# Patient Record
Sex: Female | Born: 1940 | Race: Black or African American | Hispanic: No | Smoking: Former smoker
Health system: Southern US, Community
[De-identification: ages and names within clinical notes are randomized; demographics above are authoritative.]

## PROBLEM LIST (undated history)

## (undated) DIAGNOSIS — E7800 Pure hypercholesterolemia, unspecified: Secondary | ICD-10-CM

## (undated) DIAGNOSIS — B3781 Candidal esophagitis: Secondary | ICD-10-CM

## (undated) DIAGNOSIS — I219 Acute myocardial infarction, unspecified: Secondary | ICD-10-CM

## (undated) DIAGNOSIS — M179 Osteoarthritis of knee, unspecified: Secondary | ICD-10-CM

## (undated) DIAGNOSIS — E119 Type 2 diabetes mellitus without complications: Secondary | ICD-10-CM

## (undated) DIAGNOSIS — I739 Peripheral vascular disease, unspecified: Secondary | ICD-10-CM

## (undated) DIAGNOSIS — K5790 Diverticulosis of intestine, part unspecified, without perforation or abscess without bleeding: Secondary | ICD-10-CM

## (undated) DIAGNOSIS — F419 Anxiety disorder, unspecified: Secondary | ICD-10-CM

## (undated) DIAGNOSIS — I779 Disorder of arteries and arterioles, unspecified: Secondary | ICD-10-CM

## (undated) DIAGNOSIS — D509 Iron deficiency anemia, unspecified: Secondary | ICD-10-CM

## (undated) DIAGNOSIS — K2270 Barrett's esophagus without dysplasia: Secondary | ICD-10-CM

## (undated) DIAGNOSIS — IMO0001 Reserved for inherently not codable concepts without codable children: Secondary | ICD-10-CM

## (undated) DIAGNOSIS — L409 Psoriasis, unspecified: Secondary | ICD-10-CM

## (undated) DIAGNOSIS — M459 Ankylosing spondylitis of unspecified sites in spine: Secondary | ICD-10-CM

## (undated) DIAGNOSIS — I998 Other disorder of circulatory system: Secondary | ICD-10-CM

## (undated) DIAGNOSIS — M1710 Unilateral primary osteoarthritis, unspecified knee: Secondary | ICD-10-CM

## (undated) DIAGNOSIS — Z5189 Encounter for other specified aftercare: Secondary | ICD-10-CM

## (undated) DIAGNOSIS — K2289 Other specified disease of esophagus: Secondary | ICD-10-CM

## (undated) DIAGNOSIS — Z720 Tobacco use: Secondary | ICD-10-CM

## (undated) DIAGNOSIS — R0600 Dyspnea, unspecified: Secondary | ICD-10-CM

## (undated) DIAGNOSIS — J42 Unspecified chronic bronchitis: Secondary | ICD-10-CM

## (undated) DIAGNOSIS — I2510 Atherosclerotic heart disease of native coronary artery without angina pectoris: Secondary | ICD-10-CM

## (undated) DIAGNOSIS — B351 Tinea unguium: Secondary | ICD-10-CM

## (undated) DIAGNOSIS — G8929 Other chronic pain: Secondary | ICD-10-CM

## (undated) DIAGNOSIS — K644 Residual hemorrhoidal skin tags: Secondary | ICD-10-CM

## (undated) DIAGNOSIS — Z9289 Personal history of other medical treatment: Secondary | ICD-10-CM

## (undated) DIAGNOSIS — K219 Gastro-esophageal reflux disease without esophagitis: Secondary | ICD-10-CM

## (undated) DIAGNOSIS — J449 Chronic obstructive pulmonary disease, unspecified: Secondary | ICD-10-CM

## (undated) DIAGNOSIS — K922 Gastrointestinal hemorrhage, unspecified: Secondary | ICD-10-CM

## (undated) DIAGNOSIS — I499 Cardiac arrhythmia, unspecified: Secondary | ICD-10-CM

## (undated) DIAGNOSIS — K449 Diaphragmatic hernia without obstruction or gangrene: Secondary | ICD-10-CM

## (undated) DIAGNOSIS — M1990 Unspecified osteoarthritis, unspecified site: Secondary | ICD-10-CM

## (undated) DIAGNOSIS — L97509 Non-pressure chronic ulcer of other part of unspecified foot with unspecified severity: Secondary | ICD-10-CM

## (undated) DIAGNOSIS — I313 Pericardial effusion (noninflammatory): Secondary | ICD-10-CM

## (undated) DIAGNOSIS — M549 Dorsalgia, unspecified: Secondary | ICD-10-CM

## (undated) DIAGNOSIS — G5601 Carpal tunnel syndrome, right upper limb: Secondary | ICD-10-CM

## (undated) DIAGNOSIS — R011 Cardiac murmur, unspecified: Secondary | ICD-10-CM

## (undated) DIAGNOSIS — L4050 Arthropathic psoriasis, unspecified: Secondary | ICD-10-CM

## (undated) DIAGNOSIS — M109 Gout, unspecified: Secondary | ICD-10-CM

## (undated) DIAGNOSIS — K228 Other specified diseases of esophagus: Secondary | ICD-10-CM

## (undated) DIAGNOSIS — I3139 Other pericardial effusion (noninflammatory): Secondary | ICD-10-CM

## (undated) DIAGNOSIS — D638 Anemia in other chronic diseases classified elsewhere: Principal | ICD-10-CM

## (undated) DIAGNOSIS — R51 Headache: Secondary | ICD-10-CM

## (undated) DIAGNOSIS — J189 Pneumonia, unspecified organism: Secondary | ICD-10-CM

## (undated) DIAGNOSIS — R0609 Other forms of dyspnea: Secondary | ICD-10-CM

## (undated) DIAGNOSIS — I10 Essential (primary) hypertension: Secondary | ICD-10-CM

## (undated) DIAGNOSIS — R519 Headache, unspecified: Secondary | ICD-10-CM

## (undated) DIAGNOSIS — K7460 Unspecified cirrhosis of liver: Secondary | ICD-10-CM

## (undated) DIAGNOSIS — J329 Chronic sinusitis, unspecified: Secondary | ICD-10-CM

## (undated) HISTORY — DX: Disorder of arteries and arterioles, unspecified: I77.9

## (undated) HISTORY — DX: Diverticulosis of intestine, part unspecified, without perforation or abscess without bleeding: K57.90

## (undated) HISTORY — PX: OOPHORECTOMY: SHX86

## (undated) HISTORY — PX: EYE SURGERY: SHX253

## (undated) HISTORY — DX: Pericardial effusion (noninflammatory): I31.3

## (undated) HISTORY — DX: Gastrointestinal hemorrhage, unspecified: K92.2

## (undated) HISTORY — DX: Unspecified cirrhosis of liver: K74.60

## (undated) HISTORY — PX: APPENDECTOMY: SHX54

## (undated) HISTORY — PX: CORONARY ANGIOPLASTY: SHX604

## (undated) HISTORY — DX: Personal history of other medical treatment: Z92.89

## (undated) HISTORY — DX: Anemia in other chronic diseases classified elsewhere: D63.8

## (undated) HISTORY — DX: Peripheral vascular disease, unspecified: I73.9

## (undated) HISTORY — DX: Candidal esophagitis: B37.81

## (undated) HISTORY — PX: CATARACT EXTRACTION W/ INTRAOCULAR LENS IMPLANT: SHX1309

## (undated) HISTORY — DX: Tinea unguium: B35.1

## (undated) HISTORY — DX: Other pericardial effusion (noninflammatory): I31.39

---

## 1974-11-28 HISTORY — PX: ABDOMINAL HYSTERECTOMY: SHX81

## 1998-07-07 ENCOUNTER — Ambulatory Visit (HOSPITAL_COMMUNITY): Admission: RE | Admit: 1998-07-07 | Discharge: 1998-07-07 | Payer: Self-pay | Admitting: Dermatology

## 1998-10-02 ENCOUNTER — Ambulatory Visit (HOSPITAL_COMMUNITY): Admission: RE | Admit: 1998-10-02 | Discharge: 1998-10-02 | Payer: Self-pay | Admitting: Pulmonary Disease

## 1998-12-10 ENCOUNTER — Inpatient Hospital Stay (HOSPITAL_COMMUNITY): Admission: EM | Admit: 1998-12-10 | Discharge: 1998-12-16 | Payer: Self-pay | Admitting: Emergency Medicine

## 1998-12-10 ENCOUNTER — Encounter: Payer: Self-pay | Admitting: Pulmonary Disease

## 1998-12-12 ENCOUNTER — Encounter: Payer: Self-pay | Admitting: Pulmonary Disease

## 1998-12-16 ENCOUNTER — Encounter: Payer: Self-pay | Admitting: Pulmonary Disease

## 1999-01-06 ENCOUNTER — Ambulatory Visit (HOSPITAL_COMMUNITY): Admission: RE | Admit: 1999-01-06 | Discharge: 1999-01-06 | Payer: Self-pay | Admitting: Gastroenterology

## 1999-01-06 ENCOUNTER — Encounter: Payer: Self-pay | Admitting: Gastroenterology

## 1999-01-26 ENCOUNTER — Ambulatory Visit (HOSPITAL_COMMUNITY): Admission: RE | Admit: 1999-01-26 | Discharge: 1999-01-26 | Payer: Self-pay | Admitting: Pulmonary Disease

## 2000-07-27 ENCOUNTER — Encounter: Payer: Self-pay | Admitting: Pulmonary Disease

## 2000-07-27 ENCOUNTER — Ambulatory Visit (HOSPITAL_COMMUNITY): Admission: RE | Admit: 2000-07-27 | Discharge: 2000-07-27 | Payer: Self-pay | Admitting: Pulmonary Disease

## 2000-09-28 ENCOUNTER — Ambulatory Visit (HOSPITAL_COMMUNITY): Admission: RE | Admit: 2000-09-28 | Discharge: 2000-09-28 | Payer: Self-pay | Admitting: Pulmonary Disease

## 2000-12-19 ENCOUNTER — Ambulatory Visit (HOSPITAL_COMMUNITY): Admission: RE | Admit: 2000-12-19 | Discharge: 2000-12-19 | Payer: Self-pay | Admitting: Pulmonary Disease

## 2000-12-19 ENCOUNTER — Encounter: Payer: Self-pay | Admitting: Pulmonary Disease

## 2001-02-13 ENCOUNTER — Encounter: Payer: Self-pay | Admitting: Pulmonary Disease

## 2001-02-13 ENCOUNTER — Ambulatory Visit (HOSPITAL_COMMUNITY): Admission: RE | Admit: 2001-02-13 | Discharge: 2001-02-13 | Payer: Self-pay | Admitting: Pulmonary Disease

## 2003-10-27 ENCOUNTER — Ambulatory Visit (HOSPITAL_COMMUNITY): Admission: RE | Admit: 2003-10-27 | Discharge: 2003-10-27 | Payer: Self-pay | Admitting: Pulmonary Disease

## 2005-11-28 HISTORY — PX: COLONOSCOPY: SHX174

## 2006-06-23 ENCOUNTER — Ambulatory Visit (HOSPITAL_COMMUNITY): Admission: RE | Admit: 2006-06-23 | Discharge: 2006-06-23 | Payer: Self-pay | Admitting: Pulmonary Disease

## 2006-08-13 ENCOUNTER — Inpatient Hospital Stay (HOSPITAL_COMMUNITY): Admission: EM | Admit: 2006-08-13 | Discharge: 2006-08-17 | Payer: Self-pay | Admitting: *Deleted

## 2006-09-13 ENCOUNTER — Encounter: Admission: RE | Admit: 2006-09-13 | Discharge: 2006-09-13 | Payer: Self-pay | Admitting: Cardiology

## 2006-09-13 ENCOUNTER — Ambulatory Visit (HOSPITAL_COMMUNITY): Admission: RE | Admit: 2006-09-13 | Discharge: 2006-09-13 | Payer: Self-pay | Admitting: Nephrology

## 2006-10-03 ENCOUNTER — Ambulatory Visit: Payer: Self-pay | Admitting: Gastroenterology

## 2006-10-10 ENCOUNTER — Other Ambulatory Visit: Admission: RE | Admit: 2006-10-10 | Discharge: 2006-10-10 | Payer: Self-pay | Admitting: Obstetrics and Gynecology

## 2006-11-01 ENCOUNTER — Ambulatory Visit: Payer: Self-pay | Admitting: Gastroenterology

## 2006-11-01 ENCOUNTER — Encounter (INDEPENDENT_AMBULATORY_CARE_PROVIDER_SITE_OTHER): Payer: Self-pay | Admitting: *Deleted

## 2006-12-05 ENCOUNTER — Ambulatory Visit: Payer: Self-pay | Admitting: Gastroenterology

## 2007-01-31 ENCOUNTER — Ambulatory Visit (HOSPITAL_COMMUNITY): Admission: RE | Admit: 2007-01-31 | Discharge: 2007-01-31 | Payer: Self-pay | Admitting: Pulmonary Disease

## 2007-11-16 ENCOUNTER — Ambulatory Visit (HOSPITAL_COMMUNITY): Admission: RE | Admit: 2007-11-16 | Discharge: 2007-11-16 | Payer: Self-pay | Admitting: Ophthalmology

## 2008-01-25 ENCOUNTER — Encounter (INDEPENDENT_AMBULATORY_CARE_PROVIDER_SITE_OTHER): Payer: Self-pay | Admitting: Surgery

## 2008-01-25 ENCOUNTER — Ambulatory Visit (HOSPITAL_COMMUNITY): Admission: RE | Admit: 2008-01-25 | Discharge: 2008-01-25 | Payer: Self-pay | Admitting: Surgery

## 2008-04-28 ENCOUNTER — Encounter: Payer: Self-pay | Admitting: Ophthalmology

## 2008-05-07 ENCOUNTER — Ambulatory Visit (HOSPITAL_COMMUNITY): Admission: RE | Admit: 2008-05-07 | Discharge: 2008-05-08 | Payer: Self-pay | Admitting: Ophthalmology

## 2008-11-24 ENCOUNTER — Encounter: Payer: Self-pay | Admitting: Gastroenterology

## 2008-11-28 HISTORY — PX: LIPOMA EXCISION: SHX5283

## 2008-12-11 ENCOUNTER — Encounter: Payer: Self-pay | Admitting: Endocrinology

## 2009-03-23 ENCOUNTER — Ambulatory Visit (HOSPITAL_COMMUNITY): Admission: RE | Admit: 2009-03-23 | Discharge: 2009-03-23 | Payer: Self-pay | Admitting: Ophthalmology

## 2009-05-19 ENCOUNTER — Encounter: Payer: Self-pay | Admitting: Endocrinology

## 2009-05-21 ENCOUNTER — Ambulatory Visit (HOSPITAL_COMMUNITY): Admission: RE | Admit: 2009-05-21 | Discharge: 2009-05-21 | Payer: Self-pay | Admitting: Pulmonary Disease

## 2009-06-11 ENCOUNTER — Ambulatory Visit (HOSPITAL_COMMUNITY): Admission: RE | Admit: 2009-06-11 | Discharge: 2009-06-11 | Payer: Self-pay | Admitting: Pulmonary Disease

## 2009-07-24 ENCOUNTER — Encounter: Admission: RE | Admit: 2009-07-24 | Discharge: 2009-07-24 | Payer: Self-pay | Admitting: Otolaryngology

## 2009-08-05 ENCOUNTER — Ambulatory Visit: Payer: Self-pay | Admitting: Endocrinology

## 2009-08-05 DIAGNOSIS — K219 Gastro-esophageal reflux disease without esophagitis: Secondary | ICD-10-CM

## 2009-08-05 DIAGNOSIS — E119 Type 2 diabetes mellitus without complications: Secondary | ICD-10-CM

## 2009-08-05 DIAGNOSIS — E079 Disorder of thyroid, unspecified: Secondary | ICD-10-CM | POA: Insufficient documentation

## 2009-08-05 DIAGNOSIS — R011 Cardiac murmur, unspecified: Secondary | ICD-10-CM

## 2009-08-05 DIAGNOSIS — L408 Other psoriasis: Secondary | ICD-10-CM

## 2009-08-05 DIAGNOSIS — R209 Unspecified disturbances of skin sensation: Secondary | ICD-10-CM | POA: Insufficient documentation

## 2009-08-05 DIAGNOSIS — E279 Disorder of adrenal gland, unspecified: Secondary | ICD-10-CM | POA: Insufficient documentation

## 2009-08-05 LAB — CONVERTED CEMR LAB
Folate: 11.3 ng/mL
Free T4: 0.6 ng/dL (ref 0.6–1.6)
Hgb A1c MFr Bld: 6.7 % — ABNORMAL HIGH (ref 4.6–6.5)
T3, Free: 3.2 pg/mL (ref 2.3–4.2)
TSH: 0.78 microintl units/mL (ref 0.35–5.50)
Vitamin B-12: 911 pg/mL (ref 211–911)

## 2009-09-17 ENCOUNTER — Encounter: Admission: RE | Admit: 2009-09-17 | Discharge: 2009-09-17 | Payer: Self-pay | Admitting: Otolaryngology

## 2009-09-25 ENCOUNTER — Encounter: Admission: RE | Admit: 2009-09-25 | Discharge: 2009-09-25 | Payer: Self-pay | Admitting: Otolaryngology

## 2009-09-29 ENCOUNTER — Encounter (INDEPENDENT_AMBULATORY_CARE_PROVIDER_SITE_OTHER): Payer: Self-pay | Admitting: Otolaryngology

## 2009-09-29 ENCOUNTER — Ambulatory Visit (HOSPITAL_BASED_OUTPATIENT_CLINIC_OR_DEPARTMENT_OTHER): Admission: RE | Admit: 2009-09-29 | Discharge: 2009-09-29 | Payer: Self-pay | Admitting: Otolaryngology

## 2009-09-29 HISTORY — PX: NASAL SINUS SURGERY: SHX719

## 2009-10-01 ENCOUNTER — Telehealth: Payer: Self-pay | Admitting: Gastroenterology

## 2009-10-09 ENCOUNTER — Telehealth (INDEPENDENT_AMBULATORY_CARE_PROVIDER_SITE_OTHER): Payer: Self-pay | Admitting: *Deleted

## 2009-11-19 ENCOUNTER — Ambulatory Visit (HOSPITAL_COMMUNITY): Admission: RE | Admit: 2009-11-19 | Discharge: 2009-11-19 | Payer: Self-pay | Admitting: Pulmonary Disease

## 2009-11-25 ENCOUNTER — Encounter: Payer: Self-pay | Admitting: Internal Medicine

## 2009-11-25 ENCOUNTER — Ambulatory Visit (HOSPITAL_COMMUNITY): Admission: RE | Admit: 2009-11-25 | Discharge: 2009-11-25 | Payer: Self-pay | Admitting: Pulmonary Disease

## 2009-11-28 HISTORY — PX: ESOPHAGOGASTRODUODENOSCOPY: SHX1529

## 2009-11-28 HISTORY — PX: CARPAL TUNNEL RELEASE: SHX101

## 2010-02-08 ENCOUNTER — Encounter: Payer: Self-pay | Admitting: Gastroenterology

## 2010-02-09 ENCOUNTER — Telehealth: Payer: Self-pay | Admitting: Gastroenterology

## 2010-02-17 ENCOUNTER — Ambulatory Visit: Payer: Self-pay | Admitting: Gastroenterology

## 2010-02-17 ENCOUNTER — Encounter (INDEPENDENT_AMBULATORY_CARE_PROVIDER_SITE_OTHER): Payer: Self-pay | Admitting: *Deleted

## 2010-02-17 DIAGNOSIS — R1310 Dysphagia, unspecified: Secondary | ICD-10-CM | POA: Insufficient documentation

## 2010-02-17 DIAGNOSIS — D509 Iron deficiency anemia, unspecified: Secondary | ICD-10-CM | POA: Insufficient documentation

## 2010-02-17 LAB — CONVERTED CEMR LAB
Basophils Absolute: 0 K/uL
Basophils Relative: 0.1 %
Eosinophils Absolute: 0.1 K/uL
Eosinophils Relative: 2.1 %
Ferritin: 13.2 ng/mL
Folate: 6.3 ng/mL
HCT: 34.6 % — ABNORMAL LOW
Hemoglobin: 11.1 g/dL — ABNORMAL LOW
Iron: 49 ug/dL
Lymphocytes Relative: 16 %
Lymphs Abs: 0.9 K/uL
MCHC: 32.1 g/dL
MCV: 87.8 fL
Monocytes Absolute: 0.4 K/uL
Monocytes Relative: 6.1 %
Neutro Abs: 4.4 K/uL
Neutrophils Relative %: 75.7 %
Platelets: 176 K/uL
RBC: 3.94 M/uL
RDW: 20.1 % — ABNORMAL HIGH
Saturation Ratios: 11.7 % — ABNORMAL LOW
Transferrin: 298.8 mg/dL
Vitamin B-12: 437 pg/mL
WBC: 5.8 10*3/microliter

## 2010-02-19 ENCOUNTER — Ambulatory Visit: Payer: Self-pay | Admitting: Gastroenterology

## 2010-02-23 ENCOUNTER — Telehealth: Payer: Self-pay | Admitting: Gastroenterology

## 2010-03-08 ENCOUNTER — Ambulatory Visit: Payer: Self-pay | Admitting: Gastroenterology

## 2010-03-08 LAB — CONVERTED CEMR LAB: Fecal Occult Bld: NEGATIVE

## 2010-03-31 ENCOUNTER — Telehealth: Payer: Self-pay | Admitting: Gastroenterology

## 2010-04-16 ENCOUNTER — Encounter: Admission: RE | Admit: 2010-04-16 | Discharge: 2010-04-16 | Payer: Self-pay | Admitting: Otolaryngology

## 2010-04-27 ENCOUNTER — Ambulatory Visit: Payer: Self-pay | Admitting: Gastroenterology

## 2010-04-27 ENCOUNTER — Encounter (INDEPENDENT_AMBULATORY_CARE_PROVIDER_SITE_OTHER): Payer: Self-pay | Admitting: *Deleted

## 2010-04-27 DIAGNOSIS — J449 Chronic obstructive pulmonary disease, unspecified: Secondary | ICD-10-CM | POA: Insufficient documentation

## 2010-04-27 DIAGNOSIS — J4489 Other specified chronic obstructive pulmonary disease: Secondary | ICD-10-CM | POA: Insufficient documentation

## 2010-04-30 ENCOUNTER — Ambulatory Visit: Payer: Self-pay | Admitting: Internal Medicine

## 2010-04-30 DIAGNOSIS — I10 Essential (primary) hypertension: Secondary | ICD-10-CM

## 2010-04-30 DIAGNOSIS — J45909 Unspecified asthma, uncomplicated: Secondary | ICD-10-CM | POA: Insufficient documentation

## 2010-05-05 ENCOUNTER — Ambulatory Visit: Payer: Self-pay | Admitting: Gastroenterology

## 2010-05-17 ENCOUNTER — Encounter: Payer: Self-pay | Admitting: Gastroenterology

## 2010-05-25 ENCOUNTER — Ambulatory Visit: Payer: Self-pay | Admitting: Gastroenterology

## 2010-05-26 LAB — CONVERTED CEMR LAB
Basophils Relative: 0.4 % (ref 0.0–3.0)
Eosinophils Absolute: 0.3 10*3/uL (ref 0.0–0.7)
Folate: 13.8 ng/mL
Iron: 130 ug/dL (ref 42–145)
Lymphocytes Relative: 27.2 % (ref 12.0–46.0)
MCHC: 33.4 g/dL (ref 30.0–36.0)
MCV: 96.6 fL (ref 78.0–100.0)
Monocytes Absolute: 0.9 10*3/uL (ref 0.1–1.0)
Neutrophils Relative %: 56.5 % (ref 43.0–77.0)
Platelets: 157 10*3/uL (ref 150.0–400.0)
RBC: 4.27 M/uL (ref 3.87–5.11)
Transferrin: 226.4 mg/dL (ref 212.0–360.0)
WBC: 7.9 10*3/uL (ref 4.5–10.5)

## 2010-09-14 ENCOUNTER — Ambulatory Visit (HOSPITAL_COMMUNITY): Admission: RE | Admit: 2010-09-14 | Discharge: 2010-09-14 | Payer: Self-pay | Admitting: Pulmonary Disease

## 2010-12-30 NOTE — Assessment & Plan Note (Signed)
Summary: Pulmonary/ copd eval > try off ace then return for pfts   Visit Type:  Initial Consult Copy to:  Dr. Rolanda Jay Primary Provider/Referring Provider:  Corine Shelter, MD  CC:  Dyspnea.  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  April 30, 2010 cc episodes of throat tightness esp since Mother's day assoc with dysphagia and sore throat started after sinus surgery November 2010 but gradually worsening in terms of severity and frequency so saw Dr Jarold Motto who rec egd and pulmonary eval. She has minimal sputum production and more day than night time events.  some better after uses neb but not consisently. Pt denies any significant   itching, sneezing,  nasal congestion or excess/purulent secretions,  fever, chills, sweats, unintended wt loss, pleuritic or exertional cp, hempoptysis, change in activity tolerance  orthopnea pnd or leg swelling. Pt also denies any obvious fluctuation in symptoms with weather or environmental change or other alleviating or aggravating factors.       Current Medications (verified): 1)  Flonase 50 Mcg/act Susp (Fluticasone Propionate) .... As Needed 2)  Theophylline Cr 200 Mg Xr12h-Tab (Theophylline) .Marland Kitchen.. 1 Tab Two Times A Day 3)  Glucovance 2.5-500 Mg Tabs (Glyburide-Metformin) .Marland Kitchen.. 1 Tab Two Times A Day As Needed On Hold Until Bs Increases 4)  Lotrel 10-40 Mg Caps (Amlodipine Besy-Benazepril Hcl) .Marland Kitchen.. 1 Cap Every Evening 5)  Benicar Hct 40-12.5 Mg Tabs (Olmesartan Medoxomil-Hctz) .Marland Kitchen.. 1 Tab Every Morning 6)  Prednisone 5 Mg Tabs (Prednisone) .Marland Kitchen.. 1 Tab On M,w,fri 7)  Travatan 0.004 % Soln (Travoprost) .... Daily 8)  Premarin 0.3 Mg Tabs (Estrogens Conjugated) .Marland Kitchen.. 1 Tab Daily 9)  Nexium 40 Mg Cpdr (Esomeprazole Magnesium) .Marland Kitchen.. 1 Tab Every Morning  30 Minutes Before Breakfast 10)  Advair Diskus 250-50 Mcg/dose Aepb (Fluticasone-Salmeterol) .Marland Kitchen.. 1 Puff Two Times A Day 11)  Ranitidine Hcl 150 Mg Caps (Ranitidine Hcl) .... Take 1 Capsule By Mouth Once A Day 12)  Aspirin 81 Mg Tbec (Aspirin) .Marland Kitchen.. 1 Tab By Mouth Once Daily 13)  Humira 40 Mg/0.37ml Kit (Adalimumab) .... Take Every 2 Weeks 14)  Icaps  Caps (Multiple Vitamins-Minerals) .Marland Kitchen.. 1 By Mouth Two Times A Day 15)  Acular 0.5 % Soln (Ketorolac Tromethamine) .... Use As Directed 16)  Albuterol Sulfate (2.5 Mg/35ml) 0.083% Nebu (Albuterol Sulfate) .... Use As Needed 17)  Tandem 162-115.2 Mg Caps (Ferrous Fum-Iron Polysacch) .... Take 1 Capsule By Mouth Once A Day  Allergies (verified): No Known Drug Allergies  Past History:  Past Medical History: HEART MURMUR (ICD-785.2) GERD (ICD-530.81) DIABETES-TYPE 2 (ICD-250.00) Hypertension     - Try off ACE April 30, 2010  Asthma/COPD    - PFT's rec April 30, 2010   Past Surgical History: hysterectomy-1976 appendectomy- 1976 Eye surgery  implant lt. eye and rt. eye occipital nodule removal Cyst in upper rt. eye 09/2009  Family History: sister has uncertain type of thyroid dz No FH of Colon Cancer: Sinus Cancer-mother Negative for respiratory diseases or atopy   Social History: widowed 1995 retired Engineer, civil (consulting) Former smoker.  Quit in March 2011.  Smoked for 55 yrs up to 1/2 ppd. Alcohol Use - no Daily Caffeine Use  Review of Systems       The patient complains of shortness of breath with activity, productive  cough, acid heartburn, indigestion, loss of appetite, difficulty swallowing, sore throat, tooth/dental problems, nasal congestion/difficulty breathing through nose, sneezing, itching, ear ache, anxiety, hand/feet swelling, and joint stiffness or pain.  The patient denies shortness of breath at rest, non-productive cough, coughing up blood, chest pain, irregular heartbeats, weight change, abdominal pain, headaches, depression, rash,  change in color of mucus, and fever.    Vital Signs:  Patient profile:   70 year old female Weight:      188 pounds O2 Sat:      96 % on Room air Temp:     98.2 degrees F oral Pulse rate:   77 / minute BP sitting:   130 / 74  (left arm)  Vitals Entered By: Vernie Murders (April 30, 2010 10:02 AM)  O2 Flow:  Room air  Physical Exam  Additional Exam:  wt 187 > 188 April 30, 2010 amb hoarse bf with classic pseudowheeze resolves with purse lip maneuver HEENT mild turbinate edema.  Oropharynx no thrush or excess pnd or cobblestoning.  No JVD or cervical adenopathy. Mild accessory muscle hypertrophy. Trachea midline, nl thryroid. Chest was hyperinflated by percussion with diminished breath sounds and moderate increased exp time without wheeze. Hoover sign positive at mid inspiration. Regular rate and rhythm without murmur gallop or rub or increase P2 or edema.  Abd: no hsm, nl excursion. Ext warm without cyanosis or clubbing.     Impression & Recommendations:  Problem # 1:  COPD (ICD-496) ? severity, pft's needed DDX of  difficult airways managment all start with A and  include Adherence, Ace Inhibitors, Acid Reflux, Active Sinus Disease, Alpha 1 Antitripsin deficiency, Anxiety masquerading as Airways dz,  ABPA,  allergy(esp in young), Aspiration (esp in elderly), Adverse effects of DPI,  Active smokers, plus one B  = Beta blocker use..   Acid reflux and Ace inhibitors in pts like this can be like gasoline and matches.  Rec trial off ace then also consider consolidate rx to spriva and no theoph or advair once returns for baselline pfts'   Each maintenance medication was reviewed in detail including most importantly the difference between maintenance and as needed and under what circumstances the prns are to be used.   Problem # 2:  HYPERTENSION (ICD-401.9)  The following medications were removed from the medication list:    Lotrel 10-40 Mg Caps (Amlodipine besy-benazepril hcl) .Marland Kitchen... 1 cap  every evening Her updated medication list for this problem includes:    Benicar Hct 40-12.5 Mg Tabs (Olmesartan medoxomil-hctz) .Marland Kitchen... 1 tab every morning   ACE inhibitors are problematic in  pts with airway complaints because  even experienced pulmonologists can't always distinguish ace effects from copd/asthma.  By themselves they don't actually cause a problem, much like oxygen can't by itself start a fire, but they certainly serve as a powerful catalyst or enhancer for any "fire"  or inflammatory process in the upper airway, be it caused by an ET  tube or more commonly reflux (especially in the obese or pts with known GERD or who are on biphoshonates).  In the era of ARB near equivalency until we have a better handle on the reversibility of the airway problem, it just makes sense to avoid ace entirely in the short run and then decide later, having established a level of airway control using a reasonable limited regimen, whether to add back ace but even then being very careful to observe the pt for worsening airway control and number of  meds used/ needed to control symptoms.    Orders: New Patient Level V (04540)  Patient Instructions: 1)  Stop Lotrel - if blood pressure too high take the benicar twice daily 2)  Return in 4 weeks for PFT's and cxr

## 2010-12-30 NOTE — Progress Notes (Signed)
Summary: results   Phone Note Call from Patient Call back at Home Phone [phone]   Caller: Patient Call For: Jarold Motto Reason for Call: Talk to Nurse, Lab or Test Results Summary of Call: Patient wants lab results Initial call taken by: Tawni Levy,  Mar 31, 2010 9:03 AM  Follow-up for Phone Call        Pt notified of negative hemacult. Follow-up by: Ashok Cordia RN,  Mar 31, 2010 10:03 AM

## 2010-12-30 NOTE — Procedures (Signed)
Summary: Upper Endoscopy  Patient: [name] Note: All result statuses are Final unless otherwise noted.  Tests: (1) Upper Endoscopy (EGD)   EGD Upper Endoscopy       DONE     [facility]     [address]     [place], [place]  [address]           ENDOSCOPY PROCEDURE REPORT           PATIENT:  [name], [name]  MR#:  [id number]     BIRTHDATE:  [birth year], 69 yrs. old  GENDER:  female           ENDOSCOPIST:  Vania Rea. Jarold Motto, MD, [facility]     Referred by:           PROCEDURE DATE:  05/05/2010     PROCEDURE:  EGD with biopsy     ASA CLASS:  Class III     INDICATIONS:  dysphagia           MEDICATIONS:   Fentanyl 50 mcg IV, Versed 5 mg IV     TOPICAL ANESTHETIC:  Exactacain Spray           DESCRIPTION OF PROCEDURE:   After the risks benefits and     alternatives of the procedure were thoroughly explained, informed     consent was obtained.  [facility] GIF-H180 [id number] endoscope was     introduced through the mouth and advanced to the second portion of     the duodenum, without limitations.  The instrument was slowly     withdrawn as the mucosa was fully examined.     <<PROCEDUREIMAGES>>           Esophagitis was found. DENSE CANDIDA PLAQUES IN SISTAL HALF OF     ESOPHAGUS BIOPSIED.BACKGROUND BARRETT'S MUCOSA AND ALSO NOTED ARE     2 INLET POUCH AREAS IN UPPER 1/3.SEE PICTURES.  A hiatal hernia     was found.  Normal duodenal folds were noted.  The stomach was     entered and closely examined. The antrum, angularis, and lesser     curvature were well visualized, including a retroflexed view of     the cardia and fundus. The stomach wall was normally distensable.     The scope passed easily through the pylorus into the duodenum.     Retroflexed views revealed a hiatal hernia.    The scope was then     withdrawn from the patient and the procedure completed.           COMPLICATIONS:  None           ENDOSCOPIC IMPRESSION:     1) Esophagitis     2) Hiatal hernia  3) Normal duodenal folds     4) Normal stomach     5) A hiatal hernia     SEVERE CANDIDA ESOPHAGITIS     RECOMMENDATIONS:     1) Await biopsy results     DIFLUCAN 100 MG BID FOR 1 DAY,THEN DAILY FOR 2 WEEKS.HOLD     NEXIUM.SEE ME 3 WEEKS.           REPEAT EXAM:  No           ______________________________     Vania Rea. Jarold Motto, MD, [facility]           CC:  Corine Shelter MD           n.     Rosalie DoctorOnalee Hua  Hale Bogus at 05/05/2010 02:33 PM           Genella Rife, [id number]  Note: An exclamation mark (!) indicates a result that was not dispersed into the flowsheet. Document Creation Date: 05/05/2010 2:34 PM _______________________________________________________________________  (1) Order result status: Final Collection or observation date-time: 05/05/2010 14:15 Requested date-time:  Receipt date-time:  Reported date-time:  Referring Physician:   Ordering Physician: Sheryn Bison [phone]) Specimen Source:  Source: Launa Grill Order Number: [id number] Lab site:

## 2010-12-30 NOTE — Letter (Signed)
Summary: Patient [facility] Biopsy Results  [facility]  [address] [place], [place] [address]   Phone: [phone]  Fax: [phone]        May 17, 2010 MRN: [id number]    [facility] [address] [place], [place]  [address]    Dear Ms. Summerall,  I am pleased to inform you that the biopsies taken during your recent endoscopic examination did not show any evidence of cancer upon pathologic examination.  Additional information/recommendations:  __No further action is needed at this time.  Please follow-up with      your primary care physician for your other healthcare needs.  __ Please call [phone] to schedule a return visit to review      your condition.  xx__ Continue with the treatment plan as outlined on the day of your      exam.  __ You should have a repeat endoscopic examination for this problem              in _ months/years.   Please call us if you are having persistent problems or have questions about your condition that have not been fully answered at this time.  Sincerely,  Mardella Layman MD [facility]  This letter has been electronically signed by your physician.  Appended Document: Patient Notice-Endo Biopsy Results letter mailed.

## 2010-12-30 NOTE — Miscellaneous (Signed)
Summary: gi med  Clinical Lists Changes  Medications: Added new medication of DIFLUCAN 100 MG  TABS (FLUCONAZOLE) two times a day for one day, then daily for 3 weeks. - Signed Rx of DIFLUCAN 100 MG  TABS (FLUCONAZOLE) two times a day for one day, then daily for 3 weeks.;  #23 x 0;  Signed;  Entered by: Eual Fines RN;  Authorized by: Mardella Layman MD [facility];  Method used: [facility] [address] [phone]*, [address] [place], [place], [place]  [id number], Ph: [phone] or [phone], Fax: [phone] Observations: Added new observation of NKA: T (05/05/2010 14:44)    Prescriptions: DIFLUCAN 100 MG  TABS (FLUCONAZOLE) two times a day for one day, then daily for 3 weeks.  #23 x 0   Entered by:   Eual Fines RN   Authorized by:   Mardella Layman MD [facility]   Signed by:   Eual Fines RN on 05/05/2010   Method used:   [facility] [phone]* (retail)       [address]       [place], [place]  [id number]       Ph: [phone] or [phone]       Fax: [phone]   RxID:   [id number]

## 2010-12-30 NOTE — Procedures (Signed)
Summary:  EGD and Path   EGD  Procedure date:  11/01/2006  Findings:      Location: [facility]    EGD  Procedure date:  11/01/2006  Findings:      Location: [facility]   Patient Name: [name], Shelby C. MRN:  Procedure Procedures: Panendoscopy (EGD) CPT: 43235.    with esophageal dilation. CPT: G9296129.  Personnel: Endoscopist: Vania Rea. Jarold Motto, MD.  Exam Location: Exam performed in Outpatient Clinic. Outpatient  Patient Consent: Procedure, Alternatives, Risks and Benefits discussed, consent obtained, from patient. Consent was obtained by the RN.  Indications Symptoms: Dysphagia. Reflux symptoms  History  Current Medications: Patient is not currently taking Coumadin.  Medical/Surgical History: Adult Onset Diabetes, Hypertension, Psoriasis, COPD,  Pre-Exam Physical: Performed Nov 01, 2006  Cardio-pulmonary exam, Abdominal exam, Extremity exam, Mental status exam WNL.  Comments: Pt. history reviewed/updated, physical exam performed prior to initiation of sedation?yes Exam Exam Info: Maximum depth of insertion Duodenum, intended Duodenum. Patient position: on left side. Duration of exam: 10 minutes. Vocal cords visualized. Gastric retroflexion performed. Images taken. ASA Classification: III. Tolerance: excellent.  Sedation Meds: Patient assessed and found to be appropriate for moderate (conscious) sedation. Cetacaine Spray 2 sprays given aerosolized. Versed 4 mg. given IV.  Monitoring: BP and pulse monitoring done. Oximetry used. Supplemental O2 given at 2 Liters.  Instrument(s): GIF 160. Serial [id number].   Findings - HIATAL HERNIA: Prolapsing, 4 cms. in length. ICD9: Hernia, Hiatal: 553.3. - ESOPHAGEAL INFLAMMATION: as a result of candida. Length of inflammation: 8 cm. Biopsy/Esoph Inflamtn taken. ICD9: Esophagitis: 530.10.  - Dilation: Distal Esophagus. Maloney dilator used, Diameter: 54 F, No Resistance, No Heme  present on extraction. 1  total dilators used. Patient tolerance excellent. Outcome: successful.  - Normal: Fundus to Duodenal 2nd Portion. Not Seen: Tumor. Ulcer. Mucosal abnormality. AVM's. Foreign body. Polyp. Varices.   Assessment  Diagnoses: 530.10: Esophagitis.  553.3: Hernia, Hiatal.   Events  Unplanned Intervention: No unplanned interventions were required.  Plans Medication(s): Other: Diflucan 100mg . QAM, starting Nov 01, 2006 for 10 days.   Disposition: After procedure patient sent to recovery. After recovery patient sent home.  Scheduling: Await pathology to schedule patient. Follow-up prn.    cc: Corine Shelter, MD    [facility] Surgical Pathology - STATUS: Final             By: Gwenlyn Saran  ,        Perform Date: 5 Dec07 00:01  Ordered By: Juanetta Beets        Ordered Date: 6 Dec07 09:02  Facility: [facility]                               Department: CPATH  Service Report Text  [facility]   [address] [address]   [place], [place] [address]   Telephone [phone] or [phone] Fax [phone]    REPORT OF SURGICAL PATHOLOGY    Case #: [id number]   Patient Name: [name], Shelby C.   Office Chart Number: N/A    MRN: [id number]   Pathologist: Berneta Levins, MD   DOB/Age February 05, 1941 (Age: 70) Gender: F   Date Taken: 11/01/2006   Date Received: 11/01/2006    FINAL DIAGNOSIS    ***MICROSCOPIC EXAMINATION AND DIAGNOSIS***    ESOPHAGUS, BIOPSY: BENIGN SQUAMOUS MUCOSA WITH FOCAL   PARAKERATOSIS. NO INTESTINAL METAPLASIA OR EVIDENCE OF  MALIGNANCY IDENTIFIED.    COMMENT   An Alcian Blue stain is performed to determine the presence of   intestinal metaplasia (goblet cell metaplasia). No intestinal   metaplasia (goblet cell metaplasia) is identified with the Alcian   Blue stain. The control stained appropriately. There are strips   of benign squamous mucosa that are focally associated with   parakeratosis. No significant  inflammation is seen. There is no   atypia or evidence of malignancy.    A PAS stain for fungus is performed and is negative for fungal   organisms. The control stained appropriately. (JAS:caf   11/02/06)    cf   Date Reported: 11/03/2006 Berneta Levins, MD   *** Electronically Signed Out By JAS ***    Clinical information   Dysphagia R/O esophageal candida (jes)    specimen(s) obtained   Esophagus, biopsy    Gross Description   Received in formalin are tan, soft tissue fragments that are   submitted in toto. Number: multiple.   Size: 0.2 to 0.5 cm One block (MA:jes, 11/02/06)    jes/    This report was created from the original endoscopy report, which was reviewed and signed by the above listed endoscopist.

## 2010-12-30 NOTE — Assessment & Plan Note (Signed)
Summary: Dysphagia/dfs    History of Present Illness Visit Type: Follow-up Visit Primary GI MD: Sheryn Bison MD FACP FAGA Primary Provider: Dr Corine Shelter Chief Complaint: dysphagia both liquids and solids, alot of phlegm in throat History of Present Illness:   Pleasant but complicated 70 year old [place] American female who I follow for many years because of chronic acid reflux and recurrent atypical chest pain. 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 She previously been treated successfully for Candida esophagitis. She has been on chronic PPI therapy and denies typical reflux symptoms. 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 She complains of cough, sputum production, and recently had some dysphagia for solids and liquids which has pretty much resolved on oral Mycostatin given to her by her primary care physician. She relates that she has new onset of anemia although I cannot correlate distal reviewing her extensive records. I spent more than 30 minutes today with this patient discussing her multiple medical problems and issues. She denies lower gastrointestinal complaints or hepatobiliary problems, and is up-to-date on her colonoscopy procedures.   GI Review of Systems    Reports dysphagia with liquids and  dysphagia with solids.      Denies abdominal pain, acid reflux, belching, bloating, chest pain, heartburn, loss of appetite, nausea, vomiting, vomiting blood, weight loss, and  weight gain.        Denies anal fissure, black tarry stools, change in bowel habit, constipation, diarrhea, diverticulosis, fecal incontinence, heme positive stool, hemorrhoids, irritable bowel syndrome, jaundice, light color stool, liver problems, rectal bleeding, and  rectal pain. Preventive Screening-Counseling &  Management  Alcohol-Tobacco     Smoking Status: current    Current Medications (verified): 1)  Veramyst 27.5 Mcg/spray Susp (Fluticasone Furoate) .... Prn 2)  Theophylline Cr 200 Mg Xr12h-Tab (Theophylline) .Marland Kitchen.. 1 Tab Bid 3)  Glucovance 2.5-500 Mg Tabs (Glyburide-Metformin) .Marland Kitchen.. 1 Tab Two Times A Day As Needed On Hold Until Bs Increases 4)  Lotrel 10-40 Mg Caps (Amlodipine Besy-Benazepril Hcl) .Marland Kitchen.. 1 Cap Qpm 5)  Benicar Hct 40-12.5 Mg Tabs (Olmesartan Medoxomil-Hctz) .Marland Kitchen.. 1 Tab Qam 6)  Prednisone 5 Mg Tabs (Prednisone) .Marland Kitchen.. 1 Tab On M,w,fri 7)  Travatan 0.004 % Soln (Travoprost) .... Daily 8)  Premarin 0.3 Mg Tabs (Estrogens Conjugated) .Marland Kitchen.. 1 Tab Daily 9)  Nexium 40 Mg Cpdr (Esomeprazole Magnesium) .Marland Kitchen.. 1 Tab Q Am 10)  Advair Diskus 250-50 Mcg/dose Aepb (Fluticasone-Salmeterol) .Marland Kitchen.. 1 Puff Two Times A Day 11)  Ranitidine Hcl 150 Mg Caps (Ranitidine Hcl) .Marland Kitchen.. 1 Cap Qd 12)  Aspirin 81 Mg Tbec (Aspirin) .Marland Kitchen.. 1 Tab Qd 13)  Welchol 625 Mg Tabs (Colesevelam Hcl) .Marland Kitchen.. 1 Tablet By Mouth Once Daily 14)  Humira 40 Mg/0.18ml Kit (Adalimumab) .... Take Q 2 Weeks 15)  Icaps  Caps (Multiple Vitamins-Minerals) .Marland Kitchen.. 1 By Mouth Two Times A Day 16)  Acular 0.5 % Soln (Ketorolac Tromethamine) .... Use As Directed 17)  Albuterol Sulfate (2.5 Mg/65ml) 0.083% Nebu (Albuterol Sulfate) .... Use As Needed  Allergies (verified): No Known Drug Allergies  Past History:  Past medical, surgical, family and social histories (including risk factors) reviewed for relevance to current acute and chronic problems.  Past Medical History: Reviewed history from 08/05/2009 and no changes required. HEART MURMUR (ICD-785.2) GERD (ICD-530.81) DIABETES-TYPE 2 (ICD-250.00)  Past Surgical History: hysterectomy-1976 appendectomy-  1976 Eye surgery  implant lt. eye and rt. eye occipital nodule removal Cyst in upper rt. eye  Family History: Reviewed history from 08/05/2009 and no changes required. sister has uncertain  type of thyroid dz No FH of Colon Cancer: Sinus Cancer-mother  Social History: Reviewed history from 08/05/2009 and no changes required. 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 occasional smoker Alcohol Use - no Daily Caffeine Use Smoking Status:  current  Review of Systems       The patient complains of allergy/sinus, anemia, arthritis/joint pain, back pain, cough, fatigue, headaches-new, night sweats, shortness of breath, skin rash, sleeping problems, and swelling of feet/legs.  The patient denies anxiety-new, blood in urine, breast changes/lumps, change in vision, confusion, coughing up blood, depression-new, fainting, fever, hearing problems, heart murmur, heart rhythm changes, itching, menstrual pain, muscle pains/cramps, nosebleeds, pregnancy symptoms, sore throat, swollen lymph glands, thirst - excessive , urination - excessive , urination changes/pain, urine leakage, vision changes, and voice change.    Vital Signs:  Patient profile:   70 year old female Height:      65 inches Weight:      186.50 pounds BMI:     31.15 Pulse rate:   76 / minute Pulse rhythm:   regular BP sitting:   122 / 66  (left arm)  Vitals Entered By: Milford Cage NCMA (February 17, 2010 11:10 AM)  Physical Exam  General:  Well developed, well nourished, no acute distress.healthy appearing.   Head:  Normocephalic and atraumatic. Eyes:  PERRLA, no icterus. Mouth:  No deformity or lesions, dentition normal. Neck:  Supple; no masses or thyromegaly.I cannot appreciate any masses in her neck or supra-clavicular areas. Chest Wall:  Symmetrical;  no deformities or tenderness. Lungs:  Clear throughout to auscultation.decreased BS on L and decreased BS on R.   Heart:  Regular rate and rhythm; no murmurs, rubs,  or bruits. Abdomen:  Soft, nontender and nondistended. No masses, hepatosplenomegaly or hernias noted. Normal bowel sounds. Rectal:  deferred Msk:  joint tenderness,  decreased ROM, and rhematoid changes.   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 There is no acute edema or phlebitis or swollen hot red joints. 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 Neurologic:  Alert and  oriented x4;  grossly normal neurologically. Skin:  macular rash:.   Cervical Nodes:  No significant cervical adenopathy. Inguinal Nodes:  No significant inguinal adenopathy. Psych:  Alert and cooperative. Normal mood and affect.   Impression & Recommendations:  Problem # 1:  DYSPHAGIA UNSPECIFIED (ICD-787.20) Assessment Improved Probable resolving Candida esophagitis related to antibiotics, PPI, H2 blocker, and Humira drug use. We will see how she does symptomatically on continued Mycostatin oral suspension and she is to call if she does not further improve and we will consider endoscopic exam. Because of her history of new onset anemia I will repeat her CBC and anemia profile. Her acid reflux is managed well with daily Nexium 40 mg. Previous esophageal biopsies did not show evidence of Barrett's mucosa. Orders: TLB-B12, Serum-Total ONLY (16109-U04) TLB-Ferritin (82728-FER) TLB-Folic Acid (Folate) (82746-FOL) TLB-IBC Pnl (Iron/FE;Transferrin) (83550-IBC) TLB-CBC Platelet - w/Differential (85025-CBCD)  Problem # 2:  ANEMIA, IRON DEFICIENCY (ICD-280.9) Assessment: Unchanged Labs pending Orders: TLB-B12, Serum-Total ONLY (54098-J19) TLB-Ferritin (82728-FER) TLB-Folic Acid (Folate) (82746-FOL) TLB-IBC Pnl (Iron/FE;Transferrin) (83550-IBC) TLB-CBC Platelet - w/Differential (85025-CBCD)  Problem # 3:  PSORIASIS (ICD-696.1) Assessment: Improved Continue Humira injections as per dermatology.  Problem # 4:  DIABETES-TYPE 2 (ICD-250.00) Assessment: Improved Continue oral medications and followup with Dr. Petra Kuba as  planned  Patient Instructions: 1)  Please go to the basement for lab work. 2)  Continue the mycostatin liquid. 3)  The medication list was reviewed and reconciled.  All changed / newly prescribed medications were explained.  A complete medication list was provided to the patient / caregiver. 4)  Please continue current medications.  5)  Copy sent to : Dr. Corine Shelter. 6)  Please schedule a follow-up appointment as needed.

## 2010-12-30 NOTE — Letter (Signed)
Summary: EGD Instructions  [facility]  [address] [place], [place] [address]   Phone: [phone]  Fax: [phone]       [name]    1941-11-01    MRN: [id number]       Procedure Day Dorna Bloom: Wednesday, 05/05/10     Arrival Time:  1:00     Procedure Time: 2:00     Location of Procedure:                    _X  _ [facility] (4th Floor)    PREPARATION FOR ENDOSCOPY   On 05/05/10  THE DAY OF THE PROCEDURE:  1.   No solid foods, milk or milk products are allowed after midnight the night before your procedure.  2.   Do not drink anything colored red or purple.  Avoid juices with pulp.  No orange juice.  3.  You may drink clear liquids until 12:00, which is 2 hours before your procedure.                                                                                                CLEAR LIQUIDS INCLUDE: Water Jello Ice Popsicles Tea (sugar ok, no milk/cream) Powdered fruit flavored drinks Coffee (sugar ok, no milk/cream) Gatorade Juice: apple, white grape, white cranberry  Lemonade Clear bullion, consomm, broth Carbonated beverages (any kind) Strained chicken noodle soup Hard Candy   MEDICATION INSTRUCTIONS  Unless otherwise instructed, you should take regular prescription medications with a small sip of water as early as possible the morning of your procedure.                     OTHER INSTRUCTIONS  You will need a responsible adult at least 71 years of age to accompany you and drive you home.   This person must remain in the waiting room during your procedure.  Wear loose fitting clothing that is easily removed.  Leave jewelry and other valuables at home.  However, you may wish to bring a book to read or an iPod/MP3 player to listen to music as you wait for your procedure to start.  Remove all body piercing jewelry and leave at home.  Total time from sign-in until discharge is approximately 2-3 hours.  You  should go home directly after your procedure and rest.  You can resume normal activities the day after your procedure.  The day of your procedure you should not:   Drive   Make legal decisions   Operate machinery   Drink alcohol   Return to work  You will receive specific instructions about eating, activities and medications before you leave.    The above instructions have been reviewed and explained to me by   _______________________    I fully understand and can verbalize these instructions _____________________________ Date _________

## 2010-12-30 NOTE — Procedures (Signed)
Summary: Colon   Colonoscopy  Procedure date:  11/01/2006  Findings:      Location:  [facility].   Patient Name: [name], Shelby C. MRN:  Procedure Procedures: Colonoscopy CPT: [phone].  Personnel: Endoscopist: Vania Rea. Jarold Motto, MD.  Exam Location: Exam performed in Outpatient Clinic. Outpatient  Patient Consent: Procedure, Alternatives, Risks and Benefits discussed, consent obtained, from patient. Consent was obtained by the RN.  Indications Symptoms: Hematochezia.  History  Current Medications: Patient is not currently taking Coumadin.  Medical/ Surgical History: Adult Onset Diabetes, Hypertension, Psoriasis, COPD,  Pre-Exam Physical: Performed Nov 01, 2006. Cardio-pulmonary exam, Rectal exam, Abdominal exam, Extremity exam, Mental status exam WNL.  Comments: Pt. history reviewed/updated, physical exam performed prior to initiation of sedation?yes Exam Exam: Extent of exam reached: Cecum, extent intended: Cecum.  Patient position: on left side. Time to Cecum: 00:04:16. Time for Withdrawl: 00:04:26. Colon retroflexion performed. Images taken. ASA Classification: III. Tolerance: excellent.  Monitoring: Pulse and BP monitoring, Oximetry used. Supplemental O2 given. at 2 Liters.  Colon Prep Used Golytely for colon prep. Prep results: good.  Sedation Meds: Patient assessed and found to be appropriate for moderate (conscious) sedation. Fentanyl 75 mcg. given IV. Versed 5 mg. given IV.  Instrument(s): CF 140L. Serial [id number].  Findings - DIVERTICULOSIS: Descending Colon to Sigmoid Colon. Not bleeding. ICD9: Diverticulosis, Colon: 562.10.  - NORMAL EXAM: Cecum to Rectum. Not Seen: Polyps. AVM's. Colitis. Tumors. Melanosis. Crohn's.  - HEMORRHOIDS: Internal and External. Size: Medium. Not bleeding. Not thrombosed. ICD9: Hemorrhoids, Internal and  External: 455.6.   Assessment  Diagnoses: 562.10: Diverticulosis, Colon.  455.6: Hemorrhoids,  Internal and  External.   Events  Unplanned Interventions: No intervention was required.  Plans Medication Plan: Referring provider to order medications. Fiber supplements: Methylcellulose 1 tsp QAM, starting Nov 01, 2006 for indefinitely.   Patient Education: Patient given standard instructions for: Diverticulosis. Hemorrhoids. High fiber diet.  Disposition: After procedure patient sent to recovery. After recovery patient sent home.  Scheduling/Referral: EGD, to [facility]. Jarold Motto, MD, on Nov 01, 2006.    cc: Corine Shelter, MD   This report was created from the original endoscopy report, which was reviewed and signed by the above listed endoscopist.

## 2010-12-30 NOTE — Assessment & Plan Note (Signed)
Summary: problems swallowing--ch.   History of Present Illness Visit Type: Follow-up Visit Primary GI MD: Sheryn Bison MD FACP FAGA Primary Provider: Corine Shelter, MD Chief Complaint: pt is having solid and liquid dysphagia.  Pt states it is worse than when she was here in March. History of Present Illness:   70 year old African American female with ankylosing spondylitis, psoriasis, chronic sinusitis, COPD, hypertension, and overall set diabetes, chronic iron deficiency, and chronic GERD.  She recently treated for Candida esophagitis without improvement. She continued to have solid and liquid food dysphagia with some discomfort with swallowing in the retropharyngeal area. She also has worsening shortness of breath, postnasal drainage, coughing, and dyspnea with exertion. She currently is on Flonase, he often, Advair inhaler, Humira, and albuterol. She has continued paroxysm of the severe shortness of breath, but denies frequent reflux-aspiration episodes. She is on daily Nexium 40 mg the morning and ranitidine 150 mg at bedtime. Her last endoscopic exam was several years ago. She denies lower gastrointestinal or hepatobiliary problems at this time.  She recently seem to respond the Mycostatin mouthwash, but her dysphagia for solids and liquids has worsened. She apparently has been evaluated by numerous physicians for thyroid dysfunction and for chronic sinusitis. She currently sees Dr. Gregary Signs for her pulmonary problems.   GI Review of Systems    Reports acid reflux, dysphagia with liquids, and  dysphagia with solids.      Denies abdominal pain, belching, bloating, chest pain, heartburn, loss of appetite, nausea, vomiting, vomiting blood, weight loss, and  weight gain.        Denies anal fissure, black tarry stools, change in bowel habit, constipation, diarrhea, diverticulosis, fecal incontinence, heme positive stool, hemorrhoids, irritable bowel syndrome, jaundice, light  color stool, liver problems, rectal bleeding, and  rectal pain.    Current Medications (verified): 1)  Flonase 50 Mcg/act Susp (Fluticasone Propionate) .... As Needed 2)  Theophylline Cr 200 Mg Xr12h-Tab (Theophylline) .Marland Kitchen.. 1 Tab Two Times A Day 3)  Glucovance 2.5-500 Mg Tabs (Glyburide-Metformin) .Marland Kitchen.. 1 Tab Two Times A Day As Needed On Hold Until Bs Increases 4)  Lotrel 10-40 Mg Caps (Amlodipine Besy-Benazepril Hcl) .Marland Kitchen.. 1 Cap Every Evening 5)  Benicar Hct 40-12.5 Mg Tabs (Olmesartan Medoxomil-Hctz) .Marland Kitchen.. 1 Tab Every Morning 6)  Prednisone 5 Mg Tabs (Prednisone) .Marland Kitchen.. 1 Tab On M,w,fri 7)  Travatan 0.004 % Soln (Travoprost) .... Daily 8)  Premarin 0.3 Mg Tabs (Estrogens Conjugated) .Marland Kitchen.. 1 Tab Daily 9)  Nexium 40 Mg Cpdr (Esomeprazole Magnesium) .Marland Kitchen.. 1 Tab Every Morning 30 Minutes Before Breakfast 10)  Advair Diskus 250-50 Mcg/dose Aepb (Fluticasone-Salmeterol) .Marland Kitchen.. 1 Puff Two Times A Day 11)  Ranitidine Hcl 150 Mg Caps (Ranitidine Hcl) .... Take 1 Capsule By Mouth Once A Day 12)  Aspirin 81 Mg Tbec (Aspirin) .Marland Kitchen.. 1 Tab By Mouth Once Daily 13)  Humira 40 Mg/0.50ml Kit (Adalimumab) .... Take Every 2 Weeks 14)  Icaps  Caps (Multiple Vitamins-Minerals) .Marland Kitchen.. 1 By Mouth Two Times A Day 15)  Acular 0.5 % Soln (Ketorolac Tromethamine) .... Use As Directed 16)  Albuterol Sulfate (2.5 Mg/56ml) 0.083% Nebu (Albuterol Sulfate) .... Use As Needed 17)  Tandem 162-115.2 Mg Caps (Ferrous Fum-Iron Polysacch) .... Take 1 Capsule By Mouth Once A Day  Allergies (verified): No Known Drug Allergies  Past History:  Past medical, surgical, family and social histories (including risk factors) reviewed for relevance to current acute and chronic problems.  Past Medical History: Reviewed history from 08/05/2009 and no changes required. HEART  MURMUR (ICD-785.2) GERD (ICD-530.81) DIABETES-TYPE 2 (ICD-250.00)  Past Surgical History: Reviewed history from 02/17/2010 and no changes  required. hysterectomy-1976 appendectomy- 1976 Eye surgery  implant lt. eye and rt. eye occipital nodule removal Cyst in upper rt. eye  Family History: Reviewed history from 02/17/2010 and no changes required. sister has uncertain type of thyroid dz No FH of Colon Cancer: Sinus Cancer-mother  Social History: Reviewed history from 02/17/2010 and no changes required. 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 occasional smoker Alcohol Use - no Daily Caffeine Use  Review of Systems       The patient complains of cough.  The patient denies allergy/sinus, anemia, anxiety-new, arthritis/joint pain, back pain, blood in urine, breast changes/lumps, change in vision, confusion, coughing up blood, depression-new, fainting, fatigue, fever, headaches-new, hearing problems, heart murmur, heart rhythm changes, itching, menstrual pain, muscle pains/cramps, night sweats, nosebleeds, pregnancy symptoms, shortness of breath, skin rash, sleeping problems, sore throat, swelling of feet/legs, swollen lymph glands, thirst - excessive, urination - excessive, urination changes/pain, urine leakage, and voice change.   General:  Complains of malaise; denies fever, chills, sweats, anorexia, fatigue, weakness, weight loss, and sleep disorder. CV:  Complains of dyspnea on exertion; denies chest pains, angina, palpitations, syncope, orthopnea, PND, peripheral edema, and claudication. Resp:  Complains of dyspnea with exercise, cough, and sputum; denies dyspnea at rest, wheezing, coughing up blood, and pleurisy. GI:  Complains of difficulty swallowing and pain on swallowing; denies nausea, indigestion/heartburn, vomiting, vomiting blood, abdominal pain, jaundice, gas/bloating, diarrhea, constipation, change in bowel habits, bloody BM's, black BMs, and fecal incontinence. MS:  Complains of joint swelling, joint stiffness, low back pain, and muscle cramps; denies joint pain / LOM, joint deformity,  muscle weakness, muscle atrophy, leg pain at night, leg pain with exertion, and shoulder pain / LOM hand / wrist pain (CTS). Neuro:  Denies weakness, paralysis, abnormal sensation, seizures, syncope, tremors, vertigo, transient blindness, frequent falls, frequent headaches, difficulty walking, headache, sciatica, radiculopathy other:, restless legs, memory loss, and confusion. Endo:  Denies cold intolerance, heat intolerance, polydipsia, polyphagia, polyuria, unusual weight change, and hirsutism. Heme:  Denies bruising, bleeding, enlarged lymph nodes, and pagophagia. Allergy:  Complains of hay fever and recurrent infections; denies hives, rash, and sneezing.  Vital Signs:  Patient profile:   70 year old female Height:      65 inches Weight:      187 pounds BMI:     31.23 BSA:     1.92 Pulse rate:   76 / minute Pulse rhythm:   regular BP sitting:   10 / 64  (left arm) Cuff size:   regular  Vitals Entered By: Francee Piccolo CMA Duncan Dull) (Apr 27, 2010 11:17 AM)  Physical Exam  General:  Well developed, well nourished, no acute distress.healthy appearing.   Head:  Normocephalic and atraumatic. Eyes:  PERRLA, no icterus. Mouth:  No deformity or lesions, dentition normal. Neck:  Supple; no masses or thyromegaly. Lungs:  Clear throughout to auscultation.rhonchi bilateral.   Heart:  Regular rate and rhythm; no murmurs, rubs,  or bruits. Abdomen:  Soft, nontender and nondistended. No masses, hepatosplenomegaly or hernias noted. Normal bowel sounds. Extremities:  No clubbing, cyanosis, edema or deformities noted. Neurologic:  Alert and  oriented x4;  grossly normal neurologically. Cervical Nodes:  No significant cervical adenopathy. Psych:  Alert and cooperative. Normal mood and affect.   Impression & Recommendations:  Problem # 1:  DYSPHAGIA UNSPECIFIED (ICD-787.20) Assessment Deteriorated Proceed with endoscopic exam and possible dilatation.  We will continue reflex regime and reflux  medications as currently outlined. I can see no evidence of recurrent candida infection on oropharyngeal exam. She certainly is a good candidate for chronic candida infections, previous endoscopic exam documented this, and she may need Diflucan therapy. She is on several immunosuppressant medications.  Problem # 2:  ANEMIA, IRON DEFICIENCY (ICD-280.9) Assessment: Improved Continue daily tandem therapy. Recent Hemoccult cards were guaiac negative.  Problem # 3:  COPD (ICD-496) Assessment: Deteriorated Current symptoms are most consistent with asthmatic bronchitis, perhaps chronic acid reflux disease with secondary aspiration. Pulmonary consultation has been requested and we will repeat her endoscopic exam. She is to continue all of her various inhalers until further review by pulmonary has been completed.  Problem # 4:  PSORIASIS (ICD-696.1) Assessment: Improved On every q. week Humira subcutaneous injections.  Patient Instructions: 1)  Please continue current medications.  2)  Avoid foods high in acid content ( tomatoes, citrus juices, spicy foods) . Avoid eating within 3 to 4 hours of lying down or before exercising. Do not over eat; try smaller more frequent meals. Elevate head of bed four inches when sleeping.  3)  Conscious Sedation brochure given.  4)  Upper Endoscopy with Dilatation brochure given.  5)  Pulmonary consultation requested and the patient may need pulmonary function test and further evaluation for possible pulmonary hypertension.  Appended Document: problems swallowing--ch.    Clinical Lists Changes  Orders: Added new Test order of EGD (EGD) - Signed Added new Referral order of Pulmonary Referral (Pulmonary) - Signed

## 2010-12-30 NOTE — Assessment & Plan Note (Signed)
Summary: Post Proc 3 weeks/dfs    History of Present Illness Visit Type: Follow-up Visit Primary GI MD: Sheryn Bison MD FACP FAGA Primary Provider: Corine Shelter, MD Requesting Provider: na Chief Complaint: F/u from endo. Pt states that she feels much better and denies any GI complaints  History of Present Illness:   She is markedly improved and no longer has dysphagia or painful swallowing. However, her acid reflux and regurgitation have returned off PPI therapy. Endoscopy showed severe candida esophagitis associated with her chronic prednisone use, Humira use for her Psoriatic  arthritis, and steroid inhalers.She also had been on iron now for several months because of iron deficiency, takes ranitidine 150 mg at bedtime, and daily aspirin. She has marked COPD and is followed by Dr. Petra Kuba in pulmonary. She did have a consultation with Dr. Sherene Sires, but lost to cancel this because of a conflict in care with Dr.Kilpatrick.   GI Review of Systems      Denies abdominal pain, acid reflux, belching, bloating, chest pain, dysphagia with liquids, dysphagia with solids, heartburn, loss of appetite, nausea, vomiting, vomiting blood, weight loss, and  weight gain.        Denies anal fissure, black tarry stools, change in bowel habit, constipation, diarrhea, diverticulosis, fecal incontinence, heme positive stool, hemorrhoids, irritable bowel syndrome, jaundice, light color stool, liver problems, rectal bleeding, and  rectal pain.    Current Medications (verified): 1)  Flonase 50 Mcg/act Susp (Fluticasone Propionate) .... As Needed 2)  Theophylline Cr 200 Mg Xr12h-Tab (Theophylline) .Marland Kitchen.. 1 Tab Two Times A Day 3)  Glucovance 2.5-500 Mg Tabs (Glyburide-Metformin) .Marland Kitchen.. 1 Tab Two Times A Day As Needed On Hold Until Bs Increases 4)  Benicar Hct 40-12.5 Mg Tabs (Olmesartan Medoxomil-Hctz) .Marland Kitchen.. 1 Tab Every Morning 5)  Prednisone 5 Mg Tabs (Prednisone) .Marland Kitchen.. 1 Tab On M,w,fri 6)  Travatan 0.004 % Soln  (Travoprost) .... Daily 7)  Premarin 0.3 Mg Tabs (Estrogens Conjugated) .Marland Kitchen.. 1 Tab Daily 8)  Nexium 40 Mg Cpdr (Esomeprazole Magnesium) .Marland Kitchen.. 1 Tab Every Morning 30 Minutes Before Breakfast(On Hold For 3 Weeks) 9)  Advair Diskus 250-50 Mcg/dose Aepb (Fluticasone-Salmeterol) .Marland Kitchen.. 1 Puff Two Times A Day 10)  Ranitidine Hcl 150 Mg Caps (Ranitidine Hcl) .... Take 1 Capsule By Mouth At Bedtime 11)  Aspirin 81 Mg Tbec (Aspirin) .Marland Kitchen.. 1 Tab By Mouth Once Daily 12)  Humira 40 Mg/0.21ml Kit (Adalimumab) .... Take Every 2 Weeks 13)  Icaps  Caps (Multiple Vitamins-Minerals) .Marland Kitchen.. 1 By Mouth Two Times A Day 14)  Acular 0.5 % Soln (Ketorolac Tromethamine) .... Use As Directed 15)  Albuterol Sulfate (2.5 Mg/66ml) 0.083% Nebu (Albuterol Sulfate) .... Use As Needed 16)  Tandem 162-115.2 Mg Caps (Ferrous Fum-Iron Polysacch) .... Take 1 Capsule By Mouth Once A Day 17)  Diflucan 100 Mg  Tabs (Fluconazole) .... Two Times A Day For One Day, Then Daily For 3 Weeks. 18)  Amlodipine Besylate 5 Mg Tabs (Amlodipine Besylate) .... One Tablet By Mouth Once Daily  Allergies (verified): No Known Drug Allergies  Past History:  Past medical, surgical, family and social histories (including risk factors) reviewed for relevance to current acute and chronic problems.  Past Medical History: Reviewed history from 04/30/2010 and no changes required. HEART MURMUR (ICD-785.2) GERD (ICD-530.81) DIABETES-TYPE 2 (ICD-250.00) Hypertension     - Try off ACE April 30, 2010  Asthma/COPD    - PFT's rec April 30, 2010   Past Surgical History: Reviewed history from 04/30/2010 and no changes required.  hysterectomy-1976 appendectomy- 1976 Eye surgery  implant lt. eye and rt. eye occipital nodule removal Cyst in upper rt. eye 09/2009  Family History: Reviewed history from 04/30/2010 and no changes required. sister has uncertain type of thyroid dz No FH of Colon Cancer: Sinus Cancer-mother Negative for respiratory diseases or atopy     Social History: Reviewed history from 04/30/2010 and no changes required. 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  Quit in March 2011.  Smoked for 55 yrs up to 1/2 ppd. Alcohol Use - no Daily Caffeine Use  Review of Systems       The patient complains of arthritis/joint pain.  The patient denies allergy/sinus, anemia, anxiety-new, back pain, blood in urine, breast changes/lumps, change in vision, confusion, cough, coughing up blood, depression-new, fainting, fatigue, fever, headaches-new, hearing problems, heart murmur, heart rhythm changes, itching, menstrual pain, muscle pains/cramps, night sweats, nosebleeds, pregnancy symptoms, shortness of breath, skin rash, sleeping problems, sore throat, swelling of feet/legs, swollen lymph glands, thirst - excessive , urination - excessive , urination changes/pain, urine leakage, vision changes, and voice change.    Vital Signs:  Patient profile:   70 year old female Height:      65 inches Weight:      187 pounds BMI:     31.23 BSA:     1.92 Pulse rate:   76 / minute Pulse rhythm:   regular BP sitting:   138 / 80  (left arm) Cuff size:   regular  Vitals Entered By: Ok Anis CMA (May 25, 2010 9:41 AM)  Physical Exam  General:  Well developed, well nourished, no acute distress.short statured.   Head:  Normocephalic and atraumatic. Eyes:  PERRLA, no icterus.exam deferred to patient's ophthalmologist.   Mouth:  No deformity or lesions, dentition normal.No oral or pharyngeal lesions of candida noted. Neck:  Supple; no masses or thyromegaly. Cervical Nodes:  No significant cervical adenopathy. Psych:  Alert and cooperative. Normal mood and affect.   Impression & Recommendations:  Problem # 1:  GERD (ICD-530.81) Assessment Unchanged Her endoscopy showed Candida esophagitis and she is much improved after several weeks of Diflucan therapy with discontinuation of Nexium. However, her GERD symptoms have returned, and actually her  endoscopy showed a cervical" inlet patch" of gastric mucosa in her upper esophagus. Review of her multiple endoscopy shows severe chronic ulcerative esophagitis. I have restarted her Nexium 40 mg a day, and we'll see her as needed per her history of recurrent candidiasis associate with her multiple immunosuppressants. She has severe degenerative and Psoratic arthritis followed by Dr. August Saucer in orthopedics and also by dermatology who has her on Humira. She is up-to-date on her colonoscopy exams.  Problem # 2:  COPD (ICD-496) Assessment: Unchanged Continued followup with Dr. Petra Kuba as scheduled. I will cancel her appointment with Dr. Sherene Sires at her request.  Problem # 3:  ANEMIA, IRON DEFICIENCY (ICD-280.9) Assessment: Improved Repeat CBC and iron studies. Orders: TLB-B12, Serum-Total ONLY (16109-U04) TLB-Ferritin (82728-FER) TLB-Folic Acid (Folate) (82746-FOL) TLB-IBC Pnl (Iron/FE;Transferrin) (83550-IBC) TLB-CBC Platelet - w/Differential (85025-CBCD)  Problem # 4:  DYSPHAGIA UNSPECIFIED (ICD-787.20) Assessment: Improved  Problem # 5:  PSORIASIS (ICD-696.1) Assessment: Improved  Problem # 6:  DIABETES-TYPE 2 (ICD-250.00) Assessment: Improved  Patient Instructions: 1)  Please continue current medications.  2)  Please go to the basement for lab work. 3)  The medication list was reviewed and reconciled.  All changed / newly prescribed medications were explained.  A complete medication list was provided to the patient / caregiver.  4)  Copy sent to : Dr. Corine Shelter 5)  Please schedule a follow-up appointment as needed.  6)  Avoid foods high in acid content ( tomatoes, citrus juices, spicy foods) . Avoid eating within 3 to 4 hours of lying down or before exercising. Do not over eat; try smaller more frequent meals. Elevate head of bed four inches when sleeping.

## 2010-12-30 NOTE — Progress Notes (Signed)
Summary: next step?   Phone Note Call from Patient Call back at Home Phone [phone]   Caller: Patient Call For: Dr. Jarold Motto Reason for Call: Talk to Nurse Summary of Call: want to know what she needs to do next Initial call taken by: Vallarie Mare,  February 23, 2010 2:32 PM  Follow-up for Phone Call        Pt asking if she needs follow up appt.  She is planning to send in stool sample today.  We will wait and see what stool sample shows and then decide on follow up.  Pt notified. Follow-up by: Ashok Cordia RN,  February 24, 2010 9:04 AM

## 2010-12-30 NOTE — Letter (Signed)
Summary: [facility]: Immunoassay Fecal Occult Blood (iFOB) Order [facility]  [address] [place], [place] [address]   Phone: [phone]  Fax: [phone]      [facility]: Immunoassay Fecal Occult Blood (iFOB) Order Form   February 17, 2010 MRN: [id number]   [name] May 18, 1941   Physicican Name: Jarold Motto  Diagnosis Code:  280.9      Ashok Cordia RN

## 2010-12-30 NOTE — Progress Notes (Signed)
Summary: triage   Phone Note Call from Patient Call back at Home Phone [phone]   Caller: Patient Call For: Dr. Jarold Motto Reason for Call: Talk to Nurse Summary of Call: pt would like to come in sooner than next available for dysphagia and "throat irritation"... pt NP3 Initial call taken by: Vallarie Mare,  February 09, 2010 11:45 AM  Follow-up for Phone Call        Pt states has surgery a few weeks ago and has had problems swallowing since.   Req OV.  Appt sch for 02/17/10 with Dr. Jarold Motto. Follow-up by: Ashok Cordia RN,  February 09, 2010 11:56 AM

## 2011-01-11 ENCOUNTER — Other Ambulatory Visit (HOSPITAL_COMMUNITY): Payer: Self-pay | Admitting: Pulmonary Disease

## 2011-01-11 DIAGNOSIS — J019 Acute sinusitis, unspecified: Secondary | ICD-10-CM

## 2011-01-13 ENCOUNTER — Encounter (HOSPITAL_BASED_OUTPATIENT_CLINIC_OR_DEPARTMENT_OTHER)
Admission: RE | Admit: 2011-01-13 | Discharge: 2011-01-13 | Disposition: A | Discharge status: Home / Self Care | Payer: Medicare Other | Source: Ambulatory Visit | Attending: Orthopedic Surgery | Admitting: Orthopedic Surgery

## 2011-01-13 ENCOUNTER — Ambulatory Visit (HOSPITAL_COMMUNITY)
Admission: RE | Admit: 2011-01-13 | Discharge: 2011-01-13 | Disposition: A | Discharge status: Home / Self Care | Payer: MEDICARE | Source: Ambulatory Visit | Attending: Pulmonary Disease | Admitting: Pulmonary Disease

## 2011-01-13 DIAGNOSIS — J322 Chronic ethmoidal sinusitis: Secondary | ICD-10-CM | POA: Insufficient documentation

## 2011-01-13 DIAGNOSIS — Z01810 Encounter for preprocedural cardiovascular examination: Secondary | ICD-10-CM | POA: Insufficient documentation

## 2011-01-13 DIAGNOSIS — Z01812 Encounter for preprocedural laboratory examination: Secondary | ICD-10-CM | POA: Insufficient documentation

## 2011-01-13 DIAGNOSIS — J019 Acute sinusitis, unspecified: Secondary | ICD-10-CM

## 2011-01-13 DIAGNOSIS — J321 Chronic frontal sinusitis: Secondary | ICD-10-CM | POA: Insufficient documentation

## 2011-01-13 DIAGNOSIS — R51 Headache: Secondary | ICD-10-CM | POA: Insufficient documentation

## 2011-01-13 DIAGNOSIS — J320 Chronic maxillary sinusitis: Secondary | ICD-10-CM | POA: Insufficient documentation

## 2011-01-13 DIAGNOSIS — R42 Dizziness and giddiness: Secondary | ICD-10-CM | POA: Insufficient documentation

## 2011-01-13 LAB — BASIC METABOLIC PANEL
Chloride: 106 mEq/L (ref 96–112)
GFR calc non Af Amer: 58 mL/min — ABNORMAL LOW (ref >60)
Potassium: 4.2 mEq/L (ref 3.5–5.1)
Sodium: 141 mEq/L (ref 135–145)

## 2011-01-18 ENCOUNTER — Ambulatory Visit (HOSPITAL_BASED_OUTPATIENT_CLINIC_OR_DEPARTMENT_OTHER)
Admission: RE | Admit: 2011-01-18 | Discharge: 2011-01-18 | Disposition: A | Discharge status: Home / Self Care | Payer: Medicare Other | Source: Ambulatory Visit | Attending: Orthopedic Surgery | Admitting: Orthopedic Surgery

## 2011-01-18 DIAGNOSIS — J449 Chronic obstructive pulmonary disease, unspecified: Secondary | ICD-10-CM | POA: Insufficient documentation

## 2011-01-18 DIAGNOSIS — G5620 Lesion of ulnar nerve, unspecified upper limb: Secondary | ICD-10-CM | POA: Insufficient documentation

## 2011-01-18 DIAGNOSIS — J4489 Other specified chronic obstructive pulmonary disease: Secondary | ICD-10-CM | POA: Insufficient documentation

## 2011-01-18 DIAGNOSIS — I10 Essential (primary) hypertension: Secondary | ICD-10-CM | POA: Insufficient documentation

## 2011-01-18 DIAGNOSIS — G5600 Carpal tunnel syndrome, unspecified upper limb: Secondary | ICD-10-CM | POA: Insufficient documentation

## 2011-01-18 LAB — POCT HEMOGLOBIN-HEMACUE: Hemoglobin: 12.9 g/dL (ref 12.0–15.0)

## 2011-01-18 LAB — GLUCOSE, CAPILLARY: Glucose-Capillary: 163 mg/dL — ABNORMAL HIGH (ref 70–99)

## 2011-02-14 LAB — GLUCOSE, CAPILLARY
Glucose-Capillary: 120 mg/dL — ABNORMAL HIGH (ref 70–99)
Glucose-Capillary: 138 mg/dL — ABNORMAL HIGH (ref 70–99)

## 2011-03-02 LAB — ANAEROBIC CULTURE

## 2011-03-02 LAB — CULTURE, ROUTINE-SINUS: Culture: NO GROWTH

## 2011-03-02 LAB — GLUCOSE, CAPILLARY: Glucose-Capillary: 122 mg/dL — ABNORMAL HIGH (ref 70–99)

## 2011-03-03 LAB — BASIC METABOLIC PANEL
CO2: 25 mEq/L (ref 19–32)
Chloride: 109 mEq/L (ref 96–112)
GFR calc Af Amer: >60 mL/min (ref >60)
Sodium: 141 mEq/L (ref 135–145)

## 2011-03-31 ENCOUNTER — Other Ambulatory Visit: Payer: Self-pay | Admitting: Radiology

## 2011-04-12 NOTE — Op Note (Signed)
NAMEDEVANEY, SEGERS             ACCOUNT NO.:  [id number]   MEDICAL RECORD NO.:  [id number]           PATIENT TYPE:   LOCATION:                                 FACILITY:   PHYSICIAN:  Salley Scarlet., M.D.DATE OF BIRTH:  16-May-1941   DATE OF PROCEDURE:  04/30/2008  DATE OF DISCHARGE:                               OPERATIVE REPORT   PREOPERATIVE DIAGNOSIS:  Immature cataract, left eye.   POSTOPERATIVE DIAGNOSIS:  Immature cataract, left eye.   OPERATION:  Kelman phacoemulsification cataract, left eye with  intraocular lens implantation.   ANESTHESIA:  General.   JUSTIFICATION FOR PROCEDURE:  This is a 70 year old lady who has  undergone a cataract extraction from the right eye several months ago  with good visual result.  She complains of difficulty seeing to read.  She has a cataract of the left eye with a visual acuity best corrected  to 20/60.  Cataract extraction with intraocular lens implantation was  recommended.  She was admitted at this time for that purpose.  This lady  has severe COPD.  She was previously scheduled for surgery last week.  The proceducre was canceled because of breathing difficulty as the  anesthesiologist did not feel comfortable with the procedure continuing.  Today, she has had 2 breathing treatments.  The anesthesiologist now  feels because she still has a persistent cough it would be better to  proceed under general anesthesia.  Therefore, she is being operated  under general anesthesia.   PROCEDURE:  Under influence of general inhalation anesthesia, the  patient was prepped and draped in the usual manner.  The lid speculum  was inserted under the upper and lower lid of the left eye and a 4-0  silk traction suture was passed through the belly of the superior rectus  muscle for retraction.  A fornix-based conjunctival flap was turned, and  hemostasis was achieved using cautery.  A groove incision made in the  sclera at the limbus.  This  incision was dissected down to clear cornea  using a crescent blade.  A sideport incision was made at the 1:30 o  clock position.  OcuCoat was injected into the eye through the sideport  incision.  The anterior chamber was then entered through the  corneoscleral tunnel incision at 11:30 o'clock position.  An anterior  capsulotomy was performed using a bent 25-gauge needle.  The nucleus was  hydrodissected using Xylocaine.  The KPE handpiece was passed into the  eye and the nucleus was emulsified without difficulty.  The residual  cortical material was aspirated.  The posterior capsule was polished  using olive tip polisher.  The wound was widened slightly to accommodate  a foldable silicone lens.  The lens was seated into the eye behind the  iris without difficulty.  The anterior chamber was reformed, and the  pupil was constricted using Miochol.  The lips of the wound were  hydrated and tested to make sure that there was no leak.  After  ascertaining that there was no leak, the conjunctiva was closed over the  wound using thermal cautery.  1  mL of Celestone and 1 mL of gentamicin  were injected subconjunctivally.  Maxitrol ophthalmic ointment and  Pilocar ointment were applied along with a patch and Fox shield.  The  patient tolerated the procedure well and was returned to the  postanesthesia recovery room in satisfactory condition.  The  anesthesiologist feel that she should  be overnight for overnight observation.  She therefore will be admitted  for observation and discharged tomorrow if condition is satisfactory.   DISCHARGE DIAGNOSIS:  Immature cataract, left eye.      Salley Scarlet., M.D.  Electronically Signed     TB/MEDQ  D:  05/09/2008  T:  05/10/2008  Job:  [id number]

## 2011-04-12 NOTE — Op Note (Signed)
[name], [name]             ACCOUNT NO.:  [id number]   MEDICAL RECORD NO.:  [id number]          PATIENT TYPE:  AMB   LOCATION:  SDS                          FACILITY:  [facility]   PHYSICIAN:  Salley Scarlet., M.D.DATE OF BIRTH:  05-26-1941   DATE OF PROCEDURE:  11/16/2007  DATE OF DISCHARGE:                               OPERATIVE REPORT   PREOPERATIVE DIAGNOSIS:  Immature cataract, right eye.   POSTOP DIAGNOSIS:  Immature cataract, right eye.   OPERATION:  Care one phacoemulsification cataract, right eye   ANESTHESIA:  General.   JUSTIFICATION FOR PROCEDURE:  This is a 70 year old diabetic lady who  has complained, for some time, of blurring of vision with difficulty  seeing to read.  She was evaluated and found to have a visual acuity  best corrected to 20/30 on the right and 20/60 on the left.  There were  bilateral immature cataracts, worse on the left than the right.  Cataract extraction with intraocular lens implantation was recommended.  She is admitted, at this time, for that purpose.   The patient had a history of chronic obstructive pulmonary disease.  In  the anteroom before doing surgery she was coughing a bit and was found  to be awfully congested.  A breathing treatment was given with  albuterol.  The patient felt that she was good enough to undergo the  procedure.   Therefore, the patient was taken to the operating room where she was  prepped and draped in the usual manner.  However, she developed another  coughing spasm.  We explained to her that we either needed to not do the  procedure and bring her back at another time, or do it under general  anesthesia.  She felt that she wanted to go ahead and get it over with,  and requested that we do the procedure under general anesthesia.  Therefore, with the assistance of the anesthesiologist, she was  intubated, and the procedure was continued.   She was then prepped and draped in the usual manner.  The  lid speculum  was inserted under the upper and lower lid of the right eye.  A 4-0 silk  traction suture was passed through the belly of the superior rectus  muscle for traction.  A fornix-based conjunctival flap was turned, and  hemostasis achieved using cautery.  An incision made in the sclera at  the limbus.  This incision was dissected down to clear cornea using a  crescent blade.  A sideport incision was made at the level at the 1 30  o'clock position.  The anterior chamber was then entered at the 11 and 3  o'clock position.  The pupil was found to the relatively miotic;  therefore, a mixture of adrenaline and saline was injected into the eye  so as to get maximal pupillary constriction.  An anterior capsulotomy  was then using a bent 25-gauge needle.  The nucleus was hydrodissected  using Xylocaine.  The KP handpiece was passed into the eye, and the  nucleus was emulsified without difficulty.   The residual cortical fluid was aspirated.  The  posterior capsule was  polished using an olive-tip polisher.  The wound was widened slightly to  accommodate a foldable silicone lens.  The lens was seated into the eye  behind the iris without difficulty.  The anterior chamber was reformed,  and the pupil was constricted using Miochol.  The residual Occucoat was  aspirated from the eye.  The lips of the wound were hydrated and tested  to make sure that there was no leak.   After ascertaining there was no leak, the conjunctiva was closed over  the wound using thermal cautery.  Then 1 mL of Celestone and 0.5 mL of  gentamicin were injected subconjunctivally.  Maxitrol ophthalmic  ointment, and Pilopine ointment were applied along with a patch and Fox  shield.  The patient tolerated the procedure well and was discharged to  the post anesthesia recovery room in satisfactory condition.  She was  instructed to rest today, to take Tylenol q.4 h. as needed for pain, and  to see me in the office,  tomorrow, for further evaluation.   DISCHARGE DIAGNOSIS:  Immature cataract, right eye.      Salley Scarlet., M.D.  Electronically Signed     TB/MEDQ  D:  11/16/2007  T:  11/18/2007  Job:  [id number]

## 2011-04-12 NOTE — Op Note (Signed)
[name], [name]             ACCOUNT NO.:  [id number]   MEDICAL RECORD NO.:  [id number]          PATIENT TYPE:  AMB   LOCATION:  SDS                          FACILITY:  [facility]   PHYSICIAN:  Wilmon Arms. Corliss Skains, M.D. DATE OF BIRTH:  [birth year]   DATE OF PROCEDURE:  01/25/2008  DATE OF DISCHARGE:  01/25/2008                               OPERATIVE REPORT   PREOPERATIVE DIAGNOSIS:  Lipoma left posterior neck at the occiput.   POSTOPERATIVE DIAGNOSIS:  Lipoma left posterior neck at the occiput.   PROCEDURE:  Excision of lipoma, posterior neck at the occiput (4 cm).   SURGEON:  Wilmon Arms. Tsuei, M.D.   ANESTHESIA:  Local MAC.   INDICATIONS:  The patient is a 70 year old female with diabetes as well  as rheumatoid arthritis who presents with a mass growing on the back of  her head at her neck for many years.  She presents for elective  excision.   DESCRIPTION OF PROCEDURE:  The patient was brought to the operating room  and placed in the lateral position on the operating table on her right  side.  We shaved a little bit of hair over the mass.  This area was then  prepped with Betadine and draped in a sterile fashion.  A time-out was  taken to assure the proper patient and proper procedure.  The area was  anesthetized with 0.25% Marcaine with epinephrine.  We made a transverse  incision across the mass.  Unfortunately, the patient was unable to hold  still despite intravenous sedation and she began moving her head.  This  caused a disruption in our sterile field.  We were able to identify the  entire lipoma and remove it.  Hemostasis was obtained with cautery.  The  wound was then thoroughly irrigated with saline.  Staples were used to  close the skin loosely.  A dry dressing was applied.  The patient was  then awakened and brought to the recovery room in stable condition.      Wilmon Arms. Tsuei, M.D.  Electronically Signed     MKT/MEDQ  D:  01/25/2008  T:  01/25/2008  Job:   [id number]

## 2011-04-15 NOTE — Assessment & Plan Note (Signed)
[facility]                         GASTROENTEROLOGY OFFICE NOTE   NAME:BARNESCharrise, [name]                    MRN:          [id number]  DATE:12/05/2006                            DOB:          1941-04-25    [name] no longer has dysphagia after a dilation endoscopy November 01, 2006.  She also had colonoscopy which was unremarkable except for  some diverticulosis and hemorrhoids.   She continues to complain of swelling and pain in her left neck and left  shoulder area.  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   Examination of her neck area today was unremarkable.   I have asked [name] to continue with chronic Nexium therapy for her  recurrent esophageal strictures.  The biopsies of her esophagus on  November 01, 2006 showed focal parakeratosis.  There was no evidence of  Barrett's mucosa.   We will see her on a p.r.n. basis as needed and we will send this note  to Dr. Petra Kuba to see if further evaluation of her neck pain is in  order.     Vania Rea. Jarold Motto, MD, Caleen Essex, FAGA  Electronically Signed    DRP/MedQ  DD: 12/05/2006  DT: 12/05/2006  Job #: [id number]   cc:   Mina Marble, M.D.

## 2011-04-15 NOTE — Discharge Summary (Signed)
[name], [name]             ACCOUNT NO.:  [id number]   MEDICAL RECORD NO.:  [id number]          PATIENT TYPE:  INP   LOCATION:  5124                         FACILITY:  [facility]   PHYSICIAN:  Jarome Matin, M.D.DATE OF BIRTH:  1941/08/05   DATE OF ADMISSION:  08/11/2006  DATE OF DISCHARGE:  08/17/2006                               DISCHARGE SUMMARY   ADMISSION DIAGNOSES:  1. Chronic obstructive pulmonary disease with acute bronchitis overlay      and possible pneumonia.  2. Diabetes, type 2.  3. Hypertension.   BRIEF HISTORY AND PHYSICAL AND HOSPITAL COURSE:  This was at first a 34-  hour admission of this 70 year old African-American female who is a  patient of Dr. Petra Kuba.  She has COPD with acute bronchitis overlay.  She is also a type 2 diabetic.  She also has psoriasis and glaucoma.  She has had persistent shortness of breath and persistent cough 2 days  prior coming to the hospital.   On physical examination, vital signs showed a temperature of 97.8,  pulse of 79, respirations 20, blood pressure 126/69. HEENT: Negative.  Neck veins were flat.  Chest was clear to auscultation and percussion.  Regular sinus rhythm. Abdomen was soft. No mass, no organomegaly.   She had bronchitis and possibly early pneumonia overlying COPD.  The  patient felt a little better the next day.  Chest x-ray looked somewhat  improved, and we started her on some  nebulizer treatments.  Her O2 saturation went on down to 82%. Increased  her nebulizer treatments and albuterol and guanfacine 600 mg q. 6 h.  Urine cultures essentially negative.  Blood gases gradually improved. We  kept her over 24 hours because she continued to have poor blood gases.  Increased O2 to 3 liters so we could get her up to 90% saturation.  We  have to keep O2 saturation above 90% off O2, so we decided we would have  to discharge her on 2 liters, also continue on:  1. Advair inhaled 250/50 one puff twice a day.  2.  Avapro 150 mg.  3. Hydrochlorothiazide 12.5 mg daily.  4. Glucophage 500 mg daily.  5. Glyburide 2.5 mg.  6. Norvasc 5 mg, Lotensin 20 mg a day.  7. We have on Protonix 40 mg daily, Pepcid 20 mg at night.  8. Timolol ophthalmic drops 1 drop each eye at bedtime.  9. Prednisone 10 mg daily  10.NovoLog insulin sliding scale for CBC 1 to 200, 3 units; 201 to      250, 5 units; 251 to 300, 7 units; over 350, 11 units; over 400,      STAT labs and call the doctor.  11.Theophylline 300 mg b.i.d.  12.__________ 2 puffs q. 4-6 h p.r.n. for wheezes.  Guanfacine 600 mg q. 6 hour for cough.  1. Keep on Avelox a bit after discharge 400 mg p.o. daily.   She is to go to the office to see Dr. Petra Kuba about 2 weeks after  discharge.           ______________________________  Jarome Matin, M.D.  CEF/MEDQ  D:  11/15/2006  T:  11/15/2006  Job:  [id number]

## 2011-04-15 NOTE — Assessment & Plan Note (Signed)
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   I have known Shelby [name] for many years, and actually did an endoscopy and  flexible sigmoidoscopy on her back in February of 2000.  At that time she  had a smooth stricture in the distal esophagus, and was dilated to a #18 mm  size with a Savary dilator.  She also had evidence of previous peptic ulcer  disease with scarred pylorus that was dilated with the endoscope.  At that  time she gave a history of acid reflux and asthmatic bronchitis, essentially  hypertension, and previous hysterectomy and bilateral oophorectomy, with  multiple dermatologic problems including psoriasis and leukocytoclastic  vasculitis.  She was treated appropriately with acid suppressors and really  did well, was finally diagnosed as having psoriasis.  Flexible sigmoidoscopy  was performed in April of 2000.  She has never had a full colonoscopy.   The patient apparently has recently been hospitalized on September 14 with  aspiration pneumonia, and was treated by Dr. Petra Kuba.  She has a rather  severe acid reflux and currently is on omeprazole 20 mg a day.  She had had  some intermittent progressive solid food dysphagia.  She also describes  abdominal gas, bloating, and has some hemorrhoids.  She denies true  melena, hematochezia, or any hepatobiliary problems, food intolerances,  anorexia or weight loss.  She has not had recent endoscopic or barium  studies of her bowels.  She did have, in 2000, barium esophagogram that did  show a hiatal hernia and distal esophageal stricture.  I do not  have any  recent laboratory data.   MEDICATIONS:  1. Avalide 150/12.5 mg a day.  2. Theophylline CR 200 mg twice a day.  3. Lotrel 5/20 mg twice a day.  4. Ranitidine 150 mg at bedtime.  5. Omeprazole 20 mg q.a.m.  6. Glucovance 2.5/500 mg twice a day.  7. Enbrel 50 mg two times a week as an injection.  8. Advair inhalers.  9. Albuterol p.r.n.  10.Prednisone 5 mg three times a week.  11.She apparently is on intermittent Premarin therapy.   PAST MEDICAL HISTORY:  The patient also has adult-onset diabetes mellitus,  recurrent kidney stones.  She has had hypertension for 15 years along with  degenerative arthritis and psoriasis.  She has had a previous appendectomy.   FAMILY HISTORY:  Noncontributory.   SOCIAL HISTORY:  The patient is widowed and lives by herself.  She has a  degree in health care, and has previously worked as a Engineer, civil (consulting).  She used to  smoke socially, but quit recently.  She denies a history of alcohol abuse.   REVIEW OF SYSTEMS:  Is pretty much positive for many complaints, including  diffuse arthritis, swelling of her lower extremities, low grade fevers,  night sweats, back pain, pruritus, anxiety, chronic fatigue, shortness of  breath with exertion and myalgias.  She denies any specific cardiopulmonary  complaints at this time.  Review of systems otherwise is noncontributory.   PHYSICAL EXAMINATION:  She is an elderly-appearing black female appearing  slightly older than her stated age.  She weighs 203 pounds.  Her blood pressure is 92/52 and pulse was 80 and  regular.  I could not appreciate stigmata of chronic liver disease.  CHEST:  Clear without wheezes or rhonchi at this time.  I could not  appreciate murmurs, gallops or rubs.  She appeared to be in a regular  rhythm.  ABDOMEN:  She had no hepatosplenomegaly, masses or definite tenderness.  Bowel sounds were normal.  EXTREMITIES:  Were unremarkable.  MENTAL STATUS:  Clear.  RECTAL:  Exam was  deferred.   ASSESSMENT:  1. 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  2. Rule out Barrett's mucosa of her esophagus.  3. Need for screening colonoscopy because of her age, and this has never      been completed.  4. Adult-onset diabetes mellitus without insulin dependency.  5. Hypertensive cardiovascular disease.  6. History of psoriasis with associated arthropathy.  7. History of intermittent bleeding hemorrhoids.  8. History of recent pneumonia, which may have been related to aspiration.      She does also carry a diagnosis of chronic obstructive lung disease.   PLAN:  1. Outpatient endoscopy, possible dilatation.  2. Outpatient colonoscopy exam.  3. Reflux regimen along with daily Nexium and step 3 dysphagia diet until      dilation can be completed.  4. Continue other medications as per Dr. Petra Kuba.  5. Do further laboratory screening, depending on her clinical workup and      results.  I also will      ask Dr. Petra Kuba to send [place] a copy of her recent pertinent lab work      and hospitalization summary.     Shelby Rea. Jarold Motto, MD, Caleen Essex, FAGA  Electronically Signed    DRP/MedQ  DD: 10/03/2006  DT: 10/04/2006  Job #: [id number]   cc:   Shelby [name], M.D.

## 2011-04-15 NOTE — Discharge Summary (Signed)
NAMETABIA, LANDOWSKI             ACCOUNT NO.:  [id number]   MEDICAL RECORD NO.:  [id number]          PATIENT TYPE:  OIB   LOCATION:  5121                         FACILITY:  [facility]   PHYSICIAN:  Nadyne Coombes, M.D.DATE OF BIRTH:  20-Nov-1941   DATE OF ADMISSION:  05/07/2008  DATE OF DISCHARGE:  05/08/2008                               DISCHARGE SUMMARY   This is a 70 year old diabetic lady who has chronic obstructive  pulmonary disease.  She was admitted for a cataract surgery on  outpatient basis.  Because of severe COPD and a persistent cough, the  anesthesiologist also felt that it would be better to do the procedure  under general anesthesia.  The case proceeded under general without  difficulty.  However because of her pulmonary problem, the  anesthesiologist felt that she should be admitted for overnight  observation.  Therefore, she was admitted for overnight observation.  Today, the patient is comfortable.  Her breathing is not labored.  The  eye status is good with a clear cornea. The anterior chamber is deep and  the intraocular lenses are in place.  She is therefore going to be  discharged with instructions to use her drops 4 times a day.  She is to  come back to office for further instructions when she leaves the  hospital.   DISCHARGE DIAGNOSES:  1. Immature cataract, left eye.  2. Diabetes mellitus.  3. Chronic obstructive pulmonary disease.      Salley Scarlet., M.D.  Electronically Signed     ______________________________  Nadyne Coombes, M.D.    TB/MEDQ  D:  05/09/2008  T:  05/10/2008  Job:  [id number]

## 2011-06-17 NOTE — Op Note (Signed)
[name], [name]             ACCOUNT NO.:  [id number]  MEDICAL RECORD NO.:  [id number]           PATIENT TYPE:  LOCATION:                                 FACILITY:  PHYSICIAN:  Cindee Salt, M.D.       DATE OF BIRTH:  03/29/1941  DATE OF PROCEDURE:  01/18/2011 DATE OF DISCHARGE:                              OPERATIVE REPORT   PREOPERATIVE DIAGNOSES: 1. Carpal tunnel syndrome, left hand. 2. Cubital tunnel syndrome, left elbow.  POSTOPERATIVE DIAGNOSES: 1. Carpal tunnel syndrome, left hand. 2. Cubital tunnel syndrome, left elbow.  OPERATION:  Decompression of left median nerve at the wrist and decompression of left ulnar nerve at the elbow.  SURGEON:  Cindee Salt, MD  ASSISTANT:  Betha Loa, MD  ANESTHESIA:  Axillary block.  ANESTHESIOLOGIST:  Quita Skye. Krista Blue, MD  HISTORY:  The patient is a 70 year old female with a history of carpal tunnel syndrome of cubital tunnel, EMG nerve conductions positive which has not responded to conservative treatment.  She has elected to undergo surgical decompression.  Pre, peri, and postoperative course have been discussed along with risks and complications.  She is aware that there is no guarantee with the surgery, possibility of infection, recurrence of injury to arteries, nerves, and tendons, incomplete relief of symptoms, and dystrophy.  In the preoperative area, the patient is seen, the extremity is marked by both the patient and surgeon, and antibiotic is given.  PROCEDURE:  The patient was brought to the operating room where an axillary block was carried out without difficulty.  She was prepped using ChloraPrep, supine position, left arm free.  A 3-minute dry time was allowed.  A time-out was taken confirming the patient and procedure. The limb was exsanguinated with an Esmarch bandage.  The tourniquet placed high on the arm was inflated to 250 mmHg.  A longitudinal incision was made in the palm and carried down through the  subcutaneous tissue.  Bleeders were electrocauterized.  Palmar fascia was split. Superficial palmar arch was identified.  Flexor tendon of the ring little finger was identified to the ulnar side of the median nerve.  The carpal retinaculum was incised with sharp dissection.  A right-angle and Sewall retractors were placed between skin and forearm fascia.  The fascia was released for approximately a centimeter and half proximal to the wrist crease under direct vision.  The canal was explored.  Air compression of the nerve was apparent.  No further lesions were identified.  The wound was irrigated.  The skin was closed with interrupted 5-0 Vicryl Rapide sutures.  A separate incision was then made over the medial aspect of the elbow approximately 2.5 cm in length and carried down through the subcutaneous tissue.  Bleeders were again electrocauterized with bipolar.  The medial epicondyle was identified. Osborne fascia was released to the posterior aspect.  The ulnar nerve was identified.  The fascia was then dissected free from the flexor carpi ulnaris for approximately 6 cm distally.  Two new retractors were placed.  A fasciotomy was then performed of the flexor carpi ulnaris. The flexor carpi ulnaris muscle bellies and 2 heads were  then split longitudinally with a right-angle retractor.  A KMI wrist carpal tunnel skid was then placed between the ulnar nerve and the deep fascia.  This was released with an angled ENT scissors over its entire length.  This allowed complete visualization of the nerve.  No further bands were noted.  The proximal fascia was then dissected free from the posterior fascia of the upper arm.  The two new retractors were placed.  The fascia was then released with protection of the median nerve with the [facility] skid.  The fascia directly over the nerve was then released after placing the [facility] skid between the nerve and fascia pressing the nerve away and using the angled  scissors.  The deep fascia was also released. No further lesions were identified and the nerve was in continuity over its entire aspect.  The area was then irrigated with saline and the subcutaneous tissue was closed with interrupted 4-0 Vicryl after flexing the elbow to be certain that it did not dislocate.  It remained stable behind the medial epicondyle.  The skin was closed with interrupted 4-0 Vicryl Rapide sutures.  Sterile compressive dressing and long-arm splint with the elbow flexed approximately 30 degrees was applied.  On deflation of the tourniquet, all fingers immediately pinked.  She was taken to the recovery room for observation in satisfactory condition. She will be discharged home to return the [facility] [place] in 1 week on Vicodin.          ______________________________ Cindee Salt, M.D.     GK/MEDQ  D:  01/18/2011  T:  01/19/2011  Job:  [id number]  cc:   Gregary Signs A. Everardo All, MD  Electronically Signed by Cindee Salt M.D. on 06/17/2011 08:58:33 AM

## 2011-08-19 LAB — DIFFERENTIAL
Basophils Absolute: 0.1
Basophils Relative: 1
Eosinophils Relative: 3
Monocytes Absolute: 1.1 — ABNORMAL HIGH

## 2011-08-19 LAB — CBC
HCT: 32.3 — ABNORMAL LOW
Hemoglobin: 10 — ABNORMAL LOW
MCHC: 31
MCV: 76.7 — ABNORMAL LOW
RDW: 21 — ABNORMAL HIGH

## 2011-08-19 LAB — BASIC METABOLIC PANEL
CO2: 24
Glucose, Bld: 157 — ABNORMAL HIGH
Potassium: 4.2
Sodium: 137

## 2011-08-25 LAB — BASIC METABOLIC PANEL
BUN: 10
BUN: 12
Calcium: 9.8
Chloride: 104
Creatinine, Ser: 0.71
Creatinine, Ser: 0.74
GFR calc non Af Amer: >60
GFR calc non Af Amer: >60
Potassium: 3.7

## 2011-08-25 LAB — URINALYSIS, ROUTINE W REFLEX MICROSCOPIC
Ketones, ur: NEGATIVE
Nitrite: NEGATIVE
Urobilinogen, UA: 0.2
pH: 5

## 2011-08-25 LAB — CBC
MCV: 89.1
Platelets: 212
Platelets: 254
WBC: 11.9 — ABNORMAL HIGH
WBC: 13.9 — ABNORMAL HIGH

## 2011-09-02 LAB — BASIC METABOLIC PANEL
Chloride: 108
GFR calc Af Amer: >60
GFR calc non Af Amer: >60
Potassium: 4.7
Sodium: 137

## 2011-09-02 LAB — URINALYSIS, ROUTINE W REFLEX MICROSCOPIC
Bilirubin Urine: NEGATIVE
Glucose, UA: NEGATIVE
Ketones, ur: NEGATIVE
Specific Gravity, Urine: 1.02
pH: 5.5

## 2011-09-02 LAB — CBC
HCT: 32.1 — ABNORMAL LOW
MCV: 77.6 — ABNORMAL LOW
RBC: 4.14
WBC: 6.4

## 2011-09-08 ENCOUNTER — Ambulatory Visit (HOSPITAL_COMMUNITY)
Admission: RE | Admit: 2011-09-08 | Discharge: 2011-09-08 | Disposition: A | Discharge status: Home / Self Care | Payer: Medicare Other | Source: Ambulatory Visit | Attending: Pulmonary Disease | Admitting: Pulmonary Disease

## 2011-09-08 ENCOUNTER — Other Ambulatory Visit (HOSPITAL_COMMUNITY): Payer: Self-pay | Admitting: Pulmonary Disease

## 2011-09-08 DIAGNOSIS — M5030 Other cervical disc degeneration, unspecified cervical region: Secondary | ICD-10-CM | POA: Insufficient documentation

## 2011-09-08 DIAGNOSIS — M542 Cervicalgia: Secondary | ICD-10-CM

## 2011-10-06 ENCOUNTER — Inpatient Hospital Stay (HOSPITAL_COMMUNITY)
Admission: EM | Admit: 2011-10-06 | Discharge: 2011-10-08 | DRG: 192 | Disposition: A | Discharge status: Home / Self Care | Payer: Medicare Other | Source: Ambulatory Visit | Attending: Internal Medicine | Admitting: Internal Medicine

## 2011-10-06 ENCOUNTER — Emergency Department (HOSPITAL_COMMUNITY): Payer: Medicare Other

## 2011-10-06 ENCOUNTER — Other Ambulatory Visit: Payer: Self-pay

## 2011-10-06 ENCOUNTER — Encounter: Payer: Self-pay | Admitting: Emergency Medicine

## 2011-10-06 DIAGNOSIS — J329 Chronic sinusitis, unspecified: Secondary | ICD-10-CM | POA: Insufficient documentation

## 2011-10-06 DIAGNOSIS — E119 Type 2 diabetes mellitus without complications: Secondary | ICD-10-CM | POA: Diagnosis present

## 2011-10-06 DIAGNOSIS — K219 Gastro-esophageal reflux disease without esophagitis: Secondary | ICD-10-CM | POA: Diagnosis present

## 2011-10-06 DIAGNOSIS — F17200 Nicotine dependence, unspecified, uncomplicated: Secondary | ICD-10-CM | POA: Diagnosis present

## 2011-10-06 DIAGNOSIS — K208 Other esophagitis without bleeding: Secondary | ICD-10-CM | POA: Diagnosis present

## 2011-10-06 DIAGNOSIS — L4050 Arthropathic psoriasis, unspecified: Secondary | ICD-10-CM | POA: Insufficient documentation

## 2011-10-06 DIAGNOSIS — R0789 Other chest pain: Secondary | ICD-10-CM | POA: Diagnosis present

## 2011-10-06 DIAGNOSIS — IMO0002 Reserved for concepts with insufficient information to code with codable children: Secondary | ICD-10-CM

## 2011-10-06 DIAGNOSIS — K449 Diaphragmatic hernia without obstruction or gangrene: Secondary | ICD-10-CM | POA: Diagnosis present

## 2011-10-06 DIAGNOSIS — L408 Other psoriasis: Secondary | ICD-10-CM | POA: Diagnosis present

## 2011-10-06 DIAGNOSIS — I10 Essential (primary) hypertension: Secondary | ICD-10-CM | POA: Diagnosis present

## 2011-10-06 DIAGNOSIS — J449 Chronic obstructive pulmonary disease, unspecified: Secondary | ICD-10-CM

## 2011-10-06 DIAGNOSIS — D509 Iron deficiency anemia, unspecified: Secondary | ICD-10-CM | POA: Diagnosis present

## 2011-10-06 DIAGNOSIS — J441 Chronic obstructive pulmonary disease with (acute) exacerbation: Principal | ICD-10-CM | POA: Diagnosis present

## 2011-10-06 DIAGNOSIS — L409 Psoriasis, unspecified: Secondary | ICD-10-CM | POA: Insufficient documentation

## 2011-10-06 DIAGNOSIS — R002 Palpitations: Secondary | ICD-10-CM | POA: Diagnosis present

## 2011-10-06 DIAGNOSIS — Z79899 Other long term (current) drug therapy: Secondary | ICD-10-CM

## 2011-10-06 DIAGNOSIS — R1310 Dysphagia, unspecified: Secondary | ICD-10-CM | POA: Diagnosis present

## 2011-10-06 DIAGNOSIS — E039 Hypothyroidism, unspecified: Secondary | ICD-10-CM | POA: Diagnosis present

## 2011-10-06 HISTORY — DX: Pneumonia, unspecified organism: J18.9

## 2011-10-06 HISTORY — DX: Arthropathic psoriasis, unspecified: L40.50

## 2011-10-06 HISTORY — DX: Chronic obstructive pulmonary disease, unspecified: J44.9

## 2011-10-06 HISTORY — DX: Psoriasis, unspecified: L40.9

## 2011-10-06 HISTORY — DX: Ankylosing spondylitis of unspecified sites in spine: M45.9

## 2011-10-06 HISTORY — DX: Chronic sinusitis, unspecified: J32.9

## 2011-10-06 HISTORY — DX: Reserved for inherently not codable concepts without codable children: IMO0001

## 2011-10-06 HISTORY — DX: Other chronic pain: G89.29

## 2011-10-06 HISTORY — DX: Diaphragmatic hernia without obstruction or gangrene: K44.9

## 2011-10-06 HISTORY — DX: Cardiac murmur, unspecified: R01.1

## 2011-10-06 HISTORY — DX: Dorsalgia, unspecified: M54.9

## 2011-10-06 HISTORY — DX: Other specified disease of esophagus: K22.89

## 2011-10-06 HISTORY — DX: Gastro-esophageal reflux disease without esophagitis: K21.9

## 2011-10-06 HISTORY — DX: Other specified diseases of esophagus: K22.8

## 2011-10-06 HISTORY — DX: Essential (primary) hypertension: I10

## 2011-10-06 HISTORY — DX: Cardiac arrhythmia, unspecified: I49.9

## 2011-10-06 HISTORY — DX: Encounter for other specified aftercare: Z51.89

## 2011-10-06 HISTORY — DX: Anxiety disorder, unspecified: F41.9

## 2011-10-06 HISTORY — DX: Unspecified osteoarthritis, unspecified site: M19.90

## 2011-10-06 LAB — URINALYSIS, ROUTINE W REFLEX MICROSCOPIC
Bilirubin Urine: NEGATIVE
Glucose, UA: NEGATIVE mg/dL
Hgb urine dipstick: NEGATIVE
Ketones, ur: NEGATIVE mg/dL
Leukocytes, UA: NEGATIVE
Nitrite: NEGATIVE
Protein, ur: NEGATIVE mg/dL
Specific Gravity, Urine: 1.007 (ref 1.005–1.030)
Urobilinogen, UA: 0.2 mg/dL (ref 0.0–1.0)
pH: 7 (ref 5.0–8.0)

## 2011-10-06 LAB — CBC
HCT: 32.2 % — ABNORMAL LOW (ref 36.0–46.0)
Hemoglobin: 9.8 g/dL — ABNORMAL LOW (ref 12.0–15.0)
MCH: 25.9 pg — ABNORMAL LOW (ref 26.0–34.0)
MCHC: 30.4 g/dL (ref 30.0–36.0)
MCV: 85 fL (ref 78.0–100.0)
Platelets: 255 10*3/uL (ref 150–400)
RBC: 3.79 MIL/uL — ABNORMAL LOW (ref 3.87–5.11)
RDW: 18 % — ABNORMAL HIGH (ref 11.5–15.5)
WBC: 11.1 10*3/uL — ABNORMAL HIGH (ref 4.0–10.5)

## 2011-10-06 LAB — DIFFERENTIAL
Basophils Absolute: 0 10*3/uL (ref 0.0–0.1)
Basophils Relative: 0 % (ref 0–1)
Eosinophils Absolute: 0.5 10*3/uL (ref 0.0–0.7)
Eosinophils Relative: 4 % (ref 0–5)
Lymphocytes Relative: 18 % (ref 12–46)
Lymphs Abs: 2 10*3/uL (ref 0.7–4.0)
Monocytes Absolute: 1 10*3/uL (ref 0.1–1.0)
Monocytes Relative: 9 % (ref 3–12)
Neutro Abs: 7.6 10*3/uL (ref 1.7–7.7)
Neutrophils Relative %: 69 % (ref 43–77)

## 2011-10-06 LAB — BASIC METABOLIC PANEL
BUN: 8 mg/dL (ref 6–23)
CO2: 28 mEq/L (ref 19–32)
Calcium: 10.3 mg/dL (ref 8.4–10.5)
Chloride: 97 mEq/L (ref 96–112)
Creatinine, Ser: 0.88 mg/dL (ref 0.50–1.10)
GFR calc Af Amer: 75 mL/min — ABNORMAL LOW (ref >90)
GFR calc non Af Amer: 65 mL/min — ABNORMAL LOW (ref >90)
Glucose, Bld: 145 mg/dL — ABNORMAL HIGH (ref 70–99)
Potassium: 3.9 mEq/L (ref 3.5–5.1)
Sodium: 135 mEq/L (ref 135–145)

## 2011-10-06 MED ORDER — ALBUTEROL (5 MG/ML) CONTINUOUS INHALATION SOLN
15.0000 mg/h | INHALATION_SOLUTION | Freq: Once | RESPIRATORY_TRACT | Status: AC
  Administered 2011-10-06: 15 mg/h via RESPIRATORY_TRACT

## 2011-10-06 MED ORDER — METHYLPREDNISOLONE SODIUM SUCC 125 MG IJ SOLR
125.0000 mg | Freq: Once | INTRAMUSCULAR | Status: AC
  Administered 2011-10-06: 125 mg via INTRAVENOUS
  Filled 2011-10-06: qty 2

## 2011-10-06 MED ORDER — ALBUTEROL (5 MG/ML) CONTINUOUS INHALATION SOLN
15.0000 mg/h | INHALATION_SOLUTION | Freq: Once | RESPIRATORY_TRACT | Status: DC
  Filled 2011-10-06: qty 20

## 2011-10-06 MED ORDER — IPRATROPIUM BROMIDE 0.02 % IN SOLN
0.5000 mg | Freq: Once | RESPIRATORY_TRACT | Status: AC
  Administered 2011-10-06: 0.5 mg via RESPIRATORY_TRACT
  Filled 2011-10-06: qty 2.5

## 2011-10-06 MED ORDER — ALBUTEROL SULFATE (5 MG/ML) 0.5% IN NEBU
15.0000 mg | INHALATION_SOLUTION | Freq: Once | RESPIRATORY_TRACT | Status: DC
  Administered 2011-10-06: 15 mg via RESPIRATORY_TRACT
  Filled 2011-10-06: qty 3

## 2011-10-06 NOTE — ED Notes (Signed)
Called [phone] to speak with nurse that is receiving this pt.  Report given to

## 2011-10-06 NOTE — ED Notes (Signed)
Diabetic consistent tray ordered for pt.  NAD, no verbal complaints at this time

## 2011-10-06 NOTE — ED Provider Notes (Signed)
History     CSN: [id number] Arrival date & time: 10/06/2011  4:23 PM   First MD Initiated Contact with Patient 10/06/11 1656      Chief Complaint  Patient presents with  . Shortness of Breath    (Consider location/radiation/quality/duration/timing/severity/associated sxs/prior treatment) Patient is a 70 y.o. female presenting with shortness of breath. The history is provided by the patient.  Shortness of Breath  Associated symptoms include shortness of breath. Pertinent negatives include no chest pain.   Patient presents to the emergency department today with shortness of breath that has been getting worse over the last 3 weeks.  Patient states that she has COPD and was seen by her doctor x2 for this shortness of breath.  She states that she was placed on 2 different antibiotics during this time frame.  She states that she was feeling some better and then this week started feeling worse again.  She states that she began using her nebulized treatments and Mucinex.  She states that she has increased mucous production since doing this.  The patient denies any chest pain, nausea, vomiting, diarrhea, weakness, numbness, headache, sore throat, runny nose, or fever.  She says, that she has felt hot, but not taken her temperature. Past Medical History  Diagnosis Date  . COPD (chronic obstructive pulmonary disease)   . Hypertension   . Diabetes mellitus   . Asthma   . Psoriatic arthritis   . Psoriasis   . GERD (gastroesophageal reflux disease)   . Hiatal hernia   . Chronic sinus infection     History reviewed. No pertinent past surgical history.  History reviewed. No pertinent family history.  History  Substance Use Topics  . Smoking status: Current Everyday Smoker  . Smokeless tobacco: Not on file  . Alcohol Use: No    OB History    Grav Para Term Preterm Abortions TAB SAB Ect Mult Living                  Review of Systems  Respiratory: Positive for shortness of breath.     Cardiovascular: Negative for chest pain and palpitations.  Gastrointestinal: Negative for abdominal distention.  Musculoskeletal: Negative for back pain.    Allergies  Lotrel and Raptiva  Home Medications   Current Outpatient Rx  Name Route Sig Dispense Refill  . ALBUTEROL SULFATE HFA 108 (90 BASE) MCG/ACT IN AERS Inhalation Inhale 2 puffs into the lungs every 6 (six) hours as needed. FOR SHORTNESS OF BREATH     . BRINZOLAMIDE 1 % OP SUSP Right Eye Place 1 drop into the right eye 3 (three) times daily.      Marland Kitchen ESOMEPRAZOLE MAGNESIUM 40 MG PO CPDR Oral Take 40 mg by mouth daily.      Marland Kitchen FLUTICASONE PROPIONATE 50 MCG/ACT NA SUSP Nasal Place 2 sprays into the nose daily as needed. FOR ALLERGIES     . FLUTICASONE-SALMETEROL 250-50 MCG/DOSE IN AEPB Inhalation Inhale 1 puff into the lungs every 12 (twelve) hours.      . GLYBURIDE-METFORMIN 2.5-500 MG PO TABS Oral Take 1 tablet by mouth daily with breakfast.      . OLMESARTAN MEDOXOMIL-HCTZ 40-12.5 MG PO TABS Oral Take 1 tablet by mouth daily.      Marland Kitchen PREDNISONE 5 MG PO TABS Oral Take 5 mg by mouth daily. MON WED AND FRI     . RANITIDINE HCL 150 MG PO CAPS Oral Take 150 mg by mouth at bedtime.      Marland Kitchen  THEOPHYLLINE 200 MG PO TB12 Oral Take 200 mg by mouth 2 (two) times daily.      . TRAVOPROST (BAK FREE) 0.004 % OP SOLN  1 drop at bedtime.        BP 155/71  Pulse 101  Temp(Src) 98.9 F (37.2 C) (Oral)  Resp 24  SpO2 96%  Physical Exam  ED Course  Procedures (including critical care time)   Labs Reviewed  CBC  DIFFERENTIAL  BASIC METABOLIC PANEL  URINALYSIS, ROUTINE W REFLEX MICROSCOPIC   No results found.   No diagnosis found.  Patient will receive an hour-long nebulizer treatment, along with steroids. 1710 Results for orders placed during the hospital encounter of 10/06/11  CBC      Component Value Range   WBC 11.1 (*) 4.0 - 10.5 (K/uL)   RBC 3.79 (*) 3.87 - 5.11 (MIL/uL)   Hemoglobin 9.8 (*) 12.0 - 15.0 (g/dL)   HCT  16.1 (*) 09.6 - 46.0 (%)   MCV 85.0  78.0 - 100.0 (fL)   MCH 25.9 (*) 26.0 - 34.0 (pg)   MCHC 30.4  30.0 - 36.0 (g/dL)   RDW 04.5 (*) 40.9 - 15.5 (%)   Platelets 255  150 - 400 (K/uL)  DIFFERENTIAL      Component Value Range   Neutrophils Relative 69  43 - 77 (%)   Neutro Abs 7.6  1.7 - 7.7 (K/uL)   Lymphocytes Relative 18  12 - 46 (%)   Lymphs Abs 2.0  0.7 - 4.0 (K/uL)   Monocytes Relative 9  3 - 12 (%)   Monocytes Absolute 1.0  0.1 - 1.0 (K/uL)   Eosinophils Relative 4  0 - 5 (%)   Eosinophils Absolute 0.5  0.0 - 0.7 (K/uL)   Basophils Relative 0  0 - 1 (%)   Basophils Absolute 0.0  0.0 - 0.1 (K/uL)  BASIC METABOLIC PANEL      Component Value Range   Sodium 135  135 - 145 (mEq/L)   Potassium 3.9  3.5 - 5.1 (mEq/L)   Chloride 97  96 - 112 (mEq/L)   CO2 28  19 - 32 (mEq/L)   Glucose, Bld 145 (*) 70 - 99 (mg/dL)   BUN 8  6 - 23 (mg/dL)   Creatinine, Ser 8.11  0.50 - 1.10 (mg/dL)   Calcium 91.4  8.4 - 10.5 (mg/dL)   GFR calc non Af Amer 65 (*) >90 (mL/min)   GFR calc Af Amer 75 (*) >90 (mL/min)  URINALYSIS, ROUTINE W REFLEX MICROSCOPIC      Component Value Range   Color, Urine YELLOW  YELLOW    Appearance CLEAR  CLEAR    Specific Gravity, Urine 1.007  1.005 - 1.030    pH 7.0  5.0 - 8.0    Glucose, UA NEGATIVE  NEGATIVE (mg/dL)   Hgb urine dipstick NEGATIVE  NEGATIVE    Bilirubin Urine NEGATIVE  NEGATIVE    Ketones, ur NEGATIVE  NEGATIVE (mg/dL)   Protein, ur NEGATIVE  NEGATIVE (mg/dL)   Urobilinogen, UA 0.2  0.0 - 1.0 (mg/dL)   Nitrite NEGATIVE  NEGATIVE    Leukocytes, UA NEGATIVE  NEGATIVE    Dg Cervical Spine Complete  09/08/2011  *RADIOLOGY REPORT*  Clinical Data: 70 year old female with neck pain on the left, jaw pain.  CERVICAL SPINE - COMPLETE 4+ VIEW  Comparison: 05/21/2009 and earlier.  Findings: Stable cervical vertebral height and alignment.  Stable prevertebral soft tissues.  Severe disc space loss C4-C5, C5-C6,  C6- C7.  Lower cervical facet hypertrophy.   Moderate to severe osseous foraminal stenosis on the right at C6-C7. Bilateral posterior element alignment is within normal limits.  Cervicothoracic junction alignment is within normal limits.  Stable AP alignment. Lung apices are clear.  C1-2 alignment and odontoid within normal limits. Increased thoracic kyphosis appears grossly stable.  IMPRESSION: Stable cervical spine.  Advanced multilevel cervical disc and facet degeneration.  Original Report Authenticated By: Harley Hallmark, M.D.   Dg Chest Portable 1 View  10/06/2011  *RADIOLOGY REPORT*  Clinical Data: Shortness of breath, wheezing  PORTABLE CHEST - 1 VIEW  Comparison: 09/14/2010  Findings: Mild cardiomegaly.  Low lung volumes with resultant crowding of bronchovascular structures most marked at the lung bases.  No confluent airspace infiltrate or overt edema.  No effusion.  IMPRESSION:  1.  Mild cardiomegaly  Original Report Authenticated By: Osa Craver, M.D.      MDM  10:10 PM  The patient will be admitted to the hospital by the hospitalist for COPD exacerbation.        [name], [place] 10/06/11 2212

## 2011-10-06 NOTE — ED Notes (Signed)
Hospitalist at [facility] for evaluation of pt for admission

## 2011-10-06 NOTE — ED Notes (Signed)
Pt reports decreased respiratory effort after nebulizer treatment.  NAD, no verbal complaints at this time.

## 2011-10-06 NOTE — ED Provider Notes (Signed)
Patient has a history of COPD and chronic bronchitis. She relates she had a sinus infection and was treated with Cipro and then 15 days of Ceftin which she finished in October and was feeling a little better however about 6 days ago she started feeling fatigued and 3 days ago had fever of 102. She relates she feels like her abdomen is swollen and it makes it hard for her to breathe. She relates she does her nebulizer at home twice a day. Patient is alert and cooperative she is in mild respiratory distress however when I listen to her lungs she has diffuse wheezing and rhonchi in all lung fields. Her abdomen is distended and soft with some mild discomfort in the epigastric area. Her primary care physician is Dr. Petra Kuba  Medical screening examination/treatment/procedure(s) were conducted as a shared visit with non-physician practitioner(s) and myself.  I personally evaluated the patient during the encounter. Devoria Albe, MD, Armando Gang    Ward Givens, MD 10/06/11 [phone]

## 2011-10-06 NOTE — ED Notes (Signed)
Pt c/o SOB x several days; pt sts trouble with reflux that is making breathing worse; pt sts increased gas; pt sts hx of recent URI and fever; pt sts some palpitations today but sts was better after burping; pt tachypnic at present

## 2011-10-06 NOTE — H&P (Signed)
[name] is an 70 y.o. female.   Chief Complaint: Shortness of breath HPI: 70 year old female with H/O COPD and on going tobacco abuse has been having symptoms of upper respiratory tract infections for last three weeks for which she had an antibiotic course thru her PCP. But three days ago she experienced fever and shortness of breath worsened. In the ER patient is found wheezing despite nebulizer treatment. In addition patient has sensation of palliations off and on and chest tightness. Denies any nausea and vomiting or diarrhea.  Past Medical History  Diagnosis Date  . COPD (chronic obstructive pulmonary disease)   . Hypertension   . Diabetes mellitus   . Asthma   . Psoriatic arthritis   . Psoriasis   . GERD (gastroesophageal reflux disease)   . Hiatal hernia   . Chronic sinus infection     Past Surgical History  Procedure Date  . Nasal sinus surgery     Family History  Problem Relation Age of Onset  . Lupus Sister   . Lung cancer Sister    Social History:  reports that she has been smoking.  She does not have any smokeless tobacco history on file. She reports that she does not drink alcohol or use illicit drugs.  Allergies:  Allergies  Allergen Reactions  . Penicillins Cross Reactors Anaphylaxis  . Lotrel Other (See Comments)    Nervous/shakiness  . Raptiva (Efalizumab) Rash    Medications Prior to Admission  Medication Dose Route Frequency Provider Last Rate Last Dose  . albuterol (PROVENTIL,VENTOLIN) solution continuous neb  15 mg/hr Nebulization Once [place], [place]   15 mg/hr at 10/06/11 2104  . ipratropium (ATROVENT) nebulizer solution 0.5 mg  0.5 mg Nebulization Once Carlyle Dolly, PA   0.5 mg at 10/06/11 1730  . methylPREDNISolone sodium succinate (SOLU-MEDROL) 125 MG injection 125 mg  125 mg Intravenous Once [employer], [place]   125 mg at 10/06/11 1730  . DISCONTD: albuterol (PROVENTIL) (5 MG/ML) 0.5% nebulizer solution 15 mg  15  mg Nebulization Once [employer], [place]   15 mg at 10/06/11 2015  . DISCONTD: albuterol (PROVENTIL,VENTOLIN) solution continuous neb  15 mg/hr Nebulization Once Carlyle Dolly, [place]       No current outpatient prescriptions on file as of 10/06/2011.    Results for orders placed during the hospital encounter of 10/06/11 (from the past 48 hour(s))  CBC     Status: Abnormal   Collection Time   10/06/11  5:24 PM      Component Value Range Comment   WBC 11.1 (*) 4.0 - 10.5 (K/uL)    RBC 3.79 (*) 3.87 - 5.11 (MIL/uL)    Hemoglobin 9.8 (*) 12.0 - 15.0 (g/dL)    HCT 16.1 (*) 09.6 - 46.0 (%)    MCV 85.0  78.0 - 100.0 (fL)    MCH 25.9 (*) 26.0 - 34.0 (pg)    MCHC 30.4  30.0 - 36.0 (g/dL)    RDW 04.5 (*) 40.9 - 15.5 (%)    Platelets 255  150 - 400 (K/uL)   DIFFERENTIAL     Status: Normal   Collection Time   10/06/11  5:24 PM      Component Value Range Comment   Neutrophils Relative 69  43 - 77 (%)    Neutro Abs 7.6  1.7 - 7.7 (K/uL)    Lymphocytes Relative 18  12 - 46 (%)    Lymphs Abs 2.0  0.7 - 4.0 (K/uL)    Monocytes Relative 9  3 - 12 (%)    Monocytes Absolute 1.0  0.1 - 1.0 (K/uL)    Eosinophils Relative 4  0 - 5 (%)    Eosinophils Absolute 0.5  0.0 - 0.7 (K/uL)    Basophils Relative 0  0 - 1 (%)    Basophils Absolute 0.0  0.0 - 0.1 (K/uL)   BASIC METABOLIC PANEL     Status: Abnormal   Collection Time   10/06/11  5:24 PM      Component Value Range Comment   Sodium 135  135 - 145 (mEq/L)    Potassium 3.9  3.5 - 5.1 (mEq/L)    Chloride 97  96 - 112 (mEq/L)    CO2 28  19 - 32 (mEq/L)    Glucose, Bld 145 (*) 70 - 99 (mg/dL)    BUN 8  6 - 23 (mg/dL)    Creatinine, Ser 2.95  0.50 - 1.10 (mg/dL)    Calcium 62.1  8.4 - 10.5 (mg/dL)    GFR calc non Af Amer 65 (*) >90 (mL/min)    GFR calc Af Amer 75 (*) >90 (mL/min)   URINALYSIS, ROUTINE W REFLEX MICROSCOPIC     Status: Normal   Collection Time   10/06/11  6:07 PM      Component Value Range Comment   Color, Urine YELLOW   YELLOW     Appearance CLEAR  CLEAR     Specific Gravity, Urine 1.007  1.005 - 1.030     pH 7.0  5.0 - 8.0     Glucose, UA NEGATIVE  NEGATIVE (mg/dL)    Hgb urine dipstick NEGATIVE  NEGATIVE     Bilirubin Urine NEGATIVE  NEGATIVE     Ketones, ur NEGATIVE  NEGATIVE (mg/dL)    Protein, ur NEGATIVE  NEGATIVE (mg/dL)    Urobilinogen, UA 0.2  0.0 - 1.0 (mg/dL)    Nitrite NEGATIVE  NEGATIVE     Leukocytes, UA NEGATIVE  NEGATIVE  MICROSCOPIC NOT DONE ON URINES WITH NEGATIVE PROTEIN, BLOOD, LEUKOCYTES, NITRITE, OR GLUCOSE <1000 mg/dL.   Dg Chest Portable 1 View  10/06/2011  *RADIOLOGY REPORT*  Clinical Data: Shortness of breath, wheezing  PORTABLE CHEST - 1 VIEW  Comparison: 09/14/2010  Findings: Mild cardiomegaly.  Low lung volumes with resultant crowding of bronchovascular structures most marked at the lung bases.  No confluent airspace infiltrate or overt edema.  No effusion.  IMPRESSION:  1.  Mild cardiomegaly  Original Report Authenticated By: Osa Craver, M.D.    Review of Systems  Constitutional: Negative.   HENT: Positive for congestion.   Eyes: Negative.   Respiratory: Positive for cough, sputum production, shortness of breath and wheezing.   Cardiovascular: Positive for palpitations.  Gastrointestinal: Negative.   Genitourinary: Negative.   Musculoskeletal: Negative.   Skin: Negative.   Neurological: Negative.   Endo/Heme/Allergies: Negative.   Psychiatric/Behavioral: Negative.     Blood pressure 152/55, pulse 105, temperature 98.8 F (37.1 C), temperature source Oral, resp. rate 23, SpO2 94.00%. Physical Exam  Constitutional: She is oriented to person, place, and time. She appears well-developed and well-nourished.  HENT:  Head: Normocephalic and atraumatic.  Eyes: Conjunctivae and EOM are normal. Pupils are equal, round, and reactive to light.  Neck: Normal range of motion. Neck supple.  Cardiovascular: Normal rate and regular rhythm.   Respiratory: She has  wheezes.  GI: Soft. Bowel sounds are normal.  Musculoskeletal: Normal range of  motion.  Neurological: She is alert and oriented to person, place, and time. She has normal reflexes.  Skin: Skin is warm. Rash noted.  Psychiatric: Her behavior is normal.     Assessment/Plan 1)COPD exacerbation 2)Palpitations 3)Anemia with h/o iron deficiency anemia 4)Ongoing tobacco abuse 5)Hypertension 6)H/OPsoriasis  Plan:  Admit to telemetry For her COPD exacerbation will place patient on zithromax, nebulizer, and steroids. For palliations will check thyroid function test hold theophylline for now and if possible get a theophylline level. Patient states she has anemia and is well known, will closely follow her CBC. Will also cycle cardiac markers as patient has occasional chest tightness.  Eduard Clos 10/06/2011, 11:37 PM

## 2011-10-06 NOTE — ED Provider Notes (Signed)
See prior note  Medical screening examination/treatment/procedure(s) were conducted as a shared visit with non-physician practitioner(s) and myself.  I personally evaluated the patient during the encounter Devoria Albe, MD, Franz Dell, MD 10/06/11 2214

## 2011-10-06 NOTE — ED Notes (Signed)
Pt resting quietly, NAD, no respiratory distress noted.  No verbal complaints at this time.

## 2011-10-07 ENCOUNTER — Encounter (HOSPITAL_COMMUNITY): Payer: Self-pay | Admitting: General Practice

## 2011-10-07 LAB — T4, FREE: Free T4: 0.86 ng/dL (ref 0.80–1.80)

## 2011-10-07 LAB — GLUCOSE, CAPILLARY
Glucose-Capillary: 245 mg/dL — ABNORMAL HIGH (ref 70–99)
Glucose-Capillary: 170 mg/dL — ABNORMAL HIGH (ref 70–99)

## 2011-10-07 LAB — COMPREHENSIVE METABOLIC PANEL
ALT: 15 U/L (ref 0–35)
AST: 18 U/L (ref 0–37)
Calcium: 10.1 mg/dL (ref 8.4–10.5)
Creatinine, Ser: 0.9 mg/dL (ref 0.50–1.10)
GFR calc Af Amer: 73 mL/min — ABNORMAL LOW (ref >90)
Glucose, Bld: 485 mg/dL — ABNORMAL HIGH (ref 70–99)
Sodium: 132 mEq/L — ABNORMAL LOW (ref 135–145)
Total Protein: 7.7 g/dL (ref 6.0–8.3)

## 2011-10-07 LAB — CBC
MCH: 25.9 pg — ABNORMAL LOW (ref 26.0–34.0)
MCHC: 30.3 g/dL (ref 30.0–36.0)
RDW: 18.5 % — ABNORMAL HIGH (ref 11.5–15.5)

## 2011-10-07 LAB — CARDIAC PANEL(CRET KIN+CKTOT+MB+TROPI)
Relative Index: 3.1 — ABNORMAL HIGH (ref 0.0–2.5)
Relative Index: 2.5 (ref 0.0–2.5)
Total CK: 224 U/L — ABNORMAL HIGH (ref 7–177)

## 2011-10-07 LAB — THEOPHYLLINE LEVEL: Theophylline Lvl: 2.1 ug/mL — ABNORMAL LOW (ref 10.0–20.0)

## 2011-10-07 LAB — T3, FREE: T3, Free: 2 pg/mL — ABNORMAL LOW (ref 2.3–4.2)

## 2011-10-07 LAB — TSH: TSH: 0.261 u[IU]/mL — ABNORMAL LOW (ref 0.350–4.500)

## 2011-10-07 MED ORDER — METHYLPREDNISOLONE SODIUM SUCC 40 MG IJ SOLR
40.0000 mg | Freq: Two times a day (BID) | INTRAMUSCULAR | Status: DC
  Administered 2011-10-07 – 2011-10-08 (×3): 40 mg via INTRAVENOUS
  Filled 2011-10-07 (×5): qty 1

## 2011-10-07 MED ORDER — AZITHROMYCIN 250 MG PO TABS
250.0000 mg | ORAL_TABLET | Freq: Every day | ORAL | Status: DC
  Administered 2011-10-07 – 2011-10-08 (×2): 250 mg via ORAL
  Filled 2011-10-07 (×2): qty 1

## 2011-10-07 MED ORDER — GLYBURIDE 2.5 MG PO TABS
2.5000 mg | ORAL_TABLET | Freq: Every day | ORAL | Status: DC
  Administered 2011-10-08: 2.5 mg via ORAL
  Filled 2011-10-07 (×2): qty 1

## 2011-10-07 MED ORDER — BUDESONIDE 0.5 MG/2ML IN SUSP
0.5000 mg | Freq: Two times a day (BID) | RESPIRATORY_TRACT | Status: DC
  Administered 2011-10-07 – 2011-10-08 (×3): 0.5 mg via RESPIRATORY_TRACT
  Filled 2011-10-07 (×5): qty 2

## 2011-10-07 MED ORDER — SODIUM CHLORIDE 0.9 % IJ SOLN
3.0000 mL | Freq: Two times a day (BID) | INTRAMUSCULAR | Status: DC
  Administered 2011-10-07: 3 mL via INTRAVENOUS

## 2011-10-07 MED ORDER — NYSTATIN 100000 UNIT/ML MT SUSP
5.0000 mL | Freq: Four times a day (QID) | OROMUCOSAL | Status: DC
  Administered 2011-10-07 – 2011-10-08 (×4): 500000 [IU] via ORAL
  Filled 2011-10-07 (×9): qty 5

## 2011-10-07 MED ORDER — BRINZOLAMIDE 1 % OP SUSP
1.0000 [drp] | Freq: Three times a day (TID) | OPHTHALMIC | Status: DC
  Filled 2011-10-07: qty 10

## 2011-10-07 MED ORDER — FLUTICASONE PROPIONATE 50 MCG/ACT NA SUSP
2.0000 | Freq: Every day | NASAL | Status: DC
  Administered 2011-10-08: 2 via NASAL
  Filled 2011-10-07: qty 16

## 2011-10-07 MED ORDER — SODIUM CHLORIDE 0.9 % IJ SOLN: 3.0000 mL | INTRAMUSCULAR | Status: DC | PRN

## 2011-10-07 MED ORDER — PANTOPRAZOLE SODIUM 40 MG PO TBEC
40.0000 mg | DELAYED_RELEASE_TABLET | Freq: Every day | ORAL | Status: DC
  Administered 2011-10-07: 40 mg via ORAL
  Filled 2011-10-07: qty 1

## 2011-10-07 MED ORDER — DORZOLAMIDE HCL 2 % OP SOLN
1.0000 [drp] | Freq: Three times a day (TID) | OPHTHALMIC | Status: DC
  Administered 2011-10-07 – 2011-10-08 (×3): 1 [drp] via OPHTHALMIC
  Filled 2011-10-07: qty 10

## 2011-10-07 MED ORDER — THEOPHYLLINE 200 MG PO TB12
200.0000 mg | ORAL_TABLET | Freq: Two times a day (BID) | ORAL | Status: DC
  Administered 2011-10-07 – 2011-10-08 (×4): 200 mg via ORAL
  Filled 2011-10-07 (×5): qty 1

## 2011-10-07 MED ORDER — ONDANSETRON HCL 4 MG PO TABS: 4.0000 mg | ORAL_TABLET | Freq: Four times a day (QID) | ORAL | Status: DC | PRN

## 2011-10-07 MED ORDER — LEVALBUTEROL HCL 0.63 MG/3ML IN NEBU
0.6300 mg | INHALATION_SOLUTION | Freq: Four times a day (QID) | RESPIRATORY_TRACT | Status: DC | PRN
  Filled 2011-10-07: qty 3

## 2011-10-07 MED ORDER — IPRATROPIUM BROMIDE 0.02 % IN SOLN
0.5000 mg | Freq: Four times a day (QID) | RESPIRATORY_TRACT | Status: DC
  Administered 2011-10-07 – 2011-10-08 (×4): 0.5 mg via RESPIRATORY_TRACT
  Filled 2011-10-07 (×5): qty 2.5

## 2011-10-07 MED ORDER — FAMOTIDINE 20 MG PO TABS
20.0000 mg | ORAL_TABLET | Freq: Every day | ORAL | Status: DC
  Filled 2011-10-07: qty 1

## 2011-10-07 MED ORDER — ACETAMINOPHEN 325 MG PO TABS
650.0000 mg | ORAL_TABLET | Freq: Four times a day (QID) | ORAL | Status: DC | PRN
Start: 2011-10-07 — End: 2011-10-08
  Administered 2011-10-07 – 2011-10-08 (×2): 650 mg via ORAL
  Filled 2011-10-07 (×2): qty 2

## 2011-10-07 MED ORDER — METFORMIN HCL 500 MG PO TABS
500.0000 mg | ORAL_TABLET | Freq: Every day | ORAL | Status: DC
  Administered 2011-10-08: 500 mg via ORAL
  Filled 2011-10-07 (×2): qty 1

## 2011-10-07 MED ORDER — ALUM & MAG HYDROXIDE-SIMETH 400-400-40 MG/5ML PO SUSP
15.0000 mL | Freq: Four times a day (QID) | ORAL | Status: DC | PRN
  Administered 2011-10-08: 15 mL via ORAL
  Filled 2011-10-07 (×2): qty 30

## 2011-10-07 MED ORDER — INSULIN GLARGINE 100 UNIT/ML ~~LOC~~ SOLN
10.0000 [IU] | Freq: Every day | SUBCUTANEOUS | Status: DC
  Administered 2011-10-07: 10 [IU] via SUBCUTANEOUS
  Filled 2011-10-07: qty 3

## 2011-10-07 MED ORDER — LEVALBUTEROL HCL 0.63 MG/3ML IN NEBU
0.6300 mg | INHALATION_SOLUTION | Freq: Four times a day (QID) | RESPIRATORY_TRACT | Status: DC
  Administered 2011-10-07 (×2): 0.63 mg via RESPIRATORY_TRACT
  Filled 2011-10-07 (×5): qty 3

## 2011-10-07 MED ORDER — DEXTROSE 5 % IV SOLN
500.0000 mg | INTRAVENOUS | Status: DC
  Administered 2011-10-07: 500 mg via INTRAVENOUS
  Filled 2011-10-07: qty 500

## 2011-10-07 MED ORDER — HYDROCHLOROTHIAZIDE 12.5 MG PO CAPS
12.5000 mg | ORAL_CAPSULE | Freq: Every day | ORAL | Status: DC
  Administered 2011-10-07 – 2011-10-08 (×2): 12.5 mg via ORAL
  Filled 2011-10-07 (×2): qty 1

## 2011-10-07 MED ORDER — LEVALBUTEROL HCL 0.63 MG/3ML IN NEBU
0.6300 mg | INHALATION_SOLUTION | Freq: Four times a day (QID) | RESPIRATORY_TRACT | Status: DC
  Administered 2011-10-07 – 2011-10-08 (×4): 0.63 mg via RESPIRATORY_TRACT
  Filled 2011-10-07 (×5): qty 3

## 2011-10-07 MED ORDER — GLYBURIDE-METFORMIN 2.5-500 MG PO TABS: 1.0000 | ORAL_TABLET | Freq: Every day | ORAL | Status: DC

## 2011-10-07 MED ORDER — ACETAMINOPHEN 650 MG RE SUPP: 650.0000 mg | Freq: Four times a day (QID) | RECTAL | Status: DC | PRN

## 2011-10-07 MED ORDER — ONDANSETRON HCL 4 MG/2ML IJ SOLN: 4.0000 mg | Freq: Four times a day (QID) | INTRAMUSCULAR | Status: DC | PRN

## 2011-10-07 MED ORDER — OLMESARTAN MEDOXOMIL 40 MG PO TABS
40.0000 mg | ORAL_TABLET | Freq: Every day | ORAL | Status: DC
  Administered 2011-10-07 – 2011-10-08 (×2): 40 mg via ORAL
  Filled 2011-10-07 (×2): qty 1

## 2011-10-07 MED ORDER — TRAVOPROST (BAK FREE) 0.004 % OP SOLN
1.0000 [drp] | Freq: Every day | OPHTHALMIC | Status: DC
  Administered 2011-10-07: 1 [drp] via OPHTHALMIC
  Filled 2011-10-07: qty 2.5

## 2011-10-07 MED ORDER — INSULIN ASPART 100 UNIT/ML ~~LOC~~ SOLN
0.0000 [IU] | Freq: Three times a day (TID) | SUBCUTANEOUS | Status: DC
  Administered 2011-10-07: 15 [IU] via SUBCUTANEOUS
  Administered 2011-10-07: 5 [IU] via SUBCUTANEOUS
  Administered 2011-10-07: 3 [IU] via SUBCUTANEOUS
  Administered 2011-10-08: 2 [IU] via SUBCUTANEOUS
  Filled 2011-10-07: qty 3

## 2011-10-07 MED ORDER — OLMESARTAN MEDOXOMIL-HCTZ 40-12.5 MG PO TABS: 1.0000 | ORAL_TABLET | Freq: Every day | ORAL | Status: DC

## 2011-10-07 MED ORDER — SODIUM CHLORIDE 0.9 % IV SOLN
250.0000 mL | INTRAVENOUS | Status: DC
  Administered 2011-10-07: 250 mL via INTRAVENOUS

## 2011-10-07 MED ORDER — IPRATROPIUM BROMIDE 0.02 % IN SOLN
0.5000 mg | RESPIRATORY_TRACT | Status: DC
  Administered 2011-10-07 (×2): 0.5 mg via RESPIRATORY_TRACT
  Filled 2011-10-07 (×2): qty 2.5

## 2011-10-07 NOTE — Progress Notes (Signed)
Received from lab ckmb 6.9

## 2011-10-07 NOTE — Progress Notes (Signed)
UTILIZATION REVIEW COMPLETE [name] 10/07/2011 [phone] OR [id number]

## 2011-10-07 NOTE — Progress Notes (Signed)
Inpatient Diabetes Program Recommendations  AACE/ADA: New Consensus Statement on Inpatient Glycemic Control (2009)  Target Ranges:  Prepandial:   less than 140 mg/dL      Peak postprandial:   less than 180 mg/dL (1-2 hours)      Critically ill patients:  140 - 180 mg/dL   Reason for Visit: CBGs today 405mg /dl, 161 mg/dl  Inpatient Diabetes Program Recommendations Insulin - Basal: May need Lantus 10 units daily if CBGs continue greater than 180 mg/dl and while on steroids. HgbA1C: Check HgbA1c for home blood glucose control  Note:

## 2011-10-07 NOTE — Progress Notes (Signed)
Patient has a CBG of 405. MD Dhungel paged twice. No phone call returned. On coming nurse made aware and will follow up.

## 2011-10-07 NOTE — Progress Notes (Signed)
Subjective: Patient seen and examined this morning. Informs her shortness of breath to be better. Complains of some throat pain on swallowing.  Objective:  Vital signs in last 24 hours:  Filed Vitals:   10/07/11 0541 10/07/11 0822 10/07/11 1134 10/07/11 1404  BP: 136/62     Pulse: 78  85   Temp: 97.7 F (36.5 C)     TempSrc:      Resp: 20  19   Height:      Weight:      SpO2: 94% 98% 95% 97%    Intake/Output from previous day:   Intake/Output Summary (Last 24 hours) at 10/07/11 1457 Last data filed at 10/07/11 1300  Gross per 24 hour  Intake 960.92 ml  Output   2250 ml  Net -1289.08 ml    Physical Exam:  General: Elderly female sitting up in in no acute distress. HEENT: no pallor, no icterus, moist oral mucosa, no JVD, no lymphadenopathy Heart: Normal  s1 &s2  Regular rate and rhythm, without murmurs, rubs, gallops. Lungs: Scattered wheezes, no crackles Abdomen: Soft, nontender, nondistended, positive bowel sounds. Extremities: No clubbing cyanosis or edema  Neuro: Alert, awake, oriented x3, nonfocal. No tremors   Lab Results:  Basic Metabolic Panel:    Component Value Date/Time   NA 132* 10/07/2011 0540   K 4.3 10/07/2011 0540   CL 93* 10/07/2011 0540   CO2 22 10/07/2011 0540   BUN 12 10/07/2011 0540   CREATININE 0.90 10/07/2011 0540   GLUCOSE 485* 10/07/2011 0540   CALCIUM 10.1 10/07/2011 0540   CBC:    Component Value Date/Time   WBC 9.3 10/07/2011 0540   HGB 9.9* 10/07/2011 0540   HCT 32.7* 10/07/2011 0540   PLT 247 10/07/2011 0540   MCV 85.6 10/07/2011 0540   NEUTROABS 7.6 10/06/2011 1724   LYMPHSABS 2.0 10/06/2011 1724   MONOABS 1.0 10/06/2011 1724   EOSABS 0.5 10/06/2011 1724   BASOSABS 0.0 10/06/2011 1724    No results found for this or any previous visit (from the past 240 hour(s)).  Studies/Results: Dg Chest Portable 1 View  10/06/2011  *RADIOLOGY REPORT*  Clinical Data: Shortness of breath, wheezing  PORTABLE CHEST - 1 VIEW  Comparison: 09/14/2010   Findings: Mild cardiomegaly.  Low lung volumes with resultant crowding of bronchovascular structures most marked at the lung bases.  No confluent airspace infiltrate or overt edema.  No effusion.  IMPRESSION:  1.  Mild cardiomegaly  Original Report Authenticated By: Osa Craver, M.D.    Medications: Scheduled Meds:   . albuterol  15 mg/hr Nebulization Once  . azithromycin  250 mg Oral Daily  . budesonide  0.5 mg Nebulization BID  . dorzolamide  1 drop Right Eye TID  . fluticasone  2 spray Each Nare Daily  . glyBURIDE  2.5 mg Oral Q breakfast  . olmesartan  40 mg Oral Daily   And  . hydrochlorothiazide  12.5 mg Oral Daily  . insulin aspart  0-15 Units Subcutaneous TID WC  . insulin glargine  10 Units Subcutaneous QHS  . ipratropium  0.5 mg Nebulization Once  . ipratropium  0.5 mg Nebulization Q6H  . levalbuterol  0.63 mg Nebulization Q6H  . metFORMIN  500 mg Oral Q breakfast  . methylPREDNISolone (SOLU-MEDROL) injection  125 mg Intravenous Once  . methylPREDNISolone (SOLU-MEDROL) injection  40 mg Intravenous Q12H  . nystatin  5 mL Oral QID  . pantoprazole  40 mg Oral QAC lunch  . theophylline  200 mg Oral BID  . Travoprost (BAK Free)  1 drop Both Eyes QHS  . DISCONTD: albuterol  15 mg Nebulization Once  . DISCONTD: albuterol  15 mg/hr Nebulization Once  . DISCONTD: azithromycin  500 mg Intravenous Q24H  . DISCONTD: brinzolamide  1 drop Right Eye TID  . DISCONTD: famotidine  20 mg Oral Daily  . DISCONTD: glyBURIDE-metformin  1 tablet Oral Q breakfast  . DISCONTD: ipratropium  0.5 mg Nebulization Q4H  . DISCONTD: levalbuterol  0.63 mg Nebulization Q6H  . DISCONTD: olmesartan-hydrochlorothiazide  1 tablet Oral Daily  . DISCONTD: sodium chloride  3 mL Intravenous Q12H   Continuous Infusions:   . sodium chloride Stopped (10/07/11 0300)   PRN Meds:.acetaminophen, alum & mag hydroxide-simeth, levalbuterol, ondansetron (ZOFRAN) IV, DISCONTD: acetaminophen, DISCONTD:  ondansetron, DISCONTD: sodium chloride  Assessment: 70 year old female with a history of COPD not on home 2, diabetes mellitus type 2, iron deficiency anemia, hypertension, psoriasis, chronic dysphagia with GERD presented with worsening shortness of breath with wheezing and the URI symptoms for last 2 weeks with recent outpatient antibiotic course.  Plan #1 acute COPD exacerbation Patient admitted to medical floor and given scheduled albuterol and Atrovent nebulizer Also start her on IV Solu-Medrol The symptoms have been improving Continue albuterol and budesonide  Inhaler Switch to oral prednisone from tomorrow if improved She informs on resuming to smoke since his recently and  has been counseled to stop smoking O2 sats stable on room air Patient on theophylline at home level  therapeutic   #2 diabetes mellitus type 2 Patient on oral hypoglycemics at home. Noted for elevated blood glucose likely due to steroid use Would resume her home medications, sliding scale insulin and will also order 10 units Lantus at bedtime.  #3 GERD Patient complained of some chest discomfort on admission which has resolved and is likely due to her underlying GERD. We'll resume her Nexium and also order  Maalox Patient also complains off pain while swallowing and also gives history of oral candidiasis. I will order nystatin swish and swallow.  #4 palpitations Currently stable Likely secondary to anxiety and COPD exacerbation  patient complained of some chest tightness on admission, her EKG is unremarkable, troponin negative, she did have some elevated CK-MB however it is asymptomatic. TSH mildly low but with normal T4   #5 hypertension Continue Benicar and HCTZ  #5 psoriasis Takes Humira at home  stable  Diet diabetic   Full code   LOS: 1 day   Graycen Sadlon 10/07/2011, 2:57 PM

## 2011-10-08 ENCOUNTER — Other Ambulatory Visit: Payer: Self-pay

## 2011-10-08 LAB — BASIC METABOLIC PANEL
BUN: 19 mg/dL (ref 6–23)
CO2: 28 mEq/L (ref 19–32)
Chloride: 93 mEq/L — ABNORMAL LOW (ref 96–112)
Creatinine, Ser: 0.85 mg/dL (ref 0.50–1.10)
GFR calc Af Amer: 78 mL/min — ABNORMAL LOW (ref >90)
Glucose, Bld: 170 mg/dL — ABNORMAL HIGH (ref 70–99)
Potassium: 4.8 mEq/L (ref 3.5–5.1)

## 2011-10-08 LAB — CARDIAC PANEL(CRET KIN+CKTOT+MB+TROPI)
CK, MB: 16.4 ng/mL (ref 0.3–4.0)
Relative Index: 2.7 — ABNORMAL HIGH (ref 0.0–2.5)
Troponin I: <0.3 ng/mL (ref <0.30)

## 2011-10-08 LAB — GLUCOSE, CAPILLARY

## 2011-10-08 MED ORDER — ALUM & MAG HYDROXIDE-SIMETH 400-400-40 MG/5ML PO SUSP: 15.0000 mL | Freq: Four times a day (QID) | ORAL | Status: AC | PRN

## 2011-10-08 MED ORDER — AZITHROMYCIN 250 MG PO TABS: ORAL_TABLET | ORAL | Status: AC

## 2011-10-08 MED ORDER — NYSTATIN 100000 UNIT/ML MT SUSP: 5.0000 mL | Freq: Four times a day (QID) | OROMUCOSAL | Status: DC

## 2011-10-08 MED ORDER — PREDNISONE 20 MG PO TABS
40.0000 mg | ORAL_TABLET | Freq: Every day | ORAL | Status: DC
  Filled 2011-10-08: qty 2

## 2011-10-08 MED ORDER — PREDNISONE 20 MG PO TABS: 40.0000 mg | ORAL_TABLET | Freq: Every day | ORAL | Status: AC

## 2011-10-08 NOTE — Plan of Care (Signed)
Problem: Consults Goal: Diabetes Guidelines if Diabetic/Glucose > 140 If diabetic or lab glucose is > 140 mg/dl - Initiate Diabetes/Hyperglycemia Guidelines & Document Interventions  Outcome: Progressing AC/HS CBG monitoring

## 2011-10-08 NOTE — Plan of Care (Signed)
If patient  Have chf  Pt. Will need echo. Thanks. Burr Medico RN [phone]

## 2011-10-08 NOTE — Discharge Summary (Signed)
Patient ID: [name] MRN: [id number] DOB/AGE: 1940-12-20 70 y.o.  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 Qty: 360 mL, Refills: 0    azithromycin (ZITHROMAX) 250 MG tablet Take 1 tablet (250 mg total) by mouth daily. MAY ADMINISTER WITHOUT REGARD TO MEALS Qty: 3 each, Refills: 0    nystatin (MYCOSTATIN) 100000 UNIT/ML suspension Take 5 mLs (500,000 Units total) by mouth 4 (four) times daily. Qty: 40 mL, Refills: 0    !! predniSONE (DELTASONE) 20 MG tablet Take 2 tablets (40 mg total) by mouth daily. Qty: 5 tablet, Refills: 0     !! - Potential duplicate medications found. Please discuss with provider.    CONTINUE these medications which have NOT CHANGED   Details  albuterol (PROVENTIL HFA;VENTOLIN HFA) 108 (90 BASE) MCG/ACT inhaler Inhale 2 puffs into the lungs every 6 (six) hours as needed. FOR SHORTNESS OF BREATH     brinzolamide (AZOPT) 1 % ophthalmic suspension Place 1 drop into the right eye 3 (three) times daily.      esomeprazole (NEXIUM) 40 MG capsule Take 40 mg by mouth daily.      fluticasone (FLONASE) 50 MCG/ACT nasal spray Place 2 sprays into the nose daily as needed. FOR ALLERGIES     Fluticasone-Salmeterol (ADVAIR) 250-50 MCG/DOSE AEPB Inhale 1 puff into the lungs  every 12 (twelve) hours.      glyBURIDE-metformin (GLUCOVANCE) 2.5-500 MG per tablet Take 1 tablet by mouth daily with breakfast.      olmesartan-hydrochlorothiazide (BENICAR HCT) 40-12.5 MG per tablet Take 1 tablet by mouth daily.      !! predniSONE (DELTASONE) 5 MG tablet Take 5 mg by mouth daily. MON WED AND FRI     ranitidine (ZANTAC) 150 MG capsule Take 150 mg by mouth at bedtime.      theophylline (THEODUR) 200 MG 12 hr tablet Take 200 mg by mouth 2 (two) times daily.      Travoprost, BAK Free, (TRAVATAMN) 0.004 % SOLN ophthalmic solution 1 drop at bedtime.           Medication started Prednisone 40 mg po daily for 5 days maalox 15 mg q6hr prn Nystatin swish and swallow for total 10 days  Disposition and Follow-up:  Follow up with PCP in 1-2 weeks  Consults:  none  Significant Diagnostic Studies:  Dg Chest Portable 1 View  10/06/2011  *RADIOLOGY REPORT*  Clinical Data: Shortness of breath, wheezing  PORTABLE CHEST - 1 VIEW  Comparison: 09/14/2010  Findings: Mild cardiomegaly.  Low lung volumes with resultant crowding of bronchovascular structures most marked at the lung bases.  No confluent airspace infiltrate or overt edema.  No effusion.  IMPRESSION:  1.  Mild cardiomegaly  Original Report Authenticated By: Osa Craver, M.D.    Brief H and P: For complete details please refer to admission H and P, but in brief 44 female  with hx  COPD, chr dysphasia, DM type 2, HTN, and on going tobacco abuse has been having symptoms of upper respiratory tract infections for last three weeks for which she had an antibiotic course through her PCP. But three days ago she experienced fever and shortness of breath worsened. In the ER patient is found wheezing despite nebulizer treatment. In addition patient has sensation of palpiations off and on and chest tightness.   Physical Exam on Discharge:  Filed Vitals:   10/07/11 2016 10/07/11 2050 10/08/11 0148 10/08/11 0521  BP:  138/68   151/69  Pulse: 79 75 76 60  Temp:  98.2 F (36.8 C)  98.1 F (36.7 C)  TempSrc:      Resp: 19 20 20 18   Height:      Weight:    85.911 kg (189 lb 6.4 oz)  SpO2: 100% 96%  97%     Intake/Output Summary (Last 24 hours) at 10/08/11 1113 Last data filed at 10/08/11 1059  Gross per 24 hour  Intake   1320 ml  Output   3050 ml  Net  -1730 ml   General: Elderly female sitting up in in no acute distress.  HEENT: no pallor, no icterus, moist oral mucosa, no JVD, no lymphadenopathy  Heart: Normal s1 &s2 Regular rate and rhythm, without murmurs, rubs, gallops.  Lungs: few Scattered wheezes, no crackles  Abdomen: Soft, nontender, nondistended, positive bowel sounds.  Extremities: No clubbing cyanosis or edema  Neuro: Alert, awake, oriented x3, nonfocal. No tremors   CBC:    Component Value Date/Time   WBC 9.3 10/07/2011 0540   HGB 9.9* 10/07/2011 0540   HCT 32.7* 10/07/2011 0540   PLT 247 10/07/2011 0540   MCV 85.6 10/07/2011 0540   NEUTROABS 7.6 10/06/2011 1724   LYMPHSABS 2.0 10/06/2011 1724   MONOABS 1.0 10/06/2011 1724   EOSABS 0.5 10/06/2011 1724   BASOSABS 0.0 10/06/2011 1724    Basic Metabolic Panel:    Component Value Date/Time   NA 132* 10/08/2011 0936   K 4.8 10/08/2011 0936   CL 93* 10/08/2011 0936   CO2 28 10/08/2011 0936   BUN 19 10/08/2011 0936   CREATININE 0.85 10/08/2011 0936   GLUCOSE 170* 10/08/2011 0936   CALCIUM 10.6* 10/08/2011 0936   CPK total 609, CK- MB 16.4 , SERIAL TROPONINS X 3 - NEGATIVE  EKG: NSR at 77, no ST T changes  CXR: no acute infiltrate  Hospital Course:   acute COPD exacerbation Patient admitted to medical floor and given scheduled albuterol and Atrovent nebulizer Also start her on IV Solu-Medrol The symptoms have been improving and switched  to oral prednisone from tomorrow if improved She informs on resuming to smoke since his recently and  has been counseled to stop smoking Patient on theophylline at home level with sub  therapeutic   symptoms improved and can be discharged on po prednisone 40 gm daily for 5 days along with her home inhalers. She can resume her home dose of prednisone once course completed. Patient noted for elevated blood glucose while in hospital due to being on high dose steroid and was covered with night time lantus and sliding scale insulin . Her home medications were continued.    GERD with hx of chronic dysphagia secondary to chr ulcerative esophagitis  Patient complained of some chest discomfort on admission which has resolved and is likely due to her underlying GERD. She has chr ulcerative esophagitis and recurrent candida esophagitis which could explain her  chronic dysphagia symptoms. She follows up with Dr Jarold Motto as outpt. - resumed her Nexium and also ordered  Maalox Patient also complains off pain while swallowing and also gives history of oral candidiasis. I have ordered nystatin swish and swallow and symptoms now improving    palpitations with elevated CPK and CK-MB Patient c/o palpitations on admission, TSH noted to be low with normal free T4. She sees Dr [name] , her endocrinologist for ? Thyroid symptoms.  -she denied any chest pain symptoms. She was stable on telemetry. EKG done and was normal. Serial troponins negative, while elevated CPK and CK-MB noted. Given no chest pain symptoms, normal EKG and negative troponins, i would not pursue further w/up for elevated CK-MB with some elevated total cpk alone. i discussed this with cardiology on call over the phone and agreed with the same.   Patient clinically stable and can be discharged home with outpt follow up.          Time spent on Discharge: 45 minutes   Signed: Breven Guidroz 10/08/2011, 11:13 AM

## 2011-10-14 ENCOUNTER — Encounter: Payer: Self-pay | Admitting: *Deleted

## 2011-10-14 DIAGNOSIS — B3781 Candidal esophagitis: Secondary | ICD-10-CM | POA: Insufficient documentation

## 2011-10-17 ENCOUNTER — Ambulatory Visit (INDEPENDENT_AMBULATORY_CARE_PROVIDER_SITE_OTHER): Payer: Medicare Other | Admitting: Gastroenterology

## 2011-10-17 ENCOUNTER — Encounter: Payer: Self-pay | Admitting: Gastroenterology

## 2011-10-17 DIAGNOSIS — R160 Hepatomegaly, not elsewhere classified: Secondary | ICD-10-CM

## 2011-10-17 DIAGNOSIS — R141 Gas pain: Secondary | ICD-10-CM

## 2011-10-17 DIAGNOSIS — E119 Type 2 diabetes mellitus without complications: Secondary | ICD-10-CM

## 2011-10-17 DIAGNOSIS — M459 Ankylosing spondylitis of unspecified sites in spine: Secondary | ICD-10-CM

## 2011-10-17 DIAGNOSIS — R109 Unspecified abdominal pain: Secondary | ICD-10-CM | POA: Insufficient documentation

## 2011-10-17 DIAGNOSIS — D899 Disorder involving the immune mechanism, unspecified: Secondary | ICD-10-CM

## 2011-10-17 MED ORDER — ALIGN PO CAPS: 1.0000 | ORAL_CAPSULE | Freq: Every day | ORAL | Status: AC

## 2011-10-17 NOTE — Progress Notes (Signed)
This is a very pleasant and very intelligent and 70 year old [place] American female with multiple, multiple medical problems. 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 She been on Enbrel  Humira every other week for several years, but has had recurrent Candida infections of her throat and esophagus. She also has severe COPD, and is on prednisone 40 mg every other day. She recently was hospitalized because of shortness of breath, abdominal swelling, and apparently had recurrent Candida esophagitis and was treated with nystatin. Patient also is on chronic Nexium 40 mg a day, oral medications for diabetes, and several inhalers for her severe COPD and chronic bronchitis. Patient also has chronic anemia with normal iron, B12, and folate levels. Her main complaint now is swelling of her abdomen with excessive flatus and also belching and burping despite PPI therapy. I reviewed her records in detail, and she has not had CT scan of her abdomen, and there is no history of hepatitis, pancreatitis, or gallbladder disease. She denies anorexia or weight loss or any specific food intolerances. Patient also denies abuse of alcohol, cigarettes, or NSAIDs. There is a history of intermittent bright red blood per rectum, and she apparently has a history of hemorrhoids with negative colonoscopy several years ago. There is a history of chronic thyroid dysfunction.  Current Medications, Allergies, Past Medical History, Past Surgical History, Family History and Social History were reviewed in [employer] record.  Pertinent Review of Systems Negative... severe shortness of breath with exertion but no dyspnea. Patient denies dysphagia, melena or hematochezia. Her rheumatologist has held her Humira for the last month. She does have hypertension but no history of MIs or CVAs.   Physical Exam: Obese patient in no acute distress. I cannot appreciate stigmata  of chronic liver disease or thyromegaly. There are scattered wheezes in both lung fields but no rhonchi. She appear to be in a regular rhythm without significant murmurs gallops or rubs. Her abdomen is distended and she has an enlarged liver in the right upper quadrant several fingerbreadths below the right costal margin. There is no splenomegaly, or evidence of definite ascites, other abdominal masses or tenderness. Bowel sounds are normal. Blood pressure today is 132/68 and pulse is 76 and regular.    Assessment and Plan: Hepatomegaly probably related to pulmonary hypertension and chronic passive congestion of her liver, rule out cirrhosis and portal hypertension. Review of her liver function tests showed no abnormalities however. I have scheduled her for CT scan of the abdomen and pelvis, and review of her labs shows normal BUN and creatinine. Otherwise we will continue her medications as listed and reviewed, and I have added probiotic therapy to her regime. She seems to be a poor candidate for endoscopy or colonoscopy because of her severe COPD. Apparently off Enbrel he has severe psoriasis and arthropathy. Also, the patient is on frequent doses of prednisone which lowered her immune resistance. Her primary care physician is Dr. Corine Shelter. CT scan hopefully will also determine if she has any ascites or other structural hepatic or pancreatic lesions. She is to see me back in 2-3 weeks for followup. Also we will check repeat TSH, free T3 and total T4 levels. Encounter Diagnoses  Name Primary?  . Hepatomegaly   . Abdominal  pain, other specified site

## 2011-10-17 NOTE — Patient Instructions (Addendum)
You have been given a separate informational sheet regarding your tobacco use, the importance of quitting and local resources to help you quit. We have given you samples of Align. This puts good bacteria back into your intestines. You should take 1 capsule by mouth once daily. If this works well for you, it can be purchased over the counter. You have been scheduled for a CT scan of the abdomen and pelvis at [facility] ([address]---this is in the same building as [facility]).   You are scheduled on 10/18/11 at 9:00. You should arrive 15 minutes prior to your appointment time for registration. Please follow the written instructions below on the day of your exam:  WARNING: IF YOU ARE ALLERGIC TO IODINE/X-RAY DYE, PLEASE NOTIFY RADIOLOGY IMMEDIATELY AT [phone]! YOU WILL BE GIVEN A 13 HOUR PREMEDICATION PREP.  1) Do not eat or drink anything after 5:00 am (4 hours prior to your test) 2) You have been given 2 bottles of oral contrast to drink. The solution may taste better if refrigerated, but do NOT add ice or any other liquid to this solution. Shake well before drinking.    Drink 1 bottle of contrast @ 7:00 am (2 hours prior to your exam)  Drink 1 bottle of contrast @ 8:00 am (1 hour prior to your exam)  You may take any medications as prescribed with a small amount of water except for the following: Metformin, Glucophage, Glucovance, Avandamet, Riomet, Fortamet, Actoplus Met, Janumet, Glumetza or Metaglip. The above medications must be held the day of the exam AND 48 hours after the exam.  The purpose of you drinking the oral contrast is to aid in the visualization of your intestinal tract. The contrast solution may cause some diarrhea. Before your exam is started, you will be given a small amount of fluid to drink. Depending on your individual set of symptoms, you may also receive an intravenous injection of x-ray contrast/dye. Plan on being at [facility] for 30  minutes or long, depending on the type of exam you are having performed.  If you have any questions regarding your exam or if you need to reschedule, you may call the CT department at [phone] between the hours of 8:00 am and 5:00 pm, Monday-Friday.  ________________________________________________________________________ Please follow up with Dr Jarold Motto in 2 weeks. CC: Dr Petra Kuba

## 2011-10-18 ENCOUNTER — Other Ambulatory Visit: Payer: Medicare Other

## 2011-10-25 ENCOUNTER — Telehealth: Payer: Self-pay | Admitting: *Deleted

## 2011-10-25 ENCOUNTER — Ambulatory Visit (INDEPENDENT_AMBULATORY_CARE_PROVIDER_SITE_OTHER)
Admission: RE | Admit: 2011-10-25 | Discharge: 2011-10-25 | Disposition: A | Discharge status: Home / Self Care | Payer: Medicare Other | Source: Ambulatory Visit | Attending: Gastroenterology | Admitting: Gastroenterology

## 2011-10-25 DIAGNOSIS — R160 Hepatomegaly, not elsewhere classified: Secondary | ICD-10-CM

## 2011-10-25 DIAGNOSIS — K7460 Unspecified cirrhosis of liver: Secondary | ICD-10-CM

## 2011-10-25 DIAGNOSIS — R109 Unspecified abdominal pain: Secondary | ICD-10-CM

## 2011-10-25 MED ORDER — IOHEXOL 300 MG/ML  SOLN
100.0000 mL | Freq: Once | INTRAMUSCULAR | Status: AC | PRN
  Administered 2011-10-25: 100 mL via INTRAVENOUS

## 2011-10-25 NOTE — Telephone Encounter (Signed)
Spoke with pt of inform her of Dr Norval Gable findings. She will try to come in tomorrow for her labs. Sent CT scan report to Dr Corine Shelter; mailed pt an appt reminder for 11/01/11 @ 10:30am. Pt stated understanding.

## 2011-10-25 NOTE — Telephone Encounter (Signed)
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 Please check PT, hepatitis B surface antigen, hepatitis C. antibody, anemia profile, ammonia level, alpha-1 antitrypsin level, AMA and ANA. Also copy CT scan report to her primary care physician Dr. Corine Shelter.

## 2011-10-27 ENCOUNTER — Ambulatory Visit: Payer: Medicare Other

## 2011-10-27 DIAGNOSIS — K7460 Unspecified cirrhosis of liver: Secondary | ICD-10-CM

## 2011-10-27 LAB — VITAMIN B12: Vitamin B-12: >1500 pg/mL — ABNORMAL HIGH (ref 211–911)

## 2011-10-27 LAB — PROTIME-INR
INR: 1 ratio (ref 0.8–1.0)
Prothrombin Time: 11 s (ref 10.2–12.4)

## 2011-10-27 LAB — FOLATE: Folate: 14.5 ng/mL (ref >5.9)

## 2011-10-27 LAB — AMMONIA: Ammonia: 31 umol/L (ref 16–53)

## 2011-10-28 LAB — MITOCHONDRIAL ANTIBODIES: Mitochondrial M2 Ab, IgG: 0.08 (ref <0.91)

## 2011-10-28 LAB — HEPATITIS B SURFACE ANTIGEN: Hepatitis B Surface Ag: NEGATIVE

## 2011-10-28 LAB — ANA: Anti Nuclear Antibody(ANA): NEGATIVE

## 2011-10-31 ENCOUNTER — Telehealth: Payer: Self-pay | Admitting: *Deleted

## 2011-10-31 MED ORDER — FERROUS FUM-IRON POLYSACCH 162-115.2 MG PO CAPS: 1.0000 | ORAL_CAPSULE | Freq: Every day | ORAL | Status: DC

## 2011-10-31 NOTE — Telephone Encounter (Signed)
Message copied by Leonette Monarch on Mon Oct 31, 2011  9:08 AM ------      Message from: Jarold Motto, DAVID R      Created: Fri Oct 28, 2011  8:44 AM       She needs CBC and start daily tandem.

## 2011-10-31 NOTE — Telephone Encounter (Signed)
Pt aware and she will get cbc after she see Dr Jarold Motto bc she would like to have some other labs done for her thyroid.

## 2011-11-01 ENCOUNTER — Encounter: Payer: Self-pay | Admitting: Gastroenterology

## 2011-11-01 ENCOUNTER — Ambulatory Visit (INDEPENDENT_AMBULATORY_CARE_PROVIDER_SITE_OTHER): Payer: Medicare Other | Admitting: Gastroenterology

## 2011-11-01 ENCOUNTER — Other Ambulatory Visit (INDEPENDENT_AMBULATORY_CARE_PROVIDER_SITE_OTHER): Payer: Medicare Other

## 2011-11-01 VITALS — BP 126/58 | HR 72 | Ht 64.0 in | Wt 186.0 lb

## 2011-11-01 DIAGNOSIS — D649 Anemia, unspecified: Secondary | ICD-10-CM

## 2011-11-01 DIAGNOSIS — K7460 Unspecified cirrhosis of liver: Secondary | ICD-10-CM

## 2011-11-01 DIAGNOSIS — D3500 Benign neoplasm of unspecified adrenal gland: Secondary | ICD-10-CM

## 2011-11-01 DIAGNOSIS — K761 Chronic passive congestion of liver: Secondary | ICD-10-CM

## 2011-11-01 DIAGNOSIS — E039 Hypothyroidism, unspecified: Secondary | ICD-10-CM

## 2011-11-01 DIAGNOSIS — K219 Gastro-esophageal reflux disease without esophagitis: Secondary | ICD-10-CM

## 2011-11-01 DIAGNOSIS — D62 Acute posthemorrhagic anemia: Secondary | ICD-10-CM | POA: Insufficient documentation

## 2011-11-01 DIAGNOSIS — K589 Irritable bowel syndrome without diarrhea: Secondary | ICD-10-CM

## 2011-11-01 DIAGNOSIS — J449 Chronic obstructive pulmonary disease, unspecified: Secondary | ICD-10-CM

## 2011-11-01 LAB — CBC WITH DIFFERENTIAL/PLATELET
Basophils Relative: 0 % (ref 0.0–3.0)
Eosinophils Relative: 4.3 % (ref 0.0–5.0)
HCT: 30.9 % — ABNORMAL LOW (ref 36.0–46.0)
Hemoglobin: 9.8 g/dL — ABNORMAL LOW (ref 12.0–15.0)
Lymphs Abs: 2.1 10*3/uL (ref 0.7–4.0)
MCV: 83.9 fl (ref 78.0–100.0)
Monocytes Absolute: 0.9 10*3/uL (ref 0.1–1.0)
Monocytes Relative: 10.1 % (ref 3.0–12.0)
Neutro Abs: 5.4 10*3/uL (ref 1.4–7.7)
RBC: 3.68 Mil/uL — ABNORMAL LOW (ref 3.87–5.11)
WBC: 8.8 10*3/uL (ref 4.5–10.5)

## 2011-11-01 NOTE — Progress Notes (Signed)
History of Present Illness: This is a very, very complex 70 year old patient with severe rheumatoid arthritis, severe COPD and chronic pulmonary hypertension, chronic GERD, adult onset diabetes, and continued symptoms of abdominal gas and bloating. Recent labs were unremarkable except for severe iron deficiency. I cannot find the hemoglobin and hematocrit that has been ordered today. CT scan of the abdomen showed a cirrhotic right lobe of the liver without evidence of portal hypertension or ascites. Metabolic causes of liver disease otherwise were negative. There is no family history of liver disease, the patient does not use alcohol or have any history of hepatitis or pancreatitis. She also has no symptoms of hepatic insufficiency, mental status problems, or anasarca. Because of her severe COPD she has limited exercise tolerance and apparently is on home oxygen. Patient also has persistently low T 3 and T4 and TSH level suggesting possible pituitary dysfunction. Recent CT scan of the abdomen did show a right adrenal adenoma. She saw Dr. Everardo All endocrinology in February, and I've suggested followup endocrine evaluation. On PPI therapy the patient denies reflux or dysphagia.   Current Medications, Allergies, Past Medical History, Past Surgical History, Family History and Social History were reviewed in [employer] record.   Assessment and plan: CBC ordered today, and we will continue daily tandem iron replacement. I think she has chronic passive congestion of her liver with cirrhosis of a right hepatic lobe, but normal mentation, ammonia level, PT, alpha-fetoprotein level, and no evidence of portal hypertension or ascites. She is followed by rheumatology and is on immunosuppressant therapy and prednisone for her rheumatoid arthritis. Continue other medications as listed and reviewed in her record. I see no need for further GI evaluation at this time.  Please copy her primary care  physician, referring physician, and pertinent subspecialists.,, Dr.Ellison and DR Bary Leriche rheumatology and Dr. Corine Shelter. Encounter Diagnosis  Name Primary?  Marland Kitchen Anemia Yes

## 2011-11-01 NOTE — Patient Instructions (Signed)
Please go to the basement today for your labs.  CC: Dr Everardo All

## 2012-01-10 ENCOUNTER — Telehealth: Payer: Self-pay | Admitting: Gastroenterology

## 2012-01-10 MED ORDER — ESOMEPRAZOLE MAGNESIUM 40 MG PO CPDR: 40.0000 mg | DELAYED_RELEASE_CAPSULE | Freq: Every day | ORAL | Status: DC

## 2012-01-10 NOTE — Telephone Encounter (Signed)
Pt states that her Humira is managed by her dermatologist at [facility] and she will call them about restarting them. Pt also wanted to have a refill of her Nexium. I have sent all labs and office note to Dr Virgil Benedict. And made an appt for 01/24/2012 with Dr Jarold Motto

## 2012-01-24 ENCOUNTER — Ambulatory Visit: Payer: Medicare Other | Admitting: Gastroenterology

## 2012-02-01 ENCOUNTER — Ambulatory Visit: Payer: Medicare Other | Admitting: Endocrinology

## 2012-02-21 ENCOUNTER — Ambulatory Visit (INDEPENDENT_AMBULATORY_CARE_PROVIDER_SITE_OTHER): Payer: Medicare Other | Admitting: Endocrinology

## 2012-02-21 ENCOUNTER — Encounter: Payer: Self-pay | Admitting: Endocrinology

## 2012-02-21 ENCOUNTER — Other Ambulatory Visit (INDEPENDENT_AMBULATORY_CARE_PROVIDER_SITE_OTHER): Payer: Medicare Other

## 2012-02-21 VITALS — BP 118/62 | HR 92 | Temp 98.4°F | Ht 64.0 in | Wt 179.2 lb

## 2012-02-21 DIAGNOSIS — E079 Disorder of thyroid, unspecified: Secondary | ICD-10-CM

## 2012-02-21 LAB — TSH: TSH: 1.02 u[IU]/mL (ref 0.35–5.50)

## 2012-02-21 NOTE — Patient Instructions (Addendum)
blood tests are being requested for you today.  You will receive a letter with results. If your thyroid is overactive again, let's check a thyroid "scan" (a special, but easy and painless type of thyroid x ray).  you will receive a phone call, about a day and time for an appointmentIt works like this: you go to the x-ray department of the hospital to swallow a pill, which contains a miniscule amount of radiation.  You will not notice any symptoms from this.  You will go back to the x-ray department the next day, to lie down in front of a camera.   If your thyroid is normal, please return here in 3 months.   (see letter)

## 2012-02-21 NOTE — Progress Notes (Signed)
Subjective:    Patient ID: [name], female    DOB: 08/18/1941, 71 y.o.   MRN: [id number]  HPI Pt was last seen here in 2010 for euthyroid hyperthyroxinemia.  She now has few mos of moderate palpitations in the chest, and assoc diaphoresis.   Past Medical History  Diagnosis Date  . COPD (chronic obstructive pulmonary disease)   . Hypertension   . Asthma   . Psoriatic arthritis   . Psoriasis   . GERD (gastroesophageal reflux disease)   . Hiatal hernia   . Chronic sinus infection   . Heart murmur   . Bronchitis   . Pneumonia 10/07/11    "twice; last time was 2006"  . Diabetes mellitus     "pills"  . Anemia   . Blood transfusion ~ 1962    "11 units of blood"  . Headache     "sinus"  . Anxiety 10/07/11    "not tx'd for anxiety"  . Ankylosing spondylitis   . Arthritis     "psorasic"  . OA (osteoarthritis)   . Chronic back pain   . Dysrhythmia     "palpitations"  . Shortness of breath     "w/exertion"  . Esophageal dilatation   . Candida esophagitis     Past Surgical History  Procedure Date  . Nasal sinus surgery 09/29/2009  . Carpal tunnel release 2011    left hand  . Lipoma excision 2010    "left occipital area"  . Cataract extraction w/ intraocular lens implant 2009/2010    right/left  . Eye surgery   . Abdominal hysterectomy 1976  . Oophorectomy     'not sure which one"  . Appendectomy     History   Social History  . Marital Status: Widowed    Spouse Name: N/A    Number of Children: N/A  . Years of Education: N/A   Occupational History  . Not on file.   Social History Main Topics  . Smoking status: Current Some Day Smoker -- 55 years    Types: Cigarettes  . Smokeless tobacco: Never Used   Comment: "smoking 1-3 cig/day".. Counseling sheet given in exam room   . Alcohol Use: No     "quit social drinking years ago"  . Drug Use: No  . Sexually Active: Not on file   Other Topics Concern  . Not on file   Social History Narrative  . No  narrative on file    Current Outpatient Prescriptions on File Prior to Visit  Medication Sig Dispense Refill  . albuterol (PROVENTIL HFA;VENTOLIN HFA) 108 (90 BASE) MCG/ACT inhaler Inhale 2 puffs into the lungs every 6 (six) hours as needed. FOR SHORTNESS OF BREATH       . aspirin 81 MG chewable tablet Chew 81 mg by mouth daily.        . bifidobacterium infantis (ALIGN) capsule Take 1 capsule by mouth daily.  1 capsule  0  . brinzolamide (AZOPT) 1 % ophthalmic suspension Place 1 drop into the right eye 3 (three) times daily.        Marland Kitchen esomeprazole (NEXIUM) 40 MG capsule Take 1 capsule (40 mg total) by mouth daily.  90 capsule  3  . ferrous fumarate-iron polysaccharide complex (TANDEM) 162-115.2 MG CAPS Take 1 capsule by mouth daily with breakfast.  30 capsule  6  . fluticasone (FLONASE) 50 MCG/ACT nasal spray Place 2 sprays into the nose daily as needed. FOR ALLERGIES       .  Fluticasone-Salmeterol (ADVAIR) 250-50 MCG/DOSE AEPB Inhale 1 puff into the lungs every 12 (twelve) hours.        Marland Kitchen glyBURIDE-metformin (GLUCOVANCE) 2.5-500 MG per tablet Take 1 tablet by mouth daily with breakfast.        . olmesartan-hydrochlorothiazide (BENICAR HCT) 40-12.5 MG per tablet Take 1 tablet by mouth daily.        . predniSONE (DELTASONE) 5 MG tablet Take 5 mg by mouth daily. On Monday, Wednesday, Friday       . ranitidine (ZANTAC) 150 MG capsule Take 150 mg by mouth at bedtime.        . Simethicone (MYLANTA GAS PO) Take by mouth as needed.        . theophylline (THEODUR) 200 MG 12 hr tablet Take 200 mg by mouth 2 (two) times daily.        . Travoprost, BAK Free, (TRAVATAMN) 0.004 % SOLN ophthalmic solution 1 drop at bedtime.          Allergies  Allergen Reactions  . Penicillins Cross Reactors Anaphylaxis  . Lotrel Other (See Comments)    Nervous/shakiness  . Raptiva (Efalizumab) Rash    Family History  Problem Relation Age of Onset  . Lupus Sister   . Lung cancer Sister   . Thyroid disease Sister     . Colon cancer Neg Hx     BP 118/62  Pulse 92  Temp(Src) 98.4 F (36.9 C) (Oral)  Ht 5\' 4"  (1.626 m)  Wt 179 lb 3.2 oz (81.285 kg)  BMI 30.76 kg/m2  SpO2 91%  Review of Systems She is uncertain if she has fever    Objective:   Physical Exam VITAL SIGNS:  See vs page GENERAL: no distress NECK: There is no palpable thyroid enlargement.  No thyroid nodule is palpable.  No palpable lymphadenopathy at the anterior neck.   Lab Results  Component Value Date   TSH 1.02 02/21/2012      Assessment & Plan:  H/o euthyroid hyperthyroxinemia, with h/o abnormal tsh, now resolved.

## 2012-02-22 ENCOUNTER — Telehealth: Payer: Self-pay | Admitting: *Deleted

## 2012-02-22 NOTE — Telephone Encounter (Signed)
Called to inform pt of thyroid results. Pt informed (Letter also mailed to pt).

## 2012-03-08 ENCOUNTER — Encounter: Payer: Self-pay | Admitting: *Deleted

## 2012-03-15 ENCOUNTER — Ambulatory Visit (INDEPENDENT_AMBULATORY_CARE_PROVIDER_SITE_OTHER): Payer: Medicare Other | Admitting: Gastroenterology

## 2012-03-15 ENCOUNTER — Encounter: Payer: Self-pay | Admitting: Gastroenterology

## 2012-03-15 ENCOUNTER — Other Ambulatory Visit (INDEPENDENT_AMBULATORY_CARE_PROVIDER_SITE_OTHER): Payer: Medicare Other

## 2012-03-15 DIAGNOSIS — R197 Diarrhea, unspecified: Secondary | ICD-10-CM

## 2012-03-15 DIAGNOSIS — D509 Iron deficiency anemia, unspecified: Secondary | ICD-10-CM

## 2012-03-15 DIAGNOSIS — D539 Nutritional anemia, unspecified: Secondary | ICD-10-CM

## 2012-03-15 DIAGNOSIS — D649 Anemia, unspecified: Secondary | ICD-10-CM

## 2012-03-15 DIAGNOSIS — R109 Unspecified abdominal pain: Secondary | ICD-10-CM

## 2012-03-15 LAB — CBC WITH DIFFERENTIAL/PLATELET
Basophils Absolute: 0.1 10*3/uL (ref 0.0–0.1)
Eosinophils Relative: 3.1 % (ref 0.0–5.0)
Lymphocytes Relative: 26.1 % (ref 12.0–46.0)
Monocytes Relative: 11.4 % (ref 3.0–12.0)
Neutrophils Relative %: 58.7 % (ref 43.0–77.0)
Platelets: 150 10*3/uL (ref 150.0–400.0)
RDW: 17 % — ABNORMAL HIGH (ref 11.5–14.6)
WBC: 7.2 10*3/uL (ref 4.5–10.5)

## 2012-03-15 LAB — IBC PANEL
Iron: 60 ug/dL (ref 42–145)
Transferrin: 232.1 mg/dL (ref 212.0–360.0)

## 2012-03-15 LAB — HEPATIC FUNCTION PANEL
AST: 22 U/L (ref 0–37)
Albumin: 3.7 g/dL (ref 3.5–5.2)
Alkaline Phosphatase: 71 U/L (ref 39–117)
Total Protein: 7.1 g/dL (ref 6.0–8.3)

## 2012-03-15 LAB — LIPASE: Lipase: 31 U/L (ref 11.0–59.0)

## 2012-03-15 LAB — SEDIMENTATION RATE: Sed Rate: 15 mm/hr (ref 0–22)

## 2012-03-15 LAB — BASIC METABOLIC PANEL
BUN: 15 mg/dL (ref 6–23)
Chloride: 101 mEq/L (ref 96–112)
Creatinine, Ser: 0.9 mg/dL (ref 0.4–1.2)
GFR: 75.28 mL/min (ref >60.00)
Glucose, Bld: 153 mg/dL — ABNORMAL HIGH (ref 70–99)
Potassium: 4.3 mEq/L (ref 3.5–5.1)

## 2012-03-15 LAB — AMYLASE: Amylase: 93 U/L (ref 27–131)

## 2012-03-15 NOTE — Patient Instructions (Addendum)
You have been given a separate informational sheet regarding your tobacco use, the importance of quitting and local resources to help you quit. Your abdominal ultrasound is scheduled for 03/19/2012 arrive at [facility] radiology at 8:45am and make sure you have nothing to eat or drink after midnight.  Please go to the basement today for your labs.

## 2012-03-15 NOTE — Progress Notes (Signed)
This is a very complex 71 year old [place] American female with multiple medical problems. Her interview and exam today was very time consuming and exhausting because of the multitude of her medications and compliance. She has chronic psoriasis with associated rheumatic complications including polyarticular arthritis, severe COPD, diabetes, and a chronic abdominal pain syndrome. She in the past and been on Humira for several years with good control of her rheumatic problems, but has recently had recurrent Candida infections with difficult discontinuation of these medications. She is followed closely by rheumatology and is on Deltazone  5 mg 3 times a week. Apparently she takes Aleve on a when necessary basis. She does have chronic acid reflux and is on Nexium 40 mg a day with when necessary Mylanta, and Zantac 150 mg at bedtime. She denies acid reflux symptoms or dysphagia. Her main complaint is mid abdominal pain made worse by bending and lifting without nausea and vomiting, or significant change in her bowel habits, melena or hematochezia. She has had mild hepatomegaly, and recent CT scan has suggested possible mild cirrhosis. However, labs showed no evidence of autoimmune hepatitis, chronic viral hepatitis, or other metabolic causes of chronic liver disease. On reviewing her records I cannot see where she has had previous hepatitis or pancreatitis. The patient does not abuse alcohol or cigarettes. In spite of all these complaints, she has had no significant anorexia or weight loss.  Current Medications, Allergies, Past Medical History, Past Surgical History, Family History and Social History were reviewed in [employer] record.  Allergies  Allergen Reactions  . Penicillins Cross Reactors Anaphylaxis  . Lotrel Other (See Comments)    Nervous/shakiness  . Raptiva (Efalizumab) Rash   Outpatient Prescriptions Prior to Visit  Medication Sig Dispense Refill  . albuterol (PROVENTIL  HFA;VENTOLIN HFA) 108 (90 BASE) MCG/ACT inhaler Inhale 2 puffs into the lungs every 6 (six) hours as needed. FOR SHORTNESS OF BREATH       . aspirin 81 MG chewable tablet Chew 81 mg by mouth daily.        . bifidobacterium infantis (ALIGN) capsule Take 1 capsule by mouth daily.  1 capsule  0  . brinzolamide (AZOPT) 1 % ophthalmic suspension Place 1 drop into the right eye 3 (three) times daily.        Marland Kitchen esomeprazole (NEXIUM) 40 MG capsule Take 1 capsule (40 mg total) by mouth daily.  90 capsule  3  . ferrous fumarate-iron polysaccharide complex (TANDEM) 162-115.2 MG CAPS Take 1 capsule by mouth daily with breakfast.  30 capsule  6  . fluticasone (FLONASE) 50 MCG/ACT nasal spray Place 2 sprays into the nose daily as needed. FOR ALLERGIES       . Fluticasone-Salmeterol (ADVAIR) 250-50 MCG/DOSE AEPB Inhale 1 puff into the lungs every 12 (twelve) hours.        Marland Kitchen olmesartan-hydrochlorothiazide (BENICAR HCT) 40-12.5 MG per tablet Take 1 tablet by mouth daily.        . predniSONE (DELTASONE) 5 MG tablet Take 5 mg by mouth daily. On Monday, Wednesday, Friday       . ranitidine (ZANTAC) 150 MG capsule Take 150 mg by mouth at bedtime.        . Simethicone (MYLANTA GAS PO) Take by mouth as needed.        . theophylline (THEODUR) 200 MG 12 hr tablet Take 200 mg by mouth 2 (two) times daily.        . Travoprost, BAK Free, (TRAVATAMN) 0.004 % SOLN ophthalmic solution  1 drop at bedtime.        Marland Kitchen glyBURIDE-metformin (GLUCOVANCE) 2.5-500 MG per tablet Take 1 tablet by mouth daily with breakfast.         Past Medical History  Diagnosis Date  . COPD (chronic obstructive pulmonary disease)   . Hypertension   . Asthma   . Psoriatic arthritis   . Psoriasis   . GERD (gastroesophageal reflux disease)   . Hiatal hernia   . Chronic sinus infection   . Heart murmur   . Bronchitis   . Pneumonia 10/07/11    "twice; last time was 2006"  . Diabetes mellitus     "pills"  . Anemia   . Blood transfusion ~ 1962    "11  units of blood"  . Headache     "sinus"  . Anxiety 10/07/11    "not tx'd for anxiety"  . Ankylosing spondylitis   . Arthritis     "psorasic"  . OA (osteoarthritis)   . Chronic back pain   . Dysrhythmia     "palpitations"  . Shortness of breath     "w/exertion"  . Esophageal dilatation   . Candida esophagitis   . Cirrhosis of liver without mention of alcohol    Past Surgical History  Procedure Date  . Nasal sinus surgery 09/29/2009  . Carpal tunnel release 2011    left hand  . Lipoma excision 2010    "left occipital area"  . Cataract extraction w/ intraocular lens implant 2009/2010    right/left  . Eye surgery   . Abdominal hysterectomy 1976  . Oophorectomy     'not sure which one"  . Appendectomy    History   Social History  . Marital Status: Widowed    Spouse Name: N/A    Number of Children: 0  . Years of Education: N/A   Occupational History  . Retired    Social History Main Topics  . Smoking status: Current Some Day Smoker -- 55 years    Types: Cigarettes  . Smokeless tobacco: Never Used   Comment: "smoking 1-3 cig/day".. Counseling sheet given in exam room   . Alcohol Use: No     "quit social drinking years ago"  . Drug Use: No  . Sexually Active: None   Other Topics Concern  . None   Social History Narrative  . None   Family History  Problem Relation Age of Onset  . Lupus Sister   . Lung cancer Sister   . Thyroid disease Sister   . Colon cancer Neg Hx         Physical Exam: Healthy-appearing patient in no acute distress. Blood pressure 108/60 and pulse 74 and regular. Weight 192 pounds and BMI of 30.07. I cannot appreciate stigmata of chronic liver disease. Her abdomen is obese without definite organomegaly, masses or tenderness. She does appear to have a diastases ventral hernia exacerbated by standing, but no evidence of significant umbilical herniation or incarceration. Bowel sounds are nonobstructed. Mental status is  normal.    Assessment and Plan: Multiple chronic GI issues of unexplained etiology and a very complex patient on multiple medications. Persistent and do not seem consistent with recurrent Candida esophagitis, examination of her oropharynx is unremarkable. I'll repeat her lab tests including amylase and lipase, stool IFOB for occult blood, stool elastase-1 exam, and check upper abdominal ultrasound exam to exclude cholelithiasis. Last colonoscopy exam was in 2007, and we may need to repeat endoscopy and colonoscopy depending on her workup  and clinical course. Encounter Diagnoses  Name Primary?  . Abdominal pain Yes  . Diarrhea

## 2012-03-19 ENCOUNTER — Ambulatory Visit (HOSPITAL_COMMUNITY)
Admission: RE | Admit: 2012-03-19 | Discharge: 2012-03-19 | Disposition: A | Discharge status: Home / Self Care | Payer: Medicare Other | Source: Ambulatory Visit | Attending: Gastroenterology | Admitting: Gastroenterology

## 2012-03-19 DIAGNOSIS — R197 Diarrhea, unspecified: Secondary | ICD-10-CM | POA: Insufficient documentation

## 2012-03-19 DIAGNOSIS — R109 Unspecified abdominal pain: Secondary | ICD-10-CM | POA: Insufficient documentation

## 2012-03-20 ENCOUNTER — Other Ambulatory Visit: Payer: Medicare Other

## 2012-03-20 DIAGNOSIS — R197 Diarrhea, unspecified: Secondary | ICD-10-CM

## 2012-03-21 LAB — FECAL FAT QUALITATIVE: Free Fatty Acids: NORMAL

## 2012-03-30 ENCOUNTER — Other Ambulatory Visit: Payer: Medicare Other

## 2012-04-17 ENCOUNTER — Telehealth: Payer: Self-pay | Admitting: Gastroenterology

## 2012-04-17 NOTE — Telephone Encounter (Signed)
Needs ov

## 2012-04-17 NOTE — Telephone Encounter (Signed)
Spoke with pt and she is aware. Pt scheduled to see Dr. Jarold Motto 04/19/12@10 :45am. Pt aware of appt date and time.

## 2012-04-17 NOTE — Telephone Encounter (Signed)
Pt called to get her stool test results. Discussed with pt her results. Pt states she is still having pain on her right side under her rib cage. Pt wants to know what can be done about the pain. Please advise.

## 2012-04-19 ENCOUNTER — Encounter: Payer: Self-pay | Admitting: Gastroenterology

## 2012-04-19 ENCOUNTER — Ambulatory Visit (INDEPENDENT_AMBULATORY_CARE_PROVIDER_SITE_OTHER): Payer: Medicare Other | Admitting: Gastroenterology

## 2012-04-19 DIAGNOSIS — E669 Obesity, unspecified: Secondary | ICD-10-CM

## 2012-04-19 DIAGNOSIS — R109 Unspecified abdominal pain: Secondary | ICD-10-CM

## 2012-04-19 DIAGNOSIS — R160 Hepatomegaly, not elsewhere classified: Secondary | ICD-10-CM

## 2012-04-19 DIAGNOSIS — M069 Rheumatoid arthritis, unspecified: Secondary | ICD-10-CM

## 2012-04-19 DIAGNOSIS — E119 Type 2 diabetes mellitus without complications: Secondary | ICD-10-CM

## 2012-04-19 DIAGNOSIS — K219 Gastro-esophageal reflux disease without esophagitis: Secondary | ICD-10-CM

## 2012-04-19 MED ORDER — ESOMEPRAZOLE MAGNESIUM 40 MG PO CPDR: 40.0000 mg | DELAYED_RELEASE_CAPSULE | Freq: Two times a day (BID) | ORAL | Status: DC

## 2012-04-19 NOTE — Progress Notes (Signed)
This is a 71 year old African American female with severe COPD and mild pulmonary hypertension, severe rheumatoid arthritis, and chronic acid reflux, gas, bloating, and persistent right upper quadrant pain. This pain has been almost daily for the last year and has no other real associations except with gas and bloating and reflux symptoms. She takes 40 mg of Nexium a day and continues with regurgitation reflux symptoms but no dysphagia. He has regular bowel movements without melena or hematochezia. She currently is on iron therapy and has had her hemoglobin rise from 9.7 to 13.4. She has had previous endoscopy and colonoscopy within the last 5 years. Recent stool cards for occult blood were negative. CT scan of the abdomen and upper abdominal ultrasound exam of both been unremarkable. She's had no anorexia, weight loss, melena, hematochezia, specific hepatobiliary or systemic complaints except for diffuse arthritis related to her rheumatoid disease.  Current Medications, Allergies, Past Medical History, Past Surgical History, Family History and Social History were reviewed in [employer] record.  Pertinent Review of Systems Negative... her pulmonary status has improved but she continues with dyspnea on exertion and home oxygen dependency. She is followed closely by pulmonary physicians.   Physical Exam: Healthy-appearing patient in no distress. Blood pressure 140/62, pulse 72 and regular, weight 177 pounds, and BMI 30.49. I cannot appreciate stigmata of chronic liver disease. Examination of the abdomen that showed mild hepatomegaly with a firm liver age several centimeters below the right costal margin tender to touch. I cannot appreciate a definite enlargement of the left lobe the liver, splenomegaly, or ventral herniation. There is no evidence of ascites, and bowel sounds are normal. Mental status is normal.    Assessment and Plan: As before, I think this patient has mild  hepatomegaly related to her pulmonary hypertension and chronic passive congestion of her liver. Her acid reflux is probably associated with an element of delayed gastric emptying associated with her diabetes. I have scheduled CCK-HIDA scan to be complete, had a long discussion with the patient about the probable benign nature of her pain, and we'll continue all medications as listed and reviewed, but of increased her Nexium to 40 mg twice a day for 2 weeks. She is a poor candidate for followup colonoscopy or endoscopy. She is otherwise to continue her medications as listed and reviewed. I also gave the patient today copy of her records, labs, and x-ray reports as requested. Encounter Diagnosis  Name Primary?  . Abdominal pain Yes

## 2012-04-19 NOTE — Patient Instructions (Addendum)
Today we have printed your most recent labs, ultrasound, and CT results for your records.  Samples of Nexium given to increase to twice a day.  You have been scheduled for a Hidda Scan on 04/26/2012 at [facility] please arrive at 9:45am for a 10:00am appt, have nothing to eat or drink after midnight.

## 2012-04-26 ENCOUNTER — Encounter (HOSPITAL_COMMUNITY)
Admission: RE | Admit: 2012-04-26 | Discharge: 2012-04-26 | Disposition: A | Discharge status: Home / Self Care | Payer: Medicare Other | Source: Ambulatory Visit | Attending: Gastroenterology | Admitting: Gastroenterology

## 2012-04-26 DIAGNOSIS — R110 Nausea: Secondary | ICD-10-CM | POA: Insufficient documentation

## 2012-04-26 DIAGNOSIS — R1011 Right upper quadrant pain: Secondary | ICD-10-CM | POA: Insufficient documentation

## 2012-04-26 MED ORDER — SINCALIDE 5 MCG IJ SOLR
0.0200 ug/kg | Freq: Once | INTRAMUSCULAR | Status: AC
  Administered 2012-04-26: 5 ug via INTRAVENOUS

## 2012-04-26 MED ORDER — TECHNETIUM TC 99M MEBROFENIN IV KIT
5.0000 | PACK | Freq: Once | INTRAVENOUS | Status: AC | PRN
  Administered 2012-04-26: 5 via INTRAVENOUS

## 2012-04-26 MED ORDER — SINCALIDE 5 MCG IJ SOLR
INTRAMUSCULAR | Status: AC
  Filled 2012-04-26: qty 5

## 2012-05-03 ENCOUNTER — Ambulatory Visit: Payer: Medicare Other | Admitting: Endocrinology

## 2012-05-24 ENCOUNTER — Ambulatory Visit (INDEPENDENT_AMBULATORY_CARE_PROVIDER_SITE_OTHER): Payer: Medicare Other | Admitting: Endocrinology

## 2012-05-24 ENCOUNTER — Encounter: Payer: Self-pay | Admitting: Endocrinology

## 2012-05-24 ENCOUNTER — Other Ambulatory Visit (INDEPENDENT_AMBULATORY_CARE_PROVIDER_SITE_OTHER): Payer: Medicare Other

## 2012-05-24 VITALS — BP 132/62 | HR 92 | Temp 98.7°F | Ht 64.0 in | Wt 179.0 lb

## 2012-05-24 DIAGNOSIS — E079 Disorder of thyroid, unspecified: Secondary | ICD-10-CM

## 2012-05-24 NOTE — Progress Notes (Signed)
Subjective:    Patient ID: [name], female    DOB: [birth year], 71 y.o.   MRN: [id number]  HPI Pt was last seen here in 2010 for euthyroid hyperthyroxinemia. She had an abnormal tsh 6 mos ago, but 2 more recent results have been normal.  pt states she feels well in general, except for heartburn.  Past Medical History  Diagnosis Date  . COPD (chronic obstructive pulmonary disease)   . Hypertension   . Asthma   . Psoriatic arthritis   . Psoriasis   . GERD (gastroesophageal reflux disease)   . Hiatal hernia   . Chronic sinus infection   . Heart murmur   . Bronchitis   . Pneumonia 10/07/11    "twice; last time was 2006"  . Diabetes mellitus     "pills"  . Anemia   . Blood transfusion ~ 1962    "11 units of blood"  . Headache     "sinus"  . Anxiety 10/07/11    "not tx'd for anxiety"  . Ankylosing spondylitis   . Arthritis     "psorasic"  . OA (osteoarthritis)   . Chronic back pain   . Dysrhythmia     "palpitations"  . Shortness of breath     "w/exertion"  . Esophageal dilatation   . Candida esophagitis   . Cirrhosis of liver without mention of alcohol     Past Surgical History  Procedure Date  . Nasal sinus surgery 09/29/2009  . Carpal tunnel release 2011    left hand  . Lipoma excision 2010    "left occipital area"  . Cataract extraction w/ intraocular lens implant 2009/2010    right/left  . Eye surgery   . Abdominal hysterectomy 1976  . Oophorectomy     'not sure which one"  . Appendectomy     History   Social History  . Marital Status: Widowed    Spouse Name: N/A    Number of Children: 0  . Years of Education: N/A   Occupational History  . Retired    Social History Main Topics  . Smoking status: Former Smoker -- 55 years    Types: Cigarettes  . Smokeless tobacco: Never Used   Comment: "smoking 1-3 cig/day".. Counseling sheet given in exam room   . Alcohol Use: No     "quit social drinking years ago"  . Drug Use: No  . Sexually Active:  Not on file   Other Topics Concern  . Not on file   Social History Narrative  . No narrative on file    Current Outpatient Prescriptions on File Prior to Visit  Medication Sig Dispense Refill  . Aclidinium Bromide (TUDORZA PRESSAIR) 400 MCG/ACT AEPB Inhale into the lungs 2 (two) times daily.      Marland Kitchen albuterol (PROVENTIL HFA;VENTOLIN HFA) 108 (90 BASE) MCG/ACT inhaler Inhale 2 puffs into the lungs every 6 (six) hours as needed. FOR SHORTNESS OF BREATH       . ammonium lactate (LAC-HYDRIN) 12 % lotion Apply 1 application topically as needed.      Marland Kitchen aspirin 81 MG chewable tablet Chew 81 mg by mouth daily.        . bifidobacterium infantis (ALIGN) capsule Take 1 capsule by mouth daily.  1 capsule  0  . esomeprazole (NEXIUM) 40 MG capsule Take 1 capsule (40 mg total) by mouth daily.  90 capsule  3  . ferrous fumarate-iron polysaccharide complex (TANDEM) 162-115.2 MG CAPS Take 1 capsule by mouth daily  with breakfast.  30 capsule  6  . fluticasone (FLONASE) 50 MCG/ACT nasal spray Place 2 sprays into the nose daily as needed. FOR ALLERGIES       . Fluticasone-Salmeterol (ADVAIR) 250-50 MCG/DOSE AEPB Inhale 1 puff into the lungs every 12 (twelve) hours.        . Multiple Vitamins-Minerals (EYE VITAMINS) CAPS Take 1 capsule by mouth daily.      Marland Kitchen olmesartan-hydrochlorothiazide (BENICAR HCT) 40-12.5 MG per tablet Take 1 tablet by mouth daily.        . predniSONE (DELTASONE) 5 MG tablet Take 5 mg by mouth daily. On Monday, Wednesday, Friday       . ranitidine (ZANTAC) 150 MG capsule Take 150 mg by mouth at bedtime.        . saxagliptin HCl (ONGLYZA) 5 MG TABS tablet Take 5 mg by mouth daily.      . Simethicone (MYLANTA GAS PO) Take by mouth as needed.        . theophylline (THEODUR) 200 MG 12 hr tablet Take 200 mg by mouth 2 (two) times daily.        . Travoprost, BAK Free, (TRAVATAMN) 0.004 % SOLN ophthalmic solution 1 drop at bedtime.          Allergies  Allergen Reactions  . Penicillins Cross  Reactors Anaphylaxis  . Amlodipine Besy-Benazepril Hcl Other (See Comments)    Nervous/shakiness  . Raptiva (Efalizumab) Rash    Family History  Problem Relation Age of Onset  . Lupus Sister   . Lung cancer Sister   . Thyroid disease Sister   . Colon cancer Neg Hx     BP 132/62  Pulse 92  Temp 98.7 F (37.1 C) (Oral)  Ht 5\' 4"  (1.626 m)  Wt 179 lb (81.194 kg)  BMI 30.73 kg/m2  SpO2 90%    Review of Systems Denies weight change    Objective:   Physical Exam VITAL SIGNS:  See vs page GENERAL: no distress NECK: There is no palpable thyroid enlargement, but the surface is irregular.  no lymphadenopathy at the anterior neck.  Lab Results  Component Value Date   TSH 1.08 05/24/2012      Assessment & Plan:  H/o euthyroid hyperthyroxinemia, now euthyroid

## 2012-05-24 NOTE — Patient Instructions (Addendum)
blood tests are being requested for you today.  You will receive a letter with results. If this blood test in normal, please come back in 8 months

## 2012-05-25 ENCOUNTER — Telehealth: Payer: Self-pay | Admitting: *Deleted

## 2012-05-25 NOTE — Telephone Encounter (Signed)
Called pt to inform of lab results, left message for pt to callback office (letter also mailed to pt). 

## 2012-05-25 NOTE — Telephone Encounter (Signed)
Pt informed

## 2012-09-18 ENCOUNTER — Other Ambulatory Visit (HOSPITAL_COMMUNITY): Payer: Self-pay | Admitting: Pulmonary Disease

## 2012-09-18 DIAGNOSIS — R1310 Dysphagia, unspecified: Secondary | ICD-10-CM

## 2012-09-18 DIAGNOSIS — K2270 Barrett's esophagus without dysplasia: Secondary | ICD-10-CM

## 2012-09-25 ENCOUNTER — Ambulatory Visit (HOSPITAL_COMMUNITY)
Admission: RE | Admit: 2012-09-25 | Discharge: 2012-09-25 | Disposition: A | Discharge status: Home / Self Care | Payer: Medicare Other | Source: Ambulatory Visit | Attending: Pulmonary Disease | Admitting: Pulmonary Disease

## 2012-09-25 ENCOUNTER — Other Ambulatory Visit (HOSPITAL_COMMUNITY): Payer: Self-pay | Admitting: Pulmonary Disease

## 2012-09-25 DIAGNOSIS — R1310 Dysphagia, unspecified: Secondary | ICD-10-CM

## 2012-09-25 DIAGNOSIS — K2270 Barrett's esophagus without dysplasia: Secondary | ICD-10-CM

## 2012-09-26 ENCOUNTER — Other Ambulatory Visit (HOSPITAL_COMMUNITY): Payer: Medicare Other

## 2012-11-16 ENCOUNTER — Other Ambulatory Visit (HOSPITAL_COMMUNITY): Payer: Self-pay | Admitting: Cardiovascular Disease

## 2012-11-16 DIAGNOSIS — I998 Other disorder of circulatory system: Secondary | ICD-10-CM

## 2012-11-16 DIAGNOSIS — R0989 Other specified symptoms and signs involving the circulatory and respiratory systems: Secondary | ICD-10-CM

## 2012-11-19 ENCOUNTER — Ambulatory Visit (HOSPITAL_COMMUNITY)
Admission: RE | Admit: 2012-11-19 | Discharge: 2012-11-19 | Disposition: A | Discharge status: Home / Self Care | Payer: Medicare Other | Source: Ambulatory Visit | Attending: Cardiovascular Disease | Admitting: Cardiovascular Disease

## 2012-11-19 DIAGNOSIS — R0989 Other specified symptoms and signs involving the circulatory and respiratory systems: Secondary | ICD-10-CM

## 2012-11-19 DIAGNOSIS — I998 Other disorder of circulatory system: Secondary | ICD-10-CM | POA: Insufficient documentation

## 2012-11-20 NOTE — Progress Notes (Signed)
Carotid Duplex completed. Mickie Kay

## 2012-11-20 NOTE — Progress Notes (Signed)
BLE Arterial Duplex Doppler completed. [name]

## 2012-11-28 DIAGNOSIS — Z9289 Personal history of other medical treatment: Secondary | ICD-10-CM

## 2012-11-28 HISTORY — DX: Personal history of other medical treatment: Z92.89

## 2012-11-30 ENCOUNTER — Other Ambulatory Visit (HOSPITAL_COMMUNITY): Payer: Self-pay | Admitting: Cardiovascular Disease

## 2012-11-30 DIAGNOSIS — I10 Essential (primary) hypertension: Secondary | ICD-10-CM

## 2012-11-30 DIAGNOSIS — I739 Peripheral vascular disease, unspecified: Secondary | ICD-10-CM

## 2012-11-30 DIAGNOSIS — I998 Other disorder of circulatory system: Secondary | ICD-10-CM

## 2012-12-03 ENCOUNTER — Ambulatory Visit
Admission: RE | Admit: 2012-12-03 | Discharge: 2012-12-03 | Disposition: A | Discharge status: Home / Self Care | Payer: Medicare Other | Source: Ambulatory Visit | Attending: Cardiovascular Disease | Admitting: Cardiovascular Disease

## 2012-12-03 ENCOUNTER — Other Ambulatory Visit: Payer: Self-pay | Admitting: Cardiovascular Disease

## 2012-12-03 DIAGNOSIS — R0602 Shortness of breath: Secondary | ICD-10-CM

## 2012-12-03 DIAGNOSIS — Z01818 Encounter for other preprocedural examination: Secondary | ICD-10-CM

## 2012-12-04 ENCOUNTER — Encounter (HOSPITAL_COMMUNITY): Payer: Self-pay | Admitting: Pharmacy Technician

## 2012-12-05 ENCOUNTER — Ambulatory Visit (HOSPITAL_COMMUNITY)
Admission: RE | Admit: 2012-12-05 | Discharge: 2012-12-05 | Disposition: A | Discharge status: Home / Self Care | Payer: Medicare Other | Source: Ambulatory Visit | Attending: Cardiovascular Disease | Admitting: Cardiovascular Disease

## 2012-12-05 DIAGNOSIS — F17200 Nicotine dependence, unspecified, uncomplicated: Secondary | ICD-10-CM | POA: Insufficient documentation

## 2012-12-05 DIAGNOSIS — I739 Peripheral vascular disease, unspecified: Secondary | ICD-10-CM | POA: Insufficient documentation

## 2012-12-05 DIAGNOSIS — I10 Essential (primary) hypertension: Secondary | ICD-10-CM | POA: Insufficient documentation

## 2012-12-05 DIAGNOSIS — E119 Type 2 diabetes mellitus without complications: Secondary | ICD-10-CM | POA: Insufficient documentation

## 2012-12-05 DIAGNOSIS — R079 Chest pain, unspecified: Secondary | ICD-10-CM | POA: Insufficient documentation

## 2012-12-05 DIAGNOSIS — R0602 Shortness of breath: Secondary | ICD-10-CM | POA: Insufficient documentation

## 2012-12-05 DIAGNOSIS — I998 Other disorder of circulatory system: Secondary | ICD-10-CM

## 2012-12-05 DIAGNOSIS — R42 Dizziness and giddiness: Secondary | ICD-10-CM | POA: Insufficient documentation

## 2012-12-05 MED ORDER — REGADENOSON 0.4 MG/5ML IV SOLN
0.4000 mg | Freq: Once | INTRAVENOUS | Status: AC
  Administered 2012-12-05: 0.4 mg via INTRAVENOUS

## 2012-12-05 MED ORDER — TECHNETIUM TC 99M SESTAMIBI GENERIC - CARDIOLITE
10.1000 | Freq: Once | INTRAVENOUS | Status: AC | PRN
  Administered 2012-12-05: 10.1 via INTRAVENOUS

## 2012-12-05 MED ORDER — TECHNETIUM TC 99M SESTAMIBI GENERIC - CARDIOLITE
30.6000 | Freq: Once | INTRAVENOUS | Status: AC | PRN
  Administered 2012-12-05: 31 via INTRAVENOUS

## 2012-12-05 NOTE — Procedures (Addendum)
[facility] [address] [place] [place] [address] [place] [address] [phone]  Cardiology Nuclear Med Study  [name] is a 72 y.o. female     MRN : [id number]     DOB: December 26, 1940  Procedure Date: 12/05/2012  Nuclear Med Background Indication for Stress Test:  Evaluation for Ischemia History:  no prior cardiac history, PVD Cardiac Risk Factors: Hypertension, NIDDM, Overweight and Smoker  Symptoms:  Chest Pain, Dizziness and SOB   Nuclear Pre-Procedure Caffeine/Decaff Intake:  9:00pm NPO After: 7:00am   IV Site: R Antecubital  IV 0.9% NS with Angio Cath:  22g  Chest Size (in):  n/a IV Started by: Koren Shiver, CNMT  Height: 5\' 4"  (1.626 m)  Cup Size: C  BMI:  Body mass index is 30.04 kg/(m^2). Weight:  175 lb (79.379 kg)   Tech Comments:  n/a    Nuclear Med Study 1 or 2 day study: 1 day  Stress Test Type:  Lexiscan  Order Authorizing Provider:  Nanetta Batty, MD   Resting Radionuclide: Technetium 85m Sestamibi  Resting Radionuclide Dose: 10.1 mCi   Stress Radionuclide:  Technetium 63m Sestamibi  Stress Radionuclide Dose: 30.6 mCi           Stress Protocol Rest HR: 68 Stress HR: 89  Rest BP: 148/81 Stress BP: 155/80  Exercise Time (min): n/a METS: n/a   Predicted Max HR: 148 bpm % Max HR: 60.14 bpm Rate Pressure Product: 69629   Dose of Adenosine (mg):  n/a Dose of Lexiscan: 0.4 mg  Dose of Atropine (mg): n/a Dose of Dobutamine: n/a mcg/kg/min (at max HR)  Stress Test Technologist: Esperanza Sheets, CCT Nuclear Technologist: Gonzella Lex, CNMT   Rest Procedure:  Myocardial perfusion imaging was performed at rest 45 minutes following the intravenous administration of Technetium 84m Sestamibi. Stress Procedure:  The patient received IV Lexiscan 0.4 mg over 15-seconds.  Technetium 59m Sestamibi injected at 30-seconds.  There were no significant changes with Lexiscan.  Quantitative spect images were obtained after a 45  minute delay.  Transient Ischemic Dilatation (Normal <1.22):  1.03 Lung/Heart Ratio (Normal <0.45):  0.24 QGS EDV:  54 ml QGS ESV:  14 ml LV Ejection Fraction: 74%  Signed by .    Rest ECG: NSR - Normal EKG  Stress ECG: No significant change from baseline ECG  QPS Raw Data Images:  Normal; no motion artifact; normal heart/lung ratio. Stress Images:  Normal homogeneous uptake in all areas of the myocardium. Rest Images:  Normal homogeneous uptake in all areas of the myocardium. Subtraction (SDS):  Normal  Impression Exercise Capacity:  Lexiscan with no exercise. BP Response:  Normal blood pressure response. Clinical Symptoms:  No significant symptoms noted. ECG Impression:  No significant ST segment change suggestive of ischemia. Comparison with Prior Nuclear Study: No images to compare  Overall Impression:  Normal stress nuclear study. Low risk stress nuclear study.  LV Wall Motion:  NL LV Function; NL Wall Motion    KELLY,THOMAS A, MD  12/05/2012 1:51 PM

## 2012-12-06 ENCOUNTER — Encounter (HOSPITAL_COMMUNITY): Payer: Self-pay | Admitting: General Practice

## 2012-12-06 ENCOUNTER — Ambulatory Visit (HOSPITAL_COMMUNITY)
Admission: RE | Admit: 2012-12-06 | Discharge: 2012-12-07 | Disposition: A | Discharge status: Home / Self Care | Payer: Medicare Other | Source: Ambulatory Visit | Attending: Cardiovascular Disease | Admitting: Cardiovascular Disease

## 2012-12-06 ENCOUNTER — Encounter (HOSPITAL_COMMUNITY)
Admission: RE | Disposition: A | Discharge status: Home / Self Care | Payer: Self-pay | Source: Ambulatory Visit | Attending: Cardiovascular Disease

## 2012-12-06 DIAGNOSIS — E663 Overweight: Secondary | ICD-10-CM | POA: Insufficient documentation

## 2012-12-06 DIAGNOSIS — E785 Hyperlipidemia, unspecified: Secondary | ICD-10-CM | POA: Insufficient documentation

## 2012-12-06 DIAGNOSIS — E119 Type 2 diabetes mellitus without complications: Secondary | ICD-10-CM | POA: Insufficient documentation

## 2012-12-06 DIAGNOSIS — I998 Other disorder of circulatory system: Secondary | ICD-10-CM | POA: Diagnosis present

## 2012-12-06 DIAGNOSIS — J449 Chronic obstructive pulmonary disease, unspecified: Secondary | ICD-10-CM | POA: Diagnosis present

## 2012-12-06 DIAGNOSIS — Z79899 Other long term (current) drug therapy: Secondary | ICD-10-CM | POA: Insufficient documentation

## 2012-12-06 DIAGNOSIS — K219 Gastro-esophageal reflux disease without esophagitis: Secondary | ICD-10-CM | POA: Insufficient documentation

## 2012-12-06 DIAGNOSIS — F17200 Nicotine dependence, unspecified, uncomplicated: Secondary | ICD-10-CM | POA: Insufficient documentation

## 2012-12-06 DIAGNOSIS — L98499 Non-pressure chronic ulcer of skin of other sites with unspecified severity: Secondary | ICD-10-CM | POA: Insufficient documentation

## 2012-12-06 DIAGNOSIS — J4489 Other specified chronic obstructive pulmonary disease: Secondary | ICD-10-CM | POA: Insufficient documentation

## 2012-12-06 DIAGNOSIS — I708 Atherosclerosis of other arteries: Secondary | ICD-10-CM | POA: Insufficient documentation

## 2012-12-06 DIAGNOSIS — Z720 Tobacco use: Secondary | ICD-10-CM | POA: Diagnosis present

## 2012-12-06 DIAGNOSIS — I10 Essential (primary) hypertension: Secondary | ICD-10-CM | POA: Insufficient documentation

## 2012-12-06 DIAGNOSIS — L97509 Non-pressure chronic ulcer of other part of unspecified foot with unspecified severity: Secondary | ICD-10-CM | POA: Insufficient documentation

## 2012-12-06 DIAGNOSIS — I739 Peripheral vascular disease, unspecified: Secondary | ICD-10-CM | POA: Insufficient documentation

## 2012-12-06 HISTORY — DX: Headache: R51

## 2012-12-06 HISTORY — DX: Other disorder of circulatory system: I99.8

## 2012-12-06 HISTORY — DX: Other forms of dyspnea: R06.09

## 2012-12-06 HISTORY — DX: Osteoarthritis of knee, unspecified: M17.9

## 2012-12-06 HISTORY — DX: Non-pressure chronic ulcer of other part of unspecified foot with unspecified severity: L97.509

## 2012-12-06 HISTORY — DX: Dyspnea, unspecified: R06.00

## 2012-12-06 HISTORY — DX: Carpal tunnel syndrome, right upper limb: G56.01

## 2012-12-06 HISTORY — DX: Iron deficiency anemia, unspecified: D50.9

## 2012-12-06 HISTORY — DX: Barrett's esophagus without dysplasia: K22.70

## 2012-12-06 HISTORY — DX: Unilateral primary osteoarthritis, unspecified knee: M17.10

## 2012-12-06 HISTORY — DX: Residual hemorrhoidal skin tags: K64.4

## 2012-12-06 HISTORY — DX: Headache, unspecified: R51.9

## 2012-12-06 HISTORY — DX: Peripheral vascular disease, unspecified: I73.9

## 2012-12-06 HISTORY — PX: LOWER EXTREMITY ANGIOGRAM: SHX5508

## 2012-12-06 HISTORY — DX: Type 2 diabetes mellitus without complications: E11.9

## 2012-12-06 HISTORY — DX: Tobacco use: Z72.0

## 2012-12-06 HISTORY — DX: Unspecified chronic bronchitis: J42

## 2012-12-06 HISTORY — DX: Pure hypercholesterolemia, unspecified: E78.00

## 2012-12-06 LAB — GLUCOSE, CAPILLARY: Glucose-Capillary: 149 mg/dL — ABNORMAL HIGH (ref 70–99)

## 2012-12-06 SURGERY — ANGIOGRAM, LOWER EXTREMITY
Anesthesia: LOCAL

## 2012-12-06 MED ORDER — SODIUM CHLORIDE 0.9 % IV SOLN: INTRAVENOUS | Status: AC

## 2012-12-06 MED ORDER — SODIUM CHLORIDE 0.9 % IJ SOLN: 3.0000 mL | INTRAMUSCULAR | Status: DC | PRN

## 2012-12-06 MED ORDER — DIAZEPAM 5 MG PO TABS
5.0000 mg | ORAL_TABLET | ORAL | Status: AC
  Administered 2012-12-06: 5 mg via ORAL
  Filled 2012-12-06: qty 1

## 2012-12-06 MED ORDER — PANTOPRAZOLE SODIUM 40 MG PO TBEC
80.0000 mg | DELAYED_RELEASE_TABLET | Freq: Every day | ORAL | Status: DC
  Administered 2012-12-07: 12:00:00 80 mg via ORAL
  Filled 2012-12-06 (×3): qty 1

## 2012-12-06 MED ORDER — FENTANYL CITRATE 0.05 MG/ML IJ SOLN
INTRAMUSCULAR | Status: AC
  Filled 2012-12-06: qty 2

## 2012-12-06 MED ORDER — PREDNISONE 5 MG PO TABS
5.0000 mg | ORAL_TABLET | ORAL | Status: DC
  Administered 2012-12-07: 12:00:00 5 mg via ORAL
  Filled 2012-12-06: qty 1

## 2012-12-06 MED ORDER — ONDANSETRON HCL 4 MG/2ML IJ SOLN: 4.0000 mg | Freq: Four times a day (QID) | INTRAMUSCULAR | Status: DC | PRN

## 2012-12-06 MED ORDER — ACETAMINOPHEN 325 MG PO TABS: 650.0000 mg | ORAL_TABLET | ORAL | Status: DC | PRN

## 2012-12-06 MED ORDER — HYDROCHLOROTHIAZIDE 12.5 MG PO CAPS
12.5000 mg | ORAL_CAPSULE | Freq: Every day | ORAL | Status: DC
  Administered 2012-12-06: 12.5 mg via ORAL
  Filled 2012-12-06 (×2): qty 1

## 2012-12-06 MED ORDER — HYDRALAZINE HCL 20 MG/ML IJ SOLN: 10.0000 mg | INTRAMUSCULAR | Status: DC

## 2012-12-06 MED ORDER — ALBUTEROL SULFATE HFA 108 (90 BASE) MCG/ACT IN AERS
1.0000 | INHALATION_SPRAY | Freq: Four times a day (QID) | RESPIRATORY_TRACT | Status: DC | PRN
  Filled 2012-12-06: qty 6.7

## 2012-12-06 MED ORDER — GLYBURIDE 2.5 MG PO TABS: 2.5000 mg | ORAL_TABLET | Freq: Every day | ORAL | Status: DC

## 2012-12-06 MED ORDER — LIDOCAINE HCL (PF) 1 % IJ SOLN
INTRAMUSCULAR | Status: AC
  Filled 2012-12-06: qty 30

## 2012-12-06 MED ORDER — MOMETASONE FURO-FORMOTEROL FUM 100-5 MCG/ACT IN AERO
2.0000 | INHALATION_SPRAY | Freq: Two times a day (BID) | RESPIRATORY_TRACT | Status: DC
  Administered 2012-12-06 – 2012-12-07 (×2): 2 via RESPIRATORY_TRACT
  Filled 2012-12-06: qty 8.8

## 2012-12-06 MED ORDER — ASPIRIN EC 81 MG PO TBEC: 81.0000 mg | DELAYED_RELEASE_TABLET | Freq: Every day | ORAL | Status: DC

## 2012-12-06 MED ORDER — METFORMIN HCL 500 MG PO TABS: 500.0000 mg | ORAL_TABLET | Freq: Every day | ORAL | Status: DC

## 2012-12-06 MED ORDER — FAMOTIDINE 20 MG PO TABS
20.0000 mg | ORAL_TABLET | Freq: Every day | ORAL | Status: DC
  Administered 2012-12-07: 20 mg via ORAL
  Filled 2012-12-06: qty 1

## 2012-12-06 MED ORDER — HYDROMORPHONE HCL PF 2 MG/ML IJ SOLN
INTRAMUSCULAR | Status: AC
  Filled 2012-12-06: qty 1

## 2012-12-06 MED ORDER — OLMESARTAN MEDOXOMIL-HCTZ 40-12.5 MG PO TABS: 1.0000 | ORAL_TABLET | Freq: Every morning | ORAL | Status: DC

## 2012-12-06 MED ORDER — MORPHINE SULFATE 2 MG/ML IJ SOLN
2.0000 mg | INTRAMUSCULAR | Status: DC | PRN
  Administered 2012-12-07 (×3): 2 mg via INTRAVENOUS
  Filled 2012-12-06 (×3): qty 1

## 2012-12-06 MED ORDER — SODIUM CHLORIDE 0.9 % IV SOLN
INTRAVENOUS | Status: DC
  Administered 2012-12-06: 13:00:00 via INTRAVENOUS

## 2012-12-06 MED ORDER — MIDAZOLAM HCL 2 MG/2ML IJ SOLN
INTRAMUSCULAR | Status: AC
  Filled 2012-12-06: qty 2

## 2012-12-06 MED ORDER — HEPARIN (PORCINE) IN NACL 2-0.9 UNIT/ML-% IJ SOLN
INTRAMUSCULAR | Status: AC
  Filled 2012-12-06: qty 1000

## 2012-12-06 MED ORDER — THEOPHYLLINE ER 200 MG PO TB12
200.0000 mg | ORAL_TABLET | Freq: Two times a day (BID) | ORAL | Status: DC
  Administered 2012-12-06 – 2012-12-07 (×2): 200 mg via ORAL
  Filled 2012-12-06 (×3): qty 1

## 2012-12-06 MED ORDER — OLMESARTAN MEDOXOMIL-HCTZ 20-12.5 MG PO TABS: 1.0000 | ORAL_TABLET | Freq: Every day | ORAL | Status: DC

## 2012-12-06 MED ORDER — FLUTICASONE PROPIONATE 50 MCG/ACT NA SUSP
2.0000 | Freq: Every day | NASAL | Status: DC | PRN
  Filled 2012-12-06: qty 16

## 2012-12-06 MED ORDER — IRBESARTAN 150 MG PO TABS
150.0000 mg | ORAL_TABLET | Freq: Every day | ORAL | Status: DC
  Administered 2012-12-06: 22:00:00 150 mg via ORAL
  Filled 2012-12-06 (×2): qty 1

## 2012-12-06 MED ORDER — GLYBURIDE-METFORMIN 2.5-500 MG PO TABS: 1.0000 | ORAL_TABLET | Freq: Every day | ORAL | Status: DC

## 2012-12-06 MED ORDER — SIMETHICONE 80 MG PO CHEW
80.0000 mg | CHEWABLE_TABLET | Freq: Once | ORAL | Status: AC
  Administered 2012-12-06: 22:00:00 80 mg via ORAL
  Filled 2012-12-06: qty 1

## 2012-12-06 MED ORDER — ASPIRIN EC 325 MG PO TBEC
325.0000 mg | DELAYED_RELEASE_TABLET | Freq: Every day | ORAL | Status: DC
  Administered 2012-12-07: 12:00:00 325 mg via ORAL
  Filled 2012-12-06: qty 1

## 2012-12-06 NOTE — H&P (Signed)
  H & P will be scanned in.  Pt was reexamined and existing H & P reviewed. No changes found.  Runell Gess, MD [facility] 12/06/2012 3:13 PM

## 2012-12-06 NOTE — CV Procedure (Signed)
[name] is a 72 y.o. female    [id number] LOCATION:  FACILITY: [facility]  PHYSICIAN: Nanetta Batty, M.D. 12-25-1940   DATE OF PROCEDURE:  12/06/2012  DATE OF DISCHARGE:  [facility]  [facility]    History obtained from chart review. [name] is a 72 year old mildly overweight widowed African American female no children is retired Engineer, civil (consulting) from [employer]. She lives with her sister. She took was referred to me by Dr. Fanny Dance for peripheral vascular evaluation because of painful left foot with nonhealing ulcers.  Her cardiac risk factor profile is  positive for 25 pack years of tobacco abuse currently smoking one half pack per day. She is A. Treated diabetic, hypertensive and hyperlipidemic intolerant to statin drugs. She does have COPD with dyspnea on exertion with a gets occasional chest heaviness that she had a Myoview in our office that was normal. The  patient does have Barrett's esophagus and GERD. She has complained of left foot pain with left calf claudication the last month and was referred for Dopplers that showed a left ABI of 0.35. She presents now for angiography and potential percutaneous intervention for critical limb ischemia.   PROCEDURE DESCRIPTION:    The patient was brought to the second floor  [facility] lab in the postabsorptive state. She was  premedicated with Valium 5 mg by mouth, IV Versed and fentanyl. Her right and left groinsWere prepped and shaved in usual sterile fashion. Xylocaine 1% was used for local anesthesia. A 5 French sheath was inserted into the right common femoral artery using standard Seldinger technique. 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 Therefore access is obtained in the left common femoral artery using standard Seldinger technique. A 5 French pigtail catheter was used for abdominal  aortography with bifemoral runoff using bolus chase digital subtraction step table technique. Visipaque dye was used for the entirety of the case. Retrograde aortic, left ventricular and pullback pressures were recorded.   HEMODYNAMICS:    AO SYSTOLIC/AO DIASTOLIC: 150/64   ANGIOGRAPHIC RESULTS:   1: Abdominal aortogram-renal artery is widely patent. The infrarenal abdominal aorta was free of significant atherosclerotic changes.  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 There is two-vessel runoff below the knee with an occluded posterior tibial artery just above the ankle that was collateralized by the peroneal.  3: There was a short segment calcified occlusion of the mid left SFA. There was one vessel runoff via the peroneal.  IMPRESSION: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 She'll need to have this fixed percutaneously  by antegrade access of the left SFA and diamondback orbital rotational arthrectomy, PTA and/or stenting. She'll be kept overnight and hydrated, discharged the morning arrangements will be made to complete her procedure early next week. She left the Cath Lab in stable condition.  Runell Gess MD, [facility] 12/06/2012 4:09 PM

## 2012-12-06 NOTE — Progress Notes (Signed)
Utilization Review Completed.   Delio Slates, RN, BSN Nurse Case Manager  [phone]  

## 2012-12-07 ENCOUNTER — Encounter (HOSPITAL_COMMUNITY): Payer: Medicare Other

## 2012-12-07 ENCOUNTER — Encounter (HOSPITAL_COMMUNITY): Payer: Self-pay | Admitting: Cardiology

## 2012-12-07 DIAGNOSIS — I739 Peripheral vascular disease, unspecified: Secondary | ICD-10-CM

## 2012-12-07 DIAGNOSIS — I70229 Atherosclerosis of native arteries of extremities with rest pain, unspecified extremity: Secondary | ICD-10-CM

## 2012-12-07 DIAGNOSIS — I998 Other disorder of circulatory system: Secondary | ICD-10-CM | POA: Diagnosis present

## 2012-12-07 DIAGNOSIS — Z720 Tobacco use: Secondary | ICD-10-CM | POA: Diagnosis present

## 2012-12-07 DIAGNOSIS — L97509 Non-pressure chronic ulcer of other part of unspecified foot with unspecified severity: Secondary | ICD-10-CM

## 2012-12-07 HISTORY — DX: Tobacco use: Z72.0

## 2012-12-07 HISTORY — DX: Non-pressure chronic ulcer of other part of unspecified foot with unspecified severity: L97.509

## 2012-12-07 HISTORY — DX: Peripheral vascular disease, unspecified: I73.9

## 2012-12-07 HISTORY — DX: Atherosclerosis of native arteries of extremities with rest pain, unspecified extremity: I70.229

## 2012-12-07 LAB — BASIC METABOLIC PANEL
BUN: 14 mg/dL (ref 6–23)
CO2: 23 mEq/L (ref 19–32)
Chloride: 100 mEq/L (ref 96–112)
GFR calc non Af Amer: 64 mL/min — ABNORMAL LOW (ref >90)
Glucose, Bld: 209 mg/dL — ABNORMAL HIGH (ref 70–99)
Potassium: 4.6 mEq/L (ref 3.5–5.1)
Sodium: 135 mEq/L (ref 135–145)

## 2012-12-07 LAB — CBC
HCT: 33 % — ABNORMAL LOW (ref 36.0–46.0)
Hemoglobin: 10.1 g/dL — ABNORMAL LOW (ref 12.0–15.0)
MCH: 27 pg (ref 26.0–34.0)
MCHC: 30.6 g/dL (ref 30.0–36.0)
MCV: 88.2 fL (ref 78.0–100.0)
RBC: 3.74 MIL/uL — ABNORMAL LOW (ref 3.87–5.11)

## 2012-12-07 LAB — GLUCOSE, CAPILLARY: Glucose-Capillary: 224 mg/dL — ABNORMAL HIGH (ref 70–99)

## 2012-12-07 MED ORDER — HYDROCODONE-ACETAMINOPHEN 5-325 MG PO TABS: 1.0000 | ORAL_TABLET | ORAL | Status: DC | PRN

## 2012-12-07 MED ORDER — HYDROCODONE-ACETAMINOPHEN 5-325 MG PO TABS
1.0000 | ORAL_TABLET | ORAL | Status: DC | PRN
  Administered 2012-12-07 (×2): 1 via ORAL
  Filled 2012-12-07 (×2): qty 1

## 2012-12-07 MED ORDER — COLLAGENASE 250 UNIT/GM EX OINT
TOPICAL_OINTMENT | Freq: Every day | CUTANEOUS | Status: DC
  Administered 2012-12-07: 12:00:00 via TOPICAL
  Filled 2012-12-07: qty 30

## 2012-12-07 MED ORDER — COLLAGENASE 250 UNIT/GM EX OINT: TOPICAL_OINTMENT | Freq: Every day | CUTANEOUS | Status: DC

## 2012-12-07 NOTE — Discharge Instructions (Signed)
Call [facility] if any bleeding, swelling or drainage at cath site.  May shower, no tub baths for 48 hours for groin sticks.   Dressing changes daily with Santyl as before hospitalization.  DO NOT TAKE metformin glyburide until 12/09/12, it may interact with cath Dye.  We will admit you on 12/09/12 for intervention to the arterial blockage in you Lt. Leg on Sunday.  The hospital will call you when the bed is ready.  Come into the hospital before 5 pm  Heart Healthy and diabetic diet.   No driving for 2 days.  No lifting over 5 pounds for 2 days. Acetaminophen; Hydrocodone tablets or capsules What is this medicine? ACETAMINOPHEN; HYDROCODONE (a set a MEE noe fen; hye droe KOE done) is a pain reliever. It is used to treat mild to moderate pain. This medicine may be used for other purposes; ask your health care provider or pharmacist if you have questions. What should I tell my health care provider before I take this medicine? They need to know if you have any of these conditions: -brain tumor -Crohn's disease, inflammatory bowel disease, or ulcerative colitis -drink more than 3 alcohol-containing drinks per day -drug abuse or addiction -head injury -heart or circulation problems -kidney disease or problems going to the bathroom -liver disease -lung disease, asthma, or breathing problems -an unusual or allergic reaction to acetaminophen, hydrocodone, other opioid analgesics, other medicines, foods, dyes, or preservatives -pregnant or trying to get pregnant -breast-feeding How should I use this medicine? Take this medicine by mouth. Swallow it with a full glass of water. Follow the directions on the prescription label. If the medicine upsets your stomach, take the medicine with food or milk. Do not take more than you are told to take. Talk to your pediatrician regarding the use of this medicine in children. This medicine is not approved for use in  children. Overdosage: If you think you have taken too much of this medicine contact a poison control center or emergency room at once. NOTE: This medicine is only for you. Do not share this medicine with others. What if I miss a dose? If you miss a dose, take it as soon as you can. If it is almost time for your next dose, take only that dose. Do not take double or extra doses. What may interact with this medicine? -alcohol -antihistamines -isoniazid -medicines for depression, anxiety, or psychotic disturbances -medicines for sleep -muscle relaxants -naltrexone -narcotic medicines (opiates) for pain -phenobarbital -ritonavir -tramadol This list may not describe all possible interactions. Give your health care provider a list of all the medicines, herbs, non-prescription drugs, or dietary supplements you use. Also tell them if you smoke, drink alcohol, or use illegal drugs. Some items may interact with your medicine. What should I watch for while using this medicine? Tell your doctor or health care professional if your pain does not go away, if it gets worse, or if you have new or a different type of pain. You may develop tolerance to the medicine. Tolerance means that you will need a higher dose of the medicine for pain relief. Tolerance is normal and is expected if you take the medicine for a long time. Do not suddenly stop taking your medicine because you may develop a severe reaction. Your body becomes used to the medicine. This does NOT mean you are addicted. Addiction is a behavior related to getting and using a drug for a non-medical reason. If you have pain,  you have a medical reason to take pain medicine. Your doctor will tell you how much medicine to take. If your doctor wants you to stop the medicine, the dose will be slowly lowered over time to avoid any side effects. You may get drowsy or dizzy when you first start taking the medicine or change doses. Do not drive, use machinery, or  do anything that may be dangerous until you know how the medicine affects you. Stand or sit up slowly. There are different types of narcotic medicines (opiates) for pain. If you take more than one type at the same time, you may have more side effects. Give your health care provider a list of all medicines you use. Your doctor will tell you how much medicine to take. Do not take more medicine than directed. Call emergency for help if you have problems breathing. The medicine will cause constipation. Try to have a bowel movement at least every 2 to 3 days. If you do not have a bowel movement for 3 days, call your doctor or health care professional. Too much acetaminophen can be very dangerous. Do not take Tylenol (acetaminophen) or medicines that contain acetaminophen with this medicine. Many non-prescription medicines contain acetaminophen. Always read the labels carefully. What side effects may I notice from receiving this medicine? Side effects that you should report to your doctor or health care professional as soon as possible: -allergic reactions like skin rash, itching or hives, swelling of the face, lips, or tongue -breathing problems -confusion -feeling faint or lightheaded, falls -stomach pain -yellowing of the eyes or skin Side effects that usually do not require medical attention (report to your doctor or health care professional if they continue or are bothersome): -nausea, vomiting -stomach upset This list may not describe all possible side effects. Call your doctor for medical advice about side effects. You may report side effects to FDA at 1-800-FDA-1088. Where should I keep my medicine? Keep out of the reach of children. This medicine can be abused. Keep your medicine in a safe place to protect it from theft. Do not share this medicine with anyone. Selling or giving away this medicine is dangerous and against the law. Store at room temperature between 15 and 30 degrees C (59 and 86  degrees F). Protect from light. Keep container tightly closed.  Throw away any unused medicine after the expiration date. Discard unused medicine and used packaging carefully. Pets and children can be harmed if they find used or lost packages. NOTE: This sheet is a summary. It may not cover all possible information. If you have questions about this medicine, talk to your doctor, pharmacist, or health care provider.  2013, Elsevier/Gold Standard. (07/25/2011 1:04:52 PM) Collagenase ointment What is this medicine? COLLAGENASE (kohl LAH jen ace) is an enzyme that breaks down collagen in damaged tissue and helps healthy tissue to grow. It may help wounds heal faster. This medicine may be used for other purposes; ask your health care provider or pharmacist if you have questions. What should I tell my health care provider before I take this medicine? They need to know if you have any of these conditions: -an unusual or allergic reaction to collagenase, other medicines, foods, dyes, or preservatives -pregnant or trying to get pregnant -breast-feeding How should I use this medicine? This medicine is for external use only. Do not take by mouth. Wash the wound as directed by your health care professional. Do not scrub. If you are applying a topical antibiotic to the  wound, apply the antibiotic before this medicine. Do not touch the tip of the ointment tube with any surface, especially your fingers or the wound. Try to only get the ointment on the wound itself. If some gets on normal skin, wipe away with a sterile gauze pad. Do not use your medicine more often than directed. Talk to your pediatrician regarding the use of this medicine in children. Special care may be needed. Overdosage: If you think you have taken too much of this medicine contact a poison control center or emergency room at once. NOTE: This medicine is only for you. Do not share this medicine with others. What if I miss a dose? If you miss  a dose, use it as soon as you can. If it is almost time for your next dose, use only that dose. Do not use double or extra doses. What may interact with this medicine? Do not take this medicine with any of the following medications: -aluminum acetate, Burow's solution -povidone iodine -silver nitrate -silver sulfadiazine This list may not describe all possible interactions. Give your health care provider a list of all the medicines, herbs, non-prescription drugs, or dietary supplements you use. Also tell them if you smoke, drink alcohol, or use illegal drugs. Some items may interact with your medicine. What should I watch for while using this medicine? Visit your doctor or health care professional for regular checks on your progress. If your wound begins to look worse, smell bad, has colored discharge or more discharge, or increases in size, contact your health care professional. You may have an infection or need a change in your treatment. If you develop a fever, chills, low blood pressure, dizziness, rapid heartbeat, or confusion, contact your health care professional immediately. You may have an infection of your blood. What side effects may I notice from receiving this medicine? Side effects that you should report to your doctor or health care professional as soon as possible: -breathing problems -skin rash or hives Side effects that usually do not require medical attention (report to your doctor or health care professional if they continue or are bothersome): -redness at the application site This list may not describe all possible side effects. Call your doctor for medical advice about side effects. You may report side effects to FDA at 1-800-FDA-1088. Where should I keep my medicine? Keep out of the reach of children. Store below 25 degrees C (77 degrees F). Throw away any unused medication after the expiration date. NOTE: This sheet is a summary. It may not cover all possible information.  If you have questions about this medicine, talk to your doctor, pharmacist, or health care provider.  2013, Elsevier/Gold Standard. (02/26/2008 4:39:42 PM)

## 2012-12-07 NOTE — Discharge Summary (Signed)
Physician Discharge Summary  Patient ID: [name] MRN: [id number] DOB/AGE: 1941/08/25 72 y.o.  Admit date: 12/06/2012 Discharge date: 12/07/2012  Discharge Diagnoses:  Principal Problem:  *Critical lower limb ischemia, with ulcer Lt foot Active Problems:  Non-healing ulcer of foot, secondary to diabetes and PVD  DIABETES-TYPE 2  HYPERTENSION  COPD  GERD  PVD (peripheral vascular disease), abnormal ABIs, need PCI to occlusion of mid LSFA to be scheduled  Tobacco abuse   Discharged Condition: stable  PROCEDURES: PV angiogram 12/06/12 by Dr. Crista Curb Course: [name] is a 72 year old mildly overweight widowed African American female, no children, retired Engineer, civil (consulting) from [employer]. She lives with her sister. She was referred to Dr. Allyson Sabal by Dr. Fanny Dance for peripheral vascular evaluation because of painful left foot with nonhealing ulcers.   Her cardiac risk factor profile is positive for 25 pack years of tobacco abuse currently smoking one half pack per day. She is a treated diabetic, hypertensive and hyperlipidemic intolerant to statin drugs. She does have COPD with dyspnea on exertion with  occasional chest heaviness.  She had a Myoview in our office that was normal. The patient does have Barrett's esophagus and GERD. She has complained of left foot pain with left calf claudication for the last month and was referred for Dopplers that showed a left ABI of 0.35. Dr Fanny Dance had done Xrays of the foot and per the pt there was no osteomyelitis.  She has been using Santyl on the foot at home.  She presented 12/06/12 for angiography for critical limb ischemia.     PV procedure revealed:  1: Abdominal aortogram-renal artery is widely patent. The infrarenal abdominal aorta was free of significant atherosclerotic changes.  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 There is two-vessel runoff below  the knee with an occluded posterior tibial artery just above the ankle that was collateralized by the peroneal.  3: There was a short segment calcified occlusion of the mid left SFA. There was one vessel runoff via the peroneal  She'll need to have this fixed percutaneously by antegrade access of the left SFA and diamondback orbital rotational arthrectomy, PTA and/or stenting.    She was kept in the hospital overnight and wishes to go home today.  She is having increased pain and difficulty ambulating.  We have added Vicodin and have asked PT/OT for consult and will assist with those recommendations.    Plan is to discharge home today and bring back Sunday afternoon for labs and IV fluids.  Then she will undergo PCI to the mid Lt. SFA on Monday AM at 0700.  Pt agreeable with plan.     Consults: None  Significant Diagnostic Studies:  BMET    Component Value Date/Time   NA 135 12/07/2012 0610   K 4.6 12/07/2012 0610   CL 100 12/07/2012 0610   CO2 23 12/07/2012 0610   GLUCOSE 209* 12/07/2012 0610   BUN 14 12/07/2012 0610   CREATININE 0.88 12/07/2012 0610   CALCIUM 9.8 12/07/2012 0610   GFRNONAA 64* 12/07/2012 0610   GFRAA 74* 12/07/2012 0610    CBC    Component Value Date/Time   WBC 10.2 12/07/2012 0610   RBC 3.74* 12/07/2012 0610   HGB 10.1* 12/07/2012 0610   HCT 33.0* 12/07/2012 0610   PLT 243 12/07/2012 0610   MCV 88.2 12/07/2012 0610   MCH 27.0 12/07/2012 0610   MCHC  30.6 12/07/2012 0610   RDW 16.2* 12/07/2012 0610   LYMPHSABS 1.9 03/15/2012 1139   MONOABS 0.8 03/15/2012 1139   EOSABS 0.2 03/15/2012 1139   BASOSABS 0.1 03/15/2012 1139       Discharge Exam: Blood pressure 137/51, pulse 78, temperature 99 F (37.2 C), temperature source Oral, resp. rate 21, height 5\' 4"  (1.626 m), weight 78 kg (171 lb 15.3 oz), SpO2 95.00%.  PE at AM of discharge: General: alert and oriented, pleasant affect.  Heart:S1S2 RRR  Lungs:few wheezes and rhonchi  Abd:+ BS soft, non tender  Ext:Lt foot  wrapped  Disposition: 01-Home or Self Care  Discharge Orders    Future Appointments: Provider: Department: Dept Phone: Center:   01/24/2013 10:00 AM Romero Belling, MD [facility] [phone] None       Medication List     As of 12/07/2012 11:17 AM    TAKE these medications         albuterol 108 (90 BASE) MCG/ACT inhaler   Commonly known as: PROVENTIL HFA;VENTOLIN HFA   Inhale 1 puff into the lungs every 6 (six) hours as needed. FOR SHORTNESS OF BREATH      ammonium lactate 12 % lotion   Commonly known as: LAC-HYDRIN   Apply 1 application topically daily as needed. For skin condition.      aspirin EC 81 MG tablet   Take 81 mg by mouth daily.      Clobetasol Propionate 0.05 % external spray   Commonly known as: TEMOVATE   Apply 1 spray topically 2 (two) times daily as needed. For psoriasis flare-ups      collagenase ointment   Commonly known as: SANTYL   Apply topically daily. Apply to affected area Lt. Foot daily      esomeprazole 40 MG capsule   Commonly known as: NEXIUM   Take 40 mg by mouth daily before breakfast.      Eye Vitamins Caps   Take 1 capsule by mouth daily.      fluticasone 50 MCG/ACT nasal spray   Commonly known as: FLONASE   Place 2 sprays into the nose daily as needed. For allergies      Fluticasone-Salmeterol 250-50 MCG/DOSE Aepb   Commonly known as: ADVAIR   Inhale 1 puff into the lungs every 12 (twelve) hours.      glyBURIDE-metformin 2.5-500 MG per tablet   Commonly known as: GLUCOVANCE   Take 1 tablet by mouth daily with breakfast.      HYDROcodone-acetaminophen 5-325 MG per tablet   Commonly known as: NORCO/VICODIN   Take 1 tablet by mouth every 4 (four) hours as needed for pain.      MYLANTA GAS PO   Take 1 tablet by mouth daily as needed. For gas      olmesartan-hydrochlorothiazide 40-12.5 MG per tablet   Commonly known as: BENICAR HCT   Take 1 tablet by mouth every morning.      BENICAR HCT 20-12.5 MG per  tablet   Generic drug: olmesartan-hydrochlorothiazide   Take 1 tablet by mouth at bedtime.      predniSONE 5 MG tablet   Commonly known as: DELTASONE   Take 5 mg by mouth daily. On Monday, Wednesday, Friday      ranitidine 150 MG capsule   Commonly known as: ZANTAC   Take 150 mg by mouth at bedtime.      theophylline 200 MG 12 hr tablet   Commonly known as: THEODUR   Take 200 mg  by mouth 2 (two) times daily.      Travoprost (BAK Free) 0.004 % Soln ophthalmic solution   Commonly known as: TRAVATAN   Place 1 drop into both eyes at bedtime.           Follow-up Information    Follow up with Runell Gess, MD. (we will arrange outpatient appts. after the intervention on Monday)    Contact information:   [address] [place] [address] [phone]        Discharge Instructions:  Call [facility] if any bleeding, swelling or drainage at cath site.  May shower, no tub baths for 48 hours for groin sticks.   Dressing changes daily with Santyl as before hospitalization.  DO NOT TAKE metformin glyburide until 12/09/12, it may interact with cath Dye.  We will admit you on 12/09/12 for intervention to the arterial blockage in you Lt. Leg on Sunday.  The hospital will call you when the bed is ready.  Come into the hospital before 5 pm  Heart Healthy and diabetic diet.   No driving for 2 days.  No lifting over 5 pounds for 2 days. SignedLeone Brand 12/07/2012, 11:17 AM

## 2012-12-07 NOTE — Evaluation (Signed)
Physical Therapy Evaluation Patient Details Name: [name] MRN: [id number] DOB: [birth year] Today's Date: 12/07/2012 Time: 5366-4403 PT Time Calculation (min): 30 min  PT Assessment / Plan / Recommendation Clinical Impression  Patient is a 72 y.o. female admitted with critical lower limb ischemia with ulcer left foot who is safe with use of rolling walker for discharge home.  Plans readmission Sunday evening for prep for revascularization procedure.  Would recommend new PT eval after procedure to see if follow up PT needed at that time.      PT Assessment  Patent does not need any further PT services    Follow Up Recommendations  No PT follow up                Equipment Recommendations  Rolling walker with 5" wheels               Precautions / Restrictions Precautions Precautions: Fall   Pertinent Vitals/Pain 7/10 left foot with trying to stand      Mobility  Bed Mobility Details for Bed Mobility Assistance: NT, patient sitting at edge of bed dressing for pending discharge Transfers Transfers: Sit to Stand;Stand to Sit Sit to Stand: 6: Modified independent (Device/Increase time);With upper extremity assist Stand to Sit: 6: Modified independent (Device/Increase time);With upper extremity assist Details for Transfer Assistance: initially with difficulty due to foot pain, but able to get up unaided after couple of attemtps Ambulation/Gait Ambulation/Gait Assistance: 5: Supervision Ambulation Distance (Feet): 90 Feet Assistive device: Rolling walker Ambulation/Gait Assistance Details: antalgic on the left, but tolerated more weight with increased ambulation, cues initially for walker safety and educated/simulated one step for home entry. Gait Pattern: Antalgic;Decreased stance time - left;Decreased step length - right                PT Goals Acute Rehab PT Goals PT Goal Formulation: With patient  Visit Information  Last PT Received On: 12/07/12      Subjective Data  Subjective: I am getting out of here just for a minute Patient Stated Goal: To go home, then come back to get procedure to help with pain in foot   Prior Functioning  Home Living Lives With: Alone Available Help at Discharge: Family;Available PRN/intermittently Type of Home: House Home Access: Stairs to enter [employer] of Steps: 1 Entrance Stairs-Rails: None Home Layout: One level Firefighter: Standard Home Adaptive Equipment: Environmental consultant - four wheeled;Straight cane Prior Function Level of Independence: Independent with assistive device(s) Able to Take Stairs?: Yes Driving: Yes Vocation: Retired Musician: No difficulties    Cognition  Overall Cognitive Status: Appears within functional limits for tasks assessed/performed Arousal/Alertness: Awake/alert Orientation Level: Appears intact for tasks assessed Behavior During Session: [facility] for tasks performed    Extremity/Trunk Assessment Right Lower Extremity Assessment RLE ROM/Strength/Tone: [facility] for tasks assessed Left Lower Extremity Assessment LLE ROM/Strength/Tone: Deficits;Due to pain LLE ROM/Strength/Tone Deficits: moves ankle, but painful and strength not tested due to pain   Balance    End of Session PT - End of Session Equipment Utilized During Treatment: Gait belt Activity Tolerance: Patient tolerated treatment well Patient left: in bed;with call bell/phone within reach  GP Functional Assessment Tool Used: Clinical Judgement Functional Limitation: Mobility: Walking and moving around Mobility: Walking and Moving Around Current Status ([id number]): At least 20 percent but less than 40 percent impaired, limited or restricted Mobility: Walking and Moving Around Goal Status [phone]): At least 20 percent but less than 40 percent impaired, limited or restricted Mobility: Walking  and Moving Around Discharge Status [id number]): At least 20 percent but less than 40 percent impaired,  limited or restricted   [facility] 12/07/2012, 1:47 PM  [place],  [phone] 12/07/2012

## 2012-12-07 NOTE — Consult Note (Signed)
WOC consult Note Reason for Consult: eval ulcerations of the left LE.  Pt with PAD, with plans for intervention first of next week. Has several areas of ulceration on the left foot. Her great toe is discolored, cool and erythematous.  Wound type: arterial ulcerations x5, one may have component of pressure Pressure Ulcer POA: Yes Measurement: Left lateral heel: 0.5cm x 1.0cm x0cm Left 2nd toe and left 5 th toe: 0.5cm 0.5cm x 0cm Left lateral 5th met. Head: 1.5cm x 2.0cm x 0.2cm  Left medial 1st met. Head: 1.5cm x 2.0cm x 0.2cm  Wound bed: Left lateral heel: eschar 100% Left 2nd toe and left 5 th toe: dark tissue Left lateral 5th met. Head: 100% yellow slough Left medial 1st met. Head: 95% yellow slough Drainage (amount, consistency, odor)  Minimal from the left lateral and left medial wounds Periwound: intact however erythema at the left lateral and medial wounds, fluctuance in the heel  Dressing procedure/placement/frequency: will paint heel with betadine to keep stable at this point since it is not open.  Santyl for the left lateral and medial wounds to begin the debridement process since stenting planned soon.  No need for dressing for the toe tips at this time.  Discussed POC with bedside nursing they will apply Santyl once it arrives to the room. Pt reports she has used this medication in the past and would be comfortable continuing this at home should she be discharged.  Re consult if needed, will not follow at this time. Thanks  Marquette Blodgett [employer], CWOCN [phone])

## 2012-12-07 NOTE — Progress Notes (Signed)
Subjective: Complains of lt foot pain, has been taking advil at home.  Now pain increased.  Though improved with her foot hanging off bed.  Objective: Vital signs in last 24 hours: Temp:  [98.1 F (36.7 C)-99.2 F (37.3 C)] 99 F (37.2 C) (01/10 0759) Pulse Rate:  [77-107] 78  (01/10 0759) Resp:  [15-24] 21  (01/10 0759) BP: (110-155)/(51-66) 137/51 mmHg (01/10 0759) SpO2:  [94 %-99 %] 95 % (01/10 0759) Weight:  [78 kg (171 lb 15.3 oz)-83.008 kg (183 lb)] 78 kg (171 lb 15.3 oz) (01/10 0000) Weight change:  Last BM Date: 12/06/12 Intake/Output from previous day: +633 01/09 0701 - 01/10 0700 In: 633.8 [P.O.:240; I.V.:393.8] Out: -  Intake/Output this shift:    PE: General: alert and oriented, pleasant affect.  Heart:S1S2 RRR Lungs:few wheezes and rhonchi Abd:+ BS soft, non tender Ext:Lt foot wrapped.      Lab Results:  [facility] 12/07/12 0610  WBC 10.2  HGB 10.1*  HCT 33.0*  PLT 243   BMET  Basename 12/07/12 0610  NA 135  K 4.6  CL 100  CO2 23  GLUCOSE 209*  BUN 14  CREATININE 0.88  CALCIUM 9.8    Lab Results  Component Value Date   HGBA1C 6.7* 08/05/2009     Lab Results  Component Value Date   TSH 1.08 05/24/2012      Studies/Results:  PV angiogram  12/06/12 IMPRESSION:Ms. Kasler has critical limb ischemia with nonhealing painful ulcer left foot, short segment calcified occlusion of her mid left SFA with one vessel runoff via the peroneal. She'll need to have this fixed percutaneously by antegrade access of the left SFA and diamondback orbital rotational arthrectomy, PTA and/or stenting.   Medications: I have reviewed the patient's current medications.    Marland Kitchen aspirin EC  325 mg Oral Daily  . famotidine  20 mg Oral Daily  . glyBURIDE  2.5 mg Oral Q breakfast   And  . metFORMIN  500 mg Oral Q breakfast  . hydrALAZINE  10 mg Intravenous UD  . irbesartan  150 mg Oral QHS   And  . hydrochlorothiazide  12.5 mg Oral QHS  . mometasone-formoterol  2 puff  Inhalation BID  . pantoprazole  80 mg Oral Q1200  . predniSONE  5 mg Oral Q M,W,F  . theophylline  200 mg Oral BID   Assessment/Plan: Principal Problem:  *Critical lower limb ischemia, with ulcer Lt foot Active Problems:  Non-healing ulcer of foot, secondary to diabetes and PVD  DIABETES-TYPE 2  HYPERTENSION  COPD  GERD  PVD (peripheral vascular disease), abnormal ABIs, need PCI to occlusion of mid LSFA to be scheduled  Tobacco abuse  PLAN: Have asked wound care, PT/OT to see the pt.  Added pain medication.  She is tostay in hosp for pain control and is for PCI at 0700 AM Monday.   Continue wound care with [facility], Podiatrist center has done xrays of foot, no osteomylitis.   She will return to them for further management once her PCI is complete. Discussed stopping tobacco. She will try e cigarettes.     LOS: 1 day   INGOLD,LAURA R 12/07/2012, 8:48 AM   Continues to have resting limb pain, but insists on going home. Preadmit for iv hydration on Sunday night for lower extremity PTA on Monday. Thurmon Fair, MD, [facility] [phone] office [phone] pager 12/07/2012 .

## 2012-12-07 NOTE — Progress Notes (Signed)
Inpatient Diabetes Program Recommendations  AACE/ADA: New Consensus Statement on Inpatient Glycemic Control (2013)  Target Ranges:  Prepandial:   less than 140 mg/dL      Peak postprandial:   less than 180 mg/dL (1-2 hours)      Critically ill patients:  140 - 180 mg/dL   Inpatient Diabetes Program Recommendations Correction (SSI): Start Novolog moderate scale TID Thank you  Piedad Climes BSN, RN,CDE Inpatient Diabetes Coordinator [phone] (team pager)

## 2012-12-07 NOTE — Care Management Note (Signed)
   CARE MANAGEMENT NOTE 12/07/2012  Patient:  ETHERINE, MACKOWIAK   Account Number:  [id number]  Date Initiated:  12/07/2012  Documentation initiated by:  Mckaylee Dimalanta  Subjective/Objective Assessment:   Notified by PT that pt will need rolling walker for home use.     Action/Plan:   Walker ordered as per pt she currently tries to use an oversized 4 wheel walker borrowed from someone. Order sent to [facility] for delivery to room as pt will d/c today. No HHPT needs at this time.   Anticipated DC Date:  12/07/2012   Anticipated DC Plan:  HOME/SELF CARE         Choice offered to / List presented to:     DME arranged  Levan Hurst      DME agency  [facility]        Status of service:  Completed, signed off Medicare Important Message given?   (If response is "NO", the following Medicare IM given date fields will be blank) Date Medicare IM given:   Date Additional Medicare IM given:    Discharge Disposition:  HOME/SELF CARE  Per UR Regulation:    If discussed at Long Length of Stay Meetings, dates discussed:    Comments:

## 2012-12-09 ENCOUNTER — Encounter (HOSPITAL_COMMUNITY): Payer: Self-pay | Admitting: Cardiology

## 2012-12-09 ENCOUNTER — Observation Stay (HOSPITAL_COMMUNITY)
Admission: AD | Admit: 2012-12-09 | Discharge: 2012-12-12 | DRG: 253 | Disposition: A | Discharge status: Home Health | Payer: Medicare Other | Source: Ambulatory Visit | Attending: Cardiovascular Disease | Admitting: Cardiovascular Disease

## 2012-12-09 DIAGNOSIS — K2270 Barrett's esophagus without dysplasia: Secondary | ICD-10-CM | POA: Insufficient documentation

## 2012-12-09 DIAGNOSIS — J4489 Other specified chronic obstructive pulmonary disease: Secondary | ICD-10-CM | POA: Insufficient documentation

## 2012-12-09 DIAGNOSIS — E669 Obesity, unspecified: Secondary | ICD-10-CM | POA: Diagnosis present

## 2012-12-09 DIAGNOSIS — J449 Chronic obstructive pulmonary disease, unspecified: Secondary | ICD-10-CM | POA: Insufficient documentation

## 2012-12-09 DIAGNOSIS — D899 Disorder involving the immune mechanism, unspecified: Secondary | ICD-10-CM | POA: Insufficient documentation

## 2012-12-09 DIAGNOSIS — Z79899 Other long term (current) drug therapy: Secondary | ICD-10-CM | POA: Insufficient documentation

## 2012-12-09 DIAGNOSIS — I10 Essential (primary) hypertension: Secondary | ICD-10-CM | POA: Insufficient documentation

## 2012-12-09 DIAGNOSIS — Z720 Tobacco use: Secondary | ICD-10-CM | POA: Diagnosis present

## 2012-12-09 DIAGNOSIS — IMO0002 Reserved for concepts with insufficient information to code with codable children: Secondary | ICD-10-CM | POA: Insufficient documentation

## 2012-12-09 DIAGNOSIS — IMO0001 Reserved for inherently not codable concepts without codable children: Secondary | ICD-10-CM

## 2012-12-09 DIAGNOSIS — E785 Hyperlipidemia, unspecified: Secondary | ICD-10-CM | POA: Insufficient documentation

## 2012-12-09 DIAGNOSIS — L97509 Non-pressure chronic ulcer of other part of unspecified foot with unspecified severity: Secondary | ICD-10-CM | POA: Insufficient documentation

## 2012-12-09 DIAGNOSIS — L409 Psoriasis, unspecified: Secondary | ICD-10-CM | POA: Diagnosis present

## 2012-12-09 DIAGNOSIS — D62 Acute posthemorrhagic anemia: Secondary | ICD-10-CM | POA: Diagnosis present

## 2012-12-09 DIAGNOSIS — E663 Overweight: Secondary | ICD-10-CM | POA: Insufficient documentation

## 2012-12-09 DIAGNOSIS — I998 Other disorder of circulatory system: Secondary | ICD-10-CM | POA: Diagnosis present

## 2012-12-09 DIAGNOSIS — M459 Ankylosing spondylitis of unspecified sites in spine: Secondary | ICD-10-CM | POA: Insufficient documentation

## 2012-12-09 DIAGNOSIS — L98499 Non-pressure chronic ulcer of skin of other sites with unspecified severity: Principal | ICD-10-CM | POA: Insufficient documentation

## 2012-12-09 DIAGNOSIS — Y838 Other surgical procedures as the cause of abnormal reaction of the patient, or of later complication, without mention of misadventure at the time of the procedure: Secondary | ICD-10-CM | POA: Insufficient documentation

## 2012-12-09 DIAGNOSIS — E119 Type 2 diabetes mellitus without complications: Secondary | ICD-10-CM | POA: Insufficient documentation

## 2012-12-09 DIAGNOSIS — I739 Peripheral vascular disease, unspecified: Principal | ICD-10-CM | POA: Insufficient documentation

## 2012-12-09 DIAGNOSIS — H409 Unspecified glaucoma: Secondary | ICD-10-CM | POA: Insufficient documentation

## 2012-12-09 LAB — GLUCOSE, CAPILLARY: Glucose-Capillary: 232 mg/dL — ABNORMAL HIGH (ref 70–99)

## 2012-12-09 MED ORDER — MOMETASONE FURO-FORMOTEROL FUM 100-5 MCG/ACT IN AERO
2.0000 | INHALATION_SPRAY | Freq: Two times a day (BID) | RESPIRATORY_TRACT | Status: DC
  Administered 2012-12-09 – 2012-12-12 (×5): 2 via RESPIRATORY_TRACT
  Filled 2012-12-09 (×2): qty 8.8

## 2012-12-09 MED ORDER — SODIUM CHLORIDE 0.9 % IJ SOLN: 3.0000 mL | Freq: Two times a day (BID) | INTRAMUSCULAR | Status: DC

## 2012-12-09 MED ORDER — THEOPHYLLINE ER 200 MG PO TB12
200.0000 mg | ORAL_TABLET | Freq: Two times a day (BID) | ORAL | Status: DC
  Administered 2012-12-09 – 2012-12-12 (×5): 200 mg via ORAL
  Filled 2012-12-09 (×8): qty 1

## 2012-12-09 MED ORDER — INSULIN ASPART 100 UNIT/ML ~~LOC~~ SOLN
0.0000 [IU] | Freq: Every day | SUBCUTANEOUS | Status: DC
  Administered 2012-12-09 – 2012-12-11 (×2): 2 [IU] via SUBCUTANEOUS

## 2012-12-09 MED ORDER — COLLAGENASE 250 UNIT/GM EX OINT
TOPICAL_OINTMENT | Freq: Every day | CUTANEOUS | Status: DC
  Filled 2012-12-09 (×2): qty 30

## 2012-12-09 MED ORDER — INSULIN ASPART 100 UNIT/ML ~~LOC~~ SOLN
0.0000 [IU] | Freq: Three times a day (TID) | SUBCUTANEOUS | Status: DC
  Administered 2012-12-10: 15:00:00 3 [IU] via SUBCUTANEOUS
  Administered 2012-12-10 – 2012-12-11 (×2): 8 [IU] via SUBCUTANEOUS
  Administered 2012-12-11 – 2012-12-12 (×3): 5 [IU] via SUBCUTANEOUS

## 2012-12-09 MED ORDER — ZOLPIDEM TARTRATE 5 MG PO TABS: 5.0000 mg | ORAL_TABLET | Freq: Every evening | ORAL | Status: DC | PRN

## 2012-12-09 MED ORDER — HYDROCODONE-ACETAMINOPHEN 5-325 MG PO TABS
1.0000 | ORAL_TABLET | ORAL | Status: DC | PRN
  Administered 2012-12-09 – 2012-12-12 (×12): 1 via ORAL
  Filled 2012-12-09 (×12): qty 1

## 2012-12-09 MED ORDER — ALBUTEROL SULFATE HFA 108 (90 BASE) MCG/ACT IN AERS
1.0000 | INHALATION_SPRAY | Freq: Four times a day (QID) | RESPIRATORY_TRACT | Status: DC | PRN
  Filled 2012-12-09: qty 6.7

## 2012-12-09 MED ORDER — HYDROCHLOROTHIAZIDE 12.5 MG PO CAPS
12.5000 mg | ORAL_CAPSULE | Freq: Two times a day (BID) | ORAL | Status: DC
  Administered 2012-12-09 – 2012-12-12 (×6): 12.5 mg via ORAL
  Filled 2012-12-09 (×8): qty 1

## 2012-12-09 MED ORDER — AMMONIUM LACTATE 5 % EX LOTN
1.0000 "application " | TOPICAL_LOTION | Freq: Every day | CUTANEOUS | Status: DC | PRN
  Filled 2012-12-09: qty 222

## 2012-12-09 MED ORDER — ONDANSETRON HCL 4 MG/2ML IJ SOLN: 4.0000 mg | Freq: Four times a day (QID) | INTRAMUSCULAR | Status: DC | PRN

## 2012-12-09 MED ORDER — OLMESARTAN MEDOXOMIL-HCTZ 40-12.5 MG PO TABS: 1.0000 | ORAL_TABLET | Freq: Every morning | ORAL | Status: DC

## 2012-12-09 MED ORDER — ASPIRIN EC 81 MG PO TBEC
81.0000 mg | DELAYED_RELEASE_TABLET | Freq: Every day | ORAL | Status: DC
  Filled 2012-12-09: qty 1

## 2012-12-09 MED ORDER — CLOBETASOL PROPIONATE 0.05 % EX LIQD: 1.0000 | Freq: Two times a day (BID) | CUTANEOUS | Status: DC | PRN

## 2012-12-09 MED ORDER — SODIUM CHLORIDE 0.9 % IJ SOLN: 3.0000 mL | INTRAMUSCULAR | Status: DC | PRN

## 2012-12-09 MED ORDER — ACETAMINOPHEN 325 MG PO TABS: 650.0000 mg | ORAL_TABLET | ORAL | Status: DC | PRN

## 2012-12-09 MED ORDER — TRAVOPROST (BAK FREE) 0.004 % OP SOLN
1.0000 [drp] | Freq: Every day | OPHTHALMIC | Status: DC
  Administered 2012-12-09 – 2012-12-11 (×3): 1 [drp] via OPHTHALMIC
  Filled 2012-12-09 (×2): qty 2.5

## 2012-12-09 MED ORDER — PANTOPRAZOLE SODIUM 40 MG PO TBEC
40.0000 mg | DELAYED_RELEASE_TABLET | Freq: Every day | ORAL | Status: DC
  Administered 2012-12-11 – 2012-12-12 (×2): 40 mg via ORAL
  Filled 2012-12-09 (×3): qty 1

## 2012-12-09 MED ORDER — OCUVITE-LUTEIN PO CAPS
1.0000 | ORAL_CAPSULE | Freq: Every day | ORAL | Status: DC
  Administered 2012-12-09 – 2012-12-12 (×3): 1 via ORAL
  Filled 2012-12-09 (×5): qty 1

## 2012-12-09 MED ORDER — AMMONIUM LACTATE 12 % EX LOTN
1.0000 "application " | TOPICAL_LOTION | Freq: Every day | CUTANEOUS | Status: DC | PRN
  Filled 2012-12-09: qty 400

## 2012-12-09 MED ORDER — SODIUM CHLORIDE 0.9 % IV SOLN: 250.0000 mL | INTRAVENOUS | Status: DC | PRN

## 2012-12-09 MED ORDER — SODIUM CHLORIDE 0.9 % IV SOLN
1.0000 mL/kg/h | INTRAVENOUS | Status: DC
  Administered 2012-12-09: 1 mL/kg/h via INTRAVENOUS

## 2012-12-09 MED ORDER — SIMETHICONE 80 MG PO CHEW
80.0000 mg | CHEWABLE_TABLET | Freq: Four times a day (QID) | ORAL | Status: DC | PRN
  Filled 2012-12-09: qty 1

## 2012-12-09 MED ORDER — DIAZEPAM 5 MG PO TABS
5.0000 mg | ORAL_TABLET | ORAL | Status: AC
  Administered 2012-12-10: 5 mg via ORAL
  Filled 2012-12-09: qty 1

## 2012-12-09 MED ORDER — FLUTICASONE PROPIONATE 50 MCG/ACT NA SUSP
2.0000 | Freq: Every day | NASAL | Status: DC | PRN
  Filled 2012-12-09: qty 16

## 2012-12-09 MED ORDER — AMMONIUM LACTATE 5 % EX LOTN
1.0000 "application " | TOPICAL_LOTION | Freq: Two times a day (BID) | CUTANEOUS | Status: DC
  Filled 2012-12-09 (×7): qty 1

## 2012-12-09 MED ORDER — PREDNISONE 5 MG PO TABS
5.0000 mg | ORAL_TABLET | Freq: Every day | ORAL | Status: DC
  Administered 2012-12-10 – 2012-12-12 (×3): 5 mg via ORAL
  Filled 2012-12-09 (×4): qty 1

## 2012-12-09 MED ORDER — OLMESARTAN MEDOXOMIL 20 MG PO TABS
20.0000 mg | ORAL_TABLET | Freq: Every day | ORAL | Status: DC
  Administered 2012-12-09 – 2012-12-11 (×3): 20 mg via ORAL
  Filled 2012-12-09 (×5): qty 1

## 2012-12-09 MED ORDER — OLMESARTAN MEDOXOMIL-HCTZ 20-12.5 MG PO TABS: 1.0000 | ORAL_TABLET | Freq: Every day | ORAL | Status: DC

## 2012-12-09 MED ORDER — OLMESARTAN MEDOXOMIL 40 MG PO TABS
40.0000 mg | ORAL_TABLET | Freq: Every day | ORAL | Status: DC
  Administered 2012-12-11 – 2012-12-12 (×2): 40 mg via ORAL
  Filled 2012-12-09 (×4): qty 1

## 2012-12-09 MED ORDER — EYE VITAMINS PO CAPS
1.0000 | ORAL_CAPSULE | Freq: Every day | ORAL | Status: DC
Start: 2012-12-09 — End: 2012-12-09

## 2012-12-09 MED ORDER — ALPRAZOLAM 0.25 MG PO TABS: 0.2500 mg | ORAL_TABLET | Freq: Three times a day (TID) | ORAL | Status: DC | PRN

## 2012-12-09 NOTE — Progress Notes (Signed)
Received from home. Assessed per flowsheet. Denies chest pain or shortness of breath. C/o chronic left leg PVD pain; will medicate. IVs inserted per order. Consents obtain. NPO status explained after midnight; verbalized understanding. VSS. Call bell and family near. Mamie Levers

## 2012-12-10 ENCOUNTER — Observation Stay (HOSPITAL_COMMUNITY): Admit: 2012-12-10 | Payer: Self-pay | Admitting: Cardiovascular Disease

## 2012-12-10 ENCOUNTER — Encounter (HOSPITAL_COMMUNITY)
Admission: AD | Disposition: A | Discharge status: Home Health | Payer: Self-pay | Source: Ambulatory Visit | Attending: Cardiovascular Disease

## 2012-12-10 HISTORY — PX: ATHERECTOMY: SHX5502

## 2012-12-10 HISTORY — PX: ANGIOPLASTY: SHX39

## 2012-12-10 LAB — GLUCOSE, CAPILLARY
Glucose-Capillary: 187 mg/dL — ABNORMAL HIGH (ref 70–99)
Glucose-Capillary: 191 mg/dL — ABNORMAL HIGH (ref 70–99)
Glucose-Capillary: 262 mg/dL — ABNORMAL HIGH (ref 70–99)
Glucose-Capillary: 184 mg/dL — ABNORMAL HIGH (ref 70–99)

## 2012-12-10 LAB — CBC
HCT: 29.5 % — ABNORMAL LOW (ref 36.0–46.0)
Hemoglobin: 9.4 g/dL — ABNORMAL LOW (ref 12.0–15.0)
MCH: 27.6 pg (ref 26.0–34.0)
MCHC: 31.9 g/dL (ref 30.0–36.0)
MCV: 86.8 fL (ref 78.0–100.0)
Platelets: 238 10*3/uL (ref 150–400)
RBC: 3.4 MIL/uL — ABNORMAL LOW (ref 3.87–5.11)
RDW: 16.3 % — ABNORMAL HIGH (ref 11.5–15.5)
WBC: 9.6 10*3/uL (ref 4.0–10.5)

## 2012-12-10 LAB — PROTIME-INR
INR: 1.19 (ref 0.00–1.49)
Prothrombin Time: 14.9 seconds (ref 11.6–15.2)

## 2012-12-10 LAB — POCT ACTIVATED CLOTTING TIME
Activated Clotting Time: 214 seconds
Activated Clotting Time: 165 seconds

## 2012-12-10 LAB — BASIC METABOLIC PANEL
BUN: 19 mg/dL (ref 6–23)
CO2: 21 mEq/L (ref 19–32)
Calcium: 9.5 mg/dL (ref 8.4–10.5)
Chloride: 98 mEq/L (ref 96–112)
Creatinine, Ser: 0.97 mg/dL (ref 0.50–1.10)
GFR calc Af Amer: 66 mL/min — ABNORMAL LOW (ref >90)
GFR calc non Af Amer: 57 mL/min — ABNORMAL LOW (ref >90)
Glucose, Bld: 221 mg/dL — ABNORMAL HIGH (ref 70–99)
Potassium: 4.6 mEq/L (ref 3.5–5.1)
Sodium: 131 mEq/L — ABNORMAL LOW (ref 135–145)

## 2012-12-10 SURGERY — ATHERECTOMY
Anesthesia: LOCAL

## 2012-12-10 MED ORDER — SODIUM CHLORIDE 0.9 % IV SOLN
INTRAVENOUS | Status: AC
  Administered 2012-12-10: 12:00:00 via INTRAVENOUS

## 2012-12-10 MED ORDER — LIDOCAINE HCL (PF) 1 % IJ SOLN
INTRAMUSCULAR | Status: AC
  Filled 2012-12-10: qty 30

## 2012-12-10 MED ORDER — VERAPAMIL HCL 2.5 MG/ML IV SOLN
INTRAVENOUS | Status: AC
  Filled 2012-12-10: qty 2

## 2012-12-10 MED ORDER — CLOPIDOGREL BISULFATE 300 MG PO TABS
ORAL_TABLET | ORAL | Status: AC
  Filled 2012-12-10: qty 1

## 2012-12-10 MED ORDER — NITROGLYCERIN IN D5W 200-5 MCG/ML-% IV SOLN
INTRAVENOUS | Status: AC
  Filled 2012-12-10: qty 250

## 2012-12-10 MED ORDER — MIDAZOLAM HCL 2 MG/2ML IJ SOLN
INTRAMUSCULAR | Status: AC
  Filled 2012-12-10: qty 2

## 2012-12-10 MED ORDER — ACETAMINOPHEN 325 MG PO TABS: 650.0000 mg | ORAL_TABLET | ORAL | Status: DC | PRN

## 2012-12-10 MED ORDER — HEPARIN (PORCINE) IN NACL 2-0.9 UNIT/ML-% IJ SOLN
INTRAMUSCULAR | Status: AC
  Filled 2012-12-10: qty 1000

## 2012-12-10 MED ORDER — CLOPIDOGREL BISULFATE 300 MG PO TABS
300.0000 mg | ORAL_TABLET | Freq: Once | ORAL | Status: DC
  Filled 2012-12-10: qty 1

## 2012-12-10 MED ORDER — FENTANYL CITRATE 0.05 MG/ML IJ SOLN
INTRAMUSCULAR | Status: AC
  Filled 2012-12-10: qty 2

## 2012-12-10 MED ORDER — ONDANSETRON HCL 4 MG/2ML IJ SOLN: 4.0000 mg | Freq: Four times a day (QID) | INTRAMUSCULAR | Status: DC | PRN

## 2012-12-10 MED ORDER — ASPIRIN EC 325 MG PO TBEC
325.0000 mg | DELAYED_RELEASE_TABLET | Freq: Every day | ORAL | Status: DC
  Administered 2012-12-10: 17:00:00 325 mg via ORAL
  Filled 2012-12-10 (×3): qty 1

## 2012-12-10 MED ORDER — MORPHINE SULFATE 2 MG/ML IJ SOLN
INTRAMUSCULAR | Status: AC
  Filled 2012-12-10: qty 1

## 2012-12-10 MED ORDER — HEPARIN SODIUM (PORCINE) 1000 UNIT/ML IJ SOLN
INTRAMUSCULAR | Status: AC
  Filled 2012-12-10: qty 1

## 2012-12-10 MED ORDER — MORPHINE SULFATE 2 MG/ML IJ SOLN
2.0000 mg | INTRAMUSCULAR | Status: DC | PRN
  Administered 2012-12-10 – 2012-12-11 (×3): 2 mg via INTRAVENOUS
  Filled 2012-12-10 (×3): qty 1

## 2012-12-10 NOTE — CV Procedure (Signed)
[name] is a 72 y.o. female    [id number] LOCATION:  FACILITY: [facility]  PHYSICIAN: Nanetta Batty, M.D. 10-Aug-1941   DATE OF PROCEDURE:  12/10/2012  DATE OF DISCHARGE:  [facility]  PV Intervention    History obtained from chart review.Ms. Brede is a 72 year old mildly overweight widowed African American female no children is retired Engineer, civil (consulting) from [employer]. She lives with her sister. She took was referred to me by Dr. Fanny Dance for peripheral vascular evaluation because of painful left foot with nonhealing ulcers.  Her cardiac risk factor profile is positive for 25 pack years of tobacco abuse currently smoking one half pack per day. She is A. Treated diabetic, hypertensive and hyperlipidemic intolerant to statin drugs. She does have COPD with dyspnea on exertion with a gets occasional chest heaviness that she had a Myoview in our office that was normal. The patient does have Barrett's esophagus and GERD. She has complained of left foot pain with left calf claudication the last month and was referred for Dopplers that showed a left ABI of 0.35. She presents now for angiography and potential percutaneous intervention for critical limb ischemia. She was discharged home and returned last night for hydration. She presents now for percutaneous acquisition of short segment calcified CTL of her left SFA in the setting of critical limb ischemia for diamondback orbital rotational atherectomy, PTA and stenting.    PROCEDURE DESCRIPTION:    The patient was brought to the second floor  [facility] lab in the postabsorptive state. She was  premedicated with Valium 5 mg by mouth, IV Versed and fentanyl. Her left groinwas prepped and shaved in usual sterile fashion. Xylocaine 1% was used for local anesthesia. A 6 French sheath was inserted into the left SFA artery using standard Seldinger technique an antegrade access.. The patient received  7000 units  of  heparin  intravenously.  Total contrast administered during the case was 150 cc. The ending ACT was 225.   Using an 035 short angled that across endhole catheter along with a 35 Glidewire the mid left SFA was crossed. Following this the Glidewire was removed an 8014 guidewire was exchanged and placed in the above the knee popliteal artery. A large NAV 6 filter was then deployed over the fiber wire for distal protection. Following this or blepharectomy was performed with a  1.5 mm bur 120,000 rpm's with a total of 6 runs. Generous amount of intra-articular fluid was administered. Angiography revealed a markedly improved result. Dilatation was then performed with a 6 x 100 mm millimeter balloon followed by a 6 x 2 cm noncompliant balloon. Following this a 5x 100 IDEV self-expanding stent was then deployed under fluoroscopic control and postdilated with a 6 x 100 balloon resulting in reduction of a total occlusion to 0% residual excellent flow. Angiography below the knee revealed a widely patent peroneal with a hot highly diseased but patent anterior tibial.    HEMODYNAMICS:    AO SYSTOLIC/AO DIASTOLIC: 124/62    IMPRESSION: Successful diamondback orbital rotational atherectomy, PTA and stenting using an IDEV Expanding Stent in the Setting of a Left SFA Calcified CTL Critical Limb Ischemia. The Sheath Will Be Removed Once It 170 pressure will be held on the groin to achieve hemostasis. Patient be hydrated overnight, and treated with aspirin Plavix (300 mg of Plavix in the cath lab). The patient patient left the lab in stable condition.  Runell Gess MD, [facility] 12/10/2012 9:56 AM

## 2012-12-10 NOTE — H&P (Signed)
  H & P will be scanned in.  Pt was reexamined and existing H & P reviewed. No changes found.  Runell Gess, MD [facility] 12/10/2012 7:36 AM

## 2012-12-11 ENCOUNTER — Other Ambulatory Visit (HOSPITAL_COMMUNITY): Payer: Self-pay | Admitting: Cardiology

## 2012-12-11 DIAGNOSIS — H409 Unspecified glaucoma: Secondary | ICD-10-CM | POA: Diagnosis present

## 2012-12-11 DIAGNOSIS — K2270 Barrett's esophagus without dysplasia: Secondary | ICD-10-CM | POA: Diagnosis present

## 2012-12-11 DIAGNOSIS — E669 Obesity, unspecified: Secondary | ICD-10-CM | POA: Diagnosis present

## 2012-12-11 DIAGNOSIS — I739 Peripheral vascular disease, unspecified: Secondary | ICD-10-CM

## 2012-12-11 DIAGNOSIS — IMO0001 Reserved for inherently not codable concepts without codable children: Secondary | ICD-10-CM

## 2012-12-11 LAB — BASIC METABOLIC PANEL
Calcium: 9.1 mg/dL (ref 8.4–10.5)
Chloride: 100 mEq/L (ref 96–112)
Creatinine, Ser: 0.86 mg/dL (ref 0.50–1.10)
GFR calc Af Amer: 76 mL/min — ABNORMAL LOW (ref >90)
GFR calc non Af Amer: 66 mL/min — ABNORMAL LOW (ref >90)

## 2012-12-11 LAB — HEMOGLOBIN AND HEMATOCRIT, BLOOD
HCT: 25.8 % — ABNORMAL LOW (ref 36.0–46.0)
Hemoglobin: 8.2 g/dL — ABNORMAL LOW (ref 12.0–15.0)

## 2012-12-11 LAB — CBC
HCT: 24.3 % — ABNORMAL LOW (ref 36.0–46.0)
MCHC: 31.7 g/dL (ref 30.0–36.0)
Platelets: 203 10*3/uL (ref 150–400)
RDW: 16.4 % — ABNORMAL HIGH (ref 11.5–15.5)
WBC: 9.9 10*3/uL (ref 4.0–10.5)

## 2012-12-11 LAB — GLUCOSE, CAPILLARY
Glucose-Capillary: 191 mg/dL — ABNORMAL HIGH (ref 70–99)
Glucose-Capillary: 226 mg/dL — ABNORMAL HIGH (ref 70–99)
Glucose-Capillary: 286 mg/dL — ABNORMAL HIGH (ref 70–99)
Glucose-Capillary: 256 mg/dL — ABNORMAL HIGH (ref 70–99)

## 2012-12-11 MED ORDER — GLYBURIDE-METFORMIN 2.5-500 MG PO TABS: 1.0000 | ORAL_TABLET | Freq: Every day | ORAL | Status: DC

## 2012-12-11 MED ORDER — ACETAMINOPHEN 325 MG PO TABS: 650.0000 mg | ORAL_TABLET | ORAL | Status: DC | PRN

## 2012-12-11 MED ORDER — ASPIRIN EC 81 MG PO TBEC
81.0000 mg | DELAYED_RELEASE_TABLET | Freq: Every day | ORAL | Status: DC
  Administered 2012-12-12: 81 mg via ORAL
  Filled 2012-12-11: qty 1

## 2012-12-11 MED ORDER — CLOPIDOGREL BISULFATE 75 MG PO TABS
75.0000 mg | ORAL_TABLET | Freq: Every day | ORAL | Status: DC
  Administered 2012-12-11 – 2012-12-12 (×2): 75 mg via ORAL
  Filled 2012-12-11 (×2): qty 1

## 2012-12-11 MED ORDER — CLOPIDOGREL BISULFATE 300 MG PO TABS: 300.0000 mg | ORAL_TABLET | Freq: Once | ORAL | Status: DC

## 2012-12-11 NOTE — Consult Note (Signed)
WOC consult Note Reason for Consult: pt to be reevaluated for wound care now post vascular intervention. She is complaining of more pain today and more swelling. The foot does not appear much more swollen than at the time of my last exam. Of note is that fact that the great toe does appear a bit darker and the dark area of the 2nd toe does appear darker. The lateral heel is unchanged, however she does have a bit more erythema  surrounding this area and a little fluctuance of the periwound.The lateral and medial open wounds are very, very dry and it sounds like they have not had dressing changes or application for several days which would explain the dryness of the wound beds.   Wound type:Arterial ulcerations x 5 of the left foot Wound bed:see above  Drainage (amount, consistency, odor) none Periwound:see above Dressing procedure/placement/frequency: will implement medihoney for antimicrobial and to add moisture to facilitate debridement of the two open wounds.  Betadine for the toe tips and lateral heel to keep stable if possible. Off load the heel as much as possible.  WOC will follow along with you for wound management as needed. Dimetri Armitage [place] RN,CWOCN [phone]

## 2012-12-11 NOTE — Progress Notes (Signed)
Repeat H/H 8.2. Will hold off on transfusion and re check in am.  Corine Shelter PA-C 12/11/2012 1:12 PM

## 2012-12-11 NOTE — Progress Notes (Signed)
INITIAL NUTRITION ASSESSMENT  DOCUMENTATION CODES Per approved criteria  -Obesity Unspecified   INTERVENTION: Continue current diet Continue to attempt reinforcement of diet education  NUTRITION DIAGNOSIS: Altered nutrition-related lab value related to diabetes as evidenced by elevated cbg's.   Goal: Blood glucose < 180  Monitor:  labs, PO intake  Reason for Assessment: Malnutrition Screening Tool  72 y.o. female  Admitting Dx: Critical lower limb ischemia  ASSESSMENT: Pt reports she has lost a little weight over the last 1 1/2 years due to healthy diet changes for her diabetes. She reports that she does not take her diabetes medication as ordered because she does not want to have a low at night. Reinforced importance of taking her medications.  Pt declines DM diet education at this time.  Pt is s/p diamondback orbital rotational atherectomy for non-healing ulcers. Pt with boot on her foot and is followed by WOC.    Height: Ht Readings from Last 1 Encounters:  12/09/12 5\' 4"  (1.626 m)    Weight: Wt Readings from Last 1 Encounters:  12/11/12 177 lb 14.6 oz (80.7 kg)    Ideal Body Weight: 54.5 kg  % Ideal Body Weight: 148%  Wt Readings from Last 10 Encounters:  12/11/12 177 lb 14.6 oz (80.7 kg)  12/11/12 177 lb 14.6 oz (80.7 kg)  12/07/12 171 lb 15.3 oz (78 kg)  12/07/12 171 lb 15.3 oz (78 kg)  12/05/12 175 lb (79.379 kg)  05/24/12 179 lb (81.194 kg)  04/19/12 177 lb 9.6 oz (80.559 kg)  03/15/12 180 lb 3.2 oz (81.738 kg)  02/21/12 179 lb 3.2 oz (81.285 kg)  11/01/11 186 lb (84.369 kg)    Usual Body Weight: -  % Usual Body Weight: -  BMI:  Body mass index is 30.54 kg/(m^2).  Estimated Nutritional Needs: Kcal: 1600-1800 Protein: 80-90 grams Fluid: > 1.6 L/day  Skin: non-healing ulcers  Diet Order: Carb Control Meal Completion: 100%  EDUCATION NEEDS: -Education needs addressed   Intake/Output Summary (Last 24 hours) at 12/11/12 1634 Last data  filed at 12/11/12 0544  Gross per 24 hour  Intake      0 ml  Output   1500 ml  Net  -1500 ml    Last BM: PTA   Labs:   Lab 12/11/12 0445 12/10/12 0615 12/07/12 0610  NA 134* 131* 135  K 4.9 4.6 4.6  CL 100 98 100  CO2 24 21 23   BUN 18 19 14   CREATININE 0.86 0.97 0.88  CALCIUM 9.1 9.5 9.8  MG -- -- --  PHOS -- -- --  GLUCOSE 237* 221* 209*    CBG (last 3)   Basename 12/11/12 1206 12/11/12 0800 12/10/12 2135  GLUCAP 226* 191* 184*   Scheduled Meds:   . aspirin EC  81 mg Oral Daily  . clopidogrel  300 mg Oral Once  . clopidogrel  75 mg Oral Q breakfast  . collagenase   Topical Daily  . hydrochlorothiazide  12.5 mg Oral BID  . insulin aspart  0-15 Units Subcutaneous TID WC  . insulin aspart  0-5 Units Subcutaneous QHS  . mometasone-formoterol  2 puff Inhalation BID  . multivitamin-lutein  1 capsule Oral Daily  . olmesartan  20 mg Oral QHS  . olmesartan  40 mg Oral Daily  . pantoprazole  40 mg Oral Daily  . predniSONE  5 mg Oral Daily  . theophylline  200 mg Oral BID  . Travoprost (BAK Free)  1 drop Both Eyes QHS  Continuous Infusions:   Past Medical History  Diagnosis Date  . COPD (chronic obstructive pulmonary disease)   . Hypertension   . Asthma   . Psoriatic arthritis   . Psoriasis   . GERD (gastroesophageal reflux disease)   . Hiatal hernia   . Chronic sinus infection   . Heart murmur   . Blood transfusion ~ 1962    "11 units of blood"  . Ankylosing spondylitis   . Chronic back pain   . Dysrhythmia     "palpitations"  . Esophageal dilatation   . Candida esophagitis   . Cirrhosis of liver without mention of alcohol   . Hypercholesteremia   . Peripheral vascular disease   . Exertional dyspnea   . Pneumonia 10/07/11; 2006; 1980's  . Chronic bronchitis     "since childhood" (12/06/2012)  . Type II diabetes mellitus   . Iron deficiency anemia   . External bleeding hemorrhoids   . Sinus headache   . Arthritis     "psorasic"  . OA  (osteoarthritis)   . DJD (degenerative joint disease) of knee     "both" (12/06/2012)  . Carpal tunnel syndrome on right   . Anxiety   . Barrett esophagus   . Non-healing ulcer of foot, secondary to diabetes and PVD 12/07/2012  . PVD (peripheral vascular disease) 12/07/2012  . Tobacco abuse 12/07/2012  . Critical lower limb ischemia, with ulcer Lt foot 12/07/2012    Past Surgical History  Procedure Date  . Nasal sinus surgery 09/29/2009  . Carpal tunnel release 2011    left hand  . Lipoma excision 2010    "left occipital area"  . Cataract extraction w/ intraocular lens implant 2009/2010    right/left  . Eye surgery   . Oophorectomy     'not sure which one"  . Appendectomy   . Angioplasty 12/06/2012    "LLE"  . Abdominal hysterectomy 1976  . Breast biopsy 1960's    "think he did both" (12/06/2012)    Kendell Bane RD, LDN, CNSC [phone] Pager [phone] After Hours Pager

## 2012-12-11 NOTE — Progress Notes (Signed)
Utilization Review Completed Shiori Adcox, RN, BSN 

## 2012-12-11 NOTE — Progress Notes (Signed)
Inpatient Diabetes Program Recommendations  AACE/ADA: New Consensus Statement on Inpatient Glycemic Control (2013)  Target Ranges:  Prepandial:   less than 140 mg/dL      Peak postprandial:   less than 180 mg/dL (1-2 hours)      Critically ill patients:  140 - 180 mg/dL   Reason for Visit: Results for [name], [name] (MRN [id number]) as of 12/11/2012 13:53  Ref. Range 12/10/2012 17:47 12/10/2012 21:35 12/11/2012 08:00 12/11/2012 12:06  Glucose-Capillary Latest Range: 70-99 mg/dL 409 (H) 811 (H) 914 (H) 226 (H)   Please consider adding Lantus 15 units daily.  Note CBG's greater than 180 mg/dL.  Also please order A1C to determine pre-hospitalization glycemic control.

## 2012-12-11 NOTE — Progress Notes (Signed)
Subjective:  C/O pain, swelling Lt foot.  Objective:  Vital Signs in the last 24 hours: Temp:  [98.3 F (36.8 C)-99.3 F (37.4 C)] 98.3 F (36.8 C) (01/14 0755) Pulse Rate:  [75-84] 82  (01/14 0755) Resp:  [13-25] 19  (01/14 0755) BP: (116-158)/(56-68) 158/60 mmHg (01/14 0755) SpO2:  [92 %-96 %] 94 % (01/14 0755) Weight:  [80.7 kg (177 lb 14.6 oz)] 80.7 kg (177 lb 14.6 oz) (01/14 0001)  Intake/Output from previous day:  Intake/Output Summary (Last 24 hours) at 12/11/12 1146 Last data filed at 12/11/12 0544  Gross per 24 hour  Intake      0 ml  Output   1500 ml  Net  -1500 ml    Physical Exam: General appearance: alert, cooperative, no distress and moderately obese Lungs: decreased breath sounds, no wheezing Heart: regular rate and rhythm Echymosis without hematoma both groins. Ulcer lt foot with black Lt great to and Lt heel ulcer. Skin: Lower extremity psoriasis   Rate: 82  Rhythm: normal sinus rhythm  Lab Results:  Basename 12/11/12 0445 12/10/12 0615  WBC 9.9 9.6  HGB 7.7* 9.4*  PLT 203 238    Basename 12/11/12 0445 12/10/12 0615  NA 134* 131*  K 4.9 4.6  CL 100 98  CO2 24 21  GLUCOSE 237* 221*  BUN 18 19  CREATININE 0.86 0.97   No results found for this basename: TROPONINI:2,CK,MB:2 in the last 72 hours Hepatic Function Panel No results found for this basename: PROT,ALBUMIN,AST,ALT,ALKPHOS,BILITOT,BILIDIR,IBILI in the last 72 hours No results found for this basename: CHOL in the last 72 hours  Basename 12/10/12 0615  INR 1.19    Imaging: Imaging results have been reviewed  Cardiac Studies:  Assessment/Plan:   Principal Problem:  *Critical lower limb ischemia, with ulcer Lt foot  Active Problems:  PVD, Lt SFA stenting 12/10/12  DIABETES-TYPE 2  COPD  HYPERTENSION  AS (ankylosing spondylitis)  Immune system disorder  Low risk Myoview with NL LVF as an OP    Plan- 72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 Work up and subsequent PVA on 12/06/12 revealed critical Lt leg ischemia. She was discharged and re admitted 12/09/12 for diamondback atherectomy which was done 12/10/12. Today she complains of pain and swelling in her Lt foot. Her HGb is down to 7.7, it was 10.1 on Jan 10th. Will re check H/H but she may need transfusion. [facility] consult ordered.  Corine Shelter PA-C 12/11/2012, 11:46 AM  I have seen and evaluated the patient this morning PM with Corine Shelter, PA. I agree with his findings, examination as well as impression recommendations.  S/p complex LLE PVA - stent via antegrade LSFA puncutre -- notable ecchymoses, but no hematoma.  + drop in H&H, but stable.   No obvious bleed & hemodynamically stable.  Will recheck Hgb & consider transfusion.  She showed me 2 ulcerated lesions on the top of her foot at the base of the great toe & the 5th toe -- dressing applied by [facility].   NO Fever or elevated WBC, will need to consider whether we start empiric Abx vs simply provide wound care & improved in-loine flow.  Will transfer to tele & monitor o/n.  D/w Dr. Allyson Sabal re Abx.  Groin is stable, monitor H/H in AM  - 1 unit drop expected.  HTN - continue home meds.  Ensure Clopidogrel dose ordered.  Marykay Lex, M.D., M.S. [facility] [address] [place], [place]  [address]  [phone] Pager # [phone] 12/11/2012 2:46 PM

## 2012-12-11 NOTE — Evaluation (Signed)
Physical Therapy Evaluation Patient Details Name: [name] MRN: [id number] DOB: 1941/04/22 Today's Date: 12/11/2012 Time: 4098-1191 PT Time Calculation (min): 25 min  PT Assessment / Plan / Recommendation Clinical Impression  Patient is a 72 y.o. s/p revascularization procedure on left LE due to critical llimb ischemia.  She presents with continued issues with mobility due to pain following surgery.  Will benefit from skilled PT in the acute setting and needs HHPT at discharge with sister and brother intermittent assist.    PT Assessment  Patient needs continued PT services    Follow Up Recommendations  Home health PT                Equipment Recommendations  None recommended by PT         Frequency Min 5X/week    Precautions / Restrictions Precautions Precautions: Fall Required Braces or Orthoses: Other Brace/Splint Other Brace/Splint: None ordered, but patient had a post-op shoe we used and seemed improved.   Pertinent Vitals/Pain 8/10 left foot      Mobility  Bed Mobility Bed Mobility: Sitting - Scoot to Edge of Bed;Supine to Sit Supine to Sit: 5: Supervision Sitting - Scoot to Edge of Bed: 5: Supervision Transfers Transfers: Sit to Stand;Stand to Sit Sit to Stand: 4: Min guard;From bed;With upper extremity assist;From toilet Stand to Sit: 4: Min guard;To bed;With upper extremity assist;To toilet Details for Transfer Assistance: supervision off toilet using grabbar and walker, needed min assist to safely get onto toliet due to not close enoughj Ambulation/Gait Ambulation/Gait Assistance: 4: Min assist Ambulation Distance (Feet): 10 Feet (x 2) Assistive device: Rolling walker Ambulation/Gait Assistance Details: severely antalgic on left; cues for limiting weight bearing on left Gait Pattern: Step-to pattern;Antalgic;Decreased step length - right;Decreased stance time - left              PT Diagnosis: Difficulty walking;Acute pain  PT Problem List:  Pain;Decreased mobility;Decreased balance;Decreased activity tolerance PT Treatment Interventions: DME instruction;Gait training;Stair training;Functional mobility training;Patient/family education;Therapeutic activities;Therapeutic exercise;Balance training   PT Goals Acute Rehab PT Goals PT Goal Formulation: With patient Time For Goal Achievement: 12/14/12 Potential to Achieve Goals: Good Pt will go Sit to Stand: with modified independence PT Goal: Sit to Stand - Progress: Goal set today Pt will go Stand to Sit: with modified independence PT Goal: Stand to Sit - Progress: Goal set today Pt will Ambulate: 51 - 150 feet;with rolling walker;with modified independence PT Goal: Ambulate - Progress: Goal set today Pt will Go Up / Down Stairs: 1-2 stairs;with supervision;with rolling walker (1 step) PT Goal: Up/Down Stairs - Progress: Goal set today  Visit Information  Last PT Received On: 12/11/12 Assistance Needed: +1    Subjective Data  Subjective: I just need a little more support on this foot to help me walk on it better. Patient Stated Goal: To go home tomorrow   Prior Functioning  Home Living Lives With: Family Available Help at Discharge: Available PRN/intermittently (sister in and out) Type of Home: House Home Access: Stairs to enter [employer] of Steps: 1 Entrance Stairs-Rails: None Home Layout: One level Firefighter: Standard Home Adaptive Equipment: Environmental consultant - four wheeled;Walker - rolling;Straight cane Prior Function Level of Independence: Independent with assistive device(s) Communication Communication: No difficulties    Cognition  Overall Cognitive Status: Appears within functional limits for tasks assessed/performed Arousal/Alertness: Awake/alert Orientation Level: Appears intact for tasks assessed Behavior During Session: [facility] for tasks performed    Extremity/Trunk Assessment Right Lower Extremity Assessment RLE  ROM/Strength/Tone: [facility] for  tasks assessed Left Lower Extremity Assessment LLE ROM/Strength/Tone: Deficits;Due to pain LLE ROM/Strength/Tone Deficits: limited tolerance to weight bearing   Balance    End of Session PT - End of Session Equipment Utilized During Treatment: Gait belt Activity Tolerance: Patient limited by pain Patient left: in bed;with call bell/phone within reach Nurse Communication: Mobility status  GP     [facility] 12/11/2012, 4:53 PM  Sheran Lawless, PT [phone] 12/11/2012

## 2012-12-12 LAB — CBC
HCT: 25.5 % — ABNORMAL LOW (ref 36.0–46.0)
Hemoglobin: 8 g/dL — ABNORMAL LOW (ref 12.0–15.0)
MCH: 27.3 pg (ref 26.0–34.0)
MCHC: 31.4 g/dL (ref 30.0–36.0)
MCV: 87 fL (ref 78.0–100.0)
Platelets: 238 10*3/uL (ref 150–400)
RBC: 2.93 MIL/uL — ABNORMAL LOW (ref 3.87–5.11)
RDW: 16.4 % — ABNORMAL HIGH (ref 11.5–15.5)
WBC: 10.9 10*3/uL — ABNORMAL HIGH (ref 4.0–10.5)

## 2012-12-12 LAB — BASIC METABOLIC PANEL
BUN: 18 mg/dL (ref 6–23)
CO2: 24 mEq/L (ref 19–32)
Calcium: 9.9 mg/dL (ref 8.4–10.5)
Chloride: 98 mEq/L (ref 96–112)
Creatinine, Ser: 0.98 mg/dL (ref 0.50–1.10)
GFR calc Af Amer: 65 mL/min — ABNORMAL LOW (ref >90)
GFR calc non Af Amer: 56 mL/min — ABNORMAL LOW (ref >90)
Glucose, Bld: 208 mg/dL — ABNORMAL HIGH (ref 70–99)
Potassium: 4.4 mEq/L (ref 3.5–5.1)
Sodium: 132 mEq/L — ABNORMAL LOW (ref 135–145)

## 2012-12-12 LAB — GLUCOSE, CAPILLARY
Glucose-Capillary: 204 mg/dL — ABNORMAL HIGH (ref 70–99)
Glucose-Capillary: 236 mg/dL — ABNORMAL HIGH (ref 70–99)

## 2012-12-12 MED ORDER — ACETAMINOPHEN 325 MG PO TABS: 650.0000 mg | ORAL_TABLET | ORAL | Status: DC | PRN

## 2012-12-12 MED ORDER — CLOPIDOGREL BISULFATE 75 MG PO TABS: 75.0000 mg | ORAL_TABLET | Freq: Every day | ORAL | Status: DC

## 2012-12-12 NOTE — Care Management Note (Signed)
    Page 1 of 1   12/12/2012     3:14:13 PM   CARE MANAGEMENT NOTE 12/12/2012  Patient:  [name], [name]   Account Number:  [id number]  Date Initiated:  12/12/2012  Documentation initiated by:  Kymoni Lesperance  Subjective/Objective Assessment:   PT S/P LT FOOT ARTHRECTOMY AND STENTING OF SFA.  PTA, PT INDEPENDENT, LIVES AT HOME ALONE.     Action/Plan:   PT NEEDS HH FOLLOW UP FOR WOUND CARE AND P.T. AT DC. MET WITH PT TO DISCUSS.  REFERRAL TO [facility], PER PT CHOICE.  START OF CARE 24-48H POST DC DATE.  PT HAS NO DME NEEDS.   Anticipated DC Date:  12/12/2012   Anticipated DC Plan:  HOME W HOME HEALTH SERVICES      DC Planning Services  CM consult      [facility] Choice  HOME HEALTH   Choice offered to / List presented to:  C-1 Patient        HH arranged  [name]  HH-2 PT      [facility] agency  [facility]   Status of service:  Completed, signed off Medicare Important Message given?   (If response is "NO", the following Medicare IM given date fields will be blank) Date Medicare IM given:   Date Additional Medicare IM given:    Discharge Disposition:  HOME W HOME HEALTH SERVICES  Per UR Regulation:  Reviewed for med. necessity/level of care/duration of stay  If discussed at Long Length of Stay Meetings, dates discussed:    Comments:

## 2012-12-12 NOTE — Progress Notes (Signed)
[facility]  Patient Status: New  [facility] is providing the following services: RN and PT  If patient discharges after hours, please call [phone].   [name] 12/12/2012, 12:06 PM

## 2012-12-12 NOTE — Progress Notes (Signed)
Inpatient Diabetes Program Recommendations  AACE/ADA: New Consensus Statement on Inpatient Glycemic Control (2013)  Target Ranges:  Prepandial:   less than 140 mg/dL      Peak postprandial:   less than 180 mg/dL (1-2 hours)      Critically ill patients:  140 - 180 mg/dL  Results for AITANNA, HAUBNER (MRN [id number]) as of 12/12/2012 13:15  Ref. Range 12/11/2012 12:06 12/11/2012 17:40 12/11/2012 20:58 12/12/2012 05:56 12/12/2012 11:24  Glucose-Capillary Latest Range: 70-99 mg/dL 213 (H) 086 (H) 578 (H) 204 (H) 236 (H)    Inpatient Diabetes Program Recommendations Insulin - Basal: start Lantus 10 units  Correction (SSI): . HgbA1C: order to assess prehospital glucose control Goal 140-180 Thank you  Piedad Climes BSN, RN,CDE Inpatient Diabetes Coordinator [phone] (team pager)

## 2012-12-12 NOTE — Discharge Summary (Signed)
Physician Discharge Summary  Patient ID: [name] MRN: [id number] DOB/AGE: [birth year] 72 y.o.  Admit date: 12/09/2012 Discharge date: 12/12/2012  Admission Diagnoses: Critical lower limb ischemia, with left food ulcer/ planned diamondback orbital rotational atherectomy, PTA and stenting of the left SFA  Discharge Diagnoses:  Principal Problem:  *Critical lower limb ischemia, with ulcer Lt foot- S/P atherectomy, PTA/stenting 12/10/12 Active Problems:  DIABETES-TYPE 2  HYPERTENSION  COPD  AS (ankylosing spondylitis)  Immune system disorder  PVD, Lt SFA stenting 12/10/12  Low risk Myoview with NL LVF as an OP  Barrett esophagus  Glaucoma  Obesity   Discharged Condition: stable  Hospital Course: The patient is a 72 y.o. AAF, with PVD who presented to [facility] with critical lower limb ischemia and a painful left food ulcer. She was  admitted on 12/09/12 to undergo diamondback orbital rotational atherectomy, PTA and stenting of the left SFA.  Her history is also significant for DM, HTN, HLD and 25 pack years of tobacco abuse. Prior to this admission, she underwent a PV angiogram on 12/06/12. The procedure was performed by Dr. Allyson Sabal. She was found to have a short segment calcified occlusion of the mid left SFA. There was one vessel runoff via the peroneal. The intervention was also done by Dr. Allyson Sabal. She underwent a successful diamondback orbital rotational atherectomy, PTA and stenting using an IDEV Expanding Stent. She left the cath lab in stable condition and was started on dual antiplatelet therapy with ASA and Plavix. On POD #1, she had a drop in her H/H. Her H/H was 7.7/24.3, but she was hemodynamically stable. Her labs were repeated several hours later and her H/H had improved to 8.2/25.8, negating the need for transfusion. On POD # 2, her H/H was stable, and her pain had also improved. WOC was  consulted and provided wound care throughout admission. The patient was last seen and examined by  Dr. Rennis Golden, who felt she was stable for discharge home. The patient was provided instructions by WOC on proper wound care management. The patient is also followed by a podiatrist and will return to them for further management. Follow-up at [facility] has been arranged and follow-up arterial dopplers have been ordered.     Consults: WOC  Significant Diagnostic Studies:  Angiography of LLE 12/06/12  HEMODYNAMICS:  AO SYSTOLIC/AO DIASTOLIC: 150/64  ANGIOGRAPHIC RESULTS:  1: Abdominal aortogram-renal artery is widely patent. The infrarenal abdominal aorta was free of significant atherosclerotic changes.  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 There is two-vessel runoff below the knee with an occluded posterior tibial artery just above the ankle that was collateralized by the peroneal.  3: There was a short segment calcified occlusion of the mid left SFA. There was one vessel runoff via the peroneal.  IMPRESSION:[name] has critical limb ischemia with nonhealing painful ulcer left foot, short segment calcified occlusion of her mid left SFA with one vessel runoff via the peroneal. She'll need to have this fixed percutaneously by antegrade access of the left SFA and diamondback orbital rotational arthrectomy, PTA and/or stenting. She'll be kept overnight and hydrated, discharged the morning arrangements will be made to complete her procedure early next week. She left the Cath Lab in stable condition.  Runell Gess MD, [facility]  12/06/2012  4:09 PM  Treatments: See Hospital Course  Discharge Exam: Blood pressure 115/75, pulse 111, temperature 98 F (36.7 C), temperature source Oral, resp. rate  19, height 5\' 4"  (1.626 m), weight 177 lb 14.6 oz (80.7 kg), SpO2 94.00%.   Disposition: 01-Home or Self Care      Discharge Orders    Future Appointments: Provider: Department: Dept Phone: Center:   01/24/2013  10:00 AM Romero Belling, MD [facility] PRIMARY CARE ENDOCRINOLOGY [phone] None     Future Orders Please Complete By Expires   Diet - low sodium heart healthy      Increase activity slowly      Discharge instructions      Comments:   If you drive, no driving for 3 days and no lifting more than 1/2 gallon of milk for 3 days.   Call MD for:  redness, tenderness, or signs of infection (pain, swelling, redness, odor or green/yellow discharge around incision site)          Medication List     As of 12/12/2012  1:17 PM    TAKE these medications         acetaminophen 325 MG tablet   Commonly known as: TYLENOL   Take 2 tablets (650 mg total) by mouth every 4 (four) hours as needed.      albuterol 108 (90 BASE) MCG/ACT inhaler   Commonly known as: PROVENTIL HFA;VENTOLIN HFA   Inhale 1 puff into the lungs every 6 (six) hours as needed. FOR SHORTNESS OF BREATH      ammonium lactate 12 % lotion   Commonly known as: LAC-HYDRIN   Apply 1 application topically daily as needed. For skin condition.      aspirin EC 81 MG tablet   Take 81 mg by mouth daily.      Clobetasol Propionate 0.05 % external spray   Commonly known as: TEMOVATE   Apply 1 spray topically 2 (two) times daily as needed. For psoriasis flare-ups      clopidogrel 75 MG tablet   Commonly known as: PLAVIX   Take 1 tablet (75 mg total) by mouth daily with breakfast.      collagenase ointment   Commonly known as: SANTYL   Apply topically daily. Apply to affected area Lt. Foot daily      esomeprazole 40 MG capsule   Commonly known as: NEXIUM   Take 40 mg by mouth daily before breakfast.      Eye Vitamins Caps   Take 1 capsule by mouth daily.      fluticasone 50 MCG/ACT nasal spray   Commonly known as: FLONASE   Place 2 sprays into the nose daily as needed. For allergies      Fluticasone-Salmeterol 250-50 MCG/DOSE Aepb   Commonly known as: ADVAIR   Inhale 1 puff into the lungs every 12 (twelve) hours.       glyBURIDE-metformin 2.5-500 MG per tablet   Commonly known as: GLUCOVANCE   Take 1 tablet by mouth daily with breakfast.   Start taking on: 12/13/2012      HYDROcodone-acetaminophen 5-325 MG per tablet   Commonly known as: NORCO/VICODIN   Take 1 tablet by mouth every 4 (four) hours as needed for pain.      MYLANTA GAS PO   Take 1 tablet by mouth daily as needed. For gas      olmesartan-hydrochlorothiazide 40-12.5 MG per tablet   Commonly known as: BENICAR HCT   Take 1 tablet by mouth every morning.      BENICAR HCT 20-12.5 MG per tablet   Generic drug: olmesartan-hydrochlorothiazide   Take 1 tablet by mouth at bedtime.  predniSONE 5 MG tablet   Commonly known as: DELTASONE   Take 5 mg by mouth daily. On Monday, Wednesday, Friday      ranitidine 150 MG capsule   Commonly known as: ZANTAC   Take 150 mg by mouth at bedtime.      theophylline 200 MG 12 hr tablet   Commonly known as: THEODUR   Take 200 mg by mouth 2 (two) times daily.      Travoprost (BAK Free) 0.004 % Soln ophthalmic solution   Commonly known as: TRAVATAN   Place 1 drop into both eyes at bedtime.        Follow-up Information    Follow up with Abelino Derrick, PA. (office will call)    Contact information:   [address] [place] [address] [phone]          Signed: Robbie Lis 12/12/2012, 1:17 PM

## 2012-12-12 NOTE — Progress Notes (Signed)
Hafsa Lohn Ingold,PT Acute Rehabilitation [phone] (pager)  

## 2012-12-12 NOTE — Progress Notes (Signed)
Pt. Seen and examined. Agree with the NP/PA-C note as written.  Small right groin hematoma, no active pain, no bruits. H/H has been stable for 24 hours. She still has toe pain, but it is improved. Concerned about losing her toe. I feel she is ok for discharge home today. Please provide adequate vicodin for pain control. Follow-up with Dr. Allyson Sabal.  Chrystie Nose, MD, [facility] Attending Cardiologist [facility]

## 2012-12-12 NOTE — Progress Notes (Signed)
Physical Therapy Treatment Patient Details Name: [name] MRN: [id number] DOB: Apr 20, 1941 Today's Date: 12/12/2012 Time: 4098-1191 PT Time Calculation (min): 27 min  PT Assessment / Plan / Recommendation Comments on Treatment Session  Pt s/p revascularization procedure on LLE secondary to critical limb ischemia.  Pt doing well, ambulating safely but remains with balance and activity tolerance limitations.  WIll continue to benifit from PT services in acute setting to maximize independence and mobility.      Follow Up Recommendations  Home health PT           Equipment Recommendations  None recommended by PT       Frequency Min 3X/week   Plan Discharge plan remains appropriate;Frequency needs to be updated    Precautions / Restrictions Precautions Precautions: Fall Required Braces or Orthoses: Other Brace/Splint Other Brace/Splint: wearing post-op shoe on LLE Restrictions Weight Bearing Restrictions: No Other Position/Activity Restrictions: limited due to pain   Pertinent Vitals/Pain HR 84 at rest, 111 with activity O2 sats on RA 94% throughout Some pain: 5/10 at rest, 6/10 with activity    Mobility  Bed Mobility Bed Mobility: Sitting - Scoot to Edge of Bed Sitting - Scoot to Edge of Bed: 6: Modified independent (Device/Increase time) Details for Bed Mobility Assistance: pt met sitting EOB after breakfast, no difficulties, able to manipulate UE  without lob Transfers Transfers: Sit to Stand;Stand to Sit Sit to Stand: 5: Supervision;With upper extremity assist;From bed;From chair/3-in-1 Stand to Sit: 5: Supervision;With upper extremity assist;To chair/3-in-1 Details for Transfer Assistance: vc for hand placement, supervision due to pt feeling unsure about standing from low seat (chair), however no need for assist and no c/o dizziness, no lob Ambulation/Gait Ambulation/Gait Assistance: 4: Min guard Ambulation Distance (Feet): 40 Feet Assistive device: Rolling  walker Ambulation/Gait Assistance Details: vc for RW placement and to slow down for safety.  Pt self-limited by pain, places some weight through LLE, improves over time, rates change in pain from 5/10 to 6/10 with activity. Navigates through room safely Gait Pattern: Step-to pattern;Decreased stride length;Decreased weight shift to left;Decreased stance time - left Gait velocity: decr Stairs: No Wheelchair Mobility Wheelchair Mobility: No    Exercises General Exercises - Lower Extremity Ankle Circles/Pumps: AROM;Both;10 reps;Seated Short Arc Quad: AROM;Both;10 reps;Seated Hip ABduction/ADduction: AROM;Both;10 reps;Seated     PT Goals Acute Rehab PT Goals PT Goal: Sit to Stand - Progress: Progressing toward goal PT Goal: Stand to Sit - Progress: Progressing toward goal PT Goal: Ambulate - Progress: Progressing toward goal PT Goal: Up/Down Stairs - Progress: Progressing toward goal  Visit Information  Last PT Received On: 12/12/12 Assistance Needed: +1    Subjective Data  Subjective: I'm moving around pretty good Patient Stated Goal: To go home   Cognition  Overall Cognitive Status: Appears within functional limits for tasks assessed/performed Arousal/Alertness: Awake/alert Orientation Level: Oriented X4 / Intact Behavior During Session: [facility] for tasks performed    Balance  Balance Balance Assessed: Yes Static Sitting Balance Static Sitting - Balance Support: No upper extremity supported;Feet supported Static Sitting - Level of Assistance: 7: Independent Static Standing Balance Static Standing - Balance Support: Bilateral upper extremity supported;During functional activity Static Standing - Level of Assistance: 5: Stand by assistance Static Standing - Comment/# of Minutes: standing 1-30min, pt with mild imbalance due to limiting WB through LLE, no lob  End of Session PT - End of Session Activity Tolerance: Patient limited by pain;Patient limited by fatigue Patient left:  in chair;with call bell/phone within reach Nurse  Communication: Mobility status       Sharion Balloon 12/12/2012, 9:03 AM Sharion Balloon, SPT Acute Rehab Services [phone]

## 2012-12-12 NOTE — Progress Notes (Signed)
Subjective:  Doing better. Reports decreased pain in LLE.  Objective:  Vital Signs in the last 24 hours: Temp:  [97.5 F (36.4 C)-98.5 F (36.9 C)] 98 F (36.7 C) (01/15 0447) Pulse Rate:  [85-110] 85  (01/15 0447) Resp:  [18-22] 19  (01/15 0447) BP: (115-150)/(58-75) 115/75 mmHg (01/15 0447) SpO2:  [93 %-100 %] 99 % (01/15 0813)  Intake/Output from previous day: 01/14 0701 - 01/15 0700 In: 0  Out: 500 [Urine:500] Intake/Output from this shift:    Physical Exam: General appearance: alert, cooperative and no distress Lungs: clear to auscultation bilaterally Heart: regular rate and rhythm Extremities: + hematoma at left groin. Dressing over left foot. Neurologic: Grossly normal  Lab Results:  Basename 12/12/12 0605 12/11/12 1235 12/11/12 0445  WBC 10.9* -- 9.9  HGB 8.0* 8.2* --  PLT 238 -- 203    Basename 12/12/12 0605 12/11/12 0445  NA 132* 134*  K 4.4 4.9  CL 98 100  CO2 24 24  GLUCOSE 208* 237*  BUN 18 18  CREATININE 0.98 0.86    Cardiac Studies:  Assessment/Plan:  Patient Active Hospital Problem List: Critical lower limb ischemia, with ulcer Lt foot (12/07/2012) DIABETES-TYPE 2 (08/05/2009) HYPERTENSION (04/30/2010) COPD (04/27/2010) AS (ankylosing spondylitis) (10/17/2011) Immune system disorder (10/17/2011) PVD, Lt SFA stenting 12/10/12 (12/11/2012) Low risk Myoview with NL LVF as an OP (12/11/2012) Barrett esophagus (12/11/2012) Glaucoma (12/11/2012) Obesity (12/11/2012)  Plan: H/H stable. Improvement in pain in RLE. Maintainin NSR on telemetry. BP is stable. WOC managing LE wounds. Plan for d/c today.    LOS: 3 days    SIMMONS, BRITTAINY 12/12/2012, 8:56 AM

## 2012-12-27 ENCOUNTER — Ambulatory Visit (HOSPITAL_COMMUNITY)
Admission: RE | Admit: 2012-12-27 | Discharge: 2012-12-27 | Disposition: A | Discharge status: Home / Self Care | Payer: Medicare Other | Source: Ambulatory Visit | Attending: Cardiology | Admitting: Cardiology

## 2012-12-27 DIAGNOSIS — I739 Peripheral vascular disease, unspecified: Secondary | ICD-10-CM | POA: Insufficient documentation

## 2012-12-27 NOTE — Progress Notes (Signed)
Left lower extremity arterial duplex completed. [name]

## 2013-01-01 ENCOUNTER — Emergency Department (HOSPITAL_COMMUNITY): Payer: Medicare Other

## 2013-01-01 ENCOUNTER — Inpatient Hospital Stay (HOSPITAL_COMMUNITY)
Admission: EM | Admit: 2013-01-01 | Discharge: 2013-01-11 | DRG: 581 | Disposition: A | Discharge status: Skilled Nursing Facility | Payer: Medicare Other | Attending: Internal Medicine | Admitting: Internal Medicine

## 2013-01-01 ENCOUNTER — Encounter (HOSPITAL_COMMUNITY): Payer: Self-pay | Admitting: *Deleted

## 2013-01-01 DIAGNOSIS — R5381 Other malaise: Secondary | ICD-10-CM

## 2013-01-01 DIAGNOSIS — I70229 Atherosclerosis of native arteries of extremities with rest pain, unspecified extremity: Secondary | ICD-10-CM

## 2013-01-01 DIAGNOSIS — R627 Adult failure to thrive: Secondary | ICD-10-CM | POA: Diagnosis present

## 2013-01-01 DIAGNOSIS — R5383 Other fatigue: Secondary | ICD-10-CM

## 2013-01-01 DIAGNOSIS — R531 Weakness: Secondary | ICD-10-CM

## 2013-01-01 DIAGNOSIS — L409 Psoriasis, unspecified: Secondary | ICD-10-CM

## 2013-01-01 DIAGNOSIS — I739 Peripheral vascular disease, unspecified: Secondary | ICD-10-CM

## 2013-01-01 DIAGNOSIS — D899 Disorder involving the immune mechanism, unspecified: Secondary | ICD-10-CM

## 2013-01-01 DIAGNOSIS — K219 Gastro-esophageal reflux disease without esophagitis: Secondary | ICD-10-CM

## 2013-01-01 DIAGNOSIS — R011 Cardiac murmur, unspecified: Secondary | ICD-10-CM

## 2013-01-01 DIAGNOSIS — D72829 Elevated white blood cell count, unspecified: Secondary | ICD-10-CM

## 2013-01-01 DIAGNOSIS — Z79899 Other long term (current) drug therapy: Secondary | ICD-10-CM

## 2013-01-01 DIAGNOSIS — R160 Hepatomegaly, not elsewhere classified: Secondary | ICD-10-CM

## 2013-01-01 DIAGNOSIS — IMO0001 Reserved for inherently not codable concepts without codable children: Secondary | ICD-10-CM

## 2013-01-01 DIAGNOSIS — K449 Diaphragmatic hernia without obstruction or gangrene: Secondary | ICD-10-CM

## 2013-01-01 DIAGNOSIS — R6883 Chills (without fever): Secondary | ICD-10-CM

## 2013-01-01 DIAGNOSIS — I998 Other disorder of circulatory system: Secondary | ICD-10-CM

## 2013-01-01 DIAGNOSIS — K761 Chronic passive congestion of liver: Secondary | ICD-10-CM

## 2013-01-01 DIAGNOSIS — K589 Irritable bowel syndrome without diarrhea: Secondary | ICD-10-CM

## 2013-01-01 DIAGNOSIS — M79609 Pain in unspecified limb: Secondary | ICD-10-CM

## 2013-01-01 DIAGNOSIS — Z7982 Long term (current) use of aspirin: Secondary | ICD-10-CM

## 2013-01-01 DIAGNOSIS — J4489 Other specified chronic obstructive pulmonary disease: Secondary | ICD-10-CM

## 2013-01-01 DIAGNOSIS — F411 Generalized anxiety disorder: Secondary | ICD-10-CM | POA: Diagnosis present

## 2013-01-01 DIAGNOSIS — R1310 Dysphagia, unspecified: Secondary | ICD-10-CM

## 2013-01-01 DIAGNOSIS — J449 Chronic obstructive pulmonary disease, unspecified: Secondary | ICD-10-CM

## 2013-01-01 DIAGNOSIS — L0390 Cellulitis, unspecified: Secondary | ICD-10-CM

## 2013-01-01 DIAGNOSIS — J45909 Unspecified asthma, uncomplicated: Secondary | ICD-10-CM

## 2013-01-01 DIAGNOSIS — I10 Essential (primary) hypertension: Secondary | ICD-10-CM

## 2013-01-01 DIAGNOSIS — M459 Ankylosing spondylitis of unspecified sites in spine: Secondary | ICD-10-CM

## 2013-01-01 DIAGNOSIS — E119 Type 2 diabetes mellitus without complications: Secondary | ICD-10-CM

## 2013-01-01 DIAGNOSIS — E669 Obesity, unspecified: Secondary | ICD-10-CM

## 2013-01-01 DIAGNOSIS — D509 Iron deficiency anemia, unspecified: Secondary | ICD-10-CM

## 2013-01-01 DIAGNOSIS — D649 Anemia, unspecified: Secondary | ICD-10-CM | POA: Diagnosis present

## 2013-01-01 DIAGNOSIS — L02419 Cutaneous abscess of limb, unspecified: Principal | ICD-10-CM | POA: Diagnosis present

## 2013-01-01 DIAGNOSIS — J329 Chronic sinusitis, unspecified: Secondary | ICD-10-CM | POA: Diagnosis present

## 2013-01-01 DIAGNOSIS — F17200 Nicotine dependence, unspecified, uncomplicated: Secondary | ICD-10-CM | POA: Diagnosis present

## 2013-01-01 DIAGNOSIS — H409 Unspecified glaucoma: Secondary | ICD-10-CM

## 2013-01-01 DIAGNOSIS — L4050 Arthropathic psoriasis, unspecified: Secondary | ICD-10-CM

## 2013-01-01 DIAGNOSIS — E079 Disorder of thyroid, unspecified: Secondary | ICD-10-CM

## 2013-01-01 DIAGNOSIS — K2270 Barrett's esophagus without dysplasia: Secondary | ICD-10-CM

## 2013-01-01 DIAGNOSIS — L408 Other psoriasis: Secondary | ICD-10-CM

## 2013-01-01 DIAGNOSIS — D3500 Benign neoplasm of unspecified adrenal gland: Secondary | ICD-10-CM

## 2013-01-01 DIAGNOSIS — E039 Hypothyroidism, unspecified: Secondary | ICD-10-CM

## 2013-01-01 DIAGNOSIS — L97529 Non-pressure chronic ulcer of other part of left foot with unspecified severity: Secondary | ICD-10-CM

## 2013-01-01 DIAGNOSIS — R21 Rash and other nonspecific skin eruption: Secondary | ICD-10-CM

## 2013-01-01 DIAGNOSIS — M109 Gout, unspecified: Secondary | ICD-10-CM

## 2013-01-01 HISTORY — DX: Gout, unspecified: M10.9

## 2013-01-01 LAB — COMPREHENSIVE METABOLIC PANEL
ALT: 35 U/L (ref 0–35)
Albumin: 2.6 g/dL — ABNORMAL LOW (ref 3.5–5.2)
Alkaline Phosphatase: 112 U/L (ref 39–117)
GFR calc Af Amer: >90 mL/min (ref >90)
Glucose, Bld: 212 mg/dL — ABNORMAL HIGH (ref 70–99)
Potassium: 4.1 mEq/L (ref 3.5–5.1)
Sodium: 135 mEq/L (ref 135–145)
Total Protein: 6.7 g/dL (ref 6.0–8.3)

## 2013-01-01 LAB — CBC WITH DIFFERENTIAL/PLATELET
Eosinophils Absolute: 0.2 10*3/uL (ref 0.0–0.7)
Lymphs Abs: 1.2 10*3/uL (ref 0.7–4.0)
MCH: 26 pg (ref 26.0–34.0)
Neutrophils Relative %: 79 % — ABNORMAL HIGH (ref 43–77)
Platelets: 314 10*3/uL (ref 150–400)
RBC: 2.92 MIL/uL — ABNORMAL LOW (ref 3.87–5.11)
WBC: 13.1 10*3/uL — ABNORMAL HIGH (ref 4.0–10.5)

## 2013-01-01 LAB — CBC
HCT: 26.2 % — ABNORMAL LOW (ref 36.0–46.0)
MCHC: 30.5 g/dL (ref 30.0–36.0)
MCV: 87 fL (ref 78.0–100.0)
Platelets: 323 10*3/uL (ref 150–400)
RDW: 19.4 % — ABNORMAL HIGH (ref 11.5–15.5)
WBC: 12.4 10*3/uL — ABNORMAL HIGH (ref 4.0–10.5)

## 2013-01-01 LAB — CREATININE, SERUM: GFR calc Af Amer: 72 mL/min — ABNORMAL LOW (ref >90)

## 2013-01-01 LAB — RETICULOCYTES
RBC.: 2.8 MIL/uL — ABNORMAL LOW (ref 3.87–5.11)
Retic Count, Absolute: 182 10*3/uL (ref 19.0–186.0)

## 2013-01-01 LAB — GLUCOSE, CAPILLARY: Glucose-Capillary: 202 mg/dL — ABNORMAL HIGH (ref 70–99)

## 2013-01-01 LAB — TROPONIN I: Troponin I: <0.3 ng/mL (ref <0.30)

## 2013-01-01 MED ORDER — HYDROMORPHONE HCL PF 1 MG/ML IJ SOLN
0.5000 mg | INTRAMUSCULAR | Status: AC | PRN
  Administered 2013-01-01: 0.5 mg via INTRAVENOUS
  Filled 2013-01-01: qty 1

## 2013-01-01 MED ORDER — CLOPIDOGREL BISULFATE 75 MG PO TABS
75.0000 mg | ORAL_TABLET | Freq: Every day | ORAL | Status: DC
  Administered 2013-01-02 – 2013-01-04 (×3): 75 mg via ORAL
  Filled 2013-01-01 (×5): qty 1

## 2013-01-01 MED ORDER — DOCUSATE SODIUM 100 MG PO CAPS
100.0000 mg | ORAL_CAPSULE | Freq: Two times a day (BID) | ORAL | Status: DC
  Administered 2013-01-01 – 2013-01-11 (×19): 100 mg via ORAL
  Filled 2013-01-01 (×21): qty 1

## 2013-01-01 MED ORDER — HYDROMORPHONE HCL PF 1 MG/ML IJ SOLN
1.0000 mg | Freq: Once | INTRAMUSCULAR | Status: AC
  Administered 2013-01-01: 1 mg via INTRAVENOUS
  Filled 2013-01-01: qty 1

## 2013-01-01 MED ORDER — DIPHENHYDRAMINE HCL 25 MG PO CAPS
25.0000 mg | ORAL_CAPSULE | Freq: Four times a day (QID) | ORAL | Status: DC | PRN
  Administered 2013-01-01 – 2013-01-04 (×8): 25 mg via ORAL
  Filled 2013-01-01 (×8): qty 1

## 2013-01-01 MED ORDER — INSULIN GLARGINE 100 UNIT/ML ~~LOC~~ SOLN
10.0000 [IU] | Freq: Every day | SUBCUTANEOUS | Status: DC
  Administered 2013-01-01: 10 [IU] via SUBCUTANEOUS

## 2013-01-01 MED ORDER — INSULIN ASPART 100 UNIT/ML ~~LOC~~ SOLN
0.0000 [IU] | Freq: Three times a day (TID) | SUBCUTANEOUS | Status: DC
  Administered 2013-01-02 (×2): 5 [IU] via SUBCUTANEOUS

## 2013-01-01 MED ORDER — FLUTICASONE PROPIONATE 50 MCG/ACT NA SUSP
2.0000 | Freq: Every day | NASAL | Status: DC | PRN
  Filled 2013-01-01: qty 16

## 2013-01-01 MED ORDER — PANTOPRAZOLE SODIUM 40 MG PO TBEC
80.0000 mg | DELAYED_RELEASE_TABLET | Freq: Every day | ORAL | Status: DC
  Administered 2013-01-02 – 2013-01-04 (×3): 80 mg via ORAL
  Filled 2013-01-01 (×2): qty 1
  Filled 2013-01-01: qty 2

## 2013-01-01 MED ORDER — THEOPHYLLINE ER 200 MG PO TB12
200.0000 mg | ORAL_TABLET | Freq: Two times a day (BID) | ORAL | Status: DC
  Administered 2013-01-01 – 2013-01-11 (×19): 200 mg via ORAL
  Filled 2013-01-01 (×21): qty 1

## 2013-01-01 MED ORDER — SODIUM CHLORIDE 0.9 % IV SOLN: 250.0000 mL | INTRAVENOUS | Status: DC | PRN

## 2013-01-01 MED ORDER — SODIUM CHLORIDE 0.9 % IJ SOLN
3.0000 mL | Freq: Two times a day (BID) | INTRAMUSCULAR | Status: DC
  Administered 2013-01-02 – 2013-01-11 (×14): 3 mL via INTRAVENOUS

## 2013-01-01 MED ORDER — FAMOTIDINE 20 MG PO TABS
20.0000 mg | ORAL_TABLET | Freq: Every day | ORAL | Status: DC
  Administered 2013-01-01 – 2013-01-02 (×2): 20 mg via ORAL
  Filled 2013-01-01 (×3): qty 1

## 2013-01-01 MED ORDER — IRBESARTAN 300 MG PO TABS
300.0000 mg | ORAL_TABLET | Freq: Every day | ORAL | Status: DC
  Administered 2013-01-02 – 2013-01-04 (×3): 300 mg via ORAL
  Filled 2013-01-01 (×4): qty 1

## 2013-01-01 MED ORDER — ACETAMINOPHEN 325 MG PO TABS
650.0000 mg | ORAL_TABLET | ORAL | Status: DC | PRN
  Administered 2013-01-05 – 2013-01-07 (×2): 650 mg via ORAL
  Filled 2013-01-01 (×2): qty 2

## 2013-01-01 MED ORDER — OLMESARTAN MEDOXOMIL-HCTZ 40-12.5 MG PO TABS: 1.0000 | ORAL_TABLET | Freq: Every morning | ORAL | Status: DC

## 2013-01-01 MED ORDER — INSULIN ASPART 100 UNIT/ML ~~LOC~~ SOLN
4.0000 [IU] | Freq: Once | SUBCUTANEOUS | Status: AC
  Administered 2013-01-01: 4 [IU] via SUBCUTANEOUS

## 2013-01-01 MED ORDER — ENOXAPARIN SODIUM 30 MG/0.3ML ~~LOC~~ SOLN: 30.0000 mg | SUBCUTANEOUS | Status: DC

## 2013-01-01 MED ORDER — MORPHINE SULFATE 2 MG/ML IJ SOLN
1.0000 mg | INTRAMUSCULAR | Status: DC | PRN
  Administered 2013-01-02 – 2013-01-06 (×5): 1 mg via INTRAVENOUS
  Filled 2013-01-01 (×5): qty 1

## 2013-01-01 MED ORDER — HYDROCHLOROTHIAZIDE 12.5 MG PO CAPS
12.5000 mg | ORAL_CAPSULE | Freq: Every day | ORAL | Status: DC
  Administered 2013-01-02 – 2013-01-04 (×3): 12.5 mg via ORAL
  Filled 2013-01-01 (×4): qty 1

## 2013-01-01 MED ORDER — ENOXAPARIN SODIUM 40 MG/0.4ML ~~LOC~~ SOLN
40.0000 mg | SUBCUTANEOUS | Status: DC
  Administered 2013-01-01 – 2013-01-06 (×6): 40 mg via SUBCUTANEOUS
  Filled 2013-01-01 (×7): qty 0.4

## 2013-01-01 MED ORDER — HYDROCODONE-ACETAMINOPHEN 5-325 MG PO TABS
1.0000 | ORAL_TABLET | ORAL | Status: DC | PRN
  Administered 2013-01-01 – 2013-01-09 (×28): 1 via ORAL
  Filled 2013-01-01 (×28): qty 1

## 2013-01-01 MED ORDER — ALBUTEROL SULFATE (5 MG/ML) 0.5% IN NEBU: 2.5000 mg | INHALATION_SOLUTION | RESPIRATORY_TRACT | Status: DC | PRN

## 2013-01-01 MED ORDER — MOMETASONE FURO-FORMOTEROL FUM 100-5 MCG/ACT IN AERO
2.0000 | INHALATION_SPRAY | Freq: Two times a day (BID) | RESPIRATORY_TRACT | Status: DC
  Administered 2013-01-02 – 2013-01-11 (×19): 2 via RESPIRATORY_TRACT
  Filled 2013-01-01: qty 8.8

## 2013-01-01 MED ORDER — ALBUTEROL SULFATE HFA 108 (90 BASE) MCG/ACT IN AERS
1.0000 | INHALATION_SPRAY | Freq: Four times a day (QID) | RESPIRATORY_TRACT | Status: DC | PRN
Start: 2013-01-01 — End: 2013-01-11
  Filled 2013-01-01: qty 6.7

## 2013-01-01 MED ORDER — PREDNISONE 20 MG PO TABS
40.0000 mg | ORAL_TABLET | Freq: Every day | ORAL | Status: DC
  Administered 2013-01-01 – 2013-01-02 (×2): 40 mg via ORAL
  Filled 2013-01-01 (×3): qty 2

## 2013-01-01 MED ORDER — VANCOMYCIN HCL IN DEXTROSE 1-5 GM/200ML-% IV SOLN
1000.0000 mg | Freq: Two times a day (BID) | INTRAVENOUS | Status: DC
  Administered 2013-01-01 – 2013-01-02 (×2): 1000 mg via INTRAVENOUS
  Filled 2013-01-01 (×3): qty 200

## 2013-01-01 MED ORDER — SODIUM CHLORIDE 0.9 % IV BOLUS (SEPSIS)
1000.0000 mL | Freq: Once | INTRAVENOUS | Status: AC
  Administered 2013-01-01: 1000 mL via INTRAVENOUS

## 2013-01-01 MED ORDER — SODIUM CHLORIDE 0.9 % IJ SOLN
3.0000 mL | INTRAMUSCULAR | Status: DC | PRN
  Administered 2013-01-04: 3 mL via INTRAVENOUS

## 2013-01-01 MED ORDER — ASPIRIN EC 81 MG PO TBEC
81.0000 mg | DELAYED_RELEASE_TABLET | Freq: Every day | ORAL | Status: DC
  Administered 2013-01-01 – 2013-01-11 (×10): 81 mg via ORAL
  Filled 2013-01-01 (×11): qty 1

## 2013-01-01 MED ORDER — WHITE PETROLATUM GEL
Status: AC
  Filled 2013-01-01: qty 5

## 2013-01-01 MED ORDER — TRAVOPROST (BAK FREE) 0.004 % OP SOLN
1.0000 [drp] | Freq: Every day | OPHTHALMIC | Status: DC
  Administered 2013-01-01 – 2013-01-10 (×10): 1 [drp] via OPHTHALMIC
  Filled 2013-01-01 (×2): qty 2.5

## 2013-01-01 MED ORDER — CLINDAMYCIN PHOSPHATE 900 MG/50ML IV SOLN
900.0000 mg | Freq: Once | INTRAVENOUS | Status: AC
  Administered 2013-01-01: 900 mg via INTRAVENOUS
  Filled 2013-01-01: qty 50

## 2013-01-01 NOTE — H&P (Signed)
[facility] History and Physical  TAMEKA HOILAND ZOX:[id number] DOB: [birth year] DOA: 01/01/2013  Referring physician: Dr Jeraldine Loots. PCP: [name], MD  Specialists: Dr Allyson Sabal.  Chief Complaint: left foot pain, weakness, chills.   HPI: [name] is a 72 y.o. female with PMH significant for Diabetes, psoriasis, PVD sp stent to left SFA December 09 2012 who presents complaining of generalized weakness. She also relates chills, worsening left foot pain since 2 to 3 days prior to admission. She also has had a rash legs, arms and chest since 2 to 3 weeks prior to admission.   Review of Systems: The patient denies anorexia, weight loss,, vision loss, decreased hearing, hoarseness, chest pain, syncope, dyspnea on exertion, peripheral edema, balance deficits, hemoptysis, abdominal pain, melena, hematochezia, severe indigestion/heartburn, hematuria, incontinence, genital sores, muscle weakness, suspicious skin lesions, transient blindness, difficulty walking, depression, unusual weight change, abnormal bleeding,   Past Medical History  Diagnosis Date  . COPD (chronic obstructive pulmonary disease)   . Hypertension   . Asthma   . Psoriatic arthritis   . Psoriasis   . GERD (gastroesophageal reflux disease)   . Hiatal hernia   . Chronic sinus infection   . Heart murmur   . Blood transfusion ~ 1962    "11 units of blood"  . Ankylosing spondylitis   . Chronic back pain   . Dysrhythmia     "palpitations"  . Esophageal dilatation   . Candida esophagitis   . Cirrhosis of liver without mention of alcohol   . Hypercholesteremia   . Peripheral vascular disease   . Exertional dyspnea   . Pneumonia 10/07/11; 2006; 1980's  . Chronic bronchitis     "since childhood" (12/06/2012)  . Type II diabetes mellitus   . Iron deficiency anemia   . External bleeding hemorrhoids   . Sinus headache   . Arthritis     "psorasic"  . OA (osteoarthritis)   . DJD (degenerative joint  disease) of knee     "both" (12/06/2012)  . Carpal tunnel syndrome on right   . Anxiety   . Barrett esophagus   . Non-healing ulcer of foot, secondary to diabetes and PVD 12/07/2012  . PVD (peripheral vascular disease) 12/07/2012  . Tobacco abuse 12/07/2012  . Critical lower limb ischemia, with ulcer Lt foot 12/07/2012   Past Surgical History  Procedure Date  . Nasal sinus surgery 09/29/2009  . Carpal tunnel release 2011    left hand  . Lipoma excision 2010    "left occipital area"  . Cataract extraction w/ intraocular lens implant 2009/2010    right/left  . Eye surgery   . Oophorectomy     'not sure which one"  . Appendectomy   . Angioplasty 12/06/2012    "LLE"  . Abdominal hysterectomy 1976  . Breast biopsy 1960's    "think he did both" (12/06/2012)   Social History:  reports that she has been smoking Cigarettes.  She has a 40 pack-year smoking history. She has never used smokeless tobacco. She reports that she does not drink alcohol or use illicit drugs.   Allergies  Allergen Reactions  . Amlodipine Besy-Benazepril Hcl Other (See Comments)    Nervous/shakiness  . Statins Other (See Comments)    "can't take any of them; cramp me up" (12/06/2012)  . Raptiva (Efalizumab) Rash    Family History  Problem Relation Age of Onset  . Lupus Sister   . Lung cancer Sister   . Thyroid disease Sister   .  Colon cancer Neg Hx     Prior to Admission medications   Medication Sig Start Date End Date Taking? Authorizing Provider  acetaminophen (TYLENOL) 325 MG tablet Take 2 tablets (650 mg total) by mouth every 4 (four) hours as needed. 12/12/12  Yes Wilburt Finlay, PA  albuterol (PROVENTIL HFA;VENTOLIN HFA) 108 (90 BASE) MCG/ACT inhaler Inhale 1 puff into the lungs every 6 (six) hours as needed. FOR SHORTNESS OF BREATH   Yes Historical Provider, MD  ammonium lactate (LAC-HYDRIN) 12 % lotion Apply 1 application topically daily as needed. For skin condition.   Yes Historical Provider, MD  aspirin  EC 81 MG tablet Take 81 mg by mouth daily.   Yes Historical Provider, MD  Clobetasol Propionate (TEMOVATE) 0.05 % external spray Apply 1 spray topically 2 (two) times daily as needed. For psoriasis flare-ups   Yes Historical Provider, MD  clopidogrel (PLAVIX) 75 MG tablet Take 1 tablet (75 mg total) by mouth daily with breakfast. 12/12/12  Yes Wilburt Finlay, PA  collagenase (SANTYL) ointment Apply topically daily. Apply to affected area Lt. Foot daily 12/07/12  Yes Nada Boozer, NP  esomeprazole (NEXIUM) 40 MG capsule Take 40 mg by mouth daily before breakfast.   Yes Historical Provider, MD  fluticasone (FLONASE) 50 MCG/ACT nasal spray Place 2 sprays into the nose daily as needed. For allergies   Yes Historical Provider, MD  Fluticasone-Salmeterol (ADVAIR) 250-50 MCG/DOSE AEPB Inhale 1 puff into the lungs every 12 (twelve) hours.     Yes Historical Provider, MD  glyBURIDE-metformin (GLUCOVANCE) 2.5-500 MG per tablet Take 1 tablet by mouth daily with breakfast. 12/13/12  Yes Abelino Derrick, PA  HYDROcodone-acetaminophen (NORCO/VICODIN) 5-325 MG per tablet Take 1 tablet by mouth every 4 (four) hours as needed for pain. 12/07/12  Yes Nada Boozer, NP  Multiple Vitamins-Minerals (EYE VITAMINS) CAPS Take 1 capsule by mouth daily.   Yes Historical Provider, MD  olmesartan-hydrochlorothiazide (BENICAR HCT) 40-12.5 MG per tablet Take 1 tablet by mouth every morning.    Yes Historical Provider, MD  predniSONE (DELTASONE) 5 MG tablet Take 5 mg by mouth daily. On Monday, Wednesday, Friday    Yes Historical Provider, MD  ranitidine (ZANTAC) 150 MG capsule Take 150 mg by mouth at bedtime.     Yes Historical Provider, MD  Simethicone (MYLANTA GAS PO) Take 1 tablet by mouth daily as needed. For gas   Yes Historical Provider, MD  theophylline (THEODUR) 200 MG 12 hr tablet Take 200 mg by mouth 2 (two) times daily.     Yes Historical Provider, MD  Travoprost, BAK Free, (TRAVATAMN) 0.004 % SOLN ophthalmic solution Place 1 drop  into both eyes at bedtime.    Yes Historical Provider, MD  olmesartan-hydrochlorothiazide (BENICAR HCT) 20-12.5 MG per tablet Take 1 tablet by mouth at bedtime.    Historical Provider, MD   Physical Exam: Filed Vitals:   01/01/13 1138 01/01/13 1313  BP: 120/49 130/61  Pulse: 92 91  Temp: 99.5 F (37.5 C)   TempSrc: Oral   Resp:  20  SpO2: 100% 98%   General Appearance:    Alert, cooperative, no distress, appears stated age  Head:    Normocephalic, without obvious abnormality, atraumatic  Eyes:    PERRL, conjunctiva/corneas clear, EOM's intact,     Ears:    Normal TM's and external ear canals, both ears  Nose:   Nares normal, septum midline, mucosa normal, no drainage    or sinus tenderness  Throat:   Lips, mucosa, and  tongue normal; teeth and gums normal  Neck:   Supple, symmetrical, trachea midline, no adenopathy;       thyroid:  No enlargement/tenderness/nodules; no carotid   bruit or JVD  Back:     Symmetric, no curvature, ROM normal, no CVA tenderness  Lungs:     Clear to auscultation bilaterally, respirations unlabored  Chest wall:    No tenderness or deformity  Heart:    Regular rate and rhythm, S1 and S2 normal, no murmur, rub   or gallop  Abdomen:     Soft, non-tender, bowel sounds active all four quadrants,    no masses, no organomegaly        Extremities:   Bilateral lower extremities with redness, swelling, left foot with 1 and 2 toe black coloration.   Pulses:   2+ and symmetric all extremities  Skin:   Erythematous rash, with plaques, chest, arms, legs.   Lymph nodes:   Cervical, supraclavicular, and axillary nodes normal  Neurologic:   CNII-XII intact. Normal strength, sensation and reflexes      throughout      Labs on Admission:  Basic Metabolic Panel:  Lab 01/01/13 1610  NA 135  K 4.1  CL 101  CO2 22  GLUCOSE 212*  BUN 15  CREATININE 0.78  CALCIUM 9.3  MG --  PHOS --   Liver Function Tests:  Lab 01/01/13 1229  AST 25  ALT 35  ALKPHOS 112   BILITOT 0.4  PROT 6.7  ALBUMIN 2.6*   No results found for this basename: LIPASE:5,AMYLASE:5 in the last 168 hours No results found for this basename: AMMONIA:5 in the last 168 hours CBC:  Lab 01/01/13 1229  WBC 13.1*  NEUTROABS 10.4*  HGB 7.6*  HCT 25.5*  MCV 87.3  PLT 314   Cardiac Enzymes: No results found for this basename: CKTOTAL:5,CKMB:5,CKMBINDEX:5,TROPONINI:5 in the last 168 hours  BNP (last 3 results) No results found for this basename: PROBNP:3 in the last 8760 hours CBG: No results found for this basename: GLUCAP:5 in the last 168 hours  Radiological Exams on Admission: Dg Chest 2 View  01/01/2013  *RADIOLOGY REPORT*  Clinical Data: COPD  CHEST - 2 VIEW  Comparison: Chest radiograph 12/03/2012  Findings: Normal mediastinum and heart silhouette.  Costophrenic angles are clear.  No effusion, infiltrate, or pneumothorax. Degenerative changes at the right shoulder are stable. Degenerative osteophytosis of the thoracic spine.  IMPRESSION: No acute cardiopulmonary process.   Original Report Authenticated By: Genevive Bi, M.D.    Dg Foot Complete Left  01/01/2013  *RADIOLOGY REPORT*  Clinical Data: Pressure ulcers left foot  LEFT FOOT - COMPLETE 3+ VIEW  Comparison: None.  Findings: There is  skin irregularity adjacent to the first metatarsal.  There is no subcutaneous gas.  There is no evidence of cortical destruction to suggest active osteomyelitis.  Mild osteopenia.  IMPRESSION: 1.  No radiographic evidence of osteomyelitis. 2.  Soft tissue deformity  adjacent to  the first metatarsal.   Original Report Authenticated By: Genevive Bi, M.D.     EKG: Independently reviewed. Ordered.  Assessment/Plan Active Problems:  DIABETES-TYPE 2  HYPERTENSION  COPD  Hypothyroidism (acquired)  Critical lower limb ischemia, with ulcer Lt foot- S/P atherectomy, PTA/stenting 12/10/12  PVD, Lt SFA stenting 12/10/12  Leukocytosis  1-Left lower extremity Cellulitis, ischemia: CV,  Dr Allyson Sabal team to evaluate patient. Patient relates black coloration of lrft foot, 2 digits chronic. I will start Vancomycin per pharmacy.  2-Leukocytosis, Mild fever; Suspect secondary  to cellulitis. Will order UA. Blood culture. IV antibiotics.  3-Rash// History of psoriasis: Has had rash for weeks. She relates itching.  She will needs to follow up with her dermatologist. I will increase prednisone to 40 mg daily. Home dose 5 mg Three times per week.  4-Weakness: Probably failure to thrive, acute illness. PT evaluation.  5-Diabetes; Start Lantus,. SSI. Hold metformin while inpatient.  6-Anemia: Hb at 7.6. Will order anemia panel. Will order guaiac stool. Anemia might explain weakness.    Code Status:  Family Communication: Care discussed with patient.  Disposition Plan: Expect 4 to 5 days.   Time spent: 50 minutes.   Marlan Steward [facility] Pager [phone]  If 7PM-7AM, please contact night-coverage [email or web address] Password [facility] 01/01/2013, 4:24 PM

## 2013-01-01 NOTE — ED Provider Notes (Addendum)
History     CSN: [id number]  Arrival date & time 01/01/13  1115   First MD Initiated Contact with Patient 01/01/13 1117      Chief Complaint  Patient presents with  . Fatigue     HPI  The patient presents with multiple complaints.  She complains primarily of increasing fatigue, with unclear onset, and no clear alleviating or exacerbating factors.  She also complains of ongoing distal lower extremity pain, more on the left than the right.  Notably, the patient had reperfusion with stenting of her left lower extremity within the past month. She notes that since that time she has had increasing pain, but also increasing sensation in the distal left lower extremity. She denies current fever, chills, vomiting, diarrhea, chest pain, dyspnea.   Past Medical History  Diagnosis Date  . COPD (chronic obstructive pulmonary disease)   . Hypertension   . Asthma   . Psoriatic arthritis   . Psoriasis   . GERD (gastroesophageal reflux disease)   . Hiatal hernia   . Chronic sinus infection   . Heart murmur   . Blood transfusion ~ 1962    "11 units of blood"  . Ankylosing spondylitis   . Chronic back pain   . Dysrhythmia     "palpitations"  . Esophageal dilatation   . Candida esophagitis   . Cirrhosis of liver without mention of alcohol   . Hypercholesteremia   . Peripheral vascular disease   . Exertional dyspnea   . Pneumonia 10/07/11; 2006; 1980's  . Chronic bronchitis     "since childhood" (12/06/2012)  . Type II diabetes mellitus   . Iron deficiency anemia   . External bleeding hemorrhoids   . Sinus headache   . Arthritis     "psorasic"  . OA (osteoarthritis)   . DJD (degenerative joint disease) of knee     "both" (12/06/2012)  . Carpal tunnel syndrome on right   . Anxiety   . Barrett esophagus   . Non-healing ulcer of foot, secondary to diabetes and PVD 12/07/2012  . PVD (peripheral vascular disease) 12/07/2012  . Tobacco abuse 12/07/2012  . Critical lower limb ischemia, with  ulcer Lt foot 12/07/2012    Past Surgical History  Procedure Date  . Nasal sinus surgery 09/29/2009  . Carpal tunnel release 2011    left hand  . Lipoma excision 2010    "left occipital area"  . Cataract extraction w/ intraocular lens implant 2009/2010    right/left  . Eye surgery   . Oophorectomy     'not sure which one"  . Appendectomy   . Angioplasty 12/06/2012    "LLE"  . Abdominal hysterectomy 1976  . Breast biopsy 1960's    "think he did both" (12/06/2012)    Family History  Problem Relation Age of Onset  . Lupus Sister   . Lung cancer Sister   . Thyroid disease Sister   . Colon cancer Neg Hx     History  Substance Use Topics  . Smoking status: Current Some Day Smoker -- 1.0 packs/day for 40 years    Types: Cigarettes  . Smokeless tobacco: Never Used     Comment: 12/06/2012 "trying e-cigarettes now"  . Alcohol Use: No     Comment: "quit social drinking years ago"    OB History    Grav Para Term Preterm Abortions TAB SAB Ect Mult Living  Review of Systems  Constitutional:       Per HPI, otherwise negative  HENT:       Per HPI, otherwise negative  Eyes: Negative.   Respiratory:       Per HPI, otherwise negative  Cardiovascular:       Per HPI, otherwise negative  Gastrointestinal: Negative for vomiting.  Genitourinary: Negative.   Musculoskeletal:       Per HPI, otherwise negative  Skin: Negative.        Patient with multiple psoriatic outbreak lesions, she denies changes within the past 2-3 days, states that these are new within the past couple weeks  Neurological: Negative for syncope.    Allergies  Amlodipine besy-benazepril hcl; Statins; and Raptiva  Home Medications   Current Outpatient Rx  Name  Route  Sig  Dispense  Refill  . ACETAMINOPHEN 325 MG PO TABS   Oral   Take 2 tablets (650 mg total) by mouth every 4 (four) hours as needed.         . ALBUTEROL SULFATE HFA 108 (90 BASE) MCG/ACT IN AERS   Inhalation   Inhale 1  puff into the lungs every 6 (six) hours as needed. FOR SHORTNESS OF BREATH         . AMMONIUM LACTATE 12 % EX LOTN   Topical   Apply 1 application topically daily as needed. For skin condition.         . ASPIRIN EC 81 MG PO TBEC   Oral   Take 81 mg by mouth daily.         Marland Kitchen CLOBETASOL PROPIONATE 0.05 % EX LIQD   Topical   Apply 1 spray topically 2 (two) times daily as needed. For psoriasis flare-ups         . CLOPIDOGREL BISULFATE 75 MG PO TABS   Oral   Take 1 tablet (75 mg total) by mouth daily with breakfast.   30 tablet   11   . COLLAGENASE 250 UNIT/GM EX OINT   Topical   Apply topically daily. Apply to affected area Lt. Foot daily   15 g      . ESOMEPRAZOLE MAGNESIUM 40 MG PO CPDR   Oral   Take 40 mg by mouth daily before breakfast.         . FLUTICASONE PROPIONATE 50 MCG/ACT NA SUSP   Nasal   Place 2 sprays into the nose daily as needed. For allergies         . FLUTICASONE-SALMETEROL 250-50 MCG/DOSE IN AEPB   Inhalation   Inhale 1 puff into the lungs every 12 (twelve) hours.           . GLYBURIDE-METFORMIN 2.5-500 MG PO TABS   Oral   Take 1 tablet by mouth daily with breakfast.           Resume 12/13/12   . HYDROCODONE-ACETAMINOPHEN 5-325 MG PO TABS   Oral   Take 1 tablet by mouth every 4 (four) hours as needed for pain.   30 tablet   0   . EYE VITAMINS PO CAPS   Oral   Take 1 capsule by mouth daily.         Marland Kitchen OLMESARTAN MEDOXOMIL-HCTZ 40-12.5 MG PO TABS   Oral   Take 1 tablet by mouth every morning.          Marland Kitchen PREDNISONE 5 MG PO TABS   Oral   Take 5 mg by mouth daily. On Monday, Wednesday, Friday          .  RANITIDINE HCL 150 MG PO CAPS   Oral   Take 150 mg by mouth at bedtime.           Marland Kitchen MYLANTA GAS PO   Oral   Take 1 tablet by mouth daily as needed. For gas         . THEOPHYLLINE 200 MG PO TB12   Oral   Take 200 mg by mouth 2 (two) times daily.           . TRAVOPROST (BAK FREE) 0.004 % OP SOLN   Both Eyes    Place 1 drop into both eyes at bedtime.          Marland Kitchen OLMESARTAN MEDOXOMIL-HCTZ 20-12.5 MG PO TABS   Oral   Take 1 tablet by mouth at bedtime.           BP 120/49  Pulse 92  Temp 99.5 F (37.5 C) (Oral)  SpO2 100%  Physical Exam  Nursing note and vitals reviewed. Constitutional: Vital signs are normal. She appears ill.  HENT:  Head: Normocephalic and atraumatic.  Cardiovascular: Normal rate.   Murmur heard. Pulmonary/Chest: Effort normal. No respiratory distress.  Abdominal: Soft. She exhibits no distension.  Musculoskeletal:       Feet:       About both distal lower extremities, there is mild edema, but color, Refill are good.  There is an appreciable pulses in the left lower extremity distally. The patient can flex and extend both feet, with all the toes    ED Course  Procedures (including critical care time)   Labs Reviewed  CBC WITH DIFFERENTIAL  COMPREHENSIVE METABOLIC PANEL   No results found.   No diagnosis found.  3:29 PM Patient states her pain is improved.   I discussed the patient's case with both the hospitalist service and the patient's CV surgical team.  MDM  This elderly female with recent left revascularization of her leg and now presents with increasing pain, erythema about the distal left lower.  On exam she is afebrile, but with recent revascularization, erythema, there is  suspicion for possible infection.  The pain and erythema may be secondary to reperfusion, though the procedure was 3 weeks ago.  She was admitted for further evaluation and management.        Gerhard Munch, MD 01/01/13 1531  Gerhard Munch, MD 01/01/13 1535

## 2013-01-01 NOTE — Progress Notes (Signed)
[facility]  The patient is a 72 year old African American female with a history of critical limb ischemia in the left leg/foot, peripheral artery disease, diabetes mellitus, hypertension, hyperlipidemia, and 25-pack-years' tobacco abuse. She was admitted on December 09, 2012, to [facility], and underwent [facility] orbital rotational atherectomy, PTA, and stenting of the left SFA. This was completed by Dr. Allyson Sabal. She was found to have a short-segment calcified occlusion of the mid left SFA. There was 1-vessel runoff via the peroneal. During hospitalization, she had a drop in hemoglobin to 7.7, but then it had improved to 8.2 without transfusion and remained stable. She had wound care consults due to multiple foot ulcers, left lateral dorsal aspect of her foot near the base of her fifth digit and on her left heel. Patient does see a podiatrist. She has been intolerant to statin drugs in the past. She also has Barrett's esophagus and gastroesophageal reflux disease and COPD as well as plaque psoriasis, and has been on Enbrel and Humira in the past. Currently, the patient is complaining of being weak and tired with no energy, and also with some chills. She states that the lower extremity edema in her left leg has been improved considerably. She also complains of being dizzy, short of breath, but denies nausea, vomiting, fevers, or chest pain. She does also maintain some left lower extremity pain, which has improved some. She has checked her blood pressure when at home. It has been 110/50.   Recent LEA dopplers, completed on 1/30: right 0.59 left 0.75.  She was also given seven days of PO Keflex  12/21/12 for left LE cellulitis which apparently helped short term.    Subjective: She complains of increased pain in the lower extremities along with swelling, erythema, chills, and rash on her chest and back.  She denies fever, nausea, vomiting, SOB, ABD pain,  dysuria, hematuria, hematochezia, melena.   Objective: Vital signs in last 24 hours: Temp:  [99.5 F (37.5 C)] 99.5 F (37.5 C) (02/04 1138) Pulse Rate:  [91-92] 91  (02/04 1313) Resp:  [20] 20  (02/04 1313) BP: (120-130)/(49-61) 130/61 mmHg (02/04 1313) SpO2:  [98 %-100 %] 98 % (02/04 1313)    Intake/Output from previous day:   Intake/Output this shift:    Medications No current facility-administered medications for this encounter.   Current Outpatient Prescriptions  Medication Sig Dispense Refill  . acetaminophen (TYLENOL) 325 MG tablet Take 2 tablets (650 mg total) by mouth every 4 (four) hours as needed.      Marland Kitchen albuterol (PROVENTIL HFA;VENTOLIN HFA) 108 (90 BASE) MCG/ACT inhaler Inhale 1 puff into the lungs every 6 (six) hours as needed. FOR SHORTNESS OF BREATH      . ammonium lactate (LAC-HYDRIN) 12 % lotion Apply 1 application topically daily as needed. For skin condition.      Marland Kitchen aspirin EC 81 MG tablet Take 81 mg by mouth daily.      . Clobetasol Propionate (TEMOVATE) 0.05 % external spray Apply 1 spray topically 2 (two) times daily as needed. For psoriasis flare-ups      . clopidogrel (PLAVIX) 75 MG tablet Take 1 tablet (75 mg total) by mouth daily with breakfast.  30 tablet  11  . collagenase (SANTYL) ointment Apply topically daily. Apply to affected area Lt. Foot daily  15 g    . esomeprazole (NEXIUM) 40 MG capsule Take 40 mg by mouth daily before breakfast.      . fluticasone (FLONASE) 50  MCG/ACT nasal spray Place 2 sprays into the nose daily as needed. For allergies      . Fluticasone-Salmeterol (ADVAIR) 250-50 MCG/DOSE AEPB Inhale 1 puff into the lungs every 12 (twelve) hours.        Marland Kitchen glyBURIDE-metformin (GLUCOVANCE) 2.5-500 MG per tablet Take 1 tablet by mouth daily with breakfast.      . HYDROcodone-acetaminophen (NORCO/VICODIN) 5-325 MG per tablet Take 1 tablet by mouth every 4 (four) hours as needed for pain.  30 tablet  0  . Multiple Vitamins-Minerals (EYE  VITAMINS) CAPS Take 1 capsule by mouth daily.      Marland Kitchen olmesartan-hydrochlorothiazide (BENICAR HCT) 40-12.5 MG per tablet Take 1 tablet by mouth every morning.       . predniSONE (DELTASONE) 5 MG tablet Take 5 mg by mouth daily. On Monday, Wednesday, Friday       . ranitidine (ZANTAC) 150 MG capsule Take 150 mg by mouth at bedtime.        . Simethicone (MYLANTA GAS PO) Take 1 tablet by mouth daily as needed. For gas      . theophylline (THEODUR) 200 MG 12 hr tablet Take 200 mg by mouth 2 (two) times daily.        . Travoprost, BAK Free, (TRAVATAMN) 0.004 % SOLN ophthalmic solution Place 1 drop into both eyes at bedtime.       Marland Kitchen olmesartan-hydrochlorothiazide (BENICAR HCT) 20-12.5 MG per tablet Take 1 tablet by mouth at bedtime.        PE: General appearance: alert, cooperative and looks uncomfortable Lungs: right sided wheeze.  No rales or rhonchi Heart: regular rate and rhythm, S1, S2 normal, no murmur, click, rub or gallop Abdomen: +BS, nontender. Extremities: 1+ LEE, legs are warm, with diffuse erythema.  Necrotic appearing 1st and 2nd digits on the left foot. Pulses: 1+ right DP. Skin: Diffuse, dry, flaky, erythematous rash on her back and upper chest area Neurologic: Alert and oriented.  Lab Results:   Basename 01/01/13 1229  WBC 13.1*  HGB 7.6*  HCT 25.5*  PLT 314   BMET  Basename 01/01/13 1229  NA 135  K 4.1  CL 101  CO2 22  GLUCOSE 212*  BUN 15  CREATININE 0.78  CALCIUM 9.3   CHEST - 2 VIEW  Comparison: Chest radiograph 12/03/2012  Findings: Normal mediastinum and heart silhouette. Costophrenic  angles are clear. No effusion, infiltrate, or pneumothorax.  Degenerative changes at the right shoulder are stable. Degenerative  osteophytosis of the thoracic spine.  IMPRESSION:  No acute cardiopulmonary process.  LEFT FOOT - COMPLETE 3+ VIEW  Comparison: None.  Findings: There is skin irregularity adjacent to the first  metatarsal. There is no subcutaneous gas.  There is no evidence of  cortical destruction to suggest active osteomyelitis. Mild  osteopenia.  IMPRESSION:  1. No radiographic evidence of osteomyelitis.  2. Soft tissue deformity adjacent to the first metatarsal.  Assessment/Plan  Active Problems:  DIABETES-TYPE 2  HYPERTENSION  COPD  Hypothyroidism (acquired)  Critical lower limb ischemia, with ulcer Lt foot- S/P atherectomy, PTA/stenting 12/10/12  PVD, Lt SFA stenting 12/10/12  Leukocytosis  Plan:  The wounds on her foot actually look better then when I saw her in the office on 12/21/12.  They are dry and appear to be healing.  Symptoms appear infectious with chills and increased WBCs.     LOS: 0 days    HAGER, BRYAN 01/01/2013 4:29 PM   Agree with note written by Jones Skene PAC  Pt  has a good left popliteal pulse. Wounds appear slightly improved and arterial dopplers in our office better as well. Agree with blood cultures, empiric broad spectrum antibiotics. She has a new rash as well that looks like a drug rash. The left first and second toes appear gangrenous but dry and will probably auto amputate.   Runell Gess 01/01/2013 5:51 PM

## 2013-01-01 NOTE — Progress Notes (Signed)
ANTIBIOTIC CONSULT NOTE - INITIAL  Pharmacy Consult for Vancomycin Indication: LLE cellulitis  Allergies  Allergen Reactions  . Amlodipine Besy-Benazepril Hcl Other (See Comments)    Nervous/shakiness  . Statins Other (See Comments)    "can't take any of them; cramp me up" (12/06/2012)  . Raptiva (Efalizumab) Rash    Patient Measurements: Height: 5\' 4"  (162.6 cm) Weight: 177 lb 14.6 oz (80.7 kg) IBW/kg (Calculated) : 54.7   Vital Signs: Temp: 98.5 F (36.9 C) (02/04 1834) Temp src: Oral (02/04 1834) BP: 154/64 mmHg (02/04 1834) Pulse Rate: 90  (02/04 1834) Intake/Output from previous day:   Intake/Output from this shift:    Labs:  Basename 01/01/13 1229  WBC 13.1*  HGB 7.6*  PLT 314  LABCREA --  CREATININE 0.78   Estimated Creatinine Clearance: 65.3 ml/min (by C-G formula based on Cr of 0.78). No results found for this basename: VANCOTROUGH:2,VANCOPEAK:2,VANCORANDOM:2,GENTTROUGH:2,GENTPEAK:2,GENTRANDOM:2,TOBRATROUGH:2,TOBRAPEAK:2,TOBRARND:2,AMIKACINPEAK:2,AMIKACINTROU:2,AMIKACIN:2, in the last 72 hours   Microbiology: No results found for this or any previous visit (from the past 720 hour(s)).  Medical History: Past Medical History  Diagnosis Date  . COPD (chronic obstructive pulmonary disease)   . Hypertension   . Asthma   . Psoriatic arthritis   . Psoriasis   . GERD (gastroesophageal reflux disease)   . Hiatal hernia   . Chronic sinus infection   . Heart murmur   . Blood transfusion ~ 1962    "11 units of blood"  . Ankylosing spondylitis   . Chronic back pain   . Dysrhythmia     "palpitations"  . Esophageal dilatation   . Candida esophagitis   . Cirrhosis of liver without mention of alcohol   . Hypercholesteremia   . Peripheral vascular disease   . Exertional dyspnea   . Pneumonia 10/07/11; 2006; 1980's  . Chronic bronchitis     "since childhood" (12/06/2012)  . Type II diabetes mellitus   . Iron deficiency anemia   . External bleeding  hemorrhoids   . Sinus headache   . Arthritis     "psorasic"  . OA (osteoarthritis)   . DJD (degenerative joint disease) of knee     "both" (12/06/2012)  . Carpal tunnel syndrome on right   . Anxiety   . Barrett esophagus   . Non-healing ulcer of foot, secondary to diabetes and PVD 12/07/2012  . PVD (peripheral vascular disease) 12/07/2012  . Tobacco abuse 12/07/2012  . Critical lower limb ischemia, with ulcer Lt foot 12/07/2012    Medications:  Prescriptions prior to admission  Medication Sig Dispense Refill  . acetaminophen (TYLENOL) 325 MG tablet Take 2 tablets (650 mg total) by mouth every 4 (four) hours as needed.      Marland Kitchen albuterol (PROVENTIL HFA;VENTOLIN HFA) 108 (90 BASE) MCG/ACT inhaler Inhale 1 puff into the lungs every 6 (six) hours as needed. FOR SHORTNESS OF BREATH      . ammonium lactate (LAC-HYDRIN) 12 % lotion Apply 1 application topically daily as needed. For skin condition.      Marland Kitchen aspirin EC 81 MG tablet Take 81 mg by mouth daily.      . Clobetasol Propionate (TEMOVATE) 0.05 % external spray Apply 1 spray topically 2 (two) times daily as needed. For psoriasis flare-ups      . clopidogrel (PLAVIX) 75 MG tablet Take 1 tablet (75 mg total) by mouth daily with breakfast.  30 tablet  11  . collagenase (SANTYL) ointment Apply topically daily. Apply to affected area Lt. Foot daily  15 g    .  esomeprazole (NEXIUM) 40 MG capsule Take 40 mg by mouth daily before breakfast.      . fluticasone (FLONASE) 50 MCG/ACT nasal spray Place 2 sprays into the nose daily as needed. For allergies      . Fluticasone-Salmeterol (ADVAIR) 250-50 MCG/DOSE AEPB Inhale 1 puff into the lungs every 12 (twelve) hours.        Marland Kitchen glyBURIDE-metformin (GLUCOVANCE) 2.5-500 MG per tablet Take 1 tablet by mouth daily with breakfast.      . HYDROcodone-acetaminophen (NORCO/VICODIN) 5-325 MG per tablet Take 1 tablet by mouth every 4 (four) hours as needed for pain.  30 tablet  0  . Multiple Vitamins-Minerals (EYE  VITAMINS) CAPS Take 1 capsule by mouth daily.      Marland Kitchen olmesartan-hydrochlorothiazide (BENICAR HCT) 40-12.5 MG per tablet Take 1 tablet by mouth every morning.       . predniSONE (DELTASONE) 5 MG tablet Take 5 mg by mouth daily. On Monday, Wednesday, Friday       . ranitidine (ZANTAC) 150 MG capsule Take 150 mg by mouth at bedtime.        . Simethicone (MYLANTA GAS PO) Take 1 tablet by mouth daily as needed. For gas      . theophylline (THEODUR) 200 MG 12 hr tablet Take 200 mg by mouth 2 (two) times daily.        . Travoprost, BAK Free, (TRAVATAMN) 0.004 % SOLN ophthalmic solution Place 1 drop into both eyes at bedtime.       Marland Kitchen olmesartan-hydrochlorothiazide (BENICAR HCT) 20-12.5 MG per tablet Take 1 tablet by mouth at bedtime.       Assessment: 72 y/o female who presented to the ED with left foot pain, weakness and chills. She has PVD and is s/p left SFA stenting in January 2014. Pharmacy consulted to begin vancomycin for LLE cellulitis. Renal function is normal, WBC are elevated, and blood cultures are pending.  Goal of Therapy:  Vancomycin trough level 10-15 mcg/ml  Plan:  -Vancomycin 1000 mg IV q12h -Monitor renal function and clinical course  [facility], [address]D., BCPS Clinical Pharmacist Pager: [phone] 01/01/2013 7:06 PM

## 2013-01-01 NOTE — ED Notes (Signed)
Lactic acid results shown to Dr. Lockwood 

## 2013-01-01 NOTE — ED Notes (Signed)
Per EMS- pt has been having fatigue and rash since discharge for hospital on the 15th of January. Pt also reports increased swelling in her legs. And increased pain in left foot where she has pressure ulcers.

## 2013-01-02 ENCOUNTER — Encounter (HOSPITAL_COMMUNITY): Payer: Self-pay | Admitting: Physician Assistant

## 2013-01-02 DIAGNOSIS — D509 Iron deficiency anemia, unspecified: Secondary | ICD-10-CM

## 2013-01-02 DIAGNOSIS — L0291 Cutaneous abscess, unspecified: Secondary | ICD-10-CM

## 2013-01-02 DIAGNOSIS — R21 Rash and other nonspecific skin eruption: Secondary | ICD-10-CM | POA: Diagnosis present

## 2013-01-02 DIAGNOSIS — L409 Psoriasis, unspecified: Secondary | ICD-10-CM | POA: Insufficient documentation

## 2013-01-02 DIAGNOSIS — M109 Gout, unspecified: Secondary | ICD-10-CM | POA: Diagnosis present

## 2013-01-02 DIAGNOSIS — L0390 Cellulitis, unspecified: Secondary | ICD-10-CM

## 2013-01-02 LAB — URINALYSIS, ROUTINE W REFLEX MICROSCOPIC
Hgb urine dipstick: NEGATIVE
Specific Gravity, Urine: 1.018 (ref 1.005–1.030)
Urobilinogen, UA: 1 mg/dL (ref 0.0–1.0)
pH: 5.5 (ref 5.0–8.0)

## 2013-01-02 LAB — FOLATE: Folate: 15.5 ng/mL

## 2013-01-02 LAB — GLUCOSE, CAPILLARY
Glucose-Capillary: 281 mg/dL — ABNORMAL HIGH (ref 70–99)
Glucose-Capillary: 246 mg/dL — ABNORMAL HIGH (ref 70–99)

## 2013-01-02 LAB — TROPONIN I: Troponin I: <0.3 ng/mL (ref <0.30)

## 2013-01-02 LAB — FERRITIN: Ferritin: 32 ng/mL (ref 10–291)

## 2013-01-02 LAB — IRON AND TIBC: UIBC: 268 ug/dL (ref 125–400)

## 2013-01-02 MED ORDER — INSULIN GLARGINE 100 UNIT/ML ~~LOC~~ SOLN
25.0000 [IU] | Freq: Every day | SUBCUTANEOUS | Status: DC
  Administered 2013-01-02: 25 [IU] via SUBCUTANEOUS

## 2013-01-02 MED ORDER — LIDOCAINE HCL 1 % IJ SOLN: 10.0000 mL | INTRAMUSCULAR | Status: DC

## 2013-01-02 MED ORDER — LORAZEPAM 0.5 MG PO TABS
0.5000 mg | ORAL_TABLET | Freq: Two times a day (BID) | ORAL | Status: DC | PRN
  Administered 2013-01-02 – 2013-01-09 (×8): 0.5 mg via ORAL
  Filled 2013-01-02 (×8): qty 1

## 2013-01-02 MED ORDER — FLUOCINONIDE-E 0.05 % EX CREA
TOPICAL_CREAM | Freq: Two times a day (BID) | CUTANEOUS | Status: DC
  Administered 2013-01-02 – 2013-01-04 (×5): via TOPICAL
  Filled 2013-01-02: qty 15

## 2013-01-02 MED ORDER — INSULIN ASPART 100 UNIT/ML ~~LOC~~ SOLN
0.0000 [IU] | Freq: Three times a day (TID) | SUBCUTANEOUS | Status: DC
  Administered 2013-01-03: 5 [IU] via SUBCUTANEOUS

## 2013-01-02 MED ORDER — INSULIN GLARGINE 100 UNIT/ML ~~LOC~~ SOLN: 15.0000 [IU] | Freq: Every day | SUBCUTANEOUS | Status: DC

## 2013-01-02 MED ORDER — METHYLPREDNISOLONE ACETATE 80 MG/ML IJ SUSP
80.0000 mg | INTRAMUSCULAR | Status: AC
  Administered 2013-01-02: 80 mg via INTRAMUSCULAR
  Filled 2013-01-02: qty 2

## 2013-01-02 MED ORDER — SODIUM CHLORIDE 0.9 % IV SOLN
25.0000 mg | Freq: Once | INTRAVENOUS | Status: AC
  Administered 2013-01-02: 25 mg via INTRAVENOUS
  Filled 2013-01-02: qty 0.5

## 2013-01-02 MED ORDER — VANCOMYCIN HCL 10 G IV SOLR
1500.0000 mg | INTRAVENOUS | Status: DC
  Administered 2013-01-02 – 2013-01-04 (×3): 1500 mg via INTRAVENOUS
  Filled 2013-01-02 (×4): qty 1500

## 2013-01-02 MED ORDER — FERROUS SULFATE 325 (65 FE) MG PO TABS
325.0000 mg | ORAL_TABLET | Freq: Two times a day (BID) | ORAL | Status: DC
  Administered 2013-01-03 – 2013-01-05 (×5): 325 mg via ORAL
  Filled 2013-01-02 (×7): qty 1

## 2013-01-02 MED ORDER — IRON DEXTRAN 50 MG/ML IJ SOLN
500.0000 mg | Freq: Once | INTRAMUSCULAR | Status: AC
  Administered 2013-01-02: 500 mg via INTRAVENOUS
  Filled 2013-01-02: qty 10

## 2013-01-02 MED ORDER — LIDOCAINE HCL (PF) 1 % IJ SOLN
10.0000 mL | Freq: Once | INTRAMUSCULAR | Status: AC
  Administered 2013-01-02: 10 mL via INTRADERMAL
  Filled 2013-01-02: qty 10

## 2013-01-02 NOTE — Progress Notes (Signed)
Inpatient Diabetes Program Recommendations  AACE/ADA: New Consensus Statement on Inpatient Glycemic Control (2013)  Target Ranges:  Prepandial:   less than 140 mg/dL      Peak postprandial:   less than 180 mg/dL (1-2 hours)      Critically ill patients:  140 - 180 mg/dL   Results for [name], [name] (MRN [id number]) as of 01/02/2013 12:32  Ref. Range 01/01/2013 17:17 01/01/2013 21:41 01/02/2013 07:45 01/02/2013 12:08  Glucose-Capillary Latest Range: 70-99 mg/dL 409 (H) 811 (H) 914 (H) 281 (H)    Inpatient Diabetes Program Recommendations Insulin - Basal: Please consider increasing Lantus to 15 units QHS. Correction (SSI): Please consider increasing Novolog correction to moderate scale and adding HS coverage. A1C:  Please consider ordering an A1C to determine glycemic control over the past 2-3 months.  Note: Blood glucose has ranged from 202-320 mg/dl over the past 21 hours.  Currently patient is ordered to receive Lantus 10 units QHS and Novolog sensitive correction AC for inpatient glycemic control.  Please consider increasing Lantus to 15 units QHS, increasing Novolog correction to moderate scale, and ordering an A1C.  The last A1C in the chart was 6.7% on 08/05/2009.  Will continue to follow.  Thanks, Orlando Penner, RN, BSN, CCRN Diabetes Coordinator Inpatient Diabetes Program [phone]

## 2013-01-02 NOTE — Progress Notes (Signed)
UR chart review completed.  

## 2013-01-02 NOTE — Consult Note (Addendum)
WOC consult Note Reason for Consult: Consult requested for left foot wounds.  Pt followed by [facility] service and has severe insufficiency to BLE according to progress notes. Topical care will be minimally effective for healing to affected areas.  Left foot with dry eschar to toes. No odor or drainage.   Wound type: Left outer foot with full thickness wound, 2X1.5X.1cm, inner foot near great toe 5X3X.1cm.  Both sites 100% dry yellow slough. Dressing procedure/placement/frequency:Vaseline gauze to prevent dressings for adhering and protect from further injury.  Pt needs to continue to be followed by [facility], as she is high risk further necrosis.   Pt c/o pain to right heel.  Small partial thickness crack noted .1X.1X.1cm over dry peeling skin site. Bilat heels tender and fluctuant.  No open wounds noted. Protected both heels with foam dressings.  Float to reduce pressure. Will not plan to follow further unless re-consulted.  [address], RN, MSN, [employer]  [phone]

## 2013-01-02 NOTE — Progress Notes (Addendum)
-   Agree with plan.  - Spoke with ortho they will defer necrotic foot to cardiovascular. - Ortho consulted for aspiration of knee possible gout vs infectious etiology. - Rash  2/2 to psoriasis on steroids. - replete iron IV.

## 2013-01-02 NOTE — Consult Note (Signed)
Reason for Consult:Left knee pain/swelling Referring Physician: Robb Matar  HPI: [name] is an 72 y.o. female with a complicated medical history who recently underwent a revascularization on the LLE in January readmitted with worsening left foot pain and general malaise. She has had multiple arthralgias and myalgias and we were consulted for possible "tap" of her left knee due to several days of left knee pain and swelling. She has not had a septic picture and has an extensive orthopaedic history of osteoarthritis, ankylosing spondylitis and has been followed by Dr. Dorene Grebe for OA of her L knee. She has received previous corticosteroid injections and several series of viscosupplementation to this left knee  Which typically gives her symptommatic relief. She has had signicant swelling and pain to this left knee over the past 48-72 hrs but reports improvement in swelling as of today.  Past Medical History  Diagnosis Date  . COPD (chronic obstructive pulmonary disease)   . Hypertension   . Asthma   . Psoriatic arthritis   . Psoriasis   . GERD (gastroesophageal reflux disease)   . Hiatal hernia   . Chronic sinus infection   . Heart murmur   . Blood transfusion ~ 1962    "11 units of blood"  . Ankylosing spondylitis   . Chronic back pain   . Dysrhythmia     "palpitations"  . Esophageal dilatation   . Candida esophagitis   . Cirrhosis of liver without mention of alcohol   . Hypercholesteremia   . Peripheral vascular disease   . Exertional dyspnea   . Pneumonia 10/07/11; 2006; 1980's  . Chronic bronchitis     "since childhood" (12/06/2012)  . Type II diabetes mellitus   . Iron deficiency anemia   . External bleeding hemorrhoids   . Sinus headache   . Arthritis     "psorasic"  . OA (osteoarthritis)   . DJD (degenerative joint disease) of knee     "both" (12/06/2012)  . Carpal tunnel syndrome on right   . Anxiety   . Barrett esophagus   . Non-healing ulcer of foot, secondary to  diabetes and PVD 12/07/2012  . PVD (peripheral vascular disease) 12/07/2012  . Tobacco abuse 12/07/2012  . Critical lower limb ischemia, with ulcer Lt foot 12/07/2012  . Gout   . Psoriasis (a type of skin inflammation)   . COPD (chronic obstructive pulmonary disease)     Past Surgical History  Procedure Date  . Nasal sinus surgery 09/29/2009  . Carpal tunnel release 2011    left hand  . Lipoma excision 2010    "left occipital area"  . Cataract extraction w/ intraocular lens implant 2009/2010    right/left  . Eye surgery   . Oophorectomy     'not sure which one"  . Appendectomy   . Angioplasty 12/06/2012    "LLE"  . Abdominal hysterectomy 1976  . Breast biopsy 1960's    "think he did both" (12/06/2012)    Family History  Problem Relation Age of Onset  . Lupus Sister   . Lung cancer Sister   . Thyroid disease Sister   . Colon cancer Neg Hx     Social History:  reports that she has been smoking Cigarettes.  She has a 40 pack-year smoking history. She has never used smokeless tobacco. She reports that she does not drink alcohol or use illicit drugs.  Allergies:  Allergies  Allergen Reactions  . Amlodipine Besy-Benazepril Hcl Other (See Comments)    Nervous/shakiness  .  Statins Other (See Comments)    "can't take any of them; cramp me up" (12/06/2012)  . Raptiva (Efalizumab) Rash    Medications: I have reviewed the patient's current medications.  Results for orders placed during the hospital encounter of 01/01/13 (from the past 48 hour(s))  CBC WITH DIFFERENTIAL     Status: Abnormal   Collection Time   01/01/13 12:29 PM      Component Value Range Comment   WBC 13.1 (*) 4.0 - 10.5 K/uL    RBC 2.92 (*) 3.87 - 5.11 MIL/uL    Hemoglobin 7.6 (*) 12.0 - 15.0 g/dL    HCT 98.1 (*) 19.1 - 46.0 %    MCV 87.3  78.0 - 100.0 fL    MCH 26.0  26.0 - 34.0 pg    MCHC 29.8 (*) 30.0 - 36.0 g/dL    RDW 47.8 (*) 29.5 - 15.5 %    Platelets 314  150 - 400 K/uL    Neutrophils Relative 79 (*)  43 - 77 %    Neutro Abs 10.4 (*) 1.7 - 7.7 K/uL    Lymphocytes Relative 9 (*) 12 - 46 %    Lymphs Abs 1.2  0.7 - 4.0 K/uL    Monocytes Relative 9  3 - 12 %    Monocytes Absolute 1.2 (*) 0.1 - 1.0 K/uL    Eosinophils Relative 2  0 - 5 %    Eosinophils Absolute 0.2  0.0 - 0.7 K/uL    Basophils Relative 1  0 - 1 %    Basophils Absolute 0.1  0.0 - 0.1 K/uL   COMPREHENSIVE METABOLIC PANEL     Status: Abnormal   Collection Time   01/01/13 12:29 PM      Component Value Range Comment   Sodium 135  135 - 145 mEq/L    Potassium 4.1  3.5 - 5.1 mEq/L    Chloride 101  96 - 112 mEq/L    CO2 22  19 - 32 mEq/L    Glucose, Bld 212 (*) 70 - 99 mg/dL    BUN 15  6 - 23 mg/dL    Creatinine, Ser 6.21  0.50 - 1.10 mg/dL    Calcium 9.3  8.4 - 30.8 mg/dL    Total Protein 6.7  6.0 - 8.3 g/dL    Albumin 2.6 (*) 3.5 - 5.2 g/dL    AST 25  0 - 37 U/L    ALT 35  0 - 35 U/L    Alkaline Phosphatase 112  39 - 117 U/L    Total Bilirubin 0.4  0.3 - 1.2 mg/dL    GFR calc non Af Amer 82 (*) >90 mL/min    GFR calc Af Amer >90  >90 mL/min   CG4 I-STAT (LACTIC ACID)     Status: Normal   Collection Time   01/01/13 12:53 PM      Component Value Range Comment   Lactic Acid, Venous 1.59  0.5 - 2.2 mmol/L   GLUCOSE, CAPILLARY     Status: Abnormal   Collection Time   01/01/13  5:17 PM      Component Value Range Comment   Glucose-Capillary 202 (*) 70 - 99 mg/dL   VITAMIN M57     Status: Abnormal   Collection Time   01/01/13  5:41 PM      Component Value Range Comment   Vitamin B-12 1334 (*) 211 - 911 pg/mL   FOLATE     Status: Normal  Collection Time   01/01/13  5:41 PM      Component Value Range Comment   Folate 15.5     IRON AND TIBC     Status: Abnormal   Collection Time   01/01/13  5:41 PM      Component Value Range Comment   Iron 17 (*) 42 - 135 ug/dL    TIBC 914  782 - 956 ug/dL    Saturation Ratios 6 (*) 20 - 55 %    UIBC 268  125 - 400 ug/dL   FERRITIN     Status: Normal   Collection Time   01/01/13  5:41 PM       Component Value Range Comment   Ferritin 32  10 - 291 ng/mL   RETICULOCYTES     Status: Abnormal   Collection Time   01/01/13  5:41 PM      Component Value Range Comment   Retic Ct Pct 6.5 (*) 0.4 - 3.1 %    RBC. 2.80 (*) 3.87 - 5.11 MIL/uL    Retic Count, Manual 182.0  19.0 - 186.0 K/uL   TROPONIN I     Status: Normal   Collection Time   01/01/13  5:43 PM      Component Value Range Comment   Troponin I <0.30  <0.30 ng/mL   CULTURE, BLOOD (ROUTINE X 2)     Status: Normal (Preliminary result)   Collection Time   01/01/13  5:45 PM      Component Value Range Comment   Specimen Description BLOOD ARM LEFT      Special Requests BOTTLES DRAWN AEROBIC AND ANAEROBIC 10CC      Culture  Setup Time 01/01/2013 23:47      Culture        Value:        BLOOD CULTURE RECEIVED NO GROWTH TO DATE CULTURE WILL BE HELD FOR 5 DAYS BEFORE ISSUING A FINAL NEGATIVE REPORT   Report Status PENDING     CULTURE, BLOOD (ROUTINE X 2)     Status: Normal (Preliminary result)   Collection Time   01/01/13  6:00 PM      Component Value Range Comment   Specimen Description BLOOD HAND LEFT      Special Requests BOTTLES DRAWN AEROBIC ONLY 10CC      Culture  Setup Time 01/01/2013 23:47      Culture        Value:        BLOOD CULTURE RECEIVED NO GROWTH TO DATE CULTURE WILL BE HELD FOR 5 DAYS BEFORE ISSUING A FINAL NEGATIVE REPORT   Report Status PENDING     CBC     Status: Abnormal   Collection Time   01/01/13  9:13 PM      Component Value Range Comment   WBC 12.4 (*) 4.0 - 10.5 K/uL    RBC 3.01 (*) 3.87 - 5.11 MIL/uL    Hemoglobin 8.0 (*) 12.0 - 15.0 g/dL    HCT 21.3 (*) 08.6 - 46.0 %    MCV 87.0  78.0 - 100.0 fL    MCH 26.6  26.0 - 34.0 pg    MCHC 30.5  30.0 - 36.0 g/dL    RDW 57.8 (*) 46.9 - 15.5 %    Platelets 323  150 - 400 K/uL   CREATININE, SERUM     Status: Abnormal   Collection Time   01/01/13  9:13 PM      Component Value Range Comment  Creatinine, Ser 0.90  0.50 - 1.10 mg/dL    GFR calc non Af Amer  62 (*) >90 mL/min    GFR calc Af Amer 72 (*) >90 mL/min   GLUCOSE, CAPILLARY     Status: Abnormal   Collection Time   01/01/13  9:41 PM      Component Value Range Comment   Glucose-Capillary 320 (*) 70 - 99 mg/dL   TROPONIN I     Status: Normal   Collection Time   01/01/13 11:26 PM      Component Value Range Comment   Troponin I <0.30  <0.30 ng/mL   URINALYSIS, ROUTINE W REFLEX MICROSCOPIC     Status: Abnormal   Collection Time   01/02/13 12:59 AM      Component Value Range Comment   Color, Urine YELLOW  YELLOW    APPearance CLOUDY (*) CLEAR    Specific Gravity, Urine 1.018  1.005 - 1.030    pH 5.5  5.0 - 8.0    Glucose, UA 250 (*) NEGATIVE mg/dL    Hgb urine dipstick NEGATIVE  NEGATIVE    Bilirubin Urine NEGATIVE  NEGATIVE    Ketones, ur NEGATIVE  NEGATIVE mg/dL    Protein, ur NEGATIVE  NEGATIVE mg/dL    Urobilinogen, UA 1.0  0.0 - 1.0 mg/dL    Nitrite NEGATIVE  NEGATIVE    Leukocytes, UA NEGATIVE  NEGATIVE MICROSCOPIC NOT DONE ON URINES WITH NEGATIVE PROTEIN, BLOOD, LEUKOCYTES, NITRITE, OR GLUCOSE <1000 mg/dL.  TROPONIN I     Status: Normal   Collection Time   01/02/13  6:22 AM      Component Value Range Comment   Troponin I <0.30  <0.30 ng/mL   URIC ACID     Status: Abnormal   Collection Time   01/02/13  6:22 AM      Component Value Range Comment   Uric Acid, Serum 8.4 (*) 2.4 - 7.0 mg/dL   GLUCOSE, CAPILLARY     Status: Abnormal   Collection Time   01/02/13  7:45 AM      Component Value Range Comment   Glucose-Capillary 252 (*) 70 - 99 mg/dL    Comment 1 Documented in Chart      Comment 2 Notify RN     GLUCOSE, CAPILLARY     Status: Abnormal   Collection Time   01/02/13 12:08 PM      Component Value Range Comment   Glucose-Capillary 281 (*) 70 - 99 mg/dL    Comment 1 Documented in Chart      Comment 2 Notify RN       Dg Chest 2 View  01/01/2013  *RADIOLOGY REPORT*  Clinical Data: COPD  CHEST - 2 VIEW  Comparison: Chest radiograph 12/03/2012  Findings: Normal mediastinum  and heart silhouette.  Costophrenic angles are clear.  No effusion, infiltrate, or pneumothorax. Degenerative changes at the right shoulder are stable. Degenerative osteophytosis of the thoracic spine.  IMPRESSION: No acute cardiopulmonary process.   Original Report Authenticated By: Genevive Bi, M.D.    Dg Foot Complete Left  01/01/2013  *RADIOLOGY REPORT*  Clinical Data: Pressure ulcers left foot  LEFT FOOT - COMPLETE 3+ VIEW  Comparison: None.  Findings: There is  skin irregularity adjacent to the first metatarsal.  There is no subcutaneous gas.  There is no evidence of cortical destruction to suggest active osteomyelitis.  Mild osteopenia.  IMPRESSION: 1.  No radiographic evidence of osteomyelitis. 2.  Soft tissue deformity  adjacent to  the first metatarsal.   Original Report Authenticated By: Genevive Bi, M.D.     ROS: per history  Physical Exam:  Her left knee is examined which shows a painful arc of motion. She is stable on clinical exam and has no obvious effusion or erythema to suggest a septic joint. She is diffusely tender to palpation along the medial and lateral joint line. She also has chonic skin changes c/w chronic vascular disease. Vitals Temp:  [97.8 F (36.6 C)-98.5 F (36.9 C)] 97.8 F (36.6 C) (02/05 1440) Pulse Rate:  [76-91] 76  (02/05 1440) Resp:  [18-22] 20  (02/05 1440) BP: (128-154)/(64-95) 148/72 mmHg (02/05 1440) SpO2:  [93 %-100 %] 98 % (02/05 1440) Weight:  [80.7 kg (177 lb 14.6 oz)-80.74 kg (178 lb)] 80.74 kg (178 lb) (02/05 0513) Body mass index is 30.55 kg/(m^2).  Assessment/Plan: Impression: Exacerbation of underlying OA Treatment: In the absence of an effusion it is unlikely a tap would yield anything nor change her treatment options given her previous history and her current clinical exam. However she would likely benefit from a steroid injection to help with her pain control and mobility. She wishes to proceed  Utilizing a sterile prep a  combination of 8cc 1%plain xylocaine along with 80mg  of depomedrol are instilled into the left knee and she tolerated this well.  Recommend Ice and prn analgesics Follow up with her regular orthopaedist Dr. Dorene Grebe for ongoing care as an outpatient  [facility] for Dr. Francena Hanly 01/02/2013, 4:38 PM

## 2013-01-02 NOTE — Progress Notes (Signed)
[facility]  Patient Status: Active (receiving services up to time of hospitalization)  [facility] is providing the following services: RN and PT  If patient discharges after hours, please call [phone].   [name] 01/02/2013, 12:47 PM

## 2013-01-02 NOTE — Progress Notes (Signed)
ANTIBIOTIC CONSULT NOTE - FOLLOW UP  Pharmacy Consult for Vancomycin Indication: LLE cellulitis/gangrene of L toes  Allergies  Allergen Reactions  . Amlodipine Besy-Benazepril Hcl Other (See Comments)    Nervous/shakiness  . Statins Other (See Comments)    "can't take any of them; cramp me up" (12/06/2012)  . Raptiva (Efalizumab) Rash    Patient Measurements: Height: 5\' 4"  (162.6 cm) Weight: 178 lb (80.74 kg) IBW/kg (Calculated) : 54.7   Vital Signs: Temp: 98.5 F (36.9 C) (02/05 0513) Temp src: Oral (02/05 0513) BP: 129/72 mmHg (02/05 0513) Pulse Rate: 76  (02/05 0513) Intake/Output from previous day: 02/04 0701 - 02/05 0700 In: -  Out: 250 [Urine:250] Intake/Output from this shift:    Labs:  [facility] 01/01/13 2113 01/01/13 1229  WBC 12.4* 13.1*  HGB 8.0* 7.6*  PLT 323 314  LABCREA -- --  CREATININE 0.90 0.78   Estimated Creatinine Clearance: 58.1 ml/min (by C-G formula based on Cr of 0.9). No results found for this basename: VANCOTROUGH:2,VANCOPEAK:2,VANCORANDOM:2,GENTTROUGH:2,GENTPEAK:2,GENTRANDOM:2,TOBRATROUGH:2,TOBRAPEAK:2,TOBRARND:2,AMIKACINPEAK:2,AMIKACINTROU:2,AMIKACIN:2, in the last 72 hours   Microbiology: Recent Results (from the past 720 hour(s))  CULTURE, BLOOD (ROUTINE X 2)     Status: Normal (Preliminary result)   Collection Time   01/01/13  5:45 PM      Component Value Range Status Comment   Specimen Description BLOOD ARM LEFT   Final    Special Requests BOTTLES DRAWN AEROBIC AND ANAEROBIC 10CC   Final    Culture  Setup Time 01/01/2013 23:47   Final    Culture     Final    Value:        BLOOD CULTURE RECEIVED NO GROWTH TO DATE CULTURE WILL BE HELD FOR 5 DAYS BEFORE ISSUING A FINAL NEGATIVE REPORT   Report Status PENDING   Incomplete   CULTURE, BLOOD (ROUTINE X 2)     Status: Normal (Preliminary result)   Collection Time   01/01/13  6:00 PM      Component Value Range Status Comment   Specimen Description BLOOD HAND LEFT   Final    Special Requests  BOTTLES DRAWN AEROBIC ONLY 10CC   Final    Culture  Setup Time 01/01/2013 23:47   Final    Culture     Final    Value:        BLOOD CULTURE RECEIVED NO GROWTH TO DATE CULTURE WILL BE HELD FOR 5 DAYS BEFORE ISSUING A FINAL NEGATIVE REPORT   Report Status PENDING   Incomplete     Anti-infectives     Start     Dose/Rate Route Frequency Ordered Stop   01/01/13 2000   vancomycin (VANCOCIN) IVPB 1000 mg/200 mL premix        1,000 mg 200 mL/hr over 60 Minutes Intravenous Every 12 hours 01/01/13 1910     01/01/13 1500   clindamycin (CLEOCIN) IVPB 900 mg        900 mg 100 mL/hr over 30 Minutes Intravenous  Once 01/01/13 1446 01/01/13 1547          Assessment: 72 y.o. F with hx PVD (recent atherectomy and stenting of the mid-left SFA earlier this month), and LLE foot ulcers started on Vancomycin on 01/01/13 for empiric cellulitis coverage. Noted to have some gangrene toes. X-rays show no osteo. Will adjust dose slightly today to achieve target range of 10-15 mcg/ml. SCr 0.9, CrCl~55-60 ml/min.   Goal of Therapy:  Vancomycin trough level 10-15 mcg/ml  Plan:  1. Adjust Vancomycin to 1500 mg IV every  24 hours 2. Will continue to follow renal function, culture results, LOT, and antibiotic de-escalation plans   Georgina Pillion, PharmD, BCPS Clinical Pharmacist Pager: [phone] 01/02/2013 10:17 AM

## 2013-01-02 NOTE — Progress Notes (Signed)
[facility] PROGRESS NOTE  [name] JXB:[id number] DOB: December 07, 1940 DOA: 01/01/2013 PCP: [name], MD  Assessment/Plan:  Left leg cellulitis Leg swollen with chronic wounds. On Vanc / Clindamycin Blood Cultures pending Low grade fever, WBC 12.4  Necrosis of the 1st and 2nd digit and LLE with non-healing wounds Significant pain in LLE Recent SFA stent 12/10/12.   On Plavix and 81 mg ASA Requested Orthopedic consultation.  Possible gout in left knee No previous diagnosis of gout. Elevated uric acid level Swollen exquisitely tender knee Started on Prednisone 40 mg at admission Will request Orthopedic consult to examine / obtain fluid sample / and offer recs as they deem appropriate.  Skin Rash in the setting of psoriasis Possibly psoriasis flare vs drug reaction. Significant rash found on her back, legs, arms, abdomen, chest and parts of her face. Will use prednisone and Lidex emollient cream Sees Dr. [name] at [facility]  Anemia Baseline difficult to discern ? 9.0 On plavix and 81 mg asa Low iron, low iron saturation On Iron supplementation (with stool softeners) Will monitor in house.  Transfuse if she drops to 7.0 or if we believe she is symptomatic from the anemia.  COPD stable Wheeze bilaterally on exam (she reports this is chronic) On home medication regimen (albuterol inh, fluticasone, Advair, theophylline)  DM 2 Will change regimen to lantus 15 unit, SS-Moderate and meal coverage Check Hgb A1c Normally on glyburide at home.  HTN Avapro HCTZ Moderate control.  Possibly elevated due to anxiety and pain.  Hx of Barretts Esophagus On pepcid  Anxiety Low dose ativan PRN  Code Status: Full Family Communication: Disposition Plan: to be determined (home vs SNF) when appropriate.   Consultants:  Cardiology [facility], Dr. Allyson Sabal  Orthopedic Surgery, Dr. Rennis Chris  HPI/Subjective: Multiple complaints: Left foot pain,  left knee pain and swelling with swelling extending up into her thigh; pervasive rash with tenderness and puritis;  Chills; tremor; anxiety.  Objective: Filed Vitals:   01/01/13 1955 01/02/13 0513 01/02/13 0951 01/02/13 1210  BP: 128/78 129/72  152/71  Pulse: 91 76  87  Temp: 98.2 F (36.8 C) 98.5 F (36.9 C)    TempSrc: Oral Oral    Resp: 18 22    Height:      Weight:  80.74 kg (178 lb)    SpO2: 100% 96% 97% 98%    Intake/Output Summary (Last 24 hours) at 01/02/13 1303 Last data filed at 01/02/13 0102  Gross per 24 hour  Intake      0 ml  Output    250 ml  Net   -250 ml   Filed Weights   01/01/13 1834 01/02/13 0513  Weight: 80.7 kg (177 lb 14.6 oz) 80.74 kg (178 lb)    Exam:   General:  WD AA female, sitting in chair, with generalized tremor.  Cardiovascular: rrr, no m/r/g  Respiratory: cta, no w/c/r  Abdomen: soft, nt, nd, +BS  Skin:  Pervasive body rash red macules, confluent over her lower back, also sporadically located on her face, arms and abdomen.  Legs are covered with scale,  Skin shedding on bed sheet and floor  Extremitites:  Extensive scaling of the skin.  Left knee edematous and extremely tender to touch.  Left foot wrapped in gauze, first two digits are blackened.  Patient will not let me lift her leg to examine it (it is too painful).   Data Reviewed: Basic Metabolic Panel:  Lab 01/01/13 1308 01/01/13 1229  NA --  135  K -- 4.1  CL -- 101  CO2 -- 22  GLUCOSE -- 212*  BUN -- 15  CREATININE 0.90 0.78  CALCIUM -- 9.3  MG -- --  PHOS -- --   Liver Function Tests:  Lab 01/01/13 1229  AST 25  ALT 35  ALKPHOS 112  BILITOT 0.4  PROT 6.7  ALBUMIN 2.6*   CBC:  Lab 01/01/13 2113 01/01/13 1229  WBC 12.4* 13.1*  NEUTROABS -- 10.4*  HGB 8.0* 7.6*  HCT 26.2* 25.5*  MCV 87.0 87.3  PLT 323 314   Cardiac Enzymes:  Lab 01/02/13 0622 01/01/13 2326 01/01/13 1743  CKTOTAL -- -- --  CKMB -- -- --  CKMBINDEX -- -- --  TROPONINI <0.30 <0.30  <0.30   CBG:  Lab 01/02/13 1208 01/02/13 0745 01/01/13 2141 01/01/13 1717  GLUCAP 281* 252* 320* 202*    Recent Results (from the past 240 hour(s))  CULTURE, BLOOD (ROUTINE X 2)     Status: Normal (Preliminary result)   Collection Time   01/01/13  5:45 PM      Component Value Range Status Comment   Specimen Description BLOOD ARM LEFT   Final    Special Requests BOTTLES DRAWN AEROBIC AND ANAEROBIC 10CC   Final    Culture  Setup Time 01/01/2013 23:47   Final    Culture     Final    Value:        BLOOD CULTURE RECEIVED NO GROWTH TO DATE CULTURE WILL BE HELD FOR 5 DAYS BEFORE ISSUING A FINAL NEGATIVE REPORT   Report Status PENDING   Incomplete   CULTURE, BLOOD (ROUTINE X 2)     Status: Normal (Preliminary result)   Collection Time   01/01/13  6:00 PM      Component Value Range Status Comment   Specimen Description BLOOD HAND LEFT   Final    Special Requests BOTTLES DRAWN AEROBIC ONLY 10CC   Final    Culture  Setup Time 01/01/2013 23:47   Final    Culture     Final    Value:        BLOOD CULTURE RECEIVED NO GROWTH TO DATE CULTURE WILL BE HELD FOR 5 DAYS BEFORE ISSUING A FINAL NEGATIVE REPORT   Report Status PENDING   Incomplete      Studies: Dg Chest 2 View  01/01/2013  *RADIOLOGY REPORT*  Clinical Data: COPD  CHEST - 2 VIEW  Comparison: Chest radiograph 12/03/2012  Findings: Normal mediastinum and heart silhouette.  Costophrenic angles are clear.  No effusion, infiltrate, or pneumothorax. Degenerative changes at the right shoulder are stable. Degenerative osteophytosis of the thoracic spine.  IMPRESSION: No acute cardiopulmonary process.   Original Report Authenticated By: Genevive Bi, M.D.    Dg Foot Complete Left  01/01/2013  *RADIOLOGY REPORT*  Clinical Data: Pressure ulcers left foot  LEFT FOOT - COMPLETE 3+ VIEW  Comparison: None.  Findings: There is  skin irregularity adjacent to the first metatarsal.  There is no subcutaneous gas.  There is no evidence of cortical destruction to  suggest active osteomyelitis.  Mild osteopenia.  IMPRESSION: 1.  No radiographic evidence of osteomyelitis. 2.  Soft tissue deformity  adjacent to  the first metatarsal.   Original Report Authenticated By: Genevive Bi, M.D.     Scheduled Meds:   . aspirin EC  81 mg Oral Daily  . clopidogrel  75 mg Oral Q breakfast  . docusate sodium  100 mg Oral BID  .  enoxaparin (LOVENOX) injection  40 mg Subcutaneous Q24H  . famotidine  20 mg Oral QHS  . ferrous sulfate  325 mg Oral BID AC  . fluocinonide-emollient   Topical BID  . irbesartan  300 mg Oral Daily   And  . hydrochlorothiazide  12.5 mg Oral Daily  . insulin aspart  0-9 Units Subcutaneous TID WC  . insulin glargine  10 Units Subcutaneous QHS  . iron dextran (INFED/DEXFERRUM) infusion  500 mg Intravenous Once  . mometasone-formoterol  2 puff Inhalation BID  . pantoprazole  80 mg Oral Q1200  . predniSONE  40 mg Oral Q2000  . sodium chloride  3 mL Intravenous Q12H  . theophylline  200 mg Oral BID  . Travoprost (BAK Free)  1 drop Both Eyes QHS  . vancomycin  1,500 mg Intravenous Q24H   Continuous Infusions:   Active Problems:  DIABETES-TYPE 2  HYPERTENSION  COPD  Hypothyroidism (acquired)  Critical lower limb ischemia, with ulcer Lt foot- S/P atherectomy, PTA/stenting 12/10/12  PVD, Lt SFA stenting 12/10/12  Leukocytosis  Weakness  Skin rash  Gout attack    Time spent: 60 min   Conley Canal [facility] Pager [phone]. If 8PM-8AM, contact night-coverage at [email or web address], password [facility] 01/02/2013, 1:03 PM  LOS: 1 day

## 2013-01-02 NOTE — Progress Notes (Signed)
Brief Nutrition Note:   RD pulled to chart for Malnutrition Screening Tool score of 2, pt indicated unintentional weight loss and poor po intake PTA.  Wt Readings from Last 5 Encounters:  01/02/13 178 lb (80.74 kg)  12/11/12 177 lb 14.6 oz (80.7 kg)  12/11/12 177 lb 14.6 oz (80.7 kg)  12/07/12 171 lb 15.3 oz (78 kg)  12/07/12 171 lb 15.3 oz (78 kg)   Weight is trending up from last admission.  Body mass index is 30.55 kg/(m^2). Obesity class 1.  Diet: Heart healthy.   Per notes, pt with wounds to foot, now appear to be in the healing process.   No nutrition interventions warranted at this time. Please consult as needed.   Clarene Duke RD, LDN Pager [phone] After Hours pager [phone]

## 2013-01-03 DIAGNOSIS — D72829 Elevated white blood cell count, unspecified: Secondary | ICD-10-CM

## 2013-01-03 DIAGNOSIS — L4050 Arthropathic psoriasis, unspecified: Secondary | ICD-10-CM

## 2013-01-03 LAB — CBC
Platelets: 309 10*3/uL (ref 150–400)
RDW: 19.3 % — ABNORMAL HIGH (ref 11.5–15.5)
WBC: 15.9 10*3/uL — ABNORMAL HIGH (ref 4.0–10.5)

## 2013-01-03 LAB — HEMOGLOBIN A1C
Hgb A1c MFr Bld: 7.2 % — ABNORMAL HIGH (ref <5.7)
Mean Plasma Glucose: 160 mg/dL — ABNORMAL HIGH (ref <117)

## 2013-01-03 LAB — GLUCOSE, CAPILLARY: Glucose-Capillary: 182 mg/dL — ABNORMAL HIGH (ref 70–99)

## 2013-01-03 MED ORDER — INSULIN GLARGINE 100 UNIT/ML ~~LOC~~ SOLN
40.0000 [IU] | Freq: Every day | SUBCUTANEOUS | Status: DC
  Administered 2013-01-03 – 2013-01-10 (×7): 40 [IU] via SUBCUTANEOUS

## 2013-01-03 MED ORDER — INSULIN ASPART 100 UNIT/ML ~~LOC~~ SOLN
0.0000 [IU] | Freq: Three times a day (TID) | SUBCUTANEOUS | Status: DC
  Administered 2013-01-03: 7 [IU] via SUBCUTANEOUS
  Administered 2013-01-03: 4 [IU] via SUBCUTANEOUS
  Administered 2013-01-04: 15 [IU] via SUBCUTANEOUS
  Administered 2013-01-04: 7 [IU] via SUBCUTANEOUS
  Administered 2013-01-05: 4 [IU] via SUBCUTANEOUS
  Administered 2013-01-05 – 2013-01-06 (×2): 7 [IU] via SUBCUTANEOUS
  Administered 2013-01-07: 4 [IU] via SUBCUTANEOUS
  Administered 2013-01-07: 11 [IU] via SUBCUTANEOUS
  Administered 2013-01-08: 3 [IU] via SUBCUTANEOUS
  Administered 2013-01-09 – 2013-01-10 (×3): 4 [IU] via SUBCUTANEOUS
  Administered 2013-01-10: 7 [IU] via SUBCUTANEOUS
  Administered 2013-01-10: 3 [IU] via SUBCUTANEOUS

## 2013-01-03 MED ORDER — INSULIN ASPART 100 UNIT/ML ~~LOC~~ SOLN
4.0000 [IU] | Freq: Three times a day (TID) | SUBCUTANEOUS | Status: DC
  Administered 2013-01-03 – 2013-01-11 (×15): 4 [IU] via SUBCUTANEOUS

## 2013-01-03 MED ORDER — PREDNISONE 10 MG PO TABS
30.0000 mg | ORAL_TABLET | Freq: Every day | ORAL | Status: AC
  Administered 2013-01-04 – 2013-01-05 (×2): 30 mg via ORAL
  Filled 2013-01-03 (×2): qty 1

## 2013-01-03 MED ORDER — INSULIN ASPART 100 UNIT/ML ~~LOC~~ SOLN
0.0000 [IU] | Freq: Every day | SUBCUTANEOUS | Status: DC
  Administered 2013-01-06: 2 [IU] via SUBCUTANEOUS

## 2013-01-03 NOTE — Care Management Note (Signed)
    Page 1 of 1   01/14/2013     9:52:00 AM   CARE MANAGEMENT NOTE 01/14/2013  Patient:  [name], [name]   Account Number:  [id number]  Date Initiated:  01/03/2013  Documentation initiated by:  Letha Cape  Subjective/Objective Assessment:   dx copd  admit- lives with family     Action/Plan:   Anticipated DC Date:  01/09/2013   Anticipated DC Plan:  SKILLED NURSING FACILITY  In-house referral  Clinical Social Worker      DC Planning Services  CM consult      Choice offered to / List presented to:             Status of service:  Completed, signed off Medicare Important Message given?   (If response is "NO", the following Medicare IM given date fields will be blank) Date Medicare IM given:   Date Additional Medicare IM given:    Discharge Disposition:  SKILLED NURSING FACILITY  Per UR Regulation:  Reviewed for med. necessity/level of care/duration of stay  If discussed at Long Length of Stay Meetings, dates discussed:   01/08/2013    Comments:  01/14/13 9:49 Letha Cape RN, BSN [phone] patient dc to [facility], CSW following.  01/09/12 17:47 Letha Cape RN, BSN [phone] patient had transmetatarsal surgery today, and will need pt eval, most likely plan is snf, CSW following.  01/03/13 17:10 Letha Cape RN, BSN [phone] patient lives with family.  NCM will cont to follow for dc needs.

## 2013-01-03 NOTE — Progress Notes (Signed)
[facility] PROGRESS NOTE  [name] ZOX:[id number] DOB: 1941/11/26 DOA: 01/01/2013 PCP: [name], MD  Assessment/Plan:  Left leg cellulitis: - Leg swollen with chronic wounds. - On Vanc / Clindamycin 2.4.2014 - Blood Cultures pending - afebrile WBC increasing.  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 Leukocytosis 2/2 to steroids. - Recent SFA stent 12/10/12.   - On Plavix and 81 mg ASA, appreciate cardiology assistance.  - ? How to proceed, any further procedure needed ?  Swollen left knee - No previous diagnosis of gout. received previous corticosteroid injections and several series of viscosupplementation to this left knee Which typically gives her symptommatic relief, orthopedic was consult and - In the absence of an effusion it is unlikely a tap would yield anything nor change her treatment options given her previous history and her current clinical exam. However she would likely benefit from a steroid injection  - Elevated uric acid level - Started on Prednisone 40 mg at admission.  Skin Rash in the setting of psoriasis - Possibly psoriasis flare vs drug reaction. - Significant rash found on her back, legs, arms, abdomen, chest and parts of her face, improving - Sees Dr. [name] at [facility]  Anemia - Hbg droping slowly. Type and screen. - On plavix and 81 mg asa - On Iron supplementation (with stool softeners) - Will monitor in house.  Transfuse if she drops to 7.0 or if we believe she is symptomatic from the anemia.  COPD - stable - On home medication regimen (albuterol inh, fluticasone, Advair, theophylline)  DM 2 - Will change regimen to lantus 40 unit, SS-Moderate and meal coverage - Normally on glyburide at home.  HTN - Avapro - HCTZ - Moderate control.  Possibly elevated due to anxiety and pain.  Hx of Barretts Esophagus - PPI.  Anxiety Low dose  ativan PRN  Code Status: Full Family Communication: Disposition Plan: to be determined (home vs SNF) when appropriate.   Consultants:  Cardiology [facility], Dr. Allyson Sabal  Orthopedic Surgery, Dr. Rennis Chris  HPI/Subjective: Pain resolved. With narcotics and steroids in knee.  Objective: Filed Vitals:   01/02/13 2014 01/02/13 2208 01/03/13 0436 01/03/13 0738  BP:  165/72 171/71   Pulse:  101 85   Temp:  99.6 F (37.6 C) 98.9 F (37.2 C)   TempSrc:  Oral Oral   Resp:  18 20   Height:      Weight:   81.3 kg (179 lb 3.7 oz)   SpO2: 98% 96% 93% 97%    Intake/Output Summary (Last 24 hours) at 01/03/13 1050 Last data filed at 01/03/13 0600  Gross per 24 hour  Intake    880 ml  Output    750 ml  Net    130 ml   Filed Weights   01/01/13 1834 01/02/13 0513 01/03/13 0436  Weight: 80.7 kg (177 lb 14.6 oz) 80.74 kg (178 lb) 81.3 kg (179 lb 3.7 oz)    Exam:   General:  WD AA female, sitting in chair, with generalized tremor.  Cardiovascular: rrr, no m/r/g  Respiratory: cta, no w/c/r  Abdomen: soft, nt, nd, +BS  Skin:  Pervasive body rash red macules, confluent over her lower back, also sporadically located on her face, arms and abdomen.  Legs are covered with scale,  Skin shedding on bed sheet and floor  Extremitites:  Extensive scaling of the skin.  Left knee edematous and extremely  tender to touch.  Left foot wrapped in gauze, first two digits are blackened.  Patient will not let me lift her leg to examine it (it is too painful).   Data Reviewed: Basic Metabolic Panel:  Lab 01/01/13 7253 01/01/13 1229  NA -- 135  K -- 4.1  CL -- 101  CO2 -- 22  GLUCOSE -- 212*  BUN -- 15  CREATININE 0.90 0.78  CALCIUM -- 9.3  MG -- --  PHOS -- --   Liver Function Tests:  Lab 01/01/13 1229  AST 25  ALT 35  ALKPHOS 112  BILITOT 0.4  PROT 6.7  ALBUMIN 2.6*   CBC:  Lab 01/03/13 0610 01/01/13 2113 01/01/13 1229  WBC 15.9* 12.4* 13.1*  NEUTROABS -- -- 10.4*  HGB 7.7* 8.0*  7.6*  HCT 24.7* 26.2* 25.5*  MCV 84.6 87.0 87.3  PLT 309 323 314   Cardiac Enzymes:  Lab 01/02/13 0622 01/01/13 2326 01/01/13 1743  CKTOTAL -- -- --  CKMB -- -- --  CKMBINDEX -- -- --  TROPONINI <0.30 <0.30 <0.30   CBG:  Lab 01/03/13 0800 01/02/13 2251 01/02/13 1208 01/02/13 0745 01/01/13 2141  GLUCAP 226* 246* 281* 252* 320*    Recent Results (from the past 240 hour(s))  CULTURE, BLOOD (ROUTINE X 2)     Status: Normal (Preliminary result)   Collection Time   01/01/13  5:45 PM      Component Value Range Status Comment   Specimen Description BLOOD ARM LEFT   Final    Special Requests BOTTLES DRAWN AEROBIC AND ANAEROBIC 10CC   Final    Culture  Setup Time 01/01/2013 23:47   Final    Culture     Final    Value:        BLOOD CULTURE RECEIVED NO GROWTH TO DATE CULTURE WILL BE HELD FOR 5 DAYS BEFORE ISSUING A FINAL NEGATIVE REPORT   Report Status PENDING   Incomplete   CULTURE, BLOOD (ROUTINE X 2)     Status: Normal (Preliminary result)   Collection Time   01/01/13  6:00 PM      Component Value Range Status Comment   Specimen Description BLOOD HAND LEFT   Final    Special Requests BOTTLES DRAWN AEROBIC ONLY 10CC   Final    Culture  Setup Time 01/01/2013 23:47   Final    Culture     Final    Value:        BLOOD CULTURE RECEIVED NO GROWTH TO DATE CULTURE WILL BE HELD FOR 5 DAYS BEFORE ISSUING A FINAL NEGATIVE REPORT   Report Status PENDING   Incomplete      Studies: Dg Chest 2 View  01/01/2013  *RADIOLOGY REPORT*  Clinical Data: COPD  CHEST - 2 VIEW  Comparison: Chest radiograph 12/03/2012  Findings: Normal mediastinum and heart silhouette.  Costophrenic angles are clear.  No effusion, infiltrate, or pneumothorax. Degenerative changes at the right shoulder are stable. Degenerative osteophytosis of the thoracic spine.  IMPRESSION: No acute cardiopulmonary process.   Original Report Authenticated By: Genevive Bi, M.D.    Dg Foot Complete Left  01/01/2013  *RADIOLOGY REPORT*   Clinical Data: Pressure ulcers left foot  LEFT FOOT - COMPLETE 3+ VIEW  Comparison: None.  Findings: There is  skin irregularity adjacent to the first metatarsal.  There is no subcutaneous gas.  There is no evidence of cortical destruction to suggest active osteomyelitis.  Mild osteopenia.  IMPRESSION: 1.  No radiographic evidence of osteomyelitis.  2.  Soft tissue deformity  adjacent to  the first metatarsal.   Original Report Authenticated By: Genevive Bi, M.D.     Scheduled Meds:    . aspirin EC  81 mg Oral Daily  . clopidogrel  75 mg Oral Q breakfast  . docusate sodium  100 mg Oral BID  . enoxaparin (LOVENOX) injection  40 mg Subcutaneous Q24H  . ferrous sulfate  325 mg Oral BID AC  . fluocinonide-emollient   Topical BID  . irbesartan  300 mg Oral Daily   And  . hydrochlorothiazide  12.5 mg Oral Daily  . insulin aspart  0-15 Units Subcutaneous TID WC  . insulin glargine  25 Units Subcutaneous QHS  . mometasone-formoterol  2 puff Inhalation BID  . pantoprazole  80 mg Oral Q1200  . predniSONE  40 mg Oral Q2000  . sodium chloride  3 mL Intravenous Q12H  . theophylline  200 mg Oral BID  . Travoprost (BAK Free)  1 drop Both Eyes QHS  . vancomycin  1,500 mg Intravenous Q24H   Continuous Infusions:   Active Problems:  DIABETES-TYPE 2  HYPERTENSION  COPD  Hypothyroidism (acquired)  Critical lower limb ischemia, with ulcer Lt foot- S/P atherectomy, PTA/stenting 12/10/12  PVD, Lt SFA stenting 12/10/12  Leukocytosis  Weakness  Skin rash  Gout attack    Time spent: 60 min   [name], PA-C [facility] Pager [phone]. If 8PM-8AM, contact night-coverage at [email or web address], password [facility] 01/03/2013, 10:50 AM  LOS: 2 days

## 2013-01-04 DIAGNOSIS — I70269 Atherosclerosis of native arteries of extremities with gangrene, unspecified extremity: Secondary | ICD-10-CM

## 2013-01-04 LAB — GLUCOSE, CAPILLARY
Glucose-Capillary: 111 mg/dL — ABNORMAL HIGH (ref 70–99)
Glucose-Capillary: 74 mg/dL (ref 70–99)
Glucose-Capillary: 233 mg/dL — ABNORMAL HIGH (ref 70–99)
Glucose-Capillary: 328 mg/dL — ABNORMAL HIGH (ref 70–99)

## 2013-01-04 MED ORDER — HYDROXYZINE HCL 50 MG PO TABS
50.0000 mg | ORAL_TABLET | Freq: Three times a day (TID) | ORAL | Status: DC | PRN
  Administered 2013-01-04: 50 mg via ORAL
  Filled 2013-01-04 (×2): qty 1

## 2013-01-04 NOTE — Progress Notes (Signed)
[facility] PROGRESS NOTE  [name] FAO:[id number] DOB: Oct 23, 1941 DOA: 01/01/2013 PCP: [name], MD  Assessment/Plan:  Left leg cellulitis: - Leg swollen with chronic wounds. - On Vanc  - Blood Cultures pending (ngtd) - afebrile WBC increasing due to prednisone.  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 Leukocytosis 2/2 to steroids. - Recent SFA stent 12/10/12.   - On Plavix and 81 mg ASA, appreciate cardiology assistance.  - ? How to proceed, any further procedure needed ?  Swollen left knee - No previous diagnosis of gout. received previous corticosteroid injections and several series of viscosupplementation to this left knee Which typically gives her symptommatic relief, orthopedic was consult and - In the absence of an effusion it is unlikely a tap would yield anything nor change her treatment options given her previous history and her current clinical exam. However she would likely benefit from a steroid injection  - Elevated uric acid level - Started on Prednisone 40 mg at admission. Now on prednisone taper.  Skin Rash in the setting of psoriasis - Possibly psoriasis flare vs drug reaction. - Significant rash found on her back, legs, arms, abdomen, chest and parts of her face, improving - treated supportively with topical Lidex and atarax. - Sees Dr. [name] at [facility]  Anemia - Hbg droping slowly. Type and screen. - On plavix and 81 mg asa - On Iron supplementation (with stool softeners) - Will monitor in house.  Transfuse if she drops to 7.0 or if we believe she is symptomatic from the anemia.  COPD - stable - On home medication regimen (albuterol inh, fluticasone, Advair, theophylline)  DM 2 - Will change regimen to lantus 40 unit, SS-Moderate and meal coverage - Normally on glyburide at home.  HTN - Avapro - HCTZ - Moderate control.  Possibly elevated  due to anxiety and pain.  Hx of Barretts Esophagus - PPI.  Anxiety - Low dose ativan PRN  Code Status: Full Family Communication: Disposition Plan: to be determined (home vs SNF) when appropriate.   Consultants:  Cardiology [facility], Dr. Allyson Sabal  Orthopedic Surgery, Dr. Rennis Chris  Vascular Surgery, Dr. Edilia Bo  HPI/Subjective: Patient feeling better.  Left knee much improved.  Still with significant pain in her left foot.  Objective: Filed Vitals:   01/03/13 1946 01/03/13 2217 01/04/13 0545 01/04/13 0805  BP:  140/63 140/55   Pulse:  86 81   Temp:  98.1 F (36.7 C) 98.8 F (37.1 C)   TempSrc:  Oral Oral   Resp:  20 19   Height:      Weight:   75.615 kg (166 lb 11.2 oz)   SpO2: 97% 95% 97% 95%    Intake/Output Summary (Last 24 hours) at 01/04/13 1454 Last data filed at 01/04/13 1006  Gross per 24 hour  Intake   1210 ml  Output   1600 ml  Net   -390 ml   Filed Weights   01/02/13 0513 01/03/13 0436 01/04/13 0545  Weight: 80.74 kg (178 lb) 81.3 kg (179 lb 3.7 oz) 75.615 kg (166 lb 11.2 oz)    Exam:   General:  WD AA female, sitting in chair, tremor decreased compared to Wed. She appears calm  Cardiovascular: S1, S2, no m/r/g  Respiratory: No accessory muscle use, no wheeze  Abdomen: soft, nt, nd, +BS, no masses  Skin:  Pervasive body rash red macules, confluent over her lower back,  also sporadically located on her face, arms and abdomen.  Legs are covered with scale,  Skin shedding on bed sheet and floor  Extremitites:  Extensive scaling of the skin.  Left knee edematous improved.  Not nearly as tender. Foot wrapped in gauze, first two digits are blackened, but not cold.   Data Reviewed: Basic Metabolic Panel:  Lab 01/01/13 1610 01/01/13 1229  NA -- 135  K -- 4.1  CL -- 101  CO2 -- 22  GLUCOSE -- 212*  BUN -- 15  CREATININE 0.90 0.78  CALCIUM -- 9.3  MG -- --  PHOS -- --   Liver Function Tests:  Lab 01/01/13 1229  AST 25  ALT 35  ALKPHOS 112   BILITOT 0.4  PROT 6.7  ALBUMIN 2.6*   CBC:  Lab 01/03/13 0610 01/01/13 2113 01/01/13 1229  WBC 15.9* 12.4* 13.1*  NEUTROABS -- -- 10.4*  HGB 7.7* 8.0* 7.6*  HCT 24.7* 26.2* 25.5*  MCV 84.6 87.0 87.3  PLT 309 323 314   Cardiac Enzymes:  Lab 01/02/13 0622 01/01/13 2326 01/01/13 1743  CKTOTAL -- -- --  CKMB -- -- --  CKMBINDEX -- -- --  TROPONINI <0.30 <0.30 <0.30   CBG:  Lab 01/04/13 1159 01/04/13 0801 01/03/13 2205 01/03/13 1645 01/03/13 1123  GLUCAP 233* 74 111* 182* 205*    Recent Results (from the past 240 hour(s))  CULTURE, BLOOD (ROUTINE X 2)     Status: Normal (Preliminary result)   Collection Time   01/01/13  5:45 PM      Component Value Range Status Comment   Specimen Description BLOOD ARM LEFT   Final    Special Requests BOTTLES DRAWN AEROBIC AND ANAEROBIC 10CC   Final    Culture  Setup Time 01/01/2013 23:47   Final    Culture     Final    Value:        BLOOD CULTURE RECEIVED NO GROWTH TO DATE CULTURE WILL BE HELD FOR 5 DAYS BEFORE ISSUING A FINAL NEGATIVE REPORT   Report Status PENDING   Incomplete   CULTURE, BLOOD (ROUTINE X 2)     Status: Normal (Preliminary result)   Collection Time   01/01/13  6:00 PM      Component Value Range Status Comment   Specimen Description BLOOD HAND LEFT   Final    Special Requests BOTTLES DRAWN AEROBIC ONLY 10CC   Final    Culture  Setup Time 01/01/2013 23:47   Final    Culture     Final    Value:        BLOOD CULTURE RECEIVED NO GROWTH TO DATE CULTURE WILL BE HELD FOR 5 DAYS BEFORE ISSUING A FINAL NEGATIVE REPORT   Report Status PENDING   Incomplete      Studies: No results found.  Scheduled Meds:    . aspirin EC  81 mg Oral Daily  . clopidogrel  75 mg Oral Q breakfast  . docusate sodium  100 mg Oral BID  . enoxaparin (LOVENOX) injection  40 mg Subcutaneous Q24H  . ferrous sulfate  325 mg Oral BID AC  . fluocinonide-emollient   Topical BID  . irbesartan  300 mg Oral Daily   And  . hydrochlorothiazide  12.5 mg  Oral Daily  . insulin aspart  0-20 Units Subcutaneous TID WC  . insulin aspart  0-5 Units Subcutaneous QHS  . insulin aspart  4 Units Subcutaneous TID WC  . insulin glargine  40 Units Subcutaneous  QHS  . mometasone-formoterol  2 puff Inhalation BID  . pantoprazole  80 mg Oral Q1200  . predniSONE  30 mg Oral Q breakfast  . sodium chloride  3 mL Intravenous Q12H  . theophylline  200 mg Oral BID  . Travoprost (BAK Free)  1 drop Both Eyes QHS  . vancomycin  1,500 mg Intravenous Q24H   Continuous Infusions:   Active Problems:  DIABETES-TYPE 2  HYPERTENSION  COPD  Hypothyroidism (acquired)  Critical lower limb ischemia, with ulcer Lt foot- S/P atherectomy, PTA/stenting 12/10/12  PVD, Lt SFA stenting 12/10/12  Leukocytosis  Weakness  Skin rash  Gout attack    Time spent: 30 min   Conley Canal [facility] Pager [phone]. If 8PM-8AM, contact night-coverage at [email or web address], password [facility] 01/04/2013, 2:54 PM  LOS: 3 days

## 2013-01-04 NOTE — Consult Note (Signed)
[facility] [place] CONSULT NOTE 01/04/2013 DOB: [id number] MRN : [id number]  BJ:YNWG first and second digit gangren Referring Physician:Marianne L York, PA   History of Present Illness: [name] is a 72 y.o. female with PMH significant for Diabetes, psoriasis, PVD sp stent to left SFA December 09 2012 by Dr. Allyson Sabal who presents complaining of generalized weakness. She also relates chills, worsening left foot pain since 2 to 3 days prior to admission. She also has had a rash legs, arms and chest since 2 to 3 weeks prior to admission.   We are consulted for gangrene of the digits on the left foot.     Past Medical History  Diagnosis Date  . COPD (chronic obstructive pulmonary disease)   . Hypertension   . Asthma   . Psoriatic arthritis   . Psoriasis   . GERD (gastroesophageal reflux disease)   . Hiatal hernia   . Chronic sinus infection   . Heart murmur   . Blood transfusion ~ 1962    "11 units of blood"  . Ankylosing spondylitis   . Chronic back pain   . Dysrhythmia     "palpitations"  . Esophageal dilatation   . Candida esophagitis   . Cirrhosis of liver without mention of alcohol   . Hypercholesteremia   . Peripheral vascular disease   . Exertional dyspnea   . Pneumonia 10/07/11; 2006; 1980's  . Chronic bronchitis     "since childhood" (12/06/2012)  . Type II diabetes mellitus   . Iron deficiency anemia   . External bleeding hemorrhoids   . Sinus headache   . Arthritis     "psorasic"  . OA (osteoarthritis)   . DJD (degenerative joint disease) of knee     "both" (12/06/2012)  . Carpal tunnel syndrome on right   . Anxiety   . Barrett esophagus   . Non-healing ulcer of foot, secondary to diabetes and PVD 12/07/2012  . PVD (peripheral vascular disease) 12/07/2012  . Tobacco abuse 12/07/2012  . Critical lower limb ischemia, with ulcer Lt foot 12/07/2012  . Gout   . Psoriasis (a type of skin inflammation)   . COPD (chronic obstructive pulmonary  disease)     Past Surgical History  Procedure Date  . Nasal sinus surgery 09/29/2009  . Carpal tunnel release 2011    left hand  . Lipoma excision 2010    "left occipital area"  . Cataract extraction w/ intraocular lens implant 2009/2010    right/left  . Eye surgery   . Oophorectomy     'not sure which one"  . Appendectomy   . Angioplasty 12/06/2012    "LLE"  . Abdominal hysterectomy 1976  . Breast biopsy 1960's    "think he did both" (12/06/2012)     ROS: [x]  Positive  [ ]  Denies    General: [ ]  Weight loss, [ ]  Fever, [ ]  chills Neurologic: [ ]  Dizziness, [ ]  Blackouts, [ ]  Seizure [ ]  Stroke, [ ]  "Mini stroke", [ ]  Slurred speech, [ ]  Temporary blindness; [ ]  weakness in arms or legs, [ ]  Hoarseness Cardiac: [ ]  Chest pain/pressure, [ ]  Shortness of breath at rest [ ]  Shortness of breath with exertion, [ ]  Atrial fibrillation or irregular heartbeat Vascular: [ ]  Pain in legs with walking, [ ]  Pain in legs at rest, [ ]  Pain in legs at night,  [ ]  Non-healing ulcer, [ ]  Blood clot in vein/DVT,   Pulmonary: [ ]  Home  oxygen, [ ]  Productive cough, [ ]  Coughing up blood, [ ]  Asthma,  [ ]  Wheezing Musculoskeletal:  [x ] Arthritis, [ ]  Low back pain, [ ]  Joint pain Hematologic: [ ]  Easy Bruising, [ ]  Anemia; [ ]  Hepatitis Gastrointestinal: [ ]  Blood in stool, [ ]  Gastroesophageal Reflux/heartburn, [ ]  Trouble swallowing Urinary: [ ]  chronic Kidney disease, [ ]  on HD - [ ]  MWF or [ ]  TTHS, [ ]  Burning with urination, [ ]  Difficulty urinating Skin: [ ]  Rashes, [x ] Wounds Psychological: [ ]  Anxiety, [ ]  Depression  Social History History  Substance Use Topics  . Smoking status: Current Some Day Smoker -- 1.0 packs/day for 40 years    Types: Cigarettes  . Smokeless tobacco: Never Used     Comment: 12/06/2012 "trying e-cigarettes now"  . Alcohol Use: No     Comment: "quit social drinking years ago"    Family History Family History  Problem Relation Age of Onset  . Lupus Sister    . Lung cancer Sister   . Thyroid disease Sister   . Colon cancer Neg Hx     Allergies  Allergen Reactions  . Amlodipine Besy-Benazepril Hcl Other (See Comments)    Nervous/shakiness  . Statins Other (See Comments)    "can't take any of them; cramp me up" (12/06/2012)  . Raptiva (Efalizumab) Rash    Current Facility-Administered Medications  Medication Dose Route Frequency Provider Last Rate Last Dose  . 0.9 %  sodium chloride infusion  250 mL Intravenous PRN Belkys A Regalado, MD      . acetaminophen (TYLENOL) tablet 650 mg  650 mg Oral Q4H PRN Belkys A Regalado, MD      . albuterol (PROVENTIL HFA;VENTOLIN HFA) 108 (90 BASE) MCG/ACT inhaler 1 puff  1 puff Inhalation Q6H PRN Belkys A Regalado, MD      . albuterol (PROVENTIL) (5 MG/ML) 0.5% nebulizer solution 2.5 mg  2.5 mg Nebulization Q2H PRN Belkys A Regalado, MD      . aspirin EC tablet 81 mg  81 mg Oral Daily Belkys A Regalado, MD   81 mg at 01/04/13 0957  . clopidogrel (PLAVIX) tablet 75 mg  75 mg Oral Q breakfast Belkys A Regalado, MD   75 mg at 01/04/13 0842  . docusate sodium (COLACE) capsule 100 mg  100 mg Oral BID Belkys A Regalado, MD   100 mg at 01/04/13 0957  . enoxaparin (LOVENOX) injection 40 mg  40 mg Subcutaneous Q24H Lauren Bajbus, PHARMD   40 mg at 01/03/13 2034  . ferrous sulfate tablet 325 mg  325 mg Oral BID AC Marinda Elk, MD   325 mg at 01/04/13 1610  . fluocinonide-emollient (LIDEX-E) 0.05 % cream   Topical BID Stephani Police, PA      . fluticasone (FLONASE) 50 MCG/ACT nasal spray 2 spray  2 spray Each Nare Daily PRN Belkys A Regalado, MD      . irbesartan (AVAPRO) tablet 300 mg  300 mg Oral Daily Estela Isaiah Blakes, MD   300 mg at 01/04/13 0957   And  . hydrochlorothiazide (MICROZIDE) capsule 12.5 mg  12.5 mg Oral Daily Henderson Cloud, MD   12.5 mg at 01/04/13 0957  . HYDROcodone-acetaminophen (NORCO/VICODIN) 5-325 MG per tablet 1 tablet  1 tablet Oral Q4H PRN Belkys A Regalado, MD   1  tablet at 01/04/13 1224  . hydrOXYzine (ATARAX/VISTARIL) tablet 50 mg  50 mg Oral TID PRN Marinda Elk,  MD      . insulin aspart (novoLOG) injection 0-20 Units  0-20 Units Subcutaneous TID WC Marinda Elk, MD   7 Units at 01/04/13 1301  . insulin aspart (novoLOG) injection 0-5 Units  0-5 Units Subcutaneous QHS Marinda Elk, MD      . insulin aspart (novoLOG) injection 4 Units  4 Units Subcutaneous TID WC Marinda Elk, MD   4 Units at 01/04/13 1302  . insulin glargine (LANTUS) injection 40 Units  40 Units Subcutaneous QHS Marinda Elk, MD   40 Units at 01/03/13 2210  . LORazepam (ATIVAN) tablet 0.5 mg  0.5 mg Oral BID PRN Stephani Police, PA   0.5 mg at 01/03/13 1649  . mometasone-formoterol (DULERA) 100-5 MCG/ACT inhaler 2 puff  2 puff Inhalation BID Alba Cory, MD   2 puff at 01/04/13 0804  . morphine 2 MG/ML injection 1 mg  1 mg Intravenous Q4H PRN Belkys A Regalado, MD   1 mg at 01/02/13 0841  . pantoprazole (PROTONIX) EC tablet 80 mg  80 mg Oral Q1200 Belkys A Regalado, MD   80 mg at 01/04/13 1224  . predniSONE (DELTASONE) tablet 30 mg  30 mg Oral Q breakfast Marinda Elk, MD   30 mg at 01/04/13 0819  . sodium chloride 0.9 % injection 3 mL  3 mL Intravenous Q12H Belkys A Regalado, MD   3 mL at 01/03/13 2210  . sodium chloride 0.9 % injection 3 mL  3 mL Intravenous PRN Belkys A Regalado, MD      . theophylline (THEODUR) 12 hr tablet 200 mg  200 mg Oral BID Belkys A Regalado, MD   200 mg at 01/04/13 0957  . Travoprost (BAK Free) (TRAVATAN) 0.004 % ophthalmic solution SOLN 1 drop  1 drop Both Eyes QHS Belkys A Regalado, MD   1 drop at 01/03/13 2213  . vancomycin (VANCOCIN) 1,500 mg in sodium chloride 0.9 % 500 mL IVPB  1,500 mg Intravenous Q24H Ann Held, PHARMD   1,500 mg at 01/03/13 2035     Imaging: No results found.  Significant Diagnostic Studies: CBC Lab Results  Component Value Date   WBC 15.9* 01/03/2013   HGB 7.7* 01/03/2013    HCT 24.7* 01/03/2013   MCV 84.6 01/03/2013   PLT 309 01/03/2013    BMET    Component Value Date/Time   NA 135 01/01/2013 1229   K 4.1 01/01/2013 1229   CL 101 01/01/2013 1229   CO2 22 01/01/2013 1229   GLUCOSE 212* 01/01/2013 1229   BUN 15 01/01/2013 1229   CREATININE 0.90 01/01/2013 2113   CALCIUM 9.3 01/01/2013 1229   GFRNONAA 62* 01/01/2013 2113   GFRAA 72* 01/01/2013 2113    COAG Lab Results  Component Value Date   INR 1.19 12/10/2012   INR 1.0 10/27/2011   No results found for this basename: PTT     Physical Examination BP Readings from Last 3 Encounters:  01/04/13 140/55  12/12/12 115/75  12/12/12 115/75   Temp Readings from Last 3 Encounters:  01/04/13 98.8 F (37.1 C) Oral  12/12/12 98 F (36.7 C) Oral  12/12/12 98 F (36.7 C) Oral   SpO2 Readings from Last 3 Encounters:  01/04/13 95%  12/12/12 94%  12/12/12 94%   Pulse Readings from Last 3 Encounters:  01/04/13 81  12/12/12 111  12/12/12 111    General:  WDWN in NAD Gait: Normal HENT: WNL Eyes: Pupils equal Pulmonary: normal non-labored  breathing Cardiac: RRRAbdomen: soft, NT, no masses Skin: warm to touch bilateral LE.  DP doppler bilateral.  First and second left digit gangrene.  Left heel ulcer yellow eschar.  Right foot has dusky discoloration of digits without open wounds. Vascular Exam/Pulses:Doppler monophasic DP bilateral.   Extremities with ischemic changes and  Gangrene left first and second digits , no cellulitis; left heel open wounds;  Musculoskeletal: no muscle wasting or atrophy  Neurologic: A&O X 3; Appropriate Affect ;  SENSATION: decreased from the knees down bilateral LE MOTOR FUNCTION: Pt has fair  and equal strength in all extremities - 4/5 Speech is fluent/normal  Non-Invasive Vascular Imaging: Recent LEA dopplers, completed on 1/30: right 0.59 left 0.75.   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 I will discuss a care plan with Dr.  Edilia Bo.   Harding, Shelby MAUREEN PA-C  Agree with above. This patient has extensive gangrene involving the great toe and second toe of the left foot. In addition there is a full thickness wound over the fifth metatarsal which extends down to the tendon. There is also a small wound on the lateral aspect of the left heel but no significant drainage currently. Of note, x-ray of the left foot did not show any evidence of osteomyelitis of the calcaneus. This patient has undergone endovascular revascularization by Dr. Allyson Sabal with an excellent result. She had an occlusion of the superficial femoral artery which was recanalized. Patient had a patent peroneal artery below that and a diseased anterior tibial artery. On my exam she has brisk Doppler flow in the posterior tibial artery on the left and peroneal artery. There is a monophasic dorsalis pedis signal. Given the extent of the wounds on the left foot with dry gangrene of the first and second toes and in full thickness wound down to the tendon over the fifth metatarsal I think that she would require a transmetatarsal amputation. I've explained that certainly despite successful revascularization by Dr. Allyson Sabal there is some chance of this not healing and that she could ultimately require a more proximal amputation. However I think this does have a reasonable chance of healing and I could potentially schedule this Tuesday of next week.  Cari Caraway Beeper [phone] 01/04/2013

## 2013-01-04 NOTE — Progress Notes (Signed)
Patient ID: [name], female   DOB: [birth year], 72 y.o.   MRN: [id number] Courtesy Visit:  Patient seen and is still being evaluated for definitive treatment  About poor circulation and cellulitis of her foot and leg.  Will check on her periodically concerning her progress.  Philis Kendall, MD

## 2013-01-04 NOTE — Consult Note (Signed)
Wound care follow-up: Wound consult requested this AM.  Consult performed 2/5.  Please refer to previous progress notes for assessment and plan of care. Will not plan to follow further unless re-consulted.  [address], RN, MSN, [employer]  [phone]

## 2013-01-05 DIAGNOSIS — E119 Type 2 diabetes mellitus without complications: Secondary | ICD-10-CM

## 2013-01-05 DIAGNOSIS — L408 Other psoriasis: Secondary | ICD-10-CM

## 2013-01-05 DIAGNOSIS — R21 Rash and other nonspecific skin eruption: Secondary | ICD-10-CM

## 2013-01-05 LAB — GLUCOSE, CAPILLARY
Glucose-Capillary: 74 mg/dL (ref 70–99)
Glucose-Capillary: 198 mg/dL — ABNORMAL HIGH (ref 70–99)
Glucose-Capillary: 140 mg/dL — ABNORMAL HIGH (ref 70–99)

## 2013-01-05 LAB — CBC
MCH: 25.9 pg — ABNORMAL LOW (ref 26.0–34.0)
MCHC: 29.9 g/dL — ABNORMAL LOW (ref 30.0–36.0)
Platelets: 311 10*3/uL (ref 150–400)

## 2013-01-05 MED ORDER — PANTOPRAZOLE SODIUM 20 MG PO TBEC
20.0000 mg | DELAYED_RELEASE_TABLET | Freq: Every day | ORAL | Status: DC
  Administered 2013-01-05 – 2013-01-11 (×6): 20 mg via ORAL
  Filled 2013-01-05 (×7): qty 1

## 2013-01-05 MED ORDER — HYDROXYZINE HCL 50 MG PO TABS
50.0000 mg | ORAL_TABLET | Freq: Three times a day (TID) | ORAL | Status: DC
  Administered 2013-01-05 – 2013-01-10 (×14): 50 mg via ORAL
  Filled 2013-01-05 (×18): qty 1

## 2013-01-05 MED ORDER — DIPHENHYDRAMINE HCL 25 MG PO CAPS
25.0000 mg | ORAL_CAPSULE | Freq: Four times a day (QID) | ORAL | Status: DC | PRN
  Administered 2013-01-06 – 2013-01-08 (×2): 25 mg via ORAL
  Filled 2013-01-05 (×2): qty 1

## 2013-01-05 NOTE — Progress Notes (Signed)
-   I have review the date and agree with above. - awaiting vascular recommendations.

## 2013-01-05 NOTE — Progress Notes (Addendum)
[facility] PROGRESS NOTE  [name] ZOX:[id number] DOB: Oct 06, 1941 DOA: 01/01/2013 PCP: [name], MD  Assessment/Plan:  Left leg cellulitis/Necrosis of the 1st and 2nd digit and LLE with non-healing wounds: - Leg swollen with chronic wounds. - Recent SFA stent 12/10/12.   - consulted vascular related require a transmetatarsal amputation, Given the extent of the wounds on the left foot,  first and second toes and in full thickness wound down to the tendon over the fifth metatarsal. Surgery on 2.11.2014. - Blood Cultures negative till date.. - On Plavix and 81 mg ASA, appreciate cardiology assistance.  - d/ vancomycin as blood cultures continue to negative, has remained afebrile. - patient refuses to take her plavix as she this is causing this rash. - d/c avapro.  Swollen left knee - improved swelling with steroids injection. - Elevated uric acid level - Now on prednisone taper.  Skin Rash in the setting of psoriasis - ? Drug reaction vs psoriasis, d/c vancomycin BC negative no fevers, d/c avapro and HCTZ. Patient refuses to take plavix as she think is causing her rash. - Significant rash found on her back, legs, arms, abdomen, chest and parts of her face, improving - Sees [facility] [name] at [facility]  Anemia - Hbg droping slowly. Type and screen. - On plavix and 81 mg asa - On Iron supplementation (with stool softeners) - Will monitor in house.  Transfuse if she drops to 7.0 or if we believe she is symptomatic from the anemia.  COPD - stable - On home medication regimen (albuterol inh, fluticasone, Advair, theophylline)  DM 2 - Will change regimen to lantus 40 unit, SS-Moderate and meal coverage - Normally on glyburide at home.  HTN - d/c  Avapro - HCTZ - Moderate control.  Possibly elevated due to anxiety and pain.  Hx of Barretts Esophagus - PPI.  Anxiety - Low dose ativan PRN  Code Status: Full Family  Communication: Disposition Plan: to be determined (home vs SNF) when appropriate.   Consultants:  Cardiology [facility], Dr. Allyson Sabal  Orthopedic Surgery, Dr. Rennis Chris  Vascular Surgery, Dr. Edilia Bo  HPI/Subjective: - Patient feeling better.   - itching a lot, she does not want to take her Plavix.  Objective: Filed Vitals:   01/04/13 1909 01/04/13 2123 01/05/13 0600 01/05/13 0800  BP:  171/69 142/66   Pulse:  91 81   Temp:  98.9 F (37.2 C) 98.6 F (37 C)   TempSrc:  Oral Oral   Resp:  20 18   Height:      Weight:      SpO2: 98% 100% 96% 99%    Intake/Output Summary (Last 24 hours) at 01/05/13 1025 Last data filed at 01/04/13 1300  Gross per 24 hour  Intake    240 ml  Output      0 ml  Net    240 ml   Filed Weights   01/02/13 0513 01/03/13 0436 01/04/13 0545  Weight: 80.74 kg (178 lb) 81.3 kg (179 lb 3.7 oz) 75.615 kg (166 lb 11.2 oz)    Exam:   General:  WD AA female, sitting in chair, tremor decreased compared to Wed. She appears calm  Cardiovascular: S1, S2, no m/r/g  Respiratory: No accessory muscle use, no wheeze  Abdomen: soft, nt, nd, +BS, no masses  Skin:  Pervasive body rash red macules, confluent over her lower back, also sporadically located on her face, arms and abdomen.  Legs are covered with scale,  Skin shedding  on bed sheet and floor  Extremitites:  Extensive scaling of the skin.  Left knee edematous improved.  Not nearly as tender. Foot wrapped in gauze, first two digits are blackened, but not cold.   Data Reviewed: Basic Metabolic Panel:  Recent Labs Lab 01/01/13 1229 01/01/13 2113  NA 135  --   K 4.1  --   CL 101  --   CO2 22  --   GLUCOSE 212*  --   BUN 15  --   CREATININE 0.78 0.90  CALCIUM 9.3  --    Liver Function Tests:  Recent Labs Lab 01/01/13 1229  AST 25  ALT 35  ALKPHOS 112  BILITOT 0.4  PROT 6.7  ALBUMIN 2.6*   CBC:  Recent Labs Lab 01/01/13 1229 01/01/13 2113 01/03/13 0610 01/05/13 0620  WBC 13.1* 12.4*  15.9* 11.5*  NEUTROABS 10.4*  --   --   --   HGB 7.6* 8.0* 7.7* 7.8*  HCT 25.5* 26.2* 24.7* 26.1*  MCV 87.3 87.0 84.6 86.7  PLT 314 323 309 311   Cardiac Enzymes:  Recent Labs Lab 01/01/13 1743 01/01/13 2326 01/02/13 0622  TROPONINI <0.30 <0.30 <0.30   CBG:  Recent Labs Lab 01/04/13 0801 01/04/13 1159 01/04/13 1725 01/04/13 2119 01/05/13 0747  GLUCAP 74 233* 328* 161* 74    Recent Results (from the past 240 hour(s))  CULTURE, BLOOD (ROUTINE X 2)     Status: None   Collection Time    01/01/13  5:45 PM      Result Value Range Status   Specimen Description BLOOD ARM LEFT   Final   Special Requests BOTTLES DRAWN AEROBIC AND ANAEROBIC 10CC   Final   Culture  Setup Time 01/01/2013 23:47   Final   Culture     Final   Value:        BLOOD CULTURE RECEIVED NO GROWTH TO DATE CULTURE WILL BE HELD FOR 5 DAYS BEFORE ISSUING A FINAL NEGATIVE REPORT   Report Status PENDING   Incomplete  CULTURE, BLOOD (ROUTINE X 2)     Status: None   Collection Time    01/01/13  6:00 PM      Result Value Range Status   Specimen Description BLOOD HAND LEFT   Final   Special Requests BOTTLES DRAWN AEROBIC ONLY 10CC   Final   Culture  Setup Time 01/01/2013 23:47   Final   Culture     Final   Value:        BLOOD CULTURE RECEIVED NO GROWTH TO DATE CULTURE WILL BE HELD FOR 5 DAYS BEFORE ISSUING A FINAL NEGATIVE REPORT   Report Status PENDING   Incomplete     Studies: No results found.  Scheduled Meds: . aspirin EC  81 mg Oral Daily  . clopidogrel  75 mg Oral Q breakfast  . docusate sodium  100 mg Oral BID  . enoxaparin (LOVENOX) injection  40 mg Subcutaneous Q24H  . ferrous sulfate  325 mg Oral BID AC  . fluocinonide-emollient   Topical BID  . irbesartan  300 mg Oral Daily   And  . hydrochlorothiazide  12.5 mg Oral Daily  . hydrOXYzine  50 mg Oral TID  . insulin aspart  0-20 Units Subcutaneous TID WC  . insulin aspart  0-5 Units Subcutaneous QHS  . insulin aspart  4 Units Subcutaneous TID  WC  . insulin glargine  40 Units Subcutaneous QHS  . mometasone-formoterol  2 puff Inhalation BID  . pantoprazole  80 mg Oral Q1200  . sodium chloride  3 mL Intravenous Q12H  . theophylline  200 mg Oral BID  . Travoprost (BAK Free)  1 drop Both Eyes QHS  . vancomycin  1,500 mg Intravenous Q24H   Continuous Infusions:   Active Problems:   DIABETES-TYPE 2   HYPERTENSION   COPD   Hypothyroidism (acquired)   Critical lower limb ischemia, with ulcer Lt foot- S/P atherectomy, PTA/stenting 12/10/12   PVD, Lt SFA stenting 12/10/12   Leukocytosis   Weakness   Skin rash   Gout attack    Time spent: 30 min   [name], PA-C [facility] Pager [phone]. If 8PM-8AM, contact night-coverage at [email or web address], password [facility] 01/05/2013, 10:25 AM  LOS: 4 days

## 2013-01-05 NOTE — Progress Notes (Signed)
VASCULAR PROGRESS NOTE  SUBJECTIVE: moderate discomfort left foot.  PHYSICAL EXAM: Filed Vitals:   01/04/13 2123 01/05/13 0600 01/05/13 0800 01/05/13 1044  BP: 171/69 142/66  132/75  Pulse: 91 81  85  Temp: 98.9 F (37.2 C) 98.6 F (37 C)  100.2 F (37.9 C)  TempSrc: Oral Oral  Oral  Resp: 20 18    Height:      Weight:      SpO2: 100% 96% 99% 92%   No change in dry gangrene of left first second toes with a full thickness wound over the fifth metatarsal.  LABS: Lab Results  Component Value Date   WBC 11.5* 01/05/2013   HGB 7.8* 01/05/2013   HCT 26.1* 01/05/2013   MCV 86.7 01/05/2013   PLT 311 01/05/2013   Lab Results  Component Value Date   CREATININE 0.90 01/01/2013   Lab Results  Component Value Date   INR 1.19 12/10/2012   CBG (last 3)   Recent Labs  01/04/13 1725 01/04/13 2119 01/05/13 0747  GLUCAP 328* 161* 74    Active Problems:   DIABETES-TYPE 2   HYPERTENSION   COPD   Hypothyroidism (acquired)   Critical lower limb ischemia, with ulcer Lt foot- S/P atherectomy, PTA/stenting 12/10/12   PVD, Lt SFA stenting 12/10/12   Leukocytosis   Weakness   Skin rash   Gout attack   ASSESSMENT AND PLAN: 1. I will schedule her for left transmetatarsal amputation on Tuesday area I've explained that there is a 15% chance of nonhealing in which case she would require a more proximal amputation (BKA).  Cari Caraway Beeper: [phone] 01/05/2013

## 2013-01-06 DIAGNOSIS — L97529 Non-pressure chronic ulcer of other part of left foot with unspecified severity: Secondary | ICD-10-CM | POA: Diagnosis present

## 2013-01-06 LAB — GLUCOSE, CAPILLARY
Glucose-Capillary: 79 mg/dL (ref 70–99)
Glucose-Capillary: 172 mg/dL — ABNORMAL HIGH (ref 70–99)
Glucose-Capillary: 213 mg/dL — ABNORMAL HIGH (ref 70–99)
Glucose-Capillary: 215 mg/dL — ABNORMAL HIGH (ref 70–99)

## 2013-01-06 MED ORDER — MORPHINE SULFATE 2 MG/ML IJ SOLN
2.0000 mg | INTRAMUSCULAR | Status: DC | PRN
  Administered 2013-01-06 – 2013-01-09 (×9): 2 mg via INTRAVENOUS
  Filled 2013-01-06 (×9): qty 1

## 2013-01-06 MED ORDER — PREDNISONE 20 MG PO TABS
30.0000 mg | ORAL_TABLET | Freq: Every day | ORAL | Status: DC
  Administered 2013-01-06: 30 mg via ORAL
  Filled 2013-01-06 (×3): qty 1

## 2013-01-06 NOTE — Progress Notes (Signed)
Orthopedic Tech Progress Note Patient Details:  [name] 10-Nov-1941 [id number]  Ortho Devices Type of Ortho Device: Wrist splint Ortho Device/Splint Location: RIGHT WIST SPLINT Ortho Device/Splint Interventions: Application   Cammer, Mickie Bail 01/06/2013, 3:45 PM

## 2013-01-06 NOTE — Progress Notes (Signed)
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 - Recent SFA stent 12/10/12.   - consulted vascular related require a transmetatarsal amputation on 2.11.2014. - Blood Cultures negative till date.. - d/c vancomycin as blood cultures continue to negative, has remained afebrile. - patient refuses to take her plavix as she this is causing this rash.  Swollen painfull left knee - resolved - Now on prednisone taper.  Skin Rash in the setting of psoriasis - ? Drug reaction vs psoriasis, d/c vancomycin BC negative no fevers, d/c avapro and HCTZ. Patient refuses to take plavix as she think is causing her rash.  Anemia - Hbg droping slowly. Type and screen. - 81 mg asa - On Iron supplementation (with stool softeners) - Will monitor in house.  Transfuse if she drops to 7.0 or if we believe she is symptomatic from the anemia.  COPD - stable - On home medication regimen (albuterol inh, fluticasone, Advair, theophylline)  DM 2 - Will change regimen to lantus 40 unit, SS-Moderate and meal coverage - Normally on glyburide at home.  HTN - d/c  Avapro - HCTZ - Moderate control.  Possibly elevated due to anxiety and pain.  Hx of Barretts Esophagus - PPI.  Anxiety - Low dose ativan PRN  Code Status: Full Family Communication: Disposition Plan: to be determined (home vs SNF) when appropriate.   Consultants:  Cardiology [facility], Dr. Allyson Sabal  Orthopedic Surgery, Dr. Rennis Chris  Vascular Surgery, Dr. Edilia Bo  HPI/Subjective: - Patient feeling better.   -  she does not want to take her Plavix. - wrist pain Objective: Filed Vitals:   01/05/13 2116 01/05/13 2131 01/06/13 0514 01/06/13 0805  BP: 136/64  159/71   Pulse: 73  81   Temp: 99.1 F (37.3 C)  98.9 F (37.2 C)    TempSrc: Oral  Oral   Resp: 16  16   Height:      Weight:   77.3 kg (170 lb 6.7 oz)   SpO2: 100% 99% 94% 96%    Intake/Output Summary (Last 24 hours) at 01/06/13 1029 Last data filed at 01/06/13 0900  Gross per 24 hour  Intake    743 ml  Output   1700 ml  Net   -957 ml   Filed Weights   01/03/13 0436 01/04/13 0545 01/06/13 0514  Weight: 81.3 kg (179 lb 3.7 oz) 75.615 kg (166 lb 11.2 oz) 77.3 kg (170 lb 6.7 oz)    Exam:   General:  WD AA female, sitting in chair, tremor decreased compared to Wed. She appears calm  Cardiovascular: S1, S2, no m/r/g  Respiratory: No accessory muscle use, no wheeze  Abdomen: soft, nt, nd, +BS, no masses  Skin:  Pervasive body rash red macules, confluent over her lower back, also sporadically located on her face, arms and abdomen.  Legs are covered with scale,  Skin shedding on bed sheet and floor  Extremitites:  Extensive scaling of the skin.  Left knee edematous improved.  Not nearly as tender. Foot wrapped in gauze, first two digits are blackened, but not cold.   Data Reviewed: Basic Metabolic Panel:  Recent Labs Lab 01/01/13 1229 01/01/13 2113  NA 135  --   K 4.1  --   CL 101  --   CO2 22  --   GLUCOSE  212*  --   BUN 15  --   CREATININE 0.78 0.90  CALCIUM 9.3  --    Liver Function Tests:  Recent Labs Lab 01/01/13 1229  AST 25  ALT 35  ALKPHOS 112  BILITOT 0.4  PROT 6.7  ALBUMIN 2.6*   CBC:  Recent Labs Lab 01/01/13 1229 01/01/13 2113 01/03/13 0610 01/05/13 0620  WBC 13.1* 12.4* 15.9* 11.5*  NEUTROABS 10.4*  --   --   --   HGB 7.6* 8.0* 7.7* 7.8*  HCT 25.5* 26.2* 24.7* 26.1*  MCV 87.3 87.0 84.6 86.7  PLT 314 323 309 311   Cardiac Enzymes:  Recent Labs Lab 01/01/13 1743 01/01/13 2326 01/02/13 0622  TROPONINI <0.30 <0.30 <0.30   CBG:  Recent Labs Lab 01/05/13 0747 01/05/13 1211 01/05/13 1738 01/05/13 2122 01/06/13 0723  GLUCAP 74 198* 241* 140* 79    Recent Results (from the past 240  hour(s))  CULTURE, BLOOD (ROUTINE X 2)     Status: None   Collection Time    01/01/13  5:45 PM      Result Value Range Status   Specimen Description BLOOD ARM LEFT   Final   Special Requests BOTTLES DRAWN AEROBIC AND ANAEROBIC 10CC   Final   Culture  Setup Time 01/01/2013 23:47   Final   Culture     Final   Value:        BLOOD CULTURE RECEIVED NO GROWTH TO DATE CULTURE WILL BE HELD FOR 5 DAYS BEFORE ISSUING A FINAL NEGATIVE REPORT   Report Status PENDING   Incomplete  CULTURE, BLOOD (ROUTINE X 2)     Status: None   Collection Time    01/01/13  6:00 PM      Result Value Range Status   Specimen Description BLOOD HAND LEFT   Final   Special Requests BOTTLES DRAWN AEROBIC ONLY 10CC   Final   Culture  Setup Time 01/01/2013 23:47   Final   Culture     Final   Value:        BLOOD CULTURE RECEIVED NO GROWTH TO DATE CULTURE WILL BE HELD FOR 5 DAYS BEFORE ISSUING A FINAL NEGATIVE REPORT   Report Status PENDING   Incomplete     Studies: No results found.  Scheduled Meds: . aspirin EC  81 mg Oral Daily  . docusate sodium  100 mg Oral BID  . enoxaparin (LOVENOX) injection  40 mg Subcutaneous Q24H  . hydrOXYzine  50 mg Oral TID  . insulin aspart  0-20 Units Subcutaneous TID WC  . insulin aspart  0-5 Units Subcutaneous QHS  . insulin aspart  4 Units Subcutaneous TID WC  . insulin glargine  40 Units Subcutaneous QHS  . mometasone-formoterol  2 puff Inhalation BID  . pantoprazole  20 mg Oral Q1200  . sodium chloride  3 mL Intravenous Q12H  . theophylline  200 mg Oral BID  . Travoprost (BAK Free)  1 drop Both Eyes QHS   Continuous Infusions:   Active Problems:   DIABETES-TYPE 2   HYPERTENSION   COPD   Hypothyroidism (acquired)   Critical lower limb ischemia, with ulcer Lt foot- S/P atherectomy, PTA/stenting 12/10/12   PVD, Lt SFA stenting 12/10/12   Leukocytosis   Weakness   Skin rash   Gout attack    Time spent: 30 min   [name], PA-C [facility] Pager  [phone]. If 8PM-8AM, contact night-coverage at [email or web address], password [facility] 01/06/2013, 10:29 AM  LOS: 5  days

## 2013-01-06 NOTE — Progress Notes (Signed)
VASCULAR PROGRESS NOTE  SUBJECTIVE: moderate discomfort left foot  PHYSICAL EXAM: Filed Vitals:   01/05/13 1537 01/05/13 2116 01/05/13 2131 01/06/13 0514  BP: 141/60 136/64  159/71  Pulse: 80 73  81  Temp: 99.1 F (37.3 C) 99.1 F (37.3 C)  98.9 F (37.2 C)  TempSrc: Oral Oral  Oral  Resp: 18 16  16   Height:      Weight:    170 lb 6.7 oz (77.3 kg)  SpO2: 98% 100% 99% 94%   No change in exam. 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 The small wound on the heel is dry. Palpable left popliteal pulse.  LABS: Lab Results  Component Value Date   WBC 11.5* 01/05/2013   HGB 7.8* 01/05/2013   HCT 26.1* 01/05/2013   MCV 86.7 01/05/2013   PLT 311 01/05/2013   CBG (last 3)   Recent Labs  01/05/13 1211 01/05/13 1738 01/05/13 2122  GLUCAP 198* 241* 140*    Active Problems:   DIABETES-TYPE 2   HYPERTENSION   COPD   Hypothyroidism (acquired)   Critical lower limb ischemia, with ulcer Lt foot- S/P atherectomy, PTA/stenting 12/10/12   PVD, Lt SFA stenting 12/10/12   Leukocytosis   Weakness   Skin rash   Gout attack   ASSESSMENT AND PLAN: 1. I can proceed with left transmetatarsal amputation on Tuesday. I have explained that there is a 15% chance of this not healing in which case she would require a below the knee amputation.  Cari Caraway Beeper: [phone] 01/06/2013

## 2013-01-07 ENCOUNTER — Encounter (HOSPITAL_COMMUNITY): Payer: Medicare Other

## 2013-01-07 LAB — GLUCOSE, CAPILLARY: Glucose-Capillary: 100 mg/dL — ABNORMAL HIGH (ref 70–99)

## 2013-01-07 LAB — CBC
Hemoglobin: 8 g/dL — ABNORMAL LOW (ref 12.0–15.0)
MCH: 26.5 pg (ref 26.0–34.0)
Platelets: 270 10*3/uL (ref 150–400)
RBC: 3.02 MIL/uL — ABNORMAL LOW (ref 3.87–5.11)
WBC: 12.1 10*3/uL — ABNORMAL HIGH (ref 4.0–10.5)

## 2013-01-07 LAB — SURGICAL PCR SCREEN: Staphylococcus aureus: NEGATIVE

## 2013-01-07 LAB — CULTURE, BLOOD (ROUTINE X 2)
Culture: NO GROWTH
Culture: NO GROWTH

## 2013-01-07 MED ORDER — ENOXAPARIN SODIUM 40 MG/0.4ML ~~LOC~~ SOLN
40.0000 mg | SUBCUTANEOUS | Status: DC
  Administered 2013-01-09 – 2013-01-10 (×2): 40 mg via SUBCUTANEOUS
  Filled 2013-01-07 (×3): qty 0.4

## 2013-01-07 MED ORDER — DEXTROSE 5 % IV SOLN
1.5000 g | INTRAVENOUS | Status: AC
  Administered 2013-01-08: 1.5 g via INTRAVENOUS
  Filled 2013-01-07 (×2): qty 1.5

## 2013-01-07 MED ORDER — PREDNISONE 20 MG PO TABS
20.0000 mg | ORAL_TABLET | Freq: Every day | ORAL | Status: DC
  Administered 2013-01-07: 20 mg via ORAL
  Filled 2013-01-07 (×3): qty 1

## 2013-01-07 MED ORDER — ALUM & MAG HYDROXIDE-SIMETH 200-200-20 MG/5ML PO SUSP
15.0000 mL | Freq: Four times a day (QID) | ORAL | Status: DC | PRN
  Administered 2013-01-07 – 2013-01-10 (×2): 15 mL via ORAL
  Filled 2013-01-07 (×2): qty 30

## 2013-01-07 NOTE — Progress Notes (Signed)
dulera given 

## 2013-01-07 NOTE — Progress Notes (Signed)
Getting to start blood transfusion and pt. Temp. Elevated.  VSS - Blood pressure 130/52, pulse 84, temperature 99.3 F (37.4 C), temperature source Oral, resp. rate 18, height 5\' 4"  (1.626 m), weight 77.1 kg (169 lb 15.6 oz), SpO2 98.00%., R/A.  Spoke to [place], [place] and in formed of temp of 99.3, stated to give tylenol before transfusion.  Orders carried out and will continue to monitor.  Forbes Cellar, RN

## 2013-01-07 NOTE — Progress Notes (Signed)
VASCULAR PROGRESS NOTE  SUBJECTIVE: No complaints  PHYSICAL EXAM: Filed Vitals:   01/06/13 1400 01/06/13 2130 01/07/13 0443 01/07/13 0949  BP: 144/66 164/69 133/74   Pulse: 87 78 77   Temp: 99.7 F (37.6 C) 98.1 F (36.7 C) 98.8 F (37.1 C)   TempSrc: Oral Oral Oral   Resp: 18 17 17   Height:      Weight:   169 lb 15.6 oz (77.1 kg)   SpO2: 97% 98% 95% 98%   No change in exam. 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 and full thickness wound over fifth metatarsal  LABS: Lab Results  Component Value Date   WBC 12.1* 01/07/2013   HGB 8.0* 01/07/2013   HCT 26.6* 01/07/2013   MCV 88.1 01/07/2013   PLT 270 01/07/2013   Lab Results  Component Value Date   CREATININE 0.90 01/01/2013   Lab Results  Component Value Date   INR 1.19 12/10/2012   CBG (last 3)   Recent Labs  01/06/13 1708 01/06/13 2127 01/07/13 0745  GLUCAP 213* 215* 112*    Principal Problem:   Foot ulcer, left Active Problems:   DIABETES-TYPE 2   HYPERTENSION   COPD   Hypothyroidism (acquired)   Critical lower limb ischemia, with ulcer Lt foot- S/P atherectomy, PTA/stenting 12/10/12   PVD, Lt SFA stenting 12/10/12   Leukocytosis   Weakness   Skin rash   Gout attack   ASSESSMENT AND PLAN: 1.She is agreeable to proceed with left transmetatarsal amputation tomorrow. I have explained that given her tibial artery occlusive disease, there is a 15% chance of nonhealing in which case she would require a below the knee amputation.  Chris Dickson Beeper: [phone] 01/07/2013    

## 2013-01-07 NOTE — Progress Notes (Signed)
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 - Recent SFA stent 12/10/12.   - consulted vascular, planned transmetatarsal amputation on 2.11.2014.  Appreciate Vascular Consultation - Blood Cultures negative  (Final) - d/c vancomycin as blood cultures are negative, and Ms Mcswain has remained afebrile. - patient refuses to take her plavix as she this is causing this rash. She is on an 81 mg asa.  Swollen painfull left knee - resolved - Now on prednisone taper.  Skin Rash in the setting of psoriasis - appears stable.  No significant changes.  Refer to her dermatologist at [facility] on D/C - ? Drug reaction vs psoriasis, d/c vancomycin BC negative no fevers, d/c avapro and HCTZ.  - Patient refuses to take plavix as she think is causing her rash.  Anemia - Hbg droping slowly. Type and screen. Transfuse one unit PRBCs 01/07/13. - 81 mg asa - On Iron supplementation (with stool softeners) - Will monitor.  COPD - stable - On home medication regimen (albuterol inh, fluticasone, Advair, theophylline)  DM 2 - moderate control. Hgb A1C 7.2 - Will change regimen to lantus 40 unit, SS-Moderate and meal coverage - Normally on only glyburide at home.  Need to reconsider her home DM regimen.  HTN - d/c  Avapro - HCTZ - Moderate control.  Possibly elevated due to anxiety and pain.  Hx of Barretts Esophagus - PPI.  Anxiety - Low dose ativan PRN  Code Status: Full Family Communication: Disposition Plan: to be determined (home vs SNF) when appropriate.   Consultants:  Cardiology [facility], Dr. Allyson Sabal  Orthopedic Surgery, Dr. Rennis Chris  Vascular Surgery, Dr. Edilia Bo  HPI/Subjective: Complaining of right arm pain starting at her shoulder and extending to her fingers on  her right hand.  She reports she had similar symptoms on the left upper ext. Which were remedied with carpel tunnel release.  Objective: Filed Vitals:   01/06/13 1400 01/06/13 2130 01/07/13 0443 01/07/13 0949  BP: 144/66 164/69 133/74   Pulse: 87 78 77   Temp: 99.7 F (37.6 C) 98.1 F (36.7 C) 98.8 F (37.1 C)   TempSrc: Oral Oral Oral   Resp: 18 17 17    Height:      Weight:   77.1 kg (169 lb 15.6 oz)   SpO2: 97% 98% 95% 98%    Intake/Output Summary (Last 24 hours) at 01/07/13 1032 Last data filed at 01/07/13 0200  Gross per 24 hour  Intake    460 ml  Output    950 ml  Net   -490 ml   Filed Weights   01/04/13 0545 01/06/13 0514 01/07/13 0443  Weight: 75.615 kg (166 lb 11.2 oz) 77.3 kg (170 lb 6.7 oz) 77.1 kg (169 lb 15.6 oz)    Exam:   General:  WD AA female, sitting up in bed, She appears very stable.  Cardiovascular: S1, S2, no m/r/g  Respiratory: No accessory muscle use, no wheeze  Abdomen: soft, nt, nd, +BS, no masses  Skin:  Pervasive body rash faintly red macules, confluent over her lower back, also sporadically located on her face, arms and abdomen.  Legs are covered with scale,  Skin shedding on bed sheet and floor  Extremitites:  Extensive scaling of the skin.  Left knee edema improved.  Not nearly as tender. Foot wrapped  in gauze, first two digits are blackened, but not cold.   Data Reviewed: Basic Metabolic Panel:  Recent Labs Lab 01/01/13 1229 01/01/13 2113  NA 135  --   K 4.1  --   CL 101  --   CO2 22  --   GLUCOSE 212*  --   BUN 15  --   CREATININE 0.78 0.90  CALCIUM 9.3  --    Liver Function Tests:  Recent Labs Lab 01/01/13 1229  AST 25  ALT 35  ALKPHOS 112  BILITOT 0.4  PROT 6.7  ALBUMIN 2.6*   CBC:  Recent Labs Lab 01/01/13 1229 01/01/13 2113 01/03/13 0610 01/05/13 0620 01/07/13 0605  WBC 13.1* 12.4* 15.9* 11.5* 12.1*  NEUTROABS 10.4*  --   --   --   --   HGB 7.6* 8.0* 7.7* 7.8* 8.0*  HCT 25.5* 26.2* 24.7* 26.1*  26.6*  MCV 87.3 87.0 84.6 86.7 88.1  PLT 314 323 309 311 270   Cardiac Enzymes:  Recent Labs Lab 01/01/13 1743 01/01/13 2326 01/02/13 0622  TROPONINI <0.30 <0.30 <0.30   CBG:  Recent Labs Lab 01/06/13 0723 01/06/13 1122 01/06/13 1708 01/06/13 2127 01/07/13 0745  GLUCAP 79 172* 213* 215* 112*    Recent Results (from the past 240 hour(s))  CULTURE, BLOOD (ROUTINE X 2)     Status: None   Collection Time    01/01/13  5:45 PM      Result Value Range Status   Specimen Description BLOOD ARM LEFT   Final   Special Requests BOTTLES DRAWN AEROBIC AND ANAEROBIC 10CC   Final   Culture  Setup Time 01/01/2013 23:47   Final   Culture NO GROWTH 5 DAYS   Final   Report Status 01/07/2013 FINAL   Final  CULTURE, BLOOD (ROUTINE X 2)     Status: None   Collection Time    01/01/13  6:00 PM      Result Value Range Status   Specimen Description BLOOD HAND LEFT   Final   Special Requests BOTTLES DRAWN AEROBIC ONLY 10CC   Final   Culture  Setup Time 01/01/2013 23:47   Final   Culture NO GROWTH 5 DAYS   Final   Report Status 01/07/2013 FINAL   Final     Studies: No results found.  Scheduled Meds: . aspirin EC  81 mg Oral Daily  . docusate sodium  100 mg Oral BID  . enoxaparin (LOVENOX) injection  40 mg Subcutaneous Q24H  . hydrOXYzine  50 mg Oral TID  . insulin aspart  0-20 Units Subcutaneous TID WC  . insulin aspart  0-5 Units Subcutaneous QHS  . insulin aspart  4 Units Subcutaneous TID WC  . insulin glargine  40 Units Subcutaneous QHS  . mometasone-formoterol  2 puff Inhalation BID  . pantoprazole  20 mg Oral Q1200  . predniSONE  20 mg Oral Q breakfast  . sodium chloride  3 mL Intravenous Q12H  . theophylline  200 mg Oral BID  . Travoprost (BAK Free)  1 drop Both Eyes QHS   Continuous Infusions:   Principal Problem:   Foot ulcer, left Active Problems:   DIABETES-TYPE 2   HYPERTENSION   COPD   Hypothyroidism (acquired)   Critical lower limb ischemia, with ulcer Lt  foot- S/P atherectomy, PTA/stenting 12/10/12   PVD, Lt SFA stenting 12/10/12   Leukocytosis   Weakness   Skin rash   Gout attack    Time spent: 30 min  Conley Canal [facility] Pager [phone]. If 8PM-8AM, contact night-coverage at [email or web address], password [facility] 01/07/2013, 10:32 AM  LOS: 6 days

## 2013-01-08 ENCOUNTER — Encounter (HOSPITAL_COMMUNITY): Payer: Self-pay | Admitting: Anesthesiology

## 2013-01-08 ENCOUNTER — Encounter (HOSPITAL_COMMUNITY)
Admission: EM | Disposition: A | Discharge status: Skilled Nursing Facility | Payer: Self-pay | Source: Home / Self Care | Attending: Internal Medicine

## 2013-01-08 ENCOUNTER — Inpatient Hospital Stay (HOSPITAL_COMMUNITY): Payer: Medicare Other | Admitting: Anesthesiology

## 2013-01-08 DIAGNOSIS — I999 Unspecified disorder of circulatory system: Secondary | ICD-10-CM

## 2013-01-08 DIAGNOSIS — L97509 Non-pressure chronic ulcer of other part of unspecified foot with unspecified severity: Secondary | ICD-10-CM

## 2013-01-08 HISTORY — PX: TRANSMETATARSAL AMPUTATION: SHX6197

## 2013-01-08 LAB — TYPE AND SCREEN
Antibody Screen: NEGATIVE
Unit division: 0

## 2013-01-08 LAB — CBC
HCT: 29 % — ABNORMAL LOW (ref 36.0–46.0)
Hemoglobin: 9 g/dL — ABNORMAL LOW (ref 12.0–15.0)
MCV: 87.1 fL (ref 78.0–100.0)
WBC: 11.1 10*3/uL — ABNORMAL HIGH (ref 4.0–10.5)

## 2013-01-08 LAB — GLUCOSE, CAPILLARY
Glucose-Capillary: 120 mg/dL — ABNORMAL HIGH (ref 70–99)
Glucose-Capillary: 140 mg/dL — ABNORMAL HIGH (ref 70–99)
Glucose-Capillary: 106 mg/dL — ABNORMAL HIGH (ref 70–99)

## 2013-01-08 LAB — BASIC METABOLIC PANEL
BUN: 17 mg/dL (ref 6–23)
CO2: 27 mEq/L (ref 19–32)
Chloride: 103 mEq/L (ref 96–112)
Glucose, Bld: 107 mg/dL — ABNORMAL HIGH (ref 70–99)
Potassium: 4 mEq/L (ref 3.5–5.1)

## 2013-01-08 SURGERY — AMPUTATION, FOOT, TRANSMETATARSAL
Anesthesia: General | Site: Foot | Laterality: Left | Wound class: Dirty or Infected

## 2013-01-08 MED ORDER — ARTIFICIAL TEARS OP OINT: TOPICAL_OINTMENT | OPHTHALMIC | Status: DC | PRN

## 2013-01-08 MED ORDER — 0.9 % SODIUM CHLORIDE (POUR BTL) OPTIME
TOPICAL | Status: DC | PRN
  Administered 2013-01-08: 1000 mL

## 2013-01-08 MED ORDER — LACTATED RINGERS IV SOLN: INTRAVENOUS | Status: DC

## 2013-01-08 MED ORDER — ONDANSETRON HCL 4 MG/2ML IJ SOLN
INTRAMUSCULAR | Status: DC | PRN
  Administered 2013-01-08: 4 mg via INTRAVENOUS

## 2013-01-08 MED ORDER — ARTIFICIAL TEARS OP OINT
TOPICAL_OINTMENT | OPHTHALMIC | Status: DC | PRN
  Administered 2013-01-08: 1 via OPHTHALMIC

## 2013-01-08 MED ORDER — PROMETHAZINE HCL 25 MG/ML IJ SOLN: 6.2500 mg | INTRAMUSCULAR | Status: DC | PRN

## 2013-01-08 MED ORDER — OXYCODONE HCL 5 MG/5ML PO SOLN: 5.0000 mg | Freq: Once | ORAL | Status: DC | PRN

## 2013-01-08 MED ORDER — LACTATED RINGERS IV SOLN
INTRAVENOUS | Status: DC | PRN
  Administered 2013-01-08 (×2): via INTRAVENOUS

## 2013-01-08 MED ORDER — BACITRACIN ZINC 500 UNIT/GM EX OINT
TOPICAL_OINTMENT | CUTANEOUS | Status: AC
  Filled 2013-01-08: qty 15

## 2013-01-08 MED ORDER — HYDROMORPHONE HCL PF 1 MG/ML IJ SOLN
INTRAMUSCULAR | Status: AC
  Filled 2013-01-08: qty 1

## 2013-01-08 MED ORDER — HYDROMORPHONE HCL PF 1 MG/ML IJ SOLN
0.2500 mg | INTRAMUSCULAR | Status: DC | PRN
  Administered 2013-01-08 (×4): 0.5 mg via INTRAVENOUS

## 2013-01-08 MED ORDER — PROPOFOL 10 MG/ML IV BOLUS
INTRAVENOUS | Status: DC | PRN
  Administered 2013-01-08: 120 mg via INTRAVENOUS
  Administered 2013-01-08: 40 mg via INTRAVENOUS

## 2013-01-08 MED ORDER — BACITRACIN ZINC 500 UNIT/GM EX OINT
TOPICAL_OINTMENT | CUTANEOUS | Status: DC | PRN
  Administered 2013-01-08: 1 via TOPICAL

## 2013-01-08 MED ORDER — PREDNISONE 10 MG PO TABS
10.0000 mg | ORAL_TABLET | Freq: Every day | ORAL | Status: DC
  Administered 2013-01-09 – 2013-01-10 (×2): 10 mg via ORAL
  Filled 2013-01-08 (×4): qty 1

## 2013-01-08 MED ORDER — FENTANYL CITRATE 0.05 MG/ML IJ SOLN
INTRAMUSCULAR | Status: DC | PRN
  Administered 2013-01-08 (×5): 50 ug via INTRAVENOUS

## 2013-01-08 MED ORDER — EPHEDRINE SULFATE 50 MG/ML IJ SOLN
INTRAMUSCULAR | Status: DC | PRN
  Administered 2013-01-08: 10 mg via INTRAVENOUS

## 2013-01-08 MED ORDER — PREDNISONE 10 MG PO TABS: 10.0000 mg | ORAL_TABLET | Freq: Every day | ORAL | Status: DC

## 2013-01-08 MED ORDER — OXYCODONE HCL 5 MG PO TABS: 5.0000 mg | ORAL_TABLET | Freq: Once | ORAL | Status: DC | PRN

## 2013-01-08 SURGICAL SUPPLY — 39 items
BANDAGE CONFORM 3  STR LF (GAUZE/BANDAGES/DRESSINGS) ×1 IMPLANT
BANDAGE ELASTIC 4 VELCRO ST LF (GAUZE/BANDAGES/DRESSINGS) ×2 IMPLANT
BANDAGE GAUZE ELAST BULKY 4 IN (GAUZE/BANDAGES/DRESSINGS) ×2 IMPLANT
BLADE AVERAGE 25X9 (BLADE) ×1 IMPLANT
BLADE SURG 10 STRL SS (BLADE) ×1 IMPLANT
CANISTER SUCTION 2500CC (MISCELLANEOUS) ×2 IMPLANT
CLIP TI MEDIUM 6 (CLIP) ×1 IMPLANT
CLOTH BEACON ORANGE TIMEOUT ST (SAFETY) ×2 IMPLANT
COVER SURGICAL LIGHT HANDLE (MISCELLANEOUS) ×2 IMPLANT
DRAPE EXTREMITY T 121X128X90 (DRAPE) ×2 IMPLANT
DRSG EMULSION OIL 3X3 NADH (GAUZE/BANDAGES/DRESSINGS) ×1 IMPLANT
ELECT REM PT RETURN 9FT ADLT (ELECTROSURGICAL) ×2
ELECTRODE REM PT RTRN 9FT ADLT (ELECTROSURGICAL) ×1 IMPLANT
GLOVE BIO SURGEON STRL SZ 6.5 (GLOVE) ×2 IMPLANT
GLOVE BIO SURGEON STRL SZ7.5 (GLOVE) ×2 IMPLANT
GLOVE BIOGEL PI IND STRL 6.5 (GLOVE) IMPLANT
GLOVE BIOGEL PI IND STRL 7.0 (GLOVE) IMPLANT
GLOVE BIOGEL PI IND STRL 8 (GLOVE) ×1 IMPLANT
GLOVE BIOGEL PI INDICATOR 6.5 (GLOVE) ×1
GLOVE BIOGEL PI INDICATOR 7.0 (GLOVE) ×2
GLOVE BIOGEL PI INDICATOR 8 (GLOVE) ×1
GOWN STRL NON-REIN LRG LVL3 (GOWN DISPOSABLE) ×5 IMPLANT
KIT BASIN OR (CUSTOM PROCEDURE TRAY) ×2 IMPLANT
KIT ROOM TURNOVER OR (KITS) ×2 IMPLANT
NS IRRIG 1000ML POUR BTL (IV SOLUTION) ×2 IMPLANT
PACK GENERAL/GYN (CUSTOM PROCEDURE TRAY) ×2 IMPLANT
PAD ARMBOARD 7.5X6 YLW CONV (MISCELLANEOUS) ×4 IMPLANT
SPECIMEN JAR MEDIUM (MISCELLANEOUS) ×2 IMPLANT
SPONGE GAUZE 4X4 12PLY (GAUZE/BANDAGES/DRESSINGS) ×2 IMPLANT
STAPLER VISISTAT 35W (STAPLE) ×1 IMPLANT
SUT ETHILON 3 0 PS 1 (SUTURE) ×2 IMPLANT
SUT VIC AB 3-0 SH 27 (SUTURE) ×2
SUT VIC AB 3-0 SH 27X BRD (SUTURE) IMPLANT
SWAB COLLECTION DEVICE MRSA (MISCELLANEOUS) IMPLANT
TOWEL OR 17X24 6PK STRL BLUE (TOWEL DISPOSABLE) ×2 IMPLANT
TOWEL OR 17X26 10 PK STRL BLUE (TOWEL DISPOSABLE) ×2 IMPLANT
TUBE ANAEROBIC SPECIMEN COL (MISCELLANEOUS) IMPLANT
UNDERPAD 30X30 INCONTINENT (UNDERPADS AND DIAPERS) ×2 IMPLANT
WATER STERILE IRR 1000ML POUR (IV SOLUTION) ×2 IMPLANT

## 2013-01-08 NOTE — Transfer of Care (Signed)
Immediate Anesthesia Transfer of Care Note  Patient: [name]  Procedure(s) Performed: Procedure(s): TRANSMETATARSAL AMPUTATION (Left)  Patient Location: PACU  Anesthesia Type:General  Level of Consciousness: awake, alert , oriented and sedated  Airway & Oxygen Therapy: Patient Spontanous Breathing and Patient connected to nasal cannula oxygen  Post-op Assessment: Report given to PACU RN, Post -op Vital signs reviewed and stable and Patient moving all extremities  Post vital signs: Reviewed and stable  Complications: No apparent anesthesia complications

## 2013-01-08 NOTE — Preoperative (Signed)
Beta Blockers   Reason not to administer Beta Blockers:Not Applicable 

## 2013-01-08 NOTE — Progress Notes (Signed)
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 - Recent SFA stent 12/10/12.   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 Edilia Bo.   - Blood Cultures negative  (Final) - d/c vancomycin as blood cultures are negative, and [name] has remained afebrile. - patient refuses to take her plavix as she believes this is causing the rash. She is on an 81 mg asa.  Swollen painfull left knee - resolved - Now on prednisone taper. Will be complete on 2/13  Skin Rash in the setting of psoriasis - appears stable.  No significant changes.  Refer to her dermatologist at [facility] on D/C - ? Drug reaction vs psoriasis, d/c vancomycin BC negative no fevers, d/c avapro and HCTZ.  - Patient refuses to take plavix as she think is causing her rash.  Anemia - Transfuse one unit PRBCs 01/07/13. - 9.0 after transfusion. - 81 mg asa - On Iron supplementation (with stool softeners) - Will monitor.  COPD - stable - On home medication regimen (albuterol inh, fluticasone, Advair, theophylline)  DM 2 - moderate control. Hgb A1C 7.2 - Will change regimen to lantus 40 unit, SS-Moderate and meal coverage - Normally on only glyburide at home.  Need to reconsider her home DM regimen.  HTN - d/c  Avapro - HCTZ - Moderate control. SBP 130 - 150  Hx of Barretts Esophagus - PPI.  Anxiety - Low dose ativan PRN  Code Status: Full Family Communication: Disposition Plan: d/c to SNF when appropriate. (2/12?)   Consultants:  Cardiology [facility], Dr. Allyson Sabal  Orthopedic Surgery, Dr. Rennis Chris  Vascular Surgery, Dr. Edilia Bo  HPI/Subjective: Looks good this morning - receiving multiple phone calls and well wishes for surgery today.  No complaints.  Objective: Filed Vitals:    01/07/13 1620 01/07/13 2158 01/08/13 0625 01/08/13 1100  BP: 151/62 155/65 148/70 150/62  Pulse: 81 75 64 64  Temp: 99.2 F (37.3 C) 98.2 F (36.8 C) 98.1 F (36.7 C) 99.1 F (37.3 C)  TempSrc: Oral Oral Oral Oral  Resp: 20 20 18 20   Height:      Weight:   77.474 kg (170 lb 12.8 oz)   SpO2:  98% 95% 98%    Intake/Output Summary (Last 24 hours) at 01/08/13 1317 Last data filed at 01/08/13 1300  Gross per 24 hour  Intake 1076.67 ml  Output    275 ml  Net 801.67 ml   Filed Weights   01/06/13 0514 01/07/13 0443 01/08/13 0625  Weight: 77.3 kg (170 lb 6.7 oz) 77.1 kg (169 lb 15.6 oz) 77.474 kg (170 lb 12.8 oz)    Exam:   General:  WD AA female, lying in bed on the telephone, smiling  Cardiovascular: rrr,  no m/r/g, LLE swelling minimal  Respiratory: No accessory muscle use, no wheeze  Abdomen: soft, nt, nd, +BS, no masses  Skin:  Pervasive body rash faintly red macules, confluent over her lower back, also sporadically located on her face, arms and abdomen.  Legs are covered with scale,  Skin shedding on bed sheet and floor  Extremitites:  Extensive scaling of the skin.  Left knee edema improved.  Not nearly as tender. Foot wrapped in gauze, first two digits are blackened   Data Reviewed: Basic Metabolic Panel:  Recent Labs Lab 01/01/13 2113  01/08/13 0502  NA  --  139  K  --  4.0  CL  --  103  CO2  --  27  GLUCOSE  --  107*  BUN  --  17  CREATININE 0.90 0.81  CALCIUM  --  9.3   CBC:  Recent Labs Lab 01/01/13 2113 01/03/13 0610 01/05/13 0620 01/07/13 0605 01/08/13 0502  WBC 12.4* 15.9* 11.5* 12.1* 11.1*  HGB 8.0* 7.7* 7.8* 8.0* 9.0*  HCT 26.2* 24.7* 26.1* 26.6* 29.0*  MCV 87.0 84.6 86.7 88.1 87.1  PLT 323 309 311 270 276   Cardiac Enzymes:  Recent Labs Lab 01/01/13 1743 01/01/13 2326 01/02/13 0622  TROPONINI <0.30 <0.30 <0.30   CBG:  Recent Labs Lab 01/07/13 1208 01/07/13 1734 01/07/13 2240 01/08/13 0825 01/08/13 1124  GLUCAP 177*  257* 100* 120* 133*    Recent Results (from the past 240 hour(s))  CULTURE, BLOOD (ROUTINE X 2)     Status: None   Collection Time    01/01/13  5:45 PM      Result Value Range Status   Specimen Description BLOOD ARM LEFT   Final   Special Requests BOTTLES DRAWN AEROBIC AND ANAEROBIC 10CC   Final   Culture  Setup Time 01/01/2013 23:47   Final   Culture NO GROWTH 5 DAYS   Final   Report Status 01/07/2013 FINAL   Final  CULTURE, BLOOD (ROUTINE X 2)     Status: None   Collection Time    01/01/13  6:00 PM      Result Value Range Status   Specimen Description BLOOD HAND LEFT   Final   Special Requests BOTTLES DRAWN AEROBIC ONLY 10CC   Final   Culture  Setup Time 01/01/2013 23:47   Final   Culture NO GROWTH 5 DAYS   Final   Report Status 01/07/2013 FINAL   Final  SURGICAL PCR SCREEN     Status: None   Collection Time    01/07/13  2:30 PM      Result Value Range Status   MRSA, PCR NEGATIVE  NEGATIVE Final   Staphylococcus aureus NEGATIVE  NEGATIVE Final   Comment:            The Xpert SA Assay (FDA     approved for NASAL specimens     in patients over 50 years of age),     is one component of     a comprehensive surveillance     program.  Test performance has     been validated by [employer] for patients greater     than or equal to 34 year old.     It is not intended     to diagnose infection nor to     guide or monitor treatment.     Studies: No results found.  Scheduled Meds: . [facility] HOLD] aspirin EC  81 mg Oral Daily  . [facility] HOLD] docusate sodium  100 mg Oral BID  . [MAR HOLD] enoxaparin (LOVENOX) injection  40 mg Subcutaneous Q24H  . [facility] HOLD] hydrOXYzine  50 mg Oral TID  . [MAR HOLD] insulin aspart  0-20 Units Subcutaneous TID WC  . [MAR HOLD] insulin aspart  0-5 Units Subcutaneous QHS  . [MAR HOLD] insulin aspart  4 Units Subcutaneous TID WC  . [MAR HOLD] insulin glargine  40 Units Subcutaneous QHS  . [MAR HOLD] mometasone-formoterol  2 puff Inhalation BID   . [facility] HOLD] pantoprazole  20 mg Oral Q1200  . [MAR HOLD] predniSONE  20 mg Oral Q breakfast  . [MAR HOLD] sodium chloride  3 mL Intravenous Q12H  . [facility] HOLD] theophylline  200 mg Oral BID  . [MAR HOLD] Travoprost (BAK Free)  1 drop Both Eyes QHS   Continuous Infusions: . lactated ringers      Principal Problem:   Foot ulcer, left Active Problems:   DIABETES-TYPE 2   HYPERTENSION   COPD   Hypothyroidism (acquired)   Critical lower limb ischemia, with ulcer Lt foot- S/P atherectomy, PTA/stenting 12/10/12   PVD, Lt SFA stenting 12/10/12   Leukocytosis   Weakness   Skin rash   Gout attack    Time spent: 30 min   Conley Canal [facility] Pager [phone]. If 8PM-8AM, contact night-coverage at [email or web address], password [facility] 01/08/2013, 1:17 PM  LOS: 7 days

## 2013-01-08 NOTE — Progress Notes (Signed)
-   agree with above surgery today. - pain controlled, no major changes.

## 2013-01-08 NOTE — Anesthesia Preprocedure Evaluation (Signed)
Anesthesia Evaluation    Reviewed: Allergy & Precautions, H&P , NPO status , Patient's Chart, lab work & pertinent test results  Airway       Dental   Pulmonary shortness of breath and with exertion, asthma , pneumonia -, resolved, COPD         Cardiovascular hypertension, Pt. on medications + Peripheral Vascular Disease     Neuro/Psych Anxiety negative neurological ROS     GI/Hepatic Neg liver ROS, hiatal hernia, GERD-  Medicated,  Endo/Other  diabetesHypothyroidism   Renal/GU negative Renal ROS     Musculoskeletal   Abdominal   Peds  Hematology   Anesthesia Other Findings   Reproductive/Obstetrics                           Anesthesia Physical Anesthesia Plan  ASA: III  Anesthesia Plan: General   Post-op Pain Management:    Induction: Intravenous  Airway Management Planned: LMA  Additional Equipment:   Intra-op Plan:   Post-operative Plan: Extubation in OR  Informed Consent: I have reviewed the patients History and Physical, chart, labs and discussed the procedure including the risks, benefits and alternatives for the proposed anesthesia with the patient or authorized representative who has indicated his/her understanding and acceptance.   Dental advisory given  Plan Discussed with: CRNA, Anesthesiologist and Surgeon  Anesthesia Plan Comments:         Anesthesia Quick Evaluation

## 2013-01-08 NOTE — Progress Notes (Signed)
-   agree with plana as above. - rash appears stable. - pain controlled.

## 2013-01-08 NOTE — Op Note (Signed)
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 Edilia Bo, MD, FACS  ASSIST: Doreatha Massed, PA  ANESTHESIA: Gen.   EBL: minimal  INDICATIONS: [name] is a 72 y.o. female who had undergone endovascular revascularization by Dr. Allyson Sabal. 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 She presents for transmetatarsal amputation.  FINDINGS: there was no gross infection present. The tissue appeared reasonably well perfused.  TECHNIQUE: The patient was taken to the operating room and received a general anesthetic. The left foot was prepped and draped in the usual sterile fashion the incision was made on the dorsum of the foot across the distal metatarsal head. A longer dorsal flap was incised. Dissection on the dorsal aspect was taken down to the metatarsal bones. The periosteum was elevated. The bone was divided proximal to the level of the skin division using a TPS saw. The forefoot was removed. The tendons were retracted into the wound and divided. The digital arteries were clipped. The wound was irrigated with copious amounts of saline. The deep layer was closed with 3-0 Vicryl's. Skin was closed with staples. A sterile dressing was applied. The patient tolerated the procedure well and was transferred to the recovery room in stable condition. All needle and sponge counts were correct.  Waverly Ferrari, MD, FACS [facility]  DATE OF DICTATION:   01/08/2013

## 2013-01-08 NOTE — Progress Notes (Signed)
PT Cancellation Note  Patient Details Name: [name] MRN: [id number] DOB: 27-Dec-1940   Cancelled Treatment:    Reason Eval/Treat Not Completed: Medical issues which prohibited therapy (Pt for transmetatarsal amputation today.)   Nikole Swartzentruber 01/08/2013, 10:01 AM

## 2013-01-08 NOTE — Anesthesia Postprocedure Evaluation (Signed)
Anesthesia Post Note  Patient: [name]  Procedure(s) Performed: Procedure(s) (LRB): TRANSMETATARSAL AMPUTATION (Left)  Anesthesia type: general  Patient location: PACU  Post pain: Pain level controlled  Post assessment: Patient's Cardiovascular Status Stable  Last Vitals:  Filed Vitals:   01/08/13 1427  BP:   Pulse: 82  Temp: 36.9 C  Resp: 16    Post vital signs: Reviewed and stable  Level of consciousness: sedated  Complications: No apparent anesthesia complications

## 2013-01-08 NOTE — H&P (View-Only) (Signed)
VASCULAR PROGRESS NOTE  SUBJECTIVE: No complaints  PHYSICAL EXAM: Filed Vitals:   01/06/13 1400 01/06/13 2130 01/07/13 0443 01/07/13 0949  BP: 144/66 164/69 133/74   Pulse: 87 78 77   Temp: 99.7 F (37.6 C) 98.1 F (36.7 C) 98.8 F (37.1 C)   TempSrc: Oral Oral Oral   Resp: 18 17 17    Height:      Weight:   169 lb 15.6 oz (77.1 kg)   SpO2: 97% 98% 95% 98%   No change in exam. 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 and full thickness wound over fifth metatarsal  LABS: Lab Results  Component Value Date   WBC 12.1* 01/07/2013   HGB 8.0* 01/07/2013   HCT 26.6* 01/07/2013   MCV 88.1 01/07/2013   PLT 270 01/07/2013   Lab Results  Component Value Date   CREATININE 0.90 01/01/2013   Lab Results  Component Value Date   INR 1.19 12/10/2012   CBG (last 3)   Recent Labs  01/06/13 1708 01/06/13 2127 01/07/13 0745  GLUCAP 213* 215* 112*    Principal Problem:   Foot ulcer, left Active Problems:   DIABETES-TYPE 2   HYPERTENSION   COPD   Hypothyroidism (acquired)   Critical lower limb ischemia, with ulcer Lt foot- S/P atherectomy, PTA/stenting 12/10/12   PVD, Lt SFA stenting 12/10/12   Leukocytosis   Weakness   Skin rash   Gout attack   ASSESSMENT AND PLAN: 1.She is agreeable to proceed with left transmetatarsal amputation tomorrow. I have explained that given her tibial artery occlusive disease, there is a 15% chance of nonhealing in which case she would require a below the knee amputation.  Cari Caraway Beeper: [phone] 01/07/2013

## 2013-01-08 NOTE — Interval H&P Note (Signed)
History and Physical Interval Note:  01/08/2013 12:06 PM  [name]  has presented today for surgery, with the diagnosis of necrotic left toes  The various methods of treatment have been discussed with the patient and family. After consideration of risks, benefits and other options for treatment, the patient has consented to  Procedure(s): TRANSMETATARSAL AMPUTATION (Left) as a surgical intervention .  The patient's history has been reviewed, patient examined, no change in status, stable for surgery.  I have reviewed the patient's chart and labs.  Questions were answered to the patient's satisfaction.     Kandi Brusseau S

## 2013-01-08 NOTE — Progress Notes (Signed)
Care of pt assumed by MA Tyrian Peart RN 

## 2013-01-08 NOTE — Anesthesia Procedure Notes (Signed)
Procedure Name: LMA Insertion Date/Time: 01/08/2013 12:22 PM Performed by: Fransisca Kaufmann Pre-anesthesia Checklist: Patient identified, Emergency Drugs available, Suction available, Patient being monitored and Timeout performed Patient Re-evaluated:Patient Re-evaluated prior to inductionOxygen Delivery Method: Circle system utilized Preoxygenation: Pre-oxygenation with 100% oxygen Intubation Type: IV induction Ventilation: Mask ventilation without difficulty LMA Size: 4.0 Number of attempts: 1 Placement Confirmation: positive ETCO2 and breath sounds checked- equal and bilateral Tube secured with: Tape Dental Injury: Teeth and Oropharynx as per pre-operative assessment

## 2013-01-09 DIAGNOSIS — M109 Gout, unspecified: Secondary | ICD-10-CM

## 2013-01-09 DIAGNOSIS — I10 Essential (primary) hypertension: Secondary | ICD-10-CM

## 2013-01-09 LAB — GLUCOSE, CAPILLARY
Glucose-Capillary: 138 mg/dL — ABNORMAL HIGH (ref 70–99)
Glucose-Capillary: 152 mg/dL — ABNORMAL HIGH (ref 70–99)
Glucose-Capillary: 157 mg/dL — ABNORMAL HIGH (ref 70–99)
Glucose-Capillary: 188 mg/dL — ABNORMAL HIGH (ref 70–99)

## 2013-01-09 LAB — CBC
MCHC: 30.4 g/dL (ref 30.0–36.0)
RDW: 21.4 % — ABNORMAL HIGH (ref 11.5–15.5)
WBC: 11.7 10*3/uL — ABNORMAL HIGH (ref 4.0–10.5)

## 2013-01-09 MED ORDER — GABAPENTIN 300 MG PO CAPS
300.0000 mg | ORAL_CAPSULE | Freq: Every day | ORAL | Status: DC
  Administered 2013-01-09: 300 mg via ORAL
  Filled 2013-01-09 (×2): qty 1

## 2013-01-09 MED ORDER — HYDROMORPHONE 0.3 MG/ML IV SOLN
INTRAVENOUS | Status: DC
  Administered 2013-01-09: 0.2 mg via INTRAVENOUS
  Administered 2013-01-09: 11:00:00 via INTRAVENOUS
  Administered 2013-01-09: 1.49 mg via INTRAVENOUS
  Administered 2013-01-10: 0.2 mg via INTRAVENOUS
  Administered 2013-01-10: 0.799 mg via INTRAVENOUS
  Administered 2013-01-10: 0.4 mg via INTRAVENOUS
  Filled 2013-01-09: qty 25

## 2013-01-09 MED ORDER — ONDANSETRON HCL 4 MG/2ML IJ SOLN
4.0000 mg | Freq: Four times a day (QID) | INTRAMUSCULAR | Status: DC | PRN
  Administered 2013-01-10: 4 mg via INTRAVENOUS
  Filled 2013-01-09: qty 2

## 2013-01-09 MED ORDER — DIPHENHYDRAMINE HCL 50 MG/ML IJ SOLN: 12.5000 mg | Freq: Four times a day (QID) | INTRAMUSCULAR | Status: DC | PRN

## 2013-01-09 MED ORDER — DIPHENHYDRAMINE HCL 12.5 MG/5ML PO ELIX
12.5000 mg | ORAL_SOLUTION | Freq: Four times a day (QID) | ORAL | Status: DC | PRN
  Filled 2013-01-09: qty 5

## 2013-01-09 MED ORDER — NALOXONE HCL 0.4 MG/ML IJ SOLN: 0.4000 mg | INTRAMUSCULAR | Status: DC | PRN

## 2013-01-09 MED ORDER — SODIUM CHLORIDE 0.9 % IJ SOLN: 9.0000 mL | INTRAMUSCULAR | Status: DC | PRN

## 2013-01-09 NOTE — Progress Notes (Signed)
Orthopedic Tech Progress Note Patient Details:  [name] 01-24-1941 [id number] Gave Darco shoe to nurse.  Pt. Refused placement by [place]. Ortho Devices Type of Ortho Device: Darco shoe Ortho Device/Splint Location: RIGHT WIST SPLINT Ortho Device/Splint Interventions: Application   Lesle Chris 01/09/2013, 11:51 AM

## 2013-01-09 NOTE — Progress Notes (Signed)
Seen and examined, agree with the assessment and plan. Doing well, but complaining of pain at the site of amputation. VVS started on PCA. Start Neurontin,may need low dose Benzo's-will re-assess in am.   S Ghimire

## 2013-01-09 NOTE — Progress Notes (Signed)
[facility] PROGRESS NOTE  [name] HKV:[id number] DOB: [birth year] DOA: 01/01/2013 PCP: Shelby Farber, MD  Assessment/Plan:  Left leg cellulitis/Necrosis of the 1st, 2nd, 5th digit and LLE with non-healing wounds: - Leg  with chronic wounds and black toes. - Recent SFA stent 12/10/12.   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 Edilia Bo.  Patient tolerated the procedure well but is having moderate pain 01/09/13.   - PCA ordered by Orthopedic Surgery. 01/09/13.  Will monitor usage and convert to oral pain medications 01/10/13 - Blood Cultures negative  (Final) - d/c vancomycin as blood cultures are negative, and [name] has remained afebrile.  No antibiotics recommended by Surgery. - patient refuses to take her plavix as she believes this is causing the rash. She is on an 81 mg asa.  Pain -Started neurontin 300 mg X 1 01/09/13.  Taper up over the next two days. -Dilaudid PCA pump ordered by Ortho   Swollen painfull left knee - gout - resolved - Now on prednisone taper. Will be complete on 2/13  Skin Rash in the setting of psoriasis - appears stable.  Still present, still unchanged.   Refer to her dermatologist at [facility] on D/C - ? Drug reaction vs psoriasis, d/c vancomycin BC negative no fevers, d/c avapro and HCTZ.  - Patient refuses to take plavix as she think is causing her rash.  Anemia - Transfuse one unit PRBCs 01/07/13. - 9.0 after transfusion. Stable after amputation at 8.9 - 81 mg asa - On Iron supplementation (with stool softeners) - Will monitor.  COPD - stable - On home medication regimen (albuterol inh, fluticasone, Advair, theophylline)  DM 2 - moderate control. Hgb A1C 7.2 - Will change regimen to lantus 40 unit, SS-Moderate and meal coverage - Will d/c to SNF when appropriate on Insulin.  HTN - d/c  Avapro - HCTZ - Moderate control. SBP 130 - 150  Hx of Barretts Esophagus - PPI.  Anxiety - Low dose ativan PRN  Code  Status: Full Family Communication: Disposition Plan: d/c to SNF when appropriate. (2/13 - 14)   Consultants:  Cardiology [facility], Dr. Allyson Sabal  Orthopedic Surgery, Dr. Rennis Chris  Vascular Surgery, Dr. Edilia Bo  HPI/Subjective: Complaining of pain in foot.  Otherwise no complaints.  Objective: Filed Vitals:   01/08/13 2201 01/09/13 0524 01/09/13 0918 01/09/13 1122  BP: 160/73 148/65    Pulse: 94 87    Temp: 98.4 F (36.9 C) 100.2 F (37.9 C)    TempSrc: Oral Oral    Resp: 18 20  18   Height:      Weight:      SpO2: 92% 95% 94% 94%    Intake/Output Summary (Last 24 hours) at 01/09/13 1140 Last data filed at 01/09/13 0900  Gross per 24 hour  Intake   1443 ml  Output    500 ml  Net    943 ml   Filed Weights   01/06/13 0514 01/07/13 0443 01/08/13 0625  Weight: 77.3 kg (170 lb 6.7 oz) 77.1 kg (169 lb 15.6 oz) 77.474 kg (170 lb 12.8 oz)    Exam:   General:  WD AA female, in bed, trembling slightly with pain and anxiety  Cardiovascular: rrr,  no m/r/g, LLE swelling resolved.  Respiratory: No accessory muscle use, minimal wheeze on left.  Abdomen: soft, nt, nd, +BS, no masses  Skin:  Pervasive body rash faintly red macules, confluent over her lower back, also sporadically located on her face, arms and abdomen.  Legs are covered with scale,  Skin shedding on bed sheet and floor  Extremitites:  Extensive scaling of the skin.  Left knee edema resolved. Foot wrapped in gauze & ace  Data Reviewed: Basic Metabolic Panel:  Recent Labs Lab 01/08/13 0502  NA 139  K 4.0  CL 103  CO2 27  GLUCOSE 107*  BUN 17  CREATININE 0.81  CALCIUM 9.3   CBC:  Recent Labs Lab 01/03/13 0610 01/05/13 0620 01/07/13 0605 01/08/13 0502 01/09/13 0640  WBC 15.9* 11.5* 12.1* 11.1* 11.7*  HGB 7.7* 7.8* 8.0* 9.0* 8.9*  HCT 24.7* 26.1* 26.6* 29.0* 29.3*  MCV 84.6 86.7 88.1 87.1 87.7  PLT 309 311 270 276 256   CBG:  Recent Labs Lab 01/08/13 1347 01/08/13 1639 01/08/13 2158  01/09/13 0809 01/09/13 0920  GLUCAP 140* 140* 106* 70 138*    Recent Results (from the past 240 hour(s))  CULTURE, BLOOD (ROUTINE X 2)     Status: None   Collection Time    01/01/13  5:45 PM      Result Value Range Status   Specimen Description BLOOD ARM LEFT   Final   Special Requests BOTTLES DRAWN AEROBIC AND ANAEROBIC 10CC   Final   Culture  Setup Time 01/01/2013 23:47   Final   Culture NO GROWTH 5 DAYS   Final   Report Status 01/07/2013 FINAL   Final  CULTURE, BLOOD (ROUTINE X 2)     Status: None   Collection Time    01/01/13  6:00 PM      Result Value Range Status   Specimen Description BLOOD HAND LEFT   Final   Special Requests BOTTLES DRAWN AEROBIC ONLY 10CC   Final   Culture  Setup Time 01/01/2013 23:47   Final   Culture NO GROWTH 5 DAYS   Final   Report Status 01/07/2013 FINAL   Final  SURGICAL PCR SCREEN     Status: None   Collection Time    01/07/13  2:30 PM      Result Value Range Status   MRSA, PCR NEGATIVE  NEGATIVE Final   Staphylococcus aureus NEGATIVE  NEGATIVE Final   Comment:            The Xpert SA Assay (FDA     approved for NASAL specimens     in patients over 22 years of age),     is one component of     a comprehensive surveillance     program.  Test performance has     been validated by [employer] for patients greater     than or equal to 49 year old.     It is not intended     to diagnose infection nor to     guide or monitor treatment.     Studies: No results found.  Scheduled Meds: . aspirin EC  81 mg Oral Daily  . docusate sodium  100 mg Oral BID  . enoxaparin (LOVENOX) injection  40 mg Subcutaneous Q24H  . HYDROmorphone PCA 0.3 mg/mL   Intravenous Q4H  . hydrOXYzine  50 mg Oral TID  . insulin aspart  0-20 Units Subcutaneous TID WC  . insulin aspart  0-5 Units Subcutaneous QHS  . insulin aspart  4 Units Subcutaneous TID WC  . insulin glargine  40 Units Subcutaneous QHS  . mometasone-formoterol  2 puff Inhalation BID  .  pantoprazole  20 mg Oral Q1200  . predniSONE  10  mg Oral Q breakfast  . sodium chloride  3 mL Intravenous Q12H  . theophylline  200 mg Oral BID  . Travoprost (BAK Free)  1 drop Both Eyes QHS   Continuous Infusions:    Principal Problem:   Foot ulcer, left Active Problems:   DIABETES-TYPE 2   HYPERTENSION   COPD   Hypothyroidism (acquired)   Critical lower limb ischemia, with ulcer Lt foot- S/P atherectomy, PTA/stenting 12/10/12   PVD, Lt SFA stenting 12/10/12   Leukocytosis   Weakness   Skin rash   Gout attack    Time spent: 30 min   Conley Canal [facility] Pager [phone]. If 8PM-8AM, contact night-coverage at [email or web address], password [facility] 01/09/2013, 11:40 AM  LOS: 8 days

## 2013-01-09 NOTE — Evaluation (Signed)
Physical Therapy Evaluation Patient Details Name: [name] MRN: [id number] DOB: [birth year] Today's Date: 01/09/2013 Time: 2952-8413 PT Time Calculation (min): 11 min  PT Assessment / Plan / Recommendation Clinical Impression  Pt s/p lt transmet amp. Lives with sister but will need ST-SNF prior to return home.    PT Assessment  Patient needs continued PT services    Follow Up Recommendations  SNF    Does the patient have the potential to tolerate intense rehabilitation      Barriers to Discharge        Equipment Recommendations  Other (comment)    Recommendations for Other Services     Frequency Min 3X/week    Precautions / Restrictions Precautions Precautions: Fall Restrictions Weight Bearing Restrictions: Yes RLE Weight Bearing: Partial weight bearing LLE Weight Bearing: Partial weight bearing   Pertinent Vitals/Pain Severe pain in lt foot. Nursing setting up PCA.      Mobility  Bed Mobility Supine to Sit: 4: Min assist;HOB elevated Sitting - Scoot to Edge of Bed: 4: Min assist Details for Bed Mobility Assistance: Assist to bring trunk up and hips to EOB Transfers Transfers: Stand Pivot Transfers Sit to Stand: 1: +2 Total assist;With upper extremity assist;From bed Sit to Stand: Patient Percentage: 70% Stand to Sit: 1: +2 Total assist;With upper extremity assist;With armrests;To chair/3-in-1 Stand Pivot Transfers: 1: +2 Total assist Stand Pivot Transfers: Patient Percentage: 40% Details for Transfer Assistance: Pt unable to bear any weight on lt foot due to pain.  Pt with difficulty making the pivot using the walker.    Exercises     PT Diagnosis: Difficulty walking;Acute pain;Generalized weakness  PT Problem List: Decreased strength;Decreased activity tolerance;Decreased balance;Decreased mobility;Decreased knowledge of use of DME;Pain PT Treatment Interventions: DME instruction;Gait training;Functional mobility training;Therapeutic  activities;Therapeutic exercise;Balance training;Patient/family education   PT Goals Acute Rehab PT Goals PT Goal Formulation: With patient Pt will go Supine/Side to Sit: with modified independence PT Goal: Supine/Side to Sit - Progress: Goal set today Pt will go Sit to Supine/Side: with modified independence PT Goal: Sit to Supine/Side - Progress: Goal set today Pt will go Sit to Stand: with min assist PT Goal: Sit to Stand - Progress: Goal set today Pt will go Stand to Sit: with min assist PT Goal: Stand to Sit - Progress: Goal set today Pt will Transfer Bed to Chair/Chair to Bed: with min assist Pt will Ambulate: 1 - 15 feet;with mod assist;with least restrictive assistive device PT Goal: Ambulate - Progress: Goal set today  Visit Information  Last PT Received On: 01/09/13 Assistance Needed: +2    Subjective Data  Subjective: Pt states she was here not too long ago. Patient Stated Goal: To go home   Prior Functioning  Home Living Lives With: Family Available Help at Discharge: Available PRN/intermittently Type of Home: House Home Access: Stairs to enter [employer] of Steps: 1 Entrance Stairs-Rails: None Home Layout: One level Firefighter: Standard Home Adaptive Equipment: Environmental consultant - four wheeled;Walker - rolling;Straight cane Prior Function Level of Independence: Independent with assistive device(s) Communication Communication: No difficulties    Cognition  Cognition Overall Cognitive Status: Appears within functional limits for tasks assessed/performed Arousal/Alertness: Awake/alert Orientation Level: Oriented X4 / Intact Behavior During Session: [facility] for tasks performed    Extremity/Trunk Assessment Right Lower Extremity Assessment RLE ROM/Strength/Tone: [facility] for tasks assessed Left Lower Extremity Assessment LLE ROM/Strength/Tone: Unable to fully assess;Due to pain   Balance    End of Session PT - End of Session  Equipment Utilized During  Treatment: Gait belt Activity Tolerance: Patient limited by pain Patient left: in chair;with call bell/phone within reach;with nursing in room Nurse Communication: Mobility status  GP     [name] 01/09/2013, 2:08 PM  [facility] PT [phone]

## 2013-01-09 NOTE — Anesthesia Postprocedure Evaluation (Deleted)
Anesthesia Post Note  Patient: [name]  Procedure(s) Performed: Procedure(s) (LRB): TRANSMETATARSAL AMPUTATION (Left)  Anesthesia type: general  Patient location: PACU  Post pain: Pain level controlled  Post assessment: Patient's Cardiovascular Status Stable  Last Vitals:  Filed Vitals:   01/09/13 1200  BP:   Pulse:   Temp:   Resp: 18    Post vital signs: Reviewed and stable  Level of consciousness: sedated  Complications: No apparent anesthesia complications

## 2013-01-09 NOTE — Progress Notes (Signed)
VASCULAR PROGRESS NOTE  SUBJECTIVE: Moderate discomfort  PHYSICAL EXAM: Filed Vitals:   01/08/13 1456 01/08/13 2045 01/08/13 2201 01/09/13 0524  BP: 130/64  160/73 148/65  Pulse: 81  94 87  Temp: 97.9 F (36.6 C)  98.4 F (36.9 C) 100.2 F (37.9 C)  TempSrc: Oral  Oral Oral  Resp:   18 20  Height:      Weight:      SpO2: 95% 94% 92% 95%   Dressing dry  LABS: Lab Results  Component Value Date   WBC 11.1* 01/08/2013   HGB 9.0* 01/08/2013   HCT 29.0* 01/08/2013   MCV 87.1 01/08/2013   PLT 276 01/08/2013   Lab Results  Component Value Date   CREATININE 0.81 01/08/2013   Lab Results  Component Value Date   INR 1.19 12/10/2012   CBG (last 3)   Recent Labs  01/08/13 1347 01/08/13 1639 01/08/13 2158  GLUCAP 140* 140* 106*    Principal Problem:   Foot ulcer, left Active Problems:   DIABETES-TYPE 2   HYPERTENSION   COPD   Hypothyroidism (acquired)   Critical lower limb ischemia, with ulcer Lt foot- S/P atherectomy, PTA/stenting 12/10/12   PVD, Lt SFA stenting 12/10/12   Leukocytosis   Weakness   Skin rash   Gout attack   ASSESSMENT AND PLAN: 1. 1 Day Post-Op s/p: Left TMA 2. PCA for pain 3. Dressing chnge tomorrow and then daily. 4. PTx/ PWB left foot- heel only, starting tomorrow.   Cari Caraway Beeper: [phone] 01/09/2013

## 2013-01-10 ENCOUNTER — Encounter (HOSPITAL_COMMUNITY): Payer: Self-pay | Admitting: Vascular Surgery

## 2013-01-10 DIAGNOSIS — I739 Peripheral vascular disease, unspecified: Secondary | ICD-10-CM

## 2013-01-10 LAB — BASIC METABOLIC PANEL
CO2: 26 mEq/L (ref 19–32)
Glucose, Bld: 167 mg/dL — ABNORMAL HIGH (ref 70–99)
Potassium: 4.3 mEq/L (ref 3.5–5.1)
Sodium: 136 mEq/L (ref 135–145)

## 2013-01-10 LAB — GLUCOSE, CAPILLARY
Glucose-Capillary: 159 mg/dL — ABNORMAL HIGH (ref 70–99)
Glucose-Capillary: 236 mg/dL — ABNORMAL HIGH (ref 70–99)

## 2013-01-10 LAB — CBC
HCT: 30.3 % — ABNORMAL LOW (ref 36.0–46.0)
Hemoglobin: 9 g/dL — ABNORMAL LOW (ref 12.0–15.0)
RBC: 3.44 MIL/uL — ABNORMAL LOW (ref 3.87–5.11)
WBC: 11.5 10*3/uL — ABNORMAL HIGH (ref 4.0–10.5)

## 2013-01-10 MED ORDER — HYDROMORPHONE HCL PF 1 MG/ML IJ SOLN: 0.5000 mg | INTRAMUSCULAR | Status: DC | PRN

## 2013-01-10 MED ORDER — IRBESARTAN 75 MG PO TABS
75.0000 mg | ORAL_TABLET | Freq: Every day | ORAL | Status: DC
  Administered 2013-01-10 – 2013-01-11 (×2): 75 mg via ORAL
  Filled 2013-01-10 (×2): qty 1

## 2013-01-10 MED ORDER — OXYCODONE HCL 5 MG PO TABS
5.0000 mg | ORAL_TABLET | ORAL | Status: DC | PRN
  Administered 2013-01-10 – 2013-01-11 (×2): 5 mg via ORAL
  Filled 2013-01-10: qty 1
  Filled 2013-01-10: qty 3

## 2013-01-10 MED ORDER — HYDROXYZINE HCL 25 MG PO TABS
25.0000 mg | ORAL_TABLET | Freq: Three times a day (TID) | ORAL | Status: DC
  Administered 2013-01-10 – 2013-01-11 (×4): 25 mg via ORAL
  Filled 2013-01-10 (×5): qty 1

## 2013-01-10 MED ORDER — ACETAMINOPHEN 500 MG PO TABS
1000.0000 mg | ORAL_TABLET | Freq: Three times a day (TID) | ORAL | Status: DC
  Administered 2013-01-10 – 2013-01-11 (×3): 1000 mg via ORAL
  Filled 2013-01-10 (×5): qty 2

## 2013-01-10 MED ORDER — OXYCODONE HCL ER 15 MG PO T12A
15.0000 mg | EXTENDED_RELEASE_TABLET | Freq: Two times a day (BID) | ORAL | Status: DC
  Administered 2013-01-10 – 2013-01-11 (×3): 15 mg via ORAL
  Filled 2013-01-10 (×3): qty 1

## 2013-01-10 MED ORDER — LORAZEPAM 0.5 MG PO TABS
0.5000 mg | ORAL_TABLET | Freq: Three times a day (TID) | ORAL | Status: DC
  Administered 2013-01-10 – 2013-01-11 (×4): 0.5 mg via ORAL
  Filled 2013-01-10 (×4): qty 1

## 2013-01-10 MED ORDER — GABAPENTIN 300 MG PO CAPS
300.0000 mg | ORAL_CAPSULE | Freq: Two times a day (BID) | ORAL | Status: DC
  Administered 2013-01-10 – 2013-01-11 (×3): 300 mg via ORAL
  Filled 2013-01-10 (×4): qty 1

## 2013-01-10 NOTE — Progress Notes (Signed)
Patient ID: [name], female   DOB: 1941/01/13, 72 y.o.   MRN: [id number] Vascular Surgery Progress Note  Subjective: 2 days post left TM A. per Dr. Edilia Bo  Objective:  Filed Vitals:   01/10/13 0621  BP: 134/72  Pulse: 79  Temp: 99.8 F (37.7 C)  Resp: 22    Left foot wound examined and healing satisfactorily-redressed  Labs:  Recent Labs Lab 01/08/13 0502  CREATININE 0.81    Recent Labs Lab 01/08/13 0502  NA 139  K 4.0  CL 103  CO2 27  BUN 17  CREATININE 0.81  GLUCOSE 107*  CALCIUM 9.3    Recent Labs Lab 01/07/13 0605 01/08/13 0502 01/09/13 0640  WBC 12.1* 11.1* 11.7*  HGB 8.0* 9.0* 8.9*  HCT 26.6* 29.0* 29.3*  PLT 270 276 256   No results found for this basename: INR,  in the last 168 hours  I/O last 3 completed shifts: In: 243 [P.O.:240; I.V.:3] Out: -   Imaging: No results found.  Assessment/Plan:  POD #2  LOS: 9 days  s/p Procedure(s): TRANSMETATARSAL AMPUTATION  Left foot amputation healing satisfactorily thus far We'll continue to follow with you   Josephina Gip, MD 01/10/2013 8:40 AM

## 2013-01-10 NOTE — Progress Notes (Signed)
[facility] PROGRESS NOTE  [name] ZOX:[id number] DOB: 01/05/1941 DOA: 01/01/2013 PCP: [name], MD  Assessment/Plan:  Left leg cellulitis/Necrosis of the 1st, 2nd, 5th digit and LLE with non-healing wounds: - Leg  with chronic wounds and black toes. - Recent SFA stent 12/10/12.   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 Edilia Bo.  Patient tolerated the procedure well. - required PCA 01/09/13, will convert to oral pain medications 01/10/13 - Blood Cultures negative  (Final) - patient refuses to take her plavix as she believes this is causing the rash-which likely is not the case. She is on an 81 mg asa.Patient was wanting to know if there is an alternate to Plavix-have asked [facility] to let us know if there is any?  Peripheral Vascular disease -s/p recent stenting of SFA by cards -patient believes she developed a rash-and does not want to take Plavix -spoke with [facility]-they will give [place] further recommendations  Pain -Started neurontin 300 mg  01/09/13.  Taper up to BID 2/13. -Dilaudid PCA pump ordered by Ortho 2/12 will d/c today and patient is anxious over monitoring and oxygen. -Control pain with Oxycontin 15 mg bid, Oxycodone 5 mg q 4 prn, .5 dilaudid prn for break thru.  Swollen painfull left knee - gout - resolved - finished prednisone taper.  Skin Rash in the setting of psoriasis - appears stable.  Still present, slightly improved.   Refer to her dermatologist at [facility] on D/C - ? Drug reaction vs psoriasis, d/c vancomycin BC negative no fevers, d/c avapro and HCTZ.  - Patient refuses to take plavix as she think is causing her rash-have asked cards to evaluate to see if there is any other alternate agents or we should just advise the patient to continue with plavix  Anemia - Transfuse one unit PRBCs 01/07/13. - Hgb Stable  - 81 mg asa - On Iron supplementation (with stool softeners) - Will monitor.  COPD - stable - On home medication regimen  (albuterol inh, fluticasone, Advair, theophylline)  DM 2 - moderate control. Hgb A1C 7.2 -  lantus 40 unit, SS-Moderate and meal coverage - Will d/c to SNF when appropriate on Insulin.  HTN - restart Avapro 01/10/2013 - HCTZ - Moderate control. SBP 130 - 150  Hx of Barretts Esophagus - PPI.  Anxiety - Low dose ativan PRN  Code Status: Full Family Communication: Disposition Plan: d/c to SNF when appropriate. (2/13 - 14)   Consultants:  Cardiology [facility], Dr. Allyson Sabal  Orthopedic Surgery, Dr. Rennis Chris  Vascular Surgery, Dr. Edilia Bo  HPI/Subjective: Complaining of pain in foot.  Otherwise no complaints.  Objective: Filed Vitals:   01/10/13 0408 01/10/13 0621 01/10/13 0800 01/10/13 0826  BP:  134/72    Pulse:  79    Temp:  99.8 F (37.7 C)    TempSrc:  Oral    Resp: 22 22 18    Height:      Weight:  75.8 kg (167 lb 1.7 oz)    SpO2: 95% 99% 95% 10%    Intake/Output Summary (Last 24 hours) at 01/10/13 1216 Last data filed at 01/10/13 0900  Gross per 24 hour  Intake    240 ml  Output      0 ml  Net    240 ml   Filed Weights   01/07/13 0443 01/08/13 0625 01/10/13 0621  Weight: 77.1 kg (169 lb 15.6 oz) 77.474 kg (170 lb 12.8 oz) 75.8 kg (167 lb 1.7 oz)    Exam:  General:  WD AA female, in bed, trembling slightly with pain and anxiety  Cardiovascular: S1, S2  no m/r/g, 0 LLE swelling   Respiratory: No accessory muscle use, CTA  Abdomen: soft, nt, nd, +BS, no masses  Skin:  Pervasive body rash faintly red macules, confluent over her lower back, also sporadically located on her face, arms and abdomen.  Rash appears slightly improved.  Legs are covered with scale,  Extremitites:  Extensive scaling of the skin.  Left knee edema resolved. Left Foot wrapped in gauze & ace  Data Reviewed: Basic Metabolic Panel:  Recent Labs Lab 01/08/13 0502 01/10/13 0919  NA 139 136  K 4.0 4.3  CL 103 100  CO2 27 26  GLUCOSE 107* 167*  BUN 17 16  CREATININE 0.81 0.78   CALCIUM 9.3 9.3   CBC:  Recent Labs Lab 01/05/13 0620 01/07/13 0605 01/08/13 0502 01/09/13 0640 01/10/13 0919  WBC 11.5* 12.1* 11.1* 11.7* 11.5*  HGB 7.8* 8.0* 9.0* 8.9* 9.0*  HCT 26.1* 26.6* 29.0* 29.3* 30.3*  MCV 86.7 88.1 87.1 87.7 88.1  PLT 311 270 276 256 235   CBG:  Recent Labs Lab 01/09/13 0920 01/09/13 1243 01/09/13 1740 01/09/13 2205 01/10/13 0749  GLUCAP 138* 152* 157* 188* 141*    Recent Results (from the past 240 hour(s))  CULTURE, BLOOD (ROUTINE X 2)     Status: None   Collection Time    01/01/13  5:45 PM      Result Value Range Status   Specimen Description BLOOD ARM LEFT   Final   Special Requests BOTTLES DRAWN AEROBIC AND ANAEROBIC 10CC   Final   Culture  Setup Time 01/01/2013 23:47   Final   Culture NO GROWTH 5 DAYS   Final   Report Status 01/07/2013 FINAL   Final  CULTURE, BLOOD (ROUTINE X 2)     Status: None   Collection Time    01/01/13  6:00 PM      Result Value Range Status   Specimen Description BLOOD HAND LEFT   Final   Special Requests BOTTLES DRAWN AEROBIC ONLY 10CC   Final   Culture  Setup Time 01/01/2013 23:47   Final   Culture NO GROWTH 5 DAYS   Final   Report Status 01/07/2013 FINAL   Final  SURGICAL PCR SCREEN     Status: None   Collection Time    01/07/13  2:30 PM      Result Value Range Status   MRSA, PCR NEGATIVE  NEGATIVE Final   Staphylococcus aureus NEGATIVE  NEGATIVE Final   Comment:            The Xpert SA Assay (FDA     approved for NASAL specimens     in patients over 54 years of age),     is one component of     a comprehensive surveillance     program.  Test performance has     been validated by [employer] for patients greater     than or equal to 54 year old.     It is not intended     to diagnose infection nor to     guide or monitor treatment.     Studies: No results found.  Scheduled Meds: . aspirin EC  81 mg Oral Daily  . docusate sodium  100 mg Oral BID  . enoxaparin (LOVENOX) injection   40 mg Subcutaneous Q24H  . gabapentin  300 mg  Oral BID  . hydrOXYzine  25 mg Oral TID  . insulin aspart  0-20 Units Subcutaneous TID WC  . insulin aspart  0-5 Units Subcutaneous QHS  . insulin aspart  4 Units Subcutaneous TID WC  . insulin glargine  40 Units Subcutaneous QHS  . LORazepam  0.5 mg Oral TID  . mometasone-formoterol  2 puff Inhalation BID  . OxyCODONE  15 mg Oral Q12H  . pantoprazole  20 mg Oral Q1200  . sodium chloride  3 mL Intravenous Q12H  . theophylline  200 mg Oral BID  . Travoprost (BAK Free)  1 drop Both Eyes QHS   Continuous Infusions:    Principal Problem:   Foot ulcer, left Active Problems:   DIABETES-TYPE 2   HYPERTENSION   COPD   Hypothyroidism (acquired)   Critical lower limb ischemia, with ulcer Lt foot- S/P atherectomy, PTA/stenting 12/10/12   PVD, Lt SFA stenting 12/10/12   Leukocytosis   Weakness   Skin rash   Gout attack    Time spent: 35 min   [place] [facility] Pager [phone]. If 8PM-8AM, contact night-coverage at [email or web address], password [facility] 01/10/2013, 12:16 PM  LOS: 9 days     Attending - Patient seen and examined, agree with the above assessment and plan. She is off the PCA, and wants to try oral narcotics. She believes that the Plavix and give her the rash in her torso, I have explained to her that it is probably not the Plavix, she wants to know if there is any other alternative agents, I have asked [facility] to take a look at her and let us know further recommendations. She likely will be discharged to a skilled nursing facility on 2/14.  S Ghimire

## 2013-01-10 NOTE — Progress Notes (Signed)
Spoke with Wilburt Finlay PA-C who d/w Dr Jolayne Haines to leave her off Plavix for increased Bleeding risk.

## 2013-01-11 DIAGNOSIS — E039 Hypothyroidism, unspecified: Secondary | ICD-10-CM

## 2013-01-11 LAB — GLUCOSE, CAPILLARY: Glucose-Capillary: 114 mg/dL — ABNORMAL HIGH (ref 70–99)

## 2013-01-11 MED ORDER — GABAPENTIN 300 MG PO CAPS: 300.0000 mg | ORAL_CAPSULE | Freq: Two times a day (BID) | ORAL | Status: DC

## 2013-01-11 MED ORDER — INSULIN GLARGINE 100 UNIT/ML ~~LOC~~ SOLN: 40.0000 [IU] | Freq: Every day | SUBCUTANEOUS | Status: DC

## 2013-01-11 MED ORDER — IRBESARTAN 75 MG PO TABS: 75.0000 mg | ORAL_TABLET | Freq: Every day | ORAL | Status: DC

## 2013-01-11 MED ORDER — INSULIN ASPART 100 UNIT/ML ~~LOC~~ SOLN: 0.0000 [IU] | Freq: Every day | SUBCUTANEOUS | Status: DC

## 2013-01-11 MED ORDER — LORAZEPAM 0.5 MG PO TABS: 0.2500 mg | ORAL_TABLET | Freq: Three times a day (TID) | ORAL | Status: DC

## 2013-01-11 MED ORDER — OXYCODONE HCL 5 MG PO TABS: 5.0000 mg | ORAL_TABLET | ORAL | Status: DC | PRN

## 2013-01-11 MED ORDER — DSS 100 MG PO CAPS: 100.0000 mg | ORAL_CAPSULE | Freq: Two times a day (BID) | ORAL | Status: DC

## 2013-01-11 MED ORDER — HYDROXYZINE HCL 25 MG PO TABS: 25.0000 mg | ORAL_TABLET | Freq: Three times a day (TID) | ORAL | Status: DC

## 2013-01-11 MED ORDER — INSULIN ASPART 100 UNIT/ML ~~LOC~~ SOLN: 4.0000 [IU] | Freq: Three times a day (TID) | SUBCUTANEOUS | Status: DC

## 2013-01-11 MED ORDER — OXYCODONE HCL ER 15 MG PO T12A: 15.0000 mg | EXTENDED_RELEASE_TABLET | Freq: Two times a day (BID) | ORAL | Status: DC

## 2013-01-11 MED ORDER — INSULIN ASPART 100 UNIT/ML ~~LOC~~ SOLN: 0.0000 [IU] | Freq: Three times a day (TID) | SUBCUTANEOUS | Status: DC

## 2013-01-11 NOTE — Plan of Care (Signed)
Problem: Phase III Progression Outcomes Goal: Activity at appropriate level-compared to baseline (UP IN CHAIR FOR HEMODIALYSIS)  Outcome: Progressing To [name] for rehab

## 2013-01-11 NOTE — Clinical Social Work Placement (Signed)
Clinical Social Work Department CLINICAL SOCIAL WORK PLACEMENT NOTE 01/11/2013  Patient:  [name], [name]  Account Number:  [id number] Admit date:  01/01/2013  Clinical Social Worker:  Johnsie Cancel  Date/time:  01/11/2013 02:25 PM  Clinical Social Work is seeking post-discharge placement for this patient at the following level of care:   SKILLED NURSING   (*CSW will update this form in Epic as items are completed)   01/11/2013  Patient/family provided with [facility] Department of Clinical Social Work's list of facilities offering this level of care within the geographic area requested by the patient (or if unable, by the patient's family).  01/11/2013  Patient/family informed of their freedom to choose among providers that offer the needed level of care, that participate in Medicare, Medicaid or managed care program needed by the patient, have an available bed and are willing to accept the patient.  01/11/2013  Patient/family informed of [facility]' ownership interest in [facility], as well as of the fact that they are under no obligation to receive care at this facility.  PASARR submitted to E[facility] on 01/11/2013 PASARR number received from [facility] on 01/11/2013  FL2 transmitted to all facilities in geographic area requested by pt/family on  01/11/2013 FL2 transmitted to all facilities within larger geographic area on   Patient informed that his/her managed care company has contracts with or will negotiate with  certain facilities, including the following:     Patient/family informed of bed offers received:  01/11/2013 Patient chooses bed at [facility] Physician recommends and patient chooses bed at    Patient to be transferred to [facility] on  01/11/2013 Patient to be transferred to facility by [facility]  The following physician request were entered in Epic:   Additional Comments:  Lia Foyer, LCSWA [facility] Clinical Social  Worker Contact #: [phone]

## 2013-01-11 NOTE — Progress Notes (Addendum)
[facility] [place]  Postoperative Visit - Amputation  Date of Surgery: 01/01/2013 - 01/08/2013 Procedure(s): TRANSMETATARSAL AMPUTATION Left Surgeon: Surgeon(s): Chuck Hint, MD POD: 3 Days Post-Op  Subjective [name] is a 72 y.o. female who is S/P Left Procedure(s): TRANSMETATARSAL AMPUTATION.  Pt.denies increased pain in the stump. The patient notes pain is well controlled. Pt. denies phantom pain.  Significant Diagnostic Studies: CBC Lab Results  Component Value Date   WBC 11.5* 01/10/2013   HGB 9.0* 01/10/2013   HCT 30.3* 01/10/2013   MCV 88.1 01/10/2013   PLT 235 01/10/2013    BMET    Component Value Date/Time   NA 136 01/10/2013 0919   K 4.3 01/10/2013 0919   CL 100 01/10/2013 0919   CO2 26 01/10/2013 0919   GLUCOSE 167* 01/10/2013 0919   BUN 16 01/10/2013 0919   CREATININE 0.78 01/10/2013 0919   CALCIUM 9.3 01/10/2013 0919   GFRNONAA 82* 01/10/2013 0919   GFRAA >90 01/10/2013 0919    COAG Lab Results  Component Value Date   INR 1.19 12/10/2012   INR 1.0 10/27/2011   No results found for this basename: PTT     Intake/Output Summary (Last 24 hours) at 01/11/13 1046 Last data filed at 01/10/13 1838  Gross per 24 hour  Intake    480 ml  Output      0 ml  Net    480 ml   No data found.    Physical Examination  BP Readings from Last 3 Encounters:  01/11/13 147/63  01/11/13 147/63  12/12/12 115/75   Temp Readings from Last 3 Encounters:  01/11/13 97.5 F (36.4 C) Oral  01/11/13 97.5 F (36.4 C) Oral  12/12/12 98 F (36.7 C) Oral   SpO2 Readings from Last 3 Encounters:  01/11/13 96%  01/11/13 96%  12/12/12 94%   Pulse Readings from Last 3 Encounters:  01/11/13 70  01/11/13 70  12/12/12 111    Pt is A&Ox3  WDWN female with no complaints  Left amputation wound is healing well.  There is good bone coverage in the stump Stump is warm and well perfused, without drainage; without erythema Dressing  changed.  Assessment/plan:  [name] is a 71 y.o. female who is s/p Left Procedure(s): TRANSMETATARSAL AMPUTATION  The patient's stump is viable.  Follow-up 4 weeks from surgery  Clinton Gallant [facility] 10:46 AM 01/11/[phone]

## 2013-01-11 NOTE — Clinical Social Work Psychosocial (Signed)
Clinical Social Work Department BRIEF PSYCHOSOCIAL ASSESSMENT 01/11/2013  Patient:  [name], [name]     Account Number:  [id number]     Admit date:  01/01/2013  Clinical Social Worker:  Johnsie Cancel  Date/Time:  01/11/2013 02:27 PM  Referred by:  Physician  Date Referred:  01/08/2013 Referred for  SNF Placement   Other Referral:   Interview type:  Patient Other interview type:    PSYCHOSOCIAL DATA Living Status:  ALONE  Primary support name:  Rosey Bath (sister) Primary support relationship to patient:  SIBLING Degree of support available:   Unknown. Patient reports adequate.    CURRENT CONCERNS Current Concerns  Post-Acute Placement   Other Concerns:    SOCIAL WORK ASSESSMENT / PLAN CSW consulted re: patient SNF needs. CSW met with patient at bedside to discuss SNF. CSW provided support and education on SNF process. Patient was agreeable to SNF and states she looks forward to getting back home. CSW faxed out to [facility]. CSW will provide bed offers and assist in discharge planning.   Assessment/plan status:  Information/Referral to [facility] Other assessment/ plan:   Information/referral to community resources:    PATIENT'S/FAMILY'S RESPONSE TO PLAN OF CARE: Patient thanked CSW for spending time explaining SNF process and assisting in d/c.    Lia Foyer, LCSWA [facility] Clinical Social Worker Contact #: [phone]

## 2013-01-11 NOTE — Clinical Social Work Note (Signed)
CSW was consulted to complete discharge of patient. Pt to transfer to [facility] today via [facility]. [facility] and patient are aware of d/c. D/C packet complete with chart copy, signed FL2, and signed hard Rx.  CSW signing off as no other CSW needs identified at this time.  Lia Foyer, LCSWA [facility] Clinical Social Worker Contact #: [phone]

## 2013-01-11 NOTE — Discharge Summary (Signed)
Physician Discharge Summary  JAMINA MACBETH ZOX:[id number] DOB: 1940/12/26 DOA: 01/01/2013  PCP: [name], MD  Admit date: 01/01/2013 Discharge date: 01/11/2013  Time spent:45 minutes  Recommendations for Outpatient Follow-up:  Follow up with Dr. Allyson Sabal 2/19 regarding SFA Stent.  Patient refusing Plavix due to rash.  Taking 81 mg ASA  Follow up with Dr. Edilia Bo in 1 month regarding amputation.   Physical therapy notes from Ortho:  Patient to receive PT at SNF.  Be Partially Weight Bearing and only with a DARCO shoe.  Be Patient, Do not be overly aggressive with PT.  Per Dr. Edilia Bo, pampering this foot will be the only chance it has to heal.   Dressing change to amputation daily.  PTx/ PWB left foot- heel only  CBC in 2 weeks to check hgb / anemia.  Discharge Diagnoses:  Principal Problem:   Foot ulcer, left Active Problems:   DIABETES-TYPE 2   HYPERTENSION   COPD   Hypothyroidism (acquired)   Critical lower limb ischemia, with ulcer Lt foot- S/P atherectomy, PTA/stenting 12/10/12   PVD, Lt SFA stenting 12/10/12   Leukocytosis   Weakness   Skin rash   Gout attack   Discharge Condition: stable post amputation, pain controlled.  Diet recommendation: carb modified  Filed Weights   01/08/13 0625 01/10/13 0621 01/11/13 0423  Weight: 77.474 kg (170 lb 12.8 oz) 75.8 kg (167 lb 1.7 oz) 75.8 kg (167 lb 1.7 oz)    History of present illness:  HPI: Giuseppina C Chhim is a 72 y.o. female with PMH significant for Diabetes, psoriasis, PVD sp stent to left SFA December 09 2012 who presents complaining of generalized weakness. She also relates chills, worsening left foot pain since 2 to 3 days prior to admission. She also has had a rash legs, arms and chest since 2 to 3 weeks prior to admission (approximately the same time that she started Plavix for her stent).   Workup in the emergency department revealed left lower extremity cellulitis and ischemia in the first second and fifth  digits of her left foot. She was started on vancomycin and admitted to a telemetry bed. Dr. Allyson Sabal was consulted.  Hospital Course:   Left leg cellulitis/Necrosis of the 1st, 2nd, 5th digit and LLE with non-healing wounds:  The patient's left lower extremity had chronic wounds 3 black toes. Dr. Allyson Sabal placed a stent to the SFA on 12/10/12 to re-vascularize the extremity. She had significant pain in her foot. After several days of conservative therapy with antibiotics and pain management she showed no improvement .  Dr. Edilia Bo of Vascular surgery was consulted and performed transmetatarsal amputation on 2.11.2014.  The patient tolerated the procedure well and began to improve. Blood Cultures were negative (Final) and antibiotics were discontinued.  Her pain is now controlled with long acting Oxycontin bid and gabapentin.  Dr. Edilia Bo has left specific instructions regarding PT and dressing changes at the skilled nursing facility.  He will see her in his office in 4 weeks.   Dr. Allyson Sabal was saw the patient during her stay.  He felt her chronic wounds were improved.  He knows about her refusal to take the Plavix.  He will see her in his office on 2/19.We spoke with [facility]-and asked them about if patient needs a alternate agent to plavix-current recommendations are just to stay of Plavix  Peripheral Vascular disease  Recent stenting of SFA by Dr. Allyson Sabal [facility].  Patient believes she developed a rash due to Plavix and  does not want to take Plavix. We spoke with [facility] are aware and will see her in follow up outpatient.  Pain  Patient with significant pain from her foot as well as from swelling in her knee initially.  After surgery she was started on neurontin 300 mg.  This has been titrated up to bid.  She is doing well on long acting oxycontin 15 mg bid.  With Oxycodone 5 mg prn for break thru pain.  Swollen painfull left knee - gout ? The patient's left knee was swollen, red and exquisitely tender.  Serum uric acid  was elevated.  Orthopedic Surgery consulted and felt that there was no effusion to tap, but they did give her a steroid injection in her knee.  THis provided significant relief.  THe patient was then placed on oral steroids which have been slowly tapered and discontinued.  She is no longer having left knee pain.  Skin Rash in the setting of psoriasis  Ms. Heide had an extensive skin rash covering her legs, arms, chest, back and parts of her face.  She felt it had flared after she began taking Plavix several weeks ago.  She refused to continue to take plavix.  She has a history of psoriasis.  This was felt to be a psoriatic flare.  She was treated emollient cream bid, hydroxyzine and steroid therapy.  The rash is still present but has improved.  She has a regular dermatologist at [facility] with whom she will need follow up soon after discharge.  Anemia  Hgb dropped after stent placement and the initiation of plavix.  Plavix has been discontinued by the patient.  She received a  Transfusion of one unit PRBCs 01/07/13 with no adverse side effects.  Her hgb has remained stable since.  She is on an 81 mg asa daily.  Iron supplementation (with stool softeners) has been initiated.  Monitor outpatient.  COPD  Stable.  On home medication regimen (albuterol inh, fluticasone, Advair, theophylline)   DM 2  Moderate control. Hgb A1C 7.2   Treated inpatient with lantus 40 unit, SS-Moderate and meal coverage  Will d/c to SNF on Insulin.   HTN  Moderate control. SBP 130 - 150 on olmesartan,   Hx of Barretts Esophagus  Controlled. PPI.   Anxiety  Low dose ativan TID working well.    Procedures:  Blood Transfusion  Steroid injection in the left knee  Left transmetatarsal amputation  Consultations:  Cardiology  Orthopedic Surgery  Vascular Surgery  Discharge Exam: Filed Vitals:   01/10/13 2009 01/10/13 2149 01/11/13 0423 01/11/13 0831  BP:  141/74 147/63   Pulse:  73 70   Temp:  98 F (36.7  C) 97.5 F (36.4 C)   TempSrc:  Oral Oral   Resp:  16 16   Height:      Weight:   75.8 kg (167 lb 1.7 oz)   SpO2: 98% 100% 98% 96%   General: WD AA female, in bed, smiling, in good humor Cardiovascular: S1, S2 no m/r/g, 0 LLE swelling  Respiratory: No accessory muscle use, CTA  Abdomen: soft, nt, nd, +BS, no masses Skin: Pervasive body rash drying up and becoming less erythematous, confluent over her lower back, also sporadically located on her face, arms and abdomen. Rash appears slightly improved. Legs are covered with scale,  Extremitites: Extensive scaling of the skin. Left knee edema resolved. Left Foot wrapped in gauze & ace   Discharge Instructions  Discharge Orders   Future Appointments  Provider Department Dept Phone   01/24/2013 10:00 AM Romero Belling, MD [facility] ENDOCRINOLOGY [phone]   Future Orders Complete By Expires     Diet - low sodium heart healthy  As directed     Increase activity slowly  As directed         Medication List    STOP taking these medications       BENICAR HCT 20-12.5 MG per tablet  Generic drug:  olmesartan-hydrochlorothiazide     clopidogrel 75 MG tablet  Commonly known as:  PLAVIX     collagenase ointment  Commonly known as:  SANTYL     glyBURIDE-metformin 2.5-500 MG per tablet  Commonly known as:  GLUCOVANCE     HYDROcodone-acetaminophen 5-325 MG per tablet  Commonly known as:  NORCO/VICODIN     olmesartan-hydrochlorothiazide 40-12.5 MG per tablet  Commonly known as:  BENICAR HCT      TAKE these medications       acetaminophen 325 MG tablet  Commonly known as:  TYLENOL  Take 2 tablets (650 mg total) by mouth every 4 (four) hours as needed.     albuterol 108 (90 BASE) MCG/ACT inhaler  Commonly known as:  PROVENTIL HFA;VENTOLIN HFA  Inhale 1 puff into the lungs every 6 (six) hours as needed. FOR SHORTNESS OF BREATH     ammonium lactate 12 % lotion  Commonly known as:  LAC-HYDRIN  Apply 1 application  topically daily as needed. For skin condition.     aspirin EC 81 MG tablet  Take 81 mg by mouth daily.     Clobetasol Propionate 0.05 % external spray  Commonly known as:  TEMOVATE  Apply 1 spray topically 2 (two) times daily as needed. For psoriasis flare-ups     DSS 100 MG Caps  Take 100 mg by mouth 2 (two) times daily.     esomeprazole 40 MG capsule  Commonly known as:  NEXIUM  Take 40 mg by mouth daily before breakfast.     Eye Vitamins Caps  Take 1 capsule by mouth daily.     fluticasone 50 MCG/ACT nasal spray  Commonly known as:  FLONASE  Place 2 sprays into the nose daily as needed. For allergies     Fluticasone-Salmeterol 250-50 MCG/DOSE Aepb  Commonly known as:  ADVAIR  Inhale 1 puff into the lungs every 12 (twelve) hours.     gabapentin 300 MG capsule  Commonly known as:  NEURONTIN  Take 1 capsule (300 mg total) by mouth 2 (two) times daily.     hydrOXYzine 25 MG tablet  Commonly known as:  ATARAX/VISTARIL  Take 1 tablet (25 mg total) by mouth 3 (three) times daily.     insulin aspart 100 UNIT/ML injection  Commonly known as:  novoLOG  Inject 0-20 Units into the skin 3 (three) times daily with meals.     insulin aspart 100 UNIT/ML injection  Commonly known as:  novoLOG  Inject 0-5 Units into the skin at bedtime.     insulin aspart 100 UNIT/ML injection  Commonly known as:  novoLOG  Inject 4 Units into the skin 3 (three) times daily with meals.     insulin glargine 100 UNIT/ML injection  Commonly known as:  LANTUS  Inject 40 Units into the skin at bedtime.     irbesartan 75 MG tablet  Commonly known as:  AVAPRO  Take 1 tablet (75 mg total) by mouth daily.     LORazepam 0.5 MG tablet  Commonly known  as:  ATIVAN  Take 0.5 tablets (0.25 mg total) by mouth every 8 (eight) hours.     MYLANTA GAS PO  Take 1 tablet by mouth daily as needed. For gas     oxyCODONE 5 MG immediate release tablet  Commonly known as:  Oxy IR/ROXICODONE  Take 1 tablet (5 mg  total) by mouth every 4 (four) hours as needed.     OxyCODONE 15 mg T12a  Commonly known as:  OXYCONTIN  Take 1 tablet (15 mg total) by mouth every 12 (twelve) hours.     predniSONE 5 MG tablet  Commonly known as:  DELTASONE  Take 5 mg by mouth daily. On Monday, Wednesday, Friday     ranitidine 150 MG capsule  Commonly known as:  ZANTAC  Take 150 mg by mouth at bedtime.     theophylline 200 MG 12 hr tablet  Commonly known as:  THEODUR  Take 200 mg by mouth 2 (two) times daily.     Travoprost (BAK Free) 0.004 % Soln ophthalmic solution  Commonly known as:  TRAVATAN  Place 1 drop into both eyes at bedtime.           Follow-up Information   Follow up with Runell Gess, MD On 01/16/2013. (11:15 am)    Contact information:   [address] [place] [address] [phone]       Follow up with DICKSON,CHRISTOPHER S, MD In 4 weeks. (Call the office to arrange to be seen in 4 weeks.)    Contact information:   [address] [place] [address] [phone]        The results of significant diagnostics from this hospitalization (including imaging, microbiology, ancillary and laboratory) are listed below for reference.    Significant Diagnostic Studies: Dg Chest 2 View  01/01/2013  *RADIOLOGY REPORT*  Clinical Data: COPD  CHEST - 2 VIEW  Comparison: Chest radiograph 12/03/2012  Findings: Normal mediastinum and heart silhouette.  Costophrenic angles are clear.  No effusion, infiltrate, or pneumothorax. Degenerative changes at the right shoulder are stable. Degenerative osteophytosis of the thoracic spine.  IMPRESSION: No acute cardiopulmonary process.   Original Report Authenticated By: Genevive Bi, M.D.    Dg Foot Complete Left  01/01/2013  *RADIOLOGY REPORT*  Clinical Data: Pressure ulcers left foot  LEFT FOOT - COMPLETE 3+ VIEW  Comparison: None.  Findings: There is  skin irregularity adjacent to the first metatarsal.  There is no subcutaneous gas.  There  is no evidence of cortical destruction to suggest active osteomyelitis.  Mild osteopenia.  IMPRESSION: 1.  No radiographic evidence of osteomyelitis. 2.  Soft tissue deformity  adjacent to  the first metatarsal.   Original Report Authenticated By: Genevive Bi, M.D.     Microbiology: Recent Results (from the past 240 hour(s))  CULTURE, BLOOD (ROUTINE X 2)     Status: None   Collection Time    01/01/13  5:45 PM      Result Value Range Status   Specimen Description BLOOD ARM LEFT   Final   Special Requests BOTTLES DRAWN AEROBIC AND ANAEROBIC 10CC   Final   Culture  Setup Time 01/01/2013 23:47   Final   Culture NO GROWTH 5 DAYS   Final   Report Status 01/07/2013 FINAL   Final  CULTURE, BLOOD (ROUTINE X 2)     Status: None   Collection Time    01/01/13  6:00 PM      Result Value Range Status  Specimen Description BLOOD HAND LEFT   Final   Special Requests BOTTLES DRAWN AEROBIC ONLY 10CC   Final   Culture  Setup Time 01/01/2013 23:47   Final   Culture NO GROWTH 5 DAYS   Final   Report Status 01/07/2013 FINAL   Final  SURGICAL PCR SCREEN     Status: None   Collection Time    01/07/13  2:30 PM      Result Value Range Status   MRSA, PCR NEGATIVE  NEGATIVE Final   Staphylococcus aureus NEGATIVE  NEGATIVE Final   Comment:            The Xpert SA Assay (FDA     approved for NASAL specimens     in patients over 42 years of age),     is one component of     a comprehensive surveillance     program.  Test performance has     been validated by [employer] for patients greater     than or equal to 57 year old.     It is not intended     to diagnose infection nor to     guide or monitor treatment.     Labs: Basic Metabolic Panel:  Recent Labs Lab 01/08/13 0502 01/10/13 0919  NA 139 136  K 4.0 4.3  CL 103 100  CO2 27 26  GLUCOSE 107* 167*  BUN 17 16  CREATININE 0.81 0.78  CALCIUM 9.3 9.3   CBC:  Recent Labs Lab 01/05/13 0620 01/07/13 0605 01/08/13 0502  01/09/13 0640 01/10/13 0919  WBC 11.5* 12.1* 11.1* 11.7* 11.5*  HGB 7.8* 8.0* 9.0* 8.9* 9.0*  HCT 26.1* 26.6* 29.0* 29.3* 30.3*  MCV 86.7 88.1 87.1 87.7 88.1  PLT 311 270 276 256 235   CBG:  Recent Labs Lab 01/10/13 0749 01/10/13 1226 01/10/13 1723 01/10/13 2157 01/11/13 0730  GLUCAP 141* 159* 236* 152* 114*       Signed:  Conley Canal [phone] [facility] 01/11/2013, 11:06 AM  Attending -Patient seen and examined, agree with the assessment and plan. Much better today, pain well controlled with oral narcotics-stable to be discharged to SNF.   S Reyes Aldaco

## 2013-01-11 NOTE — Progress Notes (Signed)
[name] discharged Skilled nursing facility per MD order.  Report called to receiving Lemar Livings, RN  at [employer].     Medication List    STOP taking these medications       BENICAR HCT 20-12.5 MG per tablet  Generic drug:  olmesartan-hydrochlorothiazide     clopidogrel 75 MG tablet  Commonly known as:  PLAVIX     collagenase ointment  Commonly known as:  SANTYL     glyBURIDE-metformin 2.5-500 MG per tablet  Commonly known as:  GLUCOVANCE     HYDROcodone-acetaminophen 5-325 MG per tablet  Commonly known as:  NORCO/VICODIN     olmesartan-hydrochlorothiazide 40-12.5 MG per tablet  Commonly known as:  BENICAR HCT      TAKE these medications       acetaminophen 325 MG tablet  Commonly known as:  TYLENOL  Take 2 tablets (650 mg total) by mouth every 4 (four) hours as needed.     albuterol 108 (90 BASE) MCG/ACT inhaler  Commonly known as:  PROVENTIL HFA;VENTOLIN HFA  Inhale 1 puff into the lungs every 6 (six) hours as needed. FOR SHORTNESS OF BREATH     ammonium lactate 12 % lotion  Commonly known as:  LAC-HYDRIN  Apply 1 application topically daily as needed. For skin condition.     aspirin EC 81 MG tablet  Take 81 mg by mouth daily.     Clobetasol Propionate 0.05 % external spray  Commonly known as:  TEMOVATE  Apply 1 spray topically 2 (two) times daily as needed. For psoriasis flare-ups     DSS 100 MG Caps  Take 100 mg by mouth 2 (two) times daily.     esomeprazole 40 MG capsule  Commonly known as:  NEXIUM  Take 40 mg by mouth daily before breakfast.     Eye Vitamins Caps  Take 1 capsule by mouth daily.     fluticasone 50 MCG/ACT nasal spray  Commonly known as:  FLONASE  Place 2 sprays into the nose daily as needed. For allergies     Fluticasone-Salmeterol 250-50 MCG/DOSE Aepb  Commonly known as:  ADVAIR  Inhale 1 puff into the lungs every 12 (twelve) hours.     gabapentin 300 MG capsule  Commonly known as:  NEURONTIN  Take 1 capsule  (300 mg total) by mouth 2 (two) times daily.     hydrOXYzine 25 MG tablet  Commonly known as:  ATARAX/VISTARIL  Take 1 tablet (25 mg total) by mouth 3 (three) times daily.     insulin aspart 100 UNIT/ML injection  Commonly known as:  novoLOG  Inject 0-20 Units into the skin 3 (three) times daily with meals.     insulin aspart 100 UNIT/ML injection  Commonly known as:  novoLOG  Inject 0-5 Units into the skin at bedtime.     insulin aspart 100 UNIT/ML injection  Commonly known as:  novoLOG  Inject 4 Units into the skin 3 (three) times daily with meals.     insulin glargine 100 UNIT/ML injection  Commonly known as:  LANTUS  Inject 40 Units into the skin at bedtime.     irbesartan 75 MG tablet  Commonly known as:  AVAPRO  Take 1 tablet (75 mg total) by mouth daily.     LORazepam 0.5 MG tablet  Commonly known as:  ATIVAN  Take 0.5 tablets (0.25 mg total) by mouth every 8 (eight) hours.     MYLANTA GAS PO  Take 1 tablet by mouth  daily as needed. For gas     oxyCODONE 5 MG immediate release tablet  Commonly known as:  Oxy IR/ROXICODONE  Take 1 tablet (5 mg total) by mouth every 4 (four) hours as needed.     OxyCODONE 15 mg T12a  Commonly known as:  OXYCONTIN  Take 1 tablet (15 mg total) by mouth every 12 (twelve) hours.     predniSONE 5 MG tablet  Commonly known as:  DELTASONE  Take 5 mg by mouth daily. On Monday, Wednesday, Friday     ranitidine 150 MG capsule  Commonly known as:  ZANTAC  Take 150 mg by mouth at bedtime.     theophylline 200 MG 12 hr tablet  Commonly known as:  THEODUR  Take 200 mg by mouth 2 (two) times daily.     Travoprost (BAK Free) 0.004 % Soln ophthalmic solution  Commonly known as:  TRAVATAN  Place 1 drop into both eyes at bedtime.        Patients skin is clean, dry and intact, no evidence of skin break down.  Had surgery dressing to left foot CDI. IV site discontinued and catheter remains intact. Site without signs and symptoms of  complications. Dressing and pressure applied.  Patient transported on a stretcher by non emergent EMS,  no distress noted upon discharge.  Laural Benes, Glennette Galster C 01/11/2013 5:07 PM

## 2013-01-11 NOTE — Progress Notes (Signed)
Physical Therapy Treatment Patient Details Name: EBELIN DILLEHAY MRN: [id number] DOB: 11/28/1941 Today's Date: 01/11/2013 Time: 5784-6962 PT Time Calculation (min): 16 min  PT Assessment / Plan / Recommendation Comments on Treatment Session  Pt s/p transmet amp.  Pt with improved mobility as pain in better control.    Follow Up Recommendations  SNF     Does the patient have the potential to tolerate intense rehabilitation     Barriers to Discharge        Equipment Recommendations  None recommended by PT    Recommendations for Other Services    Frequency Min 3X/week   Plan Discharge plan remains appropriate;Frequency remains appropriate    Precautions / Restrictions Precautions Precautions: Fall Required Braces or Orthoses: Other Brace/Splint Other Brace/Splint: Darco shoe lt foot Restrictions LLE Weight Bearing: Partial weight bearing   Pertinent Vitals/Pain Lt foot pain especially in sitting but improved with leg elevated.    Mobility  Bed Mobility Supine to Sit: 4: Min guard;HOB elevated Sitting - Scoot to Edge of Bed: 4: Min guard Transfers Transfers: Scientist, clinical (histocompatibility and immunogenetics) Transfers Sit to Stand: 3: Mod assist;With upper extremity assist;With armrests;From chair/3-in-1 Stand to Sit: 4: Min assist;With upper extremity assist;With armrests;To chair/3-in-1 Stand Pivot Transfers: 3: Mod assist Details for Transfer Assistance: Pt able to put partial weight on lt foot with darco shoe on. Ambulation/Gait Ambulation/Gait Assistance: 4: Min assist Ambulation Distance (Feet): 5 Feet Assistive device: Rolling walker Gait Pattern: Step-to pattern;Decreased stance time - left;Decreased step length - right    Exercises     PT Diagnosis:    PT Problem List:   PT Treatment Interventions:     PT Goals Acute Rehab PT Goals PT Goal: Supine/Side to Sit - Progress: Progressing toward goal PT Goal: Sit to Stand - Progress: Progressing toward goal PT Goal: Stand to Sit - Progress:  Progressing toward goal PT Transfer Goal: Bed to Chair/Chair to Bed - Progress: Progressing toward goal PT Goal: Ambulate - Progress: Progressing toward goal  Visit Information  Last PT Received On: 01/11/13 Assistance Needed: +1    Subjective Data  Subjective: Pt states she is feeling better.   Cognition  Cognition Overall Cognitive Status: Appears within functional limits for tasks assessed/performed Arousal/Alertness: Awake/alert Orientation Level: Oriented X4 / Intact Behavior During Session: [facility] for tasks performed    Balance  Static Standing Balance Static Standing - Balance Support: Bilateral upper extremity supported Static Standing - Level of Assistance: 4: Min assist  End of Session PT - End of Session Equipment Utilized During Treatment: Gait belt Activity Tolerance: Patient tolerated treatment well Patient left: in chair;with call bell/phone within reach Nurse Communication: Mobility status   GP     [facility] 01/11/2013, 9:48 AM  [facility] PT [phone]

## 2013-01-14 ENCOUNTER — Telehealth: Payer: Self-pay | Admitting: Vascular Surgery

## 2013-01-14 NOTE — Telephone Encounter (Addendum)
Message copied by Fredrich Birks on Mon Jan 14, 2013  1:51 PM ------      Message from: [place], [place] K      Created: Mon Jan 14, 2013 11:48 AM      Regarding: schedule       This may already be scheduled.                  ----- Message -----         From: Sharee Pimple, CMA         Sent: 01/08/2013   2:01 PM           To: Dara Lords, PA            :)            ----- Message -----         From: Dara Lords, PA         Sent: 01/08/2013   1:32 PM           To: Sharee Pimple, CMA            S/p left TMA by CSD.  F/u with him in 4 weeks.            Thanks,      Samantha             ------  01/14/13- lm for patient regarding f/u and sent letter, dpm

## 2013-01-24 ENCOUNTER — Ambulatory Visit: Payer: Medicare Other | Admitting: Endocrinology

## 2013-02-12 ENCOUNTER — Encounter: Payer: Self-pay | Admitting: Vascular Surgery

## 2013-02-13 ENCOUNTER — Ambulatory Visit (INDEPENDENT_AMBULATORY_CARE_PROVIDER_SITE_OTHER): Payer: Medicare Other | Admitting: Vascular Surgery

## 2013-02-13 ENCOUNTER — Encounter: Payer: Self-pay | Admitting: Vascular Surgery

## 2013-02-13 VITALS — BP 153/62 | HR 91 | Ht 64.0 in | Wt 164.5 lb

## 2013-02-13 DIAGNOSIS — I70269 Atherosclerosis of native arteries of extremities with gangrene, unspecified extremity: Secondary | ICD-10-CM | POA: Insufficient documentation

## 2013-02-13 NOTE — Progress Notes (Signed)
[facility]ist [facility]  Patient name: [name] MRN: [id number] DOB: [birth year] Sex: female  REASON FOR VISIT: all up after left transmetatarsal amputation.  HPI: [name] is a 72 y.o. female who underwent endovascular revascularization by Dr. Allyson Sabal. 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 I performed a transmetatarsal amputation on 01/08/2013. She did well postoperatively returns for her first postoperative visit. She has no specific complaints. The wound is healed nicely.   REVIEW OF SYSTEMS: Arly.Keller ] denotes positive finding; [  ] denotes negative finding  CARDIOVASCULAR:  [ ]  chest pain   [ ]  dyspnea on exertion    CONSTITUTIONAL:  [ ]  fever   [ ]  chills  PHYSICAL EXAM: Filed Vitals:   02/13/13 1440  BP: 153/62  Pulse: 91  Height: 5\' 4"  (1.626 m)  Weight: 164 lb 8 oz (74.617 kg)  SpO2: 100%   Body mass index is 28.22 kg/(m^2). GENERAL: The patient is a well-nourished female, in no acute distress. The vital signs are documented above. CARDIOVASCULAR: There is a regular rate and rhythm  PULMONARY: There is good air exchange bilaterally without wheezing or rales. Her transmetatarsal amputation site is healed. The foot is warm and well perfused. There is no erythema or drainage.  MEDICAL ISSUES: Overall pleased with her progress. Her transmetatarsal amputation site is healing nicely. I have written her a prescription for an insert for her shoe for the TMA. I'll see her back as needed.  Mavery Milling S [facility] [place]: [phone]

## 2013-03-26 ENCOUNTER — Encounter: Payer: Self-pay | Admitting: Cardiovascular Disease

## 2013-04-29 ENCOUNTER — Telehealth: Payer: Self-pay | Admitting: *Deleted

## 2013-04-29 NOTE — Telephone Encounter (Signed)
Mrs. Pua called to report soreness at her Left foot amputation site; [employer]) is working on revising her shoe inserts. She states that she has had no skin breakdown or discoloration, other than her usual skin color. No erythema, fever or drainage from amputation site. She has an appt on Wednesday for followup with [employer]. I asked her to call us back if anything changes in her skin or pain. I called [employer] and Judeth Cornfield will make Reita Cliche aware of this reported slight soreness in her foot. Mrs. Wisniewski voiced understanding of this plan and will call us back if any change.

## 2013-05-15 ENCOUNTER — Telehealth: Payer: Self-pay

## 2013-05-15 NOTE — Telephone Encounter (Signed)
Pt. Called to say she was waiting on a phone call from office, after someone she spoke to approx. 2 weeks ago, was to speak to Dr. Edilia Bo regarding follow-up appt.  Questioned pt. about present problem.  States she has a soreness of the amputation site-left foot.  States "it sometimes feels like I am walking on a brick".  States there is "a rough area", that she showed Dr. Edilia Bo at the last appt.; she is applying glycerine cream, as directed.  Denies any open area or drainage from surgical site.  Denies fever.  Questions if her discomfort is normal, at this point, and wanted to have Dr. Edilia Bo check this.  Advised pt. Will schedule her for a follow-up appt.; stated Dr. Adele Dan 1st available is in 2 weeks.  Encouraged to call office if symptoms worsen prior to appt. At 9:00 AM 05/29/13.  Verb. Understanding.

## 2013-05-21 ENCOUNTER — Other Ambulatory Visit (HOSPITAL_COMMUNITY): Payer: Self-pay | Admitting: Pulmonary Disease

## 2013-05-21 ENCOUNTER — Ambulatory Visit (HOSPITAL_COMMUNITY)
Admission: RE | Admit: 2013-05-21 | Discharge: 2013-05-21 | Disposition: A | Discharge status: Home / Self Care | Payer: Medicare Other | Source: Ambulatory Visit | Attending: Pulmonary Disease | Admitting: Pulmonary Disease

## 2013-05-21 DIAGNOSIS — R0981 Nasal congestion: Secondary | ICD-10-CM

## 2013-05-21 DIAGNOSIS — J3489 Other specified disorders of nose and nasal sinuses: Secondary | ICD-10-CM | POA: Insufficient documentation

## 2013-05-21 DIAGNOSIS — R51 Headache: Secondary | ICD-10-CM | POA: Insufficient documentation

## 2013-05-23 ENCOUNTER — Other Ambulatory Visit (HOSPITAL_COMMUNITY): Payer: Self-pay | Admitting: Cardiovascular Disease

## 2013-05-23 DIAGNOSIS — I739 Peripheral vascular disease, unspecified: Secondary | ICD-10-CM

## 2013-05-29 ENCOUNTER — Ambulatory Visit: Payer: Medicare Other | Admitting: Vascular Surgery

## 2013-06-04 ENCOUNTER — Ambulatory Visit (HOSPITAL_COMMUNITY)
Admission: RE | Admit: 2013-06-04 | Discharge: 2013-06-04 | Disposition: A | Discharge status: Home / Self Care | Payer: Medicare Other | Source: Ambulatory Visit | Attending: Cardiovascular Disease | Admitting: Cardiovascular Disease

## 2013-06-04 DIAGNOSIS — I739 Peripheral vascular disease, unspecified: Secondary | ICD-10-CM

## 2013-06-04 NOTE — Progress Notes (Signed)
Lower Extremity Arterial Duplex Completed. °[name] ° °

## 2013-06-12 ENCOUNTER — Ambulatory Visit (HOSPITAL_COMMUNITY)
Admission: RE | Admit: 2013-06-12 | Discharge: 2013-06-12 | Disposition: A | Discharge status: Home / Self Care | Payer: Medicare Other | Source: Ambulatory Visit | Attending: Cardiovascular Disease | Admitting: Cardiovascular Disease

## 2013-06-12 DIAGNOSIS — I6529 Occlusion and stenosis of unspecified carotid artery: Secondary | ICD-10-CM | POA: Insufficient documentation

## 2013-06-12 DIAGNOSIS — I739 Peripheral vascular disease, unspecified: Secondary | ICD-10-CM | POA: Insufficient documentation

## 2013-06-12 NOTE — Progress Notes (Signed)
Carotid Duplex Completed. °[name] ° °

## 2013-06-17 ENCOUNTER — Encounter: Payer: Self-pay | Admitting: *Deleted

## 2013-06-17 ENCOUNTER — Telehealth: Payer: Self-pay | Admitting: *Deleted

## 2013-06-17 DIAGNOSIS — I6529 Occlusion and stenosis of unspecified carotid artery: Secondary | ICD-10-CM

## 2013-06-17 NOTE — Telephone Encounter (Signed)
Message copied by Marella Bile on Mon Jun 17, 2013  1:01 PM ------      Message from: Runell Gess      Created: Thu Jun 13, 2013  6:10 PM       No change from prior study. Repeat in 6 months ------

## 2013-07-30 ENCOUNTER — Other Ambulatory Visit (INDEPENDENT_AMBULATORY_CARE_PROVIDER_SITE_OTHER): Payer: Medicare Other

## 2013-07-30 ENCOUNTER — Ambulatory Visit (INDEPENDENT_AMBULATORY_CARE_PROVIDER_SITE_OTHER): Payer: Medicare Other | Admitting: Gastroenterology

## 2013-07-30 ENCOUNTER — Encounter: Payer: Self-pay | Admitting: Gastroenterology

## 2013-07-30 VITALS — BP 120/62 | HR 72 | Ht 64.0 in | Wt 177.0 lb

## 2013-07-30 DIAGNOSIS — K761 Chronic passive congestion of liver: Secondary | ICD-10-CM

## 2013-07-30 DIAGNOSIS — K7460 Unspecified cirrhosis of liver: Secondary | ICD-10-CM

## 2013-07-30 DIAGNOSIS — R109 Unspecified abdominal pain: Secondary | ICD-10-CM

## 2013-07-30 DIAGNOSIS — K219 Gastro-esophageal reflux disease without esophagitis: Secondary | ICD-10-CM

## 2013-07-30 LAB — HEPATIC FUNCTION PANEL
AST: 19 U/L (ref 0–37)
Albumin: 3.5 g/dL (ref 3.5–5.2)
Total Protein: 7 g/dL (ref 6.0–8.3)

## 2013-07-30 NOTE — Progress Notes (Signed)
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  She's had chronic, chronic right upper quadrant pain of unexplained etiology with negative radiographic imaging over many years.  CT scan suggested perhaps some sclerosis of the right hepatic lobe, but there is been no evidence of portal hypertension, focal hepatic masses, or definite pancreatic abnormalities.  She's also had ultrasound and [facility] HIDA scans.  She is chronically on Nexium 40 mg a day and Zantac on and 50 mg at bedtime.  She denies dyspepsia true reflux symptoms, but has nonspecific right upper quadrant discomfort without real precipitating or alleviating elements.  His not change in the last 10 years.  She denies lower GI were specific hepatobiliary complaints, anorexia, weight loss, melena hematochezia.  He is up-to-date on her endoscopy and colonoscopy, and has not felt to be a good candidate for followup.  These procedures because of her peripheral vascular disease and cardiac problems.  She is followed by Dr. Jeri Cos.  She is status post total abdominal hysterectomy and oophorectomy.  She has voluntarily had some weight loss over the last 2 years.  She gives no symptoms of chronic malabsorption.  Current Medications, Allergies, Past Medical History, Past Surgical History, Family History and Social History were reviewed in [employer] record.  ROS: All systems were reviewed and are negative unless otherwise stated in the HPI.          Physical Exam: Is not ill in any acute distress blood pressure 120/62, pulse 72, weight 177 with BMI of 30.37.  Her abdomen is somewhat obese and is difficult exam but I cannot appreciate any definite organomegaly or masses.  There is some tenderness to deep palpation the right upper  quadrant area without any definite abnormalities otherwise.  There is a well-healed midline lower abdominal scar with some kelosis .  Bowel sounds are normal I cannot appreciate an abdominal bruit.  Mental status is normal    Assessment and Plan: Chronic, chronic right upper quadrant discomfort of unexplained etiology a very sick patient with multiple multiple medical problems.  I do not think her pain is of significance that involves aggressive diagnostic procedures.  I will repeat her upper abdominal ultrasound exam and check liver profile and alpha-fetoprotein.  She is to continue all of her other meds as listed and reviewed.  Her symptoms certainly do not seem consistent with ischemic bowel problems.  Previous hepatic workup for any cause of cirrhosis was unremarkable.  Evaluation for chronic pancreatitis also has been negative.  She denies recent alcohol use but, apparently was a fairly steady use of alcohol in her younger days.  I suspect a lot of her pain is due to hepatic congestion associated with her pulmonary hypertension causing in so-called" cardiac cirrhosis".  She in the past because of her psoriasis was on Humira at one point.  She's had recent peripheral vascular stenting performed.  Overall, she appears to be doing fairly well for her degree of disease burden.  Please copy her primary care physician, referring physician, and pertinent subspecialists.

## 2013-07-30 NOTE — Patient Instructions (Addendum)
You have been scheduled for an abdominal ultrasound at [facility] (1st floor of hospital) on 08-02-2013 at 830 am. Please arrive 15 minutes prior to your appointment for registration. Make certain not to have anything to eat or drink 6 hours prior to your appointment. Should you need to reschedule your appointment, please contact radiology at [phone]. This test typically takes about 30 minutes to perform.  Your physician has requested that you go to the basement for the following lab work before leaving today: Liver Function Electronics engineer Protein

## 2013-07-31 LAB — AFP TUMOR MARKER: AFP-Tumor Marker: 2.5 ng/mL (ref 0.0–8.0)

## 2013-08-02 ENCOUNTER — Ambulatory Visit (HOSPITAL_COMMUNITY)
Admission: RE | Admit: 2013-08-02 | Discharge: 2013-08-02 | Disposition: A | Discharge status: Home / Self Care | Payer: Medicare Other | Source: Ambulatory Visit | Attending: Gastroenterology | Admitting: Gastroenterology

## 2013-08-02 DIAGNOSIS — R109 Unspecified abdominal pain: Secondary | ICD-10-CM | POA: Insufficient documentation

## 2013-08-09 ENCOUNTER — Encounter: Payer: Self-pay | Admitting: *Deleted

## 2013-08-12 ENCOUNTER — Encounter: Payer: Self-pay | Admitting: Physician Assistant

## 2013-08-12 ENCOUNTER — Ambulatory Visit (INDEPENDENT_AMBULATORY_CARE_PROVIDER_SITE_OTHER): Payer: Medicare Other | Admitting: Physician Assistant

## 2013-08-12 VITALS — BP 140/68 | HR 76 | Ht 64.0 in | Wt 177.0 lb

## 2013-08-12 DIAGNOSIS — I10 Essential (primary) hypertension: Secondary | ICD-10-CM

## 2013-08-12 DIAGNOSIS — I739 Peripheral vascular disease, unspecified: Secondary | ICD-10-CM

## 2013-08-12 NOTE — Assessment & Plan Note (Signed)
Pressures mildly elevated.  No changes to current medical therapy at this time.

## 2013-08-12 NOTE — Patient Instructions (Signed)
Follow up with Dr. Berry in 6 months.  

## 2013-08-12 NOTE — Assessment & Plan Note (Signed)
Patient has had improvement in lower extremity arterial Dopplers since her last study in January 2014. She reports no symptoms at this time.   We'll continue to monitor with Dopplers in January.

## 2013-08-12 NOTE — Progress Notes (Signed)
Date:  08/12/2013   ID:  [name], [name] Feb 19, 1941, MRN [id number]  PCP:  [name], MD  Primary Cardiologist:   Allyson Sabal    History of Present Illness: [name] is a 72 y.o. female  mildly overweight, widowed [place] American female with no children referred to Dr. Allyson Sabal by Dr. Fanny Dance for peripheral vascular evaluation because of a painful left foot with nonhealing ulcers. She is a retired Engineer, civil (consulting) from [employer]. Her risk factors include 25-pack-year history of tobacco abuse, smoking 1/2 pack per day, diabetes, hypertension and hyperlipidemia. We obtained a Myoview stress test which was normal and carotid Dopplers that showed moderate left ICA stenosis. Dr. Allyson Sabal angiogram'd her initially from her left leg because she had a totally occluded right external iliac artery demonstrating short segment calcified occlusion of her mid left SFA with 1-vessel runoff. Ultimately then in a staged fashion, he brought her back, hydrated her and accessed her left SFA using antegrade approach and performed Diamondback orbital rotational atherectomy, PTA and stenting using the IDEV self-explained stent with an excellent result. Her left ABI improved from 0.35 to 0.75. She ultimately underwent a transmetatarsal resection by Dr. Cari Caraway which has healed well.   The patient presents today for six month evaluation.  She reports getting easily fatigued and having no energy. She had recent carotid Dopplers and lower extremity arterial Dopplers. The carotids show no change and the lower extremity arterial Doppler showed improvement in the right from 0.59-0.80 and the left went from 0.75 in January to 0.77.  She currently denies nausea, vomiting, fever, chest pain, shortness of breath, orthopnea, dizziness, PND, cough, congestion, abdominal pain, hematochezia, melena, lower extremity edema.   Wt Readings from Last 3 Encounters:  08/12/13 177 lb (80.287 kg)  07/30/13 177 lb (80.287 kg)  02/13/13  164 lb 8 oz (74.617 kg)     Past Medical History  Diagnosis Date  . COPD (chronic obstructive pulmonary disease)   . Hypertension   . Asthma   . Psoriatic arthritis   . Psoriasis   . GERD (gastroesophageal reflux disease)   . Hiatal hernia   . Chronic sinus infection   . Heart murmur   . Blood transfusion ~ 1962    "11 units of blood"  . Ankylosing spondylitis   . Chronic back pain   . Dysrhythmia     "palpitations"  . Esophageal dilatation   . Candida esophagitis   . Cirrhosis of liver without mention of alcohol   . Hypercholesteremia   . Peripheral vascular disease   . Exertional dyspnea   . Pneumonia 10/07/11; 2006; 1980's  . Chronic bronchitis     "since childhood" (12/06/2012)  . Type II diabetes mellitus   . Iron deficiency anemia   . External bleeding hemorrhoids   . Sinus headache   . Arthritis     "psorasic"  . OA (osteoarthritis)   . DJD (degenerative joint disease) of knee     "both" (12/06/2012)  . Carpal tunnel syndrome on right   . Anxiety   . Barrett esophagus   . Non-healing ulcer of foot, secondary to diabetes and PVD 12/07/2012  . PVD (peripheral vascular disease) 12/07/2012  . Tobacco abuse 12/07/2012  . Critical lower limb ischemia, with ulcer Lt foot 12/07/2012  . Gout   . Psoriasis (a type of skin inflammation)   . COPD (chronic obstructive pulmonary disease)   . Carotid artery disease     dopplers showed mod L ICA  stenosis   . PAD (peripheral artery disease)   . History of nuclear stress test 11/2012    lexiscan; normal, low risk     Current Outpatient Prescriptions  Medication Sig Dispense Refill  . acetaminophen (TYLENOL) 325 MG tablet Take 2 tablets (650 mg total) by mouth every 4 (four) hours as needed.      Marland Kitchen albuterol (PROVENTIL HFA;VENTOLIN HFA) 108 (90 BASE) MCG/ACT inhaler Inhale 1 puff into the lungs every 6 (six) hours as needed. FOR SHORTNESS OF BREATH      . ALPRAZolam (XANAX) 0.5 MG tablet Take 0.5 mg by mouth at bedtime as needed  for sleep.      Marland Kitchen ammonium lactate (LAC-HYDRIN) 12 % lotion Apply 1 application topically daily as needed. For skin condition.      Marland Kitchen aspirin EC 81 MG tablet Take 81 mg by mouth daily.      . Clobetasol Propionate (TEMOVATE) 0.05 % external spray Apply 1 spray topically 2 (two) times daily as needed. For psoriasis flare-ups      . esomeprazole (NEXIUM) 40 MG capsule Take 40 mg by mouth daily before breakfast.      . fluticasone (FLONASE) 50 MCG/ACT nasal spray Place 2 sprays into the nose daily as needed. For allergies      . Fluticasone-Salmeterol (ADVAIR) 250-50 MCG/DOSE AEPB Inhale 1 puff into the lungs every 12 (twelve) hours.        Marland Kitchen GLIPIZIDE XL 2.5 MG 24 hr tablet       . metFORMIN (GLUCOPHAGE) 500 MG tablet Take 500 mg by mouth 2 (two) times daily with a meal.      . Multiple Vitamins-Minerals (EYE VITAMINS) CAPS Take 1 capsule by mouth daily.      . Olmesartan Medoxomil (BENICAR PO) Take by mouth.      . predniSONE (DELTASONE) 5 MG tablet Take 5 mg by mouth daily. On Monday, Wednesday, Friday       . ranitidine (ZANTAC) 150 MG capsule Take 150 mg by mouth at bedtime.        . Simethicone (MYLANTA GAS PO) Take 1 tablet by mouth daily as needed. For gas      . theophylline (THEODUR) 200 MG 12 hr tablet Take 300 mg by mouth 2 (two) times daily.       . Travoprost, BAK Free, (TRAVATAMN) 0.004 % SOLN ophthalmic solution Place 1 drop into both eyes at bedtime.       Marland Kitchen LORazepam (ATIVAN) 0.5 MG tablet Take 0.5 tablets (0.25 mg total) by mouth every 8 (eight) hours.  30 tablet  0   No current facility-administered medications for this visit.    Allergies:    Allergies  Allergen Reactions  . Amlodipine Besy-Benazepril Hcl Other (See Comments)    Nervous/shakiness  . Statins Other (See Comments)    "can't take any of them; cramp me up" (12/06/2012)  . Plavix [Clopidogrel] Rash    Pt states that the doctor told her that her rash was psoriasis and not related to plavix but she states the  rash came from the plavix  . Raptiva [Efalizumab] Rash    Social History:  The patient  reports that she has been smoking Cigarettes.  She has a 40 pack-year smoking history. She has never used smokeless tobacco. She reports that she does not drink alcohol or use illicit drugs.   Family history:   Family History  Problem Relation Age of Onset  . Lupus Sister     x2  .  Lung cancer Sister   . Thyroid disease Sister   . Colon cancer Neg Hx   . Cancer Mother   . Heart Problems Maternal Grandmother   . Cancer Paternal Grandfather   . Diabetes Brother     x2  . Arthritis Brother     x3  . Arthritis Sister     x3    ROS:  Please see the history of present illness.  All other systems reviewed and negative.   PHYSICAL EXAM: VS:  BP 140/68  Pulse 76  Ht 5\' 4"  (1.626 m)  Wt 177 lb (80.287 kg)  BMI 30.37 kg/m2 Well nourished, well developed, in no acute distress HEENT: Pupils are equal round react to light accommodation extraocular movements are intact.  Neck: no JVDNo cervical lymphadenopathy. Cardiac: Regular rate and rhythm without murmurs rubs or gallops. Lungs:  clear to auscultation bilaterally, no wheezing, rhonchi or rales Abd: soft, nontender, positive bowel sounds all quadrants, no hepatosplenomegaly Ext: no lower extremity edema.  2+ radial and dorsalis pedis pulses. Skin: warm and dry Neuro:  Grossly normal  EKG:    Sinus rhythm possible HO enlargement  ASSESSMENT AND PLAN:  Problem List Items Addressed This Visit   PVD, Lt SFA stenting 12/10/12      Patient has had improvement in lower extremity arterial Dopplers since her last study in January 2014. She reports no symptoms at this time.   We'll continue to monitor with Dopplers in January.    HYPERTENSION - Primary      Pressures mildly elevated.  No changes to current medical therapy at this time.

## 2013-08-16 ENCOUNTER — Encounter: Payer: Self-pay | Admitting: *Deleted

## 2013-08-16 DIAGNOSIS — B351 Tinea unguium: Secondary | ICD-10-CM | POA: Insufficient documentation

## 2013-10-02 ENCOUNTER — Ambulatory Visit: Payer: Medicare Other | Admitting: Podiatry

## 2013-10-03 ENCOUNTER — Other Ambulatory Visit: Payer: Self-pay

## 2013-10-21 ENCOUNTER — Ambulatory Visit (INDEPENDENT_AMBULATORY_CARE_PROVIDER_SITE_OTHER): Payer: Medicare Other | Admitting: Podiatry

## 2013-10-21 ENCOUNTER — Encounter: Payer: Self-pay | Admitting: Podiatry

## 2013-10-21 VITALS — BP 126/55 | HR 88 | Resp 16

## 2013-10-21 DIAGNOSIS — M79609 Pain in unspecified limb: Secondary | ICD-10-CM

## 2013-10-21 DIAGNOSIS — B351 Tinea unguium: Secondary | ICD-10-CM

## 2013-10-21 NOTE — Progress Notes (Signed)
°  Subjective:    Patient ID: [name], female    DOB: Jun 24, 1941, 72 y.o.   MRN: [id number]  HPI trim my nails on my right foot only  Patient presents for debridement of mycotic toenails on the right foot. She has peripheral arterial disease and has had a left partial foot amputation.  Review of Systems     Objective:   Physical Exam 72 year old black female orientated x3.  Trans-metatarsal amputation left noted. Right hallux nails are black, brown, dystrophic incurvated and tender to palpation.        Assessment & Plan:   Assessment: Peripheral arterial disease Symptomatic onychomycoses x5, right foot  Plan: All 5 nails are debrided back without any bleeding. Reappoint at three-month intervals.

## 2013-11-18 ENCOUNTER — Encounter (HOSPITAL_COMMUNITY): Payer: Self-pay | Admitting: *Deleted

## 2013-11-18 ENCOUNTER — Other Ambulatory Visit (HOSPITAL_COMMUNITY): Payer: Self-pay | Admitting: Cardiovascular Disease

## 2013-11-18 DIAGNOSIS — I739 Peripheral vascular disease, unspecified: Secondary | ICD-10-CM

## 2013-11-26 ENCOUNTER — Emergency Department (HOSPITAL_COMMUNITY): Payer: Medicare Other

## 2013-11-26 ENCOUNTER — Encounter (HOSPITAL_COMMUNITY): Payer: Self-pay | Admitting: Emergency Medicine

## 2013-11-26 ENCOUNTER — Inpatient Hospital Stay (HOSPITAL_COMMUNITY)
Admission: EM | Admit: 2013-11-26 | Discharge: 2013-12-04 | DRG: 193 | Disposition: A | Discharge status: Home Health | Payer: Medicare Other | Attending: Internal Medicine | Admitting: Internal Medicine

## 2013-11-26 DIAGNOSIS — J9620 Acute and chronic respiratory failure, unspecified whether with hypoxia or hypercapnia: Secondary | ICD-10-CM | POA: Diagnosis present

## 2013-11-26 DIAGNOSIS — D509 Iron deficiency anemia, unspecified: Secondary | ICD-10-CM | POA: Diagnosis present

## 2013-11-26 DIAGNOSIS — F17200 Nicotine dependence, unspecified, uncomplicated: Secondary | ICD-10-CM | POA: Diagnosis present

## 2013-11-26 DIAGNOSIS — R0600 Dyspnea, unspecified: Secondary | ICD-10-CM

## 2013-11-26 DIAGNOSIS — F411 Generalized anxiety disorder: Secondary | ICD-10-CM | POA: Diagnosis present

## 2013-11-26 DIAGNOSIS — N289 Disorder of kidney and ureter, unspecified: Secondary | ICD-10-CM | POA: Diagnosis present

## 2013-11-26 DIAGNOSIS — L4050 Arthropathic psoriasis, unspecified: Secondary | ICD-10-CM

## 2013-11-26 DIAGNOSIS — J449 Chronic obstructive pulmonary disease, unspecified: Secondary | ICD-10-CM | POA: Diagnosis present

## 2013-11-26 DIAGNOSIS — E119 Type 2 diabetes mellitus without complications: Secondary | ICD-10-CM | POA: Diagnosis present

## 2013-11-26 DIAGNOSIS — J189 Pneumonia, unspecified organism: Principal | ICD-10-CM | POA: Diagnosis present

## 2013-11-26 DIAGNOSIS — I10 Essential (primary) hypertension: Secondary | ICD-10-CM | POA: Diagnosis present

## 2013-11-26 DIAGNOSIS — E871 Hypo-osmolality and hyponatremia: Secondary | ICD-10-CM | POA: Diagnosis present

## 2013-11-26 DIAGNOSIS — E8770 Fluid overload, unspecified: Secondary | ICD-10-CM

## 2013-11-26 DIAGNOSIS — K2270 Barrett's esophagus without dysplasia: Secondary | ICD-10-CM

## 2013-11-26 DIAGNOSIS — J441 Chronic obstructive pulmonary disease with (acute) exacerbation: Secondary | ICD-10-CM | POA: Diagnosis present

## 2013-11-26 DIAGNOSIS — K219 Gastro-esophageal reflux disease without esophagitis: Secondary | ICD-10-CM | POA: Diagnosis present

## 2013-11-26 DIAGNOSIS — E872 Acidosis: Secondary | ICD-10-CM

## 2013-11-26 LAB — CBC WITH DIFFERENTIAL/PLATELET
Basophils Absolute: 0 10*3/uL (ref 0.0–0.1)
Basophils Relative: 0 % (ref 0–1)
Eosinophils Absolute: 0 10*3/uL (ref 0.0–0.7)
Hemoglobin: 9.4 g/dL — ABNORMAL LOW (ref 12.0–15.0)
Lymphocytes Relative: 4 % — ABNORMAL LOW (ref 12–46)
MCHC: 29.2 g/dL — ABNORMAL LOW (ref 30.0–36.0)
MCV: 80.3 fL (ref 78.0–100.0)
Monocytes Relative: 1 % — ABNORMAL LOW (ref 3–12)
Neutro Abs: 6 10*3/uL (ref 1.7–7.7)
Neutrophils Relative %: 95 % — ABNORMAL HIGH (ref 43–77)
RBC: 4.01 MIL/uL (ref 3.87–5.11)
RDW: 19.5 % — ABNORMAL HIGH (ref 11.5–15.5)

## 2013-11-26 LAB — BLOOD GAS, ARTERIAL
Acid-Base Excess: 0.2 mmol/L (ref 0.0–2.0)
Drawn by: 331471
O2 Content: 8 L/min
O2 Saturation: 96.3 %
Patient temperature: 98.6
pCO2 arterial: 69.7 mmHg (ref 35.0–45.0)
pO2, Arterial: 102 mmHg — ABNORMAL HIGH (ref 80.0–100.0)

## 2013-11-26 LAB — COMPREHENSIVE METABOLIC PANEL
Albumin: 3.4 g/dL — ABNORMAL LOW (ref 3.5–5.2)
BUN: 17 mg/dL (ref 6–23)
CO2: 25 mEq/L (ref 19–32)
Calcium: 9.7 mg/dL (ref 8.4–10.5)
Chloride: 89 mEq/L — ABNORMAL LOW (ref 96–112)
Creatinine, Ser: 0.92 mg/dL (ref 0.50–1.10)
Potassium: 4.4 mEq/L (ref 3.7–5.3)
Total Bilirubin: <0.2 mg/dL — ABNORMAL LOW (ref 0.3–1.2)
Total Protein: 7.2 g/dL (ref 6.0–8.3)

## 2013-11-26 LAB — CBC
Hemoglobin: 9.2 g/dL — ABNORMAL LOW (ref 12.0–15.0)
MCH: 23.5 pg — ABNORMAL LOW (ref 26.0–34.0)
MCHC: 29.5 g/dL — ABNORMAL LOW (ref 30.0–36.0)
Platelets: 186 10*3/uL (ref 150–400)
RDW: 19.4 % — ABNORMAL HIGH (ref 11.5–15.5)

## 2013-11-26 LAB — MAGNESIUM: Magnesium: 1.4 mg/dL — ABNORMAL LOW (ref 1.5–2.5)

## 2013-11-26 LAB — PROTIME-INR: Prothrombin Time: 13.1 seconds (ref 11.6–15.2)

## 2013-11-26 LAB — BASIC METABOLIC PANEL
BUN: 16 mg/dL (ref 6–23)
Calcium: 9.5 mg/dL (ref 8.4–10.5)
Creatinine, Ser: 0.86 mg/dL (ref 0.50–1.10)
GFR calc Af Amer: 76 mL/min — ABNORMAL LOW (ref >90)
GFR calc non Af Amer: 65 mL/min — ABNORMAL LOW (ref >90)
Glucose, Bld: 333 mg/dL — ABNORMAL HIGH (ref 70–99)
Sodium: 132 mEq/L — ABNORMAL LOW (ref 137–147)

## 2013-11-26 LAB — POCT I-STAT TROPONIN I: Troponin i, poc: 0.05 ng/mL (ref 0.00–0.08)

## 2013-11-26 LAB — APTT: aPTT: 26 seconds (ref 24–37)

## 2013-11-26 LAB — GLUCOSE, CAPILLARY: Glucose-Capillary: 461 mg/dL — ABNORMAL HIGH (ref 70–99)

## 2013-11-26 LAB — PHOSPHORUS: Phosphorus: 4.3 mg/dL (ref 2.3–4.6)

## 2013-11-26 LAB — MRSA PCR SCREENING: MRSA by PCR: NEGATIVE

## 2013-11-26 MED ORDER — SODIUM CHLORIDE 0.9 % IJ SOLN
3.0000 mL | Freq: Two times a day (BID) | INTRAMUSCULAR | Status: DC
  Administered 2013-11-29: 3 mL via INTRAVENOUS
  Administered 2013-12-02 (×2): via INTRAVENOUS
  Administered 2013-12-03 – 2013-12-04 (×3): 3 mL via INTRAVENOUS

## 2013-11-26 MED ORDER — IPRATROPIUM-ALBUTEROL 0.5-2.5 (3) MG/3ML IN SOLN
3.0000 mL | RESPIRATORY_TRACT | Status: DC | PRN
  Administered 2013-11-28 – 2013-11-30 (×2): 3 mL via RESPIRATORY_TRACT
  Filled 2013-11-26 (×2): qty 3

## 2013-11-26 MED ORDER — SODIUM CHLORIDE 0.9 % IV SOLN: INTRAVENOUS | Status: AC

## 2013-11-26 MED ORDER — GLIPIZIDE ER 2.5 MG PO TB24: 2.5000 mg | ORAL_TABLET | Freq: Every day | ORAL | Status: DC

## 2013-11-26 MED ORDER — DEXTROSE 5 % IV SOLN
1.0000 g | INTRAVENOUS | Status: AC
  Administered 2013-11-26 – 2013-12-02 (×7): 1 g via INTRAVENOUS
  Filled 2013-11-26 (×7): qty 10

## 2013-11-26 MED ORDER — METFORMIN HCL 500 MG PO TABS
500.0000 mg | ORAL_TABLET | Freq: Two times a day (BID) | ORAL | Status: DC
  Administered 2013-11-28 – 2013-12-04 (×14): 500 mg via ORAL
  Filled 2013-11-26 (×17): qty 1

## 2013-11-26 MED ORDER — LATANOPROST 0.005 % OP SOLN
1.0000 [drp] | Freq: Every day | OPHTHALMIC | Status: DC
  Administered 2013-11-26 – 2013-12-03 (×8): 1 [drp] via OPHTHALMIC
  Filled 2013-11-26 (×2): qty 2.5

## 2013-11-26 MED ORDER — ONDANSETRON HCL 4 MG/2ML IJ SOLN: 4.0000 mg | Freq: Three times a day (TID) | INTRAMUSCULAR | Status: AC | PRN

## 2013-11-26 MED ORDER — DEXTROSE 50 % IV SOLN: 25.0000 mL | INTRAVENOUS | Status: DC | PRN

## 2013-11-26 MED ORDER — IRBESARTAN 300 MG PO TABS
300.0000 mg | ORAL_TABLET | Freq: Every day | ORAL | Status: DC
  Administered 2013-11-27 – 2013-12-01 (×5): 300 mg via ORAL
  Filled 2013-11-26 (×5): qty 1

## 2013-11-26 MED ORDER — ACETAMINOPHEN 650 MG RE SUPP: 650.0000 mg | Freq: Four times a day (QID) | RECTAL | Status: DC | PRN

## 2013-11-26 MED ORDER — LORAZEPAM 0.5 MG PO TABS: 0.2500 mg | ORAL_TABLET | Freq: Three times a day (TID) | ORAL | Status: DC

## 2013-11-26 MED ORDER — IPRATROPIUM-ALBUTEROL 0.5-2.5 (3) MG/3ML IN SOLN
3.0000 mL | Freq: Four times a day (QID) | RESPIRATORY_TRACT | Status: DC
  Administered 2013-11-27 – 2013-11-30 (×13): 3 mL via RESPIRATORY_TRACT
  Filled 2013-11-26 (×15): qty 3

## 2013-11-26 MED ORDER — DEXTROSE 5 % IV SOLN: 1.0000 g | Freq: Once | INTRAVENOUS | Status: DC

## 2013-11-26 MED ORDER — DEXTROSE 50 % IV SOLN: 50.0000 mL | INTRAVENOUS | Status: DC | PRN

## 2013-11-26 MED ORDER — OLMESARTAN MEDOXOMIL-HCTZ 40-12.5 MG PO TABS: 1.0000 | ORAL_TABLET | Freq: Every day | ORAL | Status: DC

## 2013-11-26 MED ORDER — CLOBETASOL PROPIONATE 0.05 % EX LIQD: 1.0000 | Freq: Two times a day (BID) | CUTANEOUS | Status: DC | PRN

## 2013-11-26 MED ORDER — ALBUTEROL (5 MG/ML) CONTINUOUS INHALATION SOLN
10.0000 mg/h | INHALATION_SOLUTION | Freq: Once | RESPIRATORY_TRACT | Status: AC
  Administered 2013-11-26: 10 mg/h via RESPIRATORY_TRACT

## 2013-11-26 MED ORDER — INSULIN ASPART 100 UNIT/ML ~~LOC~~ SOLN: 0.0000 [IU] | Freq: Every day | SUBCUTANEOUS | Status: DC

## 2013-11-26 MED ORDER — ASPIRIN EC 81 MG PO TBEC
81.0000 mg | DELAYED_RELEASE_TABLET | Freq: Every day | ORAL | Status: DC
  Administered 2013-11-27 – 2013-12-04 (×9): 81 mg via ORAL
  Filled 2013-11-26 (×8): qty 1

## 2013-11-26 MED ORDER — HYDROCHLOROTHIAZIDE 12.5 MG PO CAPS
12.5000 mg | ORAL_CAPSULE | Freq: Every day | ORAL | Status: DC
  Administered 2013-11-27 – 2013-12-01 (×6): 12.5 mg via ORAL
  Filled 2013-11-26 (×5): qty 1

## 2013-11-26 MED ORDER — ALBUTEROL SULFATE (2.5 MG/3ML) 0.083% IN NEBU: 2.5000 mg | INHALATION_SOLUTION | RESPIRATORY_TRACT | Status: DC

## 2013-11-26 MED ORDER — PANTOPRAZOLE SODIUM 40 MG PO TBEC
40.0000 mg | DELAYED_RELEASE_TABLET | Freq: Every day | ORAL | Status: DC
  Administered 2013-11-27 – 2013-11-29 (×3): 40 mg via ORAL
  Filled 2013-11-26 (×3): qty 1

## 2013-11-26 MED ORDER — AMMONIUM LACTATE 12 % EX LOTN: 1.0000 "application " | TOPICAL_LOTION | Freq: Every day | CUTANEOUS | Status: DC | PRN

## 2013-11-26 MED ORDER — INSULIN ASPART 100 UNIT/ML ~~LOC~~ SOLN
5.0000 [IU] | Freq: Once | SUBCUTANEOUS | Status: AC
  Administered 2013-11-26: 5 [IU] via SUBCUTANEOUS

## 2013-11-26 MED ORDER — GLIMEPIRIDE 2 MG PO TABS
2.0000 mg | ORAL_TABLET | Freq: Every day | ORAL | Status: DC
  Administered 2013-11-28 – 2013-12-04 (×7): 2 mg via ORAL
  Filled 2013-11-26 (×9): qty 1

## 2013-11-26 MED ORDER — INSULIN ASPART 100 UNIT/ML ~~LOC~~ SOLN
0.0000 [IU] | SUBCUTANEOUS | Status: DC
Start: 2013-11-27 — End: 2013-12-02
  Administered 2013-11-27 (×3): 9 [IU] via SUBCUTANEOUS
  Administered 2013-11-27: 5 [IU] via SUBCUTANEOUS
  Administered 2013-11-27: 7 [IU] via SUBCUTANEOUS
  Administered 2013-11-28: 2 [IU] via SUBCUTANEOUS
  Administered 2013-11-28: 5 [IU] via SUBCUTANEOUS
  Administered 2013-11-28: 1 [IU] via SUBCUTANEOUS
  Administered 2013-11-28: 3 [IU] via SUBCUTANEOUS
  Administered 2013-11-28 (×2): 1 [IU] via SUBCUTANEOUS
  Administered 2013-11-29: 3 [IU] via SUBCUTANEOUS
  Administered 2013-11-29 – 2013-11-30 (×3): 2 [IU] via SUBCUTANEOUS
  Administered 2013-11-30: 5 [IU] via SUBCUTANEOUS
  Administered 2013-11-30: 7 [IU] via SUBCUTANEOUS
  Administered 2013-11-30: 2 [IU] via SUBCUTANEOUS
  Administered 2013-12-01: 7 [IU] via SUBCUTANEOUS
  Administered 2013-12-01 (×2): 2 [IU] via SUBCUTANEOUS
  Administered 2013-12-01: 1 [IU] via SUBCUTANEOUS
  Administered 2013-12-02: 3 [IU] via SUBCUTANEOUS
  Administered 2013-12-02: 2 [IU] via SUBCUTANEOUS
  Administered 2013-12-02: 3 [IU] via SUBCUTANEOUS
  Administered 2013-12-02: 1 [IU] via SUBCUTANEOUS
  Administered 2013-12-02: 2 [IU] via SUBCUTANEOUS

## 2013-11-26 MED ORDER — HYDROCODONE-ACETAMINOPHEN 5-325 MG PO TABS
1.0000 | ORAL_TABLET | ORAL | Status: DC | PRN
  Administered 2013-11-29 – 2013-11-30 (×2): 2 via ORAL
  Administered 2013-11-30: 1 via ORAL
  Administered 2013-12-02: 2 via ORAL
  Filled 2013-11-26 (×2): qty 2
  Filled 2013-11-26: qty 1
  Filled 2013-11-26 (×2): qty 2

## 2013-11-26 MED ORDER — INSULIN ASPART 100 UNIT/ML ~~LOC~~ SOLN: 0.0000 [IU] | SUBCUTANEOUS | Status: DC

## 2013-11-26 MED ORDER — IPRATROPIUM-ALBUTEROL 0.5-2.5 (3) MG/3ML IN SOLN
3.0000 mL | RESPIRATORY_TRACT | Status: DC
  Administered 2013-11-26 (×2): 3 mL via RESPIRATORY_TRACT
  Filled 2013-11-26: qty 3

## 2013-11-26 MED ORDER — ALPRAZOLAM 0.5 MG PO TABS
0.5000 mg | ORAL_TABLET | Freq: Once | ORAL | Status: AC
  Administered 2013-11-26: 0.5 mg via ORAL
  Filled 2013-11-26: qty 1

## 2013-11-26 MED ORDER — CLOBETASOL PROPIONATE 0.05 % EX CREA: TOPICAL_CREAM | Freq: Two times a day (BID) | CUTANEOUS | Status: DC | PRN

## 2013-11-26 MED ORDER — INSULIN ASPART 100 UNIT/ML ~~LOC~~ SOLN
1.0000 [IU] | SUBCUTANEOUS | Status: DC
  Administered 2013-11-26: 5 [IU] via SUBCUTANEOUS

## 2013-11-26 MED ORDER — ACETAMINOPHEN 325 MG PO TABS
650.0000 mg | ORAL_TABLET | Freq: Four times a day (QID) | ORAL | Status: DC | PRN
  Administered 2013-11-28: 650 mg via ORAL
  Filled 2013-11-26: qty 2

## 2013-11-26 MED ORDER — THEOPHYLLINE ER 300 MG PO TB12: 300.0000 mg | ORAL_TABLET | Freq: Two times a day (BID) | ORAL | Status: DC

## 2013-11-26 MED ORDER — BRINZOLAMIDE 1 % OP SUSP
1.0000 [drp] | Freq: Three times a day (TID) | OPHTHALMIC | Status: DC
  Administered 2013-11-26 – 2013-12-04 (×23): 1 [drp] via OPHTHALMIC
  Filled 2013-11-26: qty 10

## 2013-11-26 MED ORDER — DEXTROSE 5 % IV SOLN
500.0000 mg | INTRAVENOUS | Status: DC
  Administered 2013-11-26 – 2013-11-29 (×4): 500 mg via INTRAVENOUS
  Filled 2013-11-26 (×5): qty 500

## 2013-11-26 MED ORDER — METHYLPREDNISOLONE SODIUM SUCC 125 MG IJ SOLR
60.0000 mg | Freq: Two times a day (BID) | INTRAMUSCULAR | Status: DC
  Administered 2013-11-26 – 2013-11-27 (×2): 60 mg via INTRAVENOUS
  Filled 2013-11-26 (×4): qty 0.96

## 2013-11-26 MED ORDER — MORPHINE SULFATE 2 MG/ML IJ SOLN
1.0000 mg | INTRAMUSCULAR | Status: DC | PRN
  Administered 2013-11-28 – 2013-11-30 (×4): 1 mg via INTRAVENOUS
  Filled 2013-11-26 (×4): qty 1

## 2013-11-26 MED ORDER — GUAIFENESIN-DM 100-10 MG/5ML PO SYRP
5.0000 mL | ORAL_SOLUTION | ORAL | Status: DC | PRN
  Administered 2013-11-28 (×2): 5 mL via ORAL
  Filled 2013-11-26 (×2): qty 10

## 2013-11-26 MED ORDER — ONDANSETRON HCL 4 MG PO TABS: 4.0000 mg | ORAL_TABLET | Freq: Four times a day (QID) | ORAL | Status: DC | PRN

## 2013-11-26 MED ORDER — THEOPHYLLINE ER 300 MG PO TB12
300.0000 mg | ORAL_TABLET | Freq: Two times a day (BID) | ORAL | Status: DC
  Administered 2013-11-26 – 2013-11-30 (×7): 300 mg via ORAL
  Filled 2013-11-26 (×10): qty 1

## 2013-11-26 MED ORDER — INSULIN ASPART 100 UNIT/ML ~~LOC~~ SOLN: 0.0000 [IU] | Freq: Three times a day (TID) | SUBCUTANEOUS | Status: DC

## 2013-11-26 MED ORDER — IPRATROPIUM BROMIDE 0.02 % IN SOLN: 0.5000 mg | RESPIRATORY_TRACT | Status: DC

## 2013-11-26 MED ORDER — ONDANSETRON HCL 4 MG/2ML IJ SOLN: 4.0000 mg | Freq: Four times a day (QID) | INTRAMUSCULAR | Status: DC | PRN

## 2013-11-26 MED ORDER — ALPRAZOLAM 0.5 MG PO TABS
0.5000 mg | ORAL_TABLET | Freq: Two times a day (BID) | ORAL | Status: DC | PRN
  Administered 2013-11-27 – 2013-12-02 (×8): 0.5 mg via ORAL
  Filled 2013-11-26 (×9): qty 1

## 2013-11-26 MED ORDER — FLUTICASONE PROPIONATE 50 MCG/ACT NA SUSP: 2.0000 | Freq: Every day | NASAL | Status: DC | PRN

## 2013-11-26 NOTE — ED Notes (Signed)
Pt to go to room 1236 after blood cultures drawn.

## 2013-11-26 NOTE — ED Notes (Signed)
Patient transported to X-ray 

## 2013-11-26 NOTE — H&P (Signed)
[facility] History and Physical  [name] EAV:[id number] DOB: 1941-05-18 DOA: 11/26/2013  Referring physician: ER physician PCP: [name], MD   Chief Complaint: shortness of breath  HPI:  72 year old female with past medical history of COPD, DM, hypertension GERD, glaucoma who presented to [facility] ED 11/26/2013 with complaints of worsening shortness of breath and subjective fevers and cough at home over past few days prior to this admission. Pt used nebulizer treatments at home without significant symptomatic relief. No chest pain, no palpitations. No abdominal pain, nausea or vomiting. No lightheadedness or loss of consciousness. In ED, her O2 saturation was 98% but she was already on nasal canula for oxygen support. BP was 142/83, HR was 83 and Tmax was 99.9 F. Her CXR revealed cardiomegaly with possible pulmonary vascular congestion. She was given nebulizer treatment x 3 and solumedrol 125 mg IV one dose but continue to exhibit shortness of breath prompting admission to SDU for possible need for BiPAP.  Assessment and Plan:  Principal Problem:   Acute exacerbation of chronic obstructive pulmonary disease (COPD) - management with nebulizer treatment every 2 hours PRN and every 4 hours scheduled (albuterol and atrovent) - solumedrol 60 mg IV Q 12 hours - oxygen support to keep O2 saturation above 90%; may require BiPAP - may also continue theophylline per home regimen  - antibiotics for possible pneumonia Active Problems:   Community acquired pneumonia - pneumonia order set in place - started azithromycin and rocephin - obtain flu test, legionella and strep penumoniae - follow up blood culture results   DIABETES-TYPE 2 - continue glimepiride and metformin   Iron deficiency anemia - hemoglobin stable on the admission, 9.2 - no indications for transfusion    HYPERTENSION - continue benicar  per home dose   GERD - continue protonix   Radiological Exams  on Admission: Dg Chest 2 View (if Patient Has Fever And/or Copd) 11/26/2013   IMPRESSION: Cardiomegaly. Possible pulmonary venous hypertension. No frank edema or focal pulmonary process.   Electronically Signed   By: Paulina Fusi M.D.   On: 11/26/2013 13:56     Code Status: Full Family Communication: Pt at bedside Disposition Plan: Admit for further evaluation  Manson Passey, MD  [facility] Pager [phone]  Review of Systems:  Constitutional: positive for fever, chills and malaise/fatigue. Negative for diaphoresis.  HENT: Negative for hearing loss, ear pain, nosebleeds, congestion, sore throat, neck pain, tinnitus and ear discharge.   Eyes: Negative for blurred vision, double vision, photophobia, pain, discharge and redness.  Respiratory: positive for cough, shortness of breath, wheezing  Cardiovascular: Negative for chest pain, palpitations, orthopnea, claudication and leg swelling.  Gastrointestinal: Negative for nausea, vomiting and abdominal pain. Negative for heartburn, constipation, blood in stool and melena.  Genitourinary: Negative for dysuria, urgency, frequency, hematuria and flank pain.  Musculoskeletal: Negative for myalgias, back pain, joint pain and falls.  Skin: Negative for itching and rash.  Neurological: Negative for dizziness and positive for weakness. Negative for tingling, tremors, sensory change, speech change, focal weakness, loss of consciousness and headaches.  Endo/Heme/Allergies: Negative for environmental allergies and polydipsia. Does not bruise/bleed easily.  Psychiatric/Behavioral: Negative for suicidal ideas. The patient is not nervous/anxious.      Past Medical History  Diagnosis Date  . COPD (chronic obstructive pulmonary disease)   . Hypertension   . Asthma   . Psoriatic arthritis   . Psoriasis   . GERD (gastroesophageal reflux disease)   . Hiatal hernia   .  Chronic sinus infection   . Heart murmur   . Blood transfusion ~ 1962    "11  units of blood"  . Ankylosing spondylitis   . Chronic back pain   . Dysrhythmia     "palpitations"  . Esophageal dilatation   . Candida esophagitis   . Cirrhosis of liver without mention of alcohol   . Hypercholesteremia   . Peripheral vascular disease   . Exertional dyspnea   . Pneumonia 10/07/11; 2006; 1980's  . Chronic bronchitis     "since childhood" (12/06/2012)  . Type II diabetes mellitus   . Iron deficiency anemia   . External bleeding hemorrhoids   . Sinus headache   . Arthritis     "psorasic"  . OA (osteoarthritis)   . DJD (degenerative joint disease) of knee     "both" (12/06/2012)  . Carpal tunnel syndrome on right   . Anxiety   . Barrett esophagus   . Non-healing ulcer of foot, secondary to diabetes and PVD 12/07/2012  . PVD (peripheral vascular disease) 12/07/2012  . Tobacco abuse 12/07/2012  . Critical lower limb ischemia, with ulcer Lt foot 12/07/2012  . Gout   . Psoriasis (a type of skin inflammation)   . COPD (chronic obstructive pulmonary disease)   . Carotid artery disease     dopplers showed mod L ICA stenosis   . PAD (peripheral artery disease)   . History of nuclear stress test 11/2012    lexiscan; normal, low risk   . Dermatophytosis of nail [id number]   Past Surgical History  Procedure Laterality Date  . Nasal sinus surgery  09/29/2009  . Carpal tunnel release  2011    left hand  . Lipoma excision  2010    "left occipital area"  . Cataract extraction w/ intraocular lens implant  2009/2010    right/left  . Eye surgery    . Oophorectomy      'not sure which one"  . Appendectomy    . Angioplasty  12/10/2012    "LLE"; LSFA Diamondback orbital rotational arthrectomy, PTA , stenting with IDEV stent (left ABI improved from 0.35 to 0.75)  . Abdominal hysterectomy  1976  . Breast biopsy  1960's    "think he did both" (12/06/2012)  . 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  She has a 40 pack-year smoking history. She has never used smokeless tobacco. She reports that she does not drink alcohol or use illicit drugs.  Allergies  Allergen Reactions  . Amlodipine Besy-Benazepril Hcl Other (See Comments)    Nervous/shakiness  . Statins Other (See Comments)    "can't take any of them; cramp me up" (12/06/2012)  . Plavix [Clopidogrel] Rash    Pt states that the doctor told her that her rash was psoriasis and not related to plavix but she states the rash came from the plavix  . Raptiva [Efalizumab] Rash     Family History  Problem Relation Age of Onset  . Lupus Sister     x2  . Lung cancer Sister   . Thyroid disease Sister   . Colon cancer Neg Hx   . Cancer Mother   . Heart Problems Maternal Grandmother   . Cancer Paternal Grandfather   . Diabetes Brother     x2  . Arthritis Brother  x3  . Arthritis Sister     x3     Prior to Admission medications   Medication Sig Start Date End Date Taking? Authorizing Provider  acetaminophen (TYLENOL) 325 MG tablet Take 2 tablets (650 mg total) by mouth every 4 (four) hours as needed. 12/12/12   Wilburt Finlay, PA-C  albuterol (PROVENTIL HFA;VENTOLIN HFA) 108 (90 BASE) MCG/ACT inhaler Inhale 1 puff into the lungs every 6 (six) hours as needed. FOR SHORTNESS OF BREATH    Historical Provider, MD  ALPRAZolam Prudy Feeler) 0.5 MG tablet Take 0.5 mg by mouth at bedtime as needed for sleep.    Historical Provider, MD  ammonium lactate (LAC-HYDRIN) 12 % lotion Apply 1 application topically daily as needed. For skin condition.    Historical Provider, MD  aspirin EC 81 MG tablet Take 81 mg by mouth daily.    Historical Provider, MD  AZOPT 1 % ophthalmic suspension  08/22/13   Historical Provider, MD  BENICAR HCT 40-12.5 MG per tablet  09/03/13   Historical Provider, MD  Clobetasol Propionate (TEMOVATE) 0.05 %  external spray Apply 1 spray topically 2 (two) times daily as needed. For psoriasis flare-ups    Historical Provider, MD  esomeprazole (NEXIUM) 40 MG capsule Take 40 mg by mouth daily before breakfast.    Historical Provider, MD  fluticasone (FLONASE) 50 MCG/ACT nasal spray Place 2 sprays into the nose daily as needed. For allergies    Historical Provider, MD  Fluticasone-Salmeterol (ADVAIR) 250-50 MCG/DOSE AEPB Inhale 1 puff into the lungs every 12 (twelve) hours.      Historical Provider, MD  glimepiride (AMARYL) 2 MG tablet  11/07/13   Historical Provider, MD  GLIPIZIDE XL 2.5 MG 24 hr tablet  07/09/13   Historical Provider, MD  ipratropium-albuterol (DUONEB) 0.5-2.5 (3) MG/3ML SOLN  09/27/13   Historical Provider, MD  LORazepam (ATIVAN) 0.5 MG tablet Take 0.5 tablets (0.25 mg total) by mouth every 8 (eight) hours. 01/11/13   Tora Kindred York, PA-C  metFORMIN (GLUCOPHAGE) 500 MG tablet Take 500 mg by mouth 2 (two) times daily with a meal.    Historical Provider, MD  Multiple Vitamins-Minerals (EYE VITAMINS) CAPS Take 1 capsule by mouth daily.    Historical Provider, MD  Olmesartan Medoxomil (BENICAR PO) Take by mouth.    Historical Provider, MD  predniSONE (DELTASONE) 5 MG tablet Take 5 mg by mouth daily. On Monday, Wednesday, Friday     Historical Provider, MD  ranitidine (ZANTAC) 150 MG capsule Take 150 mg by mouth at bedtime.      Historical Provider, MD  Simethicone (MYLANTA GAS PO) Take 1 tablet by mouth daily as needed. For gas    Historical Provider, MD  theophylline (THEODUR) 200 MG 12 hr tablet Take 300 mg by mouth 2 (two) times daily.     Historical Provider, MD  Travoprost, BAK Free, (TRAVATAMN) 0.004 % SOLN ophthalmic solution Place 1 drop into both eyes at bedtime.     Historical Provider, MD   Physical Exam: Filed Vitals:   11/26/13 1252 11/26/13 1306 11/26/13 1431 11/26/13 1546  BP:  164/59    Pulse: 97 95  114  Temp:  99.9 F (37.7 C)    TempSrc:  Oral    Resp:    32  SpO2:  100% 100% 98%     Physical Exam  Constitutional: in moderate distress, shortness of breath HENT: Normocephalic. Dry mucus membranes Eyes: Conjunctivae and EOM are normal. PERRLA, no scleral icterus.  Neck: Normal ROM. Neck  supple. No JVD. No tracheal deviation CVS: RRR, S1/S2 appreciated  Pulmonary: diminished breath sounds bilaterally, wheezing in upper lung lobes.  Abdominal: Soft. BS +,  no distension, tenderness, rebound or guarding.  Musculoskeletal: Normal range of motion. Pulses palpable Lymphadenopathy: No lymphadenopathy noted, cervical, inguinal. Neuro: Alert. No focal neurologic deficits. Skin: Skin is warm and dry.  Psychiatric: Normal mood and affect.   Labs on Admission:  Basic Metabolic Panel:  Recent Labs Lab 11/26/13 1300  NA 132*  K 4.2  CL 90*  CO2 30  GLUCOSE 333*  BUN 16  CREATININE 0.86  CALCIUM 9.5   Liver Function Tests: No results found for this basename: AST, ALT, ALKPHOS, BILITOT, PROT, ALBUMIN,  in the last 168 hours No results found for this basename: LIPASE, AMYLASE,  in the last 168 hours No results found for this basename: AMMONIA,  in the last 168 hours CBC:  Recent Labs Lab 11/26/13 1300  WBC 7.9  HGB 9.2*  HCT 31.2*  MCV 79.8  PLT 186   Cardiac Enzymes: No results found for this basename: CKTOTAL, CKMB, CKMBINDEX, TROPONINI,  in the last 168 hours BNP: No components found with this basename: POCBNP,  CBG: No results found for this basename: GLUCAP,  in the last 168 hours  If 7PM-7AM, please contact night-coverage [email or web address] Password [facility] 11/26/2013, 3:53 PM

## 2013-11-26 NOTE — ED Notes (Signed)
MD at bedside. 

## 2013-11-26 NOTE — ED Notes (Signed)
Bed: WA17 Expected date:  Expected time:  Means of arrival:  Comments: SOB 

## 2013-11-26 NOTE — ED Provider Notes (Signed)
CSN: [id number]     Arrival date & time 11/26/13  1249 History   First MD Initiated Contact with Patient 11/26/13 1253     Chief Complaint  Patient presents with  . Shortness of Breath   (Consider location/radiation/quality/duration/timing/severity/associated sxs/prior Treatment) HPI Comments: 72 yo female with DM, copd, no home O2, HTN presents with sob worsening for 3 days.  Pt had no improvement with nebs at home.  Pt had solumedrol and neb on route.  No improvement.  Pt has had similar in the past as well as pneumonia. Pt on po abx at home.  No cp.  No recent surgery or blood clot hx.  Similar to previous but more severe  Patient is a 72 y.o. female presenting with shortness of breath. The history is provided by the patient.  Shortness of Breath Severity:  Severe Associated symptoms: cough and fever   Associated symptoms: no abdominal pain, no chest pain, no headaches, no neck pain, no rash and no vomiting     Past Medical History  Diagnosis Date  . COPD (chronic obstructive pulmonary disease)   . Hypertension   . Asthma   . Psoriatic arthritis   . Psoriasis   . GERD (gastroesophageal reflux disease)   . Hiatal hernia   . Chronic sinus infection   . Heart murmur   . Blood transfusion ~ 1962    "11 units of blood"  . Ankylosing spondylitis   . Chronic back pain   . Dysrhythmia     "palpitations"  . Esophageal dilatation   . Candida esophagitis   . Cirrhosis of liver without mention of alcohol   . Hypercholesteremia   . Peripheral vascular disease   . Exertional dyspnea   . Pneumonia 10/07/11; 2006; 1980's  . Chronic bronchitis     "since childhood" (12/06/2012)  . Type II diabetes mellitus   . Iron deficiency anemia   . External bleeding hemorrhoids   . Sinus headache   . Arthritis     "psorasic"  . OA (osteoarthritis)   . DJD (degenerative joint disease) of knee     "both" (12/06/2012)  . Carpal tunnel syndrome on right   . Anxiety   . Barrett esophagus   .  Non-healing ulcer of foot, secondary to diabetes and PVD 12/07/2012  . PVD (peripheral vascular disease) 12/07/2012  . Tobacco abuse 12/07/2012  . Critical lower limb ischemia, with ulcer Lt foot 12/07/2012  . Gout   . Psoriasis (a type of skin inflammation)   . COPD (chronic obstructive pulmonary disease)   . Carotid artery disease     dopplers showed mod L ICA stenosis   . PAD (peripheral artery disease)   . History of nuclear stress test 11/2012    lexiscan; normal, low risk   . Dermatophytosis of nail [id number]   Past Surgical History  Procedure Laterality Date  . Nasal sinus surgery  09/29/2009  . Carpal tunnel release  2011    left hand  . Lipoma excision  2010    "left occipital area"  . Cataract extraction w/ intraocular lens implant  2009/2010    right/left  . Eye surgery    . Oophorectomy      'not sure which one"  . Appendectomy    . Angioplasty  12/10/2012    "LLE"; LSFA Diamondback orbital rotational arthrectomy, PTA , stenting with IDEV stent (left ABI improved from 0.35 to 0.75)  . Abdominal hysterectomy  1976  . Breast biopsy  1960's    "  think he did both" (12/06/2012)  . 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 Lupus Sister     x2  . Lung cancer Sister   . Thyroid disease Sister   . Colon cancer Neg Hx   . Cancer Mother   . Heart Problems Maternal Grandmother   . Cancer Paternal Grandfather   . Diabetes Brother     x2  . Arthritis Brother     x3  . Arthritis Sister     x3   History  Substance Use Topics  . Smoking status: Current Some Day Smoker -- 1.00 packs/day for 40 years    Types: Cigarettes  . Smokeless tobacco: Never Used     Comment: pt states that she smokes "every once in a while"  . Alcohol Use: No     Comment: "quit social drinking years ago"   OB  History   Grav Para Term Preterm Abortions TAB SAB Ect Mult Living                 Review of Systems  Constitutional: Positive for fever. Negative for chills.  HENT: Positive for congestion.   Eyes: Negative for visual disturbance.  Respiratory: Positive for cough, chest tightness and shortness of breath.   Cardiovascular: Negative for chest pain.  Gastrointestinal: Negative for vomiting and abdominal pain.  Genitourinary: Negative for dysuria and flank pain.  Musculoskeletal: Negative for back pain, neck pain and neck stiffness.  Skin: Negative for rash.  Neurological: Negative for light-headedness and headaches.    Allergies  Amlodipine besy-benazepril hcl; Statins; Plavix; and Raptiva  Home Medications   Current Outpatient Rx  Name  Route  Sig  Dispense  Refill  . acetaminophen (TYLENOL) 325 MG tablet   Oral   Take 2 tablets (650 mg total) by mouth every 4 (four) hours as needed.         Marland Kitchen albuterol (PROVENTIL HFA;VENTOLIN HFA) 108 (90 BASE) MCG/ACT inhaler   Inhalation   Inhale 1 puff into the lungs every 6 (six) hours as needed. FOR SHORTNESS OF BREATH         . ALPRAZolam (XANAX) 0.5 MG tablet   Oral   Take 0.5 mg by mouth at bedtime as needed for sleep.         Marland Kitchen ammonium lactate (LAC-HYDRIN) 12 % lotion   Topical   Apply 1 application topically daily as needed. For skin condition.         Marland Kitchen aspirin EC 81 MG tablet   Oral   Take 81 mg by mouth daily.         . AZOPT 1 % ophthalmic suspension               . BENICAR HCT 40-12.5 MG per tablet               . Clobetasol Propionate (TEMOVATE) 0.05 % external spray   Topical   Apply 1 spray topically 2 (two) times daily as needed. For psoriasis flare-ups         . esomeprazole (NEXIUM) 40 MG capsule   Oral   Take 40 mg by mouth daily before breakfast.         . fluticasone (FLONASE) 50 MCG/ACT nasal spray   Nasal   Place 2 sprays into the nose daily  as needed. For allergies          . Fluticasone-Salmeterol (ADVAIR) 250-50 MCG/DOSE AEPB   Inhalation   Inhale 1 puff into the lungs every 12 (twelve) hours.           Marland Kitchen GLIPIZIDE XL 2.5 MG 24 hr tablet               . ipratropium-albuterol (DUONEB) 0.5-2.5 (3) MG/3ML SOLN               . LORazepam (ATIVAN) 0.5 MG tablet   Oral   Take 0.5 tablets (0.25 mg total) by mouth every 8 (eight) hours.   30 tablet   0   . metFORMIN (GLUCOPHAGE) 500 MG tablet   Oral   Take 500 mg by mouth 2 (two) times daily with a meal.         . Multiple Vitamins-Minerals (EYE VITAMINS) CAPS   Oral   Take 1 capsule by mouth daily.         . Olmesartan Medoxomil (BENICAR PO)   Oral   Take by mouth.         . predniSONE (DELTASONE) 5 MG tablet   Oral   Take 5 mg by mouth daily. On Monday, Wednesday, Friday          . ranitidine (ZANTAC) 150 MG capsule   Oral   Take 150 mg by mouth at bedtime.           . Simethicone (MYLANTA GAS PO)   Oral   Take 1 tablet by mouth daily as needed. For gas         . theophylline (THEODUR) 200 MG 12 hr tablet   Oral   Take 300 mg by mouth 2 (two) times daily.          . Travoprost, BAK Free, (TRAVATAMN) 0.004 % SOLN ophthalmic solution   Both Eyes   Place 1 drop into both eyes at bedtime.           BP 164/59  Pulse 95  Temp(Src) 99.9 F (37.7 C) (Oral)  SpO2 100% Physical Exam  Nursing note and vitals reviewed. Constitutional: She is oriented to person, place, and time. She appears well-developed and well-nourished.  HENT:  Head: Normocephalic and atraumatic.   Mild dry mm  Eyes: Conjunctivae are normal. Right eye exhibits no discharge. Left eye exhibits no discharge.  Neck: Normal range of motion. Neck supple. No tracheal deviation present.  Cardiovascular: Normal rate and regular rhythm.   Pulmonary/Chest: She is in respiratory distress (tachypnea, suprasternal retractions). She has wheezes. She has rales.  Abdominal: Soft. She exhibits no distension.  There is no tenderness. There is no guarding.  Musculoskeletal: She exhibits no edema.  Neurological: She is alert and oriented to person, place, and time.  Skin: Skin is warm. No rash noted.  Psychiatric: She has a normal mood and affect.    ED Course  Procedures (including critical care time) CRITICAL CARE Performed by: Enid Skeens   Total critical care time: 35 min  Critical care time was exclusive of separately billable procedures and treating other patients.  Critical care was necessary to treat or prevent imminent or life-threatening deterioration.  Critical care was time spent personally by me on the following activities: development of treatment plan with patient and/or surrogate as well as nursing, discussions with consultants, evaluation of patient's response to treatment, examination of patient, obtaining history from patient or surrogate, ordering and performing treatments and interventions,  ordering and review of laboratory studies, ordering and review of radiographic studies, pulse oximetry and re-evaluation of patient's condition.  Labs Review Labs Reviewed  BASIC METABOLIC PANEL - Abnormal; Notable for the following:    Sodium 132 (*)    Chloride 90 (*)    Glucose, Bld 333 (*)    GFR calc non Af Amer 65 (*)    GFR calc Af Amer 76 (*)    All other components within normal limits  CBC - Abnormal; Notable for the following:    Hemoglobin 9.2 (*)    HCT 31.2 (*)    MCH 23.5 (*)    MCHC 29.5 (*)    RDW 19.4 (*)    All other components within normal limits  BLOOD GAS, ARTERIAL - Abnormal; Notable for the following:    pH, Arterial 7.233 (*)    pCO2 arterial 69.7 (*)    pO2, Arterial 102.0 (*)    Bicarbonate 28.3 (*)    All other components within normal limits  INFLUENZA PANEL BY PCR  POCT I-STAT TROPONIN I   Imaging Review Dg Chest 2 View (if Patient Has Fever And/or Copd)  11/26/2013   CLINICAL DATA:  Short of breath.  History of COPD.  EXAM: CHEST  2  VIEW  COMPARISON:  01/01/2013  FINDINGS: The heart is mildly enlarged. The aorta is unfolded. The may be pulmonary venous hypertension but there is no frank edema, infiltrate, collapse or effusion.  IMPRESSION: Cardiomegaly. Possible pulmonary venous hypertension. No frank edema or focal pulmonary process.   Electronically Signed   By: Paulina Fusi M.D.   On: 11/26/2013 13:56    EKG Interpretation    Date/Time:  Tuesday November 26 2013 13:18:04 EST Ventricular Rate:  95 PR Interval:  130 QRS Duration: 79 QT Interval:  355 QTC Calculation: 446 R Axis:   29 Text Interpretation:  Sinus rhythm Nonspecific repol abnormality, lateral leads Baseline wander in lead(s) II III aVL aVF V2 V4 V5 V6 Poor ekg, no acute findings noted Confirmed by Keagen Heinlen  MD, Neil Errickson ([id number]) on 11/26/2013 2:57:25 PM            MDM   1. Acute exacerbation of chronic obstructive pulmonary disease (COPD)   2. Acute dyspnea   3. Acute respiratory acidosis   Anemia Hyponatremia Hyperglycemia   Pt requiring cont neb on arrival, increased effort to breath. Clinically COPD exac with possible pneumonia. Infl sent as pt will need admission. Rocephin given.   Cont neb and recheck, mild improvement. Resp acidosis on abg.  Plan for trial of Bipap to improve acidosis.   Spoke with [facility] for admission to step down.  The patients results and plan were reviewed and discussed.   Any x-rays performed were personally reviewed by myself.   Differential diagnosis were considered with the presenting HPI.  Diagnosis: above  EKG: reviewed, no acute findings  Admission/ observation were discussed with the admitting physician, patient and/or family and they are comfortable with the plan.     Enid Skeens, MD 11/26/13 [phone]

## 2013-11-26 NOTE — Progress Notes (Signed)
RN took Pt off BIPAP about an hour ago.  Pt currently on 4 LPM [place] and tolerating well at this time, RT to monitor and assess as needed.

## 2013-11-26 NOTE — ED Notes (Addendum)
Per EMS patient with Hx of COPD from home with SOB x 4 days, pt used neb treatment and inhaler at home without improvement. Per EMS patient is currently taking 3rd neb treatment. EMS administered 15 mg albuterol, 1 mg atrovent, 125 mg solumedrol IV. Patient she has been taking prednisone for SOB s/t COPD and previous infection.

## 2013-11-27 DIAGNOSIS — K2270 Barrett's esophagus without dysplasia: Secondary | ICD-10-CM

## 2013-11-27 LAB — GLUCOSE, CAPILLARY
Glucose-Capillary: 215 mg/dL — ABNORMAL HIGH (ref 70–99)
Glucose-Capillary: 264 mg/dL — ABNORMAL HIGH (ref 70–99)
Glucose-Capillary: 379 mg/dL — ABNORMAL HIGH (ref 70–99)
Glucose-Capillary: 583 mg/dL (ref 70–99)

## 2013-11-27 LAB — INFLUENZA PANEL BY PCR (TYPE A & B)
H1N1 flu by pcr: NOT DETECTED
Influenza A By PCR: NEGATIVE
Influenza B By PCR: NEGATIVE

## 2013-11-27 LAB — COMPREHENSIVE METABOLIC PANEL
ALT: 23 U/L (ref 0–35)
Albumin: 3 g/dL — ABNORMAL LOW (ref 3.5–5.2)
Alkaline Phosphatase: 97 U/L (ref 39–117)
BUN: 21 mg/dL (ref 6–23)
Calcium: 9.3 mg/dL (ref 8.4–10.5)
Creatinine, Ser: 0.99 mg/dL (ref 0.50–1.10)
GFR calc Af Amer: 64 mL/min — ABNORMAL LOW (ref >90)
GFR calc non Af Amer: 55 mL/min — ABNORMAL LOW (ref >90)
Glucose, Bld: 415 mg/dL — ABNORMAL HIGH (ref 70–99)
Sodium: 131 mEq/L — ABNORMAL LOW (ref 137–147)
Total Protein: 6.8 g/dL (ref 6.0–8.3)

## 2013-11-27 LAB — CBC
HCT: 31.8 % — ABNORMAL LOW (ref 36.0–46.0)
Hemoglobin: 9.1 g/dL — ABNORMAL LOW (ref 12.0–15.0)
RDW: 19.4 % — ABNORMAL HIGH (ref 11.5–15.5)
WBC: 6 10*3/uL (ref 4.0–10.5)

## 2013-11-27 LAB — STREP PNEUMONIAE URINARY ANTIGEN: Strep Pneumo Urinary Antigen: NEGATIVE

## 2013-11-27 MED ORDER — SIMETHICONE 80 MG PO CHEW
80.0000 mg | CHEWABLE_TABLET | Freq: Four times a day (QID) | ORAL | Status: DC | PRN
  Administered 2013-11-27 – 2013-12-03 (×2): 80 mg via ORAL
  Filled 2013-11-27 (×3): qty 1

## 2013-11-27 MED ORDER — METHYLPREDNISOLONE SODIUM SUCC 125 MG IJ SOLR
60.0000 mg | INTRAMUSCULAR | Status: DC
  Administered 2013-11-28 – 2013-11-30 (×3): 60 mg via INTRAVENOUS
  Filled 2013-11-27: qty 2
  Filled 2013-11-27 (×4): qty 0.96

## 2013-11-27 MED ORDER — GLUCERNA SHAKE PO LIQD
237.0000 mL | Freq: Two times a day (BID) | ORAL | Status: DC
  Administered 2013-11-27 – 2013-12-02 (×5): 237 mL via ORAL
  Filled 2013-11-27 (×15): qty 237

## 2013-11-27 MED ORDER — MENTHOL 3 MG MT LOZG
1.0000 | LOZENGE | OROMUCOSAL | Status: DC | PRN
  Filled 2013-11-27: qty 9

## 2013-11-27 NOTE — Progress Notes (Signed)
[facility] PROGRESS NOTE  [name] FAO:[id number] DOB: 02/15/1941 DOA: 11/26/2013 PCP: [name], MD  Brief narrative: 72 year old female with past medical history of COPD, DM, hypertension GERD, glaucoma who presented to [facility] ED 11/26/2013 with complaints of worsening shortness of breath, subjective fevers and cough at home over past few days prior to this admission. In ED, her O2 saturation was 98% but she was already on nasal canula for oxygen support. BP was 142/83, HR was 83 and Tmax was 99.9 F. Her CXR revealed cardiomegaly with possible pulmonary vascular congestion. She was given nebulizer treatment x 3 and solumedrol 125 mg IV one dose but continue to exhibit shortness of breath prompting admission to SDU.  Assessment and Plan:   Principal Problem:  Acute exacerbation of chronic obstructive pulmonary disease (COPD)  - continue nebulizer treatment every 2 hours PRN and every 4 hours scheduled (albuterol and atrovent)  - continue solumedrol 60 mg IV but decrease to Q 24 hours  - oxygen support to keep O2 saturation above 90%; may require BiPAP  - continue theophylline per home regimen  - antibiotics for possible pneumonia  Active Problems:  Community acquired pneumonia  - pneumonia order set in place  - continue  azithromycin and rocephin  - flu and strep penumoniae - negative - blood culture - no growth to date  DIABETES-TYPE 2  - continue glimepiride and metformin  Iron deficiency anemia  - hemoglobin stable on the admission, 9.2  - no indications for transfusion  HYPERTENSION  - continue benicar per home dose  GERD  - continue protonix   Radiological Exams on Admission:  Dg Chest 2 View (if Patient Has Fever And/or Copd)  11/26/2013 IMPRESSION: Cardiomegaly. Possible pulmonary venous hypertension. No frank edema or focal pulmonary process. Electronically Signed By: Paulina Fusi M.D. On: 11/26/2013 13:56   Code Status: Full  Family  Communication: family not at the bedside  Disposition Plan: transfer to medical floor today    Manson Passey, MD  [facility] Pager [phone]  If 7PM-7AM, please contact night-coverage [email or web address] Password [email or web address] 11/27/2013, 4:01 PM   LOS: 1 day   Consultants:  None   Procedures:  None   Antibiotics:  Azithromycin 11/26/2013 -->  Rocephin 11/16/2013 -->  HPI/Subjective: No overnight events.   Objective: Filed Vitals:   11/27/13 0800 11/27/13 0922 11/27/13 1000 11/27/13 1200  BP: 124/46  114/39   Pulse: 73  81   Temp: 97.8 F (36.6 C)   98.2 F (36.8 C)  TempSrc: Axillary   Axillary  Resp: 24  21   Height: 5\' 5"  (1.651 m)     Weight:      SpO2: 99% 96% 97%     Intake/Output Summary (Last 24 hours) at 11/27/13 1601 Last data filed at 11/27/13 0600  Gross per 24 hour  Intake   1200 ml  Output    775 ml  Net    425 ml    Exam:   General:  Pt is alert, follows commands appropriately, not in acute distress  Cardiovascular: Regular rate and rhythm, S1/S2 appreciated   Respiratory: Clear to auscultation bilaterally, no wheezing, no crackles, no rhonchi  Abdomen: Soft, non tender, non distended, bowel sounds present, no guarding  Extremities: No edema, pulses DP and PT palpable bilaterally  Neuro: Grossly nonfocal  Data Reviewed: Basic Metabolic Panel:  Recent Labs Lab 11/26/13 1300 11/26/13 1828 11/27/13 0355  NA 132* 129* 131*  K 4.2 4.4 4.8  CL  90* 89* 91*  CO2 30 25 29   GLUCOSE 333* 462* 415*  BUN 16 17 21   CREATININE 0.86 0.92 0.99  CALCIUM 9.5 9.7 9.3  MG  --  1.4*  --   PHOS  --  4.3  --    Liver Function Tests:  Recent Labs Lab 11/26/13 1828 11/27/13 0355  AST 28 32  ALT 21 23  ALKPHOS 102 97  BILITOT <0.2* <0.2*  PROT 7.2 6.8  ALBUMIN 3.4* 3.0*   No results found for this basename: LIPASE, AMYLASE,  in the last 168 hours No results found for this basename: AMMONIA,  in the last 168 hours CBC:  Recent  Labs Lab 11/26/13 1300 11/26/13 1828 11/27/13 0355  WBC 7.9 6.4 6.0  NEUTROABS  --  6.0  --   HGB 9.2* 9.4* 9.1*  HCT 31.2* 32.2* 31.8*  MCV 79.8 80.3 81.7  PLT 186 171 170   Cardiac Enzymes: No results found for this basename: CKTOTAL, CKMB, CKMBINDEX, TROPONINI,  in the last 168 hours BNP: No components found with this basename: POCBNP,  CBG:  Recent Labs Lab 11/26/13 1942 11/26/13 2343 11/27/13 0356 11/27/13 0732 11/27/13 1151  GLUCAP 461* 583* 403* 264* 215*    CULTURE, BLOOD (ROUTINE X 2)     Status: None   Collection Time    11/26/13  4:30 PM      Result Value Range Status   Specimen Description BLOOD BLOOD RIGHT FOREARM   Final   Special Requests BOTTLES DRAWN AEROBIC AND ANAEROBIC 4 CC EA   Final   Culture  Setup Time     Final   Value: 11/26/2013 22:48     Performed at [facility]   Culture     Final   Value:        BLOOD CULTURE RECEIVED NO GROWTH TO DATE CULTURE WILL BE HELD FOR 5 DAYS BEFORE ISSUING A FINAL NEGATIVE REPORT     Performed at [facility]   Report Status PENDING   Incomplete  MRSA PCR SCREENING     Status: None   Collection Time    11/26/13  5:55 PM      Result Value Range Status   MRSA by PCR NEGATIVE  NEGATIVE Final  CULTURE, BLOOD (ROUTINE X 2)     Status: None   Collection Time    11/26/13  6:27 PM      Result Value Range Status   Specimen Description BLOOD RIGHT HAND   Final   Special Requests BOTTLES DRAWN AEROBIC ONLY 5CC   Final   Culture  Setup Time     Final   Value: 11/26/2013 22:49     Performed at [facility]   Culture     Final   Value:        BLOOD CULTURE RECEIVED NO GROWTH TO DATE CULTURE WILL BE HELD FOR 5 DAYS BEFORE ISSUING A FINAL NEGATIVE REPORT     Performed at [facility]   Report Status PENDING   Incomplete     Studies: Dg Chest 2 View (if Patient Has Fever And/or Copd) 11/26/2013  IMPRESSION: Cardiomegaly. Possible pulmonary venous hypertension. No frank edema or focal  pulmonary process.   Electronically Signed   By: Paulina Fusi M.D.   On: 11/26/2013 13:56    Scheduled Meds: . aspirin EC  81 mg Oral Daily  . azithromycin  500 mg Intravenous Q24H  . brinzolamide  1 drop Both Eyes TID  .  cefTRIAXone (ROCEPHIN)  IV  1 g Intravenous Q24H  . feeding supplement (GLUCERNA SHAKE)  237 mL Oral BID BM  . glimepiride  2 mg Oral Q breakfast  . irbesartan  300 mg Oral Daily   And  . hydrochlorothiazide  12.5 mg Oral Daily  . insulin aspart  0-9 Units Subcutaneous Q4H  . ipratropium-albuterol  3 mL Nebulization QID  . latanoprost  1 drop Both Eyes QHS  . metFORMIN  500 mg Oral BID WC  . methylPREDNISolone (SOLU-MEDROL) injection  60 mg Intravenous Q12H  . pantoprazole  40 mg Oral Daily  . sodium chloride  3 mL Intravenous Q12H  . theophylline  300 mg Oral Q12H   Continuous Infusions:

## 2013-11-27 NOTE — Progress Notes (Signed)
CSW received consult for COPD Gold Protocol, though patient does not meet qualifications (only 1 admission within the past 6 months).   CSW signing off.   Unice Bailey, LCSW [facility] Clinical Social Worker cell #: [phone]

## 2013-11-27 NOTE — Progress Notes (Signed)
PT does not require BiPAP at this time.

## 2013-11-27 NOTE — Progress Notes (Signed)
INITIAL NUTRITION ASSESSMENT  DOCUMENTATION CODES Per approved criteria  -Obesity Unspecified   INTERVENTION: - Glucerna shakes BID - Encouraged increased meal intake - Will continue to monitor   NUTRITION DIAGNOSIS: Increased nutrient needs related to acute COPD exacerbation as evidenced by MD notes.   Goal: Pt to consume >90% of meals/supplements  Monitor:  Weights, labs, intake  Reason for Assessment: Consult   72 y.o. female  Admitting Dx: Acute exacerbation of chronic obstructive pulmonary disease (COPD)  ASSESSMENT: Pt with history of COPD, DM, hypertension GERD, glaucoma who presented to [facility] ED 11/26/2013 with complaints of worsening shortness of breath and subjective fevers and cough at home over past few days prior to this admission.   Met with pt who reports gradually losing weight over the years. Went from 215-220 pounds 2 years ago to 180 pounds currently, however pt weighed 177 pounds 5 months ago so she has been gaining some weight back. Reports trying to follow a bland diet due to having Barrett's esophagus. Reports she has trouble seeing so she eats mostly prepared foods as she doesn't want to hurt herself cooking. Sometimes her sister comes and lives with her and assists her in the kitchen. Pt tries to follow the diabetic diet and reports good blood sugar control PTA. Reports eating 2 meals/day plus snacks. Thinks she has been losing muscle recently in arms and legs.     Height: Ht Readings from Last 1 Encounters:  08/12/13 5\' 4"  (1.626 m)    Weight: Wt Readings from Last 1 Encounters:  11/27/13 180 lb 8.9 oz (81.9 kg)    Ideal Body Weight: 120 lb   % Ideal Body Weight: 150%  Wt Readings from Last 10 Encounters:  11/27/13 180 lb 8.9 oz (81.9 kg)  08/12/13 177 lb (80.287 kg)  07/30/13 177 lb (80.287 kg)  02/13/13 164 lb 8 oz (74.617 kg)  01/11/13 167 lb 1.7 oz (75.8 kg)  01/11/13 167 lb 1.7 oz (75.8 kg)  12/11/12 177 lb 14.6 oz (80.7 kg)  12/11/12  177 lb 14.6 oz (80.7 kg)  12/07/12 171 lb 15.3 oz (78 kg)  12/07/12 171 lb 15.3 oz (78 kg)    Usual Body Weight: 215-220 lbs 2 years ago  % Usual Body Weight: 82-84%  BMI:  Body mass index is 30.98 kg/(m^2). Class I obesity  Estimated Nutritional Needs: Kcal: 1700-1900 Protein: 65-85g Fluid: 1.7-1.9L/day  Skin: Intact  Diet Order: Carb Control  EDUCATION NEEDS: -Education needs addressed - educated pt on high calorie/protein diet as she reports weakness in arm/leg muscles and wants to get more protein in her diet   Intake/Output Summary (Last 24 hours) at 11/27/13 1231 Last data filed at 11/27/13 0600  Gross per 24 hour  Intake   1200 ml  Output    775 ml  Net    425 ml    Last BM: PTA  Labs:   Recent Labs Lab 11/26/13 1300 11/26/13 1828 11/27/13 0355  NA 132* 129* 131*  K 4.2 4.4 4.8  CL 90* 89* 91*  CO2 30 25 29   BUN 16 17 21   CREATININE 0.86 0.92 0.99  CALCIUM 9.5 9.7 9.3  MG  --  1.4*  --   PHOS  --  4.3  --   GLUCOSE 333* 462* 415*    CBG (last 3)   Recent Labs  11/27/13 0356 11/27/13 0732 11/27/13 1151  GLUCAP 403* 264* 215*    Scheduled Meds: . aspirin EC  81 mg Oral Daily  .  azithromycin  500 mg Intravenous Q24H  . brinzolamide  1 drop Both Eyes TID  . cefTRIAXone (ROCEPHIN)  IV  1 g Intravenous Q24H  . glimepiride  2 mg Oral Q breakfast  . irbesartan  300 mg Oral Daily   And  . hydrochlorothiazide  12.5 mg Oral Daily  . insulin aspart  0-9 Units Subcutaneous Q4H  . ipratropium-albuterol  3 mL Nebulization QID  . latanoprost  1 drop Both Eyes QHS  . metFORMIN  500 mg Oral BID WC  . methylPREDNISolone (SOLU-MEDROL) injection  60 mg Intravenous Q12H  . pantoprazole  40 mg Oral Daily  . sodium chloride  3 mL Intravenous Q12H  . theophylline  300 mg Oral Q12H    Continuous Infusions:   Past Medical History  Diagnosis Date  . COPD (chronic obstructive pulmonary disease)   . Hypertension   . Asthma   . Psoriatic arthritis    . Psoriasis   . GERD (gastroesophageal reflux disease)   . Hiatal hernia   . Chronic sinus infection   . Heart murmur   . Blood transfusion ~ 1962    "11 units of blood"  . Ankylosing spondylitis   . Chronic back pain   . Dysrhythmia     "palpitations"  . Esophageal dilatation   . Candida esophagitis   . Cirrhosis of liver without mention of alcohol   . Hypercholesteremia   . Peripheral vascular disease   . Exertional dyspnea   . Pneumonia 10/07/11; 2006; 1980's  . Chronic bronchitis     "since childhood" (12/06/2012)  . Type II diabetes mellitus   . Iron deficiency anemia   . External bleeding hemorrhoids   . Sinus headache   . Arthritis     "psorasic"  . OA (osteoarthritis)   . DJD (degenerative joint disease) of knee     "both" (12/06/2012)  . Carpal tunnel syndrome on right   . Anxiety   . Barrett esophagus   . Non-healing ulcer of foot, secondary to diabetes and PVD 12/07/2012  . PVD (peripheral vascular disease) 12/07/2012  . Tobacco abuse 12/07/2012  . Critical lower limb ischemia, with ulcer Lt foot 12/07/2012  . Gout   . Psoriasis (a type of skin inflammation)   . COPD (chronic obstructive pulmonary disease)   . Carotid artery disease     dopplers showed mod L ICA stenosis   . PAD (peripheral artery disease)   . History of nuclear stress test 11/2012    lexiscan; normal, low risk   . Dermatophytosis of nail [id number]    Past Surgical History  Procedure Laterality Date  . Nasal sinus surgery  09/29/2009  . Carpal tunnel release  2011    left hand  . Lipoma excision  2010    "left occipital area"  . Cataract extraction w/ intraocular lens implant  2009/2010    right/left  . Eye surgery    . Oophorectomy      'not sure which one"  . Appendectomy    . Angioplasty  12/10/2012    "LLE"; LSFA [place] orbital rotational arthrectomy, PTA , stenting with IDEV stent (left ABI improved from 0.35 to 0.75)  . Abdominal hysterectomy  1976  . Breast biopsy  1960's     "think he did both" (12/06/2012)  . 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

## 2013-11-27 NOTE — Progress Notes (Signed)
PT does not appear to be in respiratory distress at this time- BiPAP not indicated at this time. Current Sp02 on 3 lpm [place] 96%, HR 79, RR 22.

## 2013-11-27 NOTE — Evaluation (Signed)
Physical Therapy Evaluation Patient Details Name: [name] MRN: [id number] DOB: 1941/08/22 Today's Date: 11/27/2013 Time: 1415-1440 PT Time Calculation (min): 25 min  PT Assessment / Plan / Recommendation History of Present Illness  72 year old female with past medical history of COPD, DM, hypertension GERD, glaucoma who presented to [facility] ED 11/26/2013 with complaints of worsening shortness of breath and subjective fevers and cough at home over past few days prior to this admission. Pt used nebulizer treatments at home without significant symptomatic relief.   Clinical Impression  Pt mobilizing but limited by dyspnea. Pt will benefit from PT to address problems listed. Pt lives alone and may not have 24 hour caregivers. Pt ma benefit from ST snf if needs  Are greater than PTA.    PT Assessment  Patient needs continued PT services    Follow Up Recommendations  SNF;Supervision/Assistance - 24 hour (pt may not have 24/7)    Does the patient have the potential to tolerate intense rehabilitation      Barriers to Discharge Decreased caregiver support      Equipment Recommendations  None recommended by PT    Recommendations for Other Services     Frequency Min 3X/week    Precautions / Restrictions Precautions Precautions: Fall Precaution Comments: monitor  sats.   Pertinent Vitals/Pain sats 91% during mobility and talking on 3 l.      Mobility  Bed Mobility Bed Mobility: Supine to Sit;Sit to Supine Supine to Sit: 4: Min guard;HOB elevated Sit to Supine: 4: Min guard;HOB elevated Details for Bed Mobility Assistance: extra time to get breath due to dyspnea. encouraged to rest at times. Transfers Transfers: Sit to Stand;Stand to Sit;Stand Pivot Transfers Sit to Stand: 4: Min assist;From bed;From chair/3-in-1;With upper extremity assist Stand to Sit: 4: Min assist;To bed;To chair/3-in-1;With upper extremity assist Stand Pivot Transfers: 4: Min assist Details for Transfer  Assistance: pt is able to stand at [facility] and pivot to [facility] Ambulation/Gait Ambulation/Gait Assistance: Not tested (comment)    Exercises     PT Diagnosis: Difficulty walking;Generalized weakness  PT Problem List: Decreased strength;Decreased activity tolerance;Decreased mobility;Decreased knowledge of use of DME;Decreased safety awareness;Decreased knowledge of precautions;Cardiopulmonary status limiting activity PT Treatment Interventions: DME instruction;Gait training;Stair training;Functional mobility training;Therapeutic activities;Therapeutic exercise;Patient/family education     PT Goals(Current goals can be found in the care plan section) Acute Rehab PT Goals Patient Stated Goal: i want to do for myself. PT Goal Formulation: With patient Time For Goal Achievement: 12/11/13 Potential to Achieve Goals: Good  Visit Information  Last PT Received On: 11/27/13 Assistance Needed: +2 PT/OT/SLP Co-Evaluation/Treatment: Yes Reason for Co-Treatment: For patient/therapist safety PT goals addressed during session: Mobility/safety with mobility;Proper use of DME;Strengthening/ROM History of Present Illness: 72 year old female with past medical history of COPD, DM, hypertension GERD, glaucoma who presented to [facility] ED 11/26/2013 with complaints of worsening shortness of breath and subjective fevers and cough at home over past few days prior to this admission. Pt used nebulizer treatments at home without significant symptomatic relief.        Prior Functioning  Home Living Family/patient expects to be discharged to:: Private residence Living Arrangements: Other relatives Available Help at Discharge: Family Type of Home: House Home Access: Stairs to enter Secretary/administrator of Steps: 1 Entrance Stairs-Rails: None Home Layout: One level Home Equipment: Environmental consultant - 4 wheels;Walker - 2 wheels;Bedside commode;Cane - single point Prior Function Level of Independence: Independent with assistive  device(s) Communication Communication: No difficulties  Cognition  Cognition Arousal/Alertness: Awake/alert Behavior During Therapy: WFL for tasks assessed/performed Overall Cognitive Status: Within Functional Limits for tasks assessed    Extremity/Trunk Assessment Upper Extremity Assessment Upper Extremity Assessment: Defer to OT evaluation Lower Extremity Assessment Lower Extremity Assessment: Generalized weakness;LLE deficits/detail LLE Deficits / Details: Transmet amputation 2/14 Cervical / Trunk Assessment Cervical / Trunk Assessment: Normal   Balance Balance Balance Assessed: Yes Static Sitting Balance Static Sitting - Balance Support: No upper extremity supported;Feet supported Static Sitting - Level of Assistance: 7: Independent  End of Session PT - End of Session Activity Tolerance: Patient limited by fatigue;Treatment limited secondary to medical complications (Comment) (dyspnea) Patient left: in bed;with call bell/phone within reach;with bed alarm set Nurse Communication: Mobility status  GP     Rada Hay 11/27/2013, 3:22 PM

## 2013-11-27 NOTE — Progress Notes (Signed)
CARE MANAGEMENT NOTE 11/27/2013  Patient:  [name], [name]   Account Number:  [id number]  Date Initiated:  11/27/2013  Documentation initiated by:  Saraiya Kozma  Subjective/Objective Assessment:   copd excerbation unresponsive to out patient treatments     Action/Plan:   home when stable   Anticipated DC Date:  11/30/2013   Anticipated DC Plan:  HOME/SELF CARE         Choice offered to / List presented to:             Status of service:  In process, will continue to follow Medicare Important Message given?   (If response is "NO", the following Medicare IM given date fields will be blank) Date Medicare IM given:   Date Additional Medicare IM given:    Discharge Disposition:    Per UR Regulation:  Reviewed for med. necessity/level of care/duration of stay  If discussed at Long Length of Stay Meetings, dates discussed:    Comments:  [id number]/Antario Yasuda Stark Jock, BSN, [place] [phone] Chart Reviewed for discharge and hospital needs. Discharge needs at time of review:  None present will follow for needs. Review of patient progress due on [id number].

## 2013-11-27 NOTE — Progress Notes (Signed)
PT does not require BiPAP usage at this time.

## 2013-11-27 NOTE — Evaluation (Signed)
Occupational Therapy Evaluation Patient Details Name: [name] MRN: [id number] DOB: [birth year] Today's Date: 11/27/2013 Time: 4098-1191 OT Time Calculation (min): 24 min  OT Assessment / Plan / Recommendation History of present illness 72 year old female with past medical history of COPD, DM, hypertension GERD, glaucoma who presented to [facility] ED 11/26/2013 with complaints of worsening shortness of breath and subjective fevers and cough at home over past few days prior to this admission. Pt used nebulizer treatments at home without significant symptomatic relief.    Clinical Impression   Pt was admitted for the above.  At baseline, she is mod I with adls.  She currently needs min A.  Pt has a sister and brother who can help intermittently.  She will benefit from skilled OT to increase safety and independence with adls with supervision level goals in acute.      OT Assessment  Patient needs continued OT Services    Follow Up Recommendations  Home health OT;SNF (depending upon progress)    Barriers to Discharge Decreased caregiver support    Equipment Recommendations  None recommended by OT (HHOT can further assess for tub dme)    Recommendations for Other Services    Frequency  Min 2X/week    Precautions / Restrictions Precautions Precautions: Fall Precaution Comments: monitor  sats. Restrictions Weight Bearing Restrictions: No   Pertinent Vitals/Pain No c/o pain.  Sats 91% with activity/talking.  Cued for pursed lip breathing    ADL  Grooming: Set up Where Assessed - Grooming: Unsupported sitting Upper Body Bathing: Set up Where Assessed - Upper Body Bathing: Unsupported sitting Lower Body Bathing: Minimal assistance Where Assessed - Lower Body Bathing: Supported sit to stand Upper Body Dressing: Minimal assistance (lines) Where Assessed - Upper Body Dressing: Unsupported sitting Lower Body Dressing: Minimal assistance Where Assessed - Lower Body Dressing: Supported  sit to stand Toilet Transfer: Minimal assistance Toilet Transfer Method: Surveyor, minerals: Materials engineer and Hygiene: Minimal assistance Where Assessed - Engineer, mining and Hygiene: Sit to stand from 3-in-1 or toilet Transfers/Ambulation Related to ADLs: performed spt to [facility] ADL Comments: Pt able to complete adls with min A, extra time to recover breaking.  Has a reacher but has not used for adls.      OT Diagnosis: Generalized weakness  OT Problem List: Decreased strength;Decreased activity tolerance;Impaired balance (sitting and/or standing);Cardiopulmonary status limiting activity OT Treatment Interventions: Self-care/ADL training;DME and/or AE instruction;Patient/family education;Balance training;Energy conservation   OT Goals(Current goals can be found in the care plan section) Acute Rehab OT Goals Patient Stated Goal: i want to do for myself. OT Goal Formulation: With patient Time For Goal Achievement: 12/11/13 Potential to Achieve Goals: Good  Visit Information  Last OT Received On: 11/27/13 Assistance Needed: +2 PT/OT/SLP Co-Evaluation/Treatment: Yes Reason for Co-Treatment: For patient/therapist safety;Other (comment) (pt's decreased endurance) PT goals addressed during session: Mobility/safety with mobility;Proper use of DME;Strengthening/ROM History of Present Illness: 72 year old female with past medical history of COPD, DM, hypertension GERD, glaucoma who presented to [facility] ED 11/26/2013 with complaints of worsening shortness of breath and subjective fevers and cough at home over past few days prior to this admission. Pt used nebulizer treatments at home without significant symptomatic relief.        Prior Functioning     Home Living Family/patient expects to be discharged to:: Private residence Living Arrangements: Other relatives Available Help at Discharge: Family;Available  PRN/intermittently Type of Home: House Home Access: Stairs  to enter Entrance Stairs-Number of Steps: 1 Entrance Stairs-Rails: None Home Layout: One level Home Equipment: Walker - 4 wheels;Walker - 2 wheels;Bedside commode;Cane - single point Additional Comments: pt stood in shower  Prior Function Level of Independence: Independent with assistive device(s) Communication Communication: No difficulties         Vision/Perception     Cognition  Cognition Arousal/Alertness: Awake/alert Behavior During Therapy: WFL for tasks assessed/performed Overall Cognitive Status: Within Functional Limits for tasks assessed    Extremity/Trunk Assessment Upper Extremity Assessment Upper Extremity Assessment: Generalized weakness Lower Extremity Assessment Lower Extremity Assessment: Generalized weakness;LLE deficits/detail 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 encouraged to rest at times. Transfers Sit to Stand: 4: Min assist;From bed;From chair/3-in-1;With upper extremity assist Stand to Sit: 4: Min assist;To bed;To chair/3-in-1;With upper extremity assist Details for Transfer Assistance: min A for balance; able to pivot to commode     Exercise     Balance Balance Balance Assessed: Yes Static Sitting Balance Static Sitting - Balance Support: No upper extremity supported;Feet supported Static Sitting - Level of Assistance: 7: Independent   End of Session OT - End of Session Activity Tolerance: Patient tolerated treatment well Patient left: in bed;with call bell/phone within reach  GO     [facility] 11/27/2013, 3:28 PM Marica Otter, OTR/L [phone] 11/27/2013

## 2013-11-28 DIAGNOSIS — E119 Type 2 diabetes mellitus without complications: Secondary | ICD-10-CM

## 2013-11-28 LAB — GLUCOSE, CAPILLARY
GLUCOSE-CAPILLARY: 229 mg/dL — AB (ref 70–99)
GLUCOSE-CAPILLARY: 263 mg/dL — AB (ref 70–99)
Glucose-Capillary: 123 mg/dL — ABNORMAL HIGH (ref 70–99)
Glucose-Capillary: 129 mg/dL — ABNORMAL HIGH (ref 70–99)
Glucose-Capillary: 150 mg/dL — ABNORMAL HIGH (ref 70–99)
Glucose-Capillary: 167 mg/dL — ABNORMAL HIGH (ref 70–99)

## 2013-11-28 LAB — BLOOD GAS, ARTERIAL
Acid-Base Excess: 2.8 mmol/L — ABNORMAL HIGH (ref 0.0–2.0)
BICARBONATE: 31.4 meq/L — AB (ref 20.0–24.0)
Delivery systems: POSITIVE
Drawn by: 317871
Expiratory PAP: 6
FIO2: 0.4 %
Inspiratory PAP: 12
O2 Saturation: 95.1 %
PCO2 ART: 77.5 mmHg — AB (ref 35.0–45.0)
PH ART: 7.228 — AB (ref 7.350–7.450)
Patient temperature: 97.8
TCO2: 30.6 mmol/L (ref 0–100)
pO2, Arterial: 77.6 mmHg — ABNORMAL LOW (ref 80.0–100.0)

## 2013-11-28 MED ORDER — HYDRALAZINE HCL 10 MG PO TABS
10.0000 mg | ORAL_TABLET | Freq: Three times a day (TID) | ORAL | Status: DC
  Administered 2013-11-28 – 2013-12-04 (×19): 10 mg via ORAL
  Filled 2013-11-28 (×22): qty 1

## 2013-11-28 NOTE — Progress Notes (Signed)
PT does not require BiPAP at this time (RN removed from BiPAP at approx 1430).

## 2013-11-28 NOTE — Progress Notes (Signed)
Spoke with Dr. Joya Gaskins (PCCM) per Dr. Rueben Bash request. He is aware of her change in LOC and the BiPAP changes per ABG results. No new orders received.

## 2013-11-28 NOTE — Progress Notes (Signed)
[facility] PROGRESS NOTE  [name] [id number] DOB: 11-Apr-1941 DOA: 11/26/2013 PCP: Leola Brazil, MD  Brief narrative: 73 year old female with past medical history of COPD, DM, hypertension GERD, glaucoma who presented to [facility] ED 11/26/2013 with complaints of worsening shortness of breath, subjective fevers and cough at home over past few days prior to this admission.  In ED, her O2 saturation was 98% but she was already on nasal canula for oxygen support. BP was 142/83, HR was 83 and Tmax was 99.9 F. Her CXR revealed cardiomegaly with possible pulmonary vascular congestion. She was given nebulizer treatment x 3 and solumedrol 125 mg IV one dose but continue to exhibit shortness of breath prompting admission to SDU. She did however require BiPAP intermittently.  Assessment and Plan:   Principal Problem:  Acute exacerbation of chronic obstructive pulmonary disease (COPD)  - continue same nebulizer treatment every 2 hours PRN and every 4 hours scheduled (albuterol and atrovent)  - continue solumedrol 60 mg IV Q 24 hours  - oxygen support to keep O2 saturation above 90%; BiPAP if needed per respiratory therapist - continue theophylline per home regimen  - antibiotics for possible pneumonia (azithromycin and rocephin) Active Problems:  Community acquired pneumonia  - pneumonia order set in place  - continue azithromycin and rocephin  - flu and strep penumoniae - negative  - blood culture - no growth to date  DIABETES-TYPE 2  - continue glimepiride and metformin  - CBG's in past 24 hours: 129, 123, 150 Iron deficiency anemia  - hemoglobin stable on the admission, 9.2  - no indications for transfusion  HYPERTENSION  - continue benicar per home dose  GERD  - continue protonix   Code Status: Full  Family Communication: family not at the bedside  Disposition Plan: if she requires BiPAP she will need to remain in ICU  Consultants:  None  Procedures:  None   Antibiotics:  Azithromycin 11/26/2013 -->  Rocephin 11/16/2013 -->   Leisa Lenz, MD  [facility] Pager [phone]  If 7PM-7AM, please contact night-coverage [email or web address] Password [email or web address] 11/28/2013, 10:53 AM   LOS: 2 days    HPI/Subjective: No acute overnight events. Intermittent use of BiPAP.  Objective: Filed Vitals:   11/28/13 0818 11/28/13 0900 11/28/13 0901 11/28/13 1002  BP:  114/46 161/52 120/46  Pulse:  91 88 81  Temp:      TempSrc:      Resp:  20 28 21   Height:      Weight:      SpO2: 97% 97% 96% 97%    Intake/Output Summary (Last 24 hours) at 11/28/13 1053 Last data filed at 11/28/13 1000  Gross per 24 hour  Intake    580 ml  Output   2600 ml  Net  -2020 ml    Exam:   General:  Pt is alert, follows commands appropriately, not in acute distress  Cardiovascular: Regular rate and rhythm, S1/S2, no murmurs, no rubs, no gallops  Respiratory: Clear to auscultation bilaterally, no wheezing, no crackles, no rhonchi  Abdomen: Soft, non tender, non distended, bowel sounds present, no guarding  Extremities: No edema, pulses DP and PT palpable bilaterally  Neuro: Grossly nonfocal  Data Reviewed: Basic Metabolic Panel:  Recent Labs Lab 11/26/13 1300 11/26/13 1828 11/27/13 0355  NA 132* 129* 131*  K 4.2 4.4 4.8  CL 90* 89* 91*  CO2 30 25 29   GLUCOSE 333* 462* 415*  BUN 16 17 21   CREATININE 0.86 0.92  0.99  CALCIUM 9.5 9.7 9.3  MG  --  1.4*  --   PHOS  --  4.3  --    Liver Function Tests:  Recent Labs Lab 11/26/13 1828 11/27/13 0355  AST 28 32  ALT 21 23  ALKPHOS 102 97  BILITOT <0.2* <0.2*  PROT 7.2 6.8  ALBUMIN 3.4* 3.0*   No results found for this basename: LIPASE, AMYLASE,  in the last 168 hours No results found for this basename: AMMONIA,  in the last 168 hours CBC:  Recent Labs Lab 11/26/13 1300 11/26/13 1828 11/27/13 0355  WBC 7.9 6.4 6.0  NEUTROABS  --  6.0  --   HGB 9.2* 9.4* 9.1*  HCT 31.2* 32.2* 31.8*  MCV  79.8 80.3 81.7  PLT 186 171 170   Cardiac Enzymes: No results found for this basename: CKTOTAL, CKMB, CKMBINDEX, TROPONINI,  in the last 168 hours BNP: No components found with this basename: POCBNP,  CBG:  Recent Labs Lab 11/27/13 1634 11/27/13 2025 11/27/13 2354 11/28/13 0309 11/28/13 0813  GLUCAP 379* 324* 129* 123* 150*    Recent Results (from the past 240 hour(s))  CULTURE, BLOOD (ROUTINE X 2)     Status: None   Collection Time    11/26/13  4:30 PM      Result Value Range Status   Specimen Description BLOOD BLOOD RIGHT FOREARM   Final   Special Requests BOTTLES DRAWN AEROBIC AND ANAEROBIC 4 CC EA   Final   Culture  Setup Time     Final   Value: 11/26/2013 22:48     Performed at [facility]   Culture     Final   Value:        BLOOD CULTURE RECEIVED NO GROWTH TO DATE CULTURE WILL BE HELD FOR 5 DAYS BEFORE ISSUING A FINAL NEGATIVE REPORT     Performed at [facility]   Report Status PENDING   Incomplete  MRSA PCR SCREENING     Status: None   Collection Time    11/26/13  5:55 PM      Result Value Range Status   MRSA by PCR NEGATIVE  NEGATIVE Final   Comment:            The GeneXpert MRSA Assay (FDA     approved for NASAL specimens     only), is one component of a     comprehensive MRSA colonization     surveillance program. It is not     intended to diagnose MRSA     infection nor to guide or     monitor treatment for     MRSA infections.  CULTURE, BLOOD (ROUTINE X 2)     Status: None   Collection Time    11/26/13  6:27 PM      Result Value Range Status   Specimen Description BLOOD RIGHT HAND   Final   Special Requests BOTTLES DRAWN AEROBIC ONLY 5CC   Final   Culture  Setup Time     Final   Value: 11/26/2013 22:49     Performed at [facility]   Culture     Final   Value:        BLOOD CULTURE RECEIVED NO GROWTH TO DATE CULTURE WILL BE HELD FOR 5 DAYS BEFORE ISSUING A FINAL NEGATIVE REPORT     Performed at [facility]    Report Status PENDING   Incomplete     Studies: Dg Chest 2  View (if Patient Has Fever And/or Copd) 11/26/2013   IMPRESSION: Cardiomegaly. Possible pulmonary venous hypertension. No frank edema or focal pulmonary process.   Electronically Signed   By: Nelson Chimes M.D.   On: 11/26/2013 13:56    Scheduled Meds: . aspirin EC  81 mg Oral Daily  . azithromycin  500 mg Intravenous Q24H  . cefTRIAXone  1 g Intravenous Q24H  . glimepiride  2 mg Oral Q breakfast  . hydrALAZINE  10 mg Oral Q8H  . irbesartan  300 mg Oral Daily   And  . hydrochlorothiazide  12.5 mg Oral Daily  . insulin aspart  0-9 Units Subcutaneous Q4H  . ipratropium-albuterol  3 mL Nebulization QID  . metFORMIN  500 mg Oral BID WC  . methylPREDNISolone   60 mg Intravenous Q24H  . pantoprazole  40 mg Oral Daily  . theophylline  300 mg Oral Q12H

## 2013-11-28 NOTE — Progress Notes (Signed)
PT has agreed to be placed back on BiPAP- RN requested to complete medication administration prior to application. RT advised RN to call RT when PT has had meds.

## 2013-11-29 ENCOUNTER — Inpatient Hospital Stay (HOSPITAL_COMMUNITY): Payer: Medicare Other

## 2013-11-29 DIAGNOSIS — E872 Acidosis, unspecified: Secondary | ICD-10-CM

## 2013-11-29 DIAGNOSIS — R0609 Other forms of dyspnea: Secondary | ICD-10-CM

## 2013-11-29 DIAGNOSIS — R0989 Other specified symptoms and signs involving the circulatory and respiratory systems: Secondary | ICD-10-CM

## 2013-11-29 LAB — GLUCOSE, CAPILLARY
GLUCOSE-CAPILLARY: 164 mg/dL — AB (ref 70–99)
GLUCOSE-CAPILLARY: 219 mg/dL — AB (ref 70–99)
GLUCOSE-CAPILLARY: 97 mg/dL (ref 70–99)
Glucose-Capillary: 87 mg/dL (ref 70–99)
Glucose-Capillary: 103 mg/dL — ABNORMAL HIGH (ref 70–99)
Glucose-Capillary: 174 mg/dL — ABNORMAL HIGH (ref 70–99)

## 2013-11-29 LAB — LEGIONELLA ANTIGEN, URINE: Legionella Antigen, Urine: NEGATIVE

## 2013-11-29 MED ORDER — FAMOTIDINE 40 MG PO TABS
40.0000 mg | ORAL_TABLET | Freq: Every day | ORAL | Status: DC
  Administered 2013-11-29 – 2013-12-03 (×5): 40 mg via ORAL
  Filled 2013-11-29 (×6): qty 1

## 2013-11-29 MED ORDER — OXYMETAZOLINE HCL 0.05 % NA SOLN
2.0000 | Freq: Two times a day (BID) | NASAL | Status: AC
  Administered 2013-11-29 – 2013-12-01 (×5): 2 via NASAL
  Filled 2013-11-29: qty 15

## 2013-11-29 MED ORDER — SALINE SPRAY 0.65 % NA SOLN
2.0000 | Freq: Four times a day (QID) | NASAL | Status: DC
  Administered 2013-11-29 – 2013-12-04 (×19): 2 via NASAL
  Filled 2013-11-29: qty 44

## 2013-11-29 MED ORDER — FLUTICASONE PROPIONATE 50 MCG/ACT NA SUSP
2.0000 | Freq: Two times a day (BID) | NASAL | Status: DC
  Administered 2013-11-29 – 2013-12-04 (×11): 2 via NASAL
  Filled 2013-11-29: qty 16

## 2013-11-29 MED ORDER — PANTOPRAZOLE SODIUM 40 MG PO TBEC
40.0000 mg | DELAYED_RELEASE_TABLET | Freq: Two times a day (BID) | ORAL | Status: DC
  Administered 2013-11-29 – 2013-12-04 (×11): 40 mg via ORAL
  Filled 2013-11-29 (×13): qty 1

## 2013-11-29 NOTE — Progress Notes (Signed)
[facility] PROGRESS NOTE  [name] [id number] DOB: 1941-02-12 DOA: 11/26/2013 PCP: Leola Brazil, MD  Brief narrative: 73 year old female with past medical history of COPD, DM, hypertension GERD, glaucoma who presented to [facility] ED 11/26/2013 with complaints of worsening shortness of breath, subjective fevers and cough at home over past few days prior to this admission.  In ED, her O2 saturation was 98% but she was already on nasal canula for oxygen support. BP was 142/83, HR was 83 and Tmax was 99.9 F. Her CXR revealed cardiomegaly with possible pulmonary vascular congestion. She was given nebulizer treatment x 3 and solumedrol 125 mg IV one dose but continued to exhibit shortness of breath prompting admission to SDU. She requires intermittent BiPAP which she refuses ossacionally. I have confirmed partical code status with the patient's sister.   Assessment and Plan:   Principal Problem:  Acute exacerbation of chronic obstructive pulmonary disease (COPD)  - continue same nebulizer treatment every 2 hours PRN (albuterol and atrovent) and every 4 hours scheduled (duoneb)  - continue solumedrol 60 mg IV Q 24 hours  - oxygen support to keep O2 saturation above 90%; BiPAP if needed per respiratory therapist  - appreciate PCCM following - continue theophylline per home regimen  - antibiotics for pneumonia (azithromycin and rocephin)  Active Problems:  Community acquired pneumonia  - pneumonia order set in place  - continue azithromycin and rocephin  - flu and strep penumoniae - negative  - blood culture - no growth to date  DIABETES-TYPE 2  - continue glimepiride and metformin  - CBG's in past 24 hours: 97, 87 and 103 Iron deficiency anemia  - hemoglobin stable on the admission, 9.2  - no indications for transfusion  HYPERTENSION  - continue benicar per home dose  Hyponatremia - likely dehydration - sodium improved with initial IV fluids, 129 --> 131 GERD  -  continue protonix BID and pepcid at bedtime   Code Status: DNI only; this was confirmed in my discussion with the patient's sister at [phone] Family Communication: family not at the bedside  Disposition Plan: in ICU as she requires intermittent BiPAP  Consultants:  None  Procedures:  None  Antibiotics:  Azithromycin 11/26/2013 -->  Rocephin 11/16/2013 -->   Leisa Lenz, MD  [facility] Pager [phone]  If 7PM-7AM, please contact night-coverage [email or web address] Password [email or web address] 11/29/2013, 1:21 PM   LOS: 3 days    HPI/Subjective: Refused BiPAP. No respiratory distress this am.   Objective: Filed Vitals:   11/29/13 1126 11/29/13 1133 11/29/13 1140 11/29/13 1200  BP:    144/64  Pulse: 94 83 75 93  Temp:    98.2 F (36.8 C)  TempSrc:    Oral  Resp: 25 31 20 19   Height:      Weight:      SpO2: 80% 75% 94% 96%    Intake/Output Summary (Last 24 hours) at 11/29/13 1321 Last data filed at 11/29/13 1200  Gross per 24 hour  Intake    530 ml  Output   2000 ml  Net  -1470 ml    Exam:   General:  Pt is alert, follows commands appropriately, not in acute distress  Cardiovascular: Regular rate and rhythm, S1/S2 appreciated   Respiratory: expiratory wheezing, rhodochrous   Abdomen: Soft, non tender, non distended, bowel sounds present, no guarding  Extremities: No edema, pulses DP and PT palpable bilaterally  Neuro: Grossly nonfocal  Data Reviewed: Basic Metabolic Panel:  Recent Labs Lab  11/26/13 1300 11/26/13 1828 11/27/13 0355  NA 132* 129* 131*  K 4.2 4.4 4.8  CL 90* 89* 91*  CO2 30 25 29   GLUCOSE 333* 462* 415*  BUN 16 17 21   CREATININE 0.86 0.92 0.99  CALCIUM 9.5 9.7 9.3  MG  --  1.4*  --   PHOS  --  4.3  --    Liver Function Tests:  Recent Labs Lab 11/26/13 1828 11/27/13 0355  AST 28 32  ALT 21 23  ALKPHOS 102 97  BILITOT <0.2* <0.2*  PROT 7.2 6.8  ALBUMIN 3.4* 3.0*   No results found for this basename: LIPASE, AMYLASE,  in  the last 168 hours No results found for this basename: AMMONIA,  in the last 168 hours CBC:  Recent Labs Lab 11/26/13 1300 11/26/13 1828 11/27/13 0355  WBC 7.9 6.4 6.0  NEUTROABS  --  6.0  --   HGB 9.2* 9.4* 9.1*  HCT 31.2* 32.2* 31.8*  MCV 79.8 80.3 81.7  PLT 186 171 170   Cardiac Enzymes: No results found for this basename: CKTOTAL, CKMB, CKMBINDEX, TROPONINI,  in the last 168 hours BNP: No components found with this basename: POCBNP,  CBG:  Recent Labs Lab 11/28/13 1917 11/29/13 0023 11/29/13 0417 11/29/13 0759 11/29/13 1134  GLUCAP 263* 164* 97 87 103*    Recent Results (from the past 240 hour(s))  CULTURE, BLOOD (ROUTINE X 2)     Status: None   Collection Time    11/26/13  4:30 PM      Result Value Range Status   Specimen Description BLOOD BLOOD RIGHT FOREARM   Final   Value:        BLOOD CULTURE RECEIVED NO GROWTH TO DATE      Performed at [facility]   Report Status PENDING   Incomplete  MRSA PCR SCREENING     Status: None   Collection Time    11/26/13  5:55 PM      Result Value Range Status   MRSA by PCR NEGATIVE  NEGATIVE Final  CULTURE, BLOOD (ROUTINE X 2)     Status: None   Collection Time    11/26/13  6:27 PM      Result Value Range Status   Specimen Description BLOOD RIGHT HAND   Final   Value:        BLOOD CULTURE RECEIVED NO GROWTH TO DATE      Performed at [facility]   Report Status PENDING   Incomplete     Studies: Dg Chest Port 1 View 11/29/2013    IMPRESSION: Low lung volumes. Slight increased conspicuity of the interstitial markings may reflect underlying component of mild pulmonary edema. No focal regions of consolidation or focal infiltrates.   Electronically Signed   By: Margaree Mackintosh M.D.   On: 11/29/2013 11:16    Scheduled Meds: . aspirin EC  81 mg Oral Daily  . azithromycin  500 mg Intravenous Q24H  . cefTRIAXone  1 g Intravenous Q24H  . famotidine  40 mg Oral QHS  . glimepiride  2 mg Oral Q breakfast  .  hydrALAZINE  10 mg Oral Q8H  . irbesartan  300 mg Oral Daily  . hydrochlorothiazide  12.5 mg Oral Daily  . insulin aspart  0-9 Units Subcutaneous Q4H  . ipratropium-albuterol  3 mL Nebulization QID  . metFORMIN  500 mg Oral BID WC  . methylPREDNISolone   60 mg Intravenous Q24H  . oxymetazoline  2 spray  Each Nare BID  . pantoprazole  40 mg Oral BID AC  . theophylline  300 mg Oral Q12H

## 2013-11-29 NOTE — Progress Notes (Signed)
Patient LOC has decreased. Patient O2 Saturation still in low 80's. Patient is Full code. Patient placed on NRB. Patient pulled NRB off face. Patient then placed on Nasal cannula 6L. Mentation still decreased. Patient now has oxygen saturation of 100%. Will continue to monitor.

## 2013-11-29 NOTE — Progress Notes (Signed)
PT encouraged PT to use BiPAP to decrease WOB- PT denies at this time. RN aware.

## 2013-11-29 NOTE — Progress Notes (Signed)
RT educated PT on usage and importance of Flutter valve- PT is in pain at this time and unable to effectively demonstrate understanding. PT does however, verbally state she understands.

## 2013-11-29 NOTE — Progress Notes (Signed)
Nurse walked and Patient room and patient has NRB mask off face with oxygen saturation in mid 70's struggling to breath. Nurse tried to put NRB mask back on patient's face but patient refused. Pt stated, "I don't want that on my face." Nurse asked patient if she could try a nasal cannula or possible get back on BIPAP. Patient stated, "I know my rights and I don't want it on my face." Nurse assessed mentation and patient is alert and oriented x4. Nurse explained to patient importance of supplemental oxygen and with her diminished respiratory status without it patient could possible be intubated or die. Patient still refused oxygen devices. Patient is a full code. Charge nurse Pam aware of situation. MD Charlies Silvers paged about situation. Will continue to monitor.

## 2013-11-29 NOTE — Progress Notes (Addendum)
Nurse walked in patients room due to Patient currently desating mid 70's. Patient has oxygen [place] off face. Patient refusing to wear oxygen. Patient Alert and Oriented x4. Patient states, "I'm anxious, I want my xanax.' Patient knows risks of not wearing oxygen. NP Laurey Arrow (CCM) made aware of situation. Told to give 0.5 xanax. Charge nurse updated on situation. Will; continue to monitor.

## 2013-11-29 NOTE — Progress Notes (Signed)
Physical Therapy Treatment Patient Details Name: [name] MRN: [id number] DOB: 11/14/1941 Today's Date: 11/29/2013 Time: 7673-4193 PT Time Calculation (min): 26 min  PT Assessment / Plan / Recommendation  History of Present Illness 73 year old female with past medical history of COPD, DM, hypertension GERD, glaucoma who presented to [facility] ED 11/26/2013 with complaints of worsening shortness of breath and subjective fevers and cough at home over past few days prior to this admission. Pt used nebulizer treatments at home without significant symptomatic relief.    PT Comments   Pt limited by fatigue, dyspnea; has been refusing O2 today per RN but wearing it during PT session  Follow Up Recommendations  SNF;Supervision/Assistance - 24 hour     Does the patient have the potential to tolerate intense rehabilitation     Barriers to Discharge        Equipment Recommendations  None recommended by PT    Recommendations for Other Services    Frequency Min 3X/week   Progress towards PT Goals Progress towards PT goals: Progressing toward goals  Plan Current plan remains appropriate    Precautions / Restrictions Precautions Precautions: Fall Precaution Comments: monitor  sats.   Pertinent Vitals/Pain HR 82-88 Sats 89-94% 4L RR 18-27     Mobility  Bed Mobility Bed Mobility: Supine to Sit Supine to Sit: 3: Mod assist Details for Bed Mobility Assistance: requiring incr assist with trunk today Transfers Transfers: Sit to Stand;Stand to Sit Sit to Stand: 4: Min assist;3: Mod assist (incr assist with 3rd stand) Stand to Sit: 4: Min assist;3: Mod assist Stand Pivot Transfers: 4: Min assist;3: Mod assist Details for Transfer Assistance: requiring incr assist for balance and wt shift today as well as incr time for all movements;     Exercises     PT Diagnosis:    PT Problem List:   PT Treatment Interventions:     PT Goals (current goals can now be found in the care plan  section) Acute Rehab PT Goals Patient Stated Goal: i am sore Time For Goal Achievement: 12/11/13 Potential to Achieve Goals: Good  Visit Information  Last PT Received On: 11/29/13 Assistance Needed: +2 History of Present Illness: 73 year old female with past medical history of COPD, DM, hypertension GERD, glaucoma who presented to [facility] ED 11/26/2013 with complaints of worsening shortness of breath and subjective fevers and cough at home over past few days prior to this admission. Pt used nebulizer treatments at home without significant symptomatic relief.     Subjective Data  Patient Stated Goal: i am sore   Cognition  Cognition Arousal/Alertness: Awake/alert Behavior During Therapy: WFL for tasks assessed/performed Overall Cognitive Status: Within Functional Limits for tasks assessed    Balance  Static Sitting Balance Static Sitting - Balance Support: Bilateral upper extremity supported Static Sitting - Level of Assistance: 3: Mod assist;4: Min assist (LOB to right with assist and cues to recover)  End of Session PT - End of Session Equipment Utilized During Treatment: Gait belt Activity Tolerance: Patient limited by fatigue Patient left: in chair;with call bell/phone within reach Nurse Communication: Mobility status   GP     [facility] 11/29/2013, 3:41 PM

## 2013-11-29 NOTE — Progress Notes (Signed)
RT discussed with PT the need for BiPAP usage and benefits. PT states she feel good and would like to consider usage later. PT is currently oriented. RN aware.

## 2013-11-29 NOTE — Consult Note (Signed)
Name: [name] MRN: [id number] DOB: [birth year]    ADMISSION DATE:  11/26/2013 CONSULTATION DATE:  1/2  REFERRING MD :  Charlies Silvers  PRIMARY SERVICE:  triad  CHIEF COMPLAINT/reason for consult: AECOPD and acute respiratory failure   BRIEF PATIENT DESCRIPTION:  73 year old female admitted on 12/30 w/ AECOPD +/- CAP. PCCM asked to see on 1/2 for persistent hypoxia and further pulmonary recs.   SIGNIFICANT EVENTS / STUDIES:    LINES / TUBES:   CULTURES: BCX2 12/30>>> u strep 12/30: neg u legionella 12/30: >>> Influenza A&B 12/30: neg  ANTIBIOTICS: azithro 12/30>>> Rocephin 12/30>>>  HISTORY OF PRESENT ILLNESS:    73 year old female with past medical history of COPD, DM, hypertension GERD, glaucoma who presented to [facility] ED 11/26/2013 with complaints of worsening shortness of breath and subjective fevers and cough at home over past few days prior to this admission. Pt used nebulizer treatments at home without significant symptomatic relief. No chest pain, no palpitations. No abdominal pain, nausea or vomiting. No lightheadedness or loss of consciousness.  In ED, her O2 saturation was 98% but she was already on nasal canula for oxygen support. She was given nebulizer treatment x 3 and solumedrol 125 mg IV one dose but continue to exhibit shortness of breath prompting admission to SDU for possible need for BiPAP. Treatment was supportive and included:  Supplemental oxygen, systemic steroids and empiric antibiotics. Influenza PCR was negative, urine strep was negative. While in SDU pt had  Episodes of desaturation and refused NIPPV. PCCM asked to consult to make additional recs re: her respiratory care.   PAST MEDICAL HISTORY :  Past Medical History  Diagnosis Date  . COPD (chronic obstructive pulmonary disease)   . Hypertension   . Asthma   . Psoriatic arthritis   . Psoriasis   . GERD (gastroesophageal reflux disease)   . Hiatal hernia   . Chronic sinus infection   . Heart  murmur   . Blood transfusion ~ 1962    "11 units of blood"  . Ankylosing spondylitis   . Chronic back pain   . Dysrhythmia     "palpitations"  . Esophageal dilatation   . Candida esophagitis   . Cirrhosis of liver without mention of alcohol   . Hypercholesteremia   . Peripheral vascular disease   . Exertional dyspnea   . Pneumonia 10/07/11; 2006; 1980's  . Chronic bronchitis     "since childhood" (12/06/2012)  . Type II diabetes mellitus   . Iron deficiency anemia   . External bleeding hemorrhoids   . Sinus headache   . Arthritis     "psorasic"  . OA (osteoarthritis)   . DJD (degenerative joint disease) of knee     "both" (12/06/2012)  . Carpal tunnel syndrome on right   . Anxiety   . Barrett esophagus   . Non-healing ulcer of foot, secondary to diabetes and PVD 12/07/2012  . PVD (peripheral vascular disease) 12/07/2012  . Tobacco abuse 12/07/2012  . Critical lower limb ischemia, with ulcer Lt foot 12/07/2012  . Gout   . Psoriasis (a type of skin inflammation)   . COPD (chronic obstructive pulmonary disease)   . Carotid artery disease     dopplers showed mod L ICA stenosis   . PAD (peripheral artery disease)   . History of nuclear stress test 11/2012    lexiscan; normal, low risk   . Dermatophytosis of nail [id number]   Past Surgical History  Procedure Laterality Date  .  Nasal sinus surgery  09/29/2009  . Carpal tunnel release  2011    left hand  . Lipoma excision  2010    "left occipital area"  . Cataract extraction w/ intraocular lens implant  2009/2010    right/left  . Eye surgery    . Oophorectomy      'not sure which one"  . Appendectomy    . Angioplasty  12/10/2012    "LLE"; LSFA [place] orbital rotational arthrectomy, PTA , stenting with IDEV stent (left ABI improved from 0.35 to 0.75)  . Abdominal hysterectomy  1976  . Breast biopsy  1960's    "think he did both" (12/06/2012)  . 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 Authorizing Provider  albuterol (PROVENTIL HFA;VENTOLIN HFA) 108 (90 BASE) MCG/ACT inhaler Inhale 1 puff into the lungs every 6 (six) hours as needed. FOR SHORTNESS OF BREATH   Yes Historical Provider, MD  ALPRAZolam Duanne Moron) 0.5 MG tablet Take 0.5 mg by mouth 2 (two) times daily as needed for anxiety or sleep.   Yes Historical Provider, MD  ammonium lactate (LAC-HYDRIN) 12 % lotion Apply 1 application topically as needed for dry skin (psorias).   Yes Historical Provider, MD  aspirin EC 81 MG tablet Take 81 mg by mouth daily.   Yes Historical Provider, MD  Brinzolamide-Brimonidine [facility]) 1-0.2 % SUSP Apply 1 drop to eye 3 (three) times daily.   Yes Historical Provider, MD  Clobetasol Propionate (TEMOVATE) 0.05 % external spray Apply 1 spray topically 2 (two) times daily as needed. For psoriasis flare-ups   Yes Historical Provider, MD  esomeprazole (NEXIUM) 40 MG capsule Take 40 mg by mouth daily before breakfast.   Yes Historical Provider, MD  fluticasone (FLONASE) 50 MCG/ACT nasal spray Place 2 sprays into both nostrils daily.   Yes Historical Provider, MD  Fluticasone-Salmeterol (ADVAIR) 250-50 MCG/DOSE AEPB Inhale 1 puff into the lungs every 12 (twelve) hours.     Yes Historical Provider, MD  glimepiride (AMARYL) 2 MG tablet Take 2 mg by mouth daily with breakfast.   Yes Historical Provider, MD  ipratropium-albuterol (DUONEB) 0.5-2.5 (3) MG/3ML SOLN Take 3 mLs by nebulization every 4 (four) hours as needed (shortness of breath).   Yes Historical Provider, MD  metFORMIN (GLUCOPHAGE) 500 MG tablet Take 500 mg by mouth 2 (two) times daily with a meal.   Yes Historical Provider, MD  Multiple Vitamins-Minerals (EYE VITAMINS) CAPS Take 1 capsule by mouth daily.   Yes Historical Provider, MD    olmesartan-hydrochlorothiazide (BENICAR HCT) 40-12.5 MG per tablet Take 1 tablet by mouth daily.   Yes Historical Provider, MD  predniSONE (DELTASONE) 5 MG tablet Take 5 mg by mouth daily. On Monday, Wednesday, Friday    Yes Historical Provider, MD  Probiotic Product (ALIGN) 4 MG CAPS Take 4 mg by mouth daily.   Yes Historical Provider, MD  ranitidine (ZANTAC) 150 MG capsule Take 150 mg by mouth at bedtime.     Yes Historical Provider, MD  theophylline (THEODUR) 200 MG 12 hr tablet Take 300 mg by mouth 2 (two) times daily.    Yes Historical Provider, MD  Travoprost, BAK Free, (TRAVATAMN) 0.004 % SOLN ophthalmic solution Place 1 drop into both eyes at bedtime.    Yes Historical Provider, MD   Allergies  Allergen Reactions  .  Amlodipine Besy-Benazepril Hcl Other (See Comments)    Nervous/shakiness  . Statins Other (See Comments)    "can't take any of them; cramp me up" (12/06/2012)  . Plavix [Clopidogrel] Rash    Pt states that the doctor told her that her rash was psoriasis and not related to plavix but she states the rash came from the plavix  . Raptiva [Efalizumab] Rash    FAMILY HISTORY:  Family History  Problem Relation Age of Onset  . Lupus Sister     x2  . Lung cancer Sister   . Thyroid disease Sister   . Colon cancer Neg Hx   . Cancer Mother   . Heart Problems Maternal Grandmother   . Cancer Paternal Grandfather   . Diabetes Brother     x2  . Arthritis Brother     x3  . Arthritis Sister     x3   SOCIAL HISTORY:  reports that she has been smoking Cigarettes.  She has a 40 pack-year smoking history. She has never used smokeless tobacco. She reports that she does not drink alcohol or use illicit drugs.  Review of Systems:   Bolds are positive  Constitutional: weight loss, gain, night sweats, Fevers, chills, fatigue .  HEENT: headaches, nasal congestion, Sore throat, sneezing,  post nasal drip, Difficulty swallowing, Tooth/dental problems, visual complaints visual changes,  ear ache CV:  chest pain, radiates: ,Orthopnea, PND, swelling in lower extremities, dizziness, palpitations, syncope.  GI  heartburn, indigestion, abdominal pain, nausea, vomiting, diarrhea, change in bowel habits, loss of appetite, bloody stools.  Resp: cough, productive: , hemoptysis, dyspnea, w/ difficulty completing full sentence, chest pain, pleuritic.  Skin: rash or itching or icterus GU: dysuria, change in color of urine, urgency or frequency. flank pain, hematuria  MS: joint pain or swelling. decreased range of motion  Psych: change in mood or affect. depression or anxiety.  Neuro: difficulty with speech, weakness, numbness, ataxia   SUBJECTIVE:   VITAL SIGNS: Temp:  [97.4 F (36.3 C)-98.6 F (37 C)] 98.2 F (36.8 C) (01/02 0800) Pulse Rate:  [50-95] 75 (01/02 0800) Resp:  [14-33] 22 (01/02 0800) BP: (79-182)/(36-74) 173/61 mmHg (01/02 0800) SpO2:  [70 %-100 %] 92 % (01/02 0945) FiO2 (%):  [40 %] 40 % (01/02 0200) Weight:  [82.3 kg (181 lb 7 oz)] 82.3 kg (181 lb 7 oz) (01/02 0156)  PHYSICAL EXAMINATION: General:  Chronically ill appearing aaf, w/ difficulty completing full sentence  Neuro:  Awake, alert, no focal def  HEENT:  Marked nasal congestion, voice hoarse, marked upper airway wheeze Cardiovascular:  rrr Lungs:  + pseudo wheeze, crackles Right lowe lobe  Abdomen:  Soft, non-tender  Musculoskeletal:  Intact  Skin:  Intact    Recent Labs Lab 11/26/13 1300 11/26/13 1828 11/27/13 0355  NA 132* 129* 131*  K 4.2 4.4 4.8  CL 90* 89* 91*  CO2 30 25 29   BUN 16 17 21   CREATININE 0.86 0.92 0.99  GLUCOSE 333* 462* 415*    Recent Labs Lab 11/26/13 1300 11/26/13 1828 11/27/13 0355  HGB 9.2* 9.4* 9.1*  HCT 31.2* 32.2* 31.8*  WBC 7.9 6.4 6.0  PLT 186 171 170   No results found. RLL airspace disease  ASSESSMENT / PLAN:  Acute on Chronic respiratory failure  in the setting of AECOPD and probable CAP.  Acute on chronic sinusitis  prob element of VCD in  setting of chronic sinusitis and reflux  plan -f/u cxr -aggressive pulm hygiene  -cont current systemic  steroids -nasal hygiene regimen  -agree w/ current abx -escalate PPI and add nocturnal H2 blocker  -viral panel -consider stopping ARB  FeO4 def anemia, DM type II and HTN  Plan Per primary service   Rush Farmer, M.D. [facility] Pulmonary/Critical Care Medicine. Pager: [phone]. After hours pager: [phone].

## 2013-11-30 DIAGNOSIS — D509 Iron deficiency anemia, unspecified: Secondary | ICD-10-CM

## 2013-11-30 DIAGNOSIS — I10 Essential (primary) hypertension: Secondary | ICD-10-CM

## 2013-11-30 DIAGNOSIS — J441 Chronic obstructive pulmonary disease with (acute) exacerbation: Secondary | ICD-10-CM

## 2013-11-30 DIAGNOSIS — K2270 Barrett's esophagus without dysplasia: Secondary | ICD-10-CM

## 2013-11-30 LAB — CBC
HCT: 32.8 % — ABNORMAL LOW (ref 36.0–46.0)
Hemoglobin: 9.5 g/dL — ABNORMAL LOW (ref 12.0–15.0)
MCH: 23.8 pg — ABNORMAL LOW (ref 26.0–34.0)
MCHC: 29 g/dL — ABNORMAL LOW (ref 30.0–36.0)
MCV: 82.2 fL (ref 78.0–100.0)
PLATELETS: 188 10*3/uL (ref 150–400)
RBC: 3.99 MIL/uL (ref 3.87–5.11)
RDW: 19.1 % — AB (ref 11.5–15.5)
WBC: 9.1 10*3/uL (ref 4.0–10.5)

## 2013-11-30 LAB — BASIC METABOLIC PANEL
BUN: 23 mg/dL (ref 6–23)
CHLORIDE: 91 meq/L — AB (ref 96–112)
CO2: 34 mEq/L — ABNORMAL HIGH (ref 19–32)
CREATININE: 0.89 mg/dL (ref 0.50–1.10)
Calcium: 9.8 mg/dL (ref 8.4–10.5)
GFR, EST AFRICAN AMERICAN: 73 mL/min — AB (ref >90)
GFR, EST NON AFRICAN AMERICAN: 63 mL/min — AB (ref >90)
Glucose, Bld: 180 mg/dL — ABNORMAL HIGH (ref 70–99)
POTASSIUM: 4.4 meq/L (ref 3.7–5.3)
Sodium: 135 mEq/L — ABNORMAL LOW (ref 137–147)

## 2013-11-30 LAB — GLUCOSE, CAPILLARY
GLUCOSE-CAPILLARY: 91 mg/dL (ref 70–99)
GLUCOSE-CAPILLARY: 181 mg/dL — AB (ref 70–99)
GLUCOSE-CAPILLARY: 277 mg/dL — AB (ref 70–99)
Glucose-Capillary: 105 mg/dL — ABNORMAL HIGH (ref 70–99)

## 2013-11-30 MED ORDER — DEXTROSE 5 % IV SOLN
500.0000 mg | INTRAVENOUS | Status: AC
  Administered 2013-11-30 – 2013-12-02 (×3): 500 mg via INTRAVENOUS
  Filled 2013-11-30 (×3): qty 500

## 2013-11-30 MED ORDER — ALBUTEROL SULFATE (2.5 MG/3ML) 0.083% IN NEBU: 2.5000 mg | INHALATION_SOLUTION | RESPIRATORY_TRACT | Status: DC | PRN

## 2013-11-30 MED ORDER — ARFORMOTEROL TARTRATE 15 MCG/2ML IN NEBU
15.0000 ug | INHALATION_SOLUTION | Freq: Two times a day (BID) | RESPIRATORY_TRACT | Status: DC
  Administered 2013-11-30 – 2013-12-04 (×7): 15 ug via RESPIRATORY_TRACT
  Filled 2013-11-30 (×12): qty 2

## 2013-11-30 MED ORDER — AZITHROMYCIN 500 MG PO TABS
500.0000 mg | ORAL_TABLET | Freq: Every day | ORAL | Status: DC
  Filled 2013-11-30: qty 1

## 2013-11-30 MED ORDER — BUDESONIDE 0.5 MG/2ML IN SUSP
0.5000 mg | Freq: Two times a day (BID) | RESPIRATORY_TRACT | Status: DC
  Administered 2013-11-30 – 2013-12-04 (×7): 0.5 mg via RESPIRATORY_TRACT
  Filled 2013-11-30 (×16): qty 2

## 2013-11-30 MED ORDER — TIOTROPIUM BROMIDE MONOHYDRATE 18 MCG IN CAPS
18.0000 ug | ORAL_CAPSULE | Freq: Every day | RESPIRATORY_TRACT | Status: DC
  Administered 2013-11-30 – 2013-12-04 (×5): 18 ug via RESPIRATORY_TRACT
  Filled 2013-11-30 (×2): qty 5

## 2013-11-30 MED ORDER — METHYLPREDNISOLONE SODIUM SUCC 40 MG IJ SOLR
40.0000 mg | Freq: Three times a day (TID) | INTRAMUSCULAR | Status: DC
  Administered 2013-11-30 – 2013-12-03 (×9): 40 mg via INTRAVENOUS
  Filled 2013-11-30 (×11): qty 1

## 2013-11-30 MED ORDER — FUROSEMIDE 10 MG/ML IJ SOLN
40.0000 mg | Freq: Once | INTRAMUSCULAR | Status: AC
  Administered 2013-11-30: 40 mg via INTRAVENOUS
  Filled 2013-11-30: qty 4

## 2013-11-30 NOTE — Consult Note (Signed)
Name: [name] MRN: [id number] DOB: 02/22/1941    ADMISSION DATE:  11/26/2013 CONSULTATION DATE:  11/29/2013  REFERRING MD :  Charlies Silvers   CHIEF COMPLAINT: short of breath  BRIEF PATIENT DESCRIPTION:  73 yo female smoker with progressive dyspnea, cough, sputum and wheeze from AECOPD and possible PNA.  PCCM consult to assess hypoxic/hypercapnic respiratory failure.  SIGNIFICANT EVENTS: 12/30 Admit, temporarily needed BiPAP 01/02 Worsening hypoxia, decreased level of consciousness, PCCM consulted  STUDIES:   LINES / TUBES: PIV  CULTURES: BCX2 12/30>>> u strep 12/30>>>negative u legionella 12/30:>>>negative Influenza A&B 12/30>>> negative Respiratory viral panel 1/02>>>  ANTIBIOTICS: azithromycin 12/30>>> Rocephin 12/30>>>  SUBJECTIVE:  Placed back on BiPAP this AM.  C/o chest tightness, wheeze, and cough.  VITAL SIGNS: Temp:  [97.8 F (36.6 C)-98.4 F (36.9 C)] 98.3 F (36.8 C) (01/03 0800) Pulse Rate:  [25-117] 75 (01/03 0937) Resp:  [13-34] 19 (01/03 0937) BP: (99-202)/(42-107) 164/96 mmHg (01/03 0800) SpO2:  [66 %-100 %] 94 % (01/03 0937) Weight:  [177 lb 4 oz (80.4 kg)] 177 lb 4 oz (80.4 kg) (01/03 0400)  PHYSICAL EXAMINATION: General: ill appearing Neuro:  Awake, alert, follows commands HEENT: BiPAP mask in place Cardiovascular: regular Lungs: b/l expiratory wheezing Abdomen:  Soft, non tender Musculoskeletal: 1+ ankle edema Skin: no rashes  Labs: CBC Recent Labs     11/30/13  1004  WBC  9.1  HGB  9.5*  HCT  32.8*  PLT  188   BMET Recent Labs     11/30/13  1004  NA  135*  K  4.4  CL  91*  CO2  34*  BUN  23  CREATININE  0.89  GLUCOSE  180*    Electrolytes Recent Labs     11/30/13  1004  CALCIUM  9.8   ABG Recent Labs     11/28/13  1956  PHART  7.228*  PCO2ART  77.5*  PO2ART  77.6*   Glucose Recent Labs     11/29/13  0417  11/29/13  0759  11/29/13  1134  11/29/13  1555  11/29/13  1953  11/30/13  0005  GLUCAP   97  87  103*  174*  219*  105*    Imaging Dg Chest Port 1 View  11/29/2013   CLINICAL DATA:  Cough  EXAM: PORTABLE CHEST - 1 VIEW  COMPARISON:  11/26/2013  FINDINGS: Low lung volumes. Cardiac silhouette is enlarged. No focal regions of consolidation no focal infiltrates. There is slight increased prominence of interstitial markings when compared to the previous study. Degenerative changes identified within left shoulder.  IMPRESSION: Low lung volumes. Slight increased conspicuity of the interstitial markings may reflect underlying component of mild pulmonary edema. No focal regions of consolidation or focal infiltrates.   Electronically Signed   By: Margaree Mackintosh M.D.   On: 11/29/2013 11:16    ASSESSMENT / PLAN:  A: Acute hypoxic/hypercapnic respiratory failure from AECOPD, and probable PNA. P: -continue rocephin, zithromax -f/u CXR 1/04 -oxygen to keep SpO2 > 90% -BiPAP prn -change solumedrol to 40 mg q8h -continue BD's >> change to brovana, pulmicort, spiriva and prn albuterol 1/03  A: Acute on chronic sinusitis. P: -limit use of afrin to 2 days total -continue flonase, saline nasal spray  A: GERD as possible contributor to cough. P: -continue pepcid, bid protonix -d/c theophylline 1/03  A: HTN with probable acute diastolic heart failure with pulmonary edema. P: -per primary team -negative fluid balance as tolerated >> lasix 40 mg  x one on 1/03  A: Anxiety. P: -try to limit benzo's, narcotics as tolerated in setting of respiratory failure  Chesley Mires, MD [place] 11/30/2013, 11:24 AM Pager:  [phone] After 3pm call: [phone]

## 2013-11-30 NOTE — Progress Notes (Signed)
Clinical Social Work Department BRIEF PSYCHOSOCIAL ASSESSMENT 11/30/2013  Patient:  [name], [name]     Account Number:  [id number]     Admit date:  11/26/2013  Clinical Social Worker:  Levie Heritage  Date/Time:  11/30/2013 02:59 PM  Referred by:  Physician  Date Referred:  11/30/2013 Referred for  SNF Placement   Other Referral:   Interview type:  Patient Other interview type:    PSYCHOSOCIAL DATA Living Status:  FAMILY Admitted from facility:   Level of care:   Primary support name:  Daniel Nones Primary support relationship to patient:  SIBLING Degree of support available:   strong    CURRENT CONCERNS Current Concerns  Post-Acute Placement   Other Concerns:    SOCIAL WORK ASSESSMENT / PLAN Met with Pt to discuss d/c plans.    Pt very uncomfortable and had trouble making decisions.    Pt stated that her sister lives with her but that she works during the day.  Pt, at first, didn't see the need to have someone be with her while her sister is gone but, after discussing Pt's deconditioning, she agreed to allow CSW to do a SNF search.    Pt has been to [facility], by hx, and stated that, although she doesn't want to go to SNF, she would be ok with going back there.    CSW gave Pt a SNF list.    CSW thanked Pt for her time.   Assessment/plan status:  Psychosocial Support/Ongoing Assessment of Needs Other assessment/ plan:   Information/referral to community resources:   SNF list    PATIENT'S/FAMILY'S RESPONSE TO PLAN OF CARE: Pt was uncomfortable throughout the assessment, with shifting constantly and placing her head in her hands.    She really doesn't want to go to SNF but understands that she's not able to care for herself, at this time.    Pt ultimately agreed to a SNF search, although she wasn't happy with the idea.    Pt was cooperative and thanked CSW for time and assistance.   Bernita Raisin, [place] Work [phone]

## 2013-11-30 NOTE — Progress Notes (Deleted)
Clinical Social Work Department CLINICAL SOCIAL WORK PLACEMENT NOTE 11/30/2013  Patient:  Hilbert Bible  Account Number:  [id number] Admit date:  11/23/2013  Clinical Social Worker:  Levie Heritage  Date/time:  11/30/2013 03:05 PM  Clinical Social Work is seeking post-discharge placement for this patient at the following level of care:   [place]   (*CSW will update this form in [place] as items are completed)   11/30/2013  Patient/family provided with [place] Department of Clinical Social Work's list of facilities offering this level of care within the geographic area requested by the patient (or if unable, by the patient's family).  11/30/2013  Patient/family informed of their freedom to choose among providers that offer the needed level of care, that participate in Medicare, Medicaid or managed care program needed by the patient, have an available bed and are willing to accept the patient.  11/30/2013  Patient/family informed of [facility]' ownership interest in [facility], as well as of the fact that they are under no obligation to receive care at this facility.  PASARR submitted to EDS on existing PASARR number received from EDS on   FL2 transmitted to all facilities in geographic area requested by pt/family on  11/30/2013 FL2 transmitted to all facilities within larger geographic area on   Patient informed that his/her managed care company has contracts with or will negotiate with  certain facilities, including the following:     Patient/family informed of bed offers received:   Patient chooses bed at  Physician recommends and patient chooses bed at    Patient to be transferred to  on   Patient to be transferred to facility by   The following physician request were entered in Epic:   Additional Comments:  Bernita Raisin, [place] Work [phone]

## 2013-11-30 NOTE — Progress Notes (Signed)
Patient refuses BIPAP at this time--RT will assist as needed. RN aware

## 2013-11-30 NOTE — Progress Notes (Addendum)
Clinical Social Work Department CLINICAL SOCIAL WORK PLACEMENT NOTE 11/30/2013  Patient:  [name], [name]  Account Number:  [id number] Admit date:  11/26/2013  Clinical Social Worker:  Levie Heritage  Date/time:  11/30/2013 03:03 PM  Clinical Social Work is seeking post-discharge placement for this patient at the following level of care:   SKILLED NURSING   (*CSW will update this form in Epic as items are completed)   11/30/2013  Patient/family provided with [place] Department of Clinical Social Work's list of facilities offering this level of care within the geographic area requested by the patient (or if unable, by the patient's family).  11/30/2013  Patient/family informed of their freedom to choose among providers that offer the needed level of care, that participate in Medicare, Medicaid or managed care program needed by the patient, have an available bed and are willing to accept the patient.  11/30/2013  Patient/family informed of [facility]' ownership interest in [facility], as well as of the fact that they are under no obligation to receive care at this facility.  PASARR submitted to EDS on existing PASARR number received from EDS on   FL2 transmitted to all facilities in geographic area requested by pt/family on  11/30/2013 FL2 transmitted to all facilities within larger geographic area on   Patient informed that his/her managed care company has contracts with or will negotiate with  certain facilities, including the following:     Patient/family informed of bed offers received:  12/04/13 Patient chooses bed at  Physician recommends and patient chooses bed at    Patient to be transferred to  on   Patient to be transferred to facility by   The following physician request were entered in Epic:   Additional Comments:  12/04/13-patient decided to return home with [facility] services.

## 2013-11-30 NOTE — Progress Notes (Signed)
[facility] PROGRESS NOTE  [name] UTM:[id number] DOB: [birth year] DOA: 11/26/2013 PCP: Leola Brazil, MD  Brief narrative: 73 year old female with past medical history of COPD, DM, hypertension GERD, glaucoma who presented to [facility] ED 11/26/2013 with complaints of worsening shortness of breath, subjective fevers and cough at home over past few days prior to this admission.  In ED, her O2 saturation was 98% but she was already on nasal canula for oxygen support. BP was 142/83, HR was 83 and Tmax was 99.9 F. Her CXR revealed cardiomegaly with possible pulmonary vascular congestion. She was given nebulizer treatment x 3 and solumedrol 125 mg IV one dose but continued to exhibit shortness of breath prompting admission to SDU. She requires intermittent BiPAP which she refuses ossacionally. I have confirmed partical code status with the patient's sister.   Assessment and Plan:   Principal Problem:  Acute exacerbation of chronic obstructive pulmonary disease (COPD)  - continue BD's >> change to brovana, pulmicort, spiriva and prn albuterol 1/03 - change solumedrol to 40 mg q8h - oxygen support to keep O2 saturation above 90%; BiPAP if needed per respiratory therapist  - appreciate PCCM following - continue theophylline per home regimen  - antibiotics for pneumonia (azithromycin and rocephin)  - lasix 20mg  IV given OTO this AM b/c of congestion on CXR   Active Problems:  Community acquired pneumonia  - pneumonia order set in place  - continue azithromycin and rocephin  - flu and strep penumoniae - negative  - blood culture - no growth to date  DIABETES-TYPE 2  - continue glimepiride and metformin  - CBG's in past 24 hours: 97, 87 and 103 Iron deficiency anemia  - hemoglobin stable on the admission, 9.2  - no indications for transfusion  HYPERTENSION  - continue benicar per home dose  Hyponatremia - likely dehydration - sodium improved with initial IV fluids, 129 -->  131-->135 GERD  - continue protonix BID and pepcid at bedtime   Code Status: DNI only; this was confirmed in my discussion with the patient's sister at [phone] Family Communication: family not at the bedside  Disposition Plan: in ICU as she requires intermittent BiPAP  Consultants:  None  Procedures:  None  Antibiotics:  Azithromycin 11/26/2013 -->  Rocephin 11/16/2013 -->   Velna Hatchet, MD  [facility] Pager [phone]  If 7PM-7AM, please contact night-coverage [email or web address] Password [email or web address] 11/30/2013, 12:11 PM   LOS: 4 days    HPI/Subjective: Accepted BiPAP this AM 2/2 dyspnea. S/p lasix x 1. Foley placed   Objective: Filed Vitals:   11/30/13 0832 11/30/13 0937 11/30/13 1144 11/30/13 1200  BP:      Pulse:  75 76   Temp:    98.6 F (37 C)  TempSrc:    Oral  Resp:  19 18   Height:      Weight:      SpO2: 98% 94% 98%     Intake/Output Summary (Last 24 hours) at 11/30/13 1211 Last data filed at 11/30/13 1000  Gross per 24 hour  Intake    643 ml  Output    650 ml  Net     -7 ml    Exam:   General:  Pt is alert, follows commands appropriately, not in acute distress; on BiPAP  Cardiovascular: Regular rate and rhythm, S1/S2 appreciated   Respiratory: expiratory wheezing, rhodochrous   Abdomen: Soft, non tender, non distended, bowel sounds present, no guarding  Extremities: No edema, pulses DP and  PT palpable bilaterally  Neuro: Grossly nonfocal  Data Reviewed: Basic Metabolic Panel:  Recent Labs Lab 11/26/13 1300 11/26/13 1828 11/27/13 0355 11/30/13 1004  NA 132* 129* 131* 135*  K 4.2 4.4 4.8 4.4  CL 90* 89* 91* 91*  CO2 30 25 29  34*  GLUCOSE 333* 462* 415* 180*  BUN 16 17 21 23   CREATININE 0.86 0.92 0.99 0.89  CALCIUM 9.5 9.7 9.3 9.8  MG  --  1.4*  --   --   PHOS  --  4.3  --   --    Liver Function Tests:  Recent Labs Lab 11/26/13 1828 11/27/13 0355  AST 28 32  ALT 21 23  ALKPHOS 102 97  BILITOT <0.2* <0.2*  PROT  7.2 6.8  ALBUMIN 3.4* 3.0*   No results found for this basename: LIPASE, AMYLASE,  in the last 168 hours No results found for this basename: AMMONIA,  in the last 168 hours CBC:  Recent Labs Lab 11/26/13 1300 11/26/13 1828 11/27/13 0355 11/30/13 1004  WBC 7.9 6.4 6.0 9.1  NEUTROABS  --  6.0  --   --   HGB 9.2* 9.4* 9.1* 9.5*  HCT 31.2* 32.2* 31.8* 32.8*  MCV 79.8 80.3 81.7 82.2  PLT 186 171 170 188   Cardiac Enzymes: No results found for this basename: CKTOTAL, CKMB, CKMBINDEX, TROPONINI,  in the last 168 hours BNP: No components found with this basename: POCBNP,  CBG:  Recent Labs Lab 11/29/13 1555 11/29/13 1953 11/30/13 0005 11/30/13 0756 11/30/13 1145  GLUCAP 174* 219* 105* 91 181*    Recent Results (from the past 240 hour(s))  CULTURE, BLOOD (ROUTINE X 2)     Status: None   Collection Time    11/26/13  4:30 PM      Result Value Range Status   Specimen Description BLOOD BLOOD RIGHT FOREARM   Final   Value:        BLOOD CULTURE RECEIVED NO GROWTH TO DATE      Performed at [facility]   Report Status PENDING   Incomplete  MRSA PCR SCREENING     Status: None   Collection Time    11/26/13  5:55 PM      Result Value Range Status   MRSA by PCR NEGATIVE  NEGATIVE Final  CULTURE, BLOOD (ROUTINE X 2)     Status: None   Collection Time    11/26/13  6:27 PM      Result Value Range Status   Specimen Description BLOOD RIGHT HAND   Final   Value:        BLOOD CULTURE RECEIVED NO GROWTH TO DATE      Performed at [facility]   Report Status PENDING   Incomplete     Studies: Dg Chest Port 1 View 11/29/2013    IMPRESSION: Low lung volumes. Slight increased conspicuity of the interstitial markings may reflect underlying component of mild pulmonary edema. No focal regions of consolidation or focal infiltrates.   Electronically Signed   By: Margaree Mackintosh M.D.   On: 11/29/2013 11:16    Scheduled Meds: . aspirin EC  81 mg Oral Daily  . azithromycin  500  mg Intravenous Q24H  . cefTRIAXone  1 g Intravenous Q24H  . famotidine  40 mg Oral QHS  . glimepiride  2 mg Oral Q breakfast  . hydrALAZINE  10 mg Oral Q8H  . irbesartan  300 mg Oral Daily  . hydrochlorothiazide  12.5 mg  Oral Daily  . insulin aspart  0-9 Units Subcutaneous Q4H  . ipratropium-albuterol  3 mL Nebulization QID  . metFORMIN  500 mg Oral BID WC  . methylPREDNISolone   60 mg Intravenous Q24H  . oxymetazoline  2 spray Each Nare BID  . pantoprazole  40 mg Oral BID AC  . theophylline  300 mg Oral Q12H

## 2013-11-30 NOTE — Progress Notes (Deleted)
PHARMACIST - PHYSICIAN COMMUNICATION DR:   TR[facility] CONCERNING: Antibiotic IV to Oral Route Change Policy  RECOMMENDATION: This patient is receiving azithromycin by the intravenous route.  Based on criteria approved by the Pharmacy and Therapeutics Committee, the antibiotic(s) is/are being converted to the equivalent oral dose form(s).   DESCRIPTION: These criteria include:  Patient being treated for a respiratory tract infection, urinary tract infection, cellulitis or clostridium difficile associated diarrhea if on metronidazole  The patient is not neutropenic and does not exhibit a GI malabsorption state  The patient is eating (either orally or via tube) and/or has been taking other orally administered medications for a least 24 hours  The patient is improving clinically and has a Tmax < 100.5  If you have questions about this conversion, please contact the Pharmacy Department  []   [phone] )  [facility] []   [phone] )  [facility]  []   [phone] )  [facility] [x]   [phone] )  [facility]    Stop date of 1/5 included as per previous order for total 7 days therapy.   Doreene Eland, PharmD, BCPS.   Pager: [phone] 11/30/2013 10:02 AM

## 2013-11-30 NOTE — Progress Notes (Signed)
At 2055, pt seen and found on 2lnc.  HHN given as per md rx.  Pt noted for crackles and exp wheezes.  HR77, rr20, sats100%.  No respiratory distress or increased wob noted at this time.  Bipap remains in room on standby.  Rn notified.

## 2013-12-01 ENCOUNTER — Inpatient Hospital Stay (HOSPITAL_COMMUNITY): Payer: Medicare Other

## 2013-12-01 DIAGNOSIS — E8779 Other fluid overload: Secondary | ICD-10-CM

## 2013-12-01 LAB — CBC
HEMATOCRIT: 33.4 % — AB (ref 36.0–46.0)
Hemoglobin: 9.6 g/dL — ABNORMAL LOW (ref 12.0–15.0)
MCH: 23.4 pg — AB (ref 26.0–34.0)
MCHC: 28.7 g/dL — ABNORMAL LOW (ref 30.0–36.0)
MCV: 81.5 fL (ref 78.0–100.0)
PLATELETS: 182 10*3/uL (ref 150–400)
RBC: 4.1 MIL/uL (ref 3.87–5.11)
RDW: 19.1 % — AB (ref 11.5–15.5)
WBC: 6 10*3/uL (ref 4.0–10.5)

## 2013-12-01 LAB — GLUCOSE, CAPILLARY
GLUCOSE-CAPILLARY: 177 mg/dL — AB (ref 70–99)
GLUCOSE-CAPILLARY: 155 mg/dL — AB (ref 70–99)
GLUCOSE-CAPILLARY: 143 mg/dL — AB (ref 70–99)
Glucose-Capillary: 73 mg/dL (ref 70–99)
Glucose-Capillary: 305 mg/dL — ABNORMAL HIGH (ref 70–99)
Glucose-Capillary: 199 mg/dL — ABNORMAL HIGH (ref 70–99)
Glucose-Capillary: 183 mg/dL — ABNORMAL HIGH (ref 70–99)
Glucose-Capillary: 109 mg/dL — ABNORMAL HIGH (ref 70–99)

## 2013-12-01 LAB — BASIC METABOLIC PANEL
BUN: 27 mg/dL — ABNORMAL HIGH (ref 6–23)
CALCIUM: 9.7 mg/dL (ref 8.4–10.5)
CO2: 32 meq/L (ref 19–32)
CREATININE: 0.97 mg/dL (ref 0.50–1.10)
Chloride: 92 mEq/L — ABNORMAL LOW (ref 96–112)
GFR calc Af Amer: 66 mL/min — ABNORMAL LOW (ref >90)
GFR calc non Af Amer: 57 mL/min — ABNORMAL LOW (ref >90)
Glucose, Bld: 166 mg/dL — ABNORMAL HIGH (ref 70–99)
Potassium: 4.9 mEq/L (ref 3.7–5.3)
Sodium: 134 mEq/L — ABNORMAL LOW (ref 137–147)

## 2013-12-01 MED ORDER — FUROSEMIDE 10 MG/ML IJ SOLN
40.0000 mg | Freq: Once | INTRAMUSCULAR | Status: AC
  Administered 2013-12-01: 40 mg via INTRAVENOUS
  Filled 2013-12-01: qty 4

## 2013-12-01 MED ORDER — IRBESARTAN 300 MG PO TABS
300.0000 mg | ORAL_TABLET | Freq: Every day | ORAL | Status: DC
  Administered 2013-12-02 – 2013-12-04 (×3): 300 mg via ORAL
  Filled 2013-12-01 (×3): qty 1

## 2013-12-01 MED ORDER — HYDROCHLOROTHIAZIDE 25 MG PO TABS
25.0000 mg | ORAL_TABLET | Freq: Every day | ORAL | Status: DC
  Administered 2013-12-02 – 2013-12-04 (×3): 25 mg via ORAL
  Filled 2013-12-01 (×3): qty 1

## 2013-12-01 NOTE — Progress Notes (Signed)
Name: [name] MRN: [id number] DOB: 03-24-1941    ADMISSION DATE:  11/26/2013 CONSULTATION DATE:  11/29/2013  REFERRING MD :  Charlies Silvers   CHIEF COMPLAINT: short of breath  BRIEF PATIENT DESCRIPTION:  73 yo female smoker with progressive dyspnea, cough, sputum and wheeze from AECOPD and possible PNA.  PCCM consult to assess hypoxic/hypercapnic respiratory failure.  SIGNIFICANT EVENTS: 12/30 Admit, temporarily needed BiPAP 01/02 Worsening hypoxia, decreased level of consciousness, PCCM consulted  STUDIES:   LINES / TUBES: PIV  CULTURES: BCX2 12/30>>> u strep 12/30>>>negative u legionella 12/30:>>>negative Influenza A&B 12/30>>> negative Respiratory viral panel 1/02>>>  ANTIBIOTICS: azithromycin 12/30>>> Rocephin 12/30>>>  SUBJECTIVE:  Did not need BiPAP since yesterday afternoon.  Breathing much better.  Still has cough and chest congestion.  Feels reflux much better compared to yesterday.  VITAL SIGNS: Temp:  [97.5 F (36.4 C)-98.8 F (37.1 C)] 98.2 F (36.8 C) (01/04 0400) Pulse Rate:  [57-87] 74 (01/04 0400) Resp:  [15-26] 26 (01/04 0400) BP: (115-195)/(44-96) 166/63 mmHg (01/04 0526) SpO2:  [94 %-100 %] 100 % (01/04 0400) Weight:  [167 lb 12.3 oz (76.1 kg)] 167 lb 12.3 oz (76.1 kg) (01/04 0400) 2 liters [place]  PHYSICAL EXAMINATION: General: ill appearing, sitting in chair Neuro:  Awake, alert, follows commands HEENT: no sinus tenderness Cardiovascular: regular Lungs: decreased breath sounds, no wheeze Abdomen:  Soft, non tender Musculoskeletal: decreased ankle edema Skin: no rashes  Labs: CBC Recent Labs     11/30/13  1004  12/01/13  0406  WBC  9.1  6.0  HGB  9.5*  9.6*  HCT  32.8*  33.4*  PLT  188  182   BMET Recent Labs     11/30/13  1004  12/01/13  0406  NA  135*  134*  K  4.4  4.9  CL  91*  92*  CO2  34*  32  BUN  23  27*  CREATININE  0.89  0.97  GLUCOSE  180*  166*    Electrolytes Recent Labs     11/30/13  1004   12/01/13  0406  CALCIUM  9.8  9.7   ABG Recent Labs     11/28/13  1956  PHART  7.228*  PCO2ART  77.5*  PO2ART  77.6*   Glucose Recent Labs     11/30/13  0005  11/30/13  0756  11/30/13  1145  11/30/13  1602  11/30/13  2028  11/30/13  2101  GLUCAP  105*  91  181*  305*  277*  177*    Imaging Dg Chest Port 1 View  11/29/2013   CLINICAL DATA:  Cough  EXAM: PORTABLE CHEST - 1 VIEW  COMPARISON:  11/26/2013  FINDINGS: Low lung volumes. Cardiac silhouette is enlarged. No focal regions of consolidation no focal infiltrates. There is slight increased prominence of interstitial markings when compared to the previous study. Degenerative changes identified within left shoulder.  IMPRESSION: Low lung volumes. Slight increased conspicuity of the interstitial markings may reflect underlying component of mild pulmonary edema. No focal regions of consolidation or focal infiltrates.   Electronically Signed   By: Margaree Mackintosh M.D.   On: 11/29/2013 11:16    ASSESSMENT / PLAN:  A: Acute hypoxic/hypercapnic respiratory failure from AECOPD, and probable PNA >> improved 1/04. P: -day 6 of rocephin, zithromax >> likely can d/c Abx soon -f/u CXR intermittently -oxygen to keep SpO2 > 90% -BiPAP prn -continue solumedrol to 40 mg q8h >> if she continues to improve,  then consider transition to prednisone on 1/5 -continue BD's >> changed to brovana, pulmicort, spiriva and prn albuterol 1/03  A: Acute on chronic sinusitis. P: -limit use of afrin to 2 days total >> d/c after doses on 1/04 -continue flonase, saline nasal spray  A: GERD as possible contributor to cough >> reports reflux better since d/c theophylline 11/03. Hx of Barrett's Esophagus. P: -continue pepcid, bid protonix  A: HTN with probable acute diastolic heart failure with pulmonary edema >> very good diuresis 11/03. P: -per primary team -negative fluid balance as tolerated >> repeat lasix 40 mg x one on  1/04  A: Anxiety. P: -try to limit benzo's, narcotics as tolerated in setting of respiratory failure  Summary: Much improved compared to 11/03.  Keep BiPAP on standby only.  Continue diuresis.  She also seems to be doing better with long acting BD's >> continue by nebulizer for now, and then eventually plan to transition to inhalers as she improves.  Recommend against resuming theophylline.  Chesley Mires, MD [facility] Pulmonary/Critical Care 12/01/2013, 7:53 AM Pager:  [phone] After 3pm call: [phone]

## 2013-12-01 NOTE — Progress Notes (Signed)
Pt seen, remains on 2lnc, hr 62, rr15, sats100%, bp142/63.  No increased wob or respiratory distress noted at this time.  Bipap in room on standby.  RN aware.

## 2013-12-01 NOTE — Progress Notes (Signed)
[facility] PROGRESS NOTE  [name] [id number] DOB: October 05, 1941 DOA: 11/26/2013 PCP: Leola Brazil, MD  Brief narrative: 73 year old female with past medical history of COPD, DM, hypertension GERD, glaucoma who presented to [facility] ED 11/26/2013 with complaints of worsening shortness of breath, subjective fevers and cough at home over past few days prior to this admission.  In ED, her O2 saturation was 98% but she was already on nasal canula for oxygen support. BP was 142/83, HR was 83 and Tmax was 99.9 F. Her CXR revealed cardiomegaly with possible pulmonary vascular congestion. She was given nebulizer treatment x 3 and solumedrol 125 mg IV one dose but continued to exhibit shortness of breath prompting admission to SDU. She requires intermittent BiPAP which she refuses ossacionally. I have confirmed partical code status with the patient's sister.   Assessment and Plan:   Principal Problem:  Acute exacerbation of chronic obstructive pulmonary disease (COPD)  - improving, no BiPAP in 12 hours, appreciate help from PCCM  - continue BD's, brovana, pulmicort, spiriva and prn albuterol - now s/p IV lasix x 2 w/ good output and stable GFR  - continue on solumedrol 40 mg IV q8h - cont antibiotics for pneumonia (azithromycin and rocephin)  - oxygen support to keep O2 saturation above 90%; BiPAP if needed per respiratory therapist  - appreciate PCCM following - holding home theophylline and will consider not resuming this per PCCM recs     Active Problems:  Community acquired pneumonia  - pneumonia order set in place  - continue azithromycin and rocephin  - flu and strep penumoniae - negative  - blood culture - no growth to date  COPD exacerbation  - treatment as above DIABETES-TYPE 2  - continue glimepiride and metformin  - CBG's in past 24 hours: 97, 87 and 103 Iron deficiency anemia  - above xfusion goal of 7  HYPERTENSION  - continue benicar per home dose -  increasing HCTZ to 25mg  daily given high BPs last night  Hyponatremia - likely dehydration - remains persistent in 130s  GERD  - continue protonix BID and pepcid at bedtime   Code Status: DNI only; this was confirmed in  discussion with the patient's sister at [phone] Family Communication: family not at the bedside  Disposition Plan: in ICU as she requires intermittent BiPAP, consider xfer to floor in next 1-2 if remains stable w/o need for BiPAP (last BiPAP was evening of 1/3)  Consultants:  None  Procedures:  None  Antibiotics:  Azithromycin 11/26/2013 -->  Rocephin 11/26/2013 -->   Velna Hatchet, MD  [facility] Pager [phone]  If 7PM-7AM, please contact night-coverage [email or web address] Password [email or web address] 12/01/2013, 11:19 AM   LOS: 5 days    HPI/Subjective: No BiPAP since yesterday afternoon. Feeling better this AM. Sitting in chair. S/p lasix x 2. Foley in place  Objective: Filed Vitals:   12/01/13 0800 12/01/13 0850 12/01/13 0900 12/01/13 1000  BP: 168/62  153/63 152/125  Pulse: 73  71 80  Temp: 97.1 F (36.2 C)     TempSrc: Oral     Resp: 22  19 21   Height:      Weight:      SpO2: 100% 98% 96% 98%    Intake/Output Summary (Last 24 hours) at 12/01/13 1119 Last data filed at 12/01/13 1000  Gross per 24 hour  Intake    770 ml  Output   3570 ml  Net  -2800 ml    Exam:  General:  Pt is alert, follows commands appropriately, not in acute distress; on BiPAP  Cardiovascular: Regular rate and rhythm, S1/S2 appreciated   Respiratory: expiratory wheezing present in bases   Abdomen: Soft, non tender, non distended, bowel sounds present, no guarding  Extremities: No edema, pulses DP and PT palpable bilaterally  Neuro: Grossly nonfocal  Data Reviewed: Basic Metabolic Panel:  Recent Labs Lab 11/26/13 1300 11/26/13 1828 11/27/13 0355 11/30/13 1004 12/01/13 0406  NA 132* 129* 131* 135* 134*  K 4.2 4.4 4.8 4.4 4.9  CL 90* 89* 91* 91* 92*   CO2 30 25 29  34* 32  GLUCOSE 333* 462* 415* 180* 166*  BUN 16 17 21 23  27*  CREATININE 0.86 0.92 0.99 0.89 0.97  CALCIUM 9.5 9.7 9.3 9.8 9.7  MG  --  1.4*  --   --   --   PHOS  --  4.3  --   --   --    Liver Function Tests:  Recent Labs Lab 11/26/13 1828 11/27/13 0355  AST 28 32  ALT 21 23  ALKPHOS 102 97  BILITOT <0.2* <0.2*  PROT 7.2 6.8  ALBUMIN 3.4* 3.0*   No results found for this basename: LIPASE, AMYLASE,  in the last 168 hours No results found for this basename: AMMONIA,  in the last 168 hours CBC:  Recent Labs Lab 11/26/13 1300 11/26/13 1828 11/27/13 0355 11/30/13 1004 12/01/13 0406  WBC 7.9 6.4 6.0 9.1 6.0  NEUTROABS  --  6.0  --   --   --   HGB 9.2* 9.4* 9.1* 9.5* 9.6*  HCT 31.2* 32.2* 31.8* 32.8* 33.4*  MCV 79.8 80.3 81.7 82.2 81.5  PLT 186 171 170 188 182   Cardiac Enzymes: No results found for this basename: CKTOTAL, CKMB, CKMBINDEX, TROPONINI,  in the last 168 hours BNP: No components found with this basename: POCBNP,  CBG:  Recent Labs Lab 11/30/13 1602 11/30/13 2028 11/30/13 2101 12/01/13 0334 12/01/13 0812  GLUCAP 305* 277* 177* 155* 143*    Recent Results (from the past 240 hour(s))  CULTURE, BLOOD (ROUTINE X 2)     Status: None   Collection Time    11/26/13  4:30 PM      Result Value Range Status   Specimen Description BLOOD BLOOD RIGHT FOREARM   Final   Value:        BLOOD CULTURE RECEIVED NO GROWTH TO DATE      Performed at [facility]   Report Status PENDING   Incomplete  MRSA PCR SCREENING     Status: None   Collection Time    11/26/13  5:55 PM      Result Value Range Status   MRSA by PCR NEGATIVE  NEGATIVE Final  CULTURE, BLOOD (ROUTINE X 2)     Status: None   Collection Time    11/26/13  6:27 PM      Result Value Range Status   Specimen Description BLOOD RIGHT HAND   Final   Value:        BLOOD CULTURE RECEIVED NO GROWTH TO DATE      Performed at [facility]   Report Status PENDING   Incomplete      Studies: Dg Chest Port 1 View 11/29/2013    IMPRESSION: Low lung volumes. Slight increased conspicuity of the interstitial markings may reflect underlying component of mild pulmonary edema. No focal regions of consolidation or focal infiltrates.   Electronically Signed   By: Cori Razor  Burt Knack M.D.   On: 11/29/2013 11:16    Scheduled Meds: . aspirin EC  81 mg Oral Daily  . azithromycin  500 mg Intravenous Q24H  . cefTRIAXone  1 g Intravenous Q24H  . famotidine  40 mg Oral QHS  . glimepiride  2 mg Oral Q breakfast  . hydrALAZINE  10 mg Oral Q8H  . irbesartan  300 mg Oral Daily  . hydrochlorothiazide  12.5 mg Oral Daily  . insulin aspart  0-9 Units Subcutaneous Q4H  . ipratropium-albuterol  3 mL Nebulization QID  . metFORMIN  500 mg Oral BID WC  . methylPREDNISolone   60 mg Intravenous Q24H  . oxymetazoline  2 spray Each Nare BID  . pantoprazole  40 mg Oral BID AC  . theophylline  300 mg Oral Q12H

## 2013-12-02 ENCOUNTER — Encounter (HOSPITAL_COMMUNITY): Payer: Self-pay

## 2013-12-02 LAB — CULTURE, BLOOD (ROUTINE X 2)
Culture: NO GROWTH
Culture: NO GROWTH

## 2013-12-02 LAB — GLUCOSE, CAPILLARY
GLUCOSE-CAPILLARY: 125 mg/dL — AB (ref 70–99)
GLUCOSE-CAPILLARY: 183 mg/dL — AB (ref 70–99)
Glucose-Capillary: 168 mg/dL — ABNORMAL HIGH (ref 70–99)
Glucose-Capillary: 233 mg/dL — ABNORMAL HIGH (ref 70–99)
Glucose-Capillary: 203 mg/dL — ABNORMAL HIGH (ref 70–99)

## 2013-12-02 LAB — RESPIRATORY VIRUS PANEL
Adenovirus: NOT DETECTED
INFLUENZA A H1: NOT DETECTED
INFLUENZA B 1: NOT DETECTED
Influenza A H3: NOT DETECTED
Influenza A: NOT DETECTED
Metapneumovirus: NOT DETECTED
PARAINFLUENZA 2 A: NOT DETECTED
Parainfluenza 1: NOT DETECTED
Parainfluenza 3: NOT DETECTED
Respiratory Syncytial Virus A: DETECTED — AB
Respiratory Syncytial Virus B: NOT DETECTED
Rhinovirus: NOT DETECTED

## 2013-12-02 LAB — BASIC METABOLIC PANEL
BUN: 33 mg/dL — AB (ref 6–23)
CALCIUM: 9.8 mg/dL (ref 8.4–10.5)
CHLORIDE: 91 meq/L — AB (ref 96–112)
CO2: 32 meq/L (ref 19–32)
CREATININE: 1.07 mg/dL (ref 0.50–1.10)
GFR calc Af Amer: 58 mL/min — ABNORMAL LOW (ref >90)
GFR calc non Af Amer: 50 mL/min — ABNORMAL LOW (ref >90)
Glucose, Bld: 181 mg/dL — ABNORMAL HIGH (ref 70–99)
Potassium: 4.7 mEq/L (ref 3.7–5.3)
Sodium: 133 mEq/L — ABNORMAL LOW (ref 137–147)

## 2013-12-02 MED ORDER — INSULIN ASPART 100 UNIT/ML ~~LOC~~ SOLN: 0.0000 [IU] | Freq: Three times a day (TID) | SUBCUTANEOUS | Status: DC

## 2013-12-02 MED ORDER — INSULIN ASPART 100 UNIT/ML ~~LOC~~ SOLN: 0.0000 [IU] | Freq: Every day | SUBCUTANEOUS | Status: DC

## 2013-12-02 MED ORDER — INSULIN ASPART 100 UNIT/ML ~~LOC~~ SOLN
0.0000 [IU] | Freq: Three times a day (TID) | SUBCUTANEOUS | Status: DC
  Administered 2013-12-03 – 2013-12-04 (×4): 7 [IU] via SUBCUTANEOUS
  Administered 2013-12-04: 2 [IU] via SUBCUTANEOUS

## 2013-12-02 NOTE — Progress Notes (Signed)
Name: [name] MRN: [id number] DOB: 07/06/1941    ADMISSION DATE:  11/26/2013 CONSULTATION DATE:  11/29/2013  REFERRING MD :  Charlies Silvers   CHIEF COMPLAINT: short of breath  BRIEF PATIENT DESCRIPTION:  73 yo female smoker with progressive dyspnea, cough, sputum and wheeze from AECOPD and possible PNA.  PCCM consult to assess hypoxic/hypercapnic respiratory failure.  SIGNIFICANT EVENTS: 12/30 Admit, temporarily needed BiPAP 01/02 Worsening hypoxia, decreased level of consciousness, PCCM consulted 1/5 NAD tx to floor STUDIES:   LINES / TUBES: PIV  CULTURES: BCX2 12/30>>>neg u strep 12/30>>>negative u legionella 12/30:>>>negative Influenza A&B 12/30>>> negative Respiratory viral panel 1/02>>>Respiratory Syncytial A+   ANTIBIOTICS: azithromycin 12/30>>> Rocephin 12/30>>>  SUBJECTIVE:  Did not need BiPAP since 1-3afternoon.  Breathing much better.  Still has cough and chest congestion.  Feels reflux much better. States today that she believes at least one contributor to her dyspnea was bladder distension > relieved by foley  VITAL SIGNS: Temp:  [97.3 F (36.3 C)-98.9 F (37.2 C)] 97.3 F (36.3 C) (01/05 0800) Pulse Rate:  [25-90] 61 (01/05 0800) Resp:  [12-31] 19 (01/05 0800) BP: (132-178)/(66-119) 167/72 mmHg (01/05 0800) SpO2:  [91 %-100 %] 92 % (01/05 0907) Weight:  [171 lb 1.2 oz (77.6 kg)] 171 lb 1.2 oz (77.6 kg) (01/05 0400) 2 liters [place]  PHYSICAL EXAMINATION: General: ill appearing, sitting in chair,NAD Neuro:  Awake, alert, follows commands HEENT: no sinus tenderness Cardiovascular: regular Lungs: decreased breath sounds, no wheeze Abdomen:  Soft, non tender Musculoskeletal: decreased ankle edema Skin: no rashes  Labs: CBC Recent Labs     11/30/13  1004  12/01/13  0406  WBC  9.1  6.0  HGB  9.5*  9.6*  HCT  32.8*  33.4*  PLT  188  182   BMET Recent Labs     11/30/13  1004  12/01/13  0406  12/02/13  0403  NA  135*  134*  133*  K  4.4   4.9  4.7  CL  91*  92*  91*  CO2  34*  32  32  BUN  23  27*  33*  CREATININE  0.89  0.97  1.07  GLUCOSE  180*  166*  181*    Electrolytes Recent Labs     11/30/13  1004  12/01/13  0406  12/02/13  0403  CALCIUM  9.8  9.7  9.8   ABG No results found for this basename: PHART, PCO2ART, PO2ART,  in the last 72 hours Glucose Recent Labs     12/01/13  1212  12/01/13  1640  12/01/13  1946  12/01/13  2347  12/02/13  0335  12/02/13  0749  GLUCAP  199*  183*  109*  183*  168*  125*    Imaging Dg Chest Port 1 View  12/01/2013   CLINICAL DATA:  Followup pneumonia and CHF.  EXAM: PORTABLE CHEST - 1 VIEW  COMPARISON:  11/29/2013  FINDINGS: Again noted are slightly prominent lung markings without frank pulmonary edema. Heart size is upper limits of normal but unchanged. Chronic degenerative changes at the right shoulder.  IMPRESSION: Prominent lung markings without frank pulmonary edema or focal airspace disease. No significant change from the prior examination.   Electronically Signed   By: Markus Daft M.D.   On: 12/01/2013 07:59    ASSESSMENT / PLAN:  A: Acute hypoxic/hypercapnic respiratory failure from AECOPD, and probable PNA >> improved 1/04. P: -day 7 of rocephin, zithromax >> likely can d/c  Abx 1/6 -f/u CXR intermittently -oxygen to keep SpO2 > 90% -continue solumedrol to 40 mg q8h >> if she continues to improve, then transition to prednisone on 1/5 40 mg qd -continue BD's >> changed to brovana, pulmicort, spiriva and prn albuterol 1/03; she is on advair + nebs prn at home, followed by Dr Katherine Roan  A: Acute on chronic sinusitis. P: -limit use of afrin to 2 days total >> d/c after doses on 1/04 -continue flonase, saline nasal spray  A: GERD as possible contributor to cough >> reports reflux better since d/c theophylline 11/03. Hx of Barrett's Esophagus. P: -continue pepcid, bid protonix  A: HTN with probable acute diastolic heart failure with pulmonary edema >> very  good diuresis 11/03. P: -per primary team -negative fluid balance as tolerated   A:  Possible urinary retention?? Per pt report, foley relieved distension and her breathing P: - will need a trial of removal foley to see if she can void, I will leave this to IM group - she sees Dr Janice Norrie as an outpt  A: Anxiety. P: -try to limit benzo's, narcotics as tolerated in setting of respiratory failure  Summary: Much improved compared to 1/04.  Keep BiPAP on standby only.  Continue diuresis.  She also seems to be doing better with long acting BD's >> continue by nebulizer for now, and then eventually plan to transition to inhalers as she improves.  Recommend against resuming theophylline. ? Move to floor 1/5  [facility] Minor ACNP Maryanna Shape PCCM Pager [phone] till 3 pm If no answer page [phone] 12/02/2013, 10:56 AM  Baltazar Apo, MD, PhD 12/02/2013, 12:54 PM [facility] [phone] or if no answer [phone]

## 2013-12-02 NOTE — Progress Notes (Signed)
Patient ID: [name], female   DOB: 1941-02-21, 73 y.o.   MRN: [id number] [facility] PROGRESS NOTE  [name] ZCH:[id number] DOB: [birth year] DOA: 11/26/2013 PCP: Leola Brazil, MD  Brief narrative: 73 year old female with past medical history of COPD, DM, hypertension GERD, glaucoma who presented to [facility] ED 11/26/2013 with complaints of worsening shortness of breath, subjective fevers and cough at home over past few days prior to this admission.   In ED, her O2 saturation was 98% but she was already on nasal canula for oxygen support. BP was 142/83, HR was 83 and Tmax was 99.9 F. Her CXR revealed cardiomegaly with possible pulmonary vascular congestion. She was given nebulizer treatment x 3 and solumedrol 125 mg IV one dose but continued to exhibit shortness of breath prompting admission to SDU. She requires intermittent BiPAP which she refuses ossacionally. I have confirmed partical code status with the patient's sister.   Assessment and Plan:  Principal Problem:  Acute exacerbation of chronic obstructive pulmonary disease (COPD)  - improving, no BiPAP in 12 hours, appreciate help from PCCM  - continue BD's, brovana, pulmicort, spiriva and prn albuterol - now s/p IV lasix x 2 w/ good output and stable GFR  - continue on solumedrol 40 mg IV q8h, use BiPAP prn   - cont antibiotics for pneumonia (azithromycin and rocephin)  - oxygen support to keep O2 saturation above 90%; BiPAP if needed per respiratory therapist  - holding home theophylline and will consider not resuming this per PCCM recs  Active Problems:  Community acquired pneumonia  - pneumonia order set in place  - continue azithromycin and rocephin, total 7 days   - flu and strep penumoniae - negative  - blood culture - no growth to date  COPD exacerbation  - treatment as above  DIABETES-TYPE 2  - continue glimepiride and metformin  - CBG's in past 24 hours: 160-180 - plan on tapering solumedrol to  prednisone over next 24 - 48 hours depending on clinical response  Iron deficiency anemia  - above xfusion goal of 7  - Hg stable over 24 hours  HYPERTENSION  - continue benicar per home dose  - reasonable inpatient control  - increasing HCTZ to 82m daily given high BPs last night  Hyponatremia  - likely dehydration  - remains persistent in 130s  GERD  - continue protonix BID and pepcid at bedtime   Code Status: DNI only; this was confirmed in discussion with the patient's sister at [phone] Family Communication: family not at the bedside  Disposition Plan: in ICU as she requires intermittent BiPAP, consider transfer to floor in next 24 hours if remains stable w/o need for BiPAP (last BiPAP was evening of 1/3)   Consultants:  None  Procedures:  None  Antibiotics:  Azithromycin 11/26/2013 -->  Rocephin 11/26/2013 -->  BCX2 12/30>>>neg  u strep 12/30>>>negative  u legionella 12/30:>>>negative  Influenza A&B 12/30>>> negative  Respiratory viral panel 1/02>>>Respiratory Syncytial A+   DLeisa Lenz MD  [facility] Pager [phone] If 7PM-7AM, please contact night-coverage [email or web address] Password [email or web address] 12/02/2013, 6:51 AM   LOS: 6 days   HPI/Subjective: Still with occasional exertional dyspnea   Objective: Filed Vitals:   12/02/13 0200 12/02/13 0300 12/02/13 0400 12/02/13 0546  BP:   146/68 146/68  Pulse:  62 66   Temp:   97.7 F (36.5 C)   TempSrc:   Oral   Resp: _0 Height:  Weight:   77.6 kg (171 lb 1.2 oz)   SpO2:  91% 92%     Intake/Output Summary (Last 24 hours) at 12/02/13 0651 Last data filed at 12/02/13 0500  Gross per 24 hour  Intake   1080 ml  Output   2465 ml  Net  -1385 ml    Exam:   General:  Pt is alert, follows commands appropriately, not in acute distress  Cardiovascular: Regular rate and rhythm, S1/S2 appreciated   Respiratory: rhodochrous in upper lung lobes, no wheezing   Abdomen: Soft, non tender, non  distended, bowel sounds present, no guarding  Extremities: No edema, pulses DP and PT palpable bilaterally  Neuro: Grossly nonfocal  Data Reviewed: Basic Metabolic Panel:  Recent Labs Lab 11/26/13 1300 11/26/13 1828 11/27/13 0355 11/30/13 1004 12/01/13 0406 12/02/13 0403  NA 132* 129* 131* 135* 134* 133*  K 4.2 4.4 4.8 4.4 4.9 4.7  CL 90* 89* 91* 91* 92* 91*  CO2 _0 34* 32 32  GLUCOSE 333* 462* 415* 180* 166* 181*  BUN _1 27* 33*  CREATININE 0.86 0.92 0.99 0.89 0.97 1.07  CALCIUM 9.5 9.7 9.3 9.8 9.7 9.8  MG  --  1.4*  --   --   --   --   PHOS  --  4.3  --   --   --   --    Liver Function Tests:  Recent Labs Lab 11/26/13 1828 11/27/13 0355  AST 28 32  ALT 21 23  ALKPHOS 102 97  BILITOT <0.2* <0.2*  PROT 7.2 6.8  ALBUMIN 3.4* 3.0*   CBC:  Recent Labs Lab 11/26/13 1300 11/26/13 1828 11/27/13 0355 11/30/13 1004 12/01/13 0406  WBC 7.9 6.4 6.0 9.1 6.0  NEUTROABS  --  6.0  --   --   --   HGB 9.2* 9.4* 9.1* 9.5* 9.6*  HCT 31.2* 32.2* 31.8* 32.8* 33.4*  MCV 79.8 80.3 81.7 82.2 81.5  PLT 186 171 170 188 182   CBG:  Recent Labs Lab 12/01/13 1212 12/01/13 1640 12/01/13 1946 12/01/13 2347 12/02/13 0335  GLUCAP 199* 183* 109* 183* 168*    Recent Results (from the past 240 hour(s))  CULTURE, BLOOD (ROUTINE X 2)     Status: None   Collection Time    11/26/13  4:30 PM      Result Value Range Status   Specimen Description BLOOD BLOOD RIGHT FOREARM   Final   Special Requests BOTTLES DRAWN AEROBIC AND ANAEROBIC 4 CC EA   Final   Culture  Setup Time     Final   Value: 11/26/2013 22:48     Performed at [facility]   Culture     Final   Value:        BLOOD CULTURE RECEIVED NO GROWTH TO DATE CULTURE WILL BE HELD FOR 5 DAYS BEFORE ISSUING A FINAL NEGATIVE REPORT     Performed at [facility]   Report Status PENDING   Incomplete  MRSA PCR SCREENING     Status: None   Collection Time    11/26/13  5:55 PM      Result Value  Range Status   MRSA by PCR NEGATIVE  NEGATIVE Final   Comment:            The GeneXpert MRSA Assay (FDA     approved for NASAL specimens     only), is one component of a     comprehensive MRSA  colonization     surveillance program. It is not     intended to diagnose MRSA     infection nor to guide or     monitor treatment for     MRSA infections.  CULTURE, BLOOD (ROUTINE X 2)     Status: None   Collection Time    11/26/13  6:27 PM      Result Value Range Status   Specimen Description BLOOD RIGHT HAND   Final   Special Requests BOTTLES DRAWN AEROBIC ONLY 5CC   Final   Culture  Setup Time     Final   Value: 11/26/2013 22:49     Performed at [facility]   Culture     Final   Value:        BLOOD CULTURE RECEIVED NO GROWTH TO DATE CULTURE WILL BE HELD FOR 5 DAYS BEFORE ISSUING A FINAL NEGATIVE REPORT     Performed at [facility]   Report Status PENDING   Incomplete  RESPIRATORY VIRUS PANEL     Status: Abnormal   Collection Time    11/29/13 12:06 PM      Result Value Range Status   Source - RVPAN NASAL SWAB   Corrected   Comment: CORRECTED ON 01/05 AT 0123: PREVIOUSLY REPORTED AS NASAL SWAB   Respiratory Syncytial Virus A DETECTED (*)  Final   Respiratory Syncytial Virus B NOT DETECTED   Final   Influenza A NOT DETECTED   Final   Influenza B NOT DETECTED   Final   Parainfluenza 1 NOT DETECTED   Final   Parainfluenza 2 NOT DETECTED   Final   Parainfluenza 3 NOT DETECTED   Final   Metapneumovirus NOT DETECTED   Final   Rhinovirus NOT DETECTED   Final   Adenovirus NOT DETECTED   Final   Influenza A H1 NOT DETECTED   Final   Influenza A H3 NOT DETECTED   Final   Comment: (NOTE)           Normal Reference Range for each Analyte: NOT DETECTED     Testing performed using the Luminex xTAG Respiratory Viral Panel test     kit.     This test was developed and its performance characteristics determined     by [facility]. It has not been cleared or approved  by the [place]     Food and Drug Administration. This test is used for clinical purposes.     It should not be regarded as investigational or for research. This     laboratory is certified under the [place] (CLIA) as qualified to perform high complexity     clinical laboratory testing.     Performed at [facility]     Studies: Dg Chest Port 1 View   12/01/2013   Prominent lung markings without frank pulmonary edema or focal airspace disease. No significant change from the prior examination.   Scheduled Meds: . arformoterol  15 mcg Nebulization BID  . aspirin EC  81 mg Oral Daily  . azithromycin  500 mg Intravenous Q24H  . brinzolamide  1 drop Both Eyes TID  . budesonide (PULMICORT) nebulizer solution  0.5 mg Nebulization BID  . cefTRIAXone (ROCEPHIN)  IV  1 g Intravenous Q24H  . famotidine  40 mg Oral QHS  . feeding supplement (GLUCERNA SHAKE)  237 mL Oral BID BM  . fluticasone  2 spray Each  Nare BID  . glimepiride  2 mg Oral Q breakfast  . hydrALAZINE  10 mg Oral Q8H  . hydrochlorothiazide  25 mg Oral Daily   And  . irbesartan  300 mg Oral Daily  . insulin aspart  0-9 Units Subcutaneous Q4H  . latanoprost  1 drop Both Eyes QHS  . metFORMIN  500 mg Oral BID WC  . methylPREDNISolone (SOLU-MEDROL) injection  40 mg Intravenous Q8H  . pantoprazole  40 mg Oral BID AC  . sodium chloride  2 spray Each Nare QID  . sodium chloride  3 mL Intravenous Q12H  . tiotropium  18 mcg Inhalation Daily   Continuous Infusions:

## 2013-12-02 NOTE — Progress Notes (Signed)
Physical Therapy Treatment Patient Details Name: SHAMINA ETHERIDGE MRN: [id number] DOB: 06-02-1941 Today's Date: 12/02/2013 Time: 1517-6160 PT Time Calculation (min): 17 min  PT Assessment / Plan / Recommendation  History of Present Illness 73 year old female with past medical history of COPD, DM, hypertension GERD, glaucoma who presented to [facility] ED 11/26/2013 with complaints of worsening shortness of breath and subjective fevers and cough at home over past few days prior to this admission. Pt used nebulizer treatments at home without significant symptomatic relief.    PT Comments   Pt continues with DOE and sats dropping < 90 on 2 l.Pt reports she will consider SNF/  Follow Up Recommendations  SNF;Supervision/Assistance - 24 hour     Does the patient have the potential to tolerate intense rehabilitation     Barriers to Discharge        Equipment Recommendations  None recommended by PT    Recommendations for Other Services    Frequency Min 3X/week   Progress towards PT Goals Progress towards PT goals: Progressing toward goals  Plan Current plan remains appropriate    Precautions / Restrictions Precautions Precautions: Fall Precaution Comments: monitor  sats. Restrictions Weight Bearing Restrictions: No        Mobility  Bed Mobility Supine to Sit: 4: Min assist Details for Bed Mobility Assistance: requiring incr time to get to sitting, use of rail with HOB elevated. Transfers Sit to Stand: 4: Min assist;From bed Stand to Sit: To chair/3-in-1;4: Min assist Stand Pivot Transfers: 4: Min assist Details for Transfer Assistance: requiring incr assist for balance and wt shift today as well as incr time for all movements; noted DOE Ambulation/Gait Ambulation/Gait Assistance: Not tested (comment)    Exercises     PT Diagnosis:    PT Problem List:   PT Treatment Interventions:     PT Goals (current goals can now be found in the care plan section)    Visit Information  Last PT Received On: 12/02/13 Assistance Needed: +2 History of Present Illness: 73 year old female with past medical history of COPD, DM, hypertension GERD, glaucoma who presented to [facility] ED 11/26/2013 with complaints of worsening shortness of breath and subjective fevers and cough at home over past few days prior to this admission. Pt used nebulizer treatments at home without significant symptomatic relief.     Subjective Data      Cognition  Cognition Arousal/Alertness: Awake/alert Behavior During Therapy: WFL for tasks assessed/performed    Balance  Static Sitting Balance Static Sitting - Balance Support: Bilateral upper extremity supported Static Sitting - Level of Assistance: 5: Stand by assistance Static Sitting - Comment/# of Minutes: pt sits and able to rech socks to pull them up.  End of Session PT - End of Session Activity Tolerance: Patient limited by fatigue Patient left: in chair;with call bell/phone within reach Nurse Communication: Mobility status   GP     Claretha Cooper 12/02/2013, 11:55 AM

## 2013-12-03 DIAGNOSIS — J189 Pneumonia, unspecified organism: Principal | ICD-10-CM

## 2013-12-03 LAB — GLUCOSE, CAPILLARY
GLUCOSE-CAPILLARY: 340 mg/dL — AB (ref 70–99)
Glucose-Capillary: 197 mg/dL — ABNORMAL HIGH (ref 70–99)
Glucose-Capillary: 269 mg/dL — ABNORMAL HIGH (ref 70–99)
Glucose-Capillary: 117 mg/dL — ABNORMAL HIGH (ref 70–99)

## 2013-12-03 LAB — BASIC METABOLIC PANEL
BUN: 37 mg/dL — ABNORMAL HIGH (ref 6–23)
CO2: 29 mEq/L (ref 19–32)
Calcium: 9.6 mg/dL (ref 8.4–10.5)
Chloride: 92 mEq/L — ABNORMAL LOW (ref 96–112)
Creatinine, Ser: 1.07 mg/dL (ref 0.50–1.10)
GFR calc Af Amer: 58 mL/min — ABNORMAL LOW (ref >90)
GFR, EST NON AFRICAN AMERICAN: 50 mL/min — AB (ref >90)
Glucose, Bld: 221 mg/dL — ABNORMAL HIGH (ref 70–99)
Potassium: 4.6 mEq/L (ref 3.7–5.3)
SODIUM: 133 meq/L — AB (ref 137–147)

## 2013-12-03 LAB — CBC
HCT: 34.2 % — ABNORMAL LOW (ref 36.0–46.0)
Hemoglobin: 10.4 g/dL — ABNORMAL LOW (ref 12.0–15.0)
MCH: 23.7 pg — ABNORMAL LOW (ref 26.0–34.0)
MCHC: 30.4 g/dL (ref 30.0–36.0)
MCV: 77.9 fL — ABNORMAL LOW (ref 78.0–100.0)
PLATELETS: 224 10*3/uL (ref 150–400)
RBC: 4.39 MIL/uL (ref 3.87–5.11)
RDW: 19.6 % — ABNORMAL HIGH (ref 11.5–15.5)
WBC: 13.1 10*3/uL — ABNORMAL HIGH (ref 4.0–10.5)

## 2013-12-03 MED ORDER — ALBUTEROL SULFATE (2.5 MG/3ML) 0.083% IN NEBU
2.5000 mg | INHALATION_SOLUTION | Freq: Four times a day (QID) | RESPIRATORY_TRACT | Status: DC
  Administered 2013-12-03 – 2013-12-04 (×4): 2.5 mg via RESPIRATORY_TRACT
  Filled 2013-12-03 (×5): qty 3

## 2013-12-03 MED ORDER — PREDNISONE 50 MG PO TABS
60.0000 mg | ORAL_TABLET | Freq: Every day | ORAL | Status: DC
  Administered 2013-12-04: 60 mg via ORAL
  Filled 2013-12-03 (×2): qty 1

## 2013-12-03 NOTE — Progress Notes (Signed)
Met with pt to discuss d/c planning. She stated her sister lives with her but works during the day hence she is alone at that time. She is not sure she will be safe to go home when she is medically cleared for d/c. She has used [facility] in the past and wishes to use them again. She however is considering going to a facility for ~ 1 week. CSW has been made aware of this. Referral has been made to [facility] as well.  Allene Dillon RN BSN   [phone]

## 2013-12-03 NOTE — Progress Notes (Signed)
Occupational Therapy Treatment Patient Details Name: [name] MRN: [id number] DOB: 10/19/1941 Today's Date: 12/03/2013 Time: 0625-0708 OT Time Calculation (min): 43 min  OT Assessment / Plan / Recommendation  History of present illness 73 year old female with past medical history of COPD, DM, hypertension GERD, glaucoma who presented to [facility] ED 11/26/2013 with complaints of worsening shortness of breath and subjective fevers and cough at home over past few days prior to this admission. Pt used nebulizer treatments at home without significant symptomatic relief.    OT comments  Awake and agreeable to participation in skilled o.t.  Able to complete ub/lb bathing/dressing with set up/s.  Initiates rest breaks and PLB tech. For sob and fatigue during session.  Eager for d/c home but also states she understands benefits of snf stay for con't. Strengthening "if needed".  Follow Up Recommendations  Home health OT;SNF           Equipment Recommendations  None recommended by OT        Frequency Min 2X/week   Progress towards OT Goals Progress towards OT goals: Progressing toward goals  Plan Discharge plan remains appropriate    Precautions / Restrictions Precautions Precautions: Fall   Pertinent Vitals/Pain 0/10, no complaints    ADL  Grooming: Performed;Wash/dry hands;Wash/dry face;Set up Where Assessed - Grooming: Unsupported sitting Upper Body Bathing: Performed;Chest;Right arm;Left arm;Abdomen;Set up Where Assessed - Upper Body Bathing: Unsupported sitting Lower Body Bathing: Performed;Supervision/safety;Set up Where Assessed - Lower Body Bathing: Unsupported sitting;Supported standing Upper Body Dressing: Performed;Min guard Where Assessed - Upper Body Dressing: Unsupported sitting Lower Body Dressing: Performed;Set up Where Assessed - Lower Body Dressing: Unsupported sitting Toilet Transfer: Simulated;Minimal assistance Toilet Transfer Method: Stand pivot Toileting -  Clothing Manipulation and Hygiene: Simulated;Supervision/safety Where Assessed - Toileting Clothing Manipulation and Hygiene: Standing Transfers/Ambulation Related to ADLs: stand pivot to recliner, min a and cues for hand placement on arm rests, also cues for o2 line management ADL Comments: set up/s for ub/lb bathing sit/stand. ue support in standing while washing peri areas.  ub/lb dressing s/min a sit/stand.  very motivated and demonstrated PLB tech. without cues       OT Goals(current goals can now be found in the care plan section)    Visit Information  Last OT Received On: 12/03/13 History of Present Illness: 73 year old female with past medical history of COPD, DM, hypertension GERD, glaucoma who presented to [facility] ED 11/26/2013 with complaints of worsening shortness of breath and subjective fevers and cough at home over past few days prior to this admission. Pt used nebulizer treatments at home without significant symptomatic relief.     Subjective Data   "i was told i would be on o2 the rest of my life but i have a fear of o2 tanks exploding in my house so i just pray about my breathing and that seems to have been helping".  Discussed and reviewed with pt. Need for o2 to maintain o2 levels and help her. She states "i know, i am trying to change and i realize i am stubborn"          Cognition  Cognition Arousal/Alertness: Awake/alert Behavior During Therapy: WFL for tasks assessed/performed Overall Cognitive Status: Within Functional Limits for tasks assessed    Mobility  Bed Mobility Details for Bed Mobility Assistance: all aspects of bed mobility with s Transfers Transfers: Sit to Stand;Stand to Sit Sit to Stand: 5: Supervision;From bed Stand to Sit: 5: Supervision;To chair/3-in-1;With armrests Details for Transfer  Assistance: required cues for hand placement and o2 tube management during pivot transfers               End of Session OT - End of Session Activity  Tolerance: Patient tolerated treatment well Patient left: in chair;with call bell/phone within reach       Janice Coffin, COTA/L 12/03/2013, 7:14 AM

## 2013-12-03 NOTE — Progress Notes (Addendum)
Clinical Social Work  After patient spoke with CM, patient reports she might reconsider SNF placement. CSW explained the need for insurance authorization through [facility] for SNF placement. Patient understanding and chose [facility]. CSW spoke with with admitting who confirmed that patient has [employer] and CSW spoke with [employer] representative Claiborne Billings). Insurance rep will run insurance and verify that patient's insurance is active. If patient is active, then rep can assist with insurance authorization. Rep will call CSW once researching patient's case.  CSW will continue to follow.  Sindy Messing, [place] [phone]  Addendum: 12/03/13 1510  CSW received a return call from insurance rep Claiborne Billings) who reports that patient is not part of [facility] PCP and therefore they cannot provide authorization. CSW contacted [place] with [employer] who explained that clinicals had been submitted and provided information about anticipated DC date along with patient's choice in facility. CSW will continue to assist and work with insurance in order to get authorization.

## 2013-12-03 NOTE — Progress Notes (Signed)
Clinical Social Work  CSW met with patient at bedside in order to discuss DC plans. CSW provided bed offers and explained that [facility] was unable to offer a bed. Patient did not like other offers and is worried about going to a facility where she knows other residents or staff members. After discussing options, patient reports that she was able to give herself a bath this morning and has been ambulating in the room. Patient lives with sister and reports that brother and sister assist with needs. Patient reports that she has equipment at home and has had [facility] in the past. Patient thanked CSW for time but is not agreeable to SNF. CSW alerted MD and CM of patient's desires. CSW is signing off but available if further needs arise.  [place], [place] [phone]

## 2013-12-03 NOTE — Progress Notes (Signed)
Physical Therapy Treatment Patient Details Name: SHARNAY CASHION MRN: [id number] DOB: 01/01/1941 Today's Date: 12/03/2013 Time: 7169-6789 PT Time Calculation (min): 11 min  PT Assessment / Plan / Recommendation  History of Present Illness 73 year old female with past medical history of COPD, DM, hypertension GERD, glaucoma who presented to [facility] ED 11/26/2013 with complaints of worsening shortness of breath and subjective fevers and cough at home over past few days prior to this admission. Pt used nebulizer treatments at home without significant symptomatic relief.    PT Comments   Progressing with mobility. Did not ambulate on RA this session. Pt is considering SNF but she prefers to d/c home. If pt is not agreeable to SNF, then recommend HHPT, 24/7 supervision/assist.   Follow Up Recommendations  SNF;Supervision/Assistance - 24 hour (if agreeable. Continue to recommend 24/7 supervison/assist)     Does the patient have the potential to tolerate intense rehabilitation     Barriers to Discharge        Equipment Recommendations  None recommended by PT    Recommendations for Other Services    Frequency Min 3X/week   Progress towards PT Goals Progress towards PT goals: Progressing toward goals  Plan Current plan remains appropriate    Precautions / Restrictions Precautions Precautions: Fall Precaution Comments: monitor  sats. Restrictions Weight Bearing Restrictions: No   Pertinent Vitals/Pain Dyspnea 2/4. Remained on O2 throughout session    Mobility  Bed Mobility Bed Mobility: Not assessed Transfers Transfers: Sit to Stand;Stand to Sit Sit to Stand: 5: Supervision;From bed Stand to Sit: 5: Supervision;To bed Details for Transfer Assistance: required cues for hand placement  Ambulation/Gait Ambulation/Gait Assistance: 4: Min guard Ambulation Distance (Feet): 115 Feet Assistive device: Rolling walker Gait Pattern: Step-through pattern;Decreased stride length    Exercises      PT Diagnosis:    PT Problem List:   PT Treatment Interventions:     PT Goals (current goals can now be found in the care plan section)    Visit Information  Last PT Received On: 12/03/13 Assistance Needed: +1 History of Present Illness: 73 year old female with past medical history of COPD, DM, hypertension GERD, glaucoma who presented to [facility] ED 11/26/2013 with complaints of worsening shortness of breath and subjective fevers and cough at home over past few days prior to this admission. Pt used nebulizer treatments at home without significant symptomatic relief.     Subjective Data      Cognition  Cognition Arousal/Alertness: Awake/alert Behavior During Therapy: WFL for tasks assessed/performed Overall Cognitive Status: Within Functional Limits for tasks assessed    Balance     End of Session PT - End of Session Equipment Utilized During Treatment: Gait belt;Oxygen Activity Tolerance: Patient limited by fatigue Patient left: in bed;with call bell/phone within reach (sitting EOB)   GP     Weston Anna, MPT Pager: [phone]

## 2013-12-03 NOTE — Progress Notes (Signed)
Patient ID: [name], female   DOB: 1941/03/27, 73 y.o.   MRN: [id number]  [facility] PROGRESS NOTE  CLODAGH ODENTHAL OYD:[id number] DOB: 1941-08-30 DOA: 11/26/2013 PCP: Leola Brazil, MD  Brief narrative: 73 year old female with past medical history of COPD, DM, hypertension GERD, glaucoma who presented to [facility] ED 11/26/2013 with complaints of worsening shortness of breath, subjective fevers and cough at home over past few days prior to this admission.   In ED, her O2 saturation was 98% but she was already on nasal canula for oxygen support. BP was 142/83, HR was 83 and Tmax was 99.9 F. Her CXR revealed cardiomegaly with possible pulmonary vascular congestion. She was given nebulizer treatment x 3 and solumedrol 125 mg IV one dose but continued to exhibit shortness of breath prompting admission to SDU. She requires intermittent BiPAP which she refuses ossacionally. I have confirmed partical code status with the patient's sister.   Assessment and Plan:  Principal Problem:  Acute exacerbation of chronic obstructive pulmonary disease (COPD)  - improving, no BiPAP in 24 hours, appreciate help from PCCM  - continue BD's, brovana, pulmicort, spiriva and prn albuterol - now s/p IV lasix x 2 w/ good output and stable GFR  - continue on solumedrol 40 mg IV q8h and plan on transitioning to PO Prednisone in AM - completed course of ABX Zithromax and Rocephin   - oxygen support to keep O2 saturation above 90% - holding home theophylline  Active Problems:  Community acquired pneumonia  - pneumonia order set in place  - continued azithromycin and rocephin, total 7 days and will discontinue today  - flu and strep penumoniae - negative  - blood culture - no growth to date  COPD exacerbation  - treatment as above  DIABETES-TYPE 2  - continue glimepiride and metformin  - CBG's in past 24 hours: 160-180  - plan on tapering solumedrol to prednisone over next 24 hours depending on  clinical response  Iron deficiency anemia  - above xfusion goal of 7  - Hg stable over 24 hours  HYPERTENSION  - continue benicar per home dose  - reasonable inpatient control  - increasing HCTZ to 21m daily given high BPs last night  Hyponatremia  - likely dehydration  - remains stable t in 130s  GERD  - continue protonix BID and pepcid at bedtime   Code Status: DNI only; this was confirmed in discussion with the patient's sister at [id number] Family Communication: family not at the bedside  Disposition Plan: in ICU as she requires intermittent BiPAP, consider transfer to floor in next 24 hours if remains stable w/o need for BiPAP (last BiPAP was evening of 1/3)   Consultants:  None  Procedures:  None  Antibiotics:  Azithromycin 11/26/2013 --> 01/06 Rocephin 11/26/2013 --> 01/06  BCX2 12/30>>>neg  u strep 12/30>>>negative  u legionella 12/30:>>>negative  Influenza A&B 12/30>>> negative  Respiratory viral panel 1/02>>>Respiratory Syncytial A+   DLeisa Lenz MD  [facility] Pager [phone] If 7PM-7AM, please contact night-coverage [email or web address] Password [facility]1/04/2014, 12:53 PM   LOS: 7 days    HPI/Subjective: Feels  better this AM, less dyspnea.  Objective: Filed Vitals:   12/02/13 1442 12/02/13 2125 12/03/13 0558 12/03/13 1152  BP: 146/56  107/71   Pulse:   59   Temp: 98.2 F (36.8 C)  98.9 F (37.2 C)   TempSrc: Oral  Oral   Resp: 20  16   Height:  Weight:    76.2 kg (167 lb 15.9 oz)  SpO2:  95% 92%     Intake/Output Summary (Last 24 hours) at 12/03/13 1253 Last data filed at 12/03/13 7517  Gross per 24 hour  Intake    363 ml  Output      0 ml  Net    363 ml    Exam:   General:  Pt is alert, follows commands appropriately, not in acute distress  Cardiovascular: Regular rate and rhythm, S1/S2, no murmurs, no rubs, no gallops  Respiratory: Clear to auscultation bilaterally, no wheezing, no crackles, no rhonchi  Abdomen:  Soft, non tender, non distended, bowel sounds present, no guarding  Extremities: No edema, pulses DP and PT palpable bilaterally  Neuro: Grossly nonfocal  Data Reviewed: Basic Metabolic Panel:  Recent Labs Lab 11/26/13 1300 11/26/13 1828 11/27/13 0355 11/30/13 1004 12/01/13 0406 12/02/13 0403 12/03/13 0515  NA 132* 129* 131* 135* 134* 133* 133*  K 4.2 4.4 4.8 4.4 4.9 4.7 4.6  CL 90* 89* 91* 91* 92* 91* 92*  CO2 30 25 29  34* 32 32 29  GLUCOSE 333* 462* 415* 180* 166* 181* 221*  BUN 16 17 21 23  27* 33* 37*  CREATININE 0.86 0.92 0.99 0.89 0.97 1.07 1.07  CALCIUM 9.5 9.7 9.3 9.8 9.7 9.8 9.6  MG  --  1.4*  --   --   --   --   --   PHOS  --  4.3  --   --   --   --   --    Liver Function Tests:  Recent Labs Lab 11/26/13 1828 11/27/13 0355  AST 28 32  ALT 21 23  ALKPHOS 102 97  BILITOT <0.2* <0.2*  PROT 7.2 6.8  ALBUMIN 3.4* 3.0*   CBC:  Recent Labs Lab 11/26/13 1828 11/27/13 0355 11/30/13 1004 12/01/13 0406 12/03/13 0515  WBC 6.4 6.0 9.1 6.0 13.1*  NEUTROABS 6.0  --   --   --   --   HGB 9.4* 9.1* 9.5* 9.6* 10.4*  HCT 32.2* 31.8* 32.8* 33.4* 34.2*  MCV 80.3 81.7 82.2 81.5 77.9*  PLT 171 170 188 182 224   CBG:  Recent Labs Lab 12/02/13 1154 12/02/13 1708 12/02/13 2302 12/03/13 0821 12/03/13 1107  GLUCAP 233* 203* 197* 269* 340*    Recent Results (from the past 240 hour(s))  CULTURE, BLOOD (ROUTINE X 2)     Status: None   Collection Time    11/26/13  4:30 PM      Result Value Range Status   Specimen Description BLOOD BLOOD RIGHT FOREARM   Final   Special Requests BOTTLES DRAWN AEROBIC AND ANAEROBIC 4 CC EA   Final   Culture  Setup Time     Final   Value: 11/26/2013 22:48     Performed at [facility]   Culture     Final   Value: NO GROWTH 5 DAYS     Performed at [facility]   Report Status 12/02/2013 FINAL   Final  MRSA PCR SCREENING     Status: None   Collection Time    11/26/13  5:55 PM      Result Value Range Status    MRSA by PCR NEGATIVE  NEGATIVE Final   Comment:            The GeneXpert MRSA Assay (FDA     approved for NASAL specimens     only), is one component of a  comprehensive MRSA colonization     surveillance program. It is not     intended to diagnose MRSA     infection nor to guide or     monitor treatment for     MRSA infections.  CULTURE, BLOOD (ROUTINE X 2)     Status: None   Collection Time    11/26/13  6:27 PM      Result Value Range Status   Specimen Description BLOOD RIGHT HAND   Final   Special Requests BOTTLES DRAWN AEROBIC ONLY 5CC   Final   Culture  Setup Time     Final   Value: 11/26/2013 22:49     Performed at [facility]   Culture     Final   Value: NO GROWTH 5 DAYS     Performed at [facility]   Report Status 12/02/2013 FINAL   Final  RESPIRATORY VIRUS PANEL     Status: Abnormal   Collection Time    11/29/13 12:06 PM      Result Value Range Status   Source - RVPAN NASAL SWAB   Corrected   Comment: CORRECTED ON 01/05 AT 0123: PREVIOUSLY REPORTED AS NASAL SWAB   Respiratory Syncytial Virus A DETECTED (*)  Final   Respiratory Syncytial Virus B NOT DETECTED   Final   Influenza A NOT DETECTED   Final   Influenza B NOT DETECTED   Final   Parainfluenza 1 NOT DETECTED   Final   Parainfluenza 2 NOT DETECTED   Final   Parainfluenza 3 NOT DETECTED   Final   Metapneumovirus NOT DETECTED   Final   Rhinovirus NOT DETECTED   Final   Adenovirus NOT DETECTED   Final   Influenza A H1 NOT DETECTED   Final   Influenza A H3 NOT DETECTED   Final   Comment: (NOTE)           Normal Reference Range for each Analyte: NOT DETECTED     Testing performed using the Luminex xTAG Respiratory Viral Panel test     kit.     This test was developed and its performance characteristics determined     by [facility]. It has not been cleared or approved by the [place]     Food and Drug Administration. This test is used for clinical purposes.     It should not be  regarded as investigational or for research. This     laboratory is certified under the [place] (CLIA) as qualified to perform high complexity     clinical laboratory testing.     Performed at [facility]     Studies: No results found.  Scheduled Meds: . arformoterol  15 mcg Nebulization BID  . aspirin EC  81 mg Oral Daily  . brinzolamide  1 drop Both Eyes TID  . budesonide nebulizer   0.5 mg Nebulization BID  . famotidine  40 mg Oral QHS  . fluticasone  2 spray Each Nare BID  . glimepiride  2 mg Oral Q breakfast  . hydrALAZINE  10 mg Oral Q8H  . hydrochlorothiazide  25 mg Oral Daily   And  . irbesartan  300 mg Oral Daily  . insulin aspart  0-5 Units Subcutaneous QHS  . insulin aspart  0-9 Units Subcutaneous TID WC  . latanoprost  1 drop Both Eyes QHS  . metFORMIN  500 mg Oral BID WC  . methylPREDNISolone  inj  40 mg Intravenous Q8H  . pantoprazole  40 mg Oral BID AC  . tiotropium  18 mcg Inhalation Daily   Continuous Infusions:

## 2013-12-03 NOTE — Progress Notes (Signed)
PULMONARY / CRITICAL CARE MEDICINE  Name: [name] MRN: [id number] DOB: 09-03-1941    ADMISSION DATE:  11/26/2013 CONSULTATION DATE:  11/29/2013  REFERRING MD :  TRH  CHIEF COMPLAINT: Short of breath  BRIEF PATIENT DESCRIPTION:  73 yo female smoker with progressive dyspnea, cough, sputum and wheeze from AECOPD and possible PNA.  PCCM consult to assess hypoxic/hypercapnic respiratory failure.  SIGNIFICANT EVENTS:  STUDIES:   LINES / TUBES:  CULTURES: 12/30    Blood >>>neg 12/30    Strep Ag >>>neg 12/30    Legionella Ag  >> neg 12/30    Influenza >>> neg 1/2        Respiratory viral panel >>> RSV   ANTIBIOTICS: Azithromycin 12/30 >>> ??? Rocephin 12/30 >>> ???  SUBJECTIVE:  Did not need BiPAP since 1-3afternoon.  Breathing much better.  Still has cough and chest congestion.  Feels reflux much better. States today that she believes at least one contributor to her dyspnea was bladder distension > relieved by foley. Better  VITAL SIGNS: Temp:  [98.2 F (36.8 C)-98.9 F (37.2 C)] 98.9 F (37.2 C) (01/06 0558) Pulse Rate:  [59] 59 (01/06 0558) Resp:  [16-20] 16 (01/06 0558) BP: (107-146)/(56-71) 107/71 mmHg (01/06 0558) SpO2:  [92 %-95 %] 92 % (01/06 0558) Weight:  [167 lb 15.9 oz (76.2 kg)] 167 lb 15.9 oz (76.2 kg) (01/06 1152)   PHYSICAL EXAMINATION: General: ill appearing, sitting in chair,NAD Neuro:  Awake, alert, follows commands HEENT: no sinus tenderness Cardiovascular: regular Lungs: decreased breath sounds, no wheeze Abdomen:  Soft, non tender Musculoskeletal: decreased ankle edema Skin: no rashes  CBC  Recent Labs Lab 11/30/13 1004 12/01/13 0406 12/03/13 0515  WBC 9.1 6.0 13.1*  HGB 9.5* 9.6* 10.4*  HCT 32.8* 33.4* 34.2*  PLT 188 182 224   Coag's  Recent Labs Lab 11/26/13 1828  APTT 26  INR 1.01   BMET  Recent Labs Lab 12/01/13 0406 12/02/13 0403 12/03/13 0515  NA 134* 133* 133*  K 4.9 4.7 4.6  CL 92* 91* 92*  CO2 32 32  29  BUN 27* 33* 37*  CREATININE 0.97 1.07 1.07  GLUCOSE 166* 181* 221*   Electrolytes  Recent Labs Lab 11/26/13 1828  12/01/13 0406 12/02/13 0403 12/03/13 0515  CALCIUM 9.7  < > 9.7 9.8 9.6  MG 1.4*  --   --   --   --   PHOS 4.3  --   --   --   --   < > = values in this interval not displayed. Sepsis Markers No results found for this basename: LATICACIDVEN, PROCALCITON, O2SATVEN,  in the last 168 hours ABG  Recent Labs Lab 11/28/13 1956  PHART 7.228*  PCO2ART 77.5*  PO2ART 77.6*   Liver Enzymes  Recent Labs Lab 11/26/13 1828 11/27/13 0355  AST 28 32  ALT 21 23  ALKPHOS 102 97  BILITOT <0.2* <0.2*  ALBUMIN 3.4* 3.0*   Cardiac Enzymes No results found for this basename: TROPONINI, PROBNP,  in the last 168 hours Glucose  Recent Labs Lab 12/02/13 0749 12/02/13 1154 12/02/13 1708 12/02/13 2302 12/03/13 0821 12/03/13 1107  GLUCAP 125* 233* 203* 197* 269* 340*   ASSESSMENT / PLAN:  Acute hypoxic respiratory failure - resolved.  Pneumonia - resolved, completed antibiotics.  AE COPD. Supplemental oxygen for SpO2>92 Albuterol PRN Add Albuterol QID Pulmicort / Spiriva / Brovana D/c Solu-medrol Start Prednisone 60  Acute on chronic sinusitis. Flonase Nasal saline  Richardson Landry Minor ACNP [place] Comment  [place] Pager [phone] till 3 pm If no answer page [phone] 12/03/2013, 12:25 PM  I have personally obtained history, examined patient, evaluated and interpreted laboratory and imaging results, reviewed medical records, formulated assessment / plan and placed orders.  Doree Fudge, MD [facility] [place] Pager: [phone]  12/03/2013, 5:05 PM

## 2013-12-04 ENCOUNTER — Inpatient Hospital Stay (HOSPITAL_COMMUNITY): Payer: Medicare Other

## 2013-12-04 LAB — BASIC METABOLIC PANEL
BUN: 47 mg/dL — AB (ref 6–23)
CALCIUM: 9.5 mg/dL (ref 8.4–10.5)
CO2: 28 meq/L (ref 19–32)
CREATININE: 1.38 mg/dL — AB (ref 0.50–1.10)
Chloride: 94 mEq/L — ABNORMAL LOW (ref 96–112)
GFR calc Af Amer: 43 mL/min — ABNORMAL LOW (ref >90)
GFR calc non Af Amer: 37 mL/min — ABNORMAL LOW (ref >90)
Glucose, Bld: 171 mg/dL — ABNORMAL HIGH (ref 70–99)
Potassium: 3.9 mEq/L (ref 3.7–5.3)
Sodium: 136 mEq/L — ABNORMAL LOW (ref 137–147)

## 2013-12-04 LAB — GLUCOSE, CAPILLARY
GLUCOSE-CAPILLARY: 206 mg/dL — AB (ref 70–99)
GLUCOSE-CAPILLARY: 326 mg/dL — AB (ref 70–99)
Glucose-Capillary: 149 mg/dL — ABNORMAL HIGH (ref 70–99)
Glucose-Capillary: 325 mg/dL — ABNORMAL HIGH (ref 70–99)

## 2013-12-04 LAB — CBC
HEMATOCRIT: 33.6 % — AB (ref 36.0–46.0)
Hemoglobin: 10 g/dL — ABNORMAL LOW (ref 12.0–15.0)
MCH: 23.5 pg — AB (ref 26.0–34.0)
MCHC: 29.8 g/dL — ABNORMAL LOW (ref 30.0–36.0)
MCV: 79.1 fL (ref 78.0–100.0)
Platelets: 192 10*3/uL (ref 150–400)
RBC: 4.25 MIL/uL (ref 3.87–5.11)
RDW: 19.9 % — AB (ref 11.5–15.5)
WBC: 9.8 10*3/uL (ref 4.0–10.5)

## 2013-12-04 MED ORDER — TIOTROPIUM BROMIDE MONOHYDRATE 18 MCG IN CAPS: 18.0000 ug | ORAL_CAPSULE | Freq: Every day | RESPIRATORY_TRACT | Status: DC

## 2013-12-04 MED ORDER — ACETAMINOPHEN 325 MG PO TABS: 650.0000 mg | ORAL_TABLET | Freq: Four times a day (QID) | ORAL | Status: DC | PRN

## 2013-12-04 MED ORDER — HYDROCODONE-ACETAMINOPHEN 5-325 MG PO TABS: 1.0000 | ORAL_TABLET | ORAL | Status: DC | PRN

## 2013-12-04 MED ORDER — BRINZOLAMIDE 1 % OP SUSP: 1.0000 [drp] | Freq: Three times a day (TID) | OPHTHALMIC | Status: DC

## 2013-12-04 MED ORDER — ENSURE COMPLETE PO LIQD: 237.0000 mL | Freq: Two times a day (BID) | ORAL | Status: DC

## 2013-12-04 MED ORDER — BUDESONIDE 0.5 MG/2ML IN SUSP: 0.5000 mg | Freq: Two times a day (BID) | RESPIRATORY_TRACT | Status: DC

## 2013-12-04 MED ORDER — SENNA 8.6 MG PO TABS
1.0000 | ORAL_TABLET | Freq: Two times a day (BID) | ORAL | Status: DC
Start: 2013-12-04 — End: 2014-04-06

## 2013-12-04 MED ORDER — ARFORMOTEROL TARTRATE 15 MCG/2ML IN NEBU: 15.0000 ug | INHALATION_SOLUTION | Freq: Two times a day (BID) | RESPIRATORY_TRACT | Status: DC

## 2013-12-04 MED ORDER — GUAIFENESIN-DM 100-10 MG/5ML PO SYRP: 5.0000 mL | ORAL_SOLUTION | ORAL | Status: DC | PRN

## 2013-12-04 MED ORDER — ALPRAZOLAM 0.5 MG PO TABS: 0.5000 mg | ORAL_TABLET | Freq: Two times a day (BID) | ORAL | Status: DC | PRN

## 2013-12-04 MED ORDER — HYDRALAZINE HCL 10 MG PO TABS: 10.0000 mg | ORAL_TABLET | Freq: Three times a day (TID) | ORAL | Status: DC

## 2013-12-04 MED ORDER — PREDNISONE 5 MG PO TABS: 50.0000 mg | ORAL_TABLET | Freq: Every day | ORAL | Status: DC

## 2013-12-04 MED ORDER — SIMETHICONE 80 MG PO CHEW: 80.0000 mg | CHEWABLE_TABLET | Freq: Four times a day (QID) | ORAL | Status: DC | PRN

## 2013-12-04 MED ORDER — GLUCERNA SHAKE PO LIQD: 237.0000 mL | Freq: Two times a day (BID) | ORAL | Status: DC

## 2013-12-04 MED ORDER — FAMOTIDINE 40 MG PO TABS: 40.0000 mg | ORAL_TABLET | Freq: Every day | ORAL | Status: DC

## 2013-12-04 NOTE — Progress Notes (Signed)
NUTRITION FOLLOW UP  Intervention:   - D/C Glucerna shakes - Ensure Complete BID - Will continue to monitor   Nutrition Dx:   Increased nutrient needs related to acute COPD exacerbation as evidenced by MD notes - ongoing    Goal:   Pt to consume >90% of meals/supplements - not met consistently    Monitor:   Weights, labs, intake  Assessment:   Pt with history of COPD, DM, hypertension GERD, glaucoma who presented to [facility] ED 11/26/2013 with complaints of worsening shortness of breath and subjective fevers and cough at home over past few days prior to this admission.   12/31- Met with pt who reports gradually losing weight over the years. Went from 215-220 pounds 2 years ago to 180 pounds currently, however pt weighed 177 pounds 5 months ago so she has been gaining some weight back. Reports trying to follow a bland diet due to having Barrett's esophagus. Reports she has trouble seeing so she eats mostly prepared foods as she doesn't want to hurt herself cooking. Sometimes her sister comes and lives with her and assists her in the kitchen. Pt tries to follow the diabetic diet and reports good blood sugar control PTA. Reports eating 2 meals/day plus snacks. Thinks she has been losing muscle recently in arms and legs.   1/7 - Pt eating excellent, 75-90% of meals, not drinking Glucerna shakes. Pt's weight down 13 pounds since admission.    Height: Ht Readings from Last 1 Encounters:  11/27/13 5' 5"  (1.651 m)    Weight Status:   Wt Readings from Last 1 Encounters:  12/04/13 167 lb 14.4 oz (76.159 kg)    Re-estimated needs:  Kcal: 1700-1900  Protein: 65-85g  Fluid: 1.7-1.9L/day   Skin: Intact   Diet Order: Carb Control   Intake/Output Summary (Last 24 hours) at 12/04/13 1413 Last data filed at 12/04/13 1300  Gross per 24 hour  Intake    720 ml  Output    700 ml  Net     20 ml    Last BM: 1/6    Labs:   Recent Labs Lab 12/02/13 0403 12/03/13 0515 12/04/13 0520   NA 133* 133* 136*  K 4.7 4.6 3.9  CL 91* 92* 94*  CO2 32 29 28  BUN 33* 37* 47*  CREATININE 1.07 1.07 1.38*  CALCIUM 9.8 9.6 9.5  GLUCOSE 181* 221* 171*    CBG (last 3)   Recent Labs  12/03/13 2237 12/04/13 0808 12/04/13 1130  GLUCAP 149* 206* 325*    Scheduled Meds: . albuterol  2.5 mg Nebulization QID  . arformoterol  15 mcg Nebulization BID  . aspirin EC  81 mg Oral Daily  . brinzolamide  1 drop Both Eyes TID  . budesonide (PULMICORT) nebulizer solution  0.5 mg Nebulization BID  . famotidine  40 mg Oral QHS  . feeding supplement (GLUCERNA SHAKE)  237 mL Oral BID BM  . fluticasone  2 spray Each Nare BID  . glimepiride  2 mg Oral Q breakfast  . hydrALAZINE  10 mg Oral Q8H  . hydrochlorothiazide  25 mg Oral Daily   And  . irbesartan  300 mg Oral Daily  . insulin aspart  0-5 Units Subcutaneous QHS  . insulin aspart  0-9 Units Subcutaneous TID WC  . latanoprost  1 drop Both Eyes QHS  . metFORMIN  500 mg Oral BID WC  . pantoprazole  40 mg Oral BID AC  . predniSONE  60 mg Oral  Q breakfast  . sodium chloride  2 spray Each Nare QID  . sodium chloride  3 mL Intravenous Q12H  . tiotropium  18 mcg Inhalation Daily     Mikey College MS, RD, LDN [phone] Pager [phone] After Hours Pager

## 2013-12-04 NOTE — Progress Notes (Signed)
Inpatient Diabetes Program Recommendations  AACE/ADA: New Consensus Statement on Inpatient Glycemic Control (2013)  Target Ranges:  Prepandial:   less than 140 mg/dL      Peak postprandial:   less than 180 mg/dL (1-2 hours)      Critically ill patients:  140 - 180 mg/dL   Reason for Assessment:  Hyperglycemia  Results for Shelby, Harding (MRN [id number]) as of 12/04/2013 13:50  Ref. Range 12/03/2013 08:21 12/03/2013 11:07 12/03/2013 16:43 12/03/2013 22:37 12/04/2013 08:08 12/04/2013 11:30  Glucose-Capillary Latest Range: 70-99 mg/dL 269 (H) 340 (H) 117 (H) 149 (H) 206 (H) 325 (H)    Inpatient Diabetes Program Recommendations Insulin - Meal Coverage: Add Novolog 4 units tidwc for meal coverage insulin while pt is on high dose Prednisone.  Thank you. Lorenda Peck, RD, LDN, CDE Inpatient Diabetes Coordinator [phone]

## 2013-12-04 NOTE — Progress Notes (Signed)
Pt has decided she wants to go home with home health services. [facility] made aware.  Allene Dillon RN BSN  [phone]

## 2013-12-04 NOTE — Progress Notes (Signed)
Clinical Social Work  CSW met with patient on two separate occasions today in order to discuss DC plans. Patient reports that she has thought about plans and spoken with family and feels it would be best to return home. Patient reports no safety concerns and feels that she will be able to manage well at home. Patient reports that sister and brother have agreed to assist with needs. Patient reports that she is still agreeable to [facility]. CSW made CM aware of patient's decision. CSW alerted MD of patient's DC plans. CSW informed SNF and insurance of patient's decision.  CSW is signing off but available if needed.  [place], [place] [phone]

## 2013-12-04 NOTE — Progress Notes (Signed)
Clinical Social Work  CSW spoke with SNF who reports they have already started authorization process for insurance but have not gotten final authorization yet. CSW called and left a message with insurance representative Danae Chen) explaining that patient should DC today and needs authorization.   CSW will continue to follow.  [place], Page [phone]

## 2013-12-04 NOTE — Progress Notes (Signed)
PULMONARY / CRITICAL CARE MEDICINE  Name: [name] MRN: [id number] DOB: 25-Apr-1941    ADMISSION DATE:  11/26/2013 CONSULTATION DATE:  11/29/2013  REFERRING MD :  TRH  CHIEF COMPLAINT: Short of breath  BRIEF PATIENT DESCRIPTION:  73 yo female smoker with progressive dyspnea, cough, sputum and wheeze from AECOPD and possible PNA.  PCCM consult to assess hypoxic/hypercapnic respiratory failure.  SIGNIFICANT EVENTS:  STUDIES:   LINES / TUBES:  CULTURES: 12/30    Blood >>>neg 12/30    Strep Ag >>>neg 12/30    Legionella Ag  >> neg 12/30    Influenza >>> neg 1/2        Respiratory viral panel >>> RSV   ANTIBIOTICS: Azithromycin 12/30 >>> ??? Rocephin 12/30 >>> ???  SUBJECTIVE:  Did not need BiPAP since 1-3 afternoon.  Breathing much better.  Still has cough and chest congestion.  Feels reflux much better. States today that she believes at least one contributor to her dyspnea was bladder distension > relieved by foley. Better 1-7 but still on O2   VITAL SIGNS: Temp:  [98.2 F (36.8 C)] 98.2 F (36.8 C) (01/07 0625) Pulse Rate:  [74-78] 74 (01/07 0625) Resp:  [18] 18 (01/07 0625) BP: (114-123)/(61-64) 123/61 mmHg (01/07 0625) SpO2:  [94 %-98 %] 96 % (01/07 1147) Weight:  [167 lb 14.4 oz (76.159 kg)] 167 lb 14.4 oz (76.159 kg) (01/07 0500)   PHYSICAL EXAMINATION: General: ill appearing, sitting in chair,NAD Neuro:  Awake, alert, follows commands HEENT: no sinus tenderness Cardiovascular: regular Lungs: decreased breath sounds, no wheeze Abdomen:  Soft, non tender Musculoskeletal: decreased ankle edema Skin: no rashes  CBC  Recent Labs Lab 12/01/13 0406 12/03/13 0515 12/04/13 0520  WBC 6.0 13.1* 9.8  HGB 9.6* 10.4* 10.0*  HCT 33.4* 34.2* 33.6*  PLT 182 224 192   Coag's No results found for this basename: APTT, INR,  in the last 168 hours BMET  Recent Labs Lab 12/02/13 0403 12/03/13 0515 12/04/13 0520  NA 133* 133* 136*  K 4.7 4.6 3.9  CL 91*  92* 94*  CO2 32 29 28  BUN 33* 37* 47*  CREATININE 1.07 1.07 1.38*  GLUCOSE 181* 221* 171*   Electrolytes  Recent Labs Lab 12/02/13 0403 12/03/13 0515 12/04/13 0520  CALCIUM 9.8 9.6 9.5   Sepsis Markers No results found for this basename: LATICACIDVEN, PROCALCITON, O2SATVEN,  in the last 168 hours ABG  Recent Labs Lab 11/28/13 1956  PHART 7.228*  PCO2ART 77.5*  PO2ART 77.6*   Liver Enzymes No results found for this basename: AST, ALT, ALKPHOS, BILITOT, ALBUMIN,  in the last 168 hours Cardiac Enzymes No results found for this basename: TROPONINI, PROBNP,  in the last 168 hours Glucose  Recent Labs Lab 12/03/13 0821 12/03/13 1107 12/03/13 1643 12/03/13 2237 12/04/13 0808 12/04/13 1130  GLUCAP 269* 340* 117* 149* 206* 325*   ASSESSMENT / PLAN:  Acute hypoxic respiratory failure - resolved.  Pneumonia - resolved, completed antibiotics.  AE COPD. Supplemental oxygen for SpO2>92 Albuterol PRN Add Albuterol QID Pulmicort / Spiriva / Brovana D/c Solu-medrol Started Prednisone 60  Acute on chronic sinusitis. Flonase Nasal saline  Richardson Landry Minor ACNP [facility] PCCM Pager [phone] till 3 pm If no answer page [phone] 12/04/2013, 2:06 PM

## 2013-12-04 NOTE — Discharge Summary (Signed)
Physician Discharge Summary  [name] BJY:[id number] DOB: May 08, 1941 DOA: 11/26/2013  PCP: Leola Brazil, MD  Admit date: 11/26/2013 Discharge date: 12/04/2013  Recommendations for Outpatient Follow-up:  1. please note the following changes for your COPD management: Stop Advair and theophyline; start using spiriva, brovana, pulmicort as recommended by pulmonary. Also use Duoneb as needed every 4-6 hours for shortness of breath or wheezing. 2. please note that we have noted the medications by diabetic coordinator which recommends adding insulin for glycemic control since patient is on prednisone. The plan is to taper the prednisone so it is alright for patient to continue metformin and glimepiride for right now. 3. please taper prednisone starting from 50 mg a day down to 0 mg and taper down by 5 mg a day and then stop. 4. we have stopped Benicar for blood pressure because of acute renal insufficiency. Creatinine is 1.38 prior to this discharge so have your primary care provider recheck renal function during your next appointment 5. for better blood pressure control we have added hydralazine 10 mg 3 times a day. 6. please note that metformin can cause renal insufficiency as well. You can continue taking metformin for right now but make sure that your primary care physician knows about renal insufficiency and perhaps discontinue metformin should renal function not improve.  Discharge Diagnoses:  Principal Problem:   Acute exacerbation of chronic obstructive pulmonary disease (COPD) Active Problems:   DIABETES-TYPE 2   ANEMIA, IRON DEFICIENCY   HYPERTENSION   GERD    Discharge Condition: medically stable for discharge home today; has refused to go to SNF as recommended by PT  Diet recommendation: as tolerated  History of present illness:  73 year old female with past medical history of COPD, DM, hypertension GERD, glaucoma who presented to [facility] ED 11/26/2013 with complaints  of worsening shortness of breath, subjective fevers and cough at home over past few days prior to this admission.  In ED, her O2 saturation was 98% but she was already on nasal canula for oxygen support. BP was 142/83, HR was 83 and Tmax was 99.9 F. Her CXR revealed cardiomegaly with possible pulmonary vascular congestion. She was given nebulizer treatment x 3 and solumedrol 125 mg IV one dose but continued to exhibit shortness of breath prompting admission to SDU. She required intermittent BiPAP which she refused ossacionally. I have confirmed partical code status with the patient's sister.   Assessment and Plan:   Principal Problem:  Acute exacerbation of chronic obstructive pulmonary disease (COPD)  - improving, no BiPAP in 48 hours, appreciate help from PCCM  - continue BD's, brovana, pulmicort, spiriva and prn albuterol - now s/p IV lasix x 2 w/ good output and stable GFR  - continue prednisone as above, taper down  by 5 mg a day from 50 mg to 0 mg   - completed course of ABX Zithromax and Rocephin  - oxygen support to keep O2 saturation above 90%   Active Problems:  Community acquired pneumonia  - pneumonia order set in place  - continued azithromycin and rocephin, total 7 dayscompleted - flu and strep penumoniae - negative  - blood culture - no growth to date  COPD exacerbation  - treatment as above  DIABETES-TYPE 2  - continue glimepiride and metformin  Iron deficiency anemia  - Hg stable over 24 hours  HYPERTENSION  - hydralazine of discharge and not benicar due to renal insufficiency Hyponatremia  - likely dehydration  - remains stable t  in 130s  GERD  - continue protonix BID and pepcid at bedtime   Code Status: DNI only; this was confirmed in discussion with the patient's sister at [phone]  Family Communication: family not at the bedside    Consultants:  PCCM Procedures:  None  Antibiotics:  Azithromycin 11/26/2013 --> 01/06  Rocephin 11/26/2013 -->  01/06   Respiratory viral panel 1/02>>>Respiratory Syncytial A+   Signed:  Leisa Lenz, MD  [facility] 12/04/2013, 2:14 PM  Pager #: [phone]   Discharge Exam: Filed Vitals:   12/04/13 0625  BP: 123/61  Pulse: 74  Temp: 98.2 F (36.8 C)  Resp: 18   Filed Vitals:   12/04/13 0500 12/04/13 0625 12/04/13 0842 12/04/13 1147  BP:  123/61    Pulse:  74    Temp:  98.2 F (36.8 C)    TempSrc:  Oral    Resp:  18    Height:      Weight: 76.159 kg (167 lb 14.4 oz)     SpO2:  94% 94% 96%    Exam:  General: Pt is alert, follows commands appropriately, not in acute distress  Cardiovascular: Regular rate and rhythm, S1/S2, no murmurs, no rubs, no gallops  Respiratory: Clear to auscultation bilaterally, no wheezing, no crackles, no rhonchi  Abdomen: Soft, non tender, non distended, bowel sounds present, no guarding  Extremities: No edema, pulses DP and PT palpable bilaterally  Neuro: Grossly nonfocal   Discharge Instructions  Discharge Orders   Future Appointments Provider Department Dept Phone   12/11/2013 12:30 PM [facility] [place] [facility] [address] [phone]   12/20/2013 10:00 AM [facility] [place] [facility] [place] [phone]   Future Orders Complete By Expires   Call MD for:  difficulty breathing, headache or visual disturbances  As directed    Call MD for:  persistant dizziness or light-headedness  As directed    Call MD for:  persistant nausea and vomiting  As directed    Call MD for:  severe uncontrolled pain  As directed    Diet - low sodium heart healthy  As directed    Discharge instructions  As directed    Comments:     1. please note the following changes for your COPD management: Stop Advair and theophyline; start using spiriva, brovana, pulmicort as recommended by pulmonary. Also use Duoneb as needed every 4-6 hours for shortness of breath or wheezing. 2. please note that we have  noted the medications by diabetic coordinator which recommends adding insulin for glycemic control since patient is on prednisone. The plan is to taper the prednisone so it is alright for patient to continue metformin and glimepiride for right now. 3. please taper prednisone starting from 50 mg a day down to 0 mg and taper down by 5 mg a day and then stop. 4. we have stopped Benicar for blood pressure because of acute renal insufficiency. Creatinine is 1.38 prior to this discharge so have your primary care provider recheck renal function during your next appointment 5. for better blood pressure control we have added hydralazine 10 mg 3 times a day. 6. please note that metformin can cause renal insufficiency as well. You can continue taking metformin for right now but make sure that your primary care physician knows about renal insufficiency and perhaps discontinue metformin should renal function not improve.   Increase activity slowly  As directed        Medication List  STOP taking these medications       Fluticasone-Salmeterol 250-50 MCG/DOSE Aepb  Commonly known as:  ADVAIR     olmesartan-hydrochlorothiazide 40-12.5 MG per tablet  Commonly known as:  BENICAR HCT     theophylline 200 MG 12 hr tablet  Commonly known as:  THEODUR      TAKE these medications       acetaminophen 325 MG tablet  Commonly known as:  TYLENOL  Take 2 tablets (650 mg total) by mouth every 6 (six) hours as needed for mild pain (or Fever >/= 101).     albuterol 108 (90 BASE) MCG/ACT inhaler  Commonly known as:  PROVENTIL HFA;VENTOLIN HFA  Inhale 1 puff into the lungs every 6 (six) hours as needed. FOR SHORTNESS OF BREATH     ALIGN 4 MG Caps  Take 4 mg by mouth daily.     ALPRAZolam 0.5 MG tablet  Commonly known as:  XANAX  Take 1 tablet (0.5 mg total) by mouth 2 (two) times daily as needed for anxiety or sleep.     ammonium lactate 12 % lotion  Commonly known as:  LAC-HYDRIN  Apply 1 application  topically as needed for dry skin (psorias).     arformoterol 15 MCG/2ML Nebu  Commonly known as:  BROVANA  Take 2 mLs (15 mcg total) by nebulization 2 (two) times daily.     aspirin EC 81 MG tablet  Take 81 mg by mouth daily.     brinzolamide 1 % ophthalmic suspension  Commonly known as:  AZOPT  Place 1 drop into both eyes 3 (three) times daily.     budesonide 0.5 MG/2ML nebulizer solution  Commonly known as:  PULMICORT  Take 2 mLs (0.5 mg total) by nebulization 2 (two) times daily.     Clobetasol Propionate 0.05 % external spray  Commonly known as:  TEMOVATE  Apply 1 spray topically 2 (two) times daily as needed. For psoriasis flare-ups     esomeprazole 40 MG capsule  Commonly known as:  NEXIUM  Take 40 mg by mouth daily before breakfast.     EYE VITAMINS Caps  Take 1 capsule by mouth daily.     famotidine 40 MG tablet  Commonly known as:  PEPCID  Take 1 tablet (40 mg total) by mouth at bedtime.     feeding supplement (GLUCERNA SHAKE) Liqd  Take 237 mLs by mouth 2 (two) times daily between meals.     fluticasone 50 MCG/ACT nasal spray  Commonly known as:  FLONASE  Place 2 sprays into both nostrils daily.     glimepiride 2 MG tablet  Commonly known as:  AMARYL  Take 2 mg by mouth daily with breakfast.     guaiFENesin-dextromethorphan 100-10 MG/5ML syrup  Commonly known as:  ROBITUSSIN DM  Take 5 mLs by mouth every 4 (four) hours as needed for cough.     hydrALAZINE 10 MG tablet  Commonly known as:  APRESOLINE  Take 1 tablet (10 mg total) by mouth every 8 (eight) hours.     HYDROcodone-acetaminophen 5-325 MG per tablet  Commonly known as:  NORCO/VICODIN  Take 1-2 tablets by mouth every 4 (four) hours as needed for moderate pain.     ipratropium-albuterol 0.5-2.5 (3) MG/3ML Soln  Commonly known as:  DUONEB  Take 3 mLs by nebulization every 4 (four) hours as needed (shortness of breath).     metFORMIN 500 MG tablet  Commonly known as:  GLUCOPHAGE  Take 500 mg  by  mouth 2 (two) times daily with a meal.     predniSONE 5 MG tablet  Commonly known as:  DELTASONE  Take 5 mg by mouth daily. On Monday, Wednesday, Friday     predniSONE 5 MG tablet  Commonly known as:  DELTASONE  Take 10 tablets (50 mg total) by mouth daily with breakfast.     ranitidine 150 MG capsule  Commonly known as:  ZANTAC  Take 150 mg by mouth at bedtime.     senna 8.6 MG Tabs tablet  Commonly known as:  SENOKOT  Take 1 tablet (8.6 mg total) by mouth 2 (two) times daily.     SIMBRINZA 1-0.2 % Susp  Generic drug:  Brinzolamide-Brimonidine  Apply 1 drop to eye 3 (three) times daily.     simethicone 80 MG chewable tablet  Commonly known as:  MYLICON  Chew 1 tablet (80 mg total) by mouth 4 (four) times daily as needed for flatulence.     tiotropium 18 MCG inhalation capsule  Commonly known as:  SPIRIVA  Place 1 capsule (18 mcg total) into inhaler and inhale daily.     Travoprost (BAK Free) 0.004 % Soln ophthalmic solution  Commonly known as:  TRAVATAN  Place 1 drop into both eyes at bedtime.           Follow-up Information   Follow up with Leola Brazil, MD.   Specialty:  Pulmonary Disease   Contact information:   [place] [address] [phone]        The results of significant diagnostics from this hospitalization (including imaging, microbiology, ancillary and laboratory) are listed below for reference.    Significant Diagnostic Studies: Dg Chest 2 View (if Patient Has Fever And/or Copd)  11/26/2013   CLINICAL DATA:  Short of breath.  History of COPD.  EXAM: CHEST  2 VIEW  COMPARISON:  01/01/2013  FINDINGS: The heart is mildly enlarged. The aorta is unfolded. The may be pulmonary venous hypertension but there is no frank edema, infiltrate, collapse or effusion.  IMPRESSION: Cardiomegaly. Possible pulmonary venous hypertension. No frank edema or focal pulmonary process.   Electronically Signed   By: Nelson Chimes M.D.    On: 11/26/2013 13:56   Dg Chest Port 1 View  12/01/2013   CLINICAL DATA:  Followup pneumonia and CHF.  EXAM: PORTABLE CHEST - 1 VIEW  COMPARISON:  11/29/2013  FINDINGS: Again noted are slightly prominent lung markings without frank pulmonary edema. Heart size is upper limits of normal but unchanged. Chronic degenerative changes at the right shoulder.  IMPRESSION: Prominent lung markings without frank pulmonary edema or focal airspace disease. No significant change from the prior examination.   Electronically Signed   By: Markus Daft M.D.   On: 12/01/2013 07:59   Dg Chest Port 1 View  11/29/2013   CLINICAL DATA:  Cough  EXAM: PORTABLE CHEST - 1 VIEW  COMPARISON:  11/26/2013  FINDINGS: Low lung volumes. Cardiac silhouette is enlarged. No focal regions of consolidation no focal infiltrates. There is slight increased prominence of interstitial markings when compared to the previous study. Degenerative changes identified within left shoulder.  IMPRESSION: Low lung volumes. Slight increased conspicuity of the interstitial markings may reflect underlying component of mild pulmonary edema. No focal regions of consolidation or focal infiltrates.   Electronically Signed   By: Margaree Mackintosh M.D.   On: 11/29/2013 11:16   Dg Abd Portable 1v  12/04/2013   CLINICAL DATA:  Abdominal pain and  distention following urinary bladder catheter removal yesterday  EXAM: PORTABLE ABDOMEN - 1 VIEW  COMPARISON:  None.  FINDINGS: There is considerable stool in the right colon with a moderate amount in the transverse and descending portions of the colon. There is gas in the rectosigmoid. There is no evidence of a small bowel obstruction. No free extraluminal gas collections are demonstrated. There is no definite evidence of a distended urinary bladder. There are numerous calcifications which project over the renal fossae bilaterally which may reflect calcified kidney stones. There are degenerative changes of the lumbar spine.  IMPRESSION:  1. The bowel gas pattern suggests constipation. There is no evidence of ileus nor of obstruction. 2. No definite silhouette of the distended urinary bladder is demonstrated. 3. There are calcifications projecting over both kidneys most compatible with stones. 4. There are degenerative changes of the mid and lower lumbar spine.   Electronically Signed   By: David  Martinique   On: 12/04/2013 13:47    Microbiology: Recent Results (from the past 240 hour(s))  CULTURE, BLOOD (ROUTINE X 2)     Status: None   Collection Time    11/26/13  4:30 PM      Result Value Range Status   Specimen Description BLOOD BLOOD RIGHT FOREARM   Final   Special Requests BOTTLES DRAWN AEROBIC AND ANAEROBIC 4 CC EA   Final   Culture  Setup Time     Final   Value: 11/26/2013 22:48     Performed at [facility]   Culture     Final   Value: NO GROWTH 5 DAYS     Performed at [facility]   Report Status 12/02/2013 FINAL   Final  MRSA PCR SCREENING     Status: None   Collection Time    11/26/13  5:55 PM      Result Value Range Status   MRSA by PCR NEGATIVE  NEGATIVE Final   Comment:            The GeneXpert MRSA Assay (FDA     approved for NASAL specimens     only), is one component of a     comprehensive MRSA colonization     surveillance program. It is not     intended to diagnose MRSA     infection nor to guide or     monitor treatment for     MRSA infections.  CULTURE, BLOOD (ROUTINE X 2)     Status: None   Collection Time    11/26/13  6:27 PM      Result Value Range Status   Specimen Description BLOOD RIGHT HAND   Final   Special Requests BOTTLES DRAWN AEROBIC ONLY 5CC   Final   Culture  Setup Time     Final   Value: 11/26/2013 22:49     Performed at [facility]   Culture     Final   Value: NO GROWTH 5 DAYS     Performed at [facility]   Report Status 12/02/2013 FINAL   Final  RESPIRATORY VIRUS PANEL     Status: Abnormal   Collection Time    11/29/13 12:06 PM       Result Value Range Status   Source - RVPAN NASAL SWAB   Corrected   Comment: CORRECTED ON 01/05 AT 0123: PREVIOUSLY REPORTED AS NASAL SWAB   Respiratory Syncytial Virus A DETECTED (*)  Final   Respiratory Syncytial Virus B NOT DETECTED  Final   Influenza A NOT DETECTED   Final   Influenza B NOT DETECTED   Final   Parainfluenza 1 NOT DETECTED   Final   Parainfluenza 2 NOT DETECTED   Final   Parainfluenza 3 NOT DETECTED   Final   Metapneumovirus NOT DETECTED   Final   Rhinovirus NOT DETECTED   Final   Adenovirus NOT DETECTED   Final   Influenza A H1 NOT DETECTED   Final   Influenza A H3 NOT DETECTED   Final   Comment: (NOTE)           Normal Reference Range for each Analyte: NOT DETECTED     Testing performed using the Luminex xTAG Respiratory Viral Panel test     kit.     This test was developed and its performance characteristics determined     by [facility]. It has not been cleared or approved by the [place]     Food and Drug Administration. This test is used for clinical purposes.     It should not be regarded as investigational or for research. This     laboratory is certified under the [place] (CLIA) as qualified to perform high complexity     clinical laboratory testing.     Performed at [facility]: Basic Metabolic Panel:  Recent Labs Lab 11/30/13 1004 12/01/13 0406 12/02/13 0403 12/03/13 0515 12/04/13 0520  NA 135* 134* 133* 133* 136*  K 4.4 4.9 4.7 4.6 3.9  CL 91* 92* 91* 92* 94*  CO2 34* 32 32 29 28  GLUCOSE 180* 166* 181* 221* 171*  BUN 23 27* 33* 37* 47*  CREATININE 0.89 0.97 1.07 1.07 1.38*  CALCIUM 9.8 9.7 9.8 9.6 9.5   Liver Function Tests: No results found for this basename: AST, ALT, ALKPHOS, BILITOT, PROT, ALBUMIN,  in the last 168 hours No results found for this basename: LIPASE, AMYLASE,  in the last 168 hours No results found for this basename: AMMONIA,  in the last 168  hours CBC:  Recent Labs Lab 11/30/13 1004 12/01/13 0406 12/03/13 0515 12/04/13 0520  WBC 9.1 6.0 13.1* 9.8  HGB 9.5* 9.6* 10.4* 10.0*  HCT 32.8* 33.4* 34.2* 33.6*  MCV 82.2 81.5 77.9* 79.1  PLT 188 182 224 192   Cardiac Enzymes: No results found for this basename: CKTOTAL, CKMB, CKMBINDEX, TROPONINI,  in the last 168 hours BNP: BNP (last 3 results) No results found for this basename: PROBNP,  in the last 8760 hours CBG:  Recent Labs Lab 12/03/13 1107 12/03/13 1643 12/03/13 2237 12/04/13 0808 12/04/13 1130  GLUCAP 340* 117* 149* 206* 325*    Time coordinating discharge: Over 30 minutes

## 2013-12-04 NOTE — Discharge Instructions (Signed)

## 2013-12-11 ENCOUNTER — Ambulatory Visit (HOSPITAL_COMMUNITY)
Admission: RE | Admit: 2013-12-11 | Discharge: 2013-12-11 | Disposition: A | Discharge status: Home / Self Care | Payer: Medicare HMO | Source: Ambulatory Visit | Attending: Cardiovascular Disease | Admitting: Cardiovascular Disease

## 2013-12-11 DIAGNOSIS — I739 Peripheral vascular disease, unspecified: Secondary | ICD-10-CM

## 2013-12-11 DIAGNOSIS — I6529 Occlusion and stenosis of unspecified carotid artery: Secondary | ICD-10-CM | POA: Insufficient documentation

## 2013-12-11 NOTE — Progress Notes (Signed)
Carotid Duplex Completed. °Brianna L Mazza,RVT °

## 2013-12-17 ENCOUNTER — Telehealth: Payer: Self-pay | Admitting: *Deleted

## 2013-12-17 ENCOUNTER — Encounter: Payer: Self-pay | Admitting: *Deleted

## 2013-12-17 DIAGNOSIS — I6529 Occlusion and stenosis of unspecified carotid artery: Secondary | ICD-10-CM

## 2013-12-17 NOTE — Telephone Encounter (Signed)
Message copied by Chauncy Lean on Tue Dec 17, 2013  9:47 AM ------      Message from: Lorretta Harp      Created: Sun Dec 15, 2013  4:35 PM       No change from prior study. Repeat in 6 months ------

## 2013-12-17 NOTE — Telephone Encounter (Signed)
Order placed for repeat carotid dopplers in 6 months  

## 2013-12-17 NOTE — Progress Notes (Signed)
Staff note  Did not staff this paatient with NP   Dr. Brand Males, M.D., [facility].C.P Pulmonary and Critical Care Medicine Staff Physician [place] [facility] Pager: [phone], If no answer or between  15:00h - 7:00h: call [phone]  12/17/2013 11:49 AM

## 2013-12-19 ENCOUNTER — Encounter: Payer: Self-pay | Admitting: *Deleted

## 2013-12-20 ENCOUNTER — Ambulatory Visit (HOSPITAL_COMMUNITY)
Admission: RE | Admit: 2013-12-20 | Discharge: 2013-12-20 | Disposition: A | Discharge status: Home / Self Care | Payer: Medicare HMO | Source: Ambulatory Visit | Attending: Cardiovascular Disease | Admitting: Cardiovascular Disease

## 2013-12-20 DIAGNOSIS — I739 Peripheral vascular disease, unspecified: Secondary | ICD-10-CM

## 2013-12-20 DIAGNOSIS — I70209 Unspecified atherosclerosis of native arteries of extremities, unspecified extremity: Secondary | ICD-10-CM | POA: Insufficient documentation

## 2013-12-20 NOTE — Progress Notes (Signed)
Lower Extremity Arterial Duplex Completed. °Brianna L Mazza,RVT °

## 2013-12-25 ENCOUNTER — Encounter: Payer: Self-pay | Admitting: *Deleted

## 2013-12-27 ENCOUNTER — Telehealth: Payer: Self-pay | Admitting: Cardiovascular Disease

## 2013-12-27 NOTE — Telephone Encounter (Signed)
Please call-received a letter from you about her results. She said she went to my-chart,but there were no results.

## 2013-12-30 NOTE — Telephone Encounter (Signed)
I spoke with patient and gave carotid doppler results.  The Internet connection was not working well enough for her to see the carotid results.

## 2013-12-31 ENCOUNTER — Encounter: Payer: Self-pay | Admitting: Gastroenterology

## 2013-12-31 ENCOUNTER — Ambulatory Visit (INDEPENDENT_AMBULATORY_CARE_PROVIDER_SITE_OTHER): Payer: Medicare HMO | Admitting: Gastroenterology

## 2013-12-31 ENCOUNTER — Encounter: Payer: Self-pay | Admitting: *Deleted

## 2013-12-31 ENCOUNTER — Telehealth: Payer: Self-pay | Admitting: *Deleted

## 2013-12-31 VITALS — BP 146/74 | HR 78 | Ht 64.0 in | Wt 170.0 lb

## 2013-12-31 DIAGNOSIS — R143 Flatulence: Secondary | ICD-10-CM

## 2013-12-31 DIAGNOSIS — R141 Gas pain: Secondary | ICD-10-CM

## 2013-12-31 DIAGNOSIS — K219 Gastro-esophageal reflux disease without esophagitis: Secondary | ICD-10-CM

## 2013-12-31 DIAGNOSIS — R142 Eructation: Secondary | ICD-10-CM

## 2013-12-31 DIAGNOSIS — I739 Peripheral vascular disease, unspecified: Secondary | ICD-10-CM

## 2013-12-31 DIAGNOSIS — K5730 Diverticulosis of large intestine without perforation or abscess without bleeding: Secondary | ICD-10-CM

## 2013-12-31 DIAGNOSIS — K589 Irritable bowel syndrome without diarrhea: Secondary | ICD-10-CM

## 2013-12-31 DIAGNOSIS — R140 Abdominal distension (gaseous): Secondary | ICD-10-CM

## 2013-12-31 NOTE — Telephone Encounter (Signed)
Order placed for repeat lower ext dopplers in 6  months 

## 2013-12-31 NOTE — Telephone Encounter (Signed)
Message copied by Chauncy Lean on Tue Dec 31, 2013 10:18 PM ------      Message from: Lorretta Harp      Created: Mon Dec 30, 2013  8:27 PM       No change from prior study. Repeat in 6 months ------

## 2013-12-31 NOTE — Progress Notes (Signed)
This is a 73 year old African American female recently admitted with severe shortness of breath and mild pulmonary insufficiency.  She has severe COPD but is not on home oxygen therapy.  He has a long history of GERD which is currently controlled with Nexium 40 mg a day.  She denies swallowing difficulties at this time.  Today, she complains of gas, bloating, and alternating diarrhea and constipation.  Her appetite is good her weight is stable.  She denies melena, hematochezia, nausea vomiting, or any specific systemic or hepatobiliary complaints.  Review of her record shows a normal upper GI series performed on 09/25/2012, and x-rays reviewed and do not show any evidence of an esophageal lesion.  Patient recently has had urinary obstruction is been on multiple antibiotics.  She takes Senokot twice a day, when necessary Citrucel which seems to constipate her, and is on multiple other medications including metformin for diabetes and Align probiotic use.  She has not been felt to be a candidate for repeat endoscopy or colonoscopy because of her severe pulmonary insufficiency.  Current Medications, Allergies, Past Medical History, Past Surgical History, Family History and Social History were reviewed in [facility] record.  ROS: All systems were reviewed and are negative unless otherwise stated in the HPI.          Physical Exam: blood pressure 146/74, pulse 78 and regular, and weight 170 with a BMI of 29 0.17.  She appears short of breath with sitting position but is able to ambulate.  Her abdomen is not distended and shows no organomegaly, masses, or tenderness.  Bowel sounds are normal.  Mental status is normal.    Assessment and Plan: This past May the patient had a normal ultrasound and HIDA scan of her gallbladder.  Liver also was normal on this exam.  She had a negative colonoscopy in 2007 does suffer extensive diverticulosis, and I think she has symptomatic diverticulosis  and irritable bowel syndrome.  She does not seem to have any acid reflux or swallowing problems at this time.  Examination of her oropharynx today also showed no evidence of Candida mucosa lesions.  I've asked her to decrease her Senokot once a day and we'll try Benefiber 1 tablespoon twice a day with her meals with liberal by mouth fluids.  I do not think she needs further GI evaluation at this time.  She does take protein supplements which probably are okay in moderation.  I'll Turner noted Dr. Hilarie Fredrickson for GI care after my retirement.  She has a stable normochromic normocytic anemia with a hemoglobin of approximately 10.  Stool Hemoccult cards and 2013 were negative.  She denies a history of lactose intolerance and she does not abuse of sorbitol or fructose in her diet.

## 2013-12-31 NOTE — Patient Instructions (Addendum)
Please follow up with Dr. Hilarie Fredrickson in six weeks.  Please purchase Benefiber over the counter and take twice daily   Stop Align and start VSL # 3  Samples of VSL # 3 was given today, please take two capsules by mouth once a day for two weeks(MUST KEEP IN THE REFRIGERATOR)

## 2014-01-15 ENCOUNTER — Encounter: Payer: Self-pay | Admitting: Gastroenterology

## 2014-01-22 ENCOUNTER — Encounter: Payer: Self-pay | Admitting: Podiatry

## 2014-01-22 ENCOUNTER — Ambulatory Visit (INDEPENDENT_AMBULATORY_CARE_PROVIDER_SITE_OTHER): Payer: Medicare HMO | Admitting: Podiatry

## 2014-01-22 VITALS — BP 154/74 | HR 94 | Resp 12

## 2014-01-22 DIAGNOSIS — M79609 Pain in unspecified limb: Secondary | ICD-10-CM

## 2014-01-22 DIAGNOSIS — B351 Tinea unguium: Secondary | ICD-10-CM

## 2014-01-23 NOTE — Progress Notes (Signed)
Patient ID: [name], female   DOB: 31-Oct-1941, 73 y.o.   MRN: [id number]  Subjective: This patient presents for ongoing debridement of painful mycotic toenails on the right foot the last visit for similar service was 10/21/2013.  Objective: Elongated, brittle, hypertrophic toenails with palpable tenderness 1 - 5 on the right foot. 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 Reappoint at three-month intervals

## 2014-03-16 ENCOUNTER — Inpatient Hospital Stay (HOSPITAL_COMMUNITY): Payer: Medicare Other

## 2014-03-16 ENCOUNTER — Inpatient Hospital Stay (HOSPITAL_COMMUNITY)
Admission: EM | Admit: 2014-03-16 | Discharge: 2014-03-21 | DRG: 377 | Disposition: A | Discharge status: Home Health | Payer: Medicare Other | Attending: Internal Medicine | Admitting: Internal Medicine

## 2014-03-16 ENCOUNTER — Encounter (HOSPITAL_COMMUNITY): Payer: Self-pay | Admitting: Internal Medicine

## 2014-03-16 ENCOUNTER — Emergency Department (HOSPITAL_COMMUNITY): Payer: Medicare Other

## 2014-03-16 DIAGNOSIS — K922 Gastrointestinal hemorrhage, unspecified: Secondary | ICD-10-CM | POA: Diagnosis present

## 2014-03-16 DIAGNOSIS — Z9981 Dependence on supplemental oxygen: Secondary | ICD-10-CM

## 2014-03-16 DIAGNOSIS — K7460 Unspecified cirrhosis of liver: Secondary | ICD-10-CM | POA: Diagnosis present

## 2014-03-16 DIAGNOSIS — R7989 Other specified abnormal findings of blood chemistry: Secondary | ICD-10-CM | POA: Diagnosis present

## 2014-03-16 DIAGNOSIS — M109 Gout, unspecified: Secondary | ICD-10-CM | POA: Diagnosis present

## 2014-03-16 DIAGNOSIS — R079 Chest pain, unspecified: Secondary | ICD-10-CM | POA: Diagnosis present

## 2014-03-16 DIAGNOSIS — Z79899 Other long term (current) drug therapy: Secondary | ICD-10-CM

## 2014-03-16 DIAGNOSIS — R140 Abdominal distension (gaseous): Secondary | ICD-10-CM | POA: Diagnosis present

## 2014-03-16 DIAGNOSIS — R799 Abnormal finding of blood chemistry, unspecified: Secondary | ICD-10-CM

## 2014-03-16 DIAGNOSIS — I2489 Other forms of acute ischemic heart disease: Secondary | ICD-10-CM | POA: Diagnosis present

## 2014-03-16 DIAGNOSIS — IMO0002 Reserved for concepts with insufficient information to code with codable children: Secondary | ICD-10-CM

## 2014-03-16 DIAGNOSIS — D649 Anemia, unspecified: Secondary | ICD-10-CM

## 2014-03-16 DIAGNOSIS — R142 Eructation: Secondary | ICD-10-CM

## 2014-03-16 DIAGNOSIS — IMO0001 Reserved for inherently not codable concepts without codable children: Secondary | ICD-10-CM | POA: Diagnosis present

## 2014-03-16 DIAGNOSIS — J9600 Acute respiratory failure, unspecified whether with hypoxia or hypercapnia: Secondary | ICD-10-CM | POA: Diagnosis not present

## 2014-03-16 DIAGNOSIS — K209 Esophagitis, unspecified without bleeding: Secondary | ICD-10-CM

## 2014-03-16 DIAGNOSIS — R141 Gas pain: Secondary | ICD-10-CM

## 2014-03-16 DIAGNOSIS — Z888 Allergy status to other drugs, medicaments and biological substances status: Secondary | ICD-10-CM

## 2014-03-16 DIAGNOSIS — D62 Acute posthemorrhagic anemia: Secondary | ICD-10-CM | POA: Diagnosis present

## 2014-03-16 DIAGNOSIS — I5042 Chronic combined systolic (congestive) and diastolic (congestive) heart failure: Secondary | ICD-10-CM | POA: Diagnosis present

## 2014-03-16 DIAGNOSIS — K921 Melena: Principal | ICD-10-CM | POA: Diagnosis present

## 2014-03-16 DIAGNOSIS — N183 Chronic kidney disease, stage 3 unspecified: Secondary | ICD-10-CM | POA: Diagnosis present

## 2014-03-16 DIAGNOSIS — R0789 Other chest pain: Secondary | ICD-10-CM

## 2014-03-16 DIAGNOSIS — K449 Diaphragmatic hernia without obstruction or gangrene: Secondary | ICD-10-CM | POA: Diagnosis present

## 2014-03-16 DIAGNOSIS — I739 Peripheral vascular disease, unspecified: Secondary | ICD-10-CM | POA: Diagnosis present

## 2014-03-16 DIAGNOSIS — F17200 Nicotine dependence, unspecified, uncomplicated: Secondary | ICD-10-CM | POA: Diagnosis present

## 2014-03-16 DIAGNOSIS — M549 Dorsalgia, unspecified: Secondary | ICD-10-CM | POA: Diagnosis present

## 2014-03-16 DIAGNOSIS — S98919A Complete traumatic amputation of unspecified foot, level unspecified, initial encounter: Secondary | ICD-10-CM

## 2014-03-16 DIAGNOSIS — I10 Essential (primary) hypertension: Secondary | ICD-10-CM | POA: Diagnosis present

## 2014-03-16 DIAGNOSIS — Z8261 Family history of arthritis: Secondary | ICD-10-CM

## 2014-03-16 DIAGNOSIS — E119 Type 2 diabetes mellitus without complications: Secondary | ICD-10-CM | POA: Diagnosis present

## 2014-03-16 DIAGNOSIS — Z833 Family history of diabetes mellitus: Secondary | ICD-10-CM

## 2014-03-16 DIAGNOSIS — I959 Hypotension, unspecified: Secondary | ICD-10-CM | POA: Diagnosis not present

## 2014-03-16 DIAGNOSIS — E7800 Pure hypercholesterolemia, unspecified: Secondary | ICD-10-CM | POA: Diagnosis present

## 2014-03-16 DIAGNOSIS — R778 Other specified abnormalities of plasma proteins: Secondary | ICD-10-CM | POA: Diagnosis present

## 2014-03-16 DIAGNOSIS — F329 Major depressive disorder, single episode, unspecified: Secondary | ICD-10-CM | POA: Diagnosis present

## 2014-03-16 DIAGNOSIS — I5043 Acute on chronic combined systolic (congestive) and diastolic (congestive) heart failure: Secondary | ICD-10-CM | POA: Diagnosis present

## 2014-03-16 DIAGNOSIS — G8929 Other chronic pain: Secondary | ICD-10-CM | POA: Diagnosis present

## 2014-03-16 DIAGNOSIS — I509 Heart failure, unspecified: Secondary | ICD-10-CM | POA: Diagnosis present

## 2014-03-16 DIAGNOSIS — L4050 Arthropathic psoriasis, unspecified: Secondary | ICD-10-CM | POA: Diagnosis present

## 2014-03-16 DIAGNOSIS — Z801 Family history of malignant neoplasm of trachea, bronchus and lung: Secondary | ICD-10-CM

## 2014-03-16 DIAGNOSIS — E1165 Type 2 diabetes mellitus with hyperglycemia: Secondary | ICD-10-CM

## 2014-03-16 DIAGNOSIS — Z7982 Long term (current) use of aspirin: Secondary | ICD-10-CM

## 2014-03-16 DIAGNOSIS — J4489 Other specified chronic obstructive pulmonary disease: Secondary | ICD-10-CM | POA: Diagnosis present

## 2014-03-16 DIAGNOSIS — I129 Hypertensive chronic kidney disease with stage 1 through stage 4 chronic kidney disease, or unspecified chronic kidney disease: Secondary | ICD-10-CM | POA: Diagnosis present

## 2014-03-16 DIAGNOSIS — Z961 Presence of intraocular lens: Secondary | ICD-10-CM

## 2014-03-16 DIAGNOSIS — R143 Flatulence: Secondary | ICD-10-CM

## 2014-03-16 DIAGNOSIS — F3289 Other specified depressive episodes: Secondary | ICD-10-CM | POA: Diagnosis present

## 2014-03-16 DIAGNOSIS — Z9849 Cataract extraction status, unspecified eye: Secondary | ICD-10-CM

## 2014-03-16 DIAGNOSIS — K219 Gastro-esophageal reflux disease without esophagitis: Secondary | ICD-10-CM | POA: Diagnosis present

## 2014-03-16 DIAGNOSIS — D509 Iron deficiency anemia, unspecified: Secondary | ICD-10-CM

## 2014-03-16 DIAGNOSIS — F410 Panic disorder [episodic paroxysmal anxiety] without agoraphobia: Secondary | ICD-10-CM | POA: Diagnosis present

## 2014-03-16 DIAGNOSIS — J441 Chronic obstructive pulmonary disease with (acute) exacerbation: Secondary | ICD-10-CM

## 2014-03-16 DIAGNOSIS — J449 Chronic obstructive pulmonary disease, unspecified: Secondary | ICD-10-CM | POA: Diagnosis present

## 2014-03-16 DIAGNOSIS — R748 Abnormal levels of other serum enzymes: Secondary | ICD-10-CM

## 2014-03-16 DIAGNOSIS — I248 Other forms of acute ischemic heart disease: Secondary | ICD-10-CM | POA: Diagnosis present

## 2014-03-16 DIAGNOSIS — K2270 Barrett's esophagus without dysplasia: Secondary | ICD-10-CM | POA: Diagnosis present

## 2014-03-16 DIAGNOSIS — F411 Generalized anxiety disorder: Secondary | ICD-10-CM | POA: Diagnosis present

## 2014-03-16 DIAGNOSIS — Z9089 Acquired absence of other organs: Secondary | ICD-10-CM

## 2014-03-16 LAB — PROTIME-INR
INR: 1.19 (ref 0.00–1.49)
Prothrombin Time: 14.8 seconds (ref 11.6–15.2)

## 2014-03-16 LAB — MRSA PCR SCREENING: MRSA by PCR: NEGATIVE

## 2014-03-16 LAB — CBC
HCT: 22.9 % — ABNORMAL LOW (ref 36.0–46.0)
Hemoglobin: 6.2 g/dL — CL (ref 12.0–15.0)
MCH: 18.6 pg — AB (ref 26.0–34.0)
MCHC: 27.1 g/dL — ABNORMAL LOW (ref 30.0–36.0)
MCV: 68.6 fL — AB (ref 78.0–100.0)
PLATELETS: 291 10*3/uL (ref 150–400)
RBC: 3.34 MIL/uL — ABNORMAL LOW (ref 3.87–5.11)
RDW: 20.4 % — ABNORMAL HIGH (ref 11.5–15.5)
WBC: 10.5 10*3/uL (ref 4.0–10.5)

## 2014-03-16 LAB — COMPREHENSIVE METABOLIC PANEL
ALT: 21 U/L (ref 0–35)
AST: 20 U/L (ref 0–37)
Albumin: 3.2 g/dL — ABNORMAL LOW (ref 3.5–5.2)
Alkaline Phosphatase: 88 U/L (ref 39–117)
BUN: 14 mg/dL (ref 6–23)
CALCIUM: 9.7 mg/dL (ref 8.4–10.5)
CO2: 22 mEq/L (ref 19–32)
CREATININE: 0.94 mg/dL (ref 0.50–1.10)
Chloride: 103 mEq/L (ref 96–112)
GFR, EST AFRICAN AMERICAN: 68 mL/min — AB (ref >90)
GFR, EST NON AFRICAN AMERICAN: 59 mL/min — AB (ref >90)
GLUCOSE: 210 mg/dL — AB (ref 70–99)
Potassium: 4.6 mEq/L (ref 3.7–5.3)
Sodium: 138 mEq/L (ref 137–147)
Total Bilirubin: 0.2 mg/dL — ABNORMAL LOW (ref 0.3–1.2)
Total Protein: 6.6 g/dL (ref 6.0–8.3)

## 2014-03-16 LAB — GLUCOSE, CAPILLARY
GLUCOSE-CAPILLARY: 139 mg/dL — AB (ref 70–99)
Glucose-Capillary: 241 mg/dL — ABNORMAL HIGH (ref 70–99)

## 2014-03-16 LAB — LIPASE, BLOOD: Lipase: 21 U/L (ref 11–59)

## 2014-03-16 LAB — POC OCCULT BLOOD, ED: Fecal Occult Bld: POSITIVE — AB

## 2014-03-16 LAB — I-STAT TROPONIN, ED: TROPONIN I, POC: 0.16 ng/mL — AB (ref 0.00–0.08)

## 2014-03-16 LAB — APTT: aPTT: 27 seconds (ref 24–37)

## 2014-03-16 LAB — PREPARE RBC (CROSSMATCH)

## 2014-03-16 LAB — TROPONIN I: Troponin I: 0.38 ng/mL (ref <0.30)

## 2014-03-16 MED ORDER — ALUM & MAG HYDROXIDE-SIMETH 200-200-20 MG/5ML PO SUSP
30.0000 mL | Freq: Four times a day (QID) | ORAL | Status: DC | PRN
  Administered 2014-03-16 – 2014-03-20 (×8): 30 mL via ORAL
  Filled 2014-03-16 (×8): qty 30

## 2014-03-16 MED ORDER — TIOTROPIUM BROMIDE MONOHYDRATE 18 MCG IN CAPS
18.0000 ug | ORAL_CAPSULE | Freq: Every day | RESPIRATORY_TRACT | Status: DC
  Administered 2014-03-17 – 2014-03-21 (×5): 18 ug via RESPIRATORY_TRACT
  Filled 2014-03-16 (×2): qty 5

## 2014-03-16 MED ORDER — SODIUM CHLORIDE 0.9 % IV SOLN
8.0000 mg/h | INTRAVENOUS | Status: DC
  Administered 2014-03-16 – 2014-03-19 (×6): 8 mg/h via INTRAVENOUS
  Filled 2014-03-16 (×14): qty 80

## 2014-03-16 MED ORDER — PANTOPRAZOLE SODIUM 40 MG IV SOLR
40.0000 mg | Freq: Two times a day (BID) | INTRAVENOUS | Status: DC
  Filled 2014-03-16: qty 40

## 2014-03-16 MED ORDER — BUDESONIDE-FORMOTEROL FUMARATE 160-4.5 MCG/ACT IN AERO
2.0000 | INHALATION_SPRAY | Freq: Every evening | RESPIRATORY_TRACT | Status: DC
  Administered 2014-03-16 – 2014-03-20 (×4): 2 via RESPIRATORY_TRACT
  Filled 2014-03-16: qty 6

## 2014-03-16 MED ORDER — SODIUM CHLORIDE 0.9 % IJ SOLN
3.0000 mL | Freq: Two times a day (BID) | INTRAMUSCULAR | Status: DC
Start: 2014-03-16 — End: 2014-03-21
  Administered 2014-03-16 – 2014-03-20 (×6): 3 mL via INTRAVENOUS

## 2014-03-16 MED ORDER — ALBUTEROL SULFATE (2.5 MG/3ML) 0.083% IN NEBU
2.5000 mg | INHALATION_SOLUTION | RESPIRATORY_TRACT | Status: DC | PRN
  Administered 2014-03-17 (×2): 2.5 mg via RESPIRATORY_TRACT
  Filled 2014-03-16: qty 3

## 2014-03-16 MED ORDER — LATANOPROST 0.005 % OP SOLN
1.0000 [drp] | Freq: Every day | OPHTHALMIC | Status: DC
  Administered 2014-03-16 – 2014-03-20 (×5): 1 [drp] via OPHTHALMIC
  Filled 2014-03-16 (×2): qty 2.5

## 2014-03-16 MED ORDER — SODIUM CHLORIDE 0.9 % IV SOLN
INTRAVENOUS | Status: DC
  Administered 2014-03-16: 1000 mL via INTRAVENOUS
  Administered 2014-03-17: 22:00:00 via INTRAVENOUS
  Administered 2014-03-18: 250 mL via INTRAVENOUS
  Administered 2014-03-19: 10 mL/h via INTRAVENOUS

## 2014-03-16 MED ORDER — SODIUM CHLORIDE 0.9 % IV SOLN
INTRAVENOUS | Status: DC
  Administered 2014-03-16: 250 mL via INTRAVENOUS

## 2014-03-16 MED ORDER — SIMETHICONE 80 MG PO CHEW
80.0000 mg | CHEWABLE_TABLET | Freq: Four times a day (QID) | ORAL | Status: DC | PRN
  Administered 2014-03-16 – 2014-03-18 (×2): 80 mg via ORAL
  Filled 2014-03-16 (×2): qty 1

## 2014-03-16 MED ORDER — SENNA 8.6 MG PO TABS
2.0000 | ORAL_TABLET | Freq: Every day | ORAL | Status: DC
  Administered 2014-03-18 – 2014-03-20 (×3): 17.2 mg via ORAL
  Filled 2014-03-16 (×6): qty 2

## 2014-03-16 MED ORDER — GI COCKTAIL ~~LOC~~
30.0000 mL | Freq: Once | ORAL | Status: AC
  Administered 2014-03-16: 30 mL via ORAL
  Filled 2014-03-16: qty 30

## 2014-03-16 MED ORDER — ALPRAZOLAM 0.25 MG PO TABS
0.2500 mg | ORAL_TABLET | Freq: Two times a day (BID) | ORAL | Status: DC | PRN
  Administered 2014-03-16 – 2014-03-17 (×2): 0.25 mg via ORAL
  Filled 2014-03-16 (×3): qty 1

## 2014-03-16 MED ORDER — PREDNISONE 5 MG PO TABS
5.0000 mg | ORAL_TABLET | Freq: Every day | ORAL | Status: DC
  Administered 2014-03-16 – 2014-03-21 (×6): 5 mg via ORAL
  Filled 2014-03-16 (×6): qty 1

## 2014-03-16 MED ORDER — SODIUM CHLORIDE 0.9 % IV SOLN
1000.0000 mL | INTRAVENOUS | Status: DC
  Administered 2014-03-16: 1000 mL via INTRAVENOUS

## 2014-03-16 MED ORDER — ONDANSETRON HCL 4 MG PO TABS: 4.0000 mg | ORAL_TABLET | Freq: Four times a day (QID) | ORAL | Status: DC | PRN

## 2014-03-16 MED ORDER — CYCLOSPORINE 0.05 % OP EMUL
1.0000 [drp] | Freq: Two times a day (BID) | OPHTHALMIC | Status: DC
  Administered 2014-03-16 – 2014-03-18 (×5): 1 [drp] via OPHTHALMIC
  Administered 2014-03-19: 10:00:00 via OPHTHALMIC
  Administered 2014-03-19 – 2014-03-21 (×4): 1 [drp] via OPHTHALMIC
  Filled 2014-03-16 (×11): qty 1

## 2014-03-16 MED ORDER — ALPRAZOLAM 0.5 MG PO TABS: 0.5000 mg | ORAL_TABLET | Freq: Two times a day (BID) | ORAL | Status: DC | PRN

## 2014-03-16 MED ORDER — ALBUTEROL SULFATE (2.5 MG/3ML) 0.083% IN NEBU
2.5000 mg | INHALATION_SOLUTION | Freq: Four times a day (QID) | RESPIRATORY_TRACT | Status: DC
  Administered 2014-03-16 – 2014-03-18 (×6): 2.5 mg via RESPIRATORY_TRACT
  Filled 2014-03-16 (×7): qty 3

## 2014-03-16 MED ORDER — FLUTICASONE PROPIONATE 50 MCG/ACT NA SUSP
2.0000 | Freq: Every day | NASAL | Status: DC | PRN
  Filled 2014-03-16: qty 16

## 2014-03-16 MED ORDER — FUROSEMIDE 10 MG/ML IJ SOLN
20.0000 mg | Freq: Once | INTRAMUSCULAR | Status: AC
  Administered 2014-03-16: 20 mg via INTRAVENOUS
  Filled 2014-03-16: qty 2

## 2014-03-16 MED ORDER — POLYETHYLENE GLYCOL 3350 17 G PO PACK
17.0000 g | PACK | Freq: Every day | ORAL | Status: DC
  Administered 2014-03-18 – 2014-03-19 (×2): 17 g via ORAL
  Filled 2014-03-16 (×6): qty 1

## 2014-03-16 MED ORDER — ONDANSETRON HCL 4 MG/2ML IJ SOLN
4.0000 mg | Freq: Four times a day (QID) | INTRAMUSCULAR | Status: DC | PRN
Start: 2014-03-16 — End: 2014-03-21
  Administered 2014-03-18: 4 mg via INTRAVENOUS
  Filled 2014-03-16: qty 2

## 2014-03-16 MED ORDER — INSULIN ASPART 100 UNIT/ML ~~LOC~~ SOLN
0.0000 [IU] | Freq: Every day | SUBCUTANEOUS | Status: DC
  Administered 2014-03-16 – 2014-03-19 (×2): 2 [IU] via SUBCUTANEOUS

## 2014-03-16 MED ORDER — INSULIN ASPART 100 UNIT/ML ~~LOC~~ SOLN
0.0000 [IU] | Freq: Three times a day (TID) | SUBCUTANEOUS | Status: DC
  Administered 2014-03-16: 1 [IU] via SUBCUTANEOUS
  Administered 2014-03-17: 3 [IU] via SUBCUTANEOUS
  Administered 2014-03-17 – 2014-03-18 (×4): 2 [IU] via SUBCUTANEOUS
  Administered 2014-03-19: 3 [IU] via SUBCUTANEOUS
  Administered 2014-03-19: 2 [IU] via SUBCUTANEOUS
  Administered 2014-03-19: 1 [IU] via SUBCUTANEOUS
  Administered 2014-03-20 (×2): 3 [IU] via SUBCUTANEOUS
  Administered 2014-03-21: 1 [IU] via SUBCUTANEOUS
  Administered 2014-03-21: 3 [IU] via SUBCUTANEOUS

## 2014-03-16 MED ORDER — ACETAMINOPHEN 325 MG PO TABS
650.0000 mg | ORAL_TABLET | Freq: Four times a day (QID) | ORAL | Status: DC | PRN
  Administered 2014-03-19: 650 mg via ORAL
  Filled 2014-03-16: qty 2

## 2014-03-16 MED ORDER — SODIUM CHLORIDE 0.9 % IV SOLN
80.0000 mg | Freq: Once | INTRAVENOUS | Status: AC
  Administered 2014-03-16: 80 mg via INTRAVENOUS
  Filled 2014-03-16 (×2): qty 80

## 2014-03-16 NOTE — H&P (Addendum)
History and Physical  [name]:[id number] DOB: [birth year] DOA: 03/16/2014  Referring physician: EDP PCP: Leola Brazil, MD  Outpatient Specialists:  1. GI: Dr. Zenovia Jarred. 2. Vascular: Dr. Quay Burow  Chief Complaint: Abdominal distention, heartburn, difficulty breathing, rectal bleeding and black stools  HPI: [name] is a 73 y.o. female with extensive PMH-COPD not on home oxygen, HTN, arthritis on chronic alternate day prednisone, Barrett's esophagus status post stretching 7-8 years ago, GERD, cirrhosis, PAD, DM 2, anxiety, gout, normal nuclear stress test January 2014, presented to the ED secondary to above symptoms. She gives a 7-10 days history of intermittent and progressively worsening abdominal distention, 2-3 loose BMs daily, intermittent bright red rectal bleeding that she attributed to her hemorrhoids, "black tarry stools" 2-3 days ago, dyspnea which she attributes to abdominal distention pressing on her chest, "heartburn" with sensation of choking, difficulty swallowing both solids and liquids, poor appetite, nausea but no vomiting. She does take a baby aspirin and when necessary Aleve for her several arthritic pains. Today she went to use the toilet and started having palpitations and worsening dyspnea which did not subside with her home inhaler. She denies radiation of her "heartburn" pain. In the ED, hemoglobin was 6.2 (compared to 10 a couple months ago), MCV 69, POC troponin 0.11, EKG with mild T wave inversions in lateral leads and chest x-ray without acute findings. Rectal exam by EDP showed brown stools and FOBT +. GI has been consulted by EDP and hospitalist admission requested.   Review of Systems: All systems reviewed and apart from history of presenting illness, are negative.  Past Medical History  Diagnosis Date  . COPD (chronic obstructive pulmonary disease)   . Hypertension   . Asthma   . Psoriatic arthritis   . Psoriasis   .  GERD (gastroesophageal reflux disease)   . Hiatal hernia   . Chronic sinus infection   . Heart murmur   . Blood transfusion ~ 1962    "11 units of blood"  . Ankylosing spondylitis   . Chronic back pain   . Dysrhythmia     "palpitations"  . Esophageal dilatation   . Candida esophagitis   . Cirrhosis of liver without mention of alcohol   . Hypercholesteremia   . Peripheral vascular disease   . Exertional dyspnea   . Pneumonia 10/07/11; 2006; 1980's  . Chronic bronchitis     "since childhood" (12/06/2012)  . Type II diabetes mellitus   . Iron deficiency anemia   . External bleeding hemorrhoids   . Sinus headache   . Arthritis     "psorasic"  . OA (osteoarthritis)   . DJD (degenerative joint disease) of knee     "both" (12/06/2012)  . Carpal tunnel syndrome on right   . Anxiety   . Barrett esophagus   . Non-healing ulcer of foot, secondary to diabetes and PVD 12/07/2012  . PVD (peripheral vascular disease) 12/07/2012  . Tobacco abuse 12/07/2012  . Critical lower limb ischemia, with ulcer Lt foot 12/07/2012  . Gout   . COPD (chronic obstructive pulmonary disease)   . Carotid artery disease     dopplers showed mod L ICA stenosis   . PAD (peripheral artery disease)   . History of nuclear stress test 11/2012    lexiscan; normal, low risk   . Dermatophytosis of nail [id number]   Past Surgical History  Procedure Laterality Date  . Nasal sinus surgery  09/29/2009  . Carpal tunnel release  2011    left hand  . Lipoma excision  2010    "left occipital area"  . Cataract extraction w/ intraocular lens implant  2009/2010    right/left  . Eye surgery    . Oophorectomy      'not sure which one"  . Appendectomy    . Angioplasty  12/10/2012    "LLE"; LSFA Diamondback orbital rotational arthrectomy, PTA , stenting with IDEV stent (left ABI improved from 0.35 to 0.75)  . Abdominal hysterectomy  1976  . Breast biopsy  1960's    "think he did both" (12/06/2012)  . 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 Esophagogastroduodenoscopy  2011  . Colonoscopy  2007    Hemorrhoids and Diverticulosis   Social History:  reports that she has been smoking Cigarettes.  She has a 40 pack-year smoking history. She has never used smokeless tobacco. She reports that she does not drink alcohol or use illicit drugs. Widowed. Lives with her sister. Uses cane or a walker to ambulate.  Allergies  Allergen Reactions  . Amlodipine Besy-Benazepril Hcl Other (See Comments)    Nervous/shakiness  . Statins Other (See Comments)    "can't take any of them; cramp me up" (12/06/2012)  . Plavix [Clopidogrel] Rash    Pt states that the doctor told her that her rash was psoriasis and not related to plavix but she states the rash came from the plavix  . Raptiva [Efalizumab] Rash    Family History  Problem Relation Age of Onset  . Lupus Sister     x2  . Lung cancer Sister   . Thyroid disease Sister   . Colon cancer Neg Hx   . Cancer Mother   . Heart Problems Maternal Grandmother   . Cancer Paternal Grandfather   . Diabetes Brother     x2  . Arthritis Brother     x3  . Arthritis Sister     x3    Prior to Admission medications   Medication Sig Start Date End Date Taking? Authorizing Provider  acetaminophen (TYLENOL) 325 MG tablet Take 2 tablets (650 mg total) by mouth every 6 (six) hours as needed for mild pain (or Fever >/= 101). 12/04/13  Yes Robbie Lis, MD  albuterol (PROVENTIL HFA;VENTOLIN HFA) 108 (90 BASE) MCG/ACT inhaler Inhale 1 puff into the lungs every 6 (six) hours as needed. FOR SHORTNESS OF BREATH   Yes Historical Provider, MD  ALPRAZolam (XANAX) 0.5 MG tablet Take 1 tablet (0.5 mg total) by mouth 2 (two) times daily as needed for anxiety or sleep. 12/04/13  Yes Robbie Lis, MD  ammonium lactate (LAC-HYDRIN) 12 % lotion Apply 1 application topically  as needed for dry skin (psorias).   Yes Historical Provider, MD  aspirin EC 81 MG tablet Take 81 mg by mouth daily.   Yes Historical Provider, MD  Brinzolamide-Brimonidine [facility]) 1-0.2 % SUSP Place 1 drop into the right eye 3 (three) times daily.    Yes Historical Provider, MD  Clobetasol Propionate (TEMOVATE) 0.05 % external spray Apply 1 spray topically 2 (two) times daily as needed. For psoriasis flare-ups   Yes Historical Provider, MD  esomeprazole (NEXIUM) 40 MG capsule Take 40 mg by mouth daily before breakfast.   Yes Historical Provider, MD  feeding supplement, GLUCERNA SHAKE, (GLUCERNA SHAKE) LIQD Take 237 mLs by mouth 2 (two) times daily  between meals. 12/04/13  Yes Robbie Lis, MD  fluticasone [facility]) 50 MCG/ACT nasal spray Place 2 sprays into both nostrils daily as needed for allergies.    Yes Historical Provider, MD  furosemide (LASIX) 40 MG tablet Take 20 mg by mouth daily.  03/04/14  Yes Historical Provider, MD  glimepiride (AMARYL) 2 MG tablet Take 2 mg by mouth daily with breakfast.   Yes Historical Provider, MD  hydrALAZINE (APRESOLINE) 10 MG tablet Take 1 tablet (10 mg total) by mouth every 8 (eight) hours. 12/04/13  Yes Robbie Lis, MD  ipratropium-albuterol (DUONEB) 0.5-2.5 (3) MG/3ML SOLN Take 3 mLs by nebulization every 4 (four) hours as needed (shortness of breath).   Yes Historical Provider, MD  metFORMIN (GLUCOPHAGE) 500 MG tablet Take 500 mg by mouth 2 (two) times daily with a meal.   Yes Historical Provider, MD  Multiple Vitamins-Minerals (EYE VITAMINS) CAPS Take 1 capsule by mouth daily.   Yes Historical Provider, MD  naproxen sodium (ANAPROX) 220 MG tablet Take 220 mg by mouth daily as needed (pain).   Yes Historical Provider, MD  ONE TOUCH ULTRA TEST test strip  03/10/14  Yes Historical Provider, MD  predniSONE (DELTASONE) 5 MG tablet Take 5 mg by mouth daily. On Monday, Wednesday, Friday    Yes Historical Provider, MD  ranitidine (ZANTAC) 150 MG capsule Take 150 mg  by mouth at bedtime.     Yes Historical Provider, MD  RESTASIS 0.05 % ophthalmic emulsion Place 1 drop into both eyes 2 (two) times daily.  03/07/14  Yes Historical Provider, MD  senna (SENOKOT) 8.6 MG TABS tablet Take 1 tablet (8.6 mg total) by mouth 2 (two) times daily. 12/04/13  Yes Robbie Lis, MD  simethicone (MYLICON) 80 MG chewable tablet Chew 1 tablet (80 mg total) by mouth 4 (four) times daily as needed for flatulence. 12/04/13  Yes Robbie Lis, MD  SYMBICORT 160-4.5 MCG/ACT inhaler Inhale 2 puffs into the lungs every evening.  02/27/14  Yes Historical Provider, MD  tiotropium (SPIRIVA) 18 MCG inhalation capsule Place 1 capsule (18 mcg total) into inhaler and inhale daily. 12/04/13  Yes Robbie Lis, MD  Travoprost, BAK Free, (TRAVATAMN) 0.004 % SOLN ophthalmic solution Place 1 drop into both eyes at bedtime.    Yes Historical Provider, MD   Physical Exam: Filed Vitals:   03/16/14 1330 03/16/14 1400 03/16/14 1430 03/16/14 1500  BP: 132/63 122/79 132/40 136/62  Pulse:  84 79 77  Temp:      TempSrc:      Resp: 19 17 18 21   Height:      Weight:      SpO2:  93% 97% 93%     General exam: Moderately built and nourished female patient, lying propped up on the gurney in minimal respiratory distress with activity.   Head, eyes and ENT: Nontraumatic and normocephalic. Pupils equally reacting to light and accommodation. Oral mucosa moist.  Neck: Supple. No JVD, carotid bruit or thyromegaly.  Lymphatics: No lymphadenopathy.  Respiratory system: Occasional basal crackles but otherwise clear to auscultation. No increased work of breathing.  Cardiovascular system: S1 and S2 heard, RRR. No JVD, murmurs, gallops, clicks. Trace bilateral ankle pedal edema.  Gastrointestinal system: Abdomen is moderately distended, soft and nontender. Normal bowel sounds heard. No organomegaly or masses appreciated.? Ascites versus tympanitic.  Central nervous system: Alert and oriented. No focal neurological  deficits.  Extremities: Symmetric 5 x 5 power. Left forefoot amputation. Hyperpigmentation of skin of bilateral  shins he had  Skin: No rashes or acute findings.  Musculoskeletal system: Negative exam.  Psychiatry: Pleasant and cooperative.   Labs on Admission:  Basic Metabolic Panel:  Recent Labs Lab 03/16/14 1243  NA 138  K 4.6  CL 103  CO2 22  GLUCOSE 210*  BUN 14  CREATININE 0.94  CALCIUM 9.7   Liver Function Tests:  Recent Labs Lab 03/16/14 1243  AST 20  ALT 21  ALKPHOS 88  BILITOT 0.2*  PROT 6.6  ALBUMIN 3.2*    Recent Labs Lab 03/16/14 1243  LIPASE 21   No results found for this basename: AMMONIA,  in the last 168 hours CBC:  Recent Labs Lab 03/16/14 1243  WBC 10.5  HGB 6.2*  HCT 22.9*  MCV 68.6*  PLT 291   Cardiac Enzymes: No results found for this basename: CKTOTAL, CKMB, CKMBINDEX, TROPONINI,  in the last 168 hours  BNP (last 3 results) No results found for this basename: PROBNP,  in the last 8760 hours CBG: No results found for this basename: GLUCAP,  in the last 168 hours  Radiological Exams on Admission: Dg Chest Portable 1 View  03/16/2014   CLINICAL DATA:  Shortness of breath. Panic attack. COPD. Exertional asthma.  EXAM: PORTABLE CHEST - 1 VIEW  COMPARISON:  12/01/2013  FINDINGS: Mild to moderate cardiomegaly is stable. Both lungs remain clear. No evidence of pleural effusion. Mild tortuosity of thoracic aorta appears stable.  IMPRESSION: Stable cardiomegaly.  No acute findings.   Electronically Signed   By: Earle Gell M.D.   On: 03/16/2014 12:05    EKG: Independently reviewed. Sinus rhythm at 76 beats per minute, normal axis, T wave inversions in lateral leads but no other acute findings.  Assessment/Plan Principal Problem:   GI bleeding Active Problems:   DIABETES-TYPE 2   HYPERTENSION   GERD   Psoriatic arthritis   Barrett esophagus   Acute post-hemorrhagic anemia   Abdominal distention   Chest pain, atypical    Elevated troponin   COPD (chronic obstructive pulmonary disease)   GI bleed   1. GI bleed: Suspect both upper (melena) and lower (BRBPR): Upper GI bleed DD-gastritis (ASA/NSAID), esophagitis (reflux/candidal), PUD versus other etiology. Cirrhosis has been listed in in her PMH-variceal bleed possible. Lower GI bleed DD-hemorrhoidal versus diverticular versus other etiology. Admit to step down. Clear liquid diet. IV PPI infusion. GI consulted by EDP and may warrant EGD to evaluate GI bleed and Barrett's esophagus +/- colonoscopy. 2. Acute post hemorrhagic anemia complicating underlying chronic iron deficiency anemia: Secondary to GI bleed. Agree with transfusing 2 units of PRBCs under Lasix cover. Monitor CBCs closely. 3. Abdominal distention: Unclear etiology. Check a bladder scan, KUB.? Abdominal ultrasound for ascites-await GI input. 4. Elevated troponins: No acute changes on EKG. Chest pain is atypical. May be secondary to demand ischemia from severe anemia. Cycle troponins. Check 2-D echo. Nuclear stress test in January 2014 was low risk. If troponins continue to rise or patient has persistent symptoms-consult cardiology. Unable to use aspirin or heparin secondary to GI bleed. 5. Uncontrolled type II DM: SSI. 6. Hypertension: Controlled 7. History of Barrett's esophagus/GERD: Heartburn symptoms may be secondary to this. Await GI input. PPI. Clear liquids for now. 8. COPD: No clinical bronchospasm on exam. Chest x-ray negative. Current dyspnea may be multifactorial from COPD, abdominal distention and anemia. Oxygen, bronchodilator nebulizations and treat underlying cause. 9. Psoriatic arthritis on chronic prednisone: Increase prednisone to daily.    Code Status: Full  Family  Communication: None at bedside  Disposition Plan: Admit to step down. May need 3-4 days hospital stay.   Time spent: 60 minutes  Modena Jansky, MD, FACP, [facility]. [facility] Pager [phone]  If 7PM-7AM, please  contact night-coverage [email or web address] Password [email or web address] 03/16/2014, 4:05 PM

## 2014-03-16 NOTE — Plan of Care (Signed)
TNI has bumped from 0.19 to 0.38. Admit with significant anemia hgb 6.2 and in process of receiving 1/2 units PRBCs. Pt with reflux type sxs (known GERD/Barretts esophagitis)-has orders for mylanta and mylicon ordered. Discussed with RN to run blood no faster than 3-4 hrs duration to minimize CHF risk. Now on oxygen. Will check CBC after all units infused. Admit EKG reviewed.  604 am: Post tx 2 units PRBCs now BP up from 135/62 to 155/77 and RN reports pt with increased WOB so will give Lasix 40 mg IV x 1 dose. Hgb has increased from 6.2 to 9.1 after transfusions  Erin Hearing, ANP

## 2014-03-16 NOTE — Consult Note (Signed)
Consult for [name]  Reason for Consult: Anemia, Heme positive stool, and Chest pain Referring Physician: [facility]  [name] HPI: This is a 72 year old female with a PMH of Candidal esophagitis, reflux esophagitis, and hiatal hernia who is admitted for complaints of chest and throat discomfort.  She reports that her symptoms have been ongoing for the past several weeks and she denies any improvement with the use of antacids.  No issues with vomiting.  On 04/2010 and 10/2006 her EGDs revealed Candidal esophagitis and a 4 cm hiatal hernia.  During her admission work up she was identified to have an anemia in the 6 range and heme positive stool.  She reports intermittent issues with melena that is associated with hematochezia.  The patient attributes the hematochezia to her hemorrhoids.  Dr. Patterson did perform a colonoscopy in the past, but I do not have the report available.  She also complains of abdominal distension and in the past she was identified to have a distended bladder that resulted in her symptoms.  Once her bladder distension was relieved she was able to breathe comfortably.  Past Medical History  Diagnosis Date  . COPD (chronic obstructive pulmonary disease)   . Hypertension   . Asthma   . Psoriatic arthritis   . Psoriasis   . GERD (gastroesophageal reflux disease)   . Hiatal hernia   . Chronic sinus infection   . Heart murmur   . Blood transfusion ~ 1962    "11 units of blood"  . Ankylosing spondylitis   . Chronic back pain   . Dysrhythmia     "palpitations"  . Esophageal dilatation   . Candida esophagitis   . Cirrhosis of liver without mention of alcohol   . Hypercholesteremia   . Peripheral vascular disease   . Exertional dyspnea   . Pneumonia 10/07/11; 2006; 1980's  . Chronic bronchitis     "since childhood" (12/06/2012)  . Type II diabetes mellitus   . Iron deficiency anemia   . External bleeding hemorrhoids   . Sinus headache   . Arthritis      "psorasic"  . OA (osteoarthritis)   . DJD (degenerative joint disease) of knee     "both" (12/06/2012)  . Carpal tunnel syndrome on right   . Anxiety   . Barrett esophagus   . Non-healing ulcer of foot, secondary to diabetes and PVD 12/07/2012  . PVD (peripheral vascular disease) 12/07/2012  . Tobacco abuse 12/07/2012  . Critical lower limb ischemia, with ulcer Lt foot 12/07/2012  . Gout   . COPD (chronic obstructive pulmonary disease)   . Carotid artery disease     dopplers showed mod L ICA stenosis   . PAD (peripheral artery disease)   . History of nuclear stress test 11/2012    lexiscan; normal, low risk   . Dermatophytosis of nail [id number]    Past Surgical History  Procedure Laterality Date  . Nasal sinus surgery  09/29/2009  . Carpal tunnel release  2011    left hand  . Lipoma excision  2010    "left occipital area"  . Cataract extraction w/ intraocular lens implant  2009/2010    right/left  . Eye surgery    . Oophorectomy      'not sure which one"  . Appendectomy    . Angioplasty  12/10/2012    "LLE"; LSFA Diamondback orbital rotational arthrectomy, PTA , stenting with IDEV stent (left ABI improved from 0.35 to 0.75)  .   Abdominal hysterectomy  1976  . Breast biopsy  1960's    "think he did both" (12/06/2012)  . 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 Esophagogastroduodenoscopy  2011  . Colonoscopy  2007    Hemorrhoids and Diverticulosis    Family History  Problem Relation Age of Onset  . Lupus Sister     x2  . Lung cancer Sister   . Thyroid disease Sister   . Colon cancer Neg Hx   . Cancer Mother   . Heart Problems Maternal Grandmother   . Cancer Paternal Grandfather   . Diabetes Brother     x2  . Arthritis Brother     x3  . Arthritis Sister     x3    Social History:  reports that she has been smoking Cigarettes.   She has a 40 pack-year smoking history. She has never used smokeless tobacco. She reports that she does not drink alcohol or use illicit drugs.  Allergies:  Allergies  Allergen Reactions  . Amlodipine Besy-Benazepril Hcl Other (See Comments)    Nervous/shakiness  . Statins Other (See Comments)    "can't take any of them; cramp me up" (12/06/2012)  . Plavix [Clopidogrel] Rash    Pt states that the doctor told her that her rash was psoriasis and not related to plavix but she states the rash came from the plavix  . Raptiva [Efalizumab] Rash    Medications:  Scheduled: . albuterol  2.5 mg Nebulization Q6H  . budesonide-formoterol  2 puff Inhalation QPM  . cycloSPORINE  1 drop Both Eyes BID  . furosemide  20 mg Intravenous Once  . insulin aspart  0-5 Units Subcutaneous QHS  . insulin aspart  0-9 Units Subcutaneous TID WC  . latanoprost  1 drop Both Eyes QHS  . pantoprazole (PROTONIX) IV  80 mg Intravenous Once  . [START ON 03/20/2014] pantoprazole (PROTONIX) IV  40 mg Intravenous Q12H  . predniSONE  5 mg Oral Daily  . sodium chloride  3 mL Intravenous Q12H  . tiotropium  18 mcg Inhalation Daily   Continuous: . sodium chloride    . pantoprozole (PROTONIX) infusion      Results for orders placed during the hospital encounter of 03/16/14 (from the past 24 hour(s))  APTT     Status: None   Collection Time    03/16/14 12:43 PM      Result Value Ref Range   aPTT 27  24 - 37 seconds  CBC     Status: Abnormal   Collection Time    03/16/14 12:43 PM      Result Value Ref Range   WBC 10.5  4.0 - 10.5 K/uL   RBC 3.34 (*) 3.87 - 5.11 MIL/uL   Hemoglobin 6.2 (*) 12.0 - 15.0 g/dL   HCT 22.9 (*) 36.0 - 46.0 %   MCV 68.6 (*) 78.0 - 100.0 fL   MCH 18.6 (*) 26.0 - 34.0 pg   MCHC 27.1 (*) 30.0 - 36.0 g/dL   RDW 20.4 (*) 11.5 - 15.5 %   Platelets 291  150 - 400 K/uL  COMPREHENSIVE METABOLIC PANEL     Status: Abnormal   Collection Time    03/16/14 12:43 PM      Result Value Ref Range    Sodium 138  137 - 147 mEq/L   Potassium 4.6  3.7 - 5.3  mEq/L   Chloride 103  96 - 112 mEq/L   CO2 22  19 - 32 mEq/L   Glucose, Bld 210 (*) 70 - 99 mg/dL   BUN 14  6 - 23 mg/dL   Creatinine, Ser 0.94  0.50 - 1.10 mg/dL   Calcium 9.7  8.4 - 10.5 mg/dL   Total Protein 6.6  6.0 - 8.3 g/dL   Albumin 3.2 (*) 3.5 - 5.2 g/dL   AST 20  0 - 37 U/L   ALT 21  0 - 35 U/L   Alkaline Phosphatase 88  39 - 117 U/L   Total Bilirubin 0.2 (*) 0.3 - 1.2 mg/dL   GFR calc non Af Amer 59 (*) >90 mL/min   GFR calc Af Amer 68 (*) >90 mL/min  PROTIME-INR     Status: None   Collection Time    03/16/14 12:43 PM      Result Value Ref Range   Prothrombin Time 14.8  11.6 - 15.2 seconds   INR 1.19  0.00 - 1.49  LIPASE, BLOOD     Status: None   Collection Time    03/16/14 12:43 PM      Result Value Ref Range   Lipase 21  11 - 59 U/L  I-STAT TROPOININ, ED     Status: Abnormal   Collection Time    03/16/14  1:06 PM      Result Value Ref Range   Troponin i, poc 0.16 (*) 0.00 - 0.08 ng/mL   Comment NOTIFIED PHYSICIAN     Comment 3           TYPE AND SCREEN     Status: None   Collection Time    03/16/14  1:56 PM      Result Value Ref Range   ABO/RH(D) A NEG     Antibody Screen NEG     Sample Expiration 03/19/2014    POC OCCULT BLOOD, ED     Status: Abnormal   Collection Time    03/16/14  2:14 PM      Result Value Ref Range   Fecal Occult Bld POSITIVE (*) NEGATIVE     Dg Chest Portable 1 View  03/16/2014   CLINICAL DATA:  Shortness of breath. Panic attack. COPD. Exertional asthma.  EXAM: PORTABLE CHEST - 1 VIEW  COMPARISON:  12/01/2013  FINDINGS: Mild to moderate cardiomegaly is stable. Both lungs remain clear. No evidence of pleural effusion. Mild tortuosity of thoracic aorta appears stable.  IMPRESSION: Stable cardiomegaly.  No acute findings.   Electronically Signed   By: John  Stahl M.D.   On: 03/16/2014 12:05    ROS:  As stated above in the HPI otherwise negative.  Blood pressure 136/62, pulse 77,  temperature 98.3 F (36.8 C), temperature source Oral, resp. rate 21, height 5' 4" (1.626 m), weight 167 lb (75.751 kg), SpO2 93.00%.    PE: Gen: NAD, Alert and Oriented HEENT:  [place]/AT, EOMI Neck: Supple, no LAD Lungs: CTA Bilaterally CV: RRR without M/G/R ABM: Soft, mild epigastric tenderness, distended versus obesity, +BS Ext: No C/C/E  Assessment/Plan: 1) Chest pain. 2) History of Candidal esophagitis - she is on steroid inhalers. 3) Hiatal hernia.  Plan: 1) EGD tomorrow with Dr. Perry. 2) Follow HGB and transfuse as necessary. Velicia Dejager D Aundre Hietala 03/16/2014, 3:30 PM      

## 2014-03-16 NOTE — Progress Notes (Signed)
CRITICAL VALUE ALERT  Critical value received:  Troponin 0.38  Date of notification:  03/15/14  Time of notification:  Unsure  Critical value read back:yes  Nurse who received alert:  Staff 2C received critical value, pt nurse informed at approximately 1920.  MD notified (1st page):  Lissa Merlin  Time of first page:  1940  MD notified (2nd page):  Time of second page:  Responding MD:   Time MD responded:

## 2014-03-16 NOTE — ED Notes (Signed)
MD at bedside. 

## 2014-03-16 NOTE — Progress Notes (Signed)
Nurse contacted blood bank to question when blood would be available.  Nurse was told that during 1300; communication was given to ED that blood was ready.  ED did not communicate that blood bank verified blood prepared.  2C nurse will obtain consent and begin transfusion as ordered.

## 2014-03-16 NOTE — Progress Notes (Signed)
Pt experienced episode of SOB with exertion.  Pt lung sounds were unchanged since assessment upon arrival to the floor.   Respiratory contacted for PRN treatment, nurse remained with pt at bedside. Nurse contacted MD to inform that pt RR remained 20-30's post treatment.

## 2014-03-16 NOTE — ED Notes (Signed)
Results of troponin given to primary nurse Velna Hatchet

## 2014-03-16 NOTE — ED Notes (Signed)
EDP made aware of Critical Value

## 2014-03-16 NOTE — ED Notes (Signed)
MD made aware of critical lab value.

## 2014-03-16 NOTE — ED Notes (Signed)
73 yo via [facility] from home c/o panick attack and throat discomfort while trying to get dressed this am. Pt. Took 1 puff of albuterol inhaler w/ no relief. HX of esophageal varices. Exertional asthma, COPD, GERD and DM. Per EMS 12 [name] SR unremarkable. Discomfort in epigastric area.

## 2014-03-16 NOTE — ED Provider Notes (Signed)
CSN: [place]:[id number]     Arrival date & time 03/16/14  1048 History  First MD Initiated Contact with Patient 03/16/14 1052     Chief Complaint  Patient presents with  . Panic Attack  . Asthma    HPI Patient presents to the emergency room with complaints of chest and throat discomfort. She states the episodes have been going on for at least the last several weeks. She has tried antacid medications. Nothing seems to make it particularly better or worse Associated with a fullness sensation in her chest. She denies any specific pain. She denies any trouble with vomiting or diarrhea. Patient states she's had this trouble in the past and was told was related to her Barrett's esophagus and fungal infection. Patient is not having any vomiting or diarrhea. She's not had any trouble with fevers or chills   Past Surgical History  Procedure Laterality Date  . Nasal sinus surgery  09/29/2009  . Carpal tunnel release  2011    left hand  . Lipoma excision  2010    "left occipital area"  . Cataract extraction w/ intraocular lens implant  2009/2010    right/left  . Eye surgery    . Oophorectomy      'not sure which one"  . Appendectomy    . Angioplasty  12/10/2012    "LLE"; LSFA [place] orbital rotational arthrectomy, PTA , stenting with IDEV stent (left ABI improved from 0.35 to 0.75)  . Abdominal hysterectomy  1976  . Breast biopsy  1960's    "think he did both" (12/06/2012)  . 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 Esophagogastroduodenoscopy  2011  . Colonoscopy  2007    Hemorrhoids and Diverticulosis   Family History  Problem Relation Age of Onset  . Lupus Sister     x2  . Lung cancer Sister   . Thyroid disease Sister   . Colon cancer Neg Hx   . Cancer Mother   . Heart Problems Maternal Grandmother   . Cancer Paternal Grandfather   . Diabetes Brother      x2  . Arthritis Brother     x3  . Arthritis Sister     x3   History  Substance Use Topics  . Smoking status: Current Some Day Smoker -- 1.00 packs/day for 40 years    Types: Cigarettes  . Smokeless tobacco: Never Used     Comment: pt states that she smokes "every once in a while"  . Alcohol Use: No     Comment: "quit social drinking years ago"   OB History   Grav Para Term Preterm Abortions TAB SAB Ect Mult Living                 Review of Systems  All other systems reviewed and are negative.     Allergies  Amlodipine besy-benazepril hcl; Statins; Plavix; and Raptiva  Home Medications   Prior to Admission medications   Medication Sig Start Date End Date Taking? Authorizing Provider  acetaminophen (TYLENOL) 325 MG tablet Take 2 tablets (650 mg total) by mouth every 6 (six) hours as needed for mild pain (or Fever >/= 101). 12/04/13   Robbie Lis, MD  albuterol (PROVENTIL HFA;VENTOLIN HFA) 108 (90 BASE) MCG/ACT inhaler Inhale 1 puff into the lungs every 6 (six) hours as needed. FOR SHORTNESS OF BREATH    Historical Provider,  MD  ALPRAZolam (XANAX) 0.5 MG tablet Take 1 tablet (0.5 mg total) by mouth 2 (two) times daily as needed for anxiety or sleep. 12/04/13   Robbie Lis, MD  ammonium lactate (LAC-HYDRIN) 12 % lotion Apply 1 application topically as needed for dry skin (psorias).    Historical Provider, MD  aspirin EC 81 MG tablet Take 81 mg by mouth daily.    Historical Provider, MD  Brinzolamide-Brimonidine [facility]) 1-0.2 % SUSP Apply 1 drop to eye 3 (three) times daily.    Historical Provider, MD  Clobetasol Propionate (TEMOVATE) 0.05 % external spray Apply 1 spray topically 2 (two) times daily as needed. For psoriasis flare-ups    Historical Provider, MD  esomeprazole (NEXIUM) 40 MG capsule Take 40 mg by mouth daily before breakfast.    Historical Provider, MD  feeding supplement, GLUCERNA SHAKE, (GLUCERNA SHAKE) LIQD Take 237 mLs by mouth 2 (two) times daily  between meals. 12/04/13   Robbie Lis, MD  fluticasone (FLONASE) 50 MCG/ACT nasal spray Place 2 sprays into both nostrils daily.    Historical Provider, MD  furosemide (LASIX) 40 MG tablet  03/04/14   Historical Provider, MD  glimepiride (AMARYL) 2 MG tablet Take 2 mg by mouth daily with breakfast.    Historical Provider, MD  hydrALAZINE (APRESOLINE) 10 MG tablet Take 1 tablet (10 mg total) by mouth every 8 (eight) hours. 12/04/13   Robbie Lis, MD  ipratropium-albuterol (DUONEB) 0.5-2.5 (3) MG/3ML SOLN Take 3 mLs by nebulization every 4 (four) hours as needed (shortness of breath).    Historical Provider, MD  metFORMIN (GLUCOPHAGE) 500 MG tablet Take 500 mg by mouth 2 (two) times daily with a meal.    Historical Provider, MD  Multiple Vitamins-Minerals (EYE VITAMINS) CAPS Take 1 capsule by mouth daily.    Historical Provider, MD  ONE TOUCH ULTRA TEST test strip  03/10/14   Historical Provider, MD  predniSONE (DELTASONE) 5 MG tablet Take 5 mg by mouth daily. On Monday, Wednesday, Friday     Historical Provider, MD  ranitidine (ZANTAC) 150 MG capsule Take 150 mg by mouth at bedtime.      Historical Provider, MD  RESTASIS 0.05 % ophthalmic emulsion  03/07/14   Historical Provider, MD  senna (SENOKOT) 8.6 MG TABS tablet Take 1 tablet (8.6 mg total) by mouth 2 (two) times daily. 12/04/13   Robbie Lis, MD  simethicone (MYLICON) 80 MG chewable tablet Chew 1 tablet (80 mg total) by mouth 4 (four) times daily as needed for flatulence. 12/04/13   Robbie Lis, MD  SYMBICORT 160-4.5 MCG/ACT inhaler  02/27/14   Historical Provider, MD  tiotropium (SPIRIVA) 18 MCG inhalation capsule Place 1 capsule (18 mcg total) into inhaler and inhale daily. 12/04/13   Robbie Lis, MD  Travoprost, BAK Free, (TRAVATAMN) 0.004 % SOLN ophthalmic solution Place 1 drop into both eyes at bedtime.     Historical Provider, MD   BP 132/63  Pulse 74  Temp(Src) 98.3 F (36.8 C) (Oral)  Resp 19  Ht 5\' 4"  (1.626 m)  Wt 167 lb (75.751 kg)   BMI 28.65 kg/m2  SpO2 99% Physical Exam  Nursing note and vitals reviewed. Constitutional: She appears well-developed and well-nourished. No distress.  HENT:  Head: Normocephalic and atraumatic.  Right Ear: External ear normal.  Left Ear: External ear normal.  Mouth/Throat: No oropharyngeal exudate.  Eyes: Conjunctivae are normal. Right eye exhibits no discharge. Left eye exhibits no discharge. No scleral icterus.  Neck: Neck supple. No tracheal deviation present.  Cardiovascular: Normal rate, regular rhythm and intact distal pulses.   Pulmonary/Chest: Effort normal and breath sounds normal. No stridor. No respiratory distress. She has no wheezes. She has no rales.  Abdominal: Soft. Bowel sounds are normal. She exhibits no distension. There is no tenderness. There is no rebound and no guarding.  Musculoskeletal: She exhibits no edema and no tenderness.  Neurological: She is alert. She has normal strength. No cranial nerve deficit (no facial droop, extraocular movements intact, no slurred speech) or sensory deficit. She exhibits normal muscle tone. She displays no seizure activity. Coordination normal.  Skin: Skin is warm and dry. No rash noted.  Psychiatric: She has a normal mood and affect.    ED Course  Procedures (including critical care time) CRITICAL CARE Performed by: Kathalene Frames Total critical care time: 35 Critical care time was exclusive of separately billable procedures and treating other patients. Critical care was necessary to treat or prevent imminent or life-threatening deterioration. Critical care was time spent personally by me on the following activities: development of treatment plan with patient and/or surrogate as well as nursing, discussions with consultants, evaluation of patient's response to treatment, examination of patient, obtaining history from patient or surrogate, ordering and performing treatments and interventions, ordering and review of laboratory  studies, ordering and review of radiographic studies, pulse oximetry and re-evaluation of patient's condition.  Labs Review Labs Reviewed  CBC - Abnormal; Notable for the following:    RBC 3.34 (*)    Hemoglobin 6.2 (*)    HCT 22.9 (*)    MCV 68.6 (*)    MCH 18.6 (*)    MCHC 27.1 (*)    RDW 20.4 (*)    All other components within normal limits  COMPREHENSIVE METABOLIC PANEL - Abnormal; Notable for the following:    Glucose, Bld 210 (*)    Albumin 3.2 (*)    Total Bilirubin 0.2 (*)    GFR calc non Af Amer 59 (*)    GFR calc Af Amer 68 (*)    All other components within normal limits  I-STAT TROPOININ, ED - Abnormal; Notable for the following:    Troponin i, poc 0.16 (*)    All other components within normal limits  POC OCCULT BLOOD, ED - Abnormal; Notable for the following:    Fecal Occult Bld POSITIVE (*)    All other components within normal limits  APTT  PROTIME-INR  LIPASE, BLOOD  I-STAT TROPOININ, ED  TYPE AND SCREEN    Imaging Review Dg Chest Portable 1 View  03/16/2014   CLINICAL DATA:  Shortness of breath. Panic attack. COPD. Exertional asthma.  EXAM: PORTABLE CHEST - 1 VIEW  COMPARISON:  12/01/2013  FINDINGS: Mild to moderate cardiomegaly is stable. Both lungs remain clear. No evidence of pleural effusion. Mild tortuosity of thoracic aorta appears stable.  IMPRESSION: Stable cardiomegaly.  No acute findings.   Electronically Signed   By: Earle Gell M.D.   On: 03/16/2014 12:05     EKG Interpretation   Date/Time:  Sunday March 16 2014 11:46:14 EDT Ventricular Rate:  76 PR Interval:  136 QRS Duration: 81 QT Interval:  405 QTC Calculation: 455 R Axis:   38 Text Interpretation:  Sinus rhythm Repol abnrm suggests ischemia, lateral  leads No significant change since last tracing Confirmed by Breena Bevacqua  MD-J,  Reena Borromeo ([id number]) on 03/16/2014 12:00:22 PM     Medications  0.9 %  sodium chloride infusion (1,000  mLs Intravenous New Bag/Given 03/16/14 1228)  gi cocktail  (Maalox,Lidocaine,Donnatal) (30 mLs Oral Given 03/16/14 1204)   1415  Blood transfusion has been ordered for her symptomatic anemia.  Pt remains stable MDM   Final diagnoses:  Anemia  Cardiac enzymes elevated  Barrett esophagus  GI bleeding    Pt has worsening anemia, significantly change her baseline. As the patient additional questions about rectal bleeding. Patient has noticed some blood in her stool and darker stools.  She is guaiac positive in the emergency department. There is no bright red blood in the stool. I have ordered blood transfusions.  Patient's not currently having any chest discomfort however she does have elevated troponin. Her EKG is not suggesting acute ischemia. It is possible that she could be having some command ischemia associated with her anemia. We'll continue to monitor closely. Again blood transfusion has been ordered  Plan is to admit for further treatment and evaluation.  Discussed case with Dr Janeann Merl.    Consulted with Dr Benson Norway who is covering for [facility].  He will see patient in the hospital.     Kathalene Frames, MD 03/16/14 [phone]

## 2014-03-17 ENCOUNTER — Inpatient Hospital Stay (HOSPITAL_COMMUNITY): Payer: Medicare Other | Admitting: Certified Registered Nurse Anesthetist

## 2014-03-17 ENCOUNTER — Encounter (HOSPITAL_COMMUNITY)
Admission: EM | Disposition: A | Discharge status: Home Health | Payer: Self-pay | Source: Home / Self Care | Attending: Internal Medicine

## 2014-03-17 ENCOUNTER — Encounter (HOSPITAL_COMMUNITY): Payer: Self-pay

## 2014-03-17 ENCOUNTER — Encounter (HOSPITAL_COMMUNITY): Payer: Medicare Other | Admitting: Certified Registered Nurse Anesthetist

## 2014-03-17 DIAGNOSIS — D649 Anemia, unspecified: Secondary | ICD-10-CM

## 2014-03-17 DIAGNOSIS — D509 Iron deficiency anemia, unspecified: Secondary | ICD-10-CM

## 2014-03-17 DIAGNOSIS — I319 Disease of pericardium, unspecified: Secondary | ICD-10-CM

## 2014-03-17 HISTORY — PX: ESOPHAGOGASTRODUODENOSCOPY: SHX5428

## 2014-03-17 LAB — CBC
HEMATOCRIT: 31 % — AB (ref 36.0–46.0)
HEMATOCRIT: 29.5 % — AB (ref 36.0–46.0)
HEMOGLOBIN: 9.1 g/dL — AB (ref 12.0–15.0)
Hemoglobin: 8.8 g/dL — ABNORMAL LOW (ref 12.0–15.0)
MCH: 21.3 pg — ABNORMAL LOW (ref 26.0–34.0)
MCH: 21.6 pg — ABNORMAL LOW (ref 26.0–34.0)
MCHC: 29.4 g/dL — AB (ref 30.0–36.0)
MCHC: 29.8 g/dL — ABNORMAL LOW (ref 30.0–36.0)
MCV: 72.6 fL — ABNORMAL LOW (ref 78.0–100.0)
MCV: 72.5 fL — ABNORMAL LOW (ref 78.0–100.0)
Platelets: 290 10*3/uL (ref 150–400)
Platelets: 277 10*3/uL (ref 150–400)
RBC: 4.27 MIL/uL (ref 3.87–5.11)
RBC: 4.07 MIL/uL (ref 3.87–5.11)
RDW: 22.3 % — ABNORMAL HIGH (ref 11.5–15.5)
RDW: 22.1 % — ABNORMAL HIGH (ref 11.5–15.5)
WBC: 11.3 10*3/uL — ABNORMAL HIGH (ref 4.0–10.5)
WBC: 11.2 10*3/uL — ABNORMAL HIGH (ref 4.0–10.5)

## 2014-03-17 LAB — BASIC METABOLIC PANEL
BUN: 12 mg/dL (ref 6–23)
CO2: 23 mEq/L (ref 19–32)
Calcium: 9.8 mg/dL (ref 8.4–10.5)
Chloride: 102 mEq/L (ref 96–112)
Creatinine, Ser: 0.96 mg/dL (ref 0.50–1.10)
GFR calc Af Amer: 66 mL/min — ABNORMAL LOW (ref >90)
GFR calc non Af Amer: 57 mL/min — ABNORMAL LOW (ref >90)
Glucose, Bld: 165 mg/dL — ABNORMAL HIGH (ref 70–99)
Potassium: 5 mEq/L (ref 3.7–5.3)
Sodium: 138 mEq/L (ref 137–147)

## 2014-03-17 LAB — GLUCOSE, CAPILLARY
GLUCOSE-CAPILLARY: 167 mg/dL — AB (ref 70–99)
GLUCOSE-CAPILLARY: 230 mg/dL — AB (ref 70–99)
Glucose-Capillary: 150 mg/dL — ABNORMAL HIGH (ref 70–99)
Glucose-Capillary: 192 mg/dL — ABNORMAL HIGH (ref 70–99)

## 2014-03-17 LAB — TYPE AND SCREEN
ABO/RH(D): A NEG
Antibody Screen: NEGATIVE
UNIT DIVISION: 0
Unit division: 0

## 2014-03-17 LAB — TROPONIN I
Troponin I: 0.51 ng/mL (ref <0.30)
Troponin I: 0.47 ng/mL (ref <0.30)
Troponin I: 0.36 ng/mL (ref <0.30)

## 2014-03-17 SURGERY — EGD (ESOPHAGOGASTRODUODENOSCOPY)
Anesthesia: Monitor Anesthesia Care

## 2014-03-17 MED ORDER — OXYCODONE HCL 5 MG/5ML PO SOLN: 5.0000 mg | Freq: Once | ORAL | Status: AC | PRN

## 2014-03-17 MED ORDER — PROPOFOL 10 MG/ML IV BOLUS
INTRAVENOUS | Status: DC | PRN
  Administered 2014-03-17: 30 mg via INTRAVENOUS
  Administered 2014-03-17: 10 mg via INTRAVENOUS
  Administered 2014-03-17 (×4): 20 mg via INTRAVENOUS

## 2014-03-17 MED ORDER — PROMETHAZINE HCL 25 MG/ML IJ SOLN: 6.2500 mg | INTRAMUSCULAR | Status: DC | PRN

## 2014-03-17 MED ORDER — ALBUTEROL SULFATE (2.5 MG/3ML) 0.083% IN NEBU
INHALATION_SOLUTION | RESPIRATORY_TRACT | Status: AC
Start: 2014-03-17 — End: 2014-03-17
  Filled 2014-03-17: qty 3

## 2014-03-17 MED ORDER — SODIUM CHLORIDE 0.9 % IV SOLN
INTRAVENOUS | Status: DC | PRN
  Administered 2014-03-17: 12:00:00 via INTRAVENOUS

## 2014-03-17 MED ORDER — HYDROMORPHONE HCL PF 1 MG/ML IJ SOLN: 0.2500 mg | INTRAMUSCULAR | Status: DC | PRN

## 2014-03-17 MED ORDER — BUTAMBEN-TETRACAINE-BENZOCAINE 2-2-14 % EX AERO
INHALATION_SPRAY | CUTANEOUS | Status: DC | PRN
  Administered 2014-03-17: 2 via TOPICAL

## 2014-03-17 MED ORDER — OXYCODONE HCL 5 MG PO TABS: 5.0000 mg | ORAL_TABLET | Freq: Once | ORAL | Status: AC | PRN

## 2014-03-17 MED ORDER — METOPROLOL TARTRATE 1 MG/ML IV SOLN
5.0000 mg | Freq: Four times a day (QID) | INTRAVENOUS | Status: DC
  Administered 2014-03-17 – 2014-03-18 (×4): 5 mg via INTRAVENOUS
  Filled 2014-03-17 (×7): qty 5

## 2014-03-17 MED ORDER — FUROSEMIDE 10 MG/ML IJ SOLN
40.0000 mg | Freq: Once | INTRAMUSCULAR | Status: AC
  Administered 2014-03-17: 40 mg via INTRAVENOUS
  Filled 2014-03-17: qty 4

## 2014-03-17 MED ORDER — NITROGLYCERIN 2 % TD OINT
0.5000 [in_us] | TOPICAL_OINTMENT | Freq: Four times a day (QID) | TRANSDERMAL | Status: DC
  Administered 2014-03-17 – 2014-03-18 (×5): 0.5 [in_us] via TOPICAL
  Filled 2014-03-17: qty 30

## 2014-03-17 NOTE — Progress Notes (Addendum)
Text page sent to Dr. Algis Liming to notify of increasing troponins. MD called to advise that he was no longer attending, but to send info to Dr. Thereasa Solo to whom pt was now assigned. Text page sent to Dr. Thereasa Solo that troponins went from 0.38 to 0.51. Day RN aware of values and is awaiting return call from MD.    Above note per Raynelle Highland, RN at 8:27AM  MD addendum:  Visited pt in room on unit ~9:15AM.  Pt in no distress and sleeping comfortalby.  Sats 100% on 3LPM [place].  Denies current cp when I wake her up.  Cherene Altes, MD [facility] For Consults/Admissions - Flow Manager - [phone] Office  [phone] Pager [phone]  On-Call/Text Page:      [email or web address]      password [facility]

## 2014-03-17 NOTE — Anesthesia Preprocedure Evaluation (Addendum)
Anesthesia Evaluation  Patient identified by MRN, date of birth, ID band Patient awake    Reviewed: Allergy & Precautions, H&P , NPO status , Patient's Chart, lab work & pertinent test results  History of Anesthesia Complications Negative for: history of anesthetic complications  Airway Mallampati: II TM Distance: >3 FB     Dental  (+) Edentulous Upper, Edentulous Lower   Pulmonary shortness of breath and with exertion, asthma , pneumonia -, COPD COPD inhaler, Current Smoker,          Cardiovascular hypertension, Pt. on medications + Peripheral Vascular Disease     Neuro/Psych  Headaches, PSYCHIATRIC DISORDERS Anxiety    GI/Hepatic hiatal hernia, GERD-  Medicated,  Endo/Other  diabetes, Type 2, Insulin Dependent  Renal/GU      Musculoskeletal   Abdominal   Peds  Hematology  (+) anemia ,   Anesthesia Other Findings   Reproductive/Obstetrics                        Anesthesia Physical Anesthesia Plan  ASA: IV  Anesthesia Plan: MAC   Post-op Pain Management:    Induction: Intravenous  Airway Management Planned: Nasal Cannula  Additional Equipment:   Intra-op Plan:   Post-operative Plan:   Informed Consent: I have reviewed the patients History and Physical, chart, labs and discussed the procedure including the risks, benefits and alternatives for the proposed anesthesia with the patient or authorized representative who has indicated his/her understanding and acceptance.     Plan Discussed with: CRNA, Anesthesiologist and Surgeon  Anesthesia Plan Comments:        Anesthesia Quick Evaluation

## 2014-03-17 NOTE — Interval H&P Note (Signed)
History and Physical Interval Note:  03/17/2014 12:21 PM  [name]  has presented today for surgery, with the diagnosis of Anemia, Chest pain, Melena.  The various methods of treatment have been discussed with the patient and family. After consideration of risks, benefits and other options for treatment, the patient has consented to  Procedure(s): ESOPHAGOGASTRODUODENOSCOPY (EGD) (N/A) as a surgical intervention .  The patient's history has been reviewed, patient examined, no change in status, stable for surgery.  I have reviewed the patient's chart and labs.  Questions were answered to the patient's satisfaction.     Irene Shipper

## 2014-03-17 NOTE — Progress Notes (Signed)
[name] TEAM 1 - Stepdown/ICU TEAM Progress Note  [name] LNL:[id number] DOB: 01/30/1941 DOA: 03/16/2014 PCP: Shelby Brazil, MD  Admit HPI / Brief Narrative: 73 y.o. female with extensive PMH-COPD not on home oxygen, HTN, arthritis on chronic alternate day prednisone, Barrett's esophagus status post stretching 7-8 years ago, GERD, cirrhosis, PAD, DM 2, anxiety, gout, normal nuclear stress test January 2014, who presented to the ED w/ c/o abdominal distention, heartburn, difficulty breathing, rectal bleeding and black stools. She gave a 7-10 days history of intermittent and progressively worsening abdominal distention, 2-3 loose BMs daily, intermittent bright red rectal bleeding that she attributed to her hemorrhoids, and "black tarry stools" 2-3 days ago.  She does take a baby aspirin and when necessary Aleve for her several arthritic pains. Today she went to use the toilet and started having palpitations and worsening dyspnea which did not subside with her home inhaler. She denied radiation of her "heartburn" pain.   In the ED, hemoglobin was 6.2 (compared to 10 a couple months ago), MCV 69, POC troponin 0.11, EKG with mild T wave inversions in lateral leads and chest x-ray without acute findings. Rectal exam by EDP showed brown stools and FOBT +. GI has been consulted by EDP.   HPI/Subjective: Called to see pt by RN due to respiratory distress.  Upon entering room pt is asleep in bed.  No evidence of tachypnea.  Sats 100% on 3LPM [place].  Upon waking she c/o "feeling bad all over."  She denies SSCP.  She c/o crampy diffuse abdom pian.  Denis diarrhea, n/v, or HA.  Assessment/Plan:  Acute blood loss anemia on chronic anemia  Now s/p 2U PRBC - Hgb nadir of 6.2 - Hgb now improved to 9.1 - follow trend in serial fashion  GIB - BRBPR + melena GI has seen - plan for endoscopic exam (begin w/ EGD)  Elevated troponin No acute EKG changes - no typical CP sx - likely demand ischemia  from severe anemia - Nuclear stress test in January 2014 was low risk - unable to use aspirin or heparin secondary to GI bleed - if trop climbs further will need coronary eval once stable from standpoint of anemia/GIB - BB - nitrate - check f/u EKG - trend troponin   Abdominal distention KUB unrevealing - exam benign - follow for now   COPD Not on home O2 - no wheezing on exam   HTN Begin BB and add nitro - follow   GERD - Hx of Barrett's esoph For EGD today   Hx of Non-EtOH cirrhosis  LFTs normal - albumin modestly depressed - coags normal  DM2 - uncontrolled Cont SSI and follow  PVD s/p L SFA stenting 12/10/12 - L TMA Feb 2014  Psoriatic arthritis on chronic prednisone Cont daily steroid - no evidence of addisonian crisis at present   Code Status: FULL Family Communication: no family present at time of exam Disposition Plan: SDU  Consultants: GI - [name]   Procedures: EGD - pending   Antibiotics: none  DVT prophylaxis: SCDs   Objective: Blood pressure 144/72, pulse 83, temperature 98 F (36.7 C), temperature source Oral, resp. rate 23, height 5\' 4"  (1.626 m), weight 73.4 kg (161 lb 13.1 oz), SpO2 100.00%.  Intake/Output Summary (Last 24 hours) at 03/17/14 0923 Last data filed at 03/17/14 0718  Gross per 24 hour  Intake 1572.09 ml  Output   1975 ml  Net -402.91 ml   Exam: General: No acute respiratory distress  at rest  Lungs: Clear to auscultation bilaterally without wheezes or crackles Cardiovascular: Regular rate and rhythm without murmur gallop or rub normal S1 and S2 Abdomen: Nontender, nondistended, soft, bowel sounds positive, no rebound, no ascites, no appreciable mass Extremities: No significant cyanosis, clubbing, or edema bilateral lower extremities  Data Reviewed: Basic Metabolic Panel:  Recent Labs Lab 03/16/14 1243 03/17/14 0430  NA 138 138  K 4.6 5.0  CL 103 102  CO2 22 23  GLUCOSE 210* 165*  BUN 14 12  CREATININE 0.94 0.96  CALCIUM  9.7 9.8   Liver Function Tests:  Recent Labs Lab 03/16/14 1243  AST 20  ALT 21  ALKPHOS 88  BILITOT 0.2*  PROT 6.6  ALBUMIN 3.2*    Recent Labs Lab 03/16/14 1243  LIPASE 21   CBC:  Recent Labs Lab 03/16/14 1243 03/17/14 0430  WBC 10.5 11.3*  HGB 6.2* 9.1*  HCT 22.9* 31.0*  MCV 68.6* 72.6*  PLT 291 290   Cardiac Enzymes:  Recent Labs Lab 03/16/14 1830 03/17/14 0430  TROPONINI 0.38* 0.51*   CBG:  Recent Labs Lab 03/16/14 1631 03/16/14 2144 03/17/14 0528 03/17/14 0813  GLUCAP 139* 241* 150* 167*    Recent Results (from the past 240 hour(s))  MRSA PCR SCREENING     Status: None   Collection Time    03/16/14  5:45 PM      Result Value Ref Range Status   MRSA by PCR NEGATIVE  NEGATIVE Final   Comment:            The GeneXpert MRSA Assay (FDA     approved for NASAL specimens     only), is one component of a     comprehensive MRSA colonization     surveillance program. It is not     intended to diagnose MRSA     infection nor to guide or     monitor treatment for     MRSA infections.     Studies:  Recent x-ray studies have been reviewed in detail by the Attending Physician  Scheduled Meds:  Scheduled Meds: . albuterol  2.5 mg Nebulization Q6H  . budesonide-formoterol  2 puff Inhalation QPM  . cycloSPORINE  1 drop Both Eyes BID  . insulin aspart  0-5 Units Subcutaneous QHS  . insulin aspart  0-9 Units Subcutaneous TID WC  . latanoprost  1 drop Both Eyes QHS  . [START ON 03/20/2014] pantoprazole (PROTONIX) IV  40 mg Intravenous Q12H  . polyethylene glycol  17 g Oral Daily  . predniSONE  5 mg Oral Daily  . senna  2 tablet Oral Daily  . sodium chloride  3 mL Intravenous Q12H  . tiotropium  18 mcg Inhalation Daily    Time spent on care of this patient: 35 mins   [name] , MD   [facility] Office  [phone] Pager - Text Page per Amion as per below:  On-Call/Text Page:      [name]      password [email or web address]  If  7PM-7AM, please contact night-coverage [email or web address] Password [email or web address] 03/17/2014, 9:23 AM   LOS: 1 day

## 2014-03-17 NOTE — H&P (View-Only) (Signed)
Consult for [facility]  Reason for Consult: Anemia, Heme positive stool, and Chest pain Referring Physician: [facility]  Peyton Bottoms HPI: This is a 73 year old female with a PMH of Candidal esophagitis, reflux esophagitis, and hiatal hernia who is admitted for complaints of chest and throat discomfort.  She reports that her symptoms have been ongoing for the past several weeks and she denies any improvement with the use of antacids.  No issues with vomiting.  On 04/2010 and 10/2006 her EGDs revealed Candidal esophagitis and a 4 cm hiatal hernia.  During her admission work up she was identified to have an anemia in the 6 range and heme positive stool.  She reports intermittent issues with melena that is associated with hematochezia.  The patient attributes the hematochezia to her hemorrhoids.  Dr. Sharlett Iles did perform a colonoscopy in the past, but I do not have the report available.  She also complains of abdominal distension and in the past she was identified to have a distended bladder that resulted in her symptoms.  Once her bladder distension was relieved she was able to breathe comfortably.  Past Medical History  Diagnosis Date  . COPD (chronic obstructive pulmonary disease)   . Hypertension   . Asthma   . Psoriatic arthritis   . Psoriasis   . GERD (gastroesophageal reflux disease)   . Hiatal hernia   . Chronic sinus infection   . Heart murmur   . Blood transfusion ~ 1962    "11 units of blood"  . Ankylosing spondylitis   . Chronic back pain   . Dysrhythmia     "palpitations"  . Esophageal dilatation   . Candida esophagitis   . Cirrhosis of liver without mention of alcohol   . Hypercholesteremia   . Peripheral vascular disease   . Exertional dyspnea   . Pneumonia 10/07/11; 2006; 1980's  . Chronic bronchitis     "since childhood" (12/06/2012)  . Type II diabetes mellitus   . Iron deficiency anemia   . External bleeding hemorrhoids   . Sinus headache   . Arthritis      "psorasic"  . OA (osteoarthritis)   . DJD (degenerative joint disease) of knee     "both" (12/06/2012)  . Carpal tunnel syndrome on right   . Anxiety   . Barrett esophagus   . Non-healing ulcer of foot, secondary to diabetes and PVD 12/07/2012  . PVD (peripheral vascular disease) 12/07/2012  . Tobacco abuse 12/07/2012  . Critical lower limb ischemia, with ulcer Lt foot 12/07/2012  . Gout   . COPD (chronic obstructive pulmonary disease)   . Carotid artery disease     dopplers showed mod L ICA stenosis   . PAD (peripheral artery disease)   . History of nuclear stress test 11/2012    lexiscan; normal, low risk   . Dermatophytosis of nail [id number]    Past Surgical History  Procedure Laterality Date  . Nasal sinus surgery  09/29/2009  . Carpal tunnel release  2011    left hand  . Lipoma excision  2010    "left occipital area"  . Cataract extraction w/ intraocular lens implant  2009/2010    right/left  . Eye surgery    . Oophorectomy      'not sure which one"  . Appendectomy    . Angioplasty  12/10/2012    "LLE"; LSFA Diamondback orbital rotational arthrectomy, PTA , stenting with IDEV stent (left ABI improved from 0.35 to 0.75)  .  Abdominal hysterectomy  1976  . Breast biopsy  1960's    "think he did both" (12/06/2012)  . 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 Esophagogastroduodenoscopy  2011  . Colonoscopy  2007    Hemorrhoids and Diverticulosis    Family History  Problem Relation Age of Onset  . Lupus Sister     x2  . Lung cancer Sister   . Thyroid disease Sister   . Colon cancer Neg Hx   . Cancer Mother   . Heart Problems Maternal Grandmother   . Cancer Paternal Grandfather   . Diabetes Brother     x2  . Arthritis Brother     x3  . Arthritis Sister     x3    Social History:  reports that she has been smoking Cigarettes.   She has a 40 pack-year smoking history. She has never used smokeless tobacco. She reports that she does not drink alcohol or use illicit drugs.  Allergies:  Allergies  Allergen Reactions  . Amlodipine Besy-Benazepril Hcl Other (See Comments)    Nervous/shakiness  . Statins Other (See Comments)    "can't take any of them; cramp me up" (12/06/2012)  . Plavix [Clopidogrel] Rash    Pt states that the doctor told her that her rash was psoriasis and not related to plavix but she states the rash came from the plavix  . Raptiva [Efalizumab] Rash    Medications:  Scheduled: . albuterol  2.5 mg Nebulization Q6H  . budesonide-formoterol  2 puff Inhalation QPM  . cycloSPORINE  1 drop Both Eyes BID  . furosemide  20 mg Intravenous Once  . insulin aspart  0-5 Units Subcutaneous QHS  . insulin aspart  0-9 Units Subcutaneous TID WC  . latanoprost  1 drop Both Eyes QHS  . pantoprazole (PROTONIX) IV  80 mg Intravenous Once  . [START ON 03/20/2014] pantoprazole (PROTONIX) IV  40 mg Intravenous Q12H  . predniSONE  5 mg Oral Daily  . sodium chloride  3 mL Intravenous Q12H  . tiotropium  18 mcg Inhalation Daily   Continuous: . sodium chloride    . pantoprozole (PROTONIX) infusion      Results for orders placed during the hospital encounter of 03/16/14 (from the past 24 hour(s))  APTT     Status: None   Collection Time    03/16/14 12:43 PM      Result Value Ref Range   aPTT 27  24 - 37 seconds  CBC     Status: Abnormal   Collection Time    03/16/14 12:43 PM      Result Value Ref Range   WBC 10.5  4.0 - 10.5 K/uL   RBC 3.34 (*) 3.87 - 5.11 MIL/uL   Hemoglobin 6.2 (*) 12.0 - 15.0 g/dL   HCT 22.9 (*) 36.0 - 46.0 %   MCV 68.6 (*) 78.0 - 100.0 fL   MCH 18.6 (*) 26.0 - 34.0 pg   MCHC 27.1 (*) 30.0 - 36.0 g/dL   RDW 20.4 (*) 11.5 - 15.5 %   Platelets 291  150 - 400 K/uL  COMPREHENSIVE METABOLIC PANEL     Status: Abnormal   Collection Time    03/16/14 12:43 PM      Result Value Ref Range    Sodium 138  137 - 147 mEq/L   Potassium 4.6  3.7 - 5.3  mEq/L   Chloride 103  96 - 112 mEq/L   CO2 22  19 - 32 mEq/L   Glucose, Bld 210 (*) 70 - 99 mg/dL   BUN 14  6 - 23 mg/dL   Creatinine, Ser 0.94  0.50 - 1.10 mg/dL   Calcium 9.7  8.4 - 10.5 mg/dL   Total Protein 6.6  6.0 - 8.3 g/dL   Albumin 3.2 (*) 3.5 - 5.2 g/dL   AST 20  0 - 37 U/L   ALT 21  0 - 35 U/L   Alkaline Phosphatase 88  39 - 117 U/L   Total Bilirubin 0.2 (*) 0.3 - 1.2 mg/dL   GFR calc non Af Amer 59 (*) >90 mL/min   GFR calc Af Amer 68 (*) >90 mL/min  PROTIME-INR     Status: None   Collection Time    03/16/14 12:43 PM      Result Value Ref Range   Prothrombin Time 14.8  11.6 - 15.2 seconds   INR 1.19  0.00 - 1.49  LIPASE, BLOOD     Status: None   Collection Time    03/16/14 12:43 PM      Result Value Ref Range   Lipase 21  11 - 59 U/L  I-STAT TROPOININ, ED     Status: Abnormal   Collection Time    03/16/14  1:06 PM      Result Value Ref Range   Troponin i, poc 0.16 (*) 0.00 - 0.08 ng/mL   Comment NOTIFIED PHYSICIAN     Comment 3           TYPE AND SCREEN     Status: None   Collection Time    03/16/14  1:56 PM      Result Value Ref Range   ABO/RH(D) A NEG     Antibody Screen NEG     Sample Expiration 03/19/2014    POC OCCULT BLOOD, ED     Status: Abnormal   Collection Time    03/16/14  2:14 PM      Result Value Ref Range   Fecal Occult Bld POSITIVE (*) NEGATIVE     Dg Chest Portable 1 View  03/16/2014   CLINICAL DATA:  Shortness of breath. Panic attack. COPD. Exertional asthma.  EXAM: PORTABLE CHEST - 1 VIEW  COMPARISON:  12/01/2013  FINDINGS: Mild to moderate cardiomegaly is stable. Both lungs remain clear. No evidence of pleural effusion. Mild tortuosity of thoracic aorta appears stable.  IMPRESSION: Stable cardiomegaly.  No acute findings.   Electronically Signed   By: Earle Gell M.D.   On: 03/16/2014 12:05    ROS:  As stated above in the HPI otherwise negative.  Blood pressure 136/62, pulse 77,  temperature 98.3 F (36.8 C), temperature source Oral, resp. rate 21, height 5\' 4"  (1.626 m), weight 167 lb (75.751 kg), SpO2 93.00%.    PE: Gen: NAD, Alert and Oriented HEENT:  [place]/AT, EOMI Neck: Supple, no LAD Lungs: CTA Bilaterally CV: RRR without M/G/R ABM: Soft, mild epigastric tenderness, distended versus obesity, +BS Ext: No C/C/E  Assessment/Plan: 1) Chest pain. 2) History of Candidal esophagitis - she is on steroid inhalers. 3) Hiatal hernia.  Plan: 1) EGD tomorrow with Dr. Henrene Pastor. 2) Follow HGB and transfuse as necessary. Beryle Beams 03/16/2014, 3:30 PM

## 2014-03-17 NOTE — Anesthesia Postprocedure Evaluation (Signed)
Anesthesia Post Note  Patient: [name]  Procedure(s) Performed: Procedure(s) (LRB): ESOPHAGOGASTRODUODENOSCOPY (EGD) (N/A)  Anesthesia type: general  Patient location: PACU  Post pain: Pain level controlled  Post assessment: Patient's Cardiovascular Status Stable  Last Vitals:  Filed Vitals:   03/17/14 1315  BP: 133/59  Pulse: 80  Temp:   Resp: 31    Post vital signs: Reviewed and stable  Level of consciousness: sedated  Complications: No apparent anesthesia complications

## 2014-03-17 NOTE — Transfer of Care (Signed)
Immediate Anesthesia Transfer of Care Note  Patient: [name]  Procedure(s) Performed: Procedure(s): ESOPHAGOGASTRODUODENOSCOPY (EGD) (N/A)  Patient Location: Endoscopy Unit  Anesthesia Type:MAC  Level of Consciousness: awake, alert , oriented and patient cooperative  Airway & Oxygen Therapy: Patient Spontanous Breathing and Patient connected to nasal cannula oxygen  Post-op Assessment: Report given to PACU RN, Post -op Vital signs reviewed and stable and Patient moving all extremities X 4  Post vital signs: Reviewed and stable  Complications: No apparent anesthesia complications

## 2014-03-17 NOTE — Progress Notes (Signed)
  Echocardiogram 2D Echocardiogram has been performed.  [name] 03/17/2014, 2:26 PM

## 2014-03-17 NOTE — Op Note (Signed)
[facility] [place], [address]   ENDOSCOPY PROCEDURE REPORT  PATIENT: Shelby, Harding  MR#: [id number] BIRTHDATE: 03-19-1941 , 73  yrs. old GENDER: Female ENDOSCOPIST: Eustace Quail, MD REFERRED BY:  [facility] PROCEDURE DATE:  03/17/2014 PROCEDURE:  EGD, diagnostic ASA CLASS:     Class III INDICATIONS:  Iron deficiency anemia.   Occult blood positive. MEDICATIONS: MAC sedation, administered by CRNA, Propofol (Diprivan), and See Anesthesia Report. TOPICAL ANESTHETIC: Cetacaine Spray  DESCRIPTION OF PROCEDURE: After the risks benefits and alternatives of the procedure were thoroughly explained, informed consent was obtained.  The PENTAX GASTOROSCOPE [id number] endoscope was introduced through the mouth and advanced to the second portion of the duodenum. Without limitations.  The instrument was slowly withdrawn as the mucosa was fully examined.    EXAM:The upper, middle and distal third of the esophagus were carefully inspected and no abnormalities were noted.  The z-line was well seen at the GEJ.  The endoscope was pushed into the fundus which was normal including a retroflexed view.  The antrum, gastric body, first and second part of the duodenum were unremarkable. Retroflexed views revealed a hiatal hernia.     The scope was then withdrawn from the patient and the procedure completed.  COMPLICATIONS: There were no complications. ENDOSCOPIC IMPRESSION: 1. Normal EGD 2. Iron deficiency anemia and Hemoccult-positive stool. No acute bleeding. May have small bowel or colonic AVMs. 3. Colonoscopy 2007 without neoplasia  RECOMMENDATIONS: 1. Initiate Iron sulfate 325 mg twice a day by mouth 2. Outpatient GI followup with Dr. Henrene Pastor. May need colonoscopy and possible capsule endoscopy.  Will sign off. Thank you  REPEAT EXAM:  eSigned:  Eustace Quail, MD 03/17/2014 12:53 PM   CC:The Patient and Malen Gauze,  MD

## 2014-03-18 ENCOUNTER — Encounter (HOSPITAL_COMMUNITY): Payer: Self-pay | Admitting: Internal Medicine

## 2014-03-18 DIAGNOSIS — I509 Heart failure, unspecified: Secondary | ICD-10-CM

## 2014-03-18 DIAGNOSIS — I5043 Acute on chronic combined systolic (congestive) and diastolic (congestive) heart failure: Secondary | ICD-10-CM | POA: Diagnosis present

## 2014-03-18 DIAGNOSIS — K2270 Barrett's esophagus without dysplasia: Secondary | ICD-10-CM

## 2014-03-18 DIAGNOSIS — I5042 Chronic combined systolic (congestive) and diastolic (congestive) heart failure: Secondary | ICD-10-CM

## 2014-03-18 DIAGNOSIS — K209 Esophagitis, unspecified without bleeding: Secondary | ICD-10-CM

## 2014-03-18 DIAGNOSIS — R0789 Other chest pain: Secondary | ICD-10-CM

## 2014-03-18 DIAGNOSIS — E119 Type 2 diabetes mellitus without complications: Secondary | ICD-10-CM

## 2014-03-18 DIAGNOSIS — R748 Abnormal levels of other serum enzymes: Secondary | ICD-10-CM

## 2014-03-18 DIAGNOSIS — J449 Chronic obstructive pulmonary disease, unspecified: Secondary | ICD-10-CM

## 2014-03-18 DIAGNOSIS — L4050 Arthropathic psoriasis, unspecified: Secondary | ICD-10-CM

## 2014-03-18 DIAGNOSIS — I10 Essential (primary) hypertension: Secondary | ICD-10-CM

## 2014-03-18 LAB — COMPREHENSIVE METABOLIC PANEL
ALBUMIN: 3 g/dL — AB (ref 3.5–5.2)
ALT: 14 U/L (ref 0–35)
AST: 14 U/L (ref 0–37)
Alkaline Phosphatase: 81 U/L (ref 39–117)
BUN: 11 mg/dL (ref 6–23)
CHLORIDE: 104 meq/L (ref 96–112)
CO2: 25 mEq/L (ref 19–32)
CREATININE: 0.94 mg/dL (ref 0.50–1.10)
Calcium: 9.2 mg/dL (ref 8.4–10.5)
GFR calc Af Amer: 68 mL/min — ABNORMAL LOW (ref >90)
GFR calc non Af Amer: 58 mL/min — ABNORMAL LOW (ref >90)
Glucose, Bld: 173 mg/dL — ABNORMAL HIGH (ref 70–99)
Potassium: 4.3 mEq/L (ref 3.7–5.3)
Sodium: 141 mEq/L (ref 137–147)
Total Bilirubin: 0.4 mg/dL (ref 0.3–1.2)
Total Protein: 6.3 g/dL (ref 6.0–8.3)

## 2014-03-18 LAB — CBC
HEMATOCRIT: 28.3 % — AB (ref 36.0–46.0)
Hemoglobin: 8.2 g/dL — ABNORMAL LOW (ref 12.0–15.0)
MCH: 21 pg — AB (ref 26.0–34.0)
MCHC: 29 g/dL — ABNORMAL LOW (ref 30.0–36.0)
MCV: 72.4 fL — AB (ref 78.0–100.0)
Platelets: 287 10*3/uL (ref 150–400)
RBC: 3.91 MIL/uL (ref 3.87–5.11)
RDW: 23 % — AB (ref 11.5–15.5)
WBC: 10.5 10*3/uL (ref 4.0–10.5)

## 2014-03-18 LAB — GLUCOSE, CAPILLARY
GLUCOSE-CAPILLARY: 161 mg/dL — AB (ref 70–99)
Glucose-Capillary: 156 mg/dL — ABNORMAL HIGH (ref 70–99)
Glucose-Capillary: 190 mg/dL — ABNORMAL HIGH (ref 70–99)
Glucose-Capillary: 197 mg/dL — ABNORMAL HIGH (ref 70–99)

## 2014-03-18 LAB — PROTIME-INR
INR: 1.12 (ref 0.00–1.49)
Prothrombin Time: 14.2 s (ref 11.6–15.2)

## 2014-03-18 LAB — HEMOGLOBIN AND HEMATOCRIT, BLOOD
HCT: 29 % — ABNORMAL LOW (ref 36.0–46.0)
Hemoglobin: 8.3 g/dL — ABNORMAL LOW (ref 12.0–15.0)

## 2014-03-18 LAB — TROPONIN I
TROPONIN I: 0.33 ng/mL — AB (ref <0.30)
Troponin I: <0.3 ng/mL

## 2014-03-18 NOTE — Progress Notes (Signed)
[facility] TEAM 1 - Stepdown/ICU TEAM Progress Note  [name] JXB:[id number] DOB: 24-Jan-1941 DOA: 03/16/2014 PCP: Leola Brazil, MD  Admit HPI / Brief Narrative: 73 y.o. BF PMHx anxiety, COPD not on home oxygen, HTN, psoriatic arthritis on chronic alternate day prednisone, Barrett's esophagus status post stretching 7-8 years ago, GERD, cirrhosis, PAD, DM 2, gout, normal nuclear stress test January 2014, who presented to the ED w/ c/o abdominal distention, heartburn, difficulty breathing, rectal bleeding and black stools. She gave a 7-10 days history of intermittent and progressively worsening abdominal distention, 2-3 loose BMs daily, intermittent bright red rectal bleeding that she attributed to her hemorrhoids, and "black tarry stools" 2-3 days ago. She does take a baby aspirin and when necessary Aleve for her several arthritic pains. Today she went to use the toilet and started having palpitations and worsening dyspnea which did not subside with her home inhaler. She denied radiation of her "heartburn" pain.  In the ED, hemoglobin was 6.2 (compared to 10 a couple months ago), MCV 69, POC troponin 0.11, EKG with mild T wave inversions in lateral leads and chest x-ray without acute findings. Rectal exam by EDP showed brown stools and FOBT +. GI has been consulted by EDP.      HPI/Subjective: 4/21 sitting comfortably in bed, negative CP/SOB. Has not had a BM so on able to check occult card  Assessment/Plan: Acute blood loss anemia on chronic anemia  -4/19 s/p 2U PRBC  - Hgb nadir of 6.2 - Hgb now improved to 9.1 following transfusion   GIB - BRBPR + melena  -Patient's hemoglobin dropped to 8.3 from a high of 9.1 on 4/20 Will trend and if continues to drop will reconsult GI for colonoscopy  - Occult blood card  Elevated troponin  No acute EKG changes - no typical CP sx - likely demand ischemia from severe anemia - Nuclear stress test in January 2014 was low risk - unable to  use aspirin or heparin secondary to GI bleed  -Troponins beginning to trend down continue to follow   Abdominal distention  KUB unrevealing - exam benign - follow for now   COPD  Not on home O2 - no wheezing on exam    Systiolic and Diastolic CHF -Will hold patient's home medication secondary to situational hypotension (patient 73yo) -DC NTG paste, and metoprolol IV -In a.m. will attempt to transition onto home regimen   HTN  -Patient having negative CP so will DC NTG paste and metoprolol IV -If BP increases overnight start metoprolol 12.5 mg BID  GERD - Hx of Barrett's esoph  -EGD Nondiagnostic for bleed  -Patient's H./H. drop slightly overnight will trend and if continues to drop will request colonoscopy from GI    Hx of Non-EtOH cirrhosis  LFTs normal - albumin modestly depressed - coags normal   DM2 - uncontrolled  -Cont SSI and follow   PVD  -s/p L SFA stenting 12/10/12 - L TMA Feb 2014   Psoriatic arthritis on chronic prednisone  -Cont daily steroid - no evidence of addisonian crisis at present   Code Status: FULL  Family Communication: no family present at time of exam  Disposition Plan:     Consultants: Dr. Scarlette Shorts (GI)    Procedure/Significant Events: Echocardiogram 03/17/2014 -LVEF= is approximately 35% with hypokinesis of the distal anterior and lateral walls; akinesis of the mid/distal inferoseptal, distal inferior and apical walls.  mild LVH. -(grade 1 diastolic dysfunction). - Mitral valve: Mild regurgitation. - Pericardium,  extracardiac: A small pericardial effusion surrounds the heart.  EGD 03/17/2014 ENDOSCOPIC IMPRESSION:  1. Normal EGD  2. Iron deficiency anemia and Hemoccult-positive stool. No acute bleeding. May have small bowel or colonic AVMs.  3. Colonoscopy 2007 without neoplasia    Culture   Antibiotics:   DVT prophylaxis: SCD   Devices   LINES / TUBES:      Continuous Infusions: . sodium chloride 10 mL/hr at  03/17/14 2152  . pantoprozole (PROTONIX) infusion 8 mg/hr (03/17/14 2152)    Objective: VITAL SIGNS: Temp: 97.9 F (36.6 C) (04/21 0735) Temp src: Oral (04/21 0735) BP: 135/69 mmHg (04/21 0735) Pulse Rate: 76 (04/21 0735) FIO2:   Intake/Output Summary (Last 24 hours) at 03/18/14 0901 Last data filed at 03/18/14 0534  Gross per 24 hour  Intake    660 ml  Output    250 ml  Net    410 ml     Exam: General: A./O. x4, NAD  Lungs: Clear to auscultation bilaterally without wheezes or crackles Cardiovascular: Regular rate and rhythm without murmur gallop or rub normal S1 and S2 Renalbalance today;        /overall;        Creatinine ;        Hourly output   Abdomen: Nontender, nondistended, soft, bowel sounds positive, no rebound, no ascites, no appreciable mass Extremities: No significant cyanosis, clubbing, or edema bilateral lower extremities  Data Reviewed: Basic Metabolic Panel:  Recent Labs Lab 03/16/14 1243 03/17/14 0430 03/18/14 0330  NA 138 138 141  K 4.6 5.0 4.3  CL 103 102 104  CO2 22 23 25   GLUCOSE 210* 165* 173*  BUN 14 12 11   CREATININE 0.94 0.96 0.94  CALCIUM 9.7 9.8 9.2   Liver Function Tests:  Recent Labs Lab 03/16/14 1243 03/18/14 0330  AST 20 14  ALT 21 14  ALKPHOS 88 81  BILITOT 0.2* 0.4  PROT 6.6 6.3  ALBUMIN 3.2* 3.0*    Recent Labs Lab 03/16/14 1243  LIPASE 21   No results found for this basename: AMMONIA,  in the last 168 hours CBC:  Recent Labs Lab 03/16/14 1243 03/17/14 0430 03/17/14 1419 03/18/14 0330  WBC 10.5 11.3* 11.2* 10.5  HGB 6.2* 9.1* 8.8* 8.2*  HCT 22.9* 31.0* 29.5* 28.3*  MCV 68.6* 72.6* 72.5* 72.4*  PLT 291 290 277 287   Cardiac Enzymes:  Recent Labs Lab 03/16/14 1830 03/17/14 0430 03/17/14 1031 03/17/14 1713 03/18/14 0330  TROPONINI 0.38* 0.51* 0.47* 0.36* 0.33*   BNP (last 3 results) No results found for this basename: PROBNP,  in the last 8760 hours CBG:  Recent Labs Lab 03/17/14 0528  03/17/14 0813 03/17/14 1715 03/17/14 2147 03/18/14 0745  GLUCAP 150* 167* 230* 192* 161*    Recent Results (from the past 240 hour(s))  MRSA PCR SCREENING     Status: None   Collection Time    03/16/14  5:45 PM      Result Value Ref Range Status   MRSA by PCR NEGATIVE  NEGATIVE Final   Comment:            The GeneXpert MRSA Assay (FDA     approved for NASAL specimens     only), is one component of a     comprehensive MRSA colonization     surveillance program. It is not     intended to diagnose MRSA     infection nor to guide or     monitor treatment for  MRSA infections.     Studies:  Recent x-ray studies have been reviewed in detail by the Attending Physician  Scheduled Meds:  Scheduled Meds: . albuterol  2.5 mg Nebulization Q6H  . budesonide-formoterol  2 puff Inhalation QPM  . cycloSPORINE  1 drop Both Eyes BID  . insulin aspart  0-5 Units Subcutaneous QHS  . insulin aspart  0-9 Units Subcutaneous TID WC  . latanoprost  1 drop Both Eyes QHS  . metoprolol  5 mg Intravenous 4 times per day  . nitroGLYCERIN  0.5 inch Topical 4 times per day  . [START ON 03/20/2014] pantoprazole (PROTONIX) IV  40 mg Intravenous Q12H  . polyethylene glycol  17 g Oral Daily  . predniSONE  5 mg Oral Daily  . senna  2 tablet Oral Daily  . sodium chloride  3 mL Intravenous Q12H  . tiotropium  18 mcg Inhalation Daily    Time spent on care of this patient: 40 mins   Allie Bossier , MD   [facility] Office  [phone] Pager [phone]  On-Call/Text Page:      [email or web address]      password [email or web address]  If 7PM-7AM, please contact night-coverage [email or web address] Password [email or web address] 03/18/2014, 9:01 AM   LOS: 2 days

## 2014-03-19 LAB — CBC
HCT: 29.1 % — ABNORMAL LOW (ref 36.0–46.0)
Hemoglobin: 8.3 g/dL — ABNORMAL LOW (ref 12.0–15.0)
MCH: 21.2 pg — ABNORMAL LOW (ref 26.0–34.0)
MCHC: 28.5 g/dL — AB (ref 30.0–36.0)
MCV: 74.2 fL — ABNORMAL LOW (ref 78.0–100.0)
Platelets: 259 10*3/uL (ref 150–400)
RBC: 3.92 MIL/uL (ref 3.87–5.11)
RDW: 23.9 % — AB (ref 11.5–15.5)
WBC: 10.3 10*3/uL (ref 4.0–10.5)

## 2014-03-19 LAB — BASIC METABOLIC PANEL
BUN: 16 mg/dL (ref 6–23)
CO2: 26 mEq/L (ref 19–32)
Calcium: 9.6 mg/dL (ref 8.4–10.5)
Chloride: 107 mEq/L (ref 96–112)
Creatinine, Ser: 1.09 mg/dL (ref 0.50–1.10)
GFR, EST AFRICAN AMERICAN: 57 mL/min — AB (ref >90)
GFR, EST NON AFRICAN AMERICAN: 49 mL/min — AB (ref >90)
Glucose, Bld: 130 mg/dL — ABNORMAL HIGH (ref 70–99)
POTASSIUM: 5.5 meq/L — AB (ref 3.7–5.3)
Sodium: 142 mEq/L (ref 137–147)

## 2014-03-19 LAB — GLUCOSE, CAPILLARY
GLUCOSE-CAPILLARY: 217 mg/dL — AB (ref 70–99)
GLUCOSE-CAPILLARY: 181 mg/dL — AB (ref 70–99)
Glucose-Capillary: 129 mg/dL — ABNORMAL HIGH (ref 70–99)
Glucose-Capillary: 250 mg/dL — ABNORMAL HIGH (ref 70–99)

## 2014-03-19 LAB — TROPONIN I
Troponin I: <0.3 ng/mL (ref <0.30)
Troponin I: <0.3 ng/mL (ref <0.30)

## 2014-03-19 LAB — MAGNESIUM: Magnesium: 2.1 mg/dL (ref 1.5–2.5)

## 2014-03-19 MED ORDER — PANTOPRAZOLE SODIUM 40 MG PO TBEC
40.0000 mg | DELAYED_RELEASE_TABLET | Freq: Two times a day (BID) | ORAL | Status: DC
  Administered 2014-03-19 – 2014-03-20 (×2): 40 mg via ORAL
  Filled 2014-03-19 (×4): qty 1

## 2014-03-19 NOTE — Evaluation (Signed)
Occupational Therapy Evaluation Patient Details Name: [name] MRN: [id number] DOB: 1941/06/20 Today's Date: 03/19/2014    History of Present Illness Pt is a 73 y/o female admitted s/p presenting to ED with abdominal distention, heartburn, difficulty breathing, rectal bleeding and black stools. Was found to have a + GI bleed.   Clinical Impression   Pt present with generalized weakness, impaired balance, and decreased activity tolerance.  She has baseline COPD and arthritis which limits her activity level at baseline.  Pt requires min guard assist for ADL transfers and standing activities.  She will likely progress to be able to return home with the assist of her sister initially. Will follow.   Follow Up Recommendations  No OT follow up    Equipment Recommendations  None recommended by OT    Recommendations for Other Services       Precautions / Restrictions Precautions Precautions: Fall Restrictions Weight Bearing Restrictions: No      Mobility Bed Mobility               General bed mobility comments: Pt up in recliner.  Transfers Overall transfer level: Needs assistance Equipment used: Rolling walker (2 wheeled) Transfers: Sit to/from Stand Sit to Stand: Min guard         General transfer comment: VCs for hand placement, used momentum to power up.    Balance Overall balance assessment: Needs assistance Sitting-balance support: Feet supported Sitting balance-Leahy Scale: Good    Standing balance support: No upper extremity supported Standing balance-Leahy Scale: Fair                              ADL Overall ADL's : Needs assistance/impaired Eating/Feeding: Independent;Sitting   Grooming: Wash/dry hands;Min guard   Upper Body Bathing: Set up;Sitting   Lower Body Bathing: Min guard;Sit to/from stand   Upper Body Dressing : Set up;Sitting   Lower Body Dressing: Min guard;Sit to/from stand   Toilet Transfer: Min  guard;Ambulation;BSC   Toileting- Water quality scientist and Hygiene: Min guard;Sit to/from stand       Functional mobility during ADLs: Min guard;Rolling walker General ADL Comments: Pt is able to cross her foot over opposite knee to donn and doff socks.     Vision                     Perception     Praxis      Pertinent Vitals/Pain No pain, VSS, used 2 L 02 throughout     Hand Dominance Right   Extremity/Trunk Assessment Upper Extremity Assessment Upper Extremity Assessment: RUE deficits/detail;LUE deficits/detail RUE Deficits / Details: occasional pain from psoriatic arthritis, joint deformities in hand LUE Deficits / Details: States she had surgery on her arm years ago and it has never been the same. Reports occasional pain from shoulder down to wrist with movement.    Lower Extremity Assessment Lower Extremity Assessment: Defer to PT evaluation   Cervical / Trunk Assessment Cervical / Trunk Assessment: Kyphotic   Communication Communication Communication: No difficulties   Cognition Arousal/Alertness: Awake/alert Behavior During Therapy: WFL for tasks assessed/performed Overall Cognitive Status: Within Functional Limits for tasks assessed                     General Comments       Exercises       Shoulder Instructions      Home Living Family/patient expects to be discharged to:: Private  residence Living Arrangements: Other relatives Available Help at Discharge: Family;Available PRN/intermittently Type of Home: House Home Access: Stairs to enter [employer] of Steps: 1 Entrance Stairs-Rails: None Home Layout: One level     Bathroom Shower/Tub: Tub/shower unit Shower/tub characteristics: Door Biochemist, clinical: Standard     Home Equipment: Cane - single point;Walker - 2 wheels;Bedside commode   Additional Comments: pt stands to shower      Prior Functioning/Environment Level of Independence: Independent with assistive  device(s)        Comments: States she could do all ADL's herself but took her a long time    OT Diagnosis: Generalized weakness   OT Problem List: Decreased strength;Decreased activity tolerance;Impaired balance (sitting and/or standing);Decreased knowledge of use of DME or AE;Cardiopulmonary status limiting activity;Obesity   OT Treatment/Interventions: Self-care/ADL training;DME and/or AE instruction;Energy conservation;Patient/family education;Balance training    OT Goals(Current goals can be found in the care plan section) Acute Rehab OT Goals Patient Stated Goal: return home OT Goal Formulation: With patient Time For Goal Achievement: 03/26/14 Potential to Achieve Goals: Good ADL Goals Pt Will Perform Grooming: with modified independence;standing (at least 2 activities) Pt Will Perform Lower Body Bathing: with modified independence;sit to/from stand Pt Will Perform Lower Body Dressing: with modified independence;sit to/from stand Pt Will Transfer to Toilet: with modified independence;ambulating;bedside commode (over toilet) Pt Will Perform Tub/Shower Transfer: Tub transfer;with supervision;ambulating Additional ADL Goal #1: Pt will utilize energy conservation strategies during ADL independently. Additional ADL Goal #2: Pt will gather necessary items for ADL with RW at a modified independent level.  OT Frequency: Min 2X/week   Barriers to D/C:            Co-evaluation              End of Session    Activity Tolerance: Patient tolerated treatment well Patient left: in chair;with call bell/phone within reach;with nursing/sitter in room   Time: 1215-1252 OT Time Calculation (min): 37 min Charges:  OT General Charges $OT Visit: 1 Procedure OT Evaluation $Initial OT Evaluation Tier I: 1 Procedure OT Treatments $Self Care/Home Management : 8-22 mins G-Codes:    Haze Boyden Dray Dente 09-Apr-2014, 1:20 PM [phone]

## 2014-03-19 NOTE — Evaluation (Signed)
Physical Therapy Evaluation Patient Details Name: [name] MRN: [id number] DOB: [birth year] Today's Date: 03/19/2014   History of Present Illness  Pt is a 73 y/o female admitted s/p presenting to ED with abdominal distention, heartburn, difficulty breathing, rectal bleeding and black stools. Was found to have a + GI bleed.  Clinical Impression  Pt admitted with the above complaints. Pt currently with functional limitations due to the deficits listed below (see PT Problem List). At the time of PT eval, pt was able to perform functional mobility well, however sats dropped with ambulation on RA. Pt wants to return home at d/c, and feel this is a good option if pt can arrange 24 hour assist for at least the first few days. Pt will benefit from skilled PT to increase their independence and safety with mobility to allow discharge to the venue listed below.     Follow Up Recommendations Home health PT;Supervision/Assistance - 24 hour    Equipment Recommendations  Rolling walker with 5" wheels    Recommendations for Other Services       Precautions / Restrictions Precautions Precautions: Fall Restrictions Weight Bearing Restrictions: No      Mobility  Bed Mobility               General bed mobility comments: Pt sitting up on EOB when PT enters and has finished her breakfast.   Transfers Overall transfer level: Needs assistance Equipment used: Rolling walker (2 wheeled) Transfers: Sit to/from Stand Sit to Stand: Min guard         General transfer comment: VC's for hand placement on seated surface for safety. Pt able to power-up to full stand without assistance.   Ambulation/Gait Ambulation/Gait assistance: Min guard Ambulation Distance (Feet): 25 Feet Assistive device: Rolling walker (2 wheeled) Gait Pattern/deviations: Step-through pattern;Decreased stride length;Trunk flexed Gait velocity: Decreased Gait velocity interpretation: Below normal speed for  age/gender General Gait Details: Pt ambulated on RA and sats dropped to 87%. Able to be improved with pursed-lip breathing. Pt asymptomatic with decreased sats, and states she could go farther.   Stairs            Wheelchair Mobility    Modified Rankin (Stroke Patients Only)       Balance Overall balance assessment: Needs assistance Sitting-balance support: Feet supported Sitting balance-Leahy Scale: Good Sitting balance - Comments: Pt sat EOB independently for 10-15 minutes during history and preparation for ambulation.    Standing balance support: Bilateral upper extremity supported Standing balance-Leahy Scale: Fair                               Pertinent Vitals/Pain On RA sats dropped to 87% asymptomatic, with ability to improve with seated rest break up to 93%. Supplemental O2 donned at end of session.     Home Living Family/patient expects to be discharged to:: Private residence Living Arrangements: Other relatives (sister - there most nights a week) Available Help at Discharge: Family;Available PRN/intermittently Type of Home: House Home Access: Stairs to enter Entrance Stairs-Rails: None Entrance Stairs-Number of Steps: 1 Home Layout: One level Home Equipment: Cane - single point;Walker - 2 wheels      Prior Function Level of Independence: Independent with assistive device(s)         Comments: States she could do all ADL's herself but took her a long time     Hand Dominance   Dominant Hand: Right    Extremity/Trunk  Assessment   Upper Extremity Assessment: RUE deficits/detail;LUE deficits/detail RUE Deficits / Details: Pt reports pain in shoulder and arm. States she needs surgery on that side but does not want to "risk it".      LUE Deficits / Details: States she had surgery on her arm years ago and it has never been the same. Reports pain from shoulder down to wrist with movement.    Lower Extremity Assessment: Generalized  weakness      Cervical / Trunk Assessment: Kyphotic  Communication   Communication: No difficulties  Cognition Arousal/Alertness: Awake/alert Behavior During Therapy: WFL for tasks assessed/performed Overall Cognitive Status: Within Functional Limits for tasks assessed                      General Comments      Exercises        Assessment/Plan    PT Assessment Patient needs continued PT services  PT Diagnosis Difficulty walking;Generalized weakness   PT Problem List Decreased strength;Decreased range of motion;Decreased activity tolerance;Decreased balance;Decreased mobility;Decreased knowledge of use of DME;Decreased safety awareness;Decreased knowledge of precautions  PT Treatment Interventions DME instruction;Gait training;Stair training;Functional mobility training;Therapeutic activities;Therapeutic exercise;Neuromuscular re-education;Patient/family education   PT Goals (Current goals can be found in the Care Plan section) Acute Rehab PT Goals Patient Stated Goal: To drive again PT Goal Formulation: With patient Time For Goal Achievement: 03/26/14 Potential to Achieve Goals: Good    Frequency Min 3X/week   Barriers to discharge Decreased caregiver support Pt's sister lives with her, however pt states she does not have 24 hour assist if she needs it.     Co-evaluation               End of Session Equipment Utilized During Treatment: Gait belt;Oxygen Activity Tolerance: Patient tolerated treatment well Patient left: in chair;with call bell/phone within reach;with nursing/sitter in room Nurse Communication: Mobility status         Time: 0930-1000 PT Time Calculation (min): 30 min   Charges:   PT Evaluation $Initial PT Evaluation Tier I: 1 Procedure PT Treatments $Therapeutic Activity: 8-22 mins   PT G CodesJolyn Lent 03/19/2014, 10:19 AM  Jolyn Lent, PT, DPT Acute Rehabilitation Services Pager: [phone]

## 2014-03-19 NOTE — Progress Notes (Signed)
[facility]  MARABELLA POPIEL QQP:[id number] DOB: August 03, 1941 DOA: 03/16/2014 PCP: Leola Brazil, MD  Time spent : 35 minutes  Brief narrative: 73 y.o. F with Hx anxiety, COPD not on home oxygen, HTN, psoriatic arthritis on chronic alternate day prednisone, Barrett's esophagus status post stretching 7-8 years ago, GERD, cirrhosis, PAD, DM 2, gout, normal nuclear stress test January 2014.  Presented to the ED w/ c/o abdominal distention, heartburn, difficulty breathing, rectal bleeding and black stools. She gave a 7-10 day history of intermittent and progressively worsening abdominal distention, 2-3 loose BMs daily, intermittent bright red rectal bleeding that she attributed to her hemorrhoids, and "black tarry stools" beginning 2-3 days pior to presnetation. She takes a baby aspirin and when necessary Aleve for her several arthritic pains. Prior to comimg to the ER she went to use the toilet and started having palpitations and worsening dyspnea which did not subside with use of her home inhaler. She denied radiation of her "heartburn" pain.   In the ED her hemoglobin was 6.2 (compared to 10 a couple months ago), MCV 69, POC troponin 0.11, EKG with mild T wave inversions in lateral leads and chest x-ray without acute findings. Rectal exam by EDP showed brown stools and FOBT +. GI has been consulted by EDP.   HPI/Subjective: States feels much better than prior to admission with complete resolution of symptoms. No chest pain or shortness of breath.  She has not noticed any bleeding.  Assessment/Plan:  Acute blood loss anemia on chronic anemia  -4/19 s/p 2U PRBC  -Hgb nadir of 6.2 - Hgb improved to 9.1 following transfusion but subsequently has drifted down to around 8.2 but remains stable  GIB - BRBPR + melena  -No further melena or other GI bleed symptoms noted or endorsed  -Occult blood card completed in ER positive  -Patient does take prednisone  and occasional NSAIDs so continue PPI  Elevated troponin  -No acute EKG changes - no typical CP sx - likely demand ischemia from severe anemia - Nuclear stress test in January 2014 was low risk - unable to use aspirin or heparin secondary to GI bleed  -The last 3 troponin levels have been normal   Abdominal distention  -KUB unrevealing - exam benign - follow for now   COPD  -Not on home O2 - no wheezing on exam  -Can continue home medication  Acute respiratory failure with hypoxia /Chronic Systolic and Diastolic CHF  -Home medications held due to hypotension in setting of anemia -BP has recovered but remains soft so continue to hold Lasix  HTN  -Home medications include Apresoline and Lasix which currently are on hold due to blood pressure reading  GERD - Hx of Barrett's esoph  -EGD Nondiagnostic for bleed  -if hemoglobin begins to drop again will request colonoscopy from GI   Hx of Non-EtOH cirrhosis  LFTs normal - albumin modestly depressed - coags normal   DM2 - uncontrolled  -Cont SSI and follow  -Utilized Amaryl and Glucophage at home -GFR in range consistent with chronic kidney disease stage III - need to consider alternate agent for Glucophage at time of discharge  Chronic kidney disease stage III -At baseline  PVD  -s/p L SFA stenting 12/10/12 - L TMA Feb 2014  -Asymptomatic  Psoriatic arthritis on chronic prednisone  -Cont daily steroid - no evidence of addisonian crisis at present   DVT prophylaxis: SCDs Code Status: Full Family Communication:  No family at bedside Disposition Plan/Expected LOS: Transfer to floor - continue to follow hemoglobin - begin physical and occupational therapy  Consultants: Gastroenterology  Procedures: EGD 1. Normal EGD  2. Iron deficiency anemia and Hemoccult-positive stool. No acute  bleeding. May have small bowel or colonic AVMs.  3. Colonoscopy 2007 without neoplasia  Antibiotics: None  Objective: Blood pressure  128/65, pulse 81, temperature 98.6 F (37 C), temperature source Oral, resp. rate 20, height 5\' 4"  (1.626 m), weight 166 lb 0.1 oz (75.3 kg), SpO2 98.00%.  Intake/Output Summary (Last 24 hours) at 03/19/14 1438 Last data filed at 03/19/14 1144  Gross per 24 hour  Intake 1108.75 ml  Output    450 ml  Net 658.75 ml   Exam: General: No acute respiratory distress Lungs: Clear to auscultation bilaterally without wheezes or crackles, 2L Cardiovascular: Regular rate and rhythm without murmur gallop or rub normal S1 and S2, no peripheral edema  Abdomen: Nontender, nondistended, soft, bowel sounds positive, no rebound, no ascites, no appreciable mass Musculoskeletal: No significant cyanosis, clubbing of bilateral lower extremities Neurological: Alert and oriented x 3, moves all extremities x 4 without focal neurological deficits, CN 2-12 intact  Scheduled Meds:  Scheduled Meds: . budesonide-formoterol  2 puff Inhalation QPM  . cycloSPORINE  1 drop Both Eyes BID  . insulin aspart  0-5 Units Subcutaneous QHS  . insulin aspart  0-9 Units Subcutaneous TID WC  . latanoprost  1 drop Both Eyes QHS  . pantoprazole  40 mg Oral BID AC  . polyethylene glycol  17 g Oral Daily  . predniSONE  5 mg Oral Daily  . senna  2 tablet Oral Daily  . sodium chloride  3 mL Intravenous Q12H  . tiotropium  18 mcg Inhalation Daily   Data Reviewed: Basic Metabolic Panel:  Recent Labs Lab 03/16/14 1243 03/17/14 0430 03/18/14 0330 03/19/14 0543  NA 138 138 141 142  K 4.6 5.0 4.3 5.5*  CL 103 102 104 107  CO2 22 23 25 26   GLUCOSE 210* 165* 173* 130*  BUN 14 12 11 16   CREATININE 0.94 0.96 0.94 1.09  CALCIUM 9.7 9.8 9.2 9.6  MG  --   --   --  2.1   Liver Function Tests:  Recent Labs Lab 03/16/14 1243 03/18/14 0330  AST 20 14  ALT 21 14  ALKPHOS 88 81  BILITOT 0.2* 0.4  PROT 6.6 6.3  ALBUMIN 3.2* 3.0*    Recent Labs Lab 03/16/14 1243  LIPASE 21   CBC:  Recent Labs Lab 03/16/14 1243  03/17/14 0430 03/17/14 1419 03/18/14 0330 03/18/14 0930 03/19/14 0543  WBC 10.5 11.3* 11.2* 10.5  --  10.3  HGB 6.2* 9.1* 8.8* 8.2* 8.3* 8.3*  HCT 22.9* 31.0* 29.5* 28.3* 29.0* 29.1*  MCV 68.6* 72.6* 72.5* 72.4*  --  74.2*  PLT 291 290 277 287  --  259   Cardiac Enzymes:  Recent Labs Lab 03/17/14 1031 03/17/14 1713 03/18/14 0330 03/18/14 2259 03/19/14 0543  TROPONINI 0.47* 0.36* 0.33* <0.30 <0.30   CBG:  Recent Labs Lab 03/18/14 1148 03/18/14 1704 03/18/14 2215 03/19/14 0805 03/19/14 1141  GLUCAP 156* 190* 197* 129* 217*    Recent Results (from the past 240 hour(s))  MRSA PCR SCREENING     Status: None   Collection Time    03/16/14  5:45 PM      Result Value Ref Range Status   MRSA by PCR NEGATIVE  NEGATIVE Final   Comment:  The GeneXpert MRSA Assay (FDA     approved for NASAL specimens     only), is one component of a     comprehensive MRSA colonization     surveillance program. It is not     intended to diagnose MRSA     infection nor to guide or     monitor treatment for     MRSA infections.     Studies:  Recent x-ray studies have been reviewed in detail by the Attending Physician      Erin Hearing, ANP [facility] Office  [phone] Pager [phone]  **If unable to reach the above provider after paging please contact the [place] @ [phone]  On-Call/Text Page:      [email or web address]      password [email or web address]  If 7PM-7AM, please contact night-coverage [email or web address] Password [email or web address] 03/19/2014, 2:38 PM   LOS: 3 days   I have personally examined this patient and reviewed the entire database. I have reviewed the above note, made any necessary editorial changes, and agree with its content.  Cherene Altes, MD [facility]

## 2014-03-19 NOTE — Progress Notes (Signed)
NURSING PROGRESS NOTE  BERNETTE SEEMAN [id number] Transfer Data: 03/19/2014 3:55 PM Attending Provider: Cherene Altes, MD JIR:[id number] Abran Cantor, MD Code Status: Full  [name] is a 73 y.o. female patient transferred from 2c  -No acute distress noted.  -No complaints of shortness of breath.  -No complaints of chest pain.    Blood pressure 128/65, pulse 81, temperature 98.6 F (37 C), temperature source Oral, resp. rate 20, height 5\' 4"  (1.626 m), weight 75.3 kg (166 lb 0.1 oz), SpO2 98.00%.   IV Fluids:  IV in place, occlusive dsg intact without redness, IV cath R arm, NS at 75ml/hr.  Allergies:  Amlodipine besy-benazepril hcl; Statins; Plavix; and Raptiva  Past Medical History:   has a past medical history of COPD (chronic obstructive pulmonary disease); Hypertension; Asthma; Psoriatic arthritis; Psoriasis; GERD (gastroesophageal reflux disease); Hiatal hernia; Chronic sinus infection; Heart murmur; Blood transfusion (~ 1962); Ankylosing spondylitis; Chronic back pain; Dysrhythmia; Esophageal dilatation; Candida esophagitis; Cirrhosis of liver without mention of alcohol; Hypercholesteremia; Peripheral vascular disease; Exertional dyspnea; Pneumonia (10/07/11; 2006; 1980's); Chronic bronchitis; Type II diabetes mellitus; Iron deficiency anemia; External bleeding hemorrhoids; Sinus headache; Arthritis; OA (osteoarthritis); DJD (degenerative joint disease) of knee; Carpal tunnel syndrome on right; Anxiety; Barrett esophagus; Non-healing ulcer of foot, secondary to diabetes and PVD (12/07/2012); PVD (peripheral vascular disease) (12/07/2012); Tobacco abuse (12/07/2012); Critical lower limb ischemia, with ulcer Lt foot (12/07/2012); Gout; COPD (chronic obstructive pulmonary disease); Carotid artery disease; PAD (peripheral artery disease); History of nuclear stress test (11/2012); and Dermatophytosis of nail ([id number]).  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  Social History:   reports that she has been smoking Cigarettes.  She has a 40 pack-year smoking history. She has never used smokeless tobacco. She reports that she does not drink alcohol or use illicit drugs.  Skin: Intact  Patient/Family orientated to room. Information packet given to patient/family. Admission inpatient armband information verified with patient/family to include name and date of birth and placed on patient arm. Side rails up x 2, fall assessment and education completed with patient/family. Patient/family able to verbalize understanding of risk associated with falls and verbalized understanding to call for assistance before getting out of bed. Call light within reach. Patient/family able to voice and demonstrate understanding of unit orientation instructions.    Will continue to evaluate and treat per MD orders.

## 2014-03-20 LAB — BASIC METABOLIC PANEL
BUN: 18 mg/dL (ref 6–23)
CO2: 28 mEq/L (ref 19–32)
Calcium: 9.6 mg/dL (ref 8.4–10.5)
Chloride: 104 mEq/L (ref 96–112)
Creatinine, Ser: 1.03 mg/dL (ref 0.50–1.10)
GFR calc Af Amer: 61 mL/min — ABNORMAL LOW (ref >90)
GFR, EST NON AFRICAN AMERICAN: 52 mL/min — AB (ref >90)
GLUCOSE: 118 mg/dL — AB (ref 70–99)
POTASSIUM: 5.3 meq/L (ref 3.7–5.3)
Sodium: 142 mEq/L (ref 137–147)

## 2014-03-20 LAB — CBC
HCT: 30 % — ABNORMAL LOW (ref 36.0–46.0)
HEMOGLOBIN: 8.4 g/dL — AB (ref 12.0–15.0)
MCH: 21.2 pg — AB (ref 26.0–34.0)
MCHC: 28 g/dL — ABNORMAL LOW (ref 30.0–36.0)
MCV: 75.6 fL — ABNORMAL LOW (ref 78.0–100.0)
Platelets: 269 10*3/uL (ref 150–400)
RBC: 3.97 MIL/uL (ref 3.87–5.11)
RDW: 24 % — AB (ref 11.5–15.5)
WBC: 7.9 10*3/uL (ref 4.0–10.5)

## 2014-03-20 LAB — GLUCOSE, CAPILLARY
GLUCOSE-CAPILLARY: 114 mg/dL — AB (ref 70–99)
GLUCOSE-CAPILLARY: 212 mg/dL — AB (ref 70–99)
Glucose-Capillary: 235 mg/dL — ABNORMAL HIGH (ref 70–99)
Glucose-Capillary: 181 mg/dL — ABNORMAL HIGH (ref 70–99)

## 2014-03-20 MED ORDER — FUROSEMIDE 20 MG PO TABS
20.0000 mg | ORAL_TABLET | Freq: Every day | ORAL | Status: DC
  Administered 2014-03-20 – 2014-03-21 (×2): 20 mg via ORAL
  Filled 2014-03-20 (×2): qty 1

## 2014-03-20 MED ORDER — IPRATROPIUM-ALBUTEROL 0.5-2.5 (3) MG/3ML IN SOLN
3.0000 mL | Freq: Three times a day (TID) | RESPIRATORY_TRACT | Status: DC
  Administered 2014-03-20 – 2014-03-21 (×2): 3 mL via RESPIRATORY_TRACT
  Filled 2014-03-20 (×3): qty 3

## 2014-03-20 NOTE — Progress Notes (Addendum)
Inpatient Diabetes Program Recommendations  AACE/ADA: New Consensus Statement on Inpatient Glycemic Control (2013)  Target Ranges:  Prepandial:   less than 140 mg/dL      Peak postprandial:   less than 180 mg/dL (1-2 hours)      Critically ill patients:  140 - 180 mg/dL     Results for MAVI, UN (MRN [id number]) as of 03/20/2014 07:47  Ref. Range 03/19/2014 08:05 03/19/2014 11:41 03/19/2014 16:54 03/19/2014 22:07  Glucose-Capillary Latest Range: 70-99 mg/dL 129 (H) 217 (H) 181 (H) 250 (H)     Patient with occasional glucose elevations.   MD- Please consider increasing Novolog SSI to Moderate scale tid ac + HS (currently ordered as Sensitive scale)  Noted patient may need alternative agent other than Metformin at time of d/c.  Could consider starting patient on Tradjenta 5 mg daily instead of Metformin (Tradjenta is a DPP-4 inhibitor- No renal dosing needed- Lower risk of hypoglycemia due to glucose dependent nature of Tradjenta)   Will follow Wyn Quaker RN, MSN, CDE Diabetes Coordinator Inpatient Diabetes Program Team Pager: [phone] (8a-10p)

## 2014-03-20 NOTE — Progress Notes (Signed)
[facility] PROGRESS NOTE  Peyton Bottoms ZOX:[id number] DOB: [birth year] DOA: 03/16/2014 PCP: Leola Brazil, MD  Assessment/Plan: 1. Acute blood loss anemia on chronic anemia   -4/19 status post 2U pack red blood cells   -Hgb of 6.2 on admission - Hgb improved to 9.1 following transfusion but subsequently has drifted down to around 8.2 but remains stable   -hgb 8.3 on 4/23. Patient is asymptomatic. 2. Gastrointestinal bleed- Occult Blood test +, Bright red blood   -Occult blood card completed in ER positive   -Patient reported occasional NSAIDs so continue PPI   -Patient reported normal looking stool, on 03/19/13   -No further melena or other GI bleed symptoms noted or stated by patient  3. Elevated troponin   -No acute EKG changes - no typical CP sx - likely demand ischemia from severe anemia   - Nuclear stress test in January 2014 was low risk   - Asprin/ heparin contraindicated do to GI bleed   -The last 3 troponin levels have been normal   -resolved  4. Abdominal distention   -KUB unrevealing - exam benign - 03/20/2014  5. COPD   -Not on home O2 - no wheezing on exam   -Can continue home medication  7.Acute respiratory failure with hypoxia /Chronic Systolic and Diastolic CHF   -Home medications held due to hypotension in setting of anemia   -Blood pressure has recovered, last bp 139/72         8. HTN   -Home medications include Apresoline and Lasix which currently are on hold due to blood pressure reading  9. GERD - Hx of Barrett's esoph   -Upper endoscopy revealed no acute abnormal ties or bleed.   -[facility] consulted - colonscopy-outpatient scheduled  10.DM2 - uncontrolled   -Cont SSI and follow   -Utilized Amaryl and Glucophage at home   - will discharge with instructions to follow up with pcp regarding possibly switching from metformin to possibly glipizide for sugar control due to CKD stage III  11.Chronic kidney disease stage III    -At baseline  12.Psoriatic arthritis on chronic prednisone   -Cont daily steroid - no evidence of addisonian crisis at present   Code Status: full Family Communication: none Disposition Plan: discharge to SNF tomorrow   Consultants:  GI  Procedures:  none  Antibiotics:  none  HPI/Subjective: This is a 73 year old female with a PMH of Candidal esophagitis, reflux esophagitis, and hiatal hernia who is admitted for complaints of chest and throat discomfort. She reports that her symptoms have been ongoing for the past several weeks and she denies any improvement with the use of antacids. No issues with vomiting. On 04/2010 and 10/2006 her EGDs revealed Candidal esophagitis and a 4 cm hiatal hernia. During her admission work up she was identified to have an anemia in the 6 range and heme positive stool. She reports intermittent issues with melena that is associated with hematochezia. The patient attributes the hematochezia to her hemorrhoids. Dr. Sharlett Iles did perform a colonoscopy in the past, but I do not have the report available. She also complains of abdominal distension and in the past she was identified to have a distended bladder that resulted in her symptoms. Once her bladder distension was relieved she was able to breathe comfortably.    Objective: Filed Vitals:   03/20/14 0601  BP: 139/72  Pulse: 69  Temp: 98.2 F (36.8 C)  Resp: 18    Intake/Output Summary (Last 24 hours)  at 03/20/14 1349 Last data filed at 03/20/14 1010  Gross per 24 hour  Intake    320 ml  Output      0 ml  Net    320 ml   Filed Weights   03/19/14 0400 03/19/14 1434 03/20/14 0601  Weight: 75.2 kg (165 lb 12.6 oz) 75.3 kg (166 lb 0.1 oz) 78.699 kg (173 lb 8 oz)    Exam:  Physical Exam  Nursing note and vitals reviewed. Constitutional: She is oriented to person, place, and time. She appears well-developed and well-nourished. No distress.  HENT:  Head: Normocephalic and atraumatic.   Eyes: Pupils are equal, round, and reactive to light.  Neck: No JVD present.  Cardiovascular: Normal rate, regular rhythm and normal heart sounds.   Respiratory: Effort normal and breath sounds normal. No stridor. No respiratory distress. She has no wheezes. She exhibits no tenderness.  GI: Soft. Bowel sounds are normal. She exhibits distension. There is no tenderness.  Musculoskeletal: Normal range of motion. She exhibits no tenderness.  Neurological: She is alert and oriented to person, place, and time.  Skin: Skin is warm and dry. She is not diaphoretic.  Psychiatric: She has a normal mood and affect. Her speech is normal and behavior is normal. Judgment and thought content normal. Cognition and memory are normal.    Data Reviewed: Basic Metabolic Panel:  Recent Labs Lab 03/16/14 1243 03/17/14 0430 03/18/14 0330 03/19/14 0543 03/20/14 0715  NA 138 138 141 142 142  K 4.6 5.0 4.3 5.5* 5.3  CL 103 102 104 107 104  CO2 22 23 25 26 28   GLUCOSE 210* 165* 173* 130* 118*  BUN 14 12 11 16 18   CREATININE 0.94 0.96 0.94 1.09 1.03  CALCIUM 9.7 9.8 9.2 9.6 9.6  MG  --   --   --  2.1  --    Liver Function Tests:  Recent Labs Lab 03/16/14 1243 03/18/14 0330  AST 20 14  ALT 21 14  ALKPHOS 88 81  BILITOT 0.2* 0.4  PROT 6.6 6.3  ALBUMIN 3.2* 3.0*    Recent Labs Lab 03/16/14 1243  LIPASE 21   CBC:  Recent Labs Lab 03/17/14 0430 03/17/14 1419 03/18/14 0330 03/18/14 0930 03/19/14 0543 03/20/14 0715  WBC 11.3* 11.2* 10.5  --  10.3 7.9  HGB 9.1* 8.8* 8.2* 8.3* 8.3* 8.4*  HCT 31.0* 29.5* 28.3* 29.0* 29.1* 30.0*  MCV 72.6* 72.5* 72.4*  --  74.2* 75.6*  PLT 290 277 287  --  259 269   Cardiac Enzymes:  Recent Labs Lab 03/17/14 1713 03/18/14 0330 03/18/14 2259 03/19/14 0543 03/19/14 1355  TROPONINI 0.36* 0.33* <0.30 <0.30 <0.30   CBG:  Recent Labs Lab 03/19/14 1141 03/19/14 1654 03/19/14 2207 03/20/14 0821 03/20/14 1207  GLUCAP 217* 181* 250* 114* 212*     Recent Results (from the past 240 hour(s))  MRSA PCR SCREENING     Status: None   Collection Time    03/16/14  5:45 PM      Result Value Ref Range Status   MRSA by PCR NEGATIVE  NEGATIVE Final   Comment:            The GeneXpert MRSA Assay (FDA     approved for NASAL specimens     only), is one component of a     comprehensive MRSA colonization     surveillance program. It is not     intended to diagnose MRSA     infection nor  to guide or     monitor treatment for     MRSA infections.     Studies:  Scheduled Meds: . budesonide-formoterol  2 puff Inhalation QPM  . cycloSPORINE  1 drop Both Eyes BID  . insulin aspart  0-5 Units Subcutaneous QHS  . insulin aspart  0-9 Units Subcutaneous TID WC  . latanoprost  1 drop Both Eyes QHS  . pantoprazole  40 mg Oral BID AC  . polyethylene glycol  17 g Oral Daily  . predniSONE  5 mg Oral Daily  . senna  2 tablet Oral Daily  . sodium chloride  3 mL Intravenous Q12H  . tiotropium  18 mcg Inhalation Daily   Continuous Infusions:   Principal Problem:   GI bleeding Active Problems:   DIABETES-TYPE 2   HYPERTENSION   GERD   Psoriatic arthritis   Barrett esophagus   Abdominal distention   Chest pain, atypical   COPD (chronic obstructive pulmonary disease)   GI bleed   Chronic combined systolic and diastolic CHF (congestive heart failure)   Time spent: [place], Student-PA Azzie Glatter, [place]    [facility]  If 7PM-7AM, please contact night-coverage at [email or web address], password [facility] 03/20/2014, 1:49 PM  LOS: 4 days   Attending Seen And examined, agree with the above assessment and plan. Await evaluation by physical therapy, lives alone, likely will need SNF. Hemoglobin stable, no further evidence of GI bleeding.  Nena Alexander MD

## 2014-03-20 NOTE — Discharge Summary (Signed)
Physician Discharge Summary  [name] ZSW:[id number] DOB: [birth year] DOA: 03/16/2014  PCP: Shelby Brazil, MD  Admit date: 03/16/2014 Discharge date: 03/21/2014  Time spent: 45 minutes  Recommendations for Outpatient Follow-up:  1.  Follow up with Dr. Hilarie Harding for colonoscopy for abdominal bleed. 2.  Follow up with Shelby Abran Cantor, MD for diabetes managament.  Hgb A1c in 6 weeks. 3.  D/C to home with home health physical therapy and oxygen at 2 liters continuous.   4. BMET / CBC in 1 week.  Discharge Diagnoses:  Principal Problem:   GI bleeding Active Problems:   DIABETES-TYPE 2   HYPERTENSION   GERD   Psoriatic arthritis   Barrett esophagus   Abdominal distention   Chest pain, atypical   COPD (chronic obstructive pulmonary disease)   GI bleed   Chronic combined systolic and diastolic CHF (congestive heart failure)   Discharge Condition: stable  Diet recommendation: low sodium heart healthy  Filed Weights   03/19/14 1434 03/20/14 0601 03/21/14 0519  Weight: 75.3 kg (166 lb 0.1 oz) 78.699 kg (173 lb 8 oz) 78.563 kg (173 lb 3.2 oz)    History of present illness at the time of admission:  [name] is a 73 y.o. female with extensive PMH of COPD, HTN, psoriatic arthritis on chronic alternate day prednisone, Barrett's esophagus status post stretching 7-8 years ago, GERD, cirrhosis, PAD, DM 2, anxiety, gout, normal nuclear stress test January 2014, presented to the ED with complaints of chest pain and throat discomfort  She gives a 7-10 day history of intermittent and progressively worsening abdominal distention, 2-3 loose BMs daily, intermittent bright red rectal bleeding that she attributed to her hemorrhoids, "black tarry stools" 2-3 days ago, and dyspnea which she attributes to abdominal distention pressing on her chest, "heartburn" with sensation of choking, difficulty swallowing both solids and liquids, poor appetite, nausea but no vomiting. She does take a  baby aspirin and when necessary Aleve for her several arthritic pains. Today she went to use the toilet and started having palpitations and worsening dyspnea which did not subside with her home inhaler.  In the ED, hemoglobin was 6.2 (compared to 10 a couple months ago), MCV 69, POC troponin 0.11, EKG with mild T wave inversions in lateral leads and chest x-ray without acute findings. Rectal exam by EDP showed brown stools and FOBT +. She was admitted to the step down unit.  Hospital Course:  Acute blood loss anemia on chronic anemia  -She received  2U pack red blood cells during this admission. -Hgb of 6.2 on admission - Hgb improved to 9.1 following transfusion but has leveled out at 8.2. -Patient is asymptomatic  Gastrointestinal bleed-  Occult Blood test +,  Bright red blood -Patient reported occasional NSAIDs and significant reflux.  Will continue PPI   -Upper Endoscopy on 4/20 by Dr. Henrene Harding.  See results below. -No further melena or other GI bleed symptoms noted or stated by patient -Will follow up with GI, Dr. Hilarie Harding, outpatient for colonoscopy.  Elevated troponin  -No acute EKG changes - no typical CP sx -likely demand ischemia from severe anemia -Nuclear stress test in January 2014 was low risk -Asprin/ heparin contraindicated do to GI bleed -3 most recent troponin levels have been normal   Abdominal distention  -KUB unrevealing - exam benign -  03/20/2014  COPD  -Requires home oxygen to maintain oxygen saturations above 88%.  Will d/c on oxygen. Lower extremity Dopplers negative. -continue prior to admission home  medications as well. Symbicort, duonebs, flonase - From the history obtained- patient claims that in 2 instances in the past she has been told by her doctors thatt she required oxygen, she briefly needed nocturnal oxygen as well. She is willing to be discharged on home O2.  Chronic Systolic and Diastolic CHF  -lasix was held due to hypotension when initially admitted.  She remains clinically compensated at the time of discharge. -Blood pressure has recovered, last bp 139/72 -may resume home medications  HTN  -Home medications include Apresoline and Lasix were held initially due to hypotension, but have been resumed. -bp stable on home medications.  GERD - Hx of Barrett's esoph  -Upper endoscopy revealed no acute abnormal ties or bleed. -Continue PPIs.  DM2 - uncontrolled  Amaryl discontinued due to concerns for hypoglycemia. Continue Metformin. Added Tradjenta at discharge.  Chronic kidney disease stage III  -At baseline.   Psoriatic arthritis on chronic prednisone  -Cont daily steroid - no evidence of addisonian crisis. Stable.   Procedures:  Upper endoscopy 03/17/2014 Normal EGD 2. Iron deficiency anemia and Hemoccult-positive stool. No acute bleeding. May have small bowel or colonic AVMs. 3. Colonoscopy 2007 without neoplasia RECOMMENDATIONS: 1. Initiate Iron sulfate 325 mg twice a day by mouth 2. Outpatient GI followup with Dr. Hilarie Harding. May need colonoscopy and possible capsule endoscopy.  Consultations:  [facility].  Discharge Exam: Filed Vitals:   03/21/14 0519  BP: 134/65  Pulse:   Temp:   Resp:     Physical Exam  Nursing note and vitals reviewed. Constitutional: She is oriented to person, place, and time. She appears well-developed and well-nourished. No distress.  HENT:  Head: Normocephalic and atraumatic.  Eyes: Pupils are equal, round, and reactive to light.  Neck: No JVD present.  Cardiovascular: Normal rate, regular rhythm, normal heart sounds and intact distal pulses.  Exam reveals no gallop and no friction rub.   No murmur heard. Respiratory: Effort normal and breath sounds normal. No stridor. No respiratory distress. She has no wheezes. She exhibits no tenderness.  GI: Soft. Bowel sounds are normal. She exhibits distension. There is no tenderness. There is no rebound and no guarding.  Musculoskeletal: Normal  range of motion. She exhibits no tenderness.  Lymphadenopathy:    She has no cervical adenopathy.  Neurological: She is alert and oriented to person, place, and time.  Skin: Skin is warm and dry. No rash noted. She is not diaphoretic. No erythema.  Psychiatric: She has a normal mood and affect. Her speech is normal and behavior is normal. Judgment and thought content normal. Cognition and memory are normal.          Discharge Orders   Future Appointments Provider Department Dept Phone   04/16/2014 10:00 AM Kendell Bane, [place] [facility] [place]   05/05/2014 3:15 PM Irene Shipper, MD [facility] [phone]   Future Orders Complete By Expires   Diet - low sodium heart healthy  As directed    Increase activity slowly  As directed        Medication List    STOP taking these medications       glimepiride 2 MG tablet  Commonly known as:  AMARYL     naproxen sodium 220 MG tablet  Commonly known as:  ANAPROX      TAKE these medications       acetaminophen 325 MG tablet  Commonly known as:  TYLENOL  Take 2 tablets (650 mg total) by  mouth every 6 (six) hours as needed for mild pain (or Fever >/= 101).     albuterol 108 (90 BASE) MCG/ACT inhaler  Commonly known as:  PROVENTIL HFA;VENTOLIN HFA  Inhale 1 puff into the lungs every 6 (six) hours as needed. FOR SHORTNESS OF BREATH     ALPRAZolam 0.5 MG tablet  Commonly known as:  XANAX  Take 1 tablet (0.5 mg total) by mouth 2 (two) times daily as needed for anxiety or sleep.     ammonium lactate 12 % lotion  Commonly known as:  LAC-HYDRIN  Apply 1 application topically as needed for dry skin (psorias).     aspirin EC 81 MG tablet  Take 81 mg by mouth daily.     Clobetasol Propionate 0.05 % external spray  Commonly known as:  TEMOVATE  Apply 1 spray topically 2 (two) times daily as needed. For psoriasis flare-ups     esomeprazole 40 MG capsule  Commonly known as:  NEXIUM   Take 40 mg by mouth daily before breakfast.     EYE VITAMINS Caps  Take 1 capsule by mouth daily.     feeding supplement (GLUCERNA SHAKE) Liqd  Take 237 mLs by mouth 2 (two) times daily between meals.     ferrous sulfate 325 (65 FE) MG tablet  Take 1 tablet (325 mg total) by mouth 2 (two) times daily with a meal.     fluticasone 50 MCG/ACT nasal spray  Commonly known as:  FLONASE  Place 2 sprays into both nostrils daily as needed for allergies.     furosemide 40 MG tablet  Commonly known as:  LASIX  Take 20 mg by mouth daily.     hydrALAZINE 10 MG tablet  Commonly known as:  APRESOLINE  Take 1 tablet (10 mg total) by mouth every 8 (eight) hours.     ipratropium-albuterol 0.5-2.5 (3) MG/3ML Soln  Commonly known as:  DUONEB  Take 3 mLs by nebulization every 4 (four) hours as needed (shortness of breath).     linagliptin 5 MG Tabs tablet  Commonly known as:  TRADJENTA  Take 1 tablet (5 mg total) by mouth daily.     metFORMIN 500 MG tablet  Commonly known as:  GLUCOPHAGE  Take 500 mg by mouth 2 (two) times daily with a meal.     ondansetron 4 MG tablet  Commonly known as:  ZOFRAN  Take 1 tablet (4 mg total) by mouth every 8 (eight) hours as needed for nausea or vomiting.     ONE TOUCH ULTRA TEST test strip  Generic drug:  glucose blood     predniSONE 5 MG tablet  Commonly known as:  DELTASONE  Take 5 mg by mouth daily. On Monday, Wednesday, Friday     ranitidine 150 MG capsule  Commonly known as:  ZANTAC  Take 150 mg by mouth at bedtime.     RESTASIS 0.05 % ophthalmic emulsion  Generic drug:  cycloSPORINE  Place 1 drop into both eyes 2 (two) times daily.     senna 8.6 MG Tabs tablet  Commonly known as:  SENOKOT  Take 1 tablet (8.6 mg total) by mouth 2 (two) times daily.     SIMBRINZA 1-0.2 % Susp  Generic drug:  Brinzolamide-Brimonidine  Place 1 drop into the right eye 3 (three) times daily.     simethicone 80 MG chewable tablet  Commonly known as:   MYLICON  Chew 1 tablet (80 mg total) by mouth 4 (four) times daily  as needed for flatulence.     SYMBICORT 160-4.5 MCG/ACT inhaler  Generic drug:  budesonide-formoterol  Inhale 2 puffs into the lungs every evening.     tiotropium 18 MCG inhalation capsule  Commonly known as:  SPIRIVA  Place 1 capsule (18 mcg total) into inhaler and inhale daily.     Travoprost (BAK Free) 0.004 % Soln ophthalmic solution  Commonly known as:  TRAVATAN  Place 1 drop into both eyes at bedtime.       Allergies  Allergen Reactions  . Amlodipine Besy-Benazepril Hcl Other (See Comments)    Nervous/shakiness  . Statins Other (See Comments)    "can't take any of them; cramp me up" (12/06/2012)  . Plavix [Clopidogrel] Rash    Pt states that the doctor told her that her rash was psoriasis and not related to plavix but she states the rash came from the plavix  . Raptiva [Efalizumab] Rash   Follow-up Information   Follow up with Shelby JR,[name] R, MD In 1 week. (As needed)    Specialty:  Pulmonary Disease   Contact information:   [place] [address] [phone]       Follow up with Jerene Bears, MD In 1 week.   Specialty:  Gastroenterology   Contact information:   [address] [address] [phone]        The results of significant diagnostics from this hospitalization (including imaging, microbiology, ancillary and laboratory) are listed below for reference.    Significant Diagnostic Studies: Dg Chest Portable 1 View  03/16/2014   CLINICAL DATA:  Shortness of breath. Panic attack. COPD. Exertional asthma.  EXAM: PORTABLE CHEST - 1 VIEW  COMPARISON:  12/01/2013  FINDINGS: Mild to moderate cardiomegaly is stable. Both lungs remain clear. No evidence of pleural effusion. Mild tortuosity of thoracic aorta appears stable.  IMPRESSION: Stable cardiomegaly.  No acute findings.   Electronically Signed   By: Earle Gell M.D.   On: 03/16/2014 12:05   Dg Abd  Portable 1v  03/16/2014   CLINICAL DATA:  Abdominal distention and discomfort.  EXAM: PORTABLE ABDOMEN - 1 VIEW  COMPARISON:  Abdominal radiograph 12/04/2013.  FINDINGS: Gas and stool are seen scattered throughout the colon extending to the level of the distal rectum. No pathologic distension of small bowel is noted. No gross evidence of pneumoperitoneum.  IMPRESSION: 1.  Nonobstructive bowel gas pattern. 2. No pneumoperitoneum.   Electronically Signed   By: Vinnie Langton M.D.   On: 03/16/2014 17:04    Microbiology: Recent Results (from the past 240 hour(s))  MRSA PCR SCREENING     Status: None   Collection Time    03/16/14  5:45 PM      Result Value Ref Range Status   MRSA by PCR NEGATIVE  NEGATIVE Final   Comment:            The GeneXpert MRSA Assay (FDA     approved for NASAL specimens     only), is one component of a     comprehensive MRSA colonization     surveillance program. It is not     intended to diagnose MRSA     infection nor to guide or     monitor treatment for     MRSA infections.     Labs: Basic Metabolic Panel:  Recent Labs Lab 03/16/14 1243 03/17/14 0430 03/18/14 0330 03/19/14 0543 03/20/14 0715  NA 138 138 141 142 142  K 4.6  5.0 4.3 5.5* 5.3  CL 103 102 104 107 104  CO2 22 23 25 26 28   GLUCOSE 210* 165* 173* 130* 118*  BUN 14 12 11 16 18   CREATININE 0.94 0.96 0.94 1.09 1.03  CALCIUM 9.7 9.8 9.2 9.6 9.6  MG  --   --   --  2.1  --    Liver Function Tests:  Recent Labs Lab 03/16/14 1243 03/18/14 0330  AST 20 14  ALT 21 14  ALKPHOS 88 81  BILITOT 0.2* 0.4  PROT 6.6 6.3  ALBUMIN 3.2* 3.0*    Recent Labs Lab 03/16/14 1243  LIPASE 21  CBC:  Recent Labs Lab 03/17/14 0430 03/17/14 1419 03/18/14 0330 03/18/14 0930 03/19/14 0543 03/20/14 0715 03/21/14 0628  WBC 11.3* 11.2* 10.5  --  10.3 7.9  --   HGB 9.1* 8.8* 8.2* 8.3* 8.3* 8.4* 8.3*  HCT 31.0* 29.5* 28.3* 29.0* 29.1* 30.0* 29.2*  MCV 72.6* 72.5* 72.4*  --  74.2* 75.6*  --    PLT 290 277 287  --  259 269  --    Cardiac Enzymes:  Recent Labs Lab 03/17/14 1713 03/18/14 0330 03/18/14 2259 03/19/14 0543 03/19/14 1355  TROPONINI 0.36* 0.33* <0.30 <0.30 <0.30  CBG:  Recent Labs Lab 03/20/14 0821 03/20/14 1207 03/20/14 1745 03/20/14 2230 03/21/14 0810  GLUCAP 114* 212* 235* 181* 145*       Signed:  Denzil Hughes [place] [facility] [phone] [facility] 03/21/2014, 11:58 AM  Attending Patient seen and examined, agree with the above assessment and plan. No further melanotic stools, hemoglobin stable. Will need home O2, has history of COPD. Lower extremity Dopplers negative. Apparently, patient claims that in the past her primary care practitioner's have explained to her that she will need oxygen, however she she had refused. She is now willing discharged on oxygen. Rest of the issues are outlined as above. She will followup with [facility] further continued workup including a colonoscopy.  Nena Alexander MD

## 2014-03-20 NOTE — Progress Notes (Signed)
SATURATION QUALIFICATIONS: (This note is used to comply with regulatory documentation for home oxygen)  Patient Saturations on Room Air at Rest = 90%  Patient Saturations on Room Air while Ambulating = 85%  Patient Saturations on 2 Liters of oxygen while Ambulating = 92%  Please briefly explain why patient needs home oxygen: hypoxic ambulation on room air  [place], [place] 03/20/2014

## 2014-03-20 NOTE — Care Management Note (Signed)
    Page 1 of 2   03/21/2014     4:51:55 PM CARE MANAGEMENT NOTE 03/21/2014  Patient:  [name], [name]   Account Number:  [id number]  Date Initiated:  03/20/2014  Documentation initiated by:  Tomi Bamberger  Subjective/Objective Assessment:   dx gib, anemia,  admit- lives with sister.     Action/Plan:   pt eval- rec hhpt and rolling walker.  Will need home oxygen.   Anticipated DC Date:  03/21/2014   Anticipated DC Plan:  [place]  CM consult      PAC Choice  [place]   Choice offered to / List presented to:  C-1 Patient   DME arranged  [facility]      DME agency  [place] arranged  [place] RN      [place].   Status of service:  Completed, signed off Medicare Important Message given?   (If response is "NO", the following Medicare IM given date fields will be blank) Date Medicare IM given:   Date Additional Medicare IM given:    Discharge Disposition:  [place]  Per UR Regulation:  Reviewed for med. necessity/level of care/duration of stay  If discussed at [place] of Stay Meetings, dates discussed:    Comments:  03/21/14 [place], BSN [phone] patient for dc today, [facility] will deliver oxygen to patient's home, [place] In[place] brought tank up to patient's room.  03/20/14 [place], BSN [phone] patient lives with sister, per physical therapy recs hhpt and rolling walker.  Patient will also need home oxygen, patient for dc on 4/24.  Patient chose [facility] for [facility] and hhpt and rolling walker and oxygen.  Soc will begin 24-48 hrs post discharge.  Will need oxygen orders in chart.

## 2014-03-20 NOTE — Progress Notes (Signed)
Physical Therapy Treatment Patient Details Name: [name] MRN: [id number] DOB: 08/12/1941 Today's Date: 03/20/2014    History of Present Illness Pt is a 73 y/o female admitted s/p presenting to ED with abdominal distention, heartburn, difficulty breathing, rectal bleeding and black stools. Was found to have a + GI bleed.    PT Comments    Patient progressing needing less assist for transfers and ambulation today and walking farther distance.  Still desaturates ambulating on room air and will need home O2.  Also should be safe to mobilize independent while sister at work so no need for SNF at this time.  Agree with HHPT.  Follow Up Recommendations  Home health PT;Supervision - Intermittent     Equipment Recommendations  None recommended by PT (states already has a walker, needs home O2)    Recommendations for Other Services       Precautions / Restrictions Precautions Precautions: Fall    Mobility  Bed Mobility               General bed mobility comments: Pt up in recliner.  Transfers   Equipment used: Rolling walker (2 wheeled) Transfers: Sit to/from Stand Sit to Stand: Modified independent (Device/Increase time)            Ambulation/Gait Ambulation/Gait assistance: Modified independent (Device/Increase time) Ambulation Distance (Feet): 90 Feet Assistive device: Rolling walker (2 wheeled) Gait Pattern/deviations: Step-through pattern;Decreased stride length     General Gait Details: ambulated on room air, SpO2 dropped to 85%; increased to 91% seated rest   Stairs            Wheelchair Mobility    Modified Rankin (Stroke Patients Only)       Balance Overall balance assessment: Needs assistance         Standing balance support: No upper extremity supported Standing balance-Leahy Scale: Fair Standing balance comment: stands without UE assist without LOB, able to toilet in bathroom independent, still needs UE assist for  ambulation                    Cognition Arousal/Alertness: Awake/alert Behavior During Therapy: WFL for tasks assessed/performed Overall Cognitive Status: Within Functional Limits for tasks assessed                      Exercises      General Comments        Pertinent Vitals/Pain Min c/o bloating in abdomen; SpO2 91% on room air at rest, 85% with ambulation on room air; up to 92% with O2    Home Living                      Prior Function            PT Goals (current goals can now be found in the care plan section) Progress towards PT goals: Progressing toward goals    Frequency  Min 3X/week    PT Plan Current plan remains appropriate    Co-evaluation             End of Session Equipment Utilized During Treatment: Oxygen Activity Tolerance: Patient tolerated treatment well Patient left: in chair;with call bell/phone within reach     Time: 1525-1554 PT Time Calculation (min): 29 min  Charges:  $Gait Training: 8-22 mins $Self Care/Home Management: 8-22                    G Codes:  Max Sane 03/20/2014, 4:31 PM Magda Kiel, [place] 03/20/2014

## 2014-03-21 DIAGNOSIS — M7989 Other specified soft tissue disorders: Secondary | ICD-10-CM

## 2014-03-21 LAB — HEMOGLOBIN AND HEMATOCRIT, BLOOD
HCT: 29.2 % — ABNORMAL LOW (ref 36.0–46.0)
Hemoglobin: 8.3 g/dL — ABNORMAL LOW (ref 12.0–15.0)

## 2014-03-21 LAB — GLUCOSE, CAPILLARY
GLUCOSE-CAPILLARY: 145 mg/dL — AB (ref 70–99)
Glucose-Capillary: 210 mg/dL — ABNORMAL HIGH (ref 70–99)

## 2014-03-21 MED ORDER — ONDANSETRON HCL 4 MG PO TABS: 4.0000 mg | ORAL_TABLET | Freq: Three times a day (TID) | ORAL | Status: DC | PRN

## 2014-03-21 MED ORDER — LINAGLIPTIN 5 MG PO TABS: 5.0000 mg | ORAL_TABLET | Freq: Every day | ORAL | Status: DC

## 2014-03-21 MED ORDER — FERROUS SULFATE 325 (65 FE) MG PO TABS: 325.0000 mg | ORAL_TABLET | Freq: Two times a day (BID) | ORAL | Status: DC

## 2014-03-21 NOTE — Progress Notes (Signed)
Pt discharged per written instructions. All discharge orders/instructions/prescriptions/follow up care/appts discussed. Signs of and symptoms of GI bleed discussed. Teach back methods utilized. Time allowed for questions and concerns. Pt states she has none at this time. Pt's sister Clarene Critchley to transport home and will arrive at approx. 1330. Pt will leave the unit via wheelchair at that time. Will continue to monitor until then. Soyla Dryer, RN

## 2014-03-21 NOTE — Discharge Instructions (Signed)
Abdominal Pain, Adult Many things can cause belly (abdominal) pain. Most times, the belly pain is not dangerous. Many cases of belly pain can be watched and treated at home. HOME CARE   Do not take medicines that help you go poop (laxatives) unless told to by your doctor.  Only take medicine as told by your doctor.  Eat or drink as told by your doctor. Your doctor will tell you if you should be on a special diet. GET HELP IF:  You do not know what is causing your belly pain.  You have belly pain while you are sick to your stomach (nauseous) or have runny poop (diarrhea).  You have pain while you pee or poop.  Your belly pain wakes you up at night.  You have belly pain that gets worse or better when you eat.  You have belly pain that gets worse when you eat fatty foods. GET HELP RIGHT AWAY IF:   The pain does not go away within 2 hours.  You have a fever.  You keep throwing up (vomiting).  The pain changes and is only in the right or left part of the belly.  You have bloody or tarry looking poop. MAKE SURE YOU:   Understand these instructions.  Will watch your condition.  Will get help right away if you are not doing well or get worse. Document Released: 05/02/2008 Document Revised: 09/04/2013 Document Reviewed: 07/24/2013 [facility] Patient Information 2014 [place].   AMARYL HAS BEEN DISCONTINUED AS IT MAY CAUSE HYPOGLYCEMIA (LOW BLOOD SUGAR) TRADJENTA HAS BEEN STARTED IN ITS PLACE.  You will be discharged on IRON twice a day with meals.  It tends to cause constipation to use a stool softener (colace) daily while taking it.  You will see Dr. Hilarie Fredrickson in follow up.  However if you have black stools or bright red blood per rectum - call Dr. Vena Rua office to be seen earlier or return to the Emergency Department.

## 2014-03-21 NOTE — Progress Notes (Signed)
*  PRELIMINARY RESULTS* Vascular Ultrasound Lower extremity venous duplex has been completed.  Preliminary findings: No evidence of DVT bilaterally. Left baker's cyst noted.   Landry Mellow, RDMS, RVT  03/21/2014, 10:30 AM

## 2014-03-24 ENCOUNTER — Telehealth: Payer: Self-pay | Admitting: Internal Medicine

## 2014-03-24 NOTE — Telephone Encounter (Signed)
Stay on iron twice daily. Not imperative that she be seen immediately. June would be fine as scheduled. [name] would like to check her CBC one week prior to that appointment

## 2014-03-24 NOTE — Telephone Encounter (Signed)
Dr. Henrene Pastor this pt has an OV scheduled with you in June that was scheduled with Gerre Couch PA. Pt is stating that she was told to follow-up in a week. Does she need to see a midlevel prior to the June appt? Please advise.

## 2014-03-24 NOTE — Telephone Encounter (Signed)
Whatever the patient prefers is fine with me

## 2014-03-24 NOTE — Telephone Encounter (Signed)
Spoke with pt and she states that she was under the impression that she was supposed to follow-up with Dr. Hilarie Fredrickson per Dr. Buel Ream recommendation. Pt would like to see Dr. Hilarie Fredrickson. Dr. Henrene Pastor are you ok with pt rescheduling to see Dr. Hilarie Fredrickson? She originally had an appt with Dr. Hilarie Fredrickson in May. Please advise.

## 2014-03-24 NOTE — Telephone Encounter (Signed)
Called pt back and pt states that a nurse from [facility] called and moved her appt up with Dr. Henrene Pastor. Pt is now scheduled to see Dr. Henrene Pastor 04/02/14 and she would like to keep that appt.

## 2014-04-02 ENCOUNTER — Other Ambulatory Visit (INDEPENDENT_AMBULATORY_CARE_PROVIDER_SITE_OTHER): Payer: Medicare Other

## 2014-04-02 ENCOUNTER — Encounter: Payer: Self-pay | Admitting: Internal Medicine

## 2014-04-02 ENCOUNTER — Ambulatory Visit (INDEPENDENT_AMBULATORY_CARE_PROVIDER_SITE_OTHER): Payer: Medicare Other | Admitting: Internal Medicine

## 2014-04-02 ENCOUNTER — Other Ambulatory Visit: Payer: Self-pay

## 2014-04-02 VITALS — BP 124/72 | HR 82 | Ht 64.0 in | Wt 173.3 lb

## 2014-04-02 DIAGNOSIS — K219 Gastro-esophageal reflux disease without esophagitis: Secondary | ICD-10-CM

## 2014-04-02 DIAGNOSIS — D509 Iron deficiency anemia, unspecified: Secondary | ICD-10-CM

## 2014-04-02 DIAGNOSIS — E119 Type 2 diabetes mellitus without complications: Secondary | ICD-10-CM

## 2014-04-02 DIAGNOSIS — R195 Other fecal abnormalities: Secondary | ICD-10-CM

## 2014-04-02 DIAGNOSIS — D649 Anemia, unspecified: Secondary | ICD-10-CM

## 2014-04-02 LAB — CBC WITH DIFFERENTIAL/PLATELET
Basophils Absolute: 0 10*3/uL (ref 0.0–0.1)
Basophils Relative: 0.4 % (ref 0.0–3.0)
EOS ABS: 0.2 10*3/uL (ref 0.0–0.7)
Eosinophils Relative: 2.3 % (ref 0.0–5.0)
HCT: 27.9 % — ABNORMAL LOW (ref 36.0–46.0)
Lymphocytes Relative: 10.1 % — ABNORMAL LOW (ref 12.0–46.0)
Lymphs Abs: 1 10*3/uL (ref 0.7–4.0)
MCV: 72.4 fl — ABNORMAL LOW (ref 78.0–100.0)
MONO ABS: 0.8 10*3/uL (ref 0.1–1.0)
Monocytes Relative: 8.1 % (ref 3.0–12.0)
NEUTROS ABS: 8.1 10*3/uL — AB (ref 1.4–7.7)
Neutrophils Relative %: 79.1 % — ABNORMAL HIGH (ref 43.0–77.0)
Platelets: 271 10*3/uL (ref 150.0–400.0)
RBC: 3.85 Mil/uL — ABNORMAL LOW (ref 3.87–5.11)
RDW: 29.5 % — ABNORMAL HIGH (ref 11.5–15.5)
WBC: 10.2 10*3/uL (ref 4.0–10.5)

## 2014-04-02 MED ORDER — MOVIPREP 100 G PO SOLR: 1.0000 | Freq: Once | ORAL | Status: DC

## 2014-04-02 NOTE — Progress Notes (Signed)
HISTORY OF PRESENT ILLNESS:  [name] is a 73 y.o. female with multiple significant medical problems as listed below. She does not take oxygen for her COPD. I saw her in the hospital 03/17/2014 when she underwent upper endoscopy to evaluate anemia (iron deficiency) and Hemoccult-positive stool. The examination was normal. The patient did have prior colonoscopy in 2007 which was negative for neoplasia that examination was performed by Dr. Verl Blalock. She was noted to have hemorrhoids and diverticulosis. The patient was discharged from the hospital on iron sulfate twice daily. She has been compliant. She tells that she continues to feel fatigued. Her stools are dark secondary to iron. She denies melena or hematochezia. Her discharge hemoglobin on 03/21/2014 was 8.3. She does have a history of GERD, Nexium is effective. Other chronic complaints include bloating and gas. Occasional loose stool.  REVIEW OF SYSTEMS:  All non-GI ROS negative except for sinus and allergy, anxiety,, shortness of breath, skin rash secondary to psoriasis, ankle edema, arthritis, back pain, visual change, fatigue, headaches, heart murmur,  Past Medical History  Diagnosis Date  . COPD (chronic obstructive pulmonary disease)   . Hypertension   . Asthma   . Psoriatic arthritis   . Psoriasis   . GERD (gastroesophageal reflux disease)   . Hiatal hernia   . Chronic sinus infection   . Heart murmur   . Blood transfusion ~ 1962    "11 units of blood"  . Ankylosing spondylitis   . Chronic back pain   . Dysrhythmia     "palpitations"  . Esophageal dilatation   . Candida esophagitis   . Cirrhosis of liver without mention of alcohol   . Hypercholesteremia   . Peripheral vascular disease   . Exertional dyspnea   . Pneumonia 10/07/11; 2006; 1980's  . Chronic bronchitis     "since childhood" (12/06/2012)  . Type II diabetes mellitus   . Iron deficiency anemia   . External bleeding hemorrhoids   . Sinus headache    . Arthritis     "psorasic"  . OA (osteoarthritis)   . DJD (degenerative joint disease) of knee     "both" (12/06/2012)  . Carpal tunnel syndrome on right   . Anxiety   . Barrett esophagus   . Non-healing ulcer of foot, secondary to diabetes and PVD 12/07/2012  . PVD (peripheral vascular disease) 12/07/2012  . Tobacco abuse 12/07/2012  . Critical lower limb ischemia, with ulcer Lt foot 12/07/2012  . Gout   . COPD (chronic obstructive pulmonary disease)   . Carotid artery disease     dopplers showed mod L ICA stenosis   . PAD (peripheral artery disease)   . History of nuclear stress test 11/2012    lexiscan; normal, low risk   . Dermatophytosis of nail [id number]  . Psoriasis   . GI bleed   . Diverticulosis     Past Surgical History  Procedure Laterality Date  . Nasal sinus surgery  09/29/2009  . Carpal tunnel release  2011    left hand  . Lipoma excision  2010    "left occipital area"  . Cataract extraction w/ intraocular lens implant  2009/2010    right/left  . Eye surgery    . Oophorectomy      'not sure which one"  . Appendectomy    . Angioplasty  12/10/2012    "LLE"; LSFA Diamondback orbital rotational arthrectomy, PTA , stenting with IDEV stent (left ABI improved from 0.35 to 0.75)  . Abdominal  hysterectomy  1976  . Breast biopsy  1960's    "think he did both" (12/06/2012)  . 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 Esophagogastroduodenoscopy  2011  . Colonoscopy  2007    Hemorrhoids and Diverticulosis  . Esophagogastroduodenoscopy N/A 03/17/2014    Procedure: ESOPHAGOGASTRODUODENOSCOPY (EGD);  Surgeon: Irene Shipper, MD;  Location: [facility] ENDOSCOPY;  Service: Endoscopy;  Laterality: N/A;    Social History Taylore C Ashmore  reports that she has been smoking Cigarettes.  She has a 40 pack-year smoking history. She has never used smokeless  tobacco. She reports that she does not drink alcohol or use illicit drugs.  family history includes Arthritis in her brother and sister; Cancer in her mother and paternal grandfather; Diabetes in her brother; Heart Problems in her maternal grandmother; Lung cancer in her sister; Lupus in her sister; Thyroid disease in her sister. There is no history of Colon cancer.  Allergies  Allergen Reactions  . Amlodipine Besy-Benazepril Hcl Other (See Comments)    Nervous/shakiness  . Statins Other (See Comments)    "can't take any of them; cramp me up" (12/06/2012)  . Plavix [Clopidogrel] Rash    Pt states that the doctor told her that her rash was psoriasis and not related to plavix but she states the rash came from the plavix  . Raptiva [Efalizumab] Rash       PHYSICAL EXAMINATION: Vital signs: BP 124/72  Pulse 82  Ht 5\' 4"  (1.626 m)  Wt 173 lb 4.8 oz (78.608 kg)  BMI 29.73 kg/m2 General: Well-developed, well-nourished, no acute distress HEENT: Sclerae are anicteric, conjunctiva pink. Oral mucosa intact Lungs: Clear Heart: Regular Abdomen: soft, obese,  nontender, nondistended, no obvious ascites, no peritoneal signs, normal bowel sounds. No organomegaly. Extremities: No edema Psychiatric: alert and oriented x3. Cooperative   ASSESSMENT:  #1. Iron deficiency anemia and Hemoccult-positive stool. Normal recent endoscopy #2. GERD. Symptoms controlled with PPI #3. Diverticulosis on colonoscopy 2007. Hemorrhoids as well #4. Multiple medical problems. Stable   PLAN:  #1. Continue iron supplement #2. Followup CBC today #3. Colonoscopy to further evaluate iron deficiency anemia and Hemoccult-positive stool.The nature of the procedure, as well as the risks, benefits, and alternatives were carefully and thoroughly reviewed with the patient. Ample time for discussion and questions allowed. The patient understood, was satisfied, and agreed to proceed. #4. Movi prep prescribed. Patient  instructed on its use #5. If the above negative and no response to iron therapy, could consider capsule endoscopy or possible hematology evaluation #6. Hold diabetic medications the day of the examination to avoid hypoglycemia.

## 2014-04-02 NOTE — Patient Instructions (Signed)
Your physician has requested that you go to the basement for the following lab work before leaving today:  CBC  You have been scheduled for a colonoscopy with propofol. Please follow written instructions given to you at your visit today.  Please pick up your prep kit at the pharmacy within the next 1-3 days. If you use inhalers (even only as needed), please bring them with you on the day of your procedure. Your physician has requested that you go to www.startemmi.com and enter the access code given to you at your visit today. This web site gives a general overview about your procedure. However, you should still follow specific instructions given to you by our office regarding your preparation for the procedure.

## 2014-04-05 ENCOUNTER — Inpatient Hospital Stay (HOSPITAL_COMMUNITY): Payer: Medicare Other

## 2014-04-05 ENCOUNTER — Emergency Department (HOSPITAL_COMMUNITY): Payer: Medicare Other

## 2014-04-05 ENCOUNTER — Inpatient Hospital Stay (HOSPITAL_COMMUNITY)
Admission: EM | Admit: 2014-04-05 | Discharge: 2014-04-21 | DRG: 981 | Disposition: A | Discharge status: Home Health | Payer: Medicare Other | Attending: Cardiology | Admitting: Cardiology

## 2014-04-05 DIAGNOSIS — I509 Heart failure, unspecified: Secondary | ICD-10-CM

## 2014-04-05 DIAGNOSIS — R748 Abnormal levels of other serum enzymes: Secondary | ICD-10-CM | POA: Diagnosis present

## 2014-04-05 DIAGNOSIS — F17200 Nicotine dependence, unspecified, uncomplicated: Secondary | ICD-10-CM | POA: Diagnosis present

## 2014-04-05 DIAGNOSIS — I213 ST elevation (STEMI) myocardial infarction of unspecified site: Secondary | ICD-10-CM

## 2014-04-05 DIAGNOSIS — K922 Gastrointestinal hemorrhage, unspecified: Secondary | ICD-10-CM

## 2014-04-05 DIAGNOSIS — I5043 Acute on chronic combined systolic (congestive) and diastolic (congestive) heart failure: Secondary | ICD-10-CM

## 2014-04-05 DIAGNOSIS — J441 Chronic obstructive pulmonary disease with (acute) exacerbation: Principal | ICD-10-CM

## 2014-04-05 DIAGNOSIS — I6529 Occlusion and stenosis of unspecified carotid artery: Secondary | ICD-10-CM | POA: Diagnosis present

## 2014-04-05 DIAGNOSIS — I2510 Atherosclerotic heart disease of native coronary artery without angina pectoris: Secondary | ICD-10-CM | POA: Diagnosis present

## 2014-04-05 DIAGNOSIS — M1710 Unilateral primary osteoarthritis, unspecified knee: Secondary | ICD-10-CM | POA: Diagnosis present

## 2014-04-05 DIAGNOSIS — I5023 Acute on chronic systolic (congestive) heart failure: Secondary | ICD-10-CM

## 2014-04-05 DIAGNOSIS — M549 Dorsalgia, unspecified: Secondary | ICD-10-CM | POA: Diagnosis present

## 2014-04-05 DIAGNOSIS — E7800 Pure hypercholesterolemia, unspecified: Secondary | ICD-10-CM | POA: Diagnosis present

## 2014-04-05 DIAGNOSIS — J9602 Acute respiratory failure with hypercapnia: Secondary | ICD-10-CM

## 2014-04-05 DIAGNOSIS — L4050 Arthropathic psoriasis, unspecified: Secondary | ICD-10-CM

## 2014-04-05 DIAGNOSIS — R0789 Other chest pain: Secondary | ICD-10-CM

## 2014-04-05 DIAGNOSIS — N390 Urinary tract infection, site not specified: Secondary | ICD-10-CM

## 2014-04-05 DIAGNOSIS — J45901 Unspecified asthma with (acute) exacerbation: Principal | ICD-10-CM

## 2014-04-05 DIAGNOSIS — A498 Other bacterial infections of unspecified site: Secondary | ICD-10-CM | POA: Diagnosis not present

## 2014-04-05 DIAGNOSIS — K5900 Constipation, unspecified: Secondary | ICD-10-CM

## 2014-04-05 DIAGNOSIS — M069 Rheumatoid arthritis, unspecified: Secondary | ICD-10-CM | POA: Diagnosis present

## 2014-04-05 DIAGNOSIS — K5730 Diverticulosis of large intestine without perforation or abscess without bleeding: Secondary | ICD-10-CM | POA: Diagnosis present

## 2014-04-05 DIAGNOSIS — I249 Acute ischemic heart disease, unspecified: Secondary | ICD-10-CM

## 2014-04-05 DIAGNOSIS — G9340 Encephalopathy, unspecified: Secondary | ICD-10-CM | POA: Diagnosis present

## 2014-04-05 DIAGNOSIS — IMO0002 Reserved for concepts with insufficient information to code with codable children: Secondary | ICD-10-CM

## 2014-04-05 DIAGNOSIS — I2584 Coronary atherosclerosis due to calcified coronary lesion: Secondary | ICD-10-CM | POA: Diagnosis present

## 2014-04-05 DIAGNOSIS — I2109 ST elevation (STEMI) myocardial infarction involving other coronary artery of anterior wall: Secondary | ICD-10-CM | POA: Diagnosis not present

## 2014-04-05 DIAGNOSIS — N182 Chronic kidney disease, stage 2 (mild): Secondary | ICD-10-CM | POA: Diagnosis present

## 2014-04-05 DIAGNOSIS — I9589 Other hypotension: Secondary | ICD-10-CM | POA: Diagnosis not present

## 2014-04-05 DIAGNOSIS — I214 Non-ST elevation (NSTEMI) myocardial infarction: Secondary | ICD-10-CM

## 2014-04-05 DIAGNOSIS — D62 Acute posthemorrhagic anemia: Secondary | ICD-10-CM | POA: Diagnosis not present

## 2014-04-05 DIAGNOSIS — E2749 Other adrenocortical insufficiency: Secondary | ICD-10-CM | POA: Diagnosis present

## 2014-04-05 DIAGNOSIS — K219 Gastro-esophageal reflux disease without esophagitis: Secondary | ICD-10-CM

## 2014-04-05 DIAGNOSIS — T380X5A Adverse effect of glucocorticoids and synthetic analogues, initial encounter: Secondary | ICD-10-CM | POA: Diagnosis present

## 2014-04-05 DIAGNOSIS — Z801 Family history of malignant neoplasm of trachea, bronchus and lung: Secondary | ICD-10-CM

## 2014-04-05 DIAGNOSIS — I5042 Chronic combined systolic (congestive) and diastolic (congestive) heart failure: Secondary | ICD-10-CM

## 2014-04-05 DIAGNOSIS — J9600 Acute respiratory failure, unspecified whether with hypoxia or hypercapnia: Secondary | ICD-10-CM

## 2014-04-05 DIAGNOSIS — R140 Abdominal distension (gaseous): Secondary | ICD-10-CM

## 2014-04-05 DIAGNOSIS — R5381 Other malaise: Secondary | ICD-10-CM | POA: Diagnosis present

## 2014-04-05 DIAGNOSIS — J189 Pneumonia, unspecified organism: Secondary | ICD-10-CM | POA: Diagnosis present

## 2014-04-05 DIAGNOSIS — G8929 Other chronic pain: Secondary | ICD-10-CM | POA: Diagnosis present

## 2014-04-05 DIAGNOSIS — F411 Generalized anxiety disorder: Secondary | ICD-10-CM | POA: Diagnosis present

## 2014-04-05 DIAGNOSIS — M459 Ankylosing spondylitis of unspecified sites in spine: Secondary | ICD-10-CM | POA: Diagnosis present

## 2014-04-05 DIAGNOSIS — I272 Pulmonary hypertension, unspecified: Secondary | ICD-10-CM | POA: Diagnosis present

## 2014-04-05 DIAGNOSIS — E1169 Type 2 diabetes mellitus with other specified complication: Secondary | ICD-10-CM | POA: Diagnosis present

## 2014-04-05 DIAGNOSIS — K7460 Unspecified cirrhosis of liver: Secondary | ICD-10-CM | POA: Diagnosis present

## 2014-04-05 DIAGNOSIS — Z888 Allergy status to other drugs, medicaments and biological substances status: Secondary | ICD-10-CM

## 2014-04-05 DIAGNOSIS — I2789 Other specified pulmonary heart diseases: Secondary | ICD-10-CM | POA: Diagnosis present

## 2014-04-05 DIAGNOSIS — I2582 Chronic total occlusion of coronary artery: Secondary | ICD-10-CM | POA: Diagnosis present

## 2014-04-05 DIAGNOSIS — D509 Iron deficiency anemia, unspecified: Secondary | ICD-10-CM

## 2014-04-05 DIAGNOSIS — J9690 Respiratory failure, unspecified, unspecified whether with hypoxia or hypercapnia: Secondary | ICD-10-CM

## 2014-04-05 DIAGNOSIS — J9601 Acute respiratory failure with hypoxia: Secondary | ICD-10-CM | POA: Diagnosis present

## 2014-04-05 DIAGNOSIS — E874 Mixed disorder of acid-base balance: Secondary | ICD-10-CM | POA: Diagnosis present

## 2014-04-05 DIAGNOSIS — Z833 Family history of diabetes mellitus: Secondary | ICD-10-CM

## 2014-04-05 DIAGNOSIS — K2270 Barrett's esophagus without dysplasia: Secondary | ICD-10-CM

## 2014-04-05 DIAGNOSIS — J449 Chronic obstructive pulmonary disease, unspecified: Secondary | ICD-10-CM

## 2014-04-05 DIAGNOSIS — I2589 Other forms of chronic ischemic heart disease: Secondary | ICD-10-CM | POA: Diagnosis present

## 2014-04-05 DIAGNOSIS — E119 Type 2 diabetes mellitus without complications: Secondary | ICD-10-CM

## 2014-04-05 DIAGNOSIS — I10 Essential (primary) hypertension: Secondary | ICD-10-CM

## 2014-04-05 DIAGNOSIS — I739 Peripheral vascular disease, unspecified: Secondary | ICD-10-CM | POA: Diagnosis present

## 2014-04-05 DIAGNOSIS — I129 Hypertensive chronic kidney disease with stage 1 through stage 4 chronic kidney disease, or unspecified chronic kidney disease: Secondary | ICD-10-CM | POA: Diagnosis present

## 2014-04-05 DIAGNOSIS — I959 Hypotension, unspecified: Secondary | ICD-10-CM

## 2014-04-05 DIAGNOSIS — N179 Acute kidney failure, unspecified: Secondary | ICD-10-CM | POA: Diagnosis not present

## 2014-04-05 LAB — CBC WITH DIFFERENTIAL/PLATELET
BASOS ABS: 0 10*3/uL (ref 0.0–0.1)
Basophils Relative: 0 % (ref 0–1)
EOS ABS: 0.3 10*3/uL (ref 0.0–0.7)
Eosinophils Relative: 2 % (ref 0–5)
HCT: 29.5 % — ABNORMAL LOW (ref 36.0–46.0)
Hemoglobin: 8.1 g/dL — ABNORMAL LOW (ref 12.0–15.0)
LYMPHS PCT: 26 % (ref 12–46)
Lymphs Abs: 3.8 10*3/uL (ref 0.7–4.0)
MCH: 22.1 pg — ABNORMAL LOW (ref 26.0–34.0)
MCHC: 27.5 g/dL — AB (ref 30.0–36.0)
MCV: 80.6 fL (ref 78.0–100.0)
Monocytes Absolute: 0.9 10*3/uL (ref 0.1–1.0)
Monocytes Relative: 6 % (ref 3–12)
NEUTROS ABS: 9.8 10*3/uL — AB (ref 1.7–7.7)
Neutrophils Relative %: 66 % (ref 43–77)
PLATELETS: 323 10*3/uL (ref 150–400)
RBC: 3.66 MIL/uL — ABNORMAL LOW (ref 3.87–5.11)
RDW: 28.3 % — AB (ref 11.5–15.5)
WBC: 14.8 10*3/uL — ABNORMAL HIGH (ref 4.0–10.5)

## 2014-04-05 LAB — URINALYSIS, ROUTINE W REFLEX MICROSCOPIC
Bilirubin Urine: NEGATIVE
Glucose, UA: NEGATIVE mg/dL
Hgb urine dipstick: NEGATIVE
Ketones, ur: NEGATIVE mg/dL
Nitrite: NEGATIVE
Protein, ur: 100 mg/dL — AB
Specific Gravity, Urine: 1.012 (ref 1.005–1.030)
UROBILINOGEN UA: 0.2 mg/dL (ref 0.0–1.0)
pH: 7.5 (ref 5.0–8.0)

## 2014-04-05 LAB — CBG MONITORING, ED: GLUCOSE-CAPILLARY: 303 mg/dL — AB (ref 70–99)

## 2014-04-05 LAB — I-STAT ARTERIAL BLOOD GAS, ED
Acid-base deficit: 6 mmol/L — ABNORMAL HIGH (ref 0.0–2.0)
Acid-base deficit: 2 mmol/L (ref 0.0–2.0)
BICARBONATE: 23.6 meq/L (ref 20.0–24.0)
Bicarbonate: 23.8 mEq/L (ref 20.0–24.0)
O2 SAT: 100 %
O2 Saturation: 92 %
PH ART: 7.125 — AB (ref 7.350–7.450)
PH ART: 7.331 — AB (ref 7.350–7.450)
Patient temperature: 36.4
TCO2: 26 mmol/L (ref 0–100)
TCO2: 25 mmol/L (ref 0–100)
pCO2 arterial: 72.2 mmHg (ref 35.0–45.0)
pCO2 arterial: 44.4 mmHg (ref 35.0–45.0)
pO2, Arterial: 87 mmHg (ref 80.0–100.0)
pO2, Arterial: 183 mmHg — ABNORMAL HIGH (ref 80.0–100.0)

## 2014-04-05 LAB — GLUCOSE, CAPILLARY
GLUCOSE-CAPILLARY: 382 mg/dL — AB (ref 70–99)
GLUCOSE-CAPILLARY: 343 mg/dL — AB (ref 70–99)

## 2014-04-05 LAB — COMPREHENSIVE METABOLIC PANEL
ALT: 52 U/L — AB (ref 0–35)
AST: 82 U/L — ABNORMAL HIGH (ref 0–37)
Albumin: 2.9 g/dL — ABNORMAL LOW (ref 3.5–5.2)
Alkaline Phosphatase: 93 U/L (ref 39–117)
BUN: 15 mg/dL (ref 6–23)
CALCIUM: 8.4 mg/dL (ref 8.4–10.5)
CO2: 21 mEq/L (ref 19–32)
Chloride: 102 mEq/L (ref 96–112)
Creatinine, Ser: 1.16 mg/dL — ABNORMAL HIGH (ref 0.50–1.10)
GFR calc Af Amer: 52 mL/min — ABNORMAL LOW (ref >90)
GFR calc non Af Amer: 45 mL/min — ABNORMAL LOW (ref >90)
GLUCOSE: 341 mg/dL — AB (ref 70–99)
Potassium: 4.5 mEq/L (ref 3.7–5.3)
SODIUM: 141 meq/L (ref 137–147)
TOTAL PROTEIN: 6.1 g/dL (ref 6.0–8.3)
Total Bilirubin: 0.3 mg/dL (ref 0.3–1.2)

## 2014-04-05 LAB — MRSA PCR SCREENING: MRSA BY PCR: NEGATIVE

## 2014-04-05 LAB — URINE MICROSCOPIC-ADD ON

## 2014-04-05 LAB — TROPONIN I: Troponin I: <0.3 ng/mL (ref <0.30)

## 2014-04-05 LAB — I-STAT TROPONIN, ED: TROPONIN I, POC: 0.05 ng/mL (ref 0.00–0.08)

## 2014-04-05 LAB — PRO B NATRIURETIC PEPTIDE: PRO B NATRI PEPTIDE: 9378 pg/mL — AB (ref 0–125)

## 2014-04-05 LAB — I-STAT CG4 LACTIC ACID, ED: LACTIC ACID, VENOUS: 6.57 mmol/L — AB (ref 0.5–2.2)

## 2014-04-05 LAB — PROCALCITONIN: Procalcitonin: 0.88 ng/mL

## 2014-04-05 LAB — LACTIC ACID, PLASMA
Lactic Acid, Venous: 4.1 mmol/L — ABNORMAL HIGH (ref 0.5–2.2)
Lactic Acid, Venous: 3.8 mmol/L — ABNORMAL HIGH (ref 0.5–2.2)
Lactic Acid, Venous: 4.1 mmol/L — ABNORMAL HIGH (ref 0.5–2.2)

## 2014-04-05 LAB — HEPARIN LEVEL (UNFRACTIONATED): Heparin Unfractionated: 0.5 IU/mL (ref 0.30–0.70)

## 2014-04-05 MED ORDER — ASPIRIN 325 MG PO TABS
325.0000 mg | ORAL_TABLET | Freq: Every day | ORAL | Status: DC
  Administered 2014-04-06 – 2014-04-13 (×8): 325 mg via ORAL
  Filled 2014-04-05 (×9): qty 1

## 2014-04-05 MED ORDER — LIDOCAINE HCL (CARDIAC) 20 MG/ML IV SOLN
INTRAVENOUS | Status: AC
  Filled 2014-04-05: qty 5

## 2014-04-05 MED ORDER — DEXTROSE 5 % IV SOLN: 2.0000 g | Freq: Two times a day (BID) | INTRAVENOUS | Status: DC

## 2014-04-05 MED ORDER — CHLORHEXIDINE GLUCONATE 0.12 % MT SOLN
15.0000 mL | Freq: Two times a day (BID) | OROMUCOSAL | Status: DC
  Administered 2014-04-05 – 2014-04-06 (×2): 15 mL via OROMUCOSAL
  Filled 2014-04-05 (×2): qty 15

## 2014-04-05 MED ORDER — ALBUTEROL (5 MG/ML) CONTINUOUS INHALATION SOLN
10.0000 mg/h | INHALATION_SOLUTION | RESPIRATORY_TRACT | Status: DC
  Administered 2014-04-05: 10 mg/h via RESPIRATORY_TRACT
  Filled 2014-04-05: qty 20

## 2014-04-05 MED ORDER — MIDAZOLAM HCL 2 MG/2ML IJ SOLN
INTRAMUSCULAR | Status: AC
  Administered 2014-04-05: 2 mg
  Filled 2014-04-05: qty 2

## 2014-04-05 MED ORDER — LEVOFLOXACIN IN D5W 750 MG/150ML IV SOLN
750.0000 mg | INTRAVENOUS | Status: DC
  Administered 2014-04-05 – 2014-04-06 (×2): 750 mg via INTRAVENOUS
  Filled 2014-04-05 (×3): qty 150

## 2014-04-05 MED ORDER — METOPROLOL TARTRATE 1 MG/ML IV SOLN
2.5000 mg | Freq: Four times a day (QID) | INTRAVENOUS | Status: DC
  Administered 2014-04-05 – 2014-04-09 (×14): 2.5 mg via INTRAVENOUS
  Filled 2014-04-05 (×24): qty 5

## 2014-04-05 MED ORDER — SODIUM CHLORIDE 0.9 % IV SOLN
INTRAVENOUS | Status: DC
  Administered 2014-04-05 – 2014-04-06 (×3): via INTRAVENOUS

## 2014-04-05 MED ORDER — MAGNESIUM SULFATE 40 MG/ML IJ SOLN
2.0000 g | Freq: Once | INTRAMUSCULAR | Status: AC
  Administered 2014-04-05: 2 g via INTRAVENOUS
  Filled 2014-04-05: qty 50

## 2014-04-05 MED ORDER — MIDAZOLAM HCL 2 MG/2ML IJ SOLN
2.0000 mg | INTRAMUSCULAR | Status: DC | PRN
  Administered 2014-04-05: 2 mg via INTRAVENOUS
  Filled 2014-04-05: qty 2

## 2014-04-05 MED ORDER — HEPARIN BOLUS VIA INFUSION
4000.0000 [IU] | Freq: Once | INTRAVENOUS | Status: AC
  Administered 2014-04-05: 4000 [IU] via INTRAVENOUS
  Filled 2014-04-05: qty 4000

## 2014-04-05 MED ORDER — IPRATROPIUM BROMIDE 0.02 % IN SOLN
RESPIRATORY_TRACT | Status: AC
  Filled 2014-04-05: qty 2.5

## 2014-04-05 MED ORDER — METHYLPREDNISOLONE SODIUM SUCC 125 MG IJ SOLR
80.0000 mg | Freq: Three times a day (TID) | INTRAMUSCULAR | Status: DC
  Administered 2014-04-05 – 2014-04-06 (×3): 80 mg via INTRAVENOUS
  Filled 2014-04-05: qty 1.28
  Filled 2014-04-05: qty 2
  Filled 2014-04-05 (×4): qty 1.28

## 2014-04-05 MED ORDER — FENTANYL CITRATE 0.05 MG/ML IJ SOLN: 50.0000 ug | INTRAMUSCULAR | Status: DC | PRN

## 2014-04-05 MED ORDER — PANTOPRAZOLE SODIUM 40 MG IV SOLR
40.0000 mg | INTRAVENOUS | Status: DC
  Administered 2014-04-05 – 2014-04-08 (×4): 40 mg via INTRAVENOUS
  Filled 2014-04-05 (×5): qty 40

## 2014-04-05 MED ORDER — ETOMIDATE 2 MG/ML IV SOLN
INTRAVENOUS | Status: AC
  Administered 2014-04-05: 20 mg
  Filled 2014-04-05: qty 20

## 2014-04-05 MED ORDER — IPRATROPIUM-ALBUTEROL 0.5-2.5 (3) MG/3ML IN SOLN
3.0000 mL | RESPIRATORY_TRACT | Status: DC
  Administered 2014-04-05 – 2014-04-06 (×7): 3 mL via RESPIRATORY_TRACT
  Filled 2014-04-05 (×7): qty 3

## 2014-04-05 MED ORDER — SODIUM CHLORIDE 0.9 % IV SOLN
0.0000 mg/h | INTRAVENOUS | Status: DC
  Administered 2014-04-05: 4 mg/h via INTRAVENOUS
  Administered 2014-04-05: 2 mg/h via INTRAVENOUS
  Administered 2014-04-06: 4 mg/h via INTRAVENOUS
  Filled 2014-04-05 (×3): qty 10

## 2014-04-05 MED ORDER — ALBUTEROL SULFATE (2.5 MG/3ML) 0.083% IN NEBU: 2.5000 mg | INHALATION_SOLUTION | RESPIRATORY_TRACT | Status: DC | PRN

## 2014-04-05 MED ORDER — ALBUTEROL (5 MG/ML) CONTINUOUS INHALATION SOLN
INHALATION_SOLUTION | RESPIRATORY_TRACT | Status: AC
  Filled 2014-04-05: qty 20

## 2014-04-05 MED ORDER — BIOTENE DRY MOUTH MT LIQD
15.0000 mL | Freq: Two times a day (BID) | OROMUCOSAL | Status: DC
  Administered 2014-04-05 – 2014-04-06 (×2): 15 mL via OROMUCOSAL

## 2014-04-05 MED ORDER — VANCOMYCIN HCL IN DEXTROSE 750-5 MG/150ML-% IV SOLN
750.0000 mg | Freq: Two times a day (BID) | INTRAVENOUS | Status: DC
  Administered 2014-04-05 – 2014-04-06 (×3): 750 mg via INTRAVENOUS
  Filled 2014-04-05 (×3): qty 150

## 2014-04-05 MED ORDER — SUCCINYLCHOLINE CHLORIDE 20 MG/ML IJ SOLN
INTRAMUSCULAR | Status: AC
  Filled 2014-04-05: qty 1

## 2014-04-05 MED ORDER — MIDAZOLAM BOLUS VIA INFUSION
1.0000 mg | INTRAVENOUS | Status: DC | PRN
  Filled 2014-04-05: qty 2

## 2014-04-05 MED ORDER — ROCURONIUM BROMIDE 50 MG/5ML IV SOLN
INTRAVENOUS | Status: AC
  Filled 2014-04-05: qty 2

## 2014-04-05 MED ORDER — HEPARIN SODIUM (PORCINE) 5000 UNIT/ML IJ SOLN: 5000.0000 [IU] | Freq: Three times a day (TID) | INTRAMUSCULAR | Status: DC

## 2014-04-05 MED ORDER — IPRATROPIUM BROMIDE 0.02 % IN SOLN
0.5000 mg | Freq: Once | RESPIRATORY_TRACT | Status: AC
  Administered 2014-04-05: 0.5 mg via RESPIRATORY_TRACT
  Filled 2014-04-05: qty 2.5

## 2014-04-05 MED ORDER — IPRATROPIUM-ALBUTEROL 0.5-2.5 (3) MG/3ML IN SOLN: 3.0000 mL | RESPIRATORY_TRACT | Status: DC | PRN

## 2014-04-05 MED ORDER — HEPARIN (PORCINE) IN NACL 100-0.45 UNIT/ML-% IJ SOLN
1200.0000 [IU]/h | INTRAMUSCULAR | Status: DC
  Administered 2014-04-05 – 2014-04-06 (×2): 1100 [IU]/h via INTRAVENOUS
  Administered 2014-04-08: 1200 [IU]/h via INTRAVENOUS
  Filled 2014-04-05 (×7): qty 250

## 2014-04-05 MED ORDER — SODIUM CHLORIDE 0.9 % IV SOLN
25.0000 ug/h | INTRAVENOUS | Status: DC
  Administered 2014-04-05: 25 ug/h via INTRAVENOUS
  Administered 2014-04-06: 125 ug/h via INTRAVENOUS
  Administered 2014-04-06: 50 ug/h via INTRAVENOUS
  Filled 2014-04-05 (×2): qty 50

## 2014-04-05 MED ORDER — SODIUM CHLORIDE 0.9 % IV BOLUS (SEPSIS)
1000.0000 mL | Freq: Once | INTRAVENOUS | Status: AC
  Administered 2014-04-05: 1000 mL via INTRAVENOUS

## 2014-04-05 MED ORDER — PIPERACILLIN-TAZOBACTAM 3.375 G IVPB 30 MIN
3.3750 g | Freq: Once | INTRAVENOUS | Status: AC
  Administered 2014-04-05: 3.375 g via INTRAVENOUS
  Filled 2014-04-05: qty 50

## 2014-04-05 MED ORDER — PIPERACILLIN-TAZOBACTAM 3.375 G IVPB
3.3750 g | Freq: Three times a day (TID) | INTRAVENOUS | Status: DC
  Administered 2014-04-05 – 2014-04-07 (×5): 3.375 g via INTRAVENOUS
  Filled 2014-04-05 (×7): qty 50

## 2014-04-05 MED ORDER — INSULIN ASPART 100 UNIT/ML ~~LOC~~ SOLN
0.0000 [IU] | SUBCUTANEOUS | Status: DC
  Administered 2014-04-05: 15 [IU] via SUBCUTANEOUS
  Administered 2014-04-05: 11 [IU] via SUBCUTANEOUS
  Administered 2014-04-06: 5 [IU] via SUBCUTANEOUS
  Administered 2014-04-06: 8 [IU] via SUBCUTANEOUS
  Administered 2014-04-06 (×2): 3 [IU] via SUBCUTANEOUS
  Administered 2014-04-06: 8 [IU] via SUBCUTANEOUS
  Administered 2014-04-07: 3 [IU] via SUBCUTANEOUS
  Administered 2014-04-07: 2 [IU] via SUBCUTANEOUS
  Administered 2014-04-07 (×2): 5 [IU] via SUBCUTANEOUS
  Administered 2014-04-08: 3 [IU] via SUBCUTANEOUS
  Administered 2014-04-08 (×2): 2 [IU] via SUBCUTANEOUS

## 2014-04-05 NOTE — H&P (Addendum)
PULMONARY / CRITICAL CARE MEDICINE  Name: [name] MRN: [id number] DOB: 1941/07/16    ADMISSION DATE:  04/05/2014 CONSULTATION DATE:  04/05/2014  REFERRING MD :  EDP PRIMARY SERVICE:  PCCM  CHIEF COMPLAINT:  Acute respiratory failure  BRIEF PATIENT DESCRIPTION: 73 yo with COPD / reactive airway disease brought in [facility] ED in respiratory distress on 5/9, intubated.  SIGNIFICANT EVENTS / STUDIES:   LINES / TUBES: OETT 5/9 >>> OGT 5/9 >>> Foley 5/9 >>>  CULTURES: 5/9 Blood >>> 5/9 Respiratory >>>  ANTIBIOTICS: Zosyn 5/9 >>> Vancomycin 5/9 >>> Levaquin 5/9 >>>  The patient is encephalopathic and unable to provide history, which was obtained for available medical records.  HISTORY OF PRESENT ILLNESS:  73 yo with COPD / reactive airway disease brought in [facility] ED in respiratory distress on 5/9, intubated.  PAST MEDICAL HISTORY :  Past Medical History  Diagnosis Date  . COPD (chronic obstructive pulmonary disease)   . Hypertension   . Asthma   . Psoriatic arthritis   . Psoriasis   . GERD (gastroesophageal reflux disease)   . Hiatal hernia   . Chronic sinus infection   . Heart murmur   . Blood transfusion ~ 1962    "11 units of blood"  . Ankylosing spondylitis   . Chronic back pain   . Dysrhythmia     "palpitations"  . Esophageal dilatation   . Candida esophagitis   . Cirrhosis of liver without mention of alcohol   . Hypercholesteremia   . Peripheral vascular disease   . Exertional dyspnea   . Pneumonia 10/07/11; 2006; 1980's  . Chronic bronchitis     "since childhood" (12/06/2012)  . Type II diabetes mellitus   . Iron deficiency anemia   . External bleeding hemorrhoids   . Sinus headache   . Arthritis     "psorasic"  . OA (osteoarthritis)   . DJD (degenerative joint disease) of knee     "both" (12/06/2012)  . Carpal tunnel syndrome on right   . Anxiety   . Barrett esophagus   . Non-healing ulcer of foot, secondary to diabetes and PVD 12/07/2012  . PVD  (peripheral vascular disease) 12/07/2012  . Tobacco abuse 12/07/2012  . Critical lower limb ischemia, with ulcer Lt foot 12/07/2012  . Gout   . COPD (chronic obstructive pulmonary disease)   . Carotid artery disease     dopplers showed mod L ICA stenosis   . PAD (peripheral artery disease)   . History of nuclear stress test 11/2012    lexiscan; normal, low risk   . Dermatophytosis of nail [id number]  . Psoriasis   . GI bleed   . Diverticulosis    Past Surgical History  Procedure Laterality Date  . Nasal sinus surgery  09/29/2009  . Carpal tunnel release  2011    left hand  . Lipoma excision  2010    "left occipital area"  . Cataract extraction w/ intraocular lens implant  2009/2010    right/left  . Eye surgery    . Oophorectomy      'not sure which one"  . Appendectomy    . Angioplasty  12/10/2012    "LLE"; LSFA [place] orbital rotational arthrectomy, PTA , stenting with IDEV stent (left ABI improved from 0.35 to 0.75)  . Abdominal hysterectomy  1976  . Breast biopsy  1960's    "think he did both" (12/06/2012)  . 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 Esophagogastroduodenoscopy  2011  . Colonoscopy  2007    Hemorrhoids and Diverticulosis  . Esophagogastroduodenoscopy N/A 03/17/2014    Procedure: ESOPHAGOGASTRODUODENOSCOPY (EGD);  Surgeon: Irene Shipper, MD;  Location: [facility] ENDOSCOPY;  Service: Endoscopy;  Laterality: N/A;   Prior to Admission medications   Medication Sig Start Date End Date Taking? Authorizing Provider  acetaminophen (TYLENOL) 325 MG tablet Take 2 tablets (650 mg total) by mouth every 6 (six) hours as needed for mild pain (or Fever >/= 101). 12/04/13   Robbie Lis, MD  albuterol (PROVENTIL HFA;VENTOLIN HFA) 108 (90 BASE) MCG/ACT inhaler Inhale 1 puff into the lungs every 6 (six) hours as needed. FOR SHORTNESS OF BREATH     Historical Provider, MD  ALPRAZolam Duanne Moron) 0.5 MG tablet Take 1 tablet (0.5 mg total) by mouth 2 (two) times daily as needed for anxiety or sleep. 12/04/13   Robbie Lis, MD  ammonium lactate (LAC-HYDRIN) 12 % lotion Apply 1 application topically as needed for dry skin (psorias).    Historical Provider, MD  Brinzolamide-Brimonidine [facility]) 1-0.2 % SUSP Place 1 drop into the right eye 3 (three) times daily.     Historical Provider, MD  Clobetasol Propionate (TEMOVATE) 0.05 % external spray Apply 1 spray topically 2 (two) times daily as needed. For psoriasis flare-ups    Historical Provider, MD  esomeprazole (NEXIUM) 40 MG capsule Take 40 mg by mouth daily before breakfast.    Historical Provider, MD  feeding supplement, GLUCERNA SHAKE, (GLUCERNA SHAKE) LIQD Take 237 mLs by mouth 2 (two) times daily between meals. 12/04/13   Robbie Lis, MD  ferrous sulfate 325 (65 FE) MG tablet Take 1 tablet (325 mg total) by mouth 2 (two) times daily with a meal. 03/21/14   Bobby Rumpf York, PA-C  fluticasone (FLONASE) 50 MCG/ACT nasal spray Place 2 sprays into both nostrils daily as needed for allergies.     Historical Provider, MD  furosemide (LASIX) 40 MG tablet Take 20 mg by mouth daily.  03/04/14   Historical Provider, MD  hydrALAZINE (APRESOLINE) 10 MG tablet Take 1 tablet (10 mg total) by mouth every 8 (eight) hours. 12/04/13   Robbie Lis, MD  ipratropium-albuterol (DUONEB) 0.5-2.5 (3) MG/3ML SOLN Take 3 mLs by nebulization every 4 (four) hours as needed (shortness of breath).    Historical Provider, MD  linagliptin (TRADJENTA) 5 MG TABS tablet Take 1 tablet (5 mg total) by mouth daily. 03/21/14   Bobby Rumpf York, PA-C  metFORMIN (GLUCOPHAGE) 500 MG tablet Take 500 mg by mouth 2 (two) times daily with a meal.    Historical Provider, MD  MOVIPREP 100 G SOLR Take 1 kit (200 g total) by mouth once. 04/02/14   Irene Shipper, MD  Multiple Vitamins-Minerals (EYE VITAMINS) CAPS Take 1 capsule by mouth daily.     Historical Provider, MD  ondansetron (ZOFRAN) 4 MG tablet Take 1 tablet (4 mg total) by mouth every 8 (eight) hours as needed for nausea or vomiting. 03/21/14   Melton Alar, PA-C  ONE TOUCH ULTRA TEST test strip  03/10/14   Historical Provider, MD  predniSONE (DELTASONE) 5 MG tablet Take 5 mg by mouth daily. On Monday, Wednesday, Friday     Historical Provider, MD  ranitidine (ZANTAC) 150 MG capsule Take 150 mg by mouth at bedtime.      Historical Provider, MD  RESTASIS 0.05 % ophthalmic emulsion Place  1 drop into both eyes 2 (two) times daily.  03/07/14   Historical Provider, MD  senna (SENOKOT) 8.6 MG TABS tablet Take 1 tablet (8.6 mg total) by mouth 2 (two) times daily. 12/04/13   Alison Murray, MD  simethicone (MYLICON) 80 MG chewable tablet Chew 1 tablet (80 mg total) by mouth 4 (four) times daily as needed for flatulence. 12/04/13   Alison Murray, MD  SYMBICORT 160-4.5 MCG/ACT inhaler Inhale 2 puffs into the lungs every evening.  02/27/14   Historical Provider, MD  tiotropium (SPIRIVA) 18 MCG inhalation capsule Place 1 capsule (18 mcg total) into inhaler and inhale daily. 12/04/13   Alison Murray, MD  Travoprost, BAK Free, (TRAVATAMN) 0.004 % SOLN ophthalmic solution Place 1 drop into both eyes at bedtime.     Historical Provider, MD   Allergies  Allergen Reactions  . Amlodipine Besy-Benazepril Hcl Other (See Comments)    Nervous/shakiness  . Statins Other (See Comments)    "can't take any of them; cramp me up" (12/06/2012)  . Plavix [Clopidogrel] Rash    Pt states that the doctor told her that her rash was psoriasis and not related to plavix but she states the rash came from the plavix  . Raptiva [Efalizumab] Rash   FAMILY HISTORY:  Family History  Problem Relation Age of Onset  . Lupus Sister     x2  . Lung cancer Sister   . Thyroid disease Sister   . Colon cancer Neg Hx   . Cancer Mother   . Heart Problems Maternal Grandmother   . Cancer Paternal Grandfather   . Diabetes Brother      x2  . Arthritis Brother     x3  . Arthritis Sister     x3   SOCIAL HISTORY:  reports that she has been smoking Cigarettes.  She has a 40 pack-year smoking history. She has never used smokeless tobacco. She reports that she does not drink alcohol or use illicit drugs.  REVIEW OF SYSTEMS:  Unable to provide.  INTERVAL HISTORY:  VITAL SIGNS: Temp:  [97.9 F (36.6 C)-98.8 F (37.1 C)] 98.8 F (37.1 C) (05/09 0845) Pulse Rate:  [70-108] 93 (05/09 0845) Resp:  [18-33] 20 (05/09 0845) BP: (106-120)/(57-69) 116/57 mmHg (05/09 0845) SpO2:  [55 %-100 %] 100 % (05/09 0845) FiO2 (%):  [100 %] 100 % (05/09 0930)  HEMODYNAMICS:   VENTILATOR SETTINGS: Vent Mode:  [-] PRVC FiO2 (%):  [100 %] 100 % Set Rate:  [14 bmp-20 bmp] 20 bmp Vt Set:  [450 mL] 450 mL PEEP:  [5 cmH20] 5 cmH20 Plateau Pressure:  [25 cmH20] 25 cmH20 INTAKE / OUTPUT: Intake/Output   None     PHYSICAL EXAMINATION: General:  Appears acutely ill, mechanically ventilated, synchronous Neuro:  Encephalopathic, nonfocal, cough / gag diminished HEENT:  PERRL, OETT Cardiovascular:  RRR, no m/r/g Lungs:  Bilateral diminished air entry, rhonchi, wheezes Abdomen:  Soft, nontender, bowel sounds diminished Musculoskeletal:  Moves all extremities, no edema Skin:  Intact  LABS: CBC  Recent Labs Lab 04/02/14 1025 04/05/14 0727  WBC 10.2 14.8*  HGB 8.3 Repeated and verified X2.* 8.1*  HCT 27.9* 29.5*  PLT 271.0 323   Coag's No results found for this basename: APTT, INR,  in the last 168 hours  BMET  Recent Labs Lab 04/05/14 0727  NA 141  K 4.5  CL 102  CO2 21  BUN 15  CREATININE 1.16*  GLUCOSE 341*   Electrolytes  Recent Labs  Lab 04/05/14 0727  CALCIUM 8.4   Sepsis Markers  Recent Labs Lab 04/05/14 0811  LATICACIDVEN 6.57*   ABG  Recent Labs Lab 04/05/14 0843  PHART 7.125*  PCO2ART 72.2*  PO2ART 87.0   Liver Enzymes  Recent Labs Lab 04/05/14 0727  AST 82*  ALT 52*  ALKPHOS 93   BILITOT 0.3  ALBUMIN 2.9*   Cardiac Enzymes  Recent Labs Lab 04/05/14 0742  TROPONINI <0.30  PROBNP 9378.0*   Glucose No results found for this basename: GLUCAP,  in the last 168 hours  IMAGING: Dg Chest Port 1 View  04/05/2014   CLINICAL DATA:  Respiratory distress, endotracheal tube placed  EXAM: PORTABLE CHEST - 1 VIEW  COMPARISON:  DG CHEST 1V PORT dated 03/16/2014; DG CHEST 1V PORT dated 12/01/2013; DG CHEST 1V PORT dated 11/29/2013  FINDINGS: Endotracheal tube tip is 1 cm above the carinal. There is moderate cardiac enlargement. There is vascular congestion. There is moderate interstitial prominence suggesting pulmonary edema, a change from prior study.  IMPRESSION: Pulmonary edema.  Endotracheal tube tip 1 cm above carina.   Electronically Signed   By: Skipper Cliche M.D.   On: 04/05/2014 08:07   ASSESSMENT / PLAN:  PULMONARY A:   Acute respiratory failure AECOPD Possible pneumonia Less likely pulmonary edema P:   Goal SpO2>92, pH>7.30 Full mechanical support Daily SBT Ventilator bundle Trend ABG / CXR Albuterol / Atrovent Solu-Medrol Hold Symbicort / Spiriva  ETT out 2 cm  CARDIOVASCULAR A:  Hemodynamically stable No arrythmia / ischemia Elevated lactate in setting of hypovolemia Chronic systolic / diastolic heart failure H/o HTN P:  Goal MAP>60 Trend troponin / lactate Hold Hydralazine  RENAL A:   Clinically hypovolemic AKI P:   Trend BMP NS 1000 x 1 NS_0  Hold Lasix TTE  GASTROINTESTINAL A:   Nutrition GERD P:   NPO as intubated TF if remains intubated > 24 hours Protonix as preadmission  HEMATOLOGIC A:   Anemia No overt hemorrhage VTE Px P:  Trend CBC Heparin for DVT Px  INFECTIOUS A:   Possible pneumonia P:   Cultures and antibiotics as above PCT  ENDOCRINE  A:   DM Possible adrenal insufficiency ( on chronic low dose Prednisone )  P:   SSI Already on Solu-Medrol Hold Tradjenta, Metformin  NEUROLOGIC A:   Acute  encephalopathy P:   Goal RASS 0 to -1 Daily WUA Fentanyl / Versed gtt  I have personally obtained history, examined patient, evaluated and interpreted laboratory and imaging results, reviewed medical records, formulated assessment / plan and placed orders.  CRITICAL CARE:  The patient is critically ill with multiple organ systems failure and requires high complexity decision making for assessment and support, frequent evaluation and titration of therapies, application of advanced monitoring technologies and extensive interpretation of multiple databases. Critical Care Time devoted to patient care services described in this note is 45 minutes.   Doree Fudge, MD Pulmonary and [place] Pager: [phone]  04/05/2014, 9:37 AM

## 2014-04-05 NOTE — ED Notes (Addendum)
Pt to ED via [facility], from home, for eval of severe SOB. Pt unable to speak. Initial sats per ems was 60%. Neb tx of 10mg  Alb and solumedrol and epi given enroute. CBG 360 PTZ

## 2014-04-05 NOTE — ED Notes (Signed)
Attempted to place OG tube , unable to place at this time due to patient thrashing about in bed

## 2014-04-05 NOTE — ED Notes (Signed)
Lab results reported to Dr.Allen. 

## 2014-04-05 NOTE — ED Notes (Signed)
Notified CCM of troponin of 7.05

## 2014-04-05 NOTE — ED Provider Notes (Signed)
CSN: [id number]     Arrival date & time 04/05/14  0722 History   None    Chief Complaint  Patient presents with  . Shortness of Breath  . Asthma     (Consider location/radiation/quality/duration/timing/severity/associated sxs/prior Treatment) Patient is a 73 y.o. female presenting with shortness of breath. The history is provided by the EMS personnel. The history is limited by the condition of the patient. No language interpreter was used.  Shortness of Breath Severity:  Severe Onset quality:  Unable to specify Timing:  Unable to specify Progression:  Worsening Context comment:  Unknown Relieved by: unknown. Exacerbated by: unknown. Ineffective treatments:  Inhaler Associated symptoms comment:  Unknown  Risk factors: tobacco use   Risk factors comment:  COPD   Past Medical History  Diagnosis Date  . COPD (chronic obstructive pulmonary disease)   . Hypertension   . Asthma   . Psoriatic arthritis   . Psoriasis   . GERD (gastroesophageal reflux disease)   . Hiatal hernia   . Chronic sinus infection   . Heart murmur   . Blood transfusion ~ 1962    "11 units of blood"  . Ankylosing spondylitis   . Chronic back pain   . Dysrhythmia     "palpitations"  . Esophageal dilatation   . Candida esophagitis   . Cirrhosis of liver without mention of alcohol   . Hypercholesteremia   . Peripheral vascular disease   . Exertional dyspnea   . Pneumonia 10/07/11; 2006; 1980's  . Chronic bronchitis     "since childhood" (12/06/2012)  . Type II diabetes mellitus   . Iron deficiency anemia   . External bleeding hemorrhoids   . Sinus headache   . Arthritis     "psorasic"  . OA (osteoarthritis)   . DJD (degenerative joint disease) of knee     "both" (12/06/2012)  . Carpal tunnel syndrome on right   . Anxiety   . Barrett esophagus   . Non-healing ulcer of foot, secondary to diabetes and PVD 12/07/2012  . PVD (peripheral vascular disease) 12/07/2012  . Tobacco abuse 12/07/2012  .  Critical lower limb ischemia, with ulcer Lt foot 12/07/2012  . Gout   . COPD (chronic obstructive pulmonary disease)   . Carotid artery disease     dopplers showed mod L ICA stenosis   . PAD (peripheral artery disease)   . History of nuclear stress test 11/2012    lexiscan; normal, low risk   . Dermatophytosis of nail [id number]  . Psoriasis   . GI bleed   . Diverticulosis    Past Surgical History  Procedure Laterality Date  . Nasal sinus surgery  09/29/2009  . Carpal tunnel release  2011    left hand  . Lipoma excision  2010    "left occipital area"  . Cataract extraction w/ intraocular lens implant  2009/2010    right/left  . Eye surgery    . Oophorectomy      'not sure which one"  . Appendectomy    . Angioplasty  12/10/2012    "LLE"; LSFA [place] orbital rotational arthrectomy, PTA , stenting with IDEV stent (left ABI improved from 0.35 to 0.75)  . Abdominal hysterectomy  1976  . Breast biopsy  1960's    "think he did both" (12/06/2012)  . 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 Esophagogastroduodenoscopy  2011  .  Colonoscopy  2007    Hemorrhoids and Diverticulosis  . Esophagogastroduodenoscopy N/A 03/17/2014    Procedure: ESOPHAGOGASTRODUODENOSCOPY (EGD);  Surgeon: Irene Shipper, MD;  Location: [facility] ENDOSCOPY;  Service: Endoscopy;  Laterality: N/A;   Family History  Problem Relation Age of Onset  . Lupus Sister     x2  . Lung cancer Sister   . Thyroid disease Sister   . Colon cancer Neg Hx   . Cancer Mother   . Heart Problems Maternal Grandmother   . Cancer Paternal Grandfather   . Diabetes Brother     x2  . Arthritis Brother     x3  . Arthritis Sister     x3   History  Substance Use Topics  . Smoking status: Current Some Day Smoker -- 1.00 packs/day for 40 years    Types: Cigarettes  . Smokeless tobacco: Never Used      Comment: pt states that she smokes "every once in a while"  . Alcohol Use: No     Comment: "quit social drinking years ago"   OB History   Grav Para Term Preterm Abortions TAB SAB Ect Mult Living                 Review of Systems  Unable to perform ROS Respiratory: Positive for shortness of breath.       Allergies  Amlodipine besy-benazepril hcl; Statins; Plavix; and Raptiva  Home Medications   Prior to Admission medications   Medication Sig Start Date End Date Taking? Authorizing Provider  acetaminophen (TYLENOL) 325 MG tablet Take 2 tablets (650 mg total) by mouth every 6 (six) hours as needed for mild pain (or Fever >/= 101). 12/04/13   Robbie Lis, MD  albuterol (PROVENTIL HFA;VENTOLIN HFA) 108 (90 BASE) MCG/ACT inhaler Inhale 1 puff into the lungs every 6 (six) hours as needed. FOR SHORTNESS OF BREATH    Historical Provider, MD  ALPRAZolam Duanne Moron) 0.5 MG tablet Take 1 tablet (0.5 mg total) by mouth 2 (two) times daily as needed for anxiety or sleep. 12/04/13   Robbie Lis, MD  ammonium lactate (LAC-HYDRIN) 12 % lotion Apply 1 application topically as needed for dry skin (psorias).    Historical Provider, MD  Brinzolamide-Brimonidine [facility]) 1-0.2 % SUSP Place 1 drop into the right eye 3 (three) times daily.     Historical Provider, MD  Clobetasol Propionate (TEMOVATE) 0.05 % external spray Apply 1 spray topically 2 (two) times daily as needed. For psoriasis flare-ups    Historical Provider, MD  esomeprazole (NEXIUM) 40 MG capsule Take 40 mg by mouth daily before breakfast.    Historical Provider, MD  feeding supplement, GLUCERNA SHAKE, (GLUCERNA SHAKE) LIQD Take 237 mLs by mouth 2 (two) times daily between meals. 12/04/13   Robbie Lis, MD  ferrous sulfate 325 (65 FE) MG tablet Take 1 tablet (325 mg total) by mouth 2 (two) times daily with a meal. 03/21/14   Bobby Rumpf York, PA-C  fluticasone (FLONASE) 50 MCG/ACT nasal spray Place 2 sprays into both nostrils daily as needed  for allergies.     Historical Provider, MD  furosemide (LASIX) 40 MG tablet Take 20 mg by mouth daily.  03/04/14   Historical Provider, MD  hydrALAZINE (APRESOLINE) 10 MG tablet Take 1 tablet (10 mg total) by mouth every 8 (eight) hours. 12/04/13   Robbie Lis, MD  ipratropium-albuterol (DUONEB) 0.5-2.5 (3) MG/3ML SOLN Take 3 mLs by nebulization every 4 (four) hours as needed (shortness of  breath).    Historical Provider, MD  linagliptin (TRADJENTA) 5 MG TABS tablet Take 1 tablet (5 mg total) by mouth daily. 03/21/14   Bobby Rumpf York, PA-C  metFORMIN (GLUCOPHAGE) 500 MG tablet Take 500 mg by mouth 2 (two) times daily with a meal.    Historical Provider, MD  MOVIPREP 100 G SOLR Take 1 kit (200 g total) by mouth once. 04/02/14   Irene Shipper, MD  Multiple Vitamins-Minerals (EYE VITAMINS) CAPS Take 1 capsule by mouth daily.    Historical Provider, MD  ondansetron (ZOFRAN) 4 MG tablet Take 1 tablet (4 mg total) by mouth every 8 (eight) hours as needed for nausea or vomiting. 03/21/14   Melton Alar, PA-C  ONE TOUCH ULTRA TEST test strip  03/10/14   Historical Provider, MD  predniSONE (DELTASONE) 5 MG tablet Take 5 mg by mouth daily. On Monday, Wednesday, Friday     Historical Provider, MD  ranitidine (ZANTAC) 150 MG capsule Take 150 mg by mouth at bedtime.      Historical Provider, MD  RESTASIS 0.05 % ophthalmic emulsion Place 1 drop into both eyes 2 (two) times daily.  03/07/14   Historical Provider, MD  senna (SENOKOT) 8.6 MG TABS tablet Take 1 tablet (8.6 mg total) by mouth 2 (two) times daily. 12/04/13   Robbie Lis, MD  simethicone (MYLICON) 80 MG chewable tablet Chew 1 tablet (80 mg total) by mouth 4 (four) times daily as needed for flatulence. 12/04/13   Robbie Lis, MD  SYMBICORT 160-4.5 MCG/ACT inhaler Inhale 2 puffs into the lungs every evening.  02/27/14   Historical Provider, MD  tiotropium (SPIRIVA) 18 MCG inhalation capsule Place 1 capsule (18 mcg total) into inhaler and inhale daily. 12/04/13    Robbie Lis, MD  Travoprost, BAK Free, (TRAVATAMN) 0.004 % SOLN ophthalmic solution Place 1 drop into both eyes at bedtime.     Historical Provider, MD   BP 116/57  Pulse 93  Temp(Src) 98.8 F (37.1 C) (Core (Comment))  Resp 20  SpO2 100% Physical Exam  Constitutional: She is oriented to person, place, and time. She appears lethargic. She appears ill. She appears distressed.  HENT:  Head: Normocephalic and atraumatic.  Mouth/Throat: No oropharyngeal exudate.  Eyes: Pupils are equal, round, and reactive to light.  Neck: Normal range of motion. Neck supple.  Cardiovascular: Regular rhythm and normal heart sounds.  Tachycardia present.  Exam reveals decreased pulses. Exam reveals no gallop and no friction rub.   No murmur heard. Pulmonary/Chest: Accessory muscle usage present. Tachypnea noted. She is in respiratory distress. She has decreased breath sounds in the right upper field, the right lower field, the left upper field, the left middle field and the left lower field. She has wheezes in the right upper field and the left upper field.  Abdominal: Soft. Bowel sounds are normal. She exhibits no distension and no mass. There is no tenderness. There is no rebound and no guarding.  Musculoskeletal: Normal range of motion. She exhibits no edema and no tenderness.  Neurological: She is oriented to person, place, and time. She appears lethargic.  Skin: Skin is warm and dry.  Psychiatric: She has a normal mood and affect.    ED Course  INTUBATION Date/Time: 04/05/2014 9:03 AM Performed by: Ernestina Patches, E Authorized by: Ernestina Patches, E Consent: Verbal consent not obtained. written consent not obtained. The procedure was performed in an emergent situation. Indications: respiratory failure,  airway protection and  hypoxemia Intubation  method: video-assisted Patient status: sedated Preoxygenation: BiPAP. Sedatives: etomidate Laryngoscope size: Miller 3 Tube size: 7.5 mm Tube type:  cuffed Number of attempts: 1 Cricoid pressure: no Cords visualized: yes Post-procedure assessment: chest rise and CO2 detector Breath sounds: equal Cuff inflated: yes ETT to lip: 21 cm ETT to teeth: 21 cm Chest x-ray interpreted by me. Chest x-ray findings: endotracheal tube in appropriate position Patient tolerance: Patient tolerated the procedure well with no immediate complications.   (including critical care time) Labs Review Labs Reviewed  COMPREHENSIVE METABOLIC PANEL - Abnormal; Notable for the following:    Glucose, Bld 341 (*)    Creatinine, Ser 1.16 (*)    Albumin 2.9 (*)    AST 82 (*)    ALT 52 (*)    GFR calc non Af Amer 45 (*)    GFR calc Af Amer 52 (*)    All other components within normal limits  PRO B NATRIURETIC PEPTIDE - Abnormal; Notable for the following:    Pro B Natriuretic peptide (BNP) 9378.0 (*)    All other components within normal limits  I-STAT CG4 LACTIC ACID, ED - Abnormal; Notable for the following:    Lactic Acid, Venous 6.57 (*)    All other components within normal limits  I-STAT ARTERIAL BLOOD GAS, ED - Abnormal; Notable for the following:    pH, Arterial 7.125 (*)    pCO2 arterial 72.2 (*)    Acid-base deficit 6.0 (*)    All other components within normal limits  TROPONIN I  CBC WITH DIFFERENTIAL  BLOOD GAS, ARTERIAL  URINALYSIS, ROUTINE W REFLEX MICROSCOPIC  TROPONIN I  Randolm Idol, ED    Imaging Review Dg Chest Port 1 View  04/05/2014   CLINICAL DATA:  Respiratory distress, endotracheal tube placed  EXAM: PORTABLE CHEST - 1 VIEW  COMPARISON:  DG CHEST 1V PORT dated 03/16/2014; DG CHEST 1V PORT dated 12/01/2013; DG CHEST 1V PORT dated 11/29/2013  FINDINGS: Endotracheal tube tip is 1 cm above the carinal. There is moderate cardiac enlargement. There is vascular congestion. There is moderate interstitial prominence suggesting pulmonary edema, a change from prior study.  IMPRESSION: Pulmonary edema.  Endotracheal tube tip 1 cm above  carina.   Electronically Signed   By: Skipper Cliche M.D.   On: 04/05/2014 08:07     EKG Interpretation   Date/Time:  Saturday Apr 05 2014 08:16:04 EDT Ventricular Rate:  99 PR Interval:  144 QRS Duration: 79 QT Interval:  385 QTC Calculation: 494 R Axis:   17 Text Interpretation:  Age not entered, assumed to be  73 years old for  purpose of ECG interpretation Sinus rhythm Borderline low voltage,  extremity leads Nonspecific T abnormalities, lateral leads ST elevation,  consider anterior injury V2, V3, V4 Unchanged TWI I, aVL Borderline  prolonged QT interval Confirmed by Dawson ([id number]) on 04/05/2014  8:22:07 AM      CRITICAL CARE Performed by: Neta Ehlers Total critical care time: 35 Critical care time was exclusive of separately billable procedures and treating other patients. Critical care was necessary to treat or prevent imminent or life-threatening deterioration. Critical care was time spent personally by me on the following activities: development of treatment plan with patient and/or surrogate as well as nursing, discussions with consultants, evaluation of patient's response to treatment, examination of patient, obtaining history from patient or surrogate, ordering and performing treatments and interventions, ordering and review of laboratory studies, ordering and review of radiographic studies, pulse oximetry  and re-evaluation of patient's condition.  MDM   Final diagnoses:  Respiratory failure requiring intubation  COPD (chronic obstructive pulmonary disease)  CHF exacerbation    Pt is a 73 y.o. female with Pmhx as above including COPD who presents with respiratory distress from home. Pt initially 60's on RA for EMS, was given 63m albuterol 0.5 ipratropium, and 1235mIV solumedrol, as well as SQ epi.  She did not tolerate CPAP per EMS.  Upon arrival, pt in respiratory distress, has dec metal status, unable to speak or follow commands, had very little  air movement, was not protecting airway and was not felt to be good candidate for long term BiPAP due to mental status. O2 sats here dropped as low as 40's until pt pre-oxygenated w/ BiPAP and intubated with improvement of O2 saturations. CXR showed pulm, edema, BNP elevated. EKG w/ minor STE V2-4 but no new reciprocal changes, and no trop elevation. ABG w/ significant resp acidosis. Vent RR inc.  CCM consulted for admission for presumed COPD/CHF exacerbation w/ resp failure.         MeNeta EhlersMD 04/05/14 [id number]

## 2014-04-05 NOTE — Progress Notes (Signed)
ANTICOAGULATION CONSULT NOTE - Follow Up Consult  Pharmacy Consult for heparin Indication: chest pain/ACS  Labs:  Recent Labs  04/05/14 0727  04/05/14 1135 04/05/14 1532 04/05/14 2210  HGB 8.1*  --   --   --   --   HCT 29.5*  --   --   --   --   PLT 323  --   --   --   --   HEPARINUNFRC  --   --   --   --  0.50  CREATININE 1.16*  --   --   --   --   TROPONINI  --   < > 7.05* >20.00* >20.00*  < > = values in this interval not displayed.   Assessment/Plan:  73yo female therapeutic on heparin with initial dosing for ACS. Will continue gtt at current rate and confirm stable with am labs.   Wynona Neat, PharmD, BCPS  04/05/2014,11:34 PM

## 2014-04-05 NOTE — ED Notes (Signed)
CCM at bedside 

## 2014-04-05 NOTE — Consult Note (Signed)
CARDIOLOGY CONSULT NOTE     Primary Care Physician: Leola Brazil, MD Referring Physician:  Admit Date: 04/05/2014  Reason for consultation: Elevated troponins, and ST segment elevation in V2, V3 and V4.  [name] is a 73 y.o. female with a h/o hypertension, combined chronic CHF (echocardiogram 03/09/2014 EF 35% with diffuse hypokinesis and grade 1 diastolic dysfunction), COPD, diabetes, with A1c 7.2% on 01/03/2013, peripheral arterial disease, pulmonary hypertension, GI bleed, esophageal candidiasis and rheumatoid arthritis on chronic steroids was admitted to the critical care service with acute respiratory failure. Patient was emergently intubated and is currently on mechanical ventilation. Her primary cardiologist is Dr. Quay Burow of [facility]. Chart review does not reveal recent cardiac catheterization. However, she had a normal Myoview scan on 12/05/2012. Troponins initially negative but elevated to 7.05. Recently, she had a positive FOBT on 03/16/2014 and was in the process of GI workup. Hemoglobin level is 8.1. Chest x-ray with evidence of pulmonary edema.   Further history is unobtainable due to patient's acute clinical status.  Past Medical History  Diagnosis Date  . COPD (chronic obstructive pulmonary disease)   . Hypertension   . Asthma   . Psoriatic arthritis   . Psoriasis   . GERD (gastroesophageal reflux disease)   . Hiatal hernia   . Chronic sinus infection   . Heart murmur   . Blood transfusion ~ 1962    "11 units of blood"  . Ankylosing spondylitis   . Chronic back pain   . Dysrhythmia     "palpitations"  . Esophageal dilatation   . Candida esophagitis   . Cirrhosis of liver without mention of alcohol   . Hypercholesteremia   . Peripheral vascular disease   . Exertional dyspnea   . Pneumonia 10/07/11; 2006; 1980's  . Chronic bronchitis     "since childhood" (12/06/2012)  . Type II diabetes mellitus   . Iron deficiency anemia     . External bleeding hemorrhoids   . Sinus headache   . Arthritis     "psorasic"  . OA (osteoarthritis)   . DJD (degenerative joint disease) of knee     "both" (12/06/2012)  . Carpal tunnel syndrome on right   . Anxiety   . Barrett esophagus   . Non-healing ulcer of foot, secondary to diabetes and PVD 12/07/2012  . PVD (peripheral vascular disease) 12/07/2012  . Tobacco abuse 12/07/2012  . Critical lower limb ischemia, with ulcer Lt foot 12/07/2012  . Gout   . COPD (chronic obstructive pulmonary disease)   . Carotid artery disease     dopplers showed mod L ICA stenosis   . PAD (peripheral artery disease)   . History of nuclear stress test 11/2012    lexiscan; normal, low risk   . Dermatophytosis of nail [id number]  . Psoriasis   . GI bleed   . Diverticulosis    Past Surgical History  Procedure Laterality Date  . Nasal sinus surgery  09/29/2009  . Carpal tunnel release  2011    left hand  . Lipoma excision  2010    "left occipital area"  . Cataract extraction w/ intraocular lens implant  2009/2010    right/left  . Eye surgery    . Oophorectomy      'not sure which one"  . Appendectomy    . Angioplasty  12/10/2012    "LLE"; LSFA [place] orbital rotational arthrectomy, PTA , stenting with IDEV stent (left ABI improved from 0.35 to 0.75)  . Abdominal  hysterectomy  1976  . Breast biopsy  1960's    "think he did both" (12/06/2012)  . Transmetatarsal amputation Left 01/08/2013    Procedure: TRANSMETATARSAL AMPUTATION;  Surgeon: Angelia Mould, MD;  Location: [place];  Service: Vascular;  Laterality: Left;  . Esophagogastroduodenoscopy  2011  . Colonoscopy  2007    Hemorrhoids and Diverticulosis  . Esophagogastroduodenoscopy N/A 03/17/2014    Procedure: ESOPHAGOGASTRODUODENOSCOPY (EGD);  Surgeon: Irene Shipper, MD;  Location: [facility] ENDOSCOPY;  Service: Endoscopy;  Laterality: N/A;    . aspirin  325 mg Oral Daily  . heparin  4,000 Units Intravenous Once  . insulin aspart  0-15  Units Subcutaneous 6 times per day  . ipratropium-albuterol  3 mL Nebulization Q4H  . levofloxacin (LEVAQUIN) IV  750 mg Intravenous Q24H  . lidocaine (cardiac) 100 mg/14ml      . methylPREDNISolone (SOLU-MEDROL) injection  80 mg Intravenous 3 times per day  . metoprolol  2.5 mg Intravenous 4 times per day  . pantoprazole (PROTONIX) IV  40 mg Intravenous Q24H  . piperacillin-tazobactam (ZOSYN)  IV  3.375 g Intravenous Q8H  . rocuronium      . succinylcholine      . vancomycin  750 mg Intravenous Q12H   . sodium chloride 100 mL/hr at 04/05/14 1049  . fentaNYL infusion INTRAVENOUS 25 mcg/hr (04/05/14 1005)  . heparin    . midazolam (VERSED) infusion 2 mg/hr (04/05/14 0910)    Allergies  Allergen Reactions  . Amlodipine Besy-Benazepril Hcl Other (See Comments)    Nervous/shakiness  . Statins Other (See Comments)    "can't take any of them; cramp me up" (12/06/2012)  . Plavix [Clopidogrel] Rash    Pt states that the doctor told her that her rash was psoriasis and not related to plavix but she states the rash came from the plavix  . Raptiva [Efalizumab] Rash    History   Social History  . Marital Status: Widowed    Spouse Name: N/A    Number of Children: 0  . Years of Education: N/A   Occupational History  . 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 Smoking status: Current Some Day Smoker -- 1.00 packs/day for 40 years    Types: Cigarettes  . Smokeless tobacco: Never Used     Comment: pt states that she smokes "every once in a while"  . Alcohol Use: No     Comment: "quit social drinking years ago"  . Drug Use: No  . Sexual Activity: No   Other Topics Concern  . Not on file   Social History Narrative  . No narrative on file    Family History  Problem Relation Age of Onset  . Lupus Sister     x2  . Lung cancer Sister   . Thyroid disease Sister   . Colon cancer Neg Hx   . Cancer Mother   . Heart Problems Maternal Grandmother     . Cancer Paternal Grandfather   . Diabetes Brother     x2  . Arthritis Brother     x3  . Arthritis Sister     x3    Physical Exam: Telemetry: NSR Filed Vitals:   04/05/14 1245 04/05/14 1315 04/05/14 1323 04/05/14 1345  BP: 116/68 115/63 115/63 117/66  Pulse: 77 76  81  Temp: 97 F (36.1 C) 96.8 F (36 C) 96.8 F (36 C) 96.6 F (35.9 C)  TempSrc:   Core (Comment)  Resp: 24 24 24 24   Height:      Weight:      SpO2: 98% 97% 97% 97%   Physical examination: General: Appears acutely ill, mechanically ventilated, synchronous, RASS -1 Neuro: Encephalopathic, nonfocal, cough / gag diminished  HEENT: PERRL, OETT  Cardiovascular: RRR, no m/r/g  Lungs: Bilateral diminished air entry, rhonchi, wheezes  Abdomen: Soft, nontender, bowel sounds diminished  Musculoskeletal: Moves all extremities, no edema, hyperpigmented, bilateral, lower extremities related to PVD. Left Midfoot amputation.   EKG: 04/05/2014: Tachycardia 99. Normal sinus rhythm. ST segment elevation in the 2-3 and before. T wave inversions in aVL. Normal axis. Borderline QTC prolongation.  Note: These ischemic EKG changes were present on the EKG of 03/17/2014, but they somehow increased.  Labs:   Lab Results  Component Value Date   WBC 14.8* 04/05/2014   HGB 8.1* 04/05/2014   HCT 29.5* 04/05/2014   MCV 80.6 04/05/2014   PLT 323 04/05/2014    Recent Labs Lab 04/05/14 0727  NA 141  K 4.5  CL 102  CO2 21  BUN 15  CREATININE 1.16*  CALCIUM 8.4  PROT 6.1  BILITOT 0.3  ALKPHOS 93  ALT 52*  AST 82*  GLUCOSE 341*   Lab Results  Component Value Date   CKTOTAL 609* 10/08/2011   CKMB 16.4* 10/08/2011   TROPONINI 7.05* 04/05/2014       Radiology: Bilateral, pulmonary edema. ET tube in normal position.  Echo: 03/17/2014: EF 35%. Hypokinesis of the distal anterior and lateral walls; akinesis thie mid/distal inferoseptal, distal inferior and apical walls.   ASSESSMENT AND PLAN:  # ? Acute anteroseptal MI :  Elevated troponins. EKG changes with increased ST segment elevations lead in V2, V3 and V4. These changes were present on her prior EKG of 03/17/2014 but are not more prominent. Patient follows with Dr. Gwenlyn Found of [facility]. 2-D echo 03/17/2014 with an EF of 35% and diffuse hypokinesis. Myoview scan 12/05/2012 was normal. No prior history of cardiac catheterization. Chest x-ray with pulmonary edema. She has significant risk factors for CAD including diabetes, hypertension, and PVD. Lactic acid, somehow improving from 6.57 to 4.1. proBNP elevated at 9378. Blood pressure is currently stable off pressors.  Plan. - Continue with heparin drip. - Will consider ischemic work up (with [facility]) when possible given her risk factors - obtain repeat echo  - prn Metoprolol  - Lipid panel but intolerance to statin  - serial EKG and trop levels  - Hemoglobin Goal for transfusion of 8. - Hold home hydralazine, and Lasix in the setting of acute decompensation.  #Acute on chronic Combined CHF: Physical examination without evidence of overt fluid overload , even though proBNP elevated at 9000. 2-D echo 03/17/2014 with an EF of 35% and diffuse hypokinesis. Myoview scan 12/05/2012 was normal. No prior history of cardiac catheterization. Chest x-ray with pulmonary edema. She received 1L of NS in ed.  Plan. - would gently diurese and avoid aggressive hydration - Obtain repeat echocardiogram to assess LV function - Monitor electrolytes and replace as needed - keep her even to negative  #Acute respiratory failure: With reported pulmonary edema on chest x-ray.  Likely related to problem #1 above  versus COPD. ABGs with improvement after mechanical ventilation with a pH of 7.12 improving to 7.33. Aspiration Pneumonia also considered. Plan -Continue with mechanical ventilation per critical care - Continue with Solu-Medrol - On vancomycin and Zosyn  #Hypertension: Blood pressure currently stable. Noted increased lactic acid  level of 6.5. Hold home  medications for hypertension.  #Diabetes with hyperglycemia: Insulin therapy.  # Anemia: Likely related to GI bleeding with FOBT 4/19. Hemoglobin level of 8.1. Transfuse when hemoglobin is <8.0.   I have discussed with Dr Rayann Heman.   Signed:  Jessee Avers, MD PGY-2 Internal Medicine Teaching Service Pager: [phone] 04/05/2014, 3:20 PM   I have seen, examined the patient, and reviewed the above assessment and plan.  Changes to above are made where necessary.  The patient presents with acute hypercarbic respiratory failure.  She is felt by pulmonary to have primarily a lung process and pneumonia is suspected.   She does have anterior ST segment elevation which is more prominent that prior ekgs but not completely new.  She does have significant elevation in cardiac markers but in the setting of acute respiratory failure and mixed acidosis.  She is presently too ill for cath. I would therefore favor medical management at this time.  Continue IV heparin, ASA, and IV metoprolol.  Would diurese as tolerated and avoid aggressive fluid administration.  She may benefit from cath once medically more stable.  Co Sign: Thompson Grayer, MD 04/05/2014 4:20 PM

## 2014-04-05 NOTE — ED Notes (Signed)
Attempted report 

## 2014-04-05 NOTE — Progress Notes (Signed)
Elevated troponin reported:  ASA Heparin gtt Statin contraindicated - intolerance Metoprolol 2.5 q6h Cardiology consult

## 2014-04-05 NOTE — ED Notes (Signed)
Pt moving and attempting to pull at ETT. Sedation given

## 2014-04-05 NOTE — ED Notes (Signed)
Pt given 2 mg versed push for sedation , pt coming up out of bed pulling at ET tube

## 2014-04-05 NOTE — Progress Notes (Signed)
Pt transported from ED to 2M03 on vent. No complications noted.

## 2014-04-05 NOTE — Progress Notes (Signed)
ANTIBIOTIC CONSULT NOTE - INITIAL  Pharmacy Consult for Vancomycin, Zosyn Indication: pneumonia  Allergies  Allergen Reactions  . Amlodipine Besy-Benazepril Hcl Other (See Comments)    Nervous/shakiness  . Statins Other (See Comments)    "can't take any of them; cramp me up" (12/06/2012)  . Plavix [Clopidogrel] Rash    Pt states that the doctor told her that her rash was psoriasis and not related to plavix but she states the rash came from the plavix  . Raptiva [Efalizumab] Rash    Patient Measurements: Height: 5' 4.17" (163 cm) Weight: 173 lb 4.5 oz (78.6 kg) IBW/kg (Calculated) : 55.1 Adjusted Body Weight: n/a  Vital Signs: Temp: 97.9 F (36.6 C) (05/09 1004) Temp src: Core (Comment) (05/09 1004) BP: 128/64 mmHg (05/09 1004) Pulse Rate: 93 (05/09 0845) Intake/Output from previous day:   Intake/Output from this shift:    Labs:  Recent Labs  04/05/14 0727  WBC 14.8*  HGB 8.1*  PLT 323  CREATININE 1.16*   Estimated Creatinine Clearance: 43.3 ml/min (by C-G formula based on Cr of 1.16). No results found for this basename: VANCOTROUGH, Corlis Leak, VANCORANDOM, [place], GENTPEAK, GENTRANDOM, TOBRATROUGH, TOBRAPEAK, TOBRARND, AMIKACINPEAK, AMIKACINTROU, AMIKACIN,  in the last 72 hours   Microbiology: Recent Results (from the past 720 hour(s))  MRSA PCR SCREENING     Status: None   Collection Time    03/16/14  5:45 PM      Result Value Ref Range Status   MRSA by PCR NEGATIVE  NEGATIVE Final   Comment:            The GeneXpert MRSA Assay (FDA     approved for NASAL specimens     only), is one component of a     comprehensive MRSA colonization     surveillance program. It is not     intended to diagnose MRSA     infection nor to guide or     monitor treatment for     MRSA infections.    Medical History: Past Medical History  Diagnosis Date  . COPD (chronic obstructive pulmonary disease)   . Hypertension   . Asthma   . Psoriatic arthritis   . Psoriasis    . GERD (gastroesophageal reflux disease)   . Hiatal hernia   . Chronic sinus infection   . Heart murmur   . Blood transfusion ~ 1962    "11 units of blood"  . Ankylosing spondylitis   . Chronic back pain   . Dysrhythmia     "palpitations"  . Esophageal dilatation   . Candida esophagitis   . Cirrhosis of liver without mention of alcohol   . Hypercholesteremia   . Peripheral vascular disease   . Exertional dyspnea   . Pneumonia 10/07/11; 2006; 1980's  . Chronic bronchitis     "since childhood" (12/06/2012)  . Type II diabetes mellitus   . Iron deficiency anemia   . External bleeding hemorrhoids   . Sinus headache   . Arthritis     "psorasic"  . OA (osteoarthritis)   . DJD (degenerative joint disease) of knee     "both" (12/06/2012)  . Carpal tunnel syndrome on right   . Anxiety   . Barrett esophagus   . Non-healing ulcer of foot, secondary to diabetes and PVD 12/07/2012  . PVD (peripheral vascular disease) 12/07/2012  . Tobacco abuse 12/07/2012  . Critical lower limb ischemia, with ulcer Lt foot 12/07/2012  . Gout   . COPD (chronic obstructive pulmonary disease)   .  Carotid artery disease     dopplers showed mod L ICA stenosis   . PAD (peripheral artery disease)   . History of nuclear stress test 11/2012    lexiscan; normal, low risk   . Dermatophytosis of nail [id number]  . Psoriasis   . GI bleed   . Diverticulosis     Medications:   (Not in a hospital admission) Assessment: 51 YOF with COPD brought to [facility] ED in respiratory distress and now intubated. Pharmacy consulted to dose empiric Zosyn and Vancomycin for pneumonia. WBC elevated at 14.8, LA 6.57. Pt is afebrile. CrCl~ 43 mL/min   Cultures: 5/9 Blood Cx x2>> 5/9 Trach aspirate >>   Goal of Therapy:  Vancomycin trough level 15-20 mcg/ml  Plan:  1) Zosyn 3.375 gm IV Q 8 hours 2) Vancomycin 750 mg IV Q 12 hours  3) Monitor CBC, renal fx, cultures and patient's clinical progress 4) Collect vancomycin trough at  steady state  Albertina Parr, PharmD.  Clinical Pharmacist Pager [phone]

## 2014-04-05 NOTE — Progress Notes (Signed)
Chaplain met family, pt's sister, and notified Dr. Dockery of her location in consult B.  

## 2014-04-05 NOTE — Progress Notes (Signed)
ANTICOAGULATION CONSULT NOTE - Initial Consult  Pharmacy Consult for Heparin Indication: chest pain/ACS  Allergies  Allergen Reactions  . Amlodipine Besy-Benazepril Hcl Other (See Comments)    Nervous/shakiness  . Statins Other (See Comments)    "can't take any of them; cramp me up" (12/06/2012)  . Plavix [Clopidogrel] Rash    Pt states that the doctor told her that her rash was psoriasis and not related to plavix but she states the rash came from the plavix  . Raptiva [Efalizumab] Rash   Vital Signs: Temp: 96.6 F (35.9 C) (05/09 1345) Temp src: Core (Comment) (05/09 1323) BP: 117/66 mmHg (05/09 1345) Pulse Rate: 81 (05/09 1345)  Labs:  Recent Labs  04/05/14 0727 04/05/14 0742 04/05/14 1135  HGB 8.1*  --   --   HCT 29.5*  --   --   PLT 323  --   --   CREATININE 1.16*  --   --   TROPONINI  --  <0.30 7.05*    Estimated Creatinine Clearance: 43.3 ml/min (by C-G formula based on Cr of 1.16).   Medical History: Past Medical History  Diagnosis Date  . COPD (chronic obstructive pulmonary disease)   . Hypertension   . Asthma   . Psoriatic arthritis   . Psoriasis   . GERD (gastroesophageal reflux disease)   . Hiatal hernia   . Chronic sinus infection   . Heart murmur   . Blood transfusion ~ 1962    "11 units of blood"  . Ankylosing spondylitis   . Chronic back pain   . Dysrhythmia     "palpitations"  . Esophageal dilatation   . Candida esophagitis   . Cirrhosis of liver without mention of alcohol   . Hypercholesteremia   . Peripheral vascular disease   . Exertional dyspnea   . Pneumonia 10/07/11; 2006; 1980's  . Chronic bronchitis     "since childhood" (12/06/2012)  . Type II diabetes mellitus   . Iron deficiency anemia   . External bleeding hemorrhoids   . Sinus headache   . Arthritis     "psorasic"  . OA (osteoarthritis)   . DJD (degenerative joint disease) of knee     "both" (12/06/2012)  . Carpal tunnel syndrome on right   . Anxiety   . Barrett  esophagus   . Non-healing ulcer of foot, secondary to diabetes and PVD 12/07/2012  . PVD (peripheral vascular disease) 12/07/2012  . Tobacco abuse 12/07/2012  . Critical lower limb ischemia, with ulcer Lt foot 12/07/2012  . Gout   . COPD (chronic obstructive pulmonary disease)   . Carotid artery disease     dopplers showed mod L ICA stenosis   . PAD (peripheral artery disease)   . History of nuclear stress test 11/2012    lexiscan; normal, low risk   . Dermatophytosis of nail [id number]  . Psoriasis   . GI bleed   . Diverticulosis     Assessment: 73 year old female to begin IV heparin for rule out MI (troponin positive)  Goal of Therapy:  Heparin level 0.3-0.7 units/ml Monitor platelets by anticoagulation protocol: Yes   Plan:  1) Heparin 4000 units iv bolus x 1 2) Heparin drip at 1100 units / hr 3) Heparin level 6 hours after heparin starts 4) Daily heparin level, CBC  Thank you. Anette Guarneri, PharmD [phone]  04/05/2014,1:53 PM

## 2014-04-05 NOTE — ED Notes (Signed)
Pt intubated due to sats of 55% while being bagged, pt placed on bipap pre intubation sats up to 98 % prior to intubation  . Intubated with 7.5 et tube by MD 23 at lip and placed on vent

## 2014-04-06 ENCOUNTER — Encounter (HOSPITAL_COMMUNITY): Payer: Self-pay | Admitting: *Deleted

## 2014-04-06 DIAGNOSIS — I319 Disease of pericardium, unspecified: Secondary | ICD-10-CM

## 2014-04-06 DIAGNOSIS — I200 Unstable angina: Secondary | ICD-10-CM

## 2014-04-06 DIAGNOSIS — I213 ST elevation (STEMI) myocardial infarction of unspecified site: Secondary | ICD-10-CM | POA: Diagnosis present

## 2014-04-06 DIAGNOSIS — E119 Type 2 diabetes mellitus without complications: Secondary | ICD-10-CM

## 2014-04-06 LAB — BASIC METABOLIC PANEL
BUN: 21 mg/dL (ref 6–23)
CO2: 22 mEq/L (ref 19–32)
Calcium: 8.8 mg/dL (ref 8.4–10.5)
Chloride: 100 mEq/L (ref 96–112)
Creatinine, Ser: 1.18 mg/dL — ABNORMAL HIGH (ref 0.50–1.10)
GFR calc Af Amer: 51 mL/min — ABNORMAL LOW (ref >90)
GFR, EST NON AFRICAN AMERICAN: 44 mL/min — AB (ref >90)
GLUCOSE: 266 mg/dL — AB (ref 70–99)
POTASSIUM: 4.6 meq/L (ref 3.7–5.3)
SODIUM: 137 meq/L (ref 137–147)

## 2014-04-06 LAB — CBC
HCT: 28.5 % — ABNORMAL LOW (ref 36.0–46.0)
HEMOGLOBIN: 8.1 g/dL — AB (ref 12.0–15.0)
MCH: 22.1 pg — ABNORMAL LOW (ref 26.0–34.0)
MCHC: 28.4 g/dL — AB (ref 30.0–36.0)
MCV: 77.7 fL — ABNORMAL LOW (ref 78.0–100.0)
PLATELETS: 231 10*3/uL (ref 150–400)
RBC: 3.67 MIL/uL — ABNORMAL LOW (ref 3.87–5.11)
RDW: 28.6 % — ABNORMAL HIGH (ref 11.5–15.5)
WBC: 12.4 10*3/uL — ABNORMAL HIGH (ref 4.0–10.5)

## 2014-04-06 LAB — LIPID PANEL
CHOLESTEROL: 153 mg/dL (ref 0–200)
HDL: 62 mg/dL (ref >39)
LDL CALC: 81 mg/dL (ref 0–99)
TRIGLYCERIDES: 50 mg/dL (ref <150)
Total CHOL/HDL Ratio: 2.5 RATIO
VLDL: 10 mg/dL (ref 0–40)

## 2014-04-06 LAB — GLUCOSE, CAPILLARY
GLUCOSE-CAPILLARY: 168 mg/dL — AB (ref 70–99)
Glucose-Capillary: 111 mg/dL — ABNORMAL HIGH (ref 70–99)
Glucose-Capillary: 145 mg/dL — ABNORMAL HIGH (ref 70–99)
Glucose-Capillary: 195 mg/dL — ABNORMAL HIGH (ref 70–99)
Glucose-Capillary: 216 mg/dL — ABNORMAL HIGH (ref 70–99)
Glucose-Capillary: 254 mg/dL — ABNORMAL HIGH (ref 70–99)
Glucose-Capillary: 261 mg/dL — ABNORMAL HIGH (ref 70–99)

## 2014-04-06 LAB — TROPONIN I: Troponin I: 7.05 ng/mL (ref <0.30)

## 2014-04-06 LAB — LACTIC ACID, PLASMA: Lactic Acid, Venous: 1.5 mmol/L (ref 0.5–2.2)

## 2014-04-06 LAB — PROCALCITONIN: Procalcitonin: 4.28 ng/mL

## 2014-04-06 LAB — HEPARIN LEVEL (UNFRACTIONATED): HEPARIN UNFRACTIONATED: 0.47 [IU]/mL (ref 0.30–0.70)

## 2014-04-06 MED ORDER — BUDESONIDE-FORMOTEROL FUMARATE 160-4.5 MCG/ACT IN AERO
2.0000 | INHALATION_SPRAY | Freq: Two times a day (BID) | RESPIRATORY_TRACT | Status: DC
  Administered 2014-04-06: 2 via RESPIRATORY_TRACT
  Filled 2014-04-06: qty 6

## 2014-04-06 MED ORDER — BRINZOLAMIDE 1 % OP SUSP
1.0000 [drp] | Freq: Three times a day (TID) | OPHTHALMIC | Status: DC
  Administered 2014-04-06 – 2014-04-21 (×44): 1 [drp] via OPHTHALMIC
  Filled 2014-04-06 (×2): qty 10

## 2014-04-06 MED ORDER — FUROSEMIDE 10 MG/ML IJ SOLN
40.0000 mg | Freq: Once | INTRAMUSCULAR | Status: AC
  Administered 2014-04-06: 40 mg via INTRAVENOUS

## 2014-04-06 MED ORDER — TIOTROPIUM BROMIDE MONOHYDRATE 18 MCG IN CAPS
18.0000 ug | ORAL_CAPSULE | Freq: Every day | RESPIRATORY_TRACT | Status: DC
  Filled 2014-04-06: qty 5

## 2014-04-06 MED ORDER — JEVITY 1.2 CAL PO LIQD
1000.0000 mL | ORAL | Status: DC
  Administered 2014-04-06: 12:00:00
  Filled 2014-04-06 (×2): qty 1000

## 2014-04-06 MED ORDER — LATANOPROST 0.005 % OP SOLN
1.0000 [drp] | Freq: Every day | OPHTHALMIC | Status: DC
  Administered 2014-04-06 – 2014-04-20 (×15): 1 [drp] via OPHTHALMIC
  Filled 2014-04-06 (×2): qty 2.5

## 2014-04-06 MED ORDER — IPRATROPIUM-ALBUTEROL 0.5-2.5 (3) MG/3ML IN SOLN
3.0000 mL | RESPIRATORY_TRACT | Status: DC | PRN
  Administered 2014-04-06 – 2014-04-15 (×2): 3 mL via RESPIRATORY_TRACT
  Filled 2014-04-06 (×2): qty 3

## 2014-04-06 MED ORDER — PREDNISONE 5 MG/5ML PO SOLN: 40.0000 mg | Freq: Every day | ORAL | Status: DC

## 2014-04-06 MED ORDER — CYCLOSPORINE 0.05 % OP EMUL
1.0000 [drp] | Freq: Two times a day (BID) | OPHTHALMIC | Status: DC
  Administered 2014-04-06 – 2014-04-21 (×30): 1 [drp] via OPHTHALMIC
  Filled 2014-04-06 (×34): qty 1

## 2014-04-06 MED ORDER — HYDROCORTISONE SOD SUCCINATE 100 MG IJ SOLR: 50.0000 mg | Freq: Four times a day (QID) | INTRAMUSCULAR | Status: DC

## 2014-04-06 MED ORDER — PREDNISONE 5 MG/5ML PO SOLN
40.0000 mg | Freq: Every day | ORAL | Status: DC
  Administered 2014-04-07: 40 mg
  Filled 2014-04-06 (×3): qty 40

## 2014-04-06 MED ORDER — BRIMONIDINE TARTRATE 0.2 % OP SOLN
1.0000 [drp] | Freq: Three times a day (TID) | OPHTHALMIC | Status: DC
  Administered 2014-04-06 – 2014-04-21 (×46): 1 [drp] via OPHTHALMIC
  Filled 2014-04-06 (×2): qty 5

## 2014-04-06 MED ORDER — INSULIN GLARGINE 100 UNIT/ML ~~LOC~~ SOLN
10.0000 [IU] | Freq: Every day | SUBCUTANEOUS | Status: DC
  Administered 2014-04-06 – 2014-04-12 (×7): 10 [IU] via SUBCUTANEOUS
  Filled 2014-04-06 (×8): qty 0.1

## 2014-04-06 MED ORDER — FUROSEMIDE 10 MG/ML IJ SOLN
INTRAMUSCULAR | Status: AC
  Filled 2014-04-06: qty 4

## 2014-04-06 MED ORDER — BIOTENE DRY MOUTH MT LIQD
15.0000 mL | Freq: Two times a day (BID) | OROMUCOSAL | Status: DC
  Administered 2014-04-06: 15 mL via OROMUCOSAL

## 2014-04-06 MED ORDER — ALUM & MAG HYDROXIDE-SIMETH 200-200-20 MG/5ML PO SUSP
15.0000 mL | Freq: Three times a day (TID) | ORAL | Status: DC | PRN
  Administered 2014-04-06: 15 mL via ORAL
  Filled 2014-04-06: qty 30

## 2014-04-06 MED ORDER — DEXMEDETOMIDINE HCL IN NACL 200 MCG/50ML IV SOLN
0.4000 ug/kg/h | INTRAVENOUS | Status: DC
  Administered 2014-04-06: 0.8 ug/kg/h via INTRAVENOUS
  Filled 2014-04-06 (×2): qty 50

## 2014-04-06 MED ORDER — PREDNISONE 5 MG/ML PO CONC
40.0000 mg | Freq: Every day | ORAL | Status: DC
  Filled 2014-04-06: qty 8

## 2014-04-06 NOTE — Progress Notes (Signed)
  Echocardiogram 2D Echocardiogram has been performed.  Carney Corners 04/06/2014, 6:58 PM

## 2014-04-06 NOTE — Procedures (Signed)
Extubation Procedure Note  Patient Details:   Name: [name] DOB: 1941/03/11 MRN: [id number]   Airway Documentation:     Evaluation  O2 sats: stable throughout and currently acceptable Complications: No apparent complications Patient did tolerate procedure well. Bilateral Breath Sounds: Clear;Diminished Suctioning: Airway Yes  Luvenia Redden [place] 04/06/2014, 2:52 PM

## 2014-04-06 NOTE — Progress Notes (Signed)
Wasted in sink 230 mL of Fentanyl gtt and 10 mL of Versed with Delrae Rend, RN.  Olevia Perches, RN

## 2014-04-06 NOTE — Progress Notes (Addendum)
Brief Nutrition Note  Consult received for enteral/tube feeding initiation and management.  Adult Enteral Nutrition Protocol initiated. Full assessment to follow.  Jevity 1.2 @ 10 ml/hr as ordered per NP.  Admitting Dx: COPD (chronic obstructive pulmonary disease) [496] Acute exacerbation of chronic obstructive pulmonary disease (COPD) [491.21] CHF exacerbation [428.0] Respiratory failure requiring intubation [518.81]  Body mass index is 31.09 kg/(m^2). Pt meets criteria for obesity based on current BMI.  Labs:   Recent Labs Lab 04/05/14 0727 04/06/14 0240  NA 141 137  K 4.5 4.6  CL 102 100  CO2 21 22  BUN 15 21  CREATININE 1.16* 1.18*  CALCIUM 8.4 8.8  GLUCOSE 341* 266*    Terrace Arabia RD, LDN

## 2014-04-06 NOTE — Progress Notes (Signed)
Dr Deterding([place]) made aware of patient's decreased urinary output throughout the shift. No new orders obtained.

## 2014-04-06 NOTE — Progress Notes (Signed)
SUBJECTIVE: She attempting weaning off vent this am. No overnight events. Appears to be improving.   Marland Kitchen antiseptic oral rinse  15 mL Mouth Rinse q12n4p  . aspirin  325 mg Oral Daily  . brimonidine  1 drop Right Eye TID   And  . brinzolamide  1 drop Right Eye TID  . chlorhexidine  15 mL Mouth Rinse BID  . cycloSPORINE  1 drop Both Eyes BID  . insulin aspart  0-15 Units Subcutaneous 6 times per day  . ipratropium-albuterol  3 mL Nebulization Q4H  . latanoprost  1 drop Both Eyes QHS  . levofloxacin (LEVAQUIN) IV  750 mg Intravenous Q24H  . methylPREDNISolone (SOLU-MEDROL) injection  80 mg Intravenous 3 times per day  . metoprolol  2.5 mg Intravenous 4 times per day  . pantoprazole (PROTONIX) IV  40 mg Intravenous Q24H  . piperacillin-tazobactam (ZOSYN)  IV  3.375 g Intravenous Q8H  . vancomycin  750 mg Intravenous Q12H   . sodium chloride 100 mL/hr at 04/06/14 0602  . feeding supplement (JEVITY 1.2 CAL)    . fentaNYL infusion INTRAVENOUS 150 mcg/hr (04/06/14 0800)  . heparin 1,100 Units/hr (04/05/14 1504)  . midazolam (VERSED) infusion 4 mg/hr (04/06/14 0228)    OBJECTIVE: Physical Exam: Filed Vitals:   04/06/14 0500 04/06/14 0600 04/06/14 0700 04/06/14 0800  BP: 102/58 108/66 112/59 124/70  Pulse: 73 73 76 92  Temp: 98.5 F (36.9 C) 98.4 F (36.9 C) 98.4 F (36.9 C) 98.3 F (36.8 C)  TempSrc:      Resp: 24 24 24 16   Height:      Weight: 182 lb 1.6 oz (82.6 kg)     SpO2: 100% 100% 96% 98%    Intake/Output Summary (Last 24 hours) at 04/06/14 0850 Last data filed at 04/06/14 0700  Gross per 24 hour  Intake 2986.45 ml  Output    428 ml  Net 2558.45 ml    Telemetry reveals sinus rhythm  General: Appears better today, mechanically ventilated, synchronous, follow commands and awake.   HEENT: PERRL, OETT  Cardiovascular: RRR, no m/r/g  Lungs: Bilateral diminished air entry, rhonchi, wheezes  Abdomen: Soft, nontender, bowel sounds diminished  Musculoskeletal:  Moves all extremities, no edema, hyperpigmented, bilateral, lower extremities related to PVD. Left Midfoot amputation.   LABS: Basic Metabolic Panel:  Recent Labs  04/05/14 0727 04/06/14 0240  NA 141 137  K 4.5 4.6  CL 102 100  CO2 21 22  GLUCOSE 341* 266*  BUN 15 21  CREATININE 1.16* 1.18*  CALCIUM 8.4 8.8   Liver Function Tests:  Recent Labs  04/05/14 0727  AST 82*  ALT 52*  ALKPHOS 93  BILITOT 0.3  PROT 6.1  ALBUMIN 2.9*   No results found for this basename: LIPASE, AMYLASE,  in the last 72 hours CBC:  Recent Labs  04/05/14 0727 04/06/14 0240  WBC 14.8* 12.4*  NEUTROABS 9.8*  --   HGB 8.1* 8.1*  HCT 29.5* 28.5*  MCV 80.6 77.7*  PLT 323 231   Cardiac Enzymes:  Recent Labs  04/05/14 1135 04/05/14 1532 04/05/14 2210  TROPONINI 7.05* >20.00* >20.00*   RADIOLOGY: Dg Chest Port 1 View  04/05/2014   CLINICAL DATA:  Orogastric tube placement at bedside.  EXAM: PORTABLE CHEST - 1 VIEW  COMPARISON:  DG CHEST 1V PORT dated 04/05/2014; DG CHEST 1V PORT dated 03/16/2014; DG CHEST 1V PORT dated 12/01/2013; DG CHEST 1V PORT dated 11/29/2013  FINDINGS: Orogastric tube courses below the diaphragm  into the stomach with its tip in the proximal body. Endotracheal tube tip is in the right mainstem bronchus. Cardiac silhouette enlarged. Interval improvement in the interstitial pulmonary edema since the examination earlier same date. Mild atelectasis in the lung bases, right greater than left, likely related to suboptimal inspiration.  IMPRESSION: 1. OG tube tip in the proximal body of the stomach. 2. Endotracheal tube tip in the proximal right mainstem bronchus. This should be withdrawn 3-4 cm. 3. Improved interstitial pulmonary edema since the examination earlier today. 4. Mild bibasilar atelectasis, right greater than left, likely due to suboptimal inspiration. These results were called by telephone at the time of interpretation on 04/05/2014 at 4:36 PM to [facility], the nurse caring for  the patient in the ICU, who verbally acknowledged these results.   Electronically Signed   By: Evangeline Dakin M.D.   On: 04/05/2014 16:38   Dg Chest Port 1 View  04/05/2014   CLINICAL DATA:  Respiratory distress, endotracheal tube placed  EXAM: PORTABLE CHEST - 1 VIEW  COMPARISON:  DG CHEST 1V PORT dated 03/16/2014; DG CHEST 1V PORT dated 12/01/2013; DG CHEST 1V PORT dated 11/29/2013  FINDINGS: Endotracheal tube tip is 1 cm above the carinal. There is moderate cardiac enlargement. There is vascular congestion. There is moderate interstitial prominence suggesting pulmonary edema, a change from prior study.  IMPRESSION: Pulmonary edema.  Endotracheal tube tip 1 cm above carina.   Electronically Signed   By: Skipper Cliche M.D.   On: 04/05/2014 08:07   Dg Chest Portable 1 View  03/16/2014   CLINICAL DATA:  Shortness of breath. Panic attack. COPD. Exertional asthma.  EXAM: PORTABLE CHEST - 1 VIEW  COMPARISON:  12/01/2013  FINDINGS: Mild to moderate cardiomegaly is stable. Both lungs remain clear. No evidence of pleural effusion. Mild tortuosity of thoracic aorta appears stable.  IMPRESSION: Stable cardiomegaly.  No acute findings.   Electronically Signed   By: Earle Gell M.D.   On: 03/16/2014 12:05   Dg Abd Portable 1v  03/16/2014   CLINICAL DATA:  Abdominal distention and discomfort.  EXAM: PORTABLE ABDOMEN - 1 VIEW  COMPARISON:  Abdominal radiograph 12/04/2013.  FINDINGS: Gas and stool are seen scattered throughout the colon extending to the level of the distal rectum. No pathologic distension of small bowel is noted. No gross evidence of pneumoperitoneum.  IMPRESSION: 1.  Nonobstructive bowel gas pattern. 2. No pneumoperitoneum.   Electronically Signed   By: Vinnie Langton M.D.   On: 03/16/2014 17:04    ASSESSMENT AND PLAN:  Principal Problem:   Acute respiratory failure Active Problems:   DIABETES-TYPE 2   ANEMIA, IRON DEFICIENCY   HYPERTENSION   Acute exacerbation of chronic obstructive pulmonary  disease (COPD)   GI bleeding   COPD (chronic obstructive pulmonary disease)   STEMI (ST elevation myocardial infarction)  # ? Acute anteroseptal MI : troponin have trend up 7 to >20 this a.m. EKG changes with increased ST segment elevations lead in V2, V3 and V4 but these are not entirely new when compared to EKG of 03/17/2014 2-D echo 03/17/2014 with an EF of 35% and diffuse hypokinesis. Myoview scan 12/05/2012 was normal. No prior history of cardiac catheterization. Chest x-ray with pulmonary edema versus infectious process.  Plan.  - Continue with heparin drip for now.  - Will consider cath as soon as she is stable enough for it - obtain repeat echo - pending  - prn Metoprolol  - patient has intolerance to statins due to  severe cramps - obtain ekg to assess for more changes of ischemia  - Hemoglobin Goal for transfusion of 8.  - Hold home hydralazine, and Lasix in the setting of acute decompensation.   #Acute on chronic Combined CHF: Physical examination without evidence of overt fluid overload, even though proBNP elevated at 9000. 2-D echo 03/17/2014 with an EF of 35% and diffuse hypokinesis. No prior history of cardiac catheterization. Chest x-ray with pulmonary edema. Net 2.5L since admission.  Plan.  - would gently diurese and avoid aggressive hydration  - 2d echo hopefully today - Monitor electrolytes and replace as needed  - a little up on volume  #Acute respiratory failure: With reported pulmonary edema on chest x-ray. Likely related to problem #1 above versus COPD. ABGs with improvement after mechanical ventilation with a pH of 7.12 improving to 7.33. Aspiration Pneumonia also considered.  Plan  - to attempt extubation today -resp support per critical care  - on Solu-Medrol  - On vancomycin and Zosyn   #Hypertension: Pt was in metabolic acidosis with elevated lactic acid level on admission. She has not required pressors. Blood pressure currently stable Plan  - Hold home  medications for hypertension.  - would benefit from BBL as a HTN medication of choice once stable.  #Diabetes with hyperglycemia: Insulin therapy.   # Anemia: Likely related to GI bleeding with FOBT 4/19. Hemoglobin level of 8.1. Transfuse when hemoglobin is <8.0.    I have discussed with Dr Rayann Heman.   Signed:  Jessee Avers, MD PGY-2 Internal Medicine Teaching Service Pager: [phone] 04/06/2014, 9:10 AM   I have seen, examined the patient, and reviewed the above assessment and plan.  Changes to above are made where necessary.  The patients ST segments appear to be improving s/p anterior MI.  She clinically is a little more stable today.  Hopefully, we can extubate soon.  I would anticipate cath once she is more stable and extubated. Continue heparin gtt and current medical therapy for now.  Co Sign: Thompson Grayer, MD 04/06/2014 12:33 PM

## 2014-04-06 NOTE — Progress Notes (Signed)
UR Completed.  [name] [id number] 04/06/2014

## 2014-04-06 NOTE — Progress Notes (Signed)
ANTICOAGULATION CONSULT NOTE - Follow Up Consult  Pharmacy Consult for heparin Indication: chest pain/ACS  Labs:  Recent Labs  04/05/14 0727  04/05/14 1135 04/05/14 1532 04/05/14 2210 04/06/14 0240  HGB 8.1*  --   --   --   --  8.1*  HCT 29.5*  --   --   --   --  28.5*  PLT 323  --   --   --   --  231  HEPARINUNFRC  --   --   --   --  0.50 0.47  CREATININE 1.16*  --   --   --   --  1.18*  TROPONINI  --   < > 7.05* >20.00* >20.00*  --   < > = values in this interval not displayed.   Assessment/Plan:   64 YOF with COPD admitted 04/05/2014  via [facility] ED in respiratory distress and now intubated. Pharmacy consulted to dose empiric Zosyn and Vancomycin for pneumonia.   Events: intubated in ICU, Tp trending up.  PMH: COPD, HTN, Asthma, GERD, Cirrhosis, DM, PAD   ID: Aspiration PNA WBC trend down, LA 6.57>1.5. PCT 4.28 Pt is afebrile. CrCl~17mL/min  CULTURES:  5/9 Blood >>>  5/9 Respiratory >>>  ANTIBIOTICS:  Zosyn 5/9 >>>  Vancomycin 5/9 >>>  Levaquin 5/9 >>>   AC/Heme: ACS, troponins trending up on heparin at 1100/hr, no bleeding noted, no labs this am.  CV: CAD, MI, HF EF 35% Meds: [place], ASA  Endo: DM   Goal of Therapy:  Vancomycin trough level 15-20 mcg/ml  Plan:  1) Zosyn 3.375 gm IV Q 8 hours 2) Vancomycin 750 mg IV Q 12 hours  3) Monitor CBC, renal fx, cultures and patient's clinical progress 4) Collect vancomycin trough at steady state 5) Continue heparin at 1100 units, follow up am labs  Thank you for allowing pharmacy to be a part of this patients care team.  Rowe Robert Pharm.D., BCPS, AQ-Cardiology Clinical Pharmacist 04/06/2014 11:15 AM Pager: [phone] Phone: [phone]

## 2014-04-06 NOTE — Progress Notes (Signed)
Notified Dr. Earnest Conroy of low urine output at 1300, with verbal to continue monitoring. At 1600, I flushed the catheter, and received an added 5 mL yellow output on top of flush amount. At [place], I attempted to flush again due to continued lack of output. The foley broke apart, so it was properly removed without any incident. The end of the foley had a minimal amount of reddish sediment occluding the end. The patient said she needed to urinate, so a bedpan was provided. She urinated 100 mL of dark red urine. Oncoming nurse was notified.  Olevia Perches, RN

## 2014-04-06 NOTE — H&P (Addendum)
PULMONARY / CRITICAL CARE MEDICINE  Name: Shelby MCCLANE MRN: [id number] DOB: 03-Mar-1941    ADMISSION DATE:  04/05/2014 CONSULTATION DATE:  04/05/2014  REFERRING MD :  EDP PRIMARY SERVICE:  PCCM  CHIEF COMPLAINT:  Acute respiratory failure  BRIEF PATIENT DESCRIPTION: 73 yo with COPD / reactive airway disease brought in [facility] ED in respiratory distress on 5/9, intubated.  SIGNIFICANT EVENTS / STUDIES:   LINES / TUBES: OETT 5/9 >>> OGT 5/9 >>> Foley 5/9 >>>  CULTURES: 5/9 Blood >>> 5/9 MRSA >>> neg  ANTIBIOTICS: Zosyn 5/9 >>> Vancomycin 5/9 >>> 5/10 Levaquin 5/9 >>>  INTERVAL HISTORY:  Ruled in for ACS.  VITAL SIGNS: Temp:  [96.6 F (35.9 C)-99.1 F (37.3 C)] 98.4 F (36.9 C) (05/10 0700) Pulse Rate:  [65-98] 76 (05/10 0700) Resp:  [20-24] 24 (05/10 0700) BP: (97-133)/(53-69) 112/59 mmHg (05/10 0700) SpO2:  [96 %-100 %] 98 % (05/10 0800) FiO2 (%):  [40 %-100 %] 40 % (05/10 0800) Weight:  [78.6 kg (173 lb 4.5 oz)-82.6 kg (182 lb 1.6 oz)] 82.6 kg (182 lb 1.6 oz) (05/10 0500)  HEMODYNAMICS:   VENTILATOR SETTINGS: Vent Mode:  [-] PSV;CPAP FiO2 (%):  [40 %-100 %] 40 % Set Rate:  [20 bmp-24 bmp] 24 bmp Vt Set:  [450 mL] 450 mL PEEP:  [5 cmH20] 5 cmH20 Pressure Support:  [5 cmH20] 5 cmH20 Plateau Pressure:  [18 cmH20-25 cmH20] 18 cmH20 INTAKE / OUTPUT: Intake/Output     05/09 0701 - 05/10 0700 05/10 0701 - 05/11 0700   I.V. (mL/kg) 2436.5 (29.5)    IV Piggyback 550    Total Intake(mL/kg) 2986.5 (36.2)    Urine (mL/kg/hr) 428    Total Output 428     Net +2558.5            PHYSICAL EXAMINATION: General:  Tolerating SBT well Neuro:  Agitated when stimulated HEENT:  PERRL, OETT / OGT Cardiovascular:  RRR, no m/r/g Lungs:  Bilateral air entry, few rhonchi Abdomen:  Soft, nontender, bowel sounds diminished Musculoskeletal:  Moves all extremities, no edema Skin:  Intact  LABS: CBC  Recent Labs Lab 04/02/14 1025 04/05/14 0727 04/06/14 0240  WBC 10.2  14.8* 12.4*  HGB 8.3 Repeated and verified X2.* 8.1* 8.1*  HCT 27.9* 29.5* 28.5*  PLT 271.0 323 231   Coag's No results found for this basename: APTT, INR,  in the last 168 hours  BMET  Recent Labs Lab 04/05/14 0727 04/06/14 0240  NA 141 137  K 4.5 4.6  CL 102 100  CO2 21 22  BUN 15 21  CREATININE 1.16* 1.18*  GLUCOSE 341* 266*   Electrolytes  Recent Labs Lab 04/05/14 0727 04/06/14 0240  CALCIUM 8.4 8.8   Sepsis Markers  Recent Labs Lab 04/05/14 0811 04/05/14 1135 04/05/14 1532 04/05/14 2210 04/06/14 0240  LATICACIDVEN 6.57* 4.1* 3.8* 4.1*  --   PROCALCITON  --  0.88  --   --  4.28   ABG  Recent Labs Lab 04/05/14 0843 04/05/14 1149  PHART 7.125* 7.331*  PCO2ART 72.2* 44.4  PO2ART 87.0 183.0*   Liver Enzymes  Recent Labs Lab 04/05/14 0727  AST 82*  ALT 52*  ALKPHOS 93  BILITOT 0.3  ALBUMIN 2.9*   Cardiac Enzymes  Recent Labs Lab 04/05/14 0742 04/05/14 1135 04/05/14 1532 04/05/14 2210  TROPONINI <0.30 7.05* >20.00* >20.00*  PROBNP 9378.0*  --   --   --    Glucose  Recent Labs Lab 04/05/14 1229 04/05/14 1506 04/05/14 1922  04/06/14 0009 04/06/14 0312 04/06/14 0703  GLUCAP 303* 382* 343* 261* 254* 195*   IMAGING:  Dg Chest Port 1 View  04/05/2014   CLINICAL DATA:  Orogastric tube placement at bedside.  EXAM: PORTABLE CHEST - 1 VIEW  COMPARISON:  DG CHEST 1V PORT dated 04/05/2014; DG CHEST 1V PORT dated 03/16/2014; DG CHEST 1V PORT dated 12/01/2013; DG CHEST 1V PORT dated 11/29/2013  FINDINGS: Orogastric tube courses below the diaphragm into the stomach with its tip in the proximal body. Endotracheal tube tip is in the right mainstem bronchus. Cardiac silhouette enlarged. Interval improvement in the interstitial pulmonary edema since the examination earlier same date. Mild atelectasis in the lung bases, right greater than left, likely related to suboptimal inspiration.  IMPRESSION: 1. OG tube tip in the proximal body of the stomach. 2.  Endotracheal tube tip in the proximal right mainstem bronchus. This should be withdrawn 3-4 cm. 3. Improved interstitial pulmonary edema since the examination earlier today. 4. Mild bibasilar atelectasis, right greater than left, likely due to suboptimal inspiration. These results were called by telephone at the time of interpretation on 04/05/2014 at 4:36 PM to [facility], the nurse caring for the patient in the ICU, who verbally acknowledged these results.   Electronically Signed   By: Evangeline Dakin M.D.   On: 04/05/2014 16:38   Dg Chest Port 1 View  04/05/2014   CLINICAL DATA:  Respiratory distress, endotracheal tube placed  EXAM: PORTABLE CHEST - 1 VIEW  COMPARISON:  DG CHEST 1V PORT dated 03/16/2014; DG CHEST 1V PORT dated 12/01/2013; DG CHEST 1V PORT dated 11/29/2013  FINDINGS: Endotracheal tube tip is 1 cm above the carinal. There is moderate cardiac enlargement. There is vascular congestion. There is moderate interstitial prominence suggesting pulmonary edema, a change from prior study.  IMPRESSION: Pulmonary edema.  Endotracheal tube tip 1 cm above carina.   Electronically Signed   By: Skipper Cliche M.D.   On: 04/05/2014 08:07   ASSESSMENT / PLAN:  PULMONARY A:   Acute respiratory failure AECOPD Possible pneumonia Possibly pulmonary edema P:   Goal SpO2>92, pH>7.30 Full mechanical support Daily SBT, but mental status limiting extubation Ventilator bundle Trend ABG / CXR Albuterol / Atrovent D/c Solu-Medrol Prednisone 40 Hold Symbicort / Spiriva   CARDIOVASCULAR A:  STEMI Elevated lactate in setting of hypovolemia Chronic systolic / diastolic heart failure H/o HTN P:  Cardiology following Goal MAP>60 Trend troponin (>20) / lactate (4.1) Cardiac cath soon ASA Metoprolol Statins - intolerant Hold Hydralazine TTE  RENAL A:   Clinically hypovolemic AKI P:   Trend BMP NS@100  Hold Lasix  GASTROINTESTINAL A:   Nutrition GERD P:   NPO TF Protonix as  preadmission  HEMATOLOGIC  A:   Anemia No overt hemorrhage Therapeutic Heparin for ACS P:  Trend CBC Heparin gtt per Pharmacy  INFECTIOUS A:   Possible pneumonia, PCT 4.28 P:   Cultures and antibiotics as above D/c Vancomycin  ENDOCRINE  A:   DM Hyperglycemia, partly steroid induced Possible adrenal insufficiency ( on chronic low dose Prednisone )  P:   SSI Add Lantus Hold Tradjenta, Metformin Already on higher dose Prednisone  NEUROLOGIC A:   Acute encephalopathy Delirium  P:   Goal RASS 0 to -1 Daily WUA D/c Fentanyl / Versed gtt Start Precedex  Richardson Landry Minor ACNP Maryanna Shape PCCM Pager [phone] till 3 pm If no answer page [phone] 04/06/2014, 8:27 AM  I have personally obtained history, examined patient, evaluated and interpreted laboratory and imaging  results, reviewed medical records, formulated assessment / plan and placed orders.  CRITICAL CARE:  The patient is critically ill with multiple organ systems failure and requires high complexity decision making for assessment and support, frequent evaluation and titration of therapies, application of advanced monitoring technologies and extensive interpretation of multiple databases. Critical Care Time devoted to patient care services described in this note is 35 minutes.   Doree Fudge, MD Pulmonary and [place] Pager: [phone]  04/06/2014, 1:05 PM

## 2014-04-06 NOTE — Progress Notes (Signed)
Pt voided again via bedpain. Pt voided 200cc of bright red bloody urine. Dr Elsworth Soho at [facility] notified of bloody urine. Will continue to monitor.

## 2014-04-07 ENCOUNTER — Inpatient Hospital Stay (HOSPITAL_COMMUNITY): Payer: Medicare Other

## 2014-04-07 ENCOUNTER — Ambulatory Visit: Payer: Medicare HMO | Admitting: Internal Medicine

## 2014-04-07 DIAGNOSIS — I509 Heart failure, unspecified: Secondary | ICD-10-CM

## 2014-04-07 DIAGNOSIS — I5023 Acute on chronic systolic (congestive) heart failure: Secondary | ICD-10-CM

## 2014-04-07 DIAGNOSIS — J9600 Acute respiratory failure, unspecified whether with hypoxia or hypercapnia: Secondary | ICD-10-CM

## 2014-04-07 DIAGNOSIS — I219 Acute myocardial infarction, unspecified: Secondary | ICD-10-CM

## 2014-04-07 LAB — CBC
HEMATOCRIT: 26.8 % — AB (ref 36.0–46.0)
HEMOGLOBIN: 7.6 g/dL — AB (ref 12.0–15.0)
MCH: 22.3 pg — ABNORMAL LOW (ref 26.0–34.0)
MCHC: 28.4 g/dL — AB (ref 30.0–36.0)
MCV: 78.6 fL (ref 78.0–100.0)
Platelets: 237 10*3/uL (ref 150–400)
RBC: 3.41 MIL/uL — ABNORMAL LOW (ref 3.87–5.11)
RDW: 28.8 % — AB (ref 11.5–15.5)
WBC: 17.8 10*3/uL — AB (ref 4.0–10.5)

## 2014-04-07 LAB — BASIC METABOLIC PANEL
BUN: 28 mg/dL — AB (ref 6–23)
CHLORIDE: 104 meq/L (ref 96–112)
CO2: 21 mEq/L (ref 19–32)
Calcium: 8.6 mg/dL (ref 8.4–10.5)
Creatinine, Ser: 1.5 mg/dL — ABNORMAL HIGH (ref 0.50–1.10)
GFR calc Af Amer: 38 mL/min — ABNORMAL LOW (ref >90)
GFR calc non Af Amer: 33 mL/min — ABNORMAL LOW (ref >90)
Glucose, Bld: 206 mg/dL — ABNORMAL HIGH (ref 70–99)
POTASSIUM: 4.6 meq/L (ref 3.7–5.3)
SODIUM: 140 meq/L (ref 137–147)

## 2014-04-07 LAB — PHOSPHORUS
PHOSPHORUS: 3.7 mg/dL (ref 2.3–4.6)
Phosphorus: 3.1 mg/dL (ref 2.3–4.6)

## 2014-04-07 LAB — HEPARIN LEVEL (UNFRACTIONATED)
Heparin Unfractionated: 0.71 IU/mL — ABNORMAL HIGH (ref 0.30–0.70)
Heparin Unfractionated: 0.43 IU/mL (ref 0.30–0.70)
Heparin Unfractionated: 0.27 IU/mL — ABNORMAL LOW (ref 0.30–0.70)

## 2014-04-07 LAB — GLUCOSE, CAPILLARY
GLUCOSE-CAPILLARY: 71 mg/dL (ref 70–99)
GLUCOSE-CAPILLARY: 110 mg/dL — AB (ref 70–99)
Glucose-Capillary: 208 mg/dL — ABNORMAL HIGH (ref 70–99)
Glucose-Capillary: 203 mg/dL — ABNORMAL HIGH (ref 70–99)
Glucose-Capillary: 125 mg/dL — ABNORMAL HIGH (ref 70–99)
Glucose-Capillary: 74 mg/dL (ref 70–99)

## 2014-04-07 LAB — PROCALCITONIN: Procalcitonin: 3.29 ng/mL

## 2014-04-07 LAB — PREPARE RBC (CROSSMATCH)

## 2014-04-07 LAB — LACTIC ACID, PLASMA: LACTIC ACID, VENOUS: 1.1 mmol/L (ref 0.5–2.2)

## 2014-04-07 LAB — MAGNESIUM: Magnesium: 2.2 mg/dL (ref 1.5–2.5)

## 2014-04-07 MED ORDER — FENTANYL CITRATE 0.05 MG/ML IJ SOLN
INTRAMUSCULAR | Status: AC
  Administered 2014-04-07: 100 ug
  Filled 2014-04-07: qty 4

## 2014-04-07 MED ORDER — MIDAZOLAM HCL 2 MG/2ML IJ SOLN
INTRAMUSCULAR | Status: AC
  Administered 2014-04-07: 2 mg
  Filled 2014-04-07: qty 4

## 2014-04-07 MED ORDER — FUROSEMIDE 10 MG/ML IJ SOLN
40.0000 mg | Freq: Three times a day (TID) | INTRAMUSCULAR | Status: DC
  Administered 2014-04-07 – 2014-04-08 (×3): 40 mg via INTRAVENOUS
  Filled 2014-04-07 (×6): qty 4

## 2014-04-07 MED ORDER — FENTANYL CITRATE 0.05 MG/ML IJ SOLN
50.0000 ug | Freq: Once | INTRAMUSCULAR | Status: AC
  Administered 2014-04-07: 50 ug via INTRAVENOUS

## 2014-04-07 MED ORDER — MIDAZOLAM HCL 2 MG/2ML IJ SOLN: 1.0000 mg | INTRAMUSCULAR | Status: DC | PRN

## 2014-04-07 MED ORDER — IPRATROPIUM-ALBUTEROL 0.5-2.5 (3) MG/3ML IN SOLN
3.0000 mL | Freq: Four times a day (QID) | RESPIRATORY_TRACT | Status: DC
  Administered 2014-04-07 – 2014-04-09 (×9): 3 mL via RESPIRATORY_TRACT
  Filled 2014-04-07 (×10): qty 3

## 2014-04-07 MED ORDER — MIDAZOLAM HCL 5 MG/ML IJ SOLN
0.0000 mg/h | INTRAMUSCULAR | Status: DC
  Administered 2014-04-07: 2 mg/h via INTRAVENOUS
  Filled 2014-04-07: qty 10

## 2014-04-07 MED ORDER — SODIUM CHLORIDE 0.9 % IV SOLN
0.0000 ug/h | INTRAVENOUS | Status: DC
  Administered 2014-04-07: 100 ug/h via INTRAVENOUS
  Administered 2014-04-07: 250 ug/h via INTRAVENOUS
  Administered 2014-04-07 (×2): 100 ug/h via INTRAVENOUS
  Filled 2014-04-07 (×3): qty 50

## 2014-04-07 MED ORDER — ATORVASTATIN CALCIUM 20 MG PO TABS
20.0000 mg | ORAL_TABLET | Freq: Every day | ORAL | Status: DC
  Administered 2014-04-07 – 2014-04-11 (×5): 20 mg
  Filled 2014-04-07 (×5): qty 1

## 2014-04-07 MED ORDER — LEVOFLOXACIN IN D5W 750 MG/150ML IV SOLN
750.0000 mg | INTRAVENOUS | Status: DC
  Administered 2014-04-08: 750 mg via INTRAVENOUS
  Filled 2014-04-07: qty 150

## 2014-04-07 MED ORDER — CHLORHEXIDINE GLUCONATE 0.12 % MT SOLN
15.0000 mL | Freq: Two times a day (BID) | OROMUCOSAL | Status: DC
  Administered 2014-04-07 – 2014-04-08 (×3): 15 mL via OROMUCOSAL
  Filled 2014-04-07 (×3): qty 15

## 2014-04-07 MED ORDER — MIDAZOLAM HCL 5 MG/ML IJ SOLN: 1.0000 mg | INTRAMUSCULAR | Status: DC | PRN

## 2014-04-07 MED ORDER — BIOTENE DRY MOUTH MT LIQD
15.0000 mL | Freq: Four times a day (QID) | OROMUCOSAL | Status: DC
  Administered 2014-04-07 – 2014-04-08 (×7): 15 mL via OROMUCOSAL

## 2014-04-07 MED ORDER — MIDAZOLAM BOLUS VIA INFUSION
1.0000 mg | INTRAVENOUS | Status: DC | PRN
  Filled 2014-04-07: qty 4

## 2014-04-07 MED ORDER — MIDAZOLAM HCL 2 MG/2ML IJ SOLN
1.0000 mg | INTRAMUSCULAR | Status: DC | PRN
  Administered 2014-04-07 (×4): 2 mg via INTRAVENOUS
  Filled 2014-04-07 (×4): qty 2

## 2014-04-07 MED ORDER — FENTANYL BOLUS VIA INFUSION
25.0000 ug | INTRAVENOUS | Status: DC | PRN
  Administered 2014-04-07: 50 ug via INTRAVENOUS
  Filled 2014-04-07: qty 50

## 2014-04-07 MED ORDER — VITAL AF 1.2 CAL PO LIQD
1000.0000 mL | ORAL | Status: DC
  Administered 2014-04-07: 1000 mL
  Filled 2014-04-07 (×4): qty 1000

## 2014-04-07 MED ORDER — BUDESONIDE 0.25 MG/2ML IN SUSP
0.2500 mg | Freq: Two times a day (BID) | RESPIRATORY_TRACT | Status: DC
  Administered 2014-04-07 – 2014-04-21 (×28): 0.25 mg via RESPIRATORY_TRACT
  Filled 2014-04-07 (×32): qty 2

## 2014-04-07 MED ORDER — MIDAZOLAM BOLUS VIA INFUSION
1.0000 mg | INTRAVENOUS | Status: DC | PRN
  Filled 2014-04-07: qty 2

## 2014-04-07 NOTE — Progress Notes (Signed)
RT called to room due to pt self extubating. Pt placed on NRB. MD aware and RN at bedside. Pt in no distress with dim BBS with no stridor present. RT will continue to monitor.

## 2014-04-07 NOTE — H&P (Signed)
PULMONARY / CRITICAL CARE MEDICINE  Name: [name] MRN: [id number] DOB: 1941-08-22    ADMISSION DATE:  04/05/2014 CONSULTATION DATE:  04/05/2014  REFERRING MD :  EDP PRIMARY SERVICE:  PCCM  CHIEF COMPLAINT:  Acute respiratory failure  BRIEF PATIENT DESCRIPTION: 73 yo with COPD / reactive airway disease brought in [facility] ED in respiratory distress on 5/9, intubated.    LINES / TUBES: OETT 5/9 >>>04/06/14, 04/07/14 > OGT 5/9 >>> Foley 5/9 >>>  CULTURES: 5/9 Blood >>> 5/9 MRSA >>> neg   ANTIBIOTICS: Zosyn 5/9 >>> Vancomycin 5/9 >>> 5/10 Levaquin 5/9 >>>  SIGNIFICANT EVENTS / STUDIES:   04/06/14:   Ruled in for ACS.    SUBJECTIVE/OVERNIGHT/INTERVAL HX 04/07/14: Reintubated earlier hours of today.  CXR with airspace diease of pna and chf. On IV heparin. Cards note 04/06/14: plans cath when extubated and stable. Currently somewhat restless but oriented off sedation gtt. Failed SBT earlier due to drowsiness. Not on pressors  Ongoing abdominal v chest pain   VITAL SIGNS: Temp:  [97.8 F (36.6 C)-99.8 F (37.7 C)] 98.3 F (36.8 C) (05/11 0715) Pulse Rate:  [65-112] 66 (05/11 0715) Resp:  [11-38] 24 (05/11 0715) BP: (85-149)/(50-110) 110/65 mmHg (05/11 0715) SpO2:  [87 %-100 %] 100 % (05/11 0715) FiO2 (%):  [40 %-60 %] 40 % (05/11 0804) Weight:  [82.3 kg (181 lb 7 oz)] 82.3 kg (181 lb 7 oz) (05/11 0421)  HEMODYNAMICS:   VENTILATOR SETTINGS: Vent Mode:  [-] PSV;CPAP FiO2 (%):  [40 %-60 %] 40 % Set Rate:  [24 bmp] 24 bmp Vt Set:  [450 mL] 450 mL PEEP:  [5 cmH20] 5 cmH20 Pressure Support:  [5 cmH20-10 cmH20] 10 cmH20 Plateau Pressure:  [23 cmH20] 23 cmH20 INTAKE / OUTPUT: Intake/Output     05/10 0701 - 05/11 0700 05/11 0701 - 05/12 0700   I.V. (mL/kg) 2830.6 (34.4)    NG/GT 56.2    IV Piggyback 450    Total Intake(mL/kg) 3336.7 (40.5)    Urine (mL/kg/hr) 1735 (0.9)    Total Output 1735     Net +1601.7          Urine Occurrence 1 x      PHYSICAL  EXAMINATION: General:  Tolerating SBT well Neuro:  Agitated when stimulated. C/o chest v abdominal pain HEENT:  PERRL, OETT / OGT Cardiovascular:  RRR, no m/r/g Lungs:  Bilateral air entry, no rhonchi Abdomen:  Soft, nontender, bowel sounds diminished Musculoskeletal:  Moves all extremities, no edema Skin:  Intact  LABS:  PULMONARY  Recent Labs Lab 04/05/14 0843 04/05/14 1149  PHART 7.125* 7.331*  PCO2ART 72.2* 44.4  PO2ART 87.0 183.0*  HCO3 23.8 23.6  TCO2 26 25  O2SAT 92.0 100.0    CBC  Recent Labs Lab 04/05/14 0727 04/06/14 0240 04/07/14 0257  HGB 8.1* 8.1* 7.6*  HCT 29.5* 28.5* 26.8*  WBC 14.8* 12.4* 17.8*  PLT 323 231 237    COAGULATION No results found for this basename: INR,  in the last 168 hours  CARDIAC   Recent Labs Lab 04/05/14 0742 04/05/14 1135 04/05/14 1532 04/05/14 2210  TROPONINI <0.30 7.05* >20.00* >20.00*    Recent Labs Lab 04/05/14 0742  PROBNP 9378.0*     CHEMISTRY  Recent Labs Lab 04/05/14 0727 04/06/14 0240 04/07/14 0257  NA 141 137 140  K 4.5 4.6 4.6  CL 102 100 104  CO2 21 22 21   GLUCOSE 341* 266* 206*  BUN 15 21 28*  CREATININE 1.16* 1.18* 1.50*  CALCIUM 8.4 8.8 8.6  MG  --   --  2.2  PHOS  --   --  3.7   Estimated Creatinine Clearance: 34.3 ml/min (by C-G formula based on Cr of 1.5).   LIVER  Recent Labs Lab 04/05/14 0727  AST 82*  ALT 52*  ALKPHOS 93  BILITOT 0.3  PROT 6.1  ALBUMIN 2.9*     INFECTIOUS  Recent Labs Lab 04/05/14 1135  04/05/14 2210 04/06/14 0240 04/06/14 0930 04/07/14 0257 04/07/14 0742  LATICACIDVEN 4.1*  < > 4.1*  --  1.5  --  1.1  PROCALCITON 0.88  --   --  4.28  --  3.29  --   < > = values in this interval not displayed.   ENDOCRINE CBG (last 3)   Recent Labs  04/07/14 0142 04/07/14 0339 04/07/14 0728  GLUCAP 208* 203* 125*         IMAGING x48h  Dg Chest Port 1 View  04/07/2014   CLINICAL DATA:  Evaluate endotracheal tube position  EXAM:  PORTABLE CHEST - 1 VIEW  COMPARISON:  Portable chest x-ray of 04/07/2014  FINDINGS: The tip of the endotracheal tube is approximately 2.8 cm above the carina. There are more prominent and coarse lung markings bilaterally some of which may be due to atelectasis. However mild congestion and small effusions cannot be excluded. Cardiomegaly is stable.  IMPRESSION: 1. Endotracheal tube tip 2.8 cm above the carina. 2. Suspect mild pulmonary vascular congestion and and possibly small effusions with basilar atelectasis.   Electronically Signed   By: Ivar Drape M.D.   On: 04/07/2014 07:21   Dg Chest Port 1 View  04/07/2014   CLINICAL DATA:  Continued respiratory distress status post intubation  EXAM: PORTABLE CHEST - 1 VIEW  COMPARISON:  Film from earlier in the same day  FINDINGS: Cardiac shadow remains enlarged. Persistent right basilar infiltrate is seen. The left lung remains clear. An endotracheal tube is been placed and is 12 mm above the carina and should be withdrawn 2-3 cm. No bony abnormality is noted.  IMPRESSION: Endotracheal tube just above the carina. This should be withdrawn 2-3 cm.  Persistent right basilar changes.  These results were called by telephone at the time of interpretation on 04/07/2014 at 1:00 AM to [facility], the pts nurse, who verbally acknowledged these results.   Electronically Signed   By: Inez Catalina M.D.   On: 04/07/2014 01:02   Dg Chest Port 1 View  04/07/2014   CLINICAL DATA:  Shortness of breath  EXAM: PORTABLE CHEST - 1 VIEW  COMPARISON:  04/05/2014  FINDINGS: The endotracheal tube and nasogastric catheter been removed in the interval. The cardiac shadow remains enlarged. Left lung is clear. Increasing infiltrate is noted in the right lung base. No acute bony abnormality is noted.  IMPRESSION: Increasing right basilar infiltrate   Electronically Signed   By: Inez Catalina M.D.   On: 04/07/2014 00:18   Dg Chest Port 1 View  04/05/2014   CLINICAL DATA:  Orogastric tube placement  at bedside.  EXAM: PORTABLE CHEST - 1 VIEW  COMPARISON:  DG CHEST 1V PORT dated 04/05/2014; DG CHEST 1V PORT dated 03/16/2014; DG CHEST 1V PORT dated 12/01/2013; DG CHEST 1V PORT dated 11/29/2013  FINDINGS: Orogastric tube courses below the diaphragm into the stomach with its tip in the proximal body. Endotracheal tube tip is in the right mainstem bronchus. Cardiac silhouette enlarged. Interval improvement in the interstitial pulmonary edema since the examination earlier  same date. Mild atelectasis in the lung bases, right greater than left, likely related to suboptimal inspiration.  IMPRESSION: 1. OG tube tip in the proximal body of the stomach. 2. Endotracheal tube tip in the proximal right mainstem bronchus. This should be withdrawn 3-4 cm. 3. Improved interstitial pulmonary edema since the examination earlier today. 4. Mild bibasilar atelectasis, right greater than left, likely due to suboptimal inspiration. These results were called by telephone at the time of interpretation on 04/05/2014 at 4:36 PM to [facility], the nurse caring for the patient in the ICU, who verbally acknowledged these results.   Electronically Signed   By: Evangeline Dakin M.D.   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 BNP very high  P:   Trend BMP NS@100  Hold Lasix  GASTROINTESTINAL A:   Nutrition GERD P:   NPO TF Protonix as preadmission  HEMATOLOGIC  A:   Anemia of critical  illness No overt hemorrhage Therapeutic Heparin for ACS  - hgb < 8gm% without bleeding  P:  PRBC for hgb < 8gm% due to MI; give 1 unit 04/07/14 Trend CBC Heparin gtt per Pharmacy  INFECTIOUS A:   Possible pneumonia, PCT 4.28 P:   Cultures and antibiotics as above Rx levaquin  ENDOCRINE  A:   DM Hyperglycemia, partly steroid induced Possible adrenal insufficiency ( on chronic low dose Prednisone )  P:   SSI Add Lantus Hold Tradjenta, Metformin Already on higher dose Prednisone  NEUROLOGIC A:   Acute encephalopathy Delirium    - appears to be in pain. Cannot discern if abd or chest pain. Belly is soft.   P:   Goal RASS 0 to -1 Daily WUA Fent gtt Versed prn Monitor pain    GLOBAL 04/07/14: No family at bedside. Sister is HCPOA. She called and spoke to RN    The patient is critically ill with multiple organ systems failure and requires high complexity decision making for assessment and support, frequent evaluation and titration of therapies, application of advanced monitoring technologies and extensive interpretation of multiple databases.   Critical Care Time devoted to patient care services described in this note is  35  Minutes.  Dr. Brand Males, M.D., [facility].C.P Pulmonary and Critical Care Medicine Staff Physician [place] [facility] Pager: [phone], If no answer or between  15:00h - 7:00h: call [phone]  04/07/2014 9:24 AM

## 2014-04-07 NOTE — Progress Notes (Signed)
Respiratory therapy note-attempted wean, sedation turned off and patient is stil sleepy, will re attempt when awake.

## 2014-04-07 NOTE — Progress Notes (Signed)
ANTICOAGULATION CONSULT NOTE - Follow Up Consult  Pharmacy Consult for heparin Indication: chest pain/ACS   Labs:  Recent Labs  04/05/14 0727  04/05/14 1135 04/05/14 1532 04/05/14 2210 04/06/14 0240 04/07/14 0257  HGB 8.1*  --   --   --   --  8.1* 7.6*  HCT 29.5*  --   --   --   --  28.5* 26.8*  PLT 323  --   --   --   --  231 237  HEPARINUNFRC  --   --   --   --  0.50 0.47 0.71*  CREATININE 1.16*  --   --   --   --  1.18*  --   TROPONINI  --   < > 7.05* >20.00* >20.00*  --   --   < > = values in this interval not displayed.   Assessment: 73yo female now slightly supratherapeutic on heparin after two levels at goal; Hgb low but stable, however pt had two episodes of bloody urine, now reintubated w/ Foley and urine is clear.  Goal of Therapy:  Heparin level 0.3-0.7 units/ml   Plan:  Will decrease heparin gtt by ~1 unit/kg/hr to 1000 units/hr and check level in [place], PharmD, BCPS  04/07/2014,4:01 AM

## 2014-04-07 NOTE — Progress Notes (Signed)
ANTICOAGULATION CONSULT NOTE - Follow Up Consult  Pharmacy Consult for heparin Indication: chest pain/ACS  Allergies  Allergen Reactions  . Amlodipine Besy-Benazepril Hcl Other (See Comments)    Nervous/shakiness  . Statins Other (See Comments)    "can't take any of them; cramp me up" (12/06/2012)  . Tradjenta [Linagliptin] Other (See Comments)    Extreme muscle pains, chest and back pains  . Plavix [Clopidogrel] Rash    Pt states that the doctor told her that her rash was psoriasis and not related to plavix but she states the rash came from the plavix  . Raptiva [Efalizumab] Rash    Patient Measurements: Height: 5' 4.17" (163 cm) Weight: 181 lb 7 oz (82.3 kg) IBW/kg (Calculated) : 55.09 Heparin Dosing Weight: 73kg  Vital Signs: Temp: 100.2 F (37.9 C) (05/11 1800) Temp src: Core (Comment) (05/11 1300) BP: 105/87 mmHg (05/11 1800) Pulse Rate: 68 (05/11 1800)  Labs:  Recent Labs  04/05/14 0727  04/05/14 1135 04/05/14 1532  04/05/14 2210 04/06/14 0240 04/07/14 0257 04/07/14 1002 04/07/14 1824  HGB 8.1*  --   --   --   --   --  8.1* 7.6*  --   --   HCT 29.5*  --   --   --   --   --  28.5* 26.8*  --   --   PLT 323  --   --   --   --   --  231 237  --   --   HEPARINUNFRC  --   --   --   --   < > 0.50 0.47 0.71* 0.43 0.27*  CREATININE 1.16*  --   --   --   --   --  1.18* 1.50*  --   --   TROPONINI  --   < > 7.05* >20.00*  --  >20.00*  --   --   --   --   < > = values in this interval not displayed.  Estimated Creatinine Clearance: 34.3 ml/min (by C-G formula based on Cr of 1.5).   Medications:  Heparin @ 1000 units/hr  Assessment: 43 YOF who continues on heparin for ACS sx with a slightly SUBtherapeutic heparin level this evening (HL 0.27, goal of 0.3-0.7). No CBC since this morning. Hematuria noted overnight - none noted thus far today (per discussion with RN)  Goal of Therapy:  Heparin level 0.3-0.7 units/ml Monitor platelets by anticoagulation protocol: Yes    Plan:  1. Increase heparin drip rate to 1050 units/hr (10.5 ml/hr) 2. Will continue to monitor for any signs/symptoms of bleeding and will follow up with heparin level in 8 hours   Alycia Rossetti, PharmD, BCPS Clinical Pharmacist Pager: [phone] 04/07/2014 7:19 PM

## 2014-04-07 NOTE — Progress Notes (Signed)
RT note- weaned stopped, patient complaining of stomach/chest pain, MD at the bedside.

## 2014-04-07 NOTE — Progress Notes (Addendum)
On lunch break when another nurse came and stated that pt had self extubated herself.  When I arrived in room RT was placing pt on non-rebreather . Continous Fentanyl drip stopped. HR 94, RR15, BP 166/76, O2 100%.  [place] notified that pt self extubated.   Will continue to closely monitor pt.

## 2014-04-07 NOTE — Progress Notes (Signed)
RT Note- patient is now attempting to wean, still having periods of apnea/sleep, continue to monitor.

## 2014-04-07 NOTE — Progress Notes (Signed)
ANTICOAGULATION CONSULT NOTE - Follow Up Consult  Pharmacy Consult for heparin Indication: chest pain/ACS  Allergies  Allergen Reactions  . Amlodipine Besy-Benazepril Hcl Other (See Comments)    Nervous/shakiness  . Statins Other (See Comments)    "can't take any of them; cramp me up" (12/06/2012)  . Tradjenta [Linagliptin] Other (See Comments)    Extreme muscle pains, chest and back pains  . Plavix [Clopidogrel] Rash    Pt states that the doctor told her that her rash was psoriasis and not related to plavix but she states the rash came from the plavix  . Raptiva [Efalizumab] Rash    Patient Measurements: Height: 5' 4.17" (163 cm) Weight: 181 lb 7 oz (82.3 kg) IBW/kg (Calculated) : 55.09 Heparin Dosing Weight: 73kg  Vital Signs: Temp: 99.8 F (37.7 C) (05/11 1100) Temp src: Core (Comment) (05/11 0400) BP: 123/68 mmHg (05/11 1100) Pulse Rate: 71 (05/11 1100)  Labs:  Recent Labs  04/05/14 0727  04/05/14 1135 04/05/14 1532  04/05/14 2210 04/06/14 0240 04/07/14 0257 04/07/14 1002  HGB 8.1*  --   --   --   --   --  8.1* 7.6*  --   HCT 29.5*  --   --   --   --   --  28.5* 26.8*  --   PLT 323  --   --   --   --   --  231 237  --   HEPARINUNFRC  --   --   --   --   < > 0.50 0.47 0.71* 0.43  CREATININE 1.16*  --   --   --   --   --  1.18* 1.50*  --   TROPONINI  --   < > 7.05* >20.00*  --  >20.00*  --   --   --   < > = values in this interval not displayed.  Estimated Creatinine Clearance: 34.3 ml/min (by C-G formula based on Cr of 1.5).   Medications:  Heparin @ 1000 units/hr  Assessment: 88 YOF who was admitted 5/9 with chest pain and was started on heparin. Heparin level had been therapeutic and then became elevated last evening- noted there was a bit of a rise in SCr. She was having hematuria last evening but that is now resolved. Plts wnl and Hgb is low- getting a unit transfused. Heparin rate was decreased overnight for elevated level- a level drawn after the  decrease was drawn ~2 hours early but was therapeutic at 0.42 units/mL.  Goal of Therapy:  Heparin level 0.3-0.7 units/ml Monitor platelets by anticoagulation protocol: Yes   Plan:  1. Continue heparin at 1000 units/hr 2. Confirmatory level at 1800 3. daily heparin level and CBC 4. Follow for s/s bleeding 5. Follow for cath plans per cardiology  Simon Llamas D. Agam Tuohy, PharmD, BCPS Clinical Pharmacist Pager: [phone] 04/07/2014 11:50 AM

## 2014-04-07 NOTE — Progress Notes (Signed)
INITIAL NUTRITION ASSESSMENT  DOCUMENTATION CODES Per approved criteria  -Not Applicable   INTERVENTION:  Utilize 60M PEPuP Protocol: initiate TF via OGT with Vital AF 1.2 at 25 ml/h on day 1; on day 2, increase to goal rate of 55 ml/h (1320 ml per day) to provide 1584 kcals, 99 gm protein, 1071 ml free water daily.  NUTRITION DIAGNOSIS: Inadequate oral intake related to inability to eat as evidenced by NPO status.   Goal: Intake to meet >90% of estimated nutrition needs.  Monitor:  TF tolerance/adequacy, weight trend, labs, vent status.  Reason for Assessment: MD Consult for TF initiation and management.  73 y.o. female  Admitting Dx: Acute respiratory failure  ASSESSMENT: Patient is a 73 yo with COPD / reactive airway disease brought in to [facility] ED in respiratory distress on 5/9, intubated.  Patient is currently intubated on ventilator support MV: 7.5 L/min Temp (24hrs), Avg:98.6 F (37 C), Min:97.8 F (36.6 C), Max:99.8 F (37.7 C)   TF was initiated over the weekend, but was stopped when patient was extubated. Patient required re-intubation last night. Now with PNA and CHF. Received MD Consult for TF initiation and management. Nutrition focused physical exam completed.  No muscle or subcutaneous fat depletion noticed. Suspect weight fluctuations are related to fluid status with CHF.  Height: Ht Readings from Last 1 Encounters:  04/05/14 5' 4.17" (1.63 m)    Weight: Wt Readings from Last 1 Encounters:  04/07/14 181 lb 7 oz (82.3 kg)  04/05/14 173 lb 4.5 oz (ADMISSION WEIGHT)  Ideal Body Weight: 54.5 kg  % Ideal Body Weight: 144% using admission weight  Wt Readings from Last 10 Encounters:  04/07/14 181 lb 7 oz (82.3 kg)  04/02/14 173 lb 4.8 oz (78.608 kg)  03/21/14 173 lb 3.2 oz (78.563 kg)  03/21/14 173 lb 3.2 oz (78.563 kg)  12/31/13 170 lb (77.111 kg)  12/04/13 167 lb 14.4 oz (76.159 kg)  08/12/13 177 lb (80.287 kg)  07/30/13 177 lb (80.287 kg)  02/13/13  164 lb 8 oz (74.617 kg)  01/11/13 167 lb 1.7 oz (75.8 kg)    Usual Body Weight: 167 lb  % Usual Body Weight: 108%  BMI:  29.6 (using admission weight)  Estimated Nutritional Needs: Kcal: 1705 Protein: 80-100 gm Fluid: 1.7-2 L  Skin: no wounds  Diet Order:  NPO  EDUCATION NEEDS: -Education not appropriate at this time   Intake/Output Summary (Last 24 hours) at 04/07/14 1013 Last data filed at 04/07/14 0700  Gross per 24 hour  Intake 2872.15 ml  Output   1735 ml  Net 1137.15 ml    Last BM: None documented since admission    Labs:   Recent Labs Lab 04/05/14 0727 04/06/14 0240 04/07/14 0257  NA 141 137 140  K 4.5 4.6 4.6  CL 102 100 104  CO2 21 22 21   BUN 15 21 28*  CREATININE 1.16* 1.18* 1.50*  CALCIUM 8.4 8.8 8.6  MG  --   --  2.2  PHOS  --   --  3.7  GLUCOSE 341* 266* 206*    CBG (last 3)   Recent Labs  04/07/14 0142 04/07/14 0339 04/07/14 0728  GLUCAP 208* 203* 125*    Scheduled Meds: . antiseptic oral rinse  15 mL Mouth Rinse QID  . aspirin  325 mg Oral Daily  . brimonidine  1 drop Right Eye TID   And  . brinzolamide  1 drop Right Eye TID  . budesonide  0.25 mg  Nebulization BID  . chlorhexidine  15 mL Mouth Rinse BID  . cycloSPORINE  1 drop Both Eyes BID  . furosemide      . furosemide  40 mg Intravenous 3 times per day  . insulin aspart  0-15 Units Subcutaneous 6 times per day  . insulin glargine  10 Units Subcutaneous QHS  . latanoprost  1 drop Both Eyes QHS  . [START ON 04/08/2014] levofloxacin (LEVAQUIN) IV  750 mg Intravenous Q48H  . metoprolol  2.5 mg Intravenous 4 times per day  . pantoprazole (PROTONIX) IV  40 mg Intravenous Q24H  . predniSONE  40 mg Per Tube Q breakfast    Continuous Infusions: . sodium chloride 10 mL/hr at 04/07/14 0924  . fentaNYL infusion INTRAVENOUS 250 mcg/hr (04/07/14 0700)  . heparin 1,000 Units/hr (04/07/14 0700)    Past Medical History  Diagnosis Date  . COPD (chronic obstructive pulmonary  disease)   . Hypertension   . Asthma   . Psoriatic arthritis   . Psoriasis   . GERD (gastroesophageal reflux disease)   . Hiatal hernia   . Chronic sinus infection   . Heart murmur   . Blood transfusion ~ 1962    "11 units of blood"  . Ankylosing spondylitis   . Chronic back pain   . Dysrhythmia     "palpitations"  . Esophageal dilatation   . Candida esophagitis   . Cirrhosis of liver without mention of alcohol   . Hypercholesteremia   . Peripheral vascular disease   . Exertional dyspnea   . Pneumonia 10/07/11; 2006; 1980's  . Chronic bronchitis     "since childhood" (12/06/2012)  . Type II diabetes mellitus   . Iron deficiency anemia   . External bleeding hemorrhoids   . Sinus headache   . Arthritis     "psorasic"  . OA (osteoarthritis)   . DJD (degenerative joint disease) of knee     "both" (12/06/2012)  . Carpal tunnel syndrome on right   . Anxiety   . Barrett esophagus   . Non-healing ulcer of foot, secondary to diabetes and PVD 12/07/2012  . PVD (peripheral vascular disease) 12/07/2012  . Tobacco abuse 12/07/2012  . Critical lower limb ischemia, with ulcer Lt foot 12/07/2012  . Gout   . COPD (chronic obstructive pulmonary disease)   . Carotid artery disease     dopplers showed mod L ICA stenosis   . PAD (peripheral artery disease)   . History of nuclear stress test 11/2012    lexiscan; normal, low risk   . Dermatophytosis of nail [id number]  . Psoriasis   . GI bleed   . Diverticulosis     Past Surgical History  Procedure Laterality Date  . Nasal sinus surgery  09/29/2009  . Carpal tunnel release  2011    left hand  . Lipoma excision  2010    "left occipital area"  . Cataract extraction w/ intraocular lens implant  2009/2010    right/left  . Eye surgery    . Oophorectomy      'not sure which one"  . Appendectomy    . Angioplasty  12/10/2012    "LLE"; LSFA Diamondback orbital rotational arthrectomy, PTA , stenting with IDEV stent (left ABI improved from 0.35 to  0.75)  . Abdominal hysterectomy  1976  . Breast biopsy  1960's    "think he did both" (12/06/2012)  . 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 Esophagogastroduodenoscopy  2011  . Colonoscopy  2007    Hemorrhoids and Diverticulosis  . Esophagogastroduodenoscopy N/A 03/17/2014    Procedure: ESOPHAGOGASTRODUODENOSCOPY (EGD);  Surgeon: Irene Shipper, MD;  Location: [facility] ENDOSCOPY;  Service: Endoscopy;  Laterality: N/A;    Molli Barrows, RD, LDN, [place] Pager [phone] After Hours Pager [phone]

## 2014-04-07 NOTE — Procedures (Signed)
Intubation Procedure Note [name] [id number] 19-Feb-1941  Procedure: Intubation Indications: Respiratory insufficiency  Procedure Details Consent: Unable to obtain consent because of emergent medical necessity. Time Out: Verified patient identification, verified procedure, site/side was marked, verified correct patient position, special equipment/implants available, medications/allergies/relevent history reviewed, required imaging and test results available.  Performed  Medications used: etomidate and fentanyl and succinylcholine Equipment used: Glidescope Grade I View Correct placement confirmed by by auscultation, by CXR and ETCO2 monitor Tube secured at 24 cm at the lip  Evaluation Hemodynamic Status: BP stable throughout; O2 sats: transiently fell during during procedure Patient's Current Condition: stable Complications: No apparent complications Patient did tolerate procedure well. Chest X-ray ordered to verify placement.  CXR: tube position low-repostitioned.   Waynetta Pean, M.D. Pulmonary and Critical Care Medicine Call [place] with questions [phone] 04/07/2014

## 2014-04-07 NOTE — Progress Notes (Signed)
SUBJECTIVE: Ms. Janeway was seen and examined at bedside this morning.  Re-intubated overnight, failed weaning this morning.   Marland Kitchen antiseptic oral rinse  15 mL Mouth Rinse QID  . aspirin  325 mg Oral Daily  . brimonidine  1 drop Right Eye TID   And  . brinzolamide  1 drop Right Eye TID  . budesonide  0.25 mg Nebulization BID  . chlorhexidine  15 mL Mouth Rinse BID  . cycloSPORINE  1 drop Both Eyes BID  . furosemide      . furosemide  40 mg Intravenous 3 times per day  . insulin aspart  0-15 Units Subcutaneous 6 times per day  . insulin glargine  10 Units Subcutaneous QHS  . latanoprost  1 drop Both Eyes QHS  . [START ON 04/08/2014] levofloxacin (LEVAQUIN) IV  750 mg Intravenous Q48H  . metoprolol  2.5 mg Intravenous 4 times per day  . pantoprazole (PROTONIX) IV  40 mg Intravenous Q24H  . predniSONE  40 mg Per Tube Q breakfast   . sodium chloride 10 mL/hr at 04/07/14 0924  . fentaNYL infusion INTRAVENOUS 250 mcg/hr (04/07/14 0700)  . heparin 1,000 Units/hr (04/07/14 0700)   OBJECTIVE: Physical Exam: Filed Vitals:   04/07/14 0630 04/07/14 0645 04/07/14 0700 04/07/14 0715  BP: 112/67 108/66 113/75 110/65  Pulse: 67 67 69 66  Temp: 98.3 F (36.8 C) 98.3 F (36.8 C) 98.4 F (36.9 C) 98.3 F (36.8 C)  TempSrc:      Resp: 24 21 16 24   Height:      Weight:      SpO2: 100% 100% 100% 100%    Intake/Output Summary (Last 24 hours) at 04/07/14 1010 Last data filed at 04/07/14 0700  Gross per 24 hour  Intake 2872.15 ml  Output   1735 ml  Net 1137.15 ml   General: lying in bed, mechanically ventilated, follow commands and awake but sleepy HEENT: EOMI, OETT  Cardiovascular: RRR Lungs: coarse b/l breath sounds Abdomen: Soft, nontender, bowel sounds diminished, distended, endorses right sided abdominal pain Musculoskeletal: Moves all extremities, no edema, hyperpigmented, bilateral, lower extremities related to PVD. Left Midfoot amputation.   LABS: Basic Metabolic  Panel:  Recent Labs  04/06/14 0240 04/07/14 0257  NA 137 140  K 4.6 4.6  CL 100 104  CO2 22 21  GLUCOSE 266* 206*  BUN 21 28*  CREATININE 1.18* 1.50*  CALCIUM 8.8 8.6  MG  --  2.2  PHOS  --  3.7   Liver Function Tests:  Recent Labs  04/05/14 0727  AST 82*  ALT 52*  ALKPHOS 93  BILITOT 0.3  PROT 6.1  ALBUMIN 2.9*   CBC:  Recent Labs  04/05/14 0727 04/06/14 0240 04/07/14 0257  WBC 14.8* 12.4* 17.8*  NEUTROABS 9.8*  --   --   HGB 8.1* 8.1* 7.6*  HCT 29.5* 28.5* 26.8*  MCV 80.6 77.7* 78.6  PLT 323 231 237   Cardiac Enzymes:  Recent Labs  04/05/14 1135 04/05/14 1532 04/05/14 2210  TROPONINI 7.05* >20.00* >20.00*   RADIOLOGY: Dg Chest Port 1 View  04/05/2014   CLINICAL DATA:  Orogastric tube placement at bedside.  EXAM: PORTABLE CHEST - 1 VIEW  COMPARISON:  DG CHEST 1V PORT dated 04/05/2014; DG CHEST 1V PORT dated 03/16/2014; DG CHEST 1V PORT dated 12/01/2013; DG CHEST 1V PORT dated 11/29/2013  FINDINGS: Orogastric tube courses below the diaphragm into the stomach with its tip in the proximal body. Endotracheal tube tip is  in the right mainstem bronchus. Cardiac silhouette enlarged. Interval improvement in the interstitial pulmonary edema since the examination earlier same date. Mild atelectasis in the lung bases, right greater than left, likely related to suboptimal inspiration.  IMPRESSION: 1. OG tube tip in the proximal body of the stomach. 2. Endotracheal tube tip in the proximal right mainstem bronchus. This should be withdrawn 3-4 cm. 3. Improved interstitial pulmonary edema since the examination earlier today. 4. Mild bibasilar atelectasis, right greater than left, likely due to suboptimal inspiration. These results were called by telephone at the time of interpretation on 04/05/2014 at 4:36 PM to [facility], the nurse caring for the patient in the ICU, who verbally acknowledged these results.   Electronically Signed   By: Evangeline Dakin M.D.   On: 04/05/2014 16:38   Dg  Chest Port 1 View  04/05/2014   CLINICAL DATA:  Respiratory distress, endotracheal tube placed  EXAM: PORTABLE CHEST - 1 VIEW  COMPARISON:  DG CHEST 1V PORT dated 03/16/2014; DG CHEST 1V PORT dated 12/01/2013; DG CHEST 1V PORT dated 11/29/2013  FINDINGS: Endotracheal tube tip is 1 cm above the carinal. There is moderate cardiac enlargement. There is vascular congestion. There is moderate interstitial prominence suggesting pulmonary edema, a change from prior study.  IMPRESSION: Pulmonary edema.  Endotracheal tube tip 1 cm above carina.   Electronically Signed   By: Skipper Cliche M.D.   On: 04/05/2014 08:07   Dg Chest Portable 1 View  03/16/2014   CLINICAL DATA:  Shortness of breath. Panic attack. COPD. Exertional asthma.  EXAM: PORTABLE CHEST - 1 VIEW  COMPARISON:  12/01/2013  FINDINGS: Mild to moderate cardiomegaly is stable. Both lungs remain clear. No evidence of pleural effusion. Mild tortuosity of thoracic aorta appears stable.  IMPRESSION: Stable cardiomegaly.  No acute findings.   Electronically Signed   By: Earle Gell M.D.   On: 03/16/2014 12:05   Dg Abd Portable 1v  03/16/2014   CLINICAL DATA:  Abdominal distention and discomfort.  EXAM: PORTABLE ABDOMEN - 1 VIEW  COMPARISON:  Abdominal radiograph 12/04/2013.  FINDINGS: Gas and stool are seen scattered throughout the colon extending to the level of the distal rectum. No pathologic distension of small bowel is noted. No gross evidence of pneumoperitoneum.  IMPRESSION: 1.  Nonobstructive bowel gas pattern. 2. No pneumoperitoneum.   Electronically Signed   By: Vinnie Langton M.D.   On: 03/16/2014 17:04   ASSESSMENT AND PLAN:  Principal Problem:   Acute respiratory failure Active Problems:   DIABETES-TYPE 2   ANEMIA, IRON DEFICIENCY   HYPERTENSION   Acute exacerbation of chronic obstructive pulmonary disease (COPD)   GI bleeding   COPD (chronic obstructive pulmonary disease)   STEMI (ST elevation myocardial infarction)  STEMI--? Acute  anteroseptal MI : troponin's trended up on admission to >20 on 04/05/14. EKG changes with increased ST segment elevations lead in V2, V3 and V4 and today with t wave inversions in lateral leads that do not appear to be new. Echo yesterday with EF 35-40%.  Myoview scan 12/05/2012 was normal. No prior history of cardiac catheterization. Chest x-ray with pulmonary edema versus infectious process.  - Continue with heparin gtt - Needs cath once stable - Continue BB--on Lopressor 2.5mg  q6h - Patient has intolerance to statins due to severe cramps - Hemoglobin Goal for transfusion of 8, hx of GIB April 2015 - Now on lasix 40mg  TID - Holding home hydralazine in setting of hypotension on admission  Acute on chronic Combined  CHF: proBNP elevated at 9000 with congestion on CXR. Echo yesterday with EF 35-40% with moderately reduced systolic function and grade 1 DD with moderate hypokinesis of mid-distal inferior myocardium.  Also has an increased pericardial effusion (small to moderate). No prior history of cardiac catheterization.  Net +4.1L since admission with ~1.7L output yesterday  - started on IV lasix today, but caution with renal function worsening - Monitor electrolytes and replace as needed   Acute respiratory failure 2/2 ?PNA and/or pulm edema: re-intubated overnight.  ABG 7.33/44.4/183/23.6/100%.  Aspiration Pneumonia also considered. Was on vanc and zosyn and transitioned to IV Levofloxacin. On fentanyl.  - resp support per critical care  - Transitioned from solu-medrol to prednisone - Continue IV Levo - Blood cx x2 pending, NGTD  Acute on chronic anemia (iron deficiency) with hx of3 GIB in past. Was supposed to have further outpatient work up but unclear if that has been done. Hx of prior blood transfusion. Hb down to 7.6 today from 8.1 on admission. Baseline ~9.   -type and screen -may need to hold heparin gtt in setting of acute anemia -transfuse with goal Hb >8, transfuse 1 unit already  written for today  Hypertension: Initially hypotensive, now improved.  - now on BB, but still holding home hydralazine  DM2--Last A1C 7.2 in Feb 2014.  -SSI moderate and Lantus 10 qhs  Case discussed and patient seen with Dr. Aundra Dubin  Signed: Jerene Pitch, MD PGY-2, Internal Medicine Resident Pager: [phone]  04/07/2014,10:27 AM  Patient seen with resident, agree with the above note.  She has abdominal pain and tenderness today with no chest pain.   1. Acute respiratory failure: Suspect combination of AECOPD and acute on chronic systolic CHF with pulmonary edema.  She was extubated yesterday but then intubated again due to respiratory distress.  - Steroids per primary service.  - Agree with IV Lasix for volume overload/pulmonary edema.  2. Anemia: Would transfuse to keep hemoglobin >/= 8 in setting of ACS.  3. Acute on chronic systolic CHF: EF 72-09% with wall motion abnormalities.  Has history of prior presumed nonischemic cardiomyopathy and EF actually has not changed from the past.  Agree with IV Lasix for volume overload/pulmonary edema.  4. CAD: Troponin > 20 with initial anterior ST elevation, now resolving on today's ECG.  Continue ASA 81, heparin gtt, add statin.  Creatinine is rising, up to 1.5, and she is going to be getting Lasix for pulmonary edema today so concerned this could rise further.  As ECG is better and she is at least 2 days out from event without chest pain, would favor deferring cath today with rising creatinine.  If creatinine is trending down tomorrow, would consider cath tomorrow.  Also would cath if she develops chest pain.  5. Pericardial effusion: Mild to moderate, no tamponade.  Also noted on echo in 4/15.    Larey Dresser 04/07/2014

## 2014-04-07 NOTE — Progress Notes (Signed)
Over the last 15 minutes pt had become more distressed. PRN breathing treatment given by RT. HR and RR have increased. Pt visibly restless and repeating "this feels like when I had to call EMS to bring me here." Pt complains of feeling like she can't breath and something is in her throat.   Dr Presley Raddle on floor and in to assess pt. New orders received and followed. Pt placed on Bipap.  Pt tolerated Bipap for about 30 minutes before becoming restless/agigated and pulling at mask. HR increased to 130-140's.   Dr Presley Raddle in to re-evaluate pt. New orders received to intubate. RT at bedside and aware. Pt intubated at 0055 per MD.

## 2014-04-08 ENCOUNTER — Inpatient Hospital Stay (HOSPITAL_COMMUNITY): Payer: Medicare Other

## 2014-04-08 DIAGNOSIS — R143 Flatulence: Secondary | ICD-10-CM

## 2014-04-08 DIAGNOSIS — R141 Gas pain: Secondary | ICD-10-CM

## 2014-04-08 DIAGNOSIS — R142 Eructation: Secondary | ICD-10-CM

## 2014-04-08 LAB — CBC WITH DIFFERENTIAL/PLATELET
Basophils Absolute: 0 10*3/uL (ref 0.0–0.1)
Basophils Relative: 0 % (ref 0–1)
EOS ABS: 0 10*3/uL (ref 0.0–0.7)
Eosinophils Relative: 0 % (ref 0–5)
HEMATOCRIT: 30.9 % — AB (ref 36.0–46.0)
HEMOGLOBIN: 9.1 g/dL — AB (ref 12.0–15.0)
LYMPHS ABS: 0.6 10*3/uL — AB (ref 0.7–4.0)
Lymphocytes Relative: 5 % — ABNORMAL LOW (ref 12–46)
MCH: 23.4 pg — AB (ref 26.0–34.0)
MCHC: 29.4 g/dL — ABNORMAL LOW (ref 30.0–36.0)
MCV: 79.4 fL (ref 78.0–100.0)
Monocytes Absolute: 0.9 10*3/uL (ref 0.1–1.0)
Monocytes Relative: 7 % (ref 3–12)
Neutro Abs: 11.8 10*3/uL — ABNORMAL HIGH (ref 1.7–7.7)
Neutrophils Relative %: 88 % — ABNORMAL HIGH (ref 43–77)
Platelets: 236 10*3/uL (ref 150–400)
RBC: 3.89 MIL/uL (ref 3.87–5.11)
RDW: 28.1 % — ABNORMAL HIGH (ref 11.5–15.5)
WBC: 13.4 10*3/uL — ABNORMAL HIGH (ref 4.0–10.5)

## 2014-04-08 LAB — GLUCOSE, CAPILLARY
GLUCOSE-CAPILLARY: 143 mg/dL — AB (ref 70–99)
GLUCOSE-CAPILLARY: 86 mg/dL (ref 70–99)
Glucose-Capillary: 138 mg/dL — ABNORMAL HIGH (ref 70–99)
Glucose-Capillary: 182 mg/dL — ABNORMAL HIGH (ref 70–99)
Glucose-Capillary: 196 mg/dL — ABNORMAL HIGH (ref 70–99)
Glucose-Capillary: 88 mg/dL (ref 70–99)

## 2014-04-08 LAB — TYPE AND SCREEN
ABO/RH(D): A NEG
ANTIBODY SCREEN: NEGATIVE
Unit division: 0

## 2014-04-08 LAB — BASIC METABOLIC PANEL
BUN: 30 mg/dL — AB (ref 6–23)
CO2: 24 meq/L (ref 19–32)
CREATININE: 1.38 mg/dL — AB (ref 0.50–1.10)
Calcium: 9.2 mg/dL (ref 8.4–10.5)
Chloride: 104 mEq/L (ref 96–112)
GFR calc Af Amer: 42 mL/min — ABNORMAL LOW (ref >90)
GFR calc non Af Amer: 37 mL/min — ABNORMAL LOW (ref >90)
GLUCOSE: 179 mg/dL — AB (ref 70–99)
Potassium: 4.7 mEq/L (ref 3.7–5.3)
Sodium: 140 mEq/L (ref 137–147)

## 2014-04-08 LAB — MAGNESIUM: Magnesium: 1.9 mg/dL (ref 1.5–2.5)

## 2014-04-08 LAB — PHOSPHORUS
PHOSPHORUS: 3.9 mg/dL (ref 2.3–4.6)
Phosphorus: 4.7 mg/dL — ABNORMAL HIGH (ref 2.3–4.6)

## 2014-04-08 LAB — PRO B NATRIURETIC PEPTIDE
Pro B Natriuretic peptide (BNP): 13832 pg/mL — ABNORMAL HIGH (ref 0–125)
Pro B Natriuretic peptide (BNP): 21317 pg/mL — ABNORMAL HIGH (ref 0–125)

## 2014-04-08 LAB — PROTIME-INR
INR: 1.17 (ref 0.00–1.49)
Prothrombin Time: 14.7 seconds (ref 11.6–15.2)

## 2014-04-08 LAB — HEPARIN LEVEL (UNFRACTIONATED)
Heparin Unfractionated: 0.27 IU/mL — ABNORMAL LOW (ref 0.30–0.70)
Heparin Unfractionated: 0.44 IU/mL (ref 0.30–0.70)

## 2014-04-08 MED ORDER — PREDNISONE 20 MG PO TABS
40.0000 mg | ORAL_TABLET | Freq: Every day | ORAL | Status: DC
  Administered 2014-04-09 – 2014-04-12 (×4): 40 mg via ORAL
  Filled 2014-04-08 (×5): qty 2

## 2014-04-08 MED ORDER — SODIUM CHLORIDE 0.9 % IV SOLN: 250.0000 mL | INTRAVENOUS | Status: DC | PRN

## 2014-04-08 MED ORDER — ASPIRIN 81 MG PO CHEW
81.0000 mg | CHEWABLE_TABLET | ORAL | Status: AC
  Administered 2014-04-09: 81 mg via ORAL
  Filled 2014-04-08: qty 1

## 2014-04-08 MED ORDER — SODIUM CHLORIDE 0.9 % IJ SOLN: 3.0000 mL | INTRAMUSCULAR | Status: DC | PRN

## 2014-04-08 MED ORDER — SODIUM CHLORIDE 0.9 % IV SOLN
INTRAVENOUS | Status: DC
  Administered 2014-04-09: 10 mL/h via INTRAVENOUS

## 2014-04-08 MED ORDER — FUROSEMIDE 10 MG/ML IJ SOLN
40.0000 mg | Freq: Two times a day (BID) | INTRAMUSCULAR | Status: DC
  Administered 2014-04-08 – 2014-04-11 (×6): 40 mg via INTRAVENOUS
  Filled 2014-04-08 (×7): qty 4

## 2014-04-08 MED ORDER — INSULIN ASPART 100 UNIT/ML ~~LOC~~ SOLN
0.0000 [IU] | Freq: Three times a day (TID) | SUBCUTANEOUS | Status: DC
  Administered 2014-04-09: 5 [IU] via SUBCUTANEOUS
  Administered 2014-04-09 – 2014-04-10 (×2): 2 [IU] via SUBCUTANEOUS
  Administered 2014-04-10: 3 [IU] via SUBCUTANEOUS
  Administered 2014-04-10: 2 [IU] via SUBCUTANEOUS
  Administered 2014-04-10 – 2014-04-11 (×3): 5 [IU] via SUBCUTANEOUS
  Administered 2014-04-12: 11 [IU] via SUBCUTANEOUS
  Administered 2014-04-12: 5 [IU] via SUBCUTANEOUS
  Administered 2014-04-12: 3 [IU] via SUBCUTANEOUS
  Administered 2014-04-13: 8 [IU] via SUBCUTANEOUS
  Administered 2014-04-13 – 2014-04-14 (×3): 5 [IU] via SUBCUTANEOUS
  Administered 2014-04-14: 3 [IU] via SUBCUTANEOUS
  Administered 2014-04-14: 8 [IU] via SUBCUTANEOUS
  Administered 2014-04-15: 2 [IU] via SUBCUTANEOUS
  Administered 2014-04-15: 5 [IU] via SUBCUTANEOUS
  Administered 2014-04-15: 8 [IU] via SUBCUTANEOUS
  Administered 2014-04-15 – 2014-04-16 (×2): 3 [IU] via SUBCUTANEOUS
  Administered 2014-04-16: 2 [IU] via SUBCUTANEOUS
  Administered 2014-04-17: 3 [IU] via SUBCUTANEOUS
  Administered 2014-04-17 (×2): 5 [IU] via SUBCUTANEOUS
  Administered 2014-04-17: 8 [IU] via SUBCUTANEOUS
  Administered 2014-04-18 (×2): 3 [IU] via SUBCUTANEOUS
  Administered 2014-04-18: 15 [IU] via SUBCUTANEOUS
  Administered 2014-04-19: 3 [IU] via SUBCUTANEOUS
  Administered 2014-04-19: 5 [IU] via SUBCUTANEOUS
  Administered 2014-04-19: 2 [IU] via SUBCUTANEOUS
  Administered 2014-04-19 – 2014-04-20 (×2): 5 [IU] via SUBCUTANEOUS
  Administered 2014-04-20: 3 [IU] via SUBCUTANEOUS
  Administered 2014-04-20: 11 [IU] via SUBCUTANEOUS
  Administered 2014-04-20: 2 [IU] via SUBCUTANEOUS
  Administered 2014-04-21: 8 [IU] via SUBCUTANEOUS
  Administered 2014-04-21: 2 [IU] via SUBCUTANEOUS

## 2014-04-08 MED ORDER — LEVOFLOXACIN 750 MG PO TABS: 750.0000 mg | ORAL_TABLET | ORAL | Status: DC

## 2014-04-08 MED ORDER — SODIUM CHLORIDE 0.9 % IJ SOLN
3.0000 mL | Freq: Two times a day (BID) | INTRAMUSCULAR | Status: DC
  Administered 2014-04-08 – 2014-04-09 (×2): 3 mL via INTRAVENOUS

## 2014-04-08 MED ORDER — SENNOSIDES-DOCUSATE SODIUM 8.6-50 MG PO TABS
2.0000 | ORAL_TABLET | Freq: Two times a day (BID) | ORAL | Status: DC
  Administered 2014-04-08 – 2014-04-20 (×4): 2 via ORAL
  Filled 2014-04-08 (×23): qty 2

## 2014-04-08 MED ORDER — PANTOPRAZOLE SODIUM 40 MG PO TBEC
40.0000 mg | DELAYED_RELEASE_TABLET | Freq: Every day | ORAL | Status: DC
  Administered 2014-04-09 – 2014-04-21 (×13): 40 mg via ORAL
  Filled 2014-04-08 (×12): qty 1

## 2014-04-08 NOTE — Progress Notes (Signed)
PULMONARY / CRITICAL CARE MEDICINE  Name: [name] MRN: [id number] DOB: 1941/05/13    ADMISSION DATE:  04/05/2014 CONSULTATION DATE:  04/05/2014  REFERRING MD :  EDP PRIMARY SERVICE:  PCCM  CHIEF COMPLAINT:  Acute respiratory failure  BRIEF PATIENT DESCRIPTION: 74 yo with COPD / reactive airway disease brought in [facility] ED in respiratory distress on 5/9, intubated.    LINES / TUBES: OETT 5/9 >>>04/06/14, 04/07/14 >5/11 self extubated OGT 5/9 >>>out Foley 5/9 >>>  CULTURES: 5/9 Blood >>> 5/9 MRSA >>> neg   ANTIBIOTICS: Zosyn 5/9 >>>off Vancomycin 5/9 >>> 5/10 Levaquin 5/9 >>> ( 7 day)  SIGNIFICANT EVENTS / STUDIES:   04/06/14:   Ruled in for ACS. 5/11 self extubated 5/12 co abd pain   SUBJECTIVE/OVERNIGHT/INTERVAL HX 5/12: Self extubated. Co abd pain , tender abd.  Cath tomorrow.  Diuresed 7L    VITAL SIGNS: Temp:  [98.8 F (37.1 C)-100.3 F (37.9 C)] 99.2 F (37.3 C) (05/12 0900) Pulse Rate:  [59-95] 74 (05/12 0900) Resp:  [14-27] 18 (05/12 0900) BP: (101-156)/(54-87) 128/54 mmHg (05/12 0900) SpO2:  [94 %-100 %] 94 % (05/12 0900) FiO2 (%):  [40 %] 40 % (05/11 2310) Weight:  [80.1 kg (176 lb 9.4 oz)] 80.1 kg (176 lb 9.4 oz) (05/12 0400)  HEMODYNAMICS:   VENTILATOR SETTINGS: Vent Mode:  [-] PRVC FiO2 (%):  [40 %] 40 % Set Rate:  [24 bmp] 24 bmp Vt Set:  [450 mL] 450 mL PEEP:  [5 cmH20] 5 cmH20 Plateau Pressure:  [19 cmH20-20 cmH20] 20 cmH20 INTAKE / OUTPUT: Intake/Output     05/11 0701 - 05/12 0700 05/12 0701 - 05/13 0700   I.V. (mL/kg) 768.9 (9.6) 34 (0.4)   Blood 312.5    NG/GT 218.8    IV Piggyback  150   Total Intake(mL/kg) 1300.1 (16.2) 184 (2.3)   Urine (mL/kg/hr) 5300 (2.8) 550 (2.5)   Total Output 5300 550   Net -3999.9 -366          PHYSICAL EXAMINATION: General:  Awake and alert Neuro:  Intact, co abd pain HEENT:  No jvdOGT Cardiovascular:  RRR, no m/r/g Lungs:  Bilateral air entry, no rhonchi, diminished in bases Abdomen:   Soft, tender, bowel sounds diminished Musculoskeletal:  Moves all extremities, no edema. Left foot with half foot removed. Skin:  Intact  LABS:  PULMONARY  Recent Labs Lab 04/05/14 0843 04/05/14 1149  PHART 7.125* 7.331*  PCO2ART 72.2* 44.4  PO2ART 87.0 183.0*  HCO3 23.8 23.6  TCO2 26 25  O2SAT 92.0 100.0    CBC  Recent Labs Lab 04/06/14 0240 04/07/14 0257 04/08/14 0423  HGB 8.1* 7.6* 9.1*  HCT 28.5* 26.8* 30.9*  WBC 12.4* 17.8* 13.4*  PLT 231 237 236    COAGULATION  Recent Labs Lab 04/08/14 0423  INR 1.17    CARDIAC    Recent Labs Lab 04/05/14 0742 04/05/14 1135 04/05/14 1532 04/05/14 2210  TROPONINI <0.30 7.05* >20.00* >20.00*    Recent Labs Lab 04/05/14 0742 04/08/14 0423  PROBNP 9378.0* 13832.0*     CHEMISTRY  Recent Labs Lab 04/05/14 0727 04/06/14 0240 04/07/14 0257 04/07/14 1002 04/07/14 2248 04/08/14 0423  NA 141 137 140  --   --  140  K 4.5 4.6 4.6  --   --  4.7  CL 102 100 104  --   --  104  CO2 21 22 21   --   --  24  GLUCOSE 341* 266* 206*  --   --  179*  BUN 15 21 28*  --   --  30*  CREATININE 1.16* 1.18* 1.50*  --   --  1.38*  CALCIUM 8.4 8.8 8.6  --   --  9.2  MG  --   --  2.2  --   --  1.9  PHOS  --   --  3.7 3.1 3.9 4.7*   Estimated Creatinine Clearance: 36.8 ml/min (by C-G formula based on Cr of 1.38).   LIVER  Recent Labs Lab 04/05/14 0727 04/08/14 0423  AST 82*  --   ALT 52*  --   ALKPHOS 93  --   BILITOT 0.3  --   PROT 6.1  --   ALBUMIN 2.9*  --   INR  --  1.17     INFECTIOUS  Recent Labs Lab 04/05/14 1135  04/05/14 2210 04/06/14 0240 04/06/14 0930 04/07/14 0257 04/07/14 0742  LATICACIDVEN 4.1*  < > 4.1*  --  1.5  --  1.1  PROCALCITON 0.88  --   --  4.28  --  3.29  --   < > = values in this interval not displayed.   ENDOCRINE CBG (last 3)   Recent Labs  04/07/14 2351 04/08/14 0414 04/08/14 0759  GLUCAP 196* 182* 143*         IMAGING x48h  Dg Chest Port 1  View  04/08/2014   CLINICAL DATA:  Shortness of Breath  EXAM: PORTABLE CHEST - 1 VIEW  COMPARISON:  Apr 07, 2014  FINDINGS: Endotracheal tube is no longer appreciable. No pneumothorax. There are bilateral effusions with interstitial edema and cardiomegaly. There is pulmonary venous hypertension. There is no appreciable airspace consolidation. No adenopathy.  IMPRESSION: Congestive heart failure. Endotracheal tube no longer appreciable. No pneumothorax.   Electronically Signed   By: Lowella Grip M.D.   On: 04/08/2014 07:03   Dg Chest Port 1 View  04/07/2014   CLINICAL DATA:  Evaluate endotracheal tube position  EXAM: PORTABLE CHEST - 1 VIEW  COMPARISON:  Portable chest x-ray of 04/07/2014  FINDINGS: The tip of the endotracheal tube is approximately 2.8 cm above the carina. There are more prominent and coarse lung markings bilaterally some of which may be due to atelectasis. However mild congestion and small effusions cannot be excluded. Cardiomegaly is stable.  IMPRESSION: 1. Endotracheal tube tip 2.8 cm above the carina. 2. Suspect mild pulmonary vascular congestion and and possibly small effusions with basilar atelectasis.   Electronically Signed   By: Ivar Drape M.D.   On: 04/07/2014 07:21   Dg Chest Port 1 View  04/07/2014   CLINICAL DATA:  Continued respiratory distress status post intubation  EXAM: PORTABLE CHEST - 1 VIEW  COMPARISON:  Film from earlier in the same day  FINDINGS: Cardiac shadow remains enlarged. Persistent right basilar infiltrate is seen. The left lung remains clear. An endotracheal tube is been placed and is 12 mm above the carina and should be withdrawn 2-3 cm. No bony abnormality is noted.  IMPRESSION: Endotracheal tube just above the carina. This should be withdrawn 2-3 cm.  Persistent right basilar changes.  These results were called by telephone at the time of interpretation on 04/07/2014 at 1:00 AM to [facility], the pts nurse, who verbally acknowledged these results.    Electronically Signed   By: Inez Catalina M.D.   On: 04/07/2014 01:02   Dg Chest Port 1 View  04/07/2014   CLINICAL DATA:  Shortness of breath  EXAM: PORTABLE CHEST -  1 VIEW  COMPARISON:  04/05/2014  FINDINGS: The endotracheal tube and nasogastric catheter been removed in the interval. The cardiac shadow remains enlarged. Left lung is clear. Increasing infiltrate is noted in the right lung base. No acute bony abnormality is noted.  IMPRESSION: Increasing right basilar infiltrate   Electronically Signed   By: Inez Catalina M.D.   On: 04/07/2014 00:18   Dg Abd Portable 1v  04/07/2014   ADDENDUM REPORT: 04/07/2014 15:10  ADDENDUM: I just received a telephone call from the nurse. The NG tube is going to be used as the feeding tube and no transpyloric tube has been placed. As below, the NG tube tip overlies the mid stomach.   Electronically Signed   By: Misty Stanley M.D.   On: 04/07/2014 15:10   04/07/2014   CLINICAL DATA:  Feeding tube placement.  EXAM: PORTABLE ABDOMEN - 1 VIEW  COMPARISON:  03/16/14  FINDINGS: 1331 hrs. No feeding tube is visible on this exam. An NG tube tip overlies the mid stomach. The bowel gas pattern is nonspecific.  IMPRESSION: No feeding tube visualized. If the feeding tube has not been removed, consider imaging of the chest to assess placement.  Electronically Signed: By: Misty Stanley M.D. On: 04/07/2014 14:04    Intake/Output Summary (Last 24 hours) at 04/08/14 0955 Last data filed at 04/08/14 0900  Gross per 24 hour  Intake 1464.14 ml  Output   5700 ml  Net -4235.86 ml   Intake/Output     05/11 0701 - 05/12 0700 05/12 0701 - 05/13 0700   I.V. (mL/kg) 768.9 (9.6) 34 (0.4)   Blood 312.5    NG/GT 218.8    IV Piggyback  150   Total Intake(mL/kg) 1300.1 (16.2) 184 (2.3)   Urine (mL/kg/hr) 5300 (2.8) 550 (2.4)   Total Output 5300 550   Net -3999.9 -366            ASSESSMENT / PLAN:  PULMONARY A:   Acute respiratory failure  - Possible pneumonia v Possibly  pulmonary edema v both   - reintubated 04/07/14             - self extubated 5/11  P:   Trend ABG / CXR Albuterol / Atrovent Prednisone 40  CARDIOVASCULAR A:  Baseline left common iliac artery aneursym s- seen CT abd 2012 STEMI Acute on Chronic systolic / diastolic heart failure  P:  Cardiology following Goal MAP>60 Cardiac cath per cards  diuresis(5/11 neg 4.2 litre's)(total 7 litre's x 48 hrs) ASA Metoprolol Statins - intolerant Hold Hydralazine   RENAL A:   AKI   P:   Trend BMP NS@ kv0, change to saline lock Diuresis as tolerated  GASTROINTESTINAL A:   Nutrition GERD abd pain  - EGD 03/17/14 by Rikki Spearing. Normal exam. Known diverticulosis and non-obst renal calculii in 2012 CT abd. Colonoscopy pending and scheduled opd 04/16/14    P:   Protonix as preadmission May need CT abd but will hold off due to reduced GFR and upcoming cardiac cath Renal US and Abd [place]; will get Inform Dr Henrene Pastor of admission  HEMATOLOGIC   A:   Anemia of critical illness No overt hemorrhage Therapeutic Heparin for ACS  - hgb > 8gm% without bleeding (sp PRBC 1 unit 04/08/14)  P:  PRBC for hgb < 8gm%  Trend CBC Heparin gtt per Pharmacy  INFECTIOUS A:   Possible pneumonia, PCT 4.28 P:   Cultures and antibiotics as above Rx levaquin; total 7 days  ENDOCRINE  CBG (last 3)   Recent Labs  04/07/14 2351 04/08/14 0414 04/08/14 0759  GLUCAP 196* 182* 143*     A:   DM Hyperglycemia, partly steroid induced Possible adrenal insufficiency ( on chronic low dose Prednisone )  P:   SSI Lantus Hold Tradjenta, Metformin Already on higher dose Prednisone  NEUROLOGIC A:   Acute encephalopathy(resolved 5/12) Delirium    - normal mental status     P:   Monitor mental status   GLOBAL 5/12 self extubated, nad, co abd pain. Move to SDU; will ask tRH to assume primary. Will need opd pulmonary followup; set for 3 weeks with 04/29/14 at 15.30 with Dr Chase Caller . Will need  copd status sorted out    [facility] Pager [phone] till 3 pm If no answer page [phone] 04/08/2014, 10:06 AM   STAFF NOTE All plan items in bold are staff MD comments. Rest per np.    Dr. Brand Males, M.D., [facility].C.P Pulmonary and Critical Care Medicine Staff Physician [facility] Pager: [phone], If no answer or between  15:00h - 7:00h: call [phone]  04/08/2014 2:07 PM

## 2014-04-08 NOTE — Progress Notes (Signed)
SUBJECTIVE: [name] was seen and examined at bedside this morning.  Self-extubated overnight.  Marland Kitchen antiseptic oral rinse  15 mL Mouth Rinse QID  . aspirin  325 mg Oral Daily  . atorvastatin  20 mg Per Tube q1800  . brimonidine  1 drop Right Eye TID   And  . brinzolamide  1 drop Right Eye TID  . budesonide  0.25 mg Nebulization BID  . chlorhexidine  15 mL Mouth Rinse BID  . cycloSPORINE  1 drop Both Eyes BID  . furosemide  40 mg Intravenous 3 times per day  . insulin aspart  0-15 Units Subcutaneous 6 times per day  . insulin glargine  10 Units Subcutaneous QHS  . ipratropium-albuterol  3 mL Nebulization Q6H  . latanoprost  1 drop Both Eyes QHS  . levofloxacin (LEVAQUIN) IV  750 mg Intravenous Q48H  . metoprolol  2.5 mg Intravenous 4 times per day  . pantoprazole (PROTONIX) IV  40 mg Intravenous Q24H  . predniSONE  40 mg Per Tube Q breakfast   . sodium chloride 10 mL/hr at 04/07/14 0924  . feeding supplement (VITAL AF 1.2 CAL) 1,000 mL (04/07/14 2345)  . heparin 1,200 Units/hr (04/08/14 0800)   OBJECTIVE: Physical Exam: Filed Vitals:   04/08/14 0600 04/08/14 0700 04/08/14 0734 04/08/14 0800  BP: 123/54 122/60  127/66  Pulse: 76 79  77  Temp: 99.4 F (37.4 C) 99.3 F (37.4 C)  99.3 F (37.4 C)  TempSrc:      Resp: 23 17  22   Height:      Weight:      SpO2: 99% 97% 100% 99%    Intake/Output Summary (Last 24 hours) at 04/08/14 0839 Last data filed at 04/08/14 0800  Gross per 24 hour  Intake 1312.14 ml  Output   5450 ml  Net -4137.86 ml   General: lying in bed HEENT: EOMI Neck: JVP 10 cm Cardiovascular: RRR Lungs: b/l rales, mild expiratory wheeze Abdomen: Soft, tenderness to palpation right side, distended, voluntary guarding, +bs Musculoskeletal: Moves all extremities, no edema, hyperpigmented, bilateral, lower extremities related to PVD. Left Midfoot amputation.  Neuro: alert, awake, and oriented  LABS: Basic Metabolic Panel:  Recent Labs   04/07/14 0257  04/07/14 2248 04/08/14 0423  NA 140  --   --  140  K 4.6  --   --  4.7  CL 104  --   --  104  CO2 21  --   --  24  GLUCOSE 206*  --   --  179*  BUN 28*  --   --  30*  CREATININE 1.50*  --   --  1.38*  CALCIUM 8.6  --   --  9.2  MG 2.2  --   --  1.9  PHOS 3.7  < > 3.9 4.7*  < > = values in this interval not displayed. CBC:  Recent Labs  04/07/14 0257 04/08/14 0423  WBC 17.8* 13.4*  NEUTROABS  --  11.8*  HGB 7.6* 9.1*  HCT 26.8* 30.9*  MCV 78.6 79.4  PLT 237 236   Cardiac Enzymes:  Recent Labs  04/05/14 1135 04/05/14 1532 04/05/14 2210  TROPONINI 7.05* >20.00* >20.00*   RADIOLOGY: Dg Chest Port 1 View  04/05/2014   CLINICAL DATA:  Orogastric tube placement at bedside.  EXAM: PORTABLE CHEST - 1 VIEW  COMPARISON:  DG CHEST 1V PORT dated 04/05/2014; DG CHEST 1V PORT dated 03/16/2014; DG CHEST 1V PORT dated 12/01/2013; Darletta Moll  CHEST 1V PORT dated 11/29/2013  FINDINGS: Orogastric tube courses below the diaphragm into the stomach with its tip in the proximal body. Endotracheal tube tip is in the right mainstem bronchus. Cardiac silhouette enlarged. Interval improvement in the interstitial pulmonary edema since the examination earlier same date. Mild atelectasis in the lung bases, right greater than left, likely related to suboptimal inspiration.  IMPRESSION: 1. OG tube tip in the proximal body of the stomach. 2. Endotracheal tube tip in the proximal right mainstem bronchus. This should be withdrawn 3-4 cm. 3. Improved interstitial pulmonary edema since the examination earlier today. 4. Mild bibasilar atelectasis, right greater than left, likely due to suboptimal inspiration. These results were called by telephone at the time of interpretation on 04/05/2014 at 4:36 PM to [facility], the nurse caring for the patient in the ICU, who verbally acknowledged these results.   Electronically Signed   By: Evangeline Dakin M.D.   On: 04/05/2014 16:38   Dg Chest Port 1 View  04/05/2014   CLINICAL  DATA:  Respiratory distress, endotracheal tube placed  EXAM: PORTABLE CHEST - 1 VIEW  COMPARISON:  DG CHEST 1V PORT dated 03/16/2014; DG CHEST 1V PORT dated 12/01/2013; DG CHEST 1V PORT dated 11/29/2013  FINDINGS: Endotracheal tube tip is 1 cm above the carinal. There is moderate cardiac enlargement. There is vascular congestion. There is moderate interstitial prominence suggesting pulmonary edema, a change from prior study.  IMPRESSION: Pulmonary edema.  Endotracheal tube tip 1 cm above carina.   Electronically Signed   By: Skipper Cliche M.D.   On: 04/05/2014 08:07   Dg Chest Portable 1 View  03/16/2014   CLINICAL DATA:  Shortness of breath. Panic attack. COPD. Exertional asthma.  EXAM: PORTABLE CHEST - 1 VIEW  COMPARISON:  12/01/2013  FINDINGS: Mild to moderate cardiomegaly is stable. Both lungs remain clear. No evidence of pleural effusion. Mild tortuosity of thoracic aorta appears stable.  IMPRESSION: Stable cardiomegaly.  No acute findings.   Electronically Signed   By: Earle Gell M.D.   On: 03/16/2014 12:05   Dg Abd Portable 1v  03/16/2014   CLINICAL DATA:  Abdominal distention and discomfort.  EXAM: PORTABLE ABDOMEN - 1 VIEW  COMPARISON:  Abdominal radiograph 12/04/2013.  FINDINGS: Gas and stool are seen scattered throughout the colon extending to the level of the distal rectum. No pathologic distension of small bowel is noted. No gross evidence of pneumoperitoneum.  IMPRESSION: 1.  Nonobstructive bowel gas pattern. 2. No pneumoperitoneum.   Electronically Signed   By: Vinnie Langton M.D.   On: 03/16/2014 17:04   ASSESSMENT AND PLAN:  Principal Problem:   Acute respiratory failure Active Problems:   DIABETES-TYPE 2   ANEMIA, IRON DEFICIENCY   HYPERTENSION   Acute exacerbation of chronic obstructive pulmonary disease (COPD)   GI bleeding   COPD (chronic obstructive pulmonary disease)   STEMI (ST elevation myocardial infarction)  STEMI--? Acute anteroseptal MI : troponin's trended up on  admission to >20 on 04/05/14. EKG changes with increased ST segment elevations lead in V2, V3 and V4 with t wave inversions in lateral leads that do not appear to be new. Echo 5/10 with EF 35-40%.  Myoview scan 12/05/2012 was normal. No prior history of cardiac catheterization. Chest x-ray with pulmonary edema versus infectious process.  - Continue with heparin gtt - ?Cath in AM - Continue BB--on Lopressor 2.5mg  q6h-->transition to PO today - Patient has intolerance to statins due to severe cramps,but tolerating lipitor 20mg  for now -  Hemoglobin Goal for transfusion of 8, hx of GIB April 2015 - Continue lasix 40mg  TID - Holding home hydralazine in setting of hypotension on admission  Acute on chronic combined CHF: proBNP elevated at 9000 with congestion on CXR. Echo yesterday with EF 35-40% with moderately reduced systolic function and grade 1 DD with moderate hypokinesis of mid-distal inferior myocardium.  Also has an increased pericardial effusion (small to moderate). No prior history of cardiac catheterization.  Net -117.59mL since admission with ~5.3L output yesterday. Down 5 pounds from yesterday.  - Continue IV lasix with improving renal function - Monitor electrolytes and replace as needed   Acute respiratory failure 2/2 ?PNA and/or pulm edema: self-extubated overnight.  Currently on IV Levofloxacin.  - resp support per critical care  - diuresing well on lasix, 5.3L output yesterday, currently net neg 117.52mL - Continue Levo - Blood cx x2 pending, NGTD, respiratory culture pending  Acute on chronic anemia (iron deficiency) with hx of3 GIB in past. Was supposed to have further outpatient work up but unclear if that has been done. Hx of prior blood transfusion. Hb improved to 9.1 this morning. S/p 1 unit PRBC transfusion yesterday. Baseline ~9.   -continue to monitor -transfuse with goal Hb >8  Acute on chronic renal failure, CKD 2--Cr trending down today, 1.38 from 1.5 yesterday. Baseline  <1 -continue to trend -improved with diuresis  Hypertension: Initially hypotensive, now improved.  - continue BB, but still holding home hydralazine  DM2--Last A1C 7.2 in Feb 2014.  -SSI moderate and Lantus 10 qhs  Case discussed and patient seen with Dr. Aundra Dubin  Signed: Jerene Pitch, MD PGY-2, Internal Medicine Resident Pager: [phone]  04/08/2014,8:39 AM  Patient seen with resident, agree with the above note.  1. Acute respiratory failure: Suspect combination of AECOPD and acute on chronic systolic CHF with pulmonary edema. She is now extubated and seems to be doing well so far. 2. Anemia: Would transfuse to keep hemoglobin >/= 8 in setting of ACS. Hemoglobin now > 9.  3. Acute on chronic systolic CHF: EF 44-01% with wall motion abnormalities. Has history of prior presumed nonischemic cardiomyopathy and EF actually has not changed from the past. Diuresed well yesterday, still some volume overload.  Continue IV Lasix 40 mg IV bid today.  If she can get po meds today, would transition from IV metoprolol to Coreg 3.125 mg bid.  4. CAD: Troponin > 20 with initial anterior ST elevation, resolved on yesterday's ECG. Continue ASA 81, heparin gtt, statin. Creatinine improving.  She has significant abdominal pain today and is several days post-event.  Will allow abdominal pain to be addressed today, plan cath tomorrow if stable.  5. Pericardial effusion: Mild to moderate, no tamponade. Also noted on echo in 4/15.  6. Abdominal pain: Tender epigastrium.  Workup per primary team.   Larey Dresser 04/08/2014 10:53 AM

## 2014-04-08 NOTE — Progress Notes (Signed)
eLink Physician-Brief Progress Note Patient Name: [name] DOB: May 03, 1941 MRN: [id number]  Date of Service  04/08/2014   HPI/Events of Note  Call from bedside nurse that patient had self-extubated earlier.  Currently alert, talking, in NAD.  Sats of 100% on NRB.  HD stable.   eICU Interventions  Plan: No indication for re-intubation Monitor      [name] 04/08/2014, 12:17 AM

## 2014-04-08 NOTE — Progress Notes (Signed)
ANTICOAGULATION CONSULT NOTE - Follow Up Consult  Pharmacy Consult for heparin Indication: chest pain/ACS  Labs:  Recent Labs  04/05/14 1135 04/05/14 1532  04/05/14 2210  04/06/14 0240 04/07/14 0257 04/07/14 1002 04/07/14 1824 04/08/14 0423  HGB  --   --   --   --   < > 8.1* 7.6*  --   --  9.1*  HCT  --   --   --   --   --  28.5* 26.8*  --   --  30.9*  PLT  --   --   --   --   --  231 237  --   --  236  LABPROT  --   --   --   --   --   --   --   --   --  14.7  INR  --   --   --   --   --   --   --   --   --  1.17  HEPARINUNFRC  --   --   < > 0.50  --  0.47 0.71* 0.43 0.27* 0.27*  CREATININE  --   --   --   --   --  1.18* 1.50*  --   --  1.38*  TROPONINI 7.05* >20.00*  --  >20.00*  --   --   --   --   --   --   < > = values in this interval not displayed.   Assessment: 73yo female remains subtherapeutic on heparin with no change in level despite rate increase.  Goal of Therapy:  Heparin level 0.3-0.7 units/ml   Plan:  Will increase heparin gtt by 2 units/kg/hr to 1200 units/hr and check level in [place], PharmD, BCPS  04/08/2014,7:49 AM

## 2014-04-08 NOTE — Progress Notes (Signed)
150 cc of Fentanyl wasted in sink with York Pellant, RN.

## 2014-04-08 NOTE — Progress Notes (Signed)
ANTICOAGULATION CONSULT NOTE - Follow Up Consult  Pharmacy Consult for heparin Indication: chest pain/ACS  Labs:  Recent Labs  04/05/14 2210  04/06/14 0240 04/07/14 0257  04/07/14 1824 04/08/14 0423 04/08/14 1834  HGB  --   < > 8.1* 7.6*  --   --  9.1*  --   HCT  --   --  28.5* 26.8*  --   --  30.9*  --   PLT  --   --  231 237  --   --  236  --   LABPROT  --   --   --   --   --   --  14.7  --   INR  --   --   --   --   --   --  1.17  --   HEPARINUNFRC 0.50  --  0.47 0.71*  < > 0.27* 0.27* 0.44  CREATININE  --   --  1.18* 1.50*  --   --  1.38*  --   TROPONINI >20.00*  --   --   --   --   --   --   --   < > = values in this interval not displayed.   Assessment: 73yo female on IV heparin for ACS. Heparin level is therapeutic at current rate of 1200 units/hr.   Goal of Therapy:  Heparin level 0.3-0.7 units/ml   Plan:  Continue heparin at 1200 untis/hr and follow-up am level.   Sloan Leiter, PharmD, BCPS Clinical Pharmacist [phone] 04/08/2014,7:39 PM

## 2014-04-08 NOTE — Progress Notes (Signed)
Received referral for [employer] services on behalf of [place] [facility]. Came to room. However, patient undergoing an ultrasound per nursing. Will come back at later time. Marthenia Rolling, MSN-Ed, RN,BSN- [facility] [phone]

## 2014-04-08 NOTE — Progress Notes (Signed)
Wasted 20 cc versed in sink with Company secretary.

## 2014-04-09 ENCOUNTER — Inpatient Hospital Stay (HOSPITAL_COMMUNITY): Payer: Medicare Other

## 2014-04-09 ENCOUNTER — Telehealth: Payer: Self-pay

## 2014-04-09 ENCOUNTER — Encounter (HOSPITAL_COMMUNITY)
Admission: EM | Disposition: A | Discharge status: Home Health | Payer: Self-pay | Source: Home / Self Care | Attending: Cardiology

## 2014-04-09 DIAGNOSIS — N179 Acute kidney failure, unspecified: Secondary | ICD-10-CM

## 2014-04-09 DIAGNOSIS — N189 Chronic kidney disease, unspecified: Secondary | ICD-10-CM

## 2014-04-09 DIAGNOSIS — R109 Unspecified abdominal pain: Secondary | ICD-10-CM

## 2014-04-09 DIAGNOSIS — K5900 Constipation, unspecified: Secondary | ICD-10-CM | POA: Diagnosis present

## 2014-04-09 DIAGNOSIS — K922 Gastrointestinal hemorrhage, unspecified: Secondary | ICD-10-CM

## 2014-04-09 DIAGNOSIS — I2510 Atherosclerotic heart disease of native coronary artery without angina pectoris: Secondary | ICD-10-CM

## 2014-04-09 DIAGNOSIS — I5042 Chronic combined systolic (congestive) and diastolic (congestive) heart failure: Secondary | ICD-10-CM

## 2014-04-09 DIAGNOSIS — D509 Iron deficiency anemia, unspecified: Secondary | ICD-10-CM

## 2014-04-09 DIAGNOSIS — I10 Essential (primary) hypertension: Secondary | ICD-10-CM

## 2014-04-09 DIAGNOSIS — J449 Chronic obstructive pulmonary disease, unspecified: Secondary | ICD-10-CM

## 2014-04-09 HISTORY — PX: LEFT HEART CATHETERIZATION WITH CORONARY ANGIOGRAM: SHX5451

## 2014-04-09 LAB — CBC WITH DIFFERENTIAL/PLATELET
Basophils Absolute: 0 K/uL (ref 0.0–0.1)
Basophils Relative: 0 % (ref 0–1)
Eosinophils Absolute: 0.1 K/uL (ref 0.0–0.7)
Eosinophils Relative: 1 % (ref 0–5)
HCT: 32.3 % — ABNORMAL LOW (ref 36.0–46.0)
Hemoglobin: 9.4 g/dL — ABNORMAL LOW (ref 12.0–15.0)
Lymphocytes Relative: 15 % (ref 12–46)
Lymphs Abs: 1.8 K/uL (ref 0.7–4.0)
MCH: 22.8 pg — ABNORMAL LOW (ref 26.0–34.0)
MCHC: 29.1 g/dL — ABNORMAL LOW (ref 30.0–36.0)
MCV: 78.4 fL (ref 78.0–100.0)
Monocytes Absolute: 1.2 K/uL — ABNORMAL HIGH (ref 0.1–1.0)
Monocytes Relative: 10 % (ref 3–12)
Neutro Abs: 8.7 K/uL — ABNORMAL HIGH (ref 1.7–7.7)
Neutrophils Relative %: 74 % (ref 43–77)
Platelets: 253 K/uL (ref 150–400)
RBC: 4.12 MIL/uL (ref 3.87–5.11)
RDW: 27.9 % — ABNORMAL HIGH (ref 11.5–15.5)
WBC: 11.8 K/uL — ABNORMAL HIGH (ref 4.0–10.5)

## 2014-04-09 LAB — BASIC METABOLIC PANEL
BUN: 29 mg/dL — ABNORMAL HIGH (ref 6–23)
CO2: 29 meq/L (ref 19–32)
Calcium: 9.8 mg/dL (ref 8.4–10.5)
Chloride: 102 mEq/L (ref 96–112)
Creatinine, Ser: 1.33 mg/dL — ABNORMAL HIGH (ref 0.50–1.10)
GFR calc non Af Amer: 38 mL/min — ABNORMAL LOW (ref >90)
GFR, EST AFRICAN AMERICAN: 44 mL/min — AB (ref >90)
Glucose, Bld: 105 mg/dL — ABNORMAL HIGH (ref 70–99)
Potassium: 4.1 mEq/L (ref 3.7–5.3)
SODIUM: 145 meq/L (ref 137–147)

## 2014-04-09 LAB — CULTURE, RESPIRATORY

## 2014-04-09 LAB — GLUCOSE, CAPILLARY
GLUCOSE-CAPILLARY: 101 mg/dL — AB (ref 70–99)
Glucose-Capillary: 109 mg/dL — ABNORMAL HIGH (ref 70–99)
Glucose-Capillary: 113 mg/dL — ABNORMAL HIGH (ref 70–99)
Glucose-Capillary: 138 mg/dL — ABNORMAL HIGH (ref 70–99)
Glucose-Capillary: 247 mg/dL — ABNORMAL HIGH (ref 70–99)

## 2014-04-09 LAB — AMYLASE: Amylase: 79 U/L (ref 0–105)

## 2014-04-09 LAB — CULTURE, RESPIRATORY W GRAM STAIN: Culture: NO GROWTH

## 2014-04-09 LAB — PHOSPHORUS: Phosphorus: 3.8 mg/dL (ref 2.3–4.6)

## 2014-04-09 LAB — MAGNESIUM: MAGNESIUM: 1.7 mg/dL (ref 1.5–2.5)

## 2014-04-09 LAB — LIPASE, BLOOD: Lipase: 28 U/L (ref 11–59)

## 2014-04-09 LAB — HEPARIN LEVEL (UNFRACTIONATED): Heparin Unfractionated: 0.35 [IU]/mL (ref 0.30–0.70)

## 2014-04-09 SURGERY — LEFT HEART CATHETERIZATION WITH CORONARY ANGIOGRAM
Anesthesia: LOCAL

## 2014-04-09 MED ORDER — HEPARIN (PORCINE) IN NACL 2-0.9 UNIT/ML-% IJ SOLN
INTRAMUSCULAR | Status: AC
  Filled 2014-04-09: qty 1000

## 2014-04-09 MED ORDER — VERAPAMIL HCL 2.5 MG/ML IV SOLN
INTRAVENOUS | Status: AC
  Filled 2014-04-09: qty 2

## 2014-04-09 MED ORDER — POLYETHYLENE GLYCOL 3350 17 G PO PACK
17.0000 g | PACK | Freq: Two times a day (BID) | ORAL | Status: DC
  Administered 2014-04-09 – 2014-04-21 (×6): 17 g via ORAL
  Filled 2014-04-09 (×26): qty 1

## 2014-04-09 MED ORDER — FAMOTIDINE IN NACL 20-0.9 MG/50ML-% IV SOLN
INTRAVENOUS | Status: AC
  Filled 2014-04-09: qty 50

## 2014-04-09 MED ORDER — NITROGLYCERIN 0.2 MG/ML ON CALL CATH LAB
INTRAVENOUS | Status: AC
  Filled 2014-04-09: qty 1

## 2014-04-09 MED ORDER — SODIUM CHLORIDE 0.9 % IV SOLN
INTRAVENOUS | Status: AC
  Administered 2014-04-09: 14:00:00 via INTRAVENOUS

## 2014-04-09 MED ORDER — MAGNESIUM CITRATE PO SOLN
1.0000 | Freq: Once | ORAL | Status: AC
  Administered 2014-04-09: 1 via ORAL
  Filled 2014-04-09: qty 296

## 2014-04-09 MED ORDER — NITROGLYCERIN IN D5W 200-5 MCG/ML-% IV SOLN
10.0000 ug/min | INTRAVENOUS | Status: DC
  Administered 2014-04-09: 10 ug/min via INTRAVENOUS
  Filled 2014-04-09: qty 250

## 2014-04-09 MED ORDER — LIDOCAINE HCL (PF) 1 % IJ SOLN
INTRAMUSCULAR | Status: AC
  Filled 2014-04-09: qty 30

## 2014-04-09 MED ORDER — HEPARIN SODIUM (PORCINE) 1000 UNIT/ML IJ SOLN
INTRAMUSCULAR | Status: AC
  Filled 2014-04-09: qty 1

## 2014-04-09 MED ORDER — LEVOFLOXACIN 750 MG PO TABS
750.0000 mg | ORAL_TABLET | ORAL | Status: DC
  Filled 2014-04-09: qty 1

## 2014-04-09 MED ORDER — FENTANYL CITRATE 0.05 MG/ML IJ SOLN
INTRAMUSCULAR | Status: AC
  Filled 2014-04-09: qty 2

## 2014-04-09 MED ORDER — CARVEDILOL 6.25 MG PO TABS
6.2500 mg | ORAL_TABLET | Freq: Two times a day (BID) | ORAL | Status: DC
  Administered 2014-04-09 – 2014-04-12 (×5): 6.25 mg via ORAL
  Filled 2014-04-09 (×10): qty 1

## 2014-04-09 MED ORDER — HEPARIN (PORCINE) IN NACL 100-0.45 UNIT/ML-% IJ SOLN
1200.0000 [IU]/h | INTRAMUSCULAR | Status: DC
  Administered 2014-04-09: 1200 [IU]/h via INTRAVENOUS
  Administered 2014-04-10: 1350 [IU]/h via INTRAVENOUS
  Administered 2014-04-11 – 2014-04-12 (×2): 1300 [IU]/h via INTRAVENOUS
  Administered 2014-04-13 – 2014-04-15 (×4): 1200 [IU]/h via INTRAVENOUS
  Filled 2014-04-09 (×12): qty 250

## 2014-04-09 MED ORDER — ALUM & MAG HYDROXIDE-SIMETH 200-200-20 MG/5ML PO SUSP
15.0000 mL | Freq: Three times a day (TID) | ORAL | Status: DC
  Administered 2014-04-09 – 2014-04-10 (×4): 15 mL via ORAL
  Administered 2014-04-10: 12:00:00 via ORAL
  Administered 2014-04-11: 15 mL via ORAL
  Administered 2014-04-11: 10:00:00 via ORAL
  Administered 2014-04-11 – 2014-04-15 (×13): 15 mL via ORAL
  Filled 2014-04-09 (×25): qty 30

## 2014-04-09 MED ORDER — MORPHINE SULFATE 2 MG/ML IJ SOLN: 1.0000 mg | INTRAMUSCULAR | Status: DC | PRN

## 2014-04-09 NOTE — Telephone Encounter (Signed)
Message copied by Algernon Huxley on Wed Apr 09, 2014 10:00 AM ------      Message from: Irene Shipper      Created: Tue Apr 08, 2014  2:49 PM       Vaughan Basta, this patient is in the hospital with respiratory failure. She was to have outpatient colonoscopy. Please cancel that procedure appointment indefinitely. She will need to be reevaluated by me in the office after being completely cleared by her other doctors for possible procedure. Thanks ------

## 2014-04-09 NOTE — Progress Notes (Signed)
[facility]  Patient Status: Active (receiving services up to time of hospitalization)  [facility] is providing the following services: RN and PT   If patient discharges after hours, please call [phone].   [name] 04/09/2014, 11:21 AM

## 2014-04-09 NOTE — Telephone Encounter (Signed)
Appointment cancelled

## 2014-04-09 NOTE — CV Procedure (Signed)
     Left Heart Catheterization with Coronary Angiography  Report  [name]  73 y.o.  female May 15, 1941  Procedure Date: 04/09/2014 Referring Physician: Einar Crow, M.D. Primary Cardiologist:: Einar Crow, M.D.  INDICATIONS: Non-ST elevation acute coronary syndrome  PROCEDURE: 1. Left heart catheterization; 2. Left ventricular pressure recording; 3. Coronary angiography  CONSENT:  The risks, benefits, and details of the procedure were explained in detail to the patient. Risks including death, stroke, heart attack, kidney injury, allergy, limb ischemia, bleeding and radiation injury were discussed.  The patient verbalized understanding and wanted to proceed.  Informed written consent was obtained.  PROCEDURE TECHNIQUE:  After Xylocaine anesthesia a 5 French Slender sheath was placed in the right radial artery with an angiocath and the modified Seldinger technique.  Coronary angiography was done using a 5 F JR 4 and JL 3.5 cm diagnostic catheter.  Left ventricular hemodynamics were measured using the JR 4 catheter. Contrast injection was performed to minimize contrast load.   Angiography demonstrated severe left coronary disease. Heavy left coronary calcification was also noted on cinefluoroscopy.   CONTRAST:  Total of 50 cc.  COMPLICATIONS:  None   HEMODYNAMICS:  Aortic pressure 160/78 mmHg; LV pressure 162/21 mmHg; LVEDP 27 mm mercury  ANGIOGRAPHIC DATA:   The left main coronary artery is heavily calcified. There is 50-60% distal left main with ulcerated plaque.  The left anterior descending artery is heavily calcified with proximal eccentric 95% stenosis and mid high-grade, segmental diffuse 80% disease overlapping the origin of the first diagonal.  The left circumflex artery is narrowed to 60% at the ostium. The first obtuse marginal is a branching large vessel with the more inferior limb have an ostial 90% stenosis..  The right coronary artery is dominant. There is  moderate to heavy calcification. The proximal to mid vessel contains a focal eccentric 60-85% stenosis depending upon the view.   LEFT VENTRICULOGRAM:  Left ventricular and diastolic pressures are elevated. EF is known to be 35% by noninvasive imaging.  IMPRESSIONS:  1. Severe calcific 3 vessel coronary artery disease, involving the proximal and mid LAD most severely, the distal left main, the ostial circumflex, and the mid right coronary  2. Elevated left ventricular end-diastolic pressure and known systolic dysfunction  3. Presentation with ACS in acute systolic heart failure   RECOMMENDATION:  The patient has critical anatomy that would be most suited for surgical intervention. Her comorbidities make the possibility of surgery less likely. Will have the patient evaluated by the TCTS team.

## 2014-04-09 NOTE — Progress Notes (Signed)
ANTICOAGULATION CONSULT NOTE - Follow Up Consult  Pharmacy Consult for heparin Indication: chest pain/ACS  Labs:  Recent Labs  04/07/14 0257  04/08/14 0423 04/08/14 1834 04/09/14 0240  HGB 7.6*  --  9.1*  --   --   HCT 26.8*  --  30.9*  --   --   PLT 237  --  236  --   --   LABPROT  --   --  14.7  --   --   INR  --   --  1.17  --   --   HEPARINUNFRC 0.71*  < > 0.27* 0.44 0.35  CREATININE 1.50*  --  1.38*  --   --   < > = values in this interval not displayed.   Assessment: 73yo female on IV heparin for ACS. Heparin level remains therapeutic at current rate of 1200 units/hr. No bleeding noted. May do cath today.  Goal of Therapy:  Heparin level 0.3-0.7 units/ml   Plan:  Continue heparin at 1200 units/hr  F/u post cath or daily heparin level  Sherlon Handing, PharmD, BCPS Clinical pharmacist, pager [phone] 04/09/2014,3:54 AM

## 2014-04-09 NOTE — Progress Notes (Signed)
[facility]  [name] [id number] DOB: 04/01/1941 DOA: 04/05/2014 PCP: Leola Brazil, MD  Time spent :  Brief narrative: 73 yo with COPD / reactive airway disease brought in [facility] ED in respiratory distress (presumed severe COPD exacerbation) on 5/9, intubated. Pt recently discharged  03/21/14 after treatment for GIB. OP colonoscopy planned. Pt did see Dr. Henrene Pastor as an OP on 5/6 and procedure planned for 5/20. At that OP visit pt was noted to have LE edema. During last admission ECHO (4/20) revealed combined systolic and diastolic HF with an EF of 35% hypokinetic RWMA. Cardiac enzymes had been cycled during the last admission and had been normal. Her EKG at that time had been nonischemic. She had had a normal Myoview Jan 2015.  This admission she developed chest pain and her enzymes were positive and peaked > 20. She also had ST segment elevation. Cardiology was consulted. ECHO was repeated this admission with stable EF. CXR c/w pulmonary edema requiring aggressive diuresis. Cardiac cath planned for 5/13.  HPI/Subjective: Pt endorsed abdominal bloating and discomfort. No BM since admit?  Assessment/Plan: Active Problems:   Acute respiratory failure with hypoxia:   A) Acute exacerbation of chronic obstructive pulmonary disease (COPD)   B) Chronic combined systolic and diastolic CHF (congestive heart failure)   C) Right systolic dysfunction   D) Severe pulmonary hypertension 60 mmHg/moderate tricuspid regurgitation -stable on [place] oxygen -diuresis per cards -no evidence of COPD exacerbation or PNA so dc Levaquin -May be somewhat preload dependent so caution with diuresis -Apparently has progression of pericardial effusion-defer to cards      3V CAD/STEMI  -Cath today revealed severe three-vessel coronary artery disease likely best suited for surgical intervention-cardiologist has consulted TCTS -Cont ASA, BB and IV Heparin for now   DIABETES-TYPE 2 -CBGs controlled -cont Lantus and SSI -was on Metformin at home- per last dc summary was supposed to stop Amaryl and begin Januvia    HYPERTENSION -moderate control -cont Coreg and Lasix    Constipation -XR 5/11 c/w constipation -begin bowel regimen post cath -mg citrate x 1 bottle -cont senokot    ANEMIA, IRON DEFICIENCY -Hgb stable    Psoriatic arthritis -cont Prednisone    GI bleeding -stable this admit despite IV Heparin -OP colonoscopy at discretion of GI given recent STEMI   DVT prophylaxis: IV heparin Code Status: Full Family Communication: No family at bedside Disposition Plan/Expected LOS: Step down   Consultants: PCCM Cardiology  Procedures: Echocardiogram - Left ventricle: The cavity size was mildly dilated. Wall thickness was normal. Systolic function was moderately reduced. The estimated ejection fraction was in the range of 35% to 40%. There is moderate hypokinesis of the mid-distalinferior myocardium. Doppler parameters are consistent with abnormal left ventricular relaxation (grade 1 diastolic dysfunction). - Mitral valve: Mild regurgitation. - Left atrium: The atrium was moderately dilated. - Right ventricle: The cavity size was mildly dilated. - Right atrium: The atrium was moderately dilated. - Atrial septum: No defect or patent foramen ovale was identified. - Tricuspid valve: Moderate regurgitation. - Pulmonary arteries: PA peak pressure: 66mm Hg (S). - Pericardium, extracardiac: A small to moderate, free-flowing pericardial effusion was identified. The fluid had no internal echoes.There was no evidence of hemodynamic compromise.  Cardiac catheterization pending  Antibiotics: Zosyn 5/9 >>>off  Vancomycin 5/9 >>> 5/10  Levaquin 5/9 >>> 5/13  CULTURES:  5/9 Blood >>>  5/9 MRSA >>> neg  Objective: Blood pressure 147/66, pulse 81,  temperature 99.1 F (37.3 C), temperature source Oral, resp. rate 23, height 5' 4.17"  (1.63 m), weight 167 lb 1.7 oz (75.8 kg), SpO2 96.00%.  Intake/Output Summary (Last 24 hours) at 04/09/14 1347 Last data filed at 04/09/14 1238  Gross per 24 hour  Intake 581.23 ml  Output   2550 ml  Net -1968.77 ml     Exam: General: No acute respiratory distress Lungs: Clear to auscultation bilaterally without wheezes or crackles, 2 L Cardiovascular: Regular rate and rhythm without murmur gallop or rub normal S1 and S2, no peripheral edema or JVD Abdomen: Minimally tender epigastrium, distended, soft, bowel sounds positive, no rebound, no ascites, no appreciable mass Musculoskeletal: No significant cyanosis, clubbing of bilateral lower extremities Neurological: Alert and oriented x 3, moves all extremities x 4 without focal neurological deficits, CN 2-12 intact  Scheduled Meds:  Scheduled Meds: . alum & mag hydroxide-simeth  15 mL Oral TID  . aspirin  325 mg Oral Daily  . atorvastatin  20 mg Per Tube q1800  . brimonidine  1 drop Right Eye TID   And  . brinzolamide  1 drop Right Eye TID  . budesonide  0.25 mg Nebulization BID  . carvedilol  6.25 mg Oral BID WC  . cycloSPORINE  1 drop Both Eyes BID  . furosemide  40 mg Intravenous BID  . insulin aspart  0-15 Units Subcutaneous TID AC & HS  . insulin glargine  10 Units Subcutaneous QHS  . ipratropium-albuterol  3 mL Nebulization Q6H  . latanoprost  1 drop Both Eyes QHS  . [START ON 04/10/2014] levofloxacin  750 mg Oral Q48H  . magnesium citrate  1 Bottle Oral Once  . pantoprazole  40 mg Oral Q1200  . polyethylene glycol  17 g Oral BID  . predniSONE  40 mg Oral Q breakfast  . senna-docusate  2 tablet Oral BID  . sodium chloride  3 mL Intravenous Q12H   Continuous Infusions: . sodium chloride 10 mL/hr at 04/09/14 1200  . heparin Stopped (04/09/14 1238)    Data Reviewed: Basic Metabolic Panel:  Recent Labs Lab 04/05/14 0727 04/06/14 0240 04/07/14 0257 04/07/14 1002 04/07/14 2248 04/08/14 0423 04/09/14 0240  NA  141 137 140  --   --  140 145  K 4.5 4.6 4.6  --   --  4.7 4.1  CL 102 100 104  --   --  104 102  CO2 21 22 21   --   --  24 29  GLUCOSE 341* 266* 206*  --   --  179* 105*  BUN 15 21 28*  --   --  30* 29*  CREATININE 1.16* 1.18* 1.50*  --   --  1.38* 1.33*  CALCIUM 8.4 8.8 8.6  --   --  9.2 9.8  MG  --   --  2.2  --   --  1.9 1.7  PHOS  --   --  3.7 3.1 3.9 4.7* 3.8   Liver Function Tests:  Recent Labs Lab 04/05/14 0727  AST 82*  ALT 52*  ALKPHOS 93  BILITOT 0.3  PROT 6.1  ALBUMIN 2.9*    Recent Labs Lab 04/09/14 0240  LIPASE 28  AMYLASE 79   No results found for this basename: AMMONIA,  in the last 168 hours CBC:  Recent Labs Lab 04/05/14 0727 04/06/14 0240 04/07/14 0257 04/08/14 0423 04/09/14 0240  WBC 14.8* 12.4* 17.8* 13.4* 11.8*  NEUTROABS 9.8*  --   --  11.8* 8.7*  HGB 8.1* 8.1* 7.6* 9.1* 9.4*  HCT 29.5* 28.5* 26.8* 30.9* 32.3*  MCV 80.6 77.7* 78.6 79.4 78.4  PLT 323 231 237 236 253   Cardiac Enzymes:  Recent Labs Lab 04/05/14 0742 04/05/14 1135 04/05/14 1532 04/05/14 2210  TROPONINI <0.30 7.05* >20.00* >20.00*   BNP (last 3 results)  Recent Labs  04/05/14 0742 04/08/14 0423 04/08/14 1140  PROBNP 9378.0* 13832.0* 21317.0*   CBG:  Recent Labs Lab 04/08/14 1507 04/08/14 1811 04/08/14 2301 04/09/14 0740 04/09/14 1229  GLUCAP 86 88 109* 101* 113*    Recent Results (from the past 240 hour(s))  CULTURE, BLOOD (ROUTINE X 2)     Status: None   Collection Time    04/05/14 10:50 AM      Result Value Ref Range Status   Specimen Description BLOOD RIGHT HAND   Final   Special Requests BOTTLES DRAWN AEROBIC AND ANAEROBIC 5CC   Final   Culture  Setup Time     Final   Value: 04/05/2014 17:38     Performed at [facility]   Culture     Final   Value:        BLOOD CULTURE RECEIVED NO GROWTH TO DATE CULTURE WILL BE HELD FOR 5 DAYS BEFORE ISSUING A FINAL NEGATIVE REPORT     Performed at [facility]   Report Status PENDING    Incomplete  CULTURE, BLOOD (ROUTINE X 2)     Status: None   Collection Time    04/05/14 11:35 AM      Result Value Ref Range Status   Specimen Description BLOOD LEFT HAND   Final   Special Requests BOTTLES DRAWN AEROBIC AND ANAEROBIC 10CC   Final   Culture  Setup Time     Final   Value: 04/05/2014 17:38     Performed at [facility]   Culture     Final   Value:        BLOOD CULTURE RECEIVED NO GROWTH TO DATE CULTURE WILL BE HELD FOR 5 DAYS BEFORE ISSUING A FINAL NEGATIVE REPORT     Performed at [facility]   Report Status PENDING   Incomplete  MRSA PCR SCREENING     Status: None   Collection Time    04/05/14  2:44 PM      Result Value Ref Range Status   MRSA by PCR NEGATIVE  NEGATIVE Final   Comment:            The GeneXpert MRSA Assay (FDA     approved for NASAL specimens     only), is one component of a     comprehensive MRSA colonization     surveillance program. It is not     intended to diagnose MRSA     infection nor to guide or     monitor treatment for     MRSA infections.  CULTURE, RESPIRATORY (NON-EXPECTORATED)     Status: None   Collection Time    04/07/14 12:16 PM      Result Value Ref Range Status   Specimen Description TRACHEAL ASPIRATE   Final   Special Requests NONE   Final   Gram Stain     Final   Value: FEW WBC PRESENT,BOTH PMN AND MONONUCLEAR     NO SQUAMOUS EPITHELIAL CELLS SEEN     NO ORGANISMS SEEN     Performed at [facility]     Final  Value: NO GROWTH     Performed at [facility]   Report Status 04/09/2014 FINAL   Final     Studies:  Recent x-ray studies have been reviewed in detail by the Attending Physician       Erin Hearing, ANP [facility] Office  [phone] Pager [phone]  **Disclaimer: This note may have been dictated with voice recognition software. Similar sounding words can inadvertently be transcribed and this note may contain transcription errors which may not  have been corrected upon publication of note.**   **If unable to reach the above provider after paging please contact the Flow Manager @ [phone]  On-Call/Text Page:      [email or web address]      password [email or web address]  If 7PM-7AM, please contact night-coverage [email or web address] Password [email or web address] 04/09/2014, 1:47 PM   LOS: 4 days   Examining patient with ANP Ebony Hail and discussed assessment and plan of care. Agree with plan of care. Discuss plan of care with patient and answered all questions. Patient care greater than 35 minute

## 2014-04-09 NOTE — Interval H&P Note (Signed)
Cath Lab Visit (complete for each Cath Lab visit)  Clinical Evaluation Leading to the Procedure:   ACS: yes  Non-ACS:    Anginal Classification: CCS III  Anti-ischemic medical therapy: Maximal Therapy (2 or more classes of medications)  Non-Invasive Test Results: No non-invasive testing performed  Prior CABG: No previous CABG      History and Physical Interval Note:  04/09/2014 1:02 PM  [name]  has presented today for surgery, with the diagnosis of cp  The various methods of treatment have been discussed with the patient and family. After consideration of risks, benefits and other options for treatment, the patient has consented to  Procedure(s): LEFT HEART CATHETERIZATION WITH CORONARY ANGIOGRAM (N/A) as a surgical intervention .  The patient's history has been reviewed, patient examined, no change in status, stable for surgery.  I have reviewed the patient's chart and labs.  Questions were answered to the patient's satisfaction.     Belva Crome III

## 2014-04-09 NOTE — Progress Notes (Signed)
 SUBJECTIVE: 73 y.o. female with a h/o hypertension, combined chronic CHF (echocardiogram 03/09/2014 EF 35% with diffuse hypokinesis and grade 1 diastolic dysfunction), COPD, diabetes, with A1c 7.2% on 01/03/2013, peripheral arterial disease, pulmonary hypertension, GI bleed, esophageal candidiasis and rheumatoid arthritis on chronic steroids was admitted to the critical care service with acute respiratory failure. Had Elevated troponins. EKG changes with increased ST segment elevations lead in V2, V3 and V4. These changes were present on her prior EKG of 03/17/2014 but are not more prominent.  [name] was seen and examined at bedside this morning.  Self-extubated yesterday and has been doing well. However, she is complaining of abdominal pain with " a lot of gas". She has not had a BM for several days. She has no vomiting but has nausea. No chest pain. Diuresing well.   . aspirin  325 mg Oral Daily  . atorvastatin  20 mg Per Tube q1800  . brimonidine  1 drop Right Eye TID   And  . brinzolamide  1 drop Right Eye TID  . budesonide  0.25 mg Nebulization BID  . cycloSPORINE  1 drop Both Eyes BID  . furosemide  40 mg Intravenous BID  . insulin aspart  0-15 Units Subcutaneous TID AC & HS  . insulin glargine  10 Units Subcutaneous QHS  . ipratropium-albuterol  3 mL Nebulization Q6H  . latanoprost  1 drop Both Eyes QHS  . [START ON 04/10/2014] levofloxacin  750 mg Oral Q48H  . metoprolol  2.5 mg Intravenous 4 times per day  . pantoprazole  40 mg Oral Q1200  . predniSONE  40 mg Oral Q breakfast  . senna-docusate  2 tablet Oral BID  . sodium chloride  3 mL Intravenous Q12H   . sodium chloride 10 mL/hr (04/09/14 0601)  . heparin 1,200 Units/hr (04/08/14 1800)   OBJECTIVE: Physical Exam: Filed Vitals:   04/09/14 0307 04/09/14 0400 04/09/14 0500 04/09/14 0735  BP:  136/61  145/71  Pulse:  74  78  Temp:  98.7 F (37.1 C)  98.9 F (37.2 C)  TempSrc:  Oral  Oral  Resp:  19  21  Height:       Weight:   167 lb 1.7 oz (75.8 kg)   SpO2: 97% 95%  98%    Intake/Output Summary (Last 24 hours) at 04/09/14 0740 Last data filed at 04/09/14 0601  Gross per 24 hour  Intake    474 ml  Output   2300 ml  Net  -1826 ml   General: lying in bed HEENT: EOMI Neck: JVP 10 cm Cardiovascular: RRR Lungs: b/l rales, mild expiratory wheeze Abdomen: mild upper abdominal tenderness, distended with voluntary guarding, +bs. Musculoskeletal: Moves all extremities, no edema, hyperpigmented, bilateral, lower extremities related to PVD. Left Midfoot amputation.  Neuro: alert, awake, and oriented  LABS: Basic Metabolic Panel:  Recent Labs  04/08/14 0423 04/09/14 0240  NA 140 145  K 4.7 4.1  CL 104 102  CO2 24 29  GLUCOSE 179* 105*  BUN 30* 29*  CREATININE 1.38* 1.33*  CALCIUM 9.2 9.8  MG 1.9 1.7  PHOS 4.7* 3.8   CBC:  Recent Labs  04/08/14 0423 04/09/14 0240  WBC 13.4* 11.8*  NEUTROABS 11.8* 8.7*  HGB 9.1* 9.4*  HCT 30.9* 32.3*  MCV 79.4 78.4  PLT 236 253   RADIOLOGY: Dg Chest Port 1 View  04/05/2014   CLINICAL DATA:  Orogastric tube placement at bedside.  EXAM: PORTABLE CHEST - 1 VIEW    COMPARISON:  DG CHEST 1V PORT dated 04/05/2014; DG CHEST 1V PORT dated 03/16/2014; DG CHEST 1V PORT dated 12/01/2013; DG CHEST 1V PORT dated 11/29/2013  FINDINGS: Orogastric tube courses below the diaphragm into the stomach with its tip in the proximal body. Endotracheal tube tip is in the right mainstem bronchus. Cardiac silhouette enlarged. Interval improvement in the interstitial pulmonary edema since the examination earlier same date. Mild atelectasis in the lung bases, right greater than left, likely related to suboptimal inspiration.  IMPRESSION: 1. OG tube tip in the proximal body of the stomach. 2. Endotracheal tube tip in the proximal right mainstem bronchus. This should be withdrawn 3-4 cm. 3. Improved interstitial pulmonary edema since the examination earlier today. 4. Mild bibasilar atelectasis,  right greater than left, likely due to suboptimal inspiration. These results were called by telephone at the time of interpretation on 04/05/2014 at 4:36 PM to [name], the nurse caring for the patient in the ICU, who verbally acknowledged these results.   Electronically Signed   By: Thomas  Lawrence M.D.   On: 04/05/2014 16:38   Dg Chest Port 1 View  04/05/2014   CLINICAL DATA:  Respiratory distress, endotracheal tube placed  EXAM: PORTABLE CHEST - 1 VIEW  COMPARISON:  DG CHEST 1V PORT dated 03/16/2014; DG CHEST 1V PORT dated 12/01/2013; DG CHEST 1V PORT dated 11/29/2013  FINDINGS: Endotracheal tube tip is 1 cm above the carinal. There is moderate cardiac enlargement. There is vascular congestion. There is moderate interstitial prominence suggesting pulmonary edema, a change from prior study.  IMPRESSION: Pulmonary edema.  Endotracheal tube tip 1 cm above carina.   Electronically Signed   By: Raymond  Rubner M.D.   On: 04/05/2014 08:07   Dg Chest Portable 1 View  03/16/2014   CLINICAL DATA:  Shortness of breath. Panic attack. COPD. Exertional asthma.  EXAM: PORTABLE CHEST - 1 VIEW  COMPARISON:  12/01/2013  FINDINGS: Mild to moderate cardiomegaly is stable. Both lungs remain clear. No evidence of pleural effusion. Mild tortuosity of thoracic aorta appears stable.  IMPRESSION: Stable cardiomegaly.  No acute findings.   Electronically Signed   By: John  Stahl M.D.   On: 03/16/2014 12:05   Dg Abd Portable 1v  03/16/2014   CLINICAL DATA:  Abdominal distention and discomfort.  EXAM: PORTABLE ABDOMEN - 1 VIEW  COMPARISON:  Abdominal radiograph 12/04/2013.  FINDINGS: Gas and stool are seen scattered throughout the colon extending to the level of the distal rectum. No pathologic distension of small bowel is noted. No gross evidence of pneumoperitoneum.  IMPRESSION: 1.  Nonobstructive bowel gas pattern. 2. No pneumoperitoneum.   Electronically Signed   By: Rajesh Wyss  Entrikin M.D.   On: 03/16/2014 17:04   ASSESSMENT AND  PLAN:  Principal Problem:   Acute respiratory failure Active Problems:   DIABETES-TYPE 2   ANEMIA, IRON DEFICIENCY   HYPERTENSION   Acute exacerbation of chronic obstructive pulmonary disease (COPD)   GI bleeding   COPD (chronic obstructive pulmonary disease)   STEMI (ST elevation myocardial infarction)  STEMI--? Acute anteroseptal MI : troponin's trended up on admission to >20 on 04/05/14. EKG changes with increased ST segment elevations lead in V2, V3 and V4 with t wave inversions in lateral leads that do not appear to be new. Echo 5/10 with EF 35-40%.  Myoview scan 12/05/2012 was normal. No prior history of cardiac catheterization. Chest x-ray with pulmonary edema versus infectious process.  - Continue with heparin gtt pending cath today - Will switch to carvedilol -   on lipitor 20mg . Has h/o of statin intolerance - Hemoglobin Goal for transfusion of 8, hx of GIB April 2015 - Continue lasix 40mg  BID - Will start ACE or ARB after cath. Restart home hydralazine as needed  Acute on chronic combined CHF: proBNP elevated at 9000 with congestion on CXR. Repeat Echo with EF 35-40% with moderately reduced systolic function and grade 1 DD with moderate hypokinesis of mid-distal inferior myocardium.  Also has an increased pericardial effusion (small to moderate). No prior history of cardiac catheterization. She is diuresing well and is down by 6lb since admission.  - Continue IV lasix with improving renal function - Monitor electrolytes and replace as needed  -S tarting carvedilol. ACE/ARB after cath  Abdominal Pain: likely due to constipation versus dyspepsia.  Will give simethicone and milarax. Will evaluate closely after cath.  Acute respiratory failure 2/2 ?PNA and/or pulm edema: Initially intubated for three days. Currently on IV Levofloxacin.  - extubated yesterday - on lasix IV as above - Blood cx x2 pending, NGTD, respiratory culture pending  Acute on chronic anemia (iron deficiency)  with hx of3 GIB in past. Was supposed to have further outpatient work up but unclear if that has been done. Hx of prior blood transfusion. Baseline ~9.   Plan  - S/p 1 unit PRBC transfusion on 5/11 -continue to monitor -transfuse with goal Hb >8, currently 9.4  Acute on chronic renal failure, CKD 2--Cr trending down 1.5>>1.33. Baseline <1 -continue to trend -improved with diuresis  Hypertension: Initially hypotensive, now improved.  - continue BB, but still holding home hydralazine  DM2--Last A1C 7.2 in Feb 2014.  -SSI moderate and Lantus 10 qhs  Case discussed and patient seen with Dr. Haroldine Laws  Signed:  Jessee Avers, MD PGY-2 Internal Medicine Teaching Service Pager: [phone] 04/09/2014, 8:11 AM   Patient seen and examined with Dr. Alice Rieger. We discussed all aspects of the encounter. I agree with the assessment and plan as stated above. Improving steadily. Still with volume on board. Will continue diuresis. Start carvedilol. Cath today. Consider ACE/ARB post cath.    Shaune Pascal Kayden Amend,MD 11:50 AM

## 2014-04-09 NOTE — H&P (View-Only) (Signed)
SUBJECTIVE: 73 y.o. female with a h/o hypertension, combined chronic CHF (echocardiogram 03/09/2014 EF 35% with diffuse hypokinesis and grade 1 diastolic dysfunction), COPD, diabetes, with A1c 7.2% on 01/03/2013, peripheral arterial disease, pulmonary hypertension, GI bleed, esophageal candidiasis and rheumatoid arthritis on chronic steroids was admitted to the critical care service with acute respiratory failure. Had Elevated troponins. EKG changes with increased ST segment elevations lead in V2, V3 and V4. These changes were present on her prior EKG of 03/17/2014 but are not more prominent.  [name] was seen and examined at bedside this morning.  Self-extubated yesterday and has been doing well. However, she is complaining of abdominal pain with " a lot of gas". She has not had a BM for several days. She has no vomiting but has nausea. No chest pain. Diuresing well.   . aspirin  325 mg Oral Daily  . atorvastatin  20 mg Per Tube q1800  . brimonidine  1 drop Right Eye TID   And  . brinzolamide  1 drop Right Eye TID  . budesonide  0.25 mg Nebulization BID  . cycloSPORINE  1 drop Both Eyes BID  . furosemide  40 mg Intravenous BID  . insulin aspart  0-15 Units Subcutaneous TID AC & HS  . insulin glargine  10 Units Subcutaneous QHS  . ipratropium-albuterol  3 mL Nebulization Q6H  . latanoprost  1 drop Both Eyes QHS  . [START ON 04/10/2014] levofloxacin  750 mg Oral Q48H  . metoprolol  2.5 mg Intravenous 4 times per day  . pantoprazole  40 mg Oral Q1200  . predniSONE  40 mg Oral Q breakfast  . senna-docusate  2 tablet Oral BID  . sodium chloride  3 mL Intravenous Q12H   . sodium chloride 10 mL/hr (04/09/14 0601)  . heparin 1,200 Units/hr (04/08/14 1800)   OBJECTIVE: Physical Exam: Filed Vitals:   04/09/14 0307 04/09/14 0400 04/09/14 0500 04/09/14 0735  BP:  136/61  145/71  Pulse:  74  78  Temp:  98.7 F (37.1 C)  98.9 F (37.2 C)  TempSrc:  Oral  Oral  Resp:  19  21  Height:       Weight:   167 lb 1.7 oz (75.8 kg)   SpO2: 97% 95%  98%    Intake/Output Summary (Last 24 hours) at 04/09/14 0740 Last data filed at 04/09/14 0601  Gross per 24 hour  Intake    474 ml  Output   2300 ml  Net  -1826 ml   General: lying in bed HEENT: EOMI Neck: JVP 10 cm Cardiovascular: RRR Lungs: b/l rales, mild expiratory wheeze Abdomen: mild upper abdominal tenderness, distended with voluntary guarding, +bs. Musculoskeletal: Moves all extremities, no edema, hyperpigmented, bilateral, lower extremities related to PVD. Left Midfoot amputation.  Neuro: alert, awake, and oriented  LABS: Basic Metabolic Panel:  Recent Labs  04/08/14 0423 04/09/14 0240  NA 140 145  K 4.7 4.1  CL 104 102  CO2 24 29  GLUCOSE 179* 105*  BUN 30* 29*  CREATININE 1.38* 1.33*  CALCIUM 9.2 9.8  MG 1.9 1.7  PHOS 4.7* 3.8   CBC:  Recent Labs  04/08/14 0423 04/09/14 0240  WBC 13.4* 11.8*  NEUTROABS 11.8* 8.7*  HGB 9.1* 9.4*  HCT 30.9* 32.3*  MCV 79.4 78.4  PLT 236 253   RADIOLOGY: Dg Chest Port 1 View  04/05/2014   CLINICAL DATA:  Orogastric tube placement at bedside.  EXAM: PORTABLE CHEST - 1 VIEW  COMPARISON:  DG CHEST 1V PORT dated 04/05/2014; DG CHEST 1V PORT dated 03/16/2014; DG CHEST 1V PORT dated 12/01/2013; DG CHEST 1V PORT dated 11/29/2013  FINDINGS: Orogastric tube courses below the diaphragm into the stomach with its tip in the proximal body. Endotracheal tube tip is in the right mainstem bronchus. Cardiac silhouette enlarged. Interval improvement in the interstitial pulmonary edema since the examination earlier same date. Mild atelectasis in the lung bases, right greater than left, likely related to suboptimal inspiration.  IMPRESSION: 1. OG tube tip in the proximal body of the stomach. 2. Endotracheal tube tip in the proximal right mainstem bronchus. This should be withdrawn 3-4 cm. 3. Improved interstitial pulmonary edema since the examination earlier today. 4. Mild bibasilar atelectasis,  right greater than left, likely due to suboptimal inspiration. These results were called by telephone at the time of interpretation on 04/05/2014 at 4:36 PM to [facility], the nurse caring for the patient in the ICU, who verbally acknowledged these results.   Electronically Signed   By: Evangeline Dakin M.D.   On: 04/05/2014 16:38   Dg Chest Port 1 View  04/05/2014   CLINICAL DATA:  Respiratory distress, endotracheal tube placed  EXAM: PORTABLE CHEST - 1 VIEW  COMPARISON:  DG CHEST 1V PORT dated 03/16/2014; DG CHEST 1V PORT dated 12/01/2013; DG CHEST 1V PORT dated 11/29/2013  FINDINGS: Endotracheal tube tip is 1 cm above the carinal. There is moderate cardiac enlargement. There is vascular congestion. There is moderate interstitial prominence suggesting pulmonary edema, a change from prior study.  IMPRESSION: Pulmonary edema.  Endotracheal tube tip 1 cm above carina.   Electronically Signed   By: Skipper Cliche M.D.   On: 04/05/2014 08:07   Dg Chest Portable 1 View  03/16/2014   CLINICAL DATA:  Shortness of breath. Panic attack. COPD. Exertional asthma.  EXAM: PORTABLE CHEST - 1 VIEW  COMPARISON:  12/01/2013  FINDINGS: Mild to moderate cardiomegaly is stable. Both lungs remain clear. No evidence of pleural effusion. Mild tortuosity of thoracic aorta appears stable.  IMPRESSION: Stable cardiomegaly.  No acute findings.   Electronically Signed   By: Earle Gell M.D.   On: 03/16/2014 12:05   Dg Abd Portable 1v  03/16/2014   CLINICAL DATA:  Abdominal distention and discomfort.  EXAM: PORTABLE ABDOMEN - 1 VIEW  COMPARISON:  Abdominal radiograph 12/04/2013.  FINDINGS: Gas and stool are seen scattered throughout the colon extending to the level of the distal rectum. No pathologic distension of small bowel is noted. No gross evidence of pneumoperitoneum.  IMPRESSION: 1.  Nonobstructive bowel gas pattern. 2. No pneumoperitoneum.   Electronically Signed   By: Vinnie Langton M.D.   On: 03/16/2014 17:04   ASSESSMENT AND  PLAN:  Principal Problem:   Acute respiratory failure Active Problems:   DIABETES-TYPE 2   ANEMIA, IRON DEFICIENCY   HYPERTENSION   Acute exacerbation of chronic obstructive pulmonary disease (COPD)   GI bleeding   COPD (chronic obstructive pulmonary disease)   STEMI (ST elevation myocardial infarction)  STEMI--? Acute anteroseptal MI : troponin's trended up on admission to >20 on 04/05/14. EKG changes with increased ST segment elevations lead in V2, V3 and V4 with t wave inversions in lateral leads that do not appear to be new. Echo 5/10 with EF 35-40%.  Myoview scan 12/05/2012 was normal. No prior history of cardiac catheterization. Chest x-ray with pulmonary edema versus infectious process.  - Continue with heparin gtt pending cath today - Will switch to carvedilol -  on lipitor 20mg . Has h/o of statin intolerance - Hemoglobin Goal for transfusion of 8, hx of GIB April 2015 - Continue lasix 40mg  BID - Will start ACE or ARB after cath. Restart home hydralazine as needed  Acute on chronic combined CHF: proBNP elevated at 9000 with congestion on CXR. Repeat Echo with EF 35-40% with moderately reduced systolic function and grade 1 DD with moderate hypokinesis of mid-distal inferior myocardium.  Also has an increased pericardial effusion (small to moderate). No prior history of cardiac catheterization. She is diuresing well and is down by 6lb since admission.  - Continue IV lasix with improving renal function - Monitor electrolytes and replace as needed  -S tarting carvedilol. ACE/ARB after cath  Abdominal Pain: likely due to constipation versus dyspepsia.  Will give simethicone and milarax. Will evaluate closely after cath.  Acute respiratory failure 2/2 ?PNA and/or pulm edema: Initially intubated for three days. Currently on IV Levofloxacin.  - extubated yesterday - on lasix IV as above - Blood cx x2 pending, NGTD, respiratory culture pending  Acute on chronic anemia (iron deficiency)  with hx of3 GIB in past. Was supposed to have further outpatient work up but unclear if that has been done. Hx of prior blood transfusion. Baseline ~9.   Plan  - S/p 1 unit PRBC transfusion on 5/11 -continue to monitor -transfuse with goal Hb >8, currently 9.4  Acute on chronic renal failure, CKD 2--Cr trending down 1.5>>1.33. Baseline <1 -continue to trend -improved with diuresis  Hypertension: Initially hypotensive, now improved.  - continue BB, but still holding home hydralazine  DM2--Last A1C 7.2 in Feb 2014.  -SSI moderate and Lantus 10 qhs  Case discussed and patient seen with Dr. Haroldine Laws  Signed:  Jessee Avers, MD PGY-2 Internal Medicine Teaching Service Pager: [phone] 04/09/2014, 8:11 AM   Patient seen and examined with Dr. Alice Rieger. We discussed all aspects of the encounter. I agree with the assessment and plan as stated above. Improving steadily. Still with volume on board. Will continue diuresis. Start carvedilol. Cath today. Consider ACE/ARB post cath.    Shaune Pascal Leafy Motsinger,MD 11:50 AM

## 2014-04-09 NOTE — Progress Notes (Signed)
ANTICOAGULATION CONSULT NOTE - Follow Up Consult  Pharmacy Consult for heparin Indication: chest pain/ACS  Labs:  Recent Labs  04/07/14 0257  04/08/14 0423 04/08/14 1834 04/09/14 0240  HGB 7.6*  --  9.1*  --  9.4*  HCT 26.8*  --  30.9*  --  32.3*  PLT 237  --  236  --  253  LABPROT  --   --  14.7  --   --   INR  --   --  1.17  --   --   HEPARINUNFRC 0.71*  < > 0.27* 0.44 0.35  CREATININE 1.50*  --  1.38*  --  1.33*  < > = values in this interval not displayed.   Assessment: 73 yo F began on heparin IV gtt for ACS.  She is now s/p cath (TR band in place) and plans for TCTS team evaluation for surgical intervention.  Pharmacy consulted to restart heparin gtt.  This morning's HL prior to cath was therapeutic (0.35) at a rate of 1200 units/hr.  Other pertinent labs revealed H/H low but stable and plt wnl.  No reports of bleeding issues noted.    Goal of Therapy:  Heparin level 0.3-0.7 units/ml   Plan:  - restart heparin IV gtt at 1200 units/hr, no bolus - draw 8h HL - daily HL and CBC  - monitor for s/s of bleeding  Ovid Curd E. Jacqlyn Larsen, PharmD Clinical Pharmacist - Resident Pager: [phone] Pharmacy: [phone] 04/09/2014 3:57 PM

## 2014-04-10 DIAGNOSIS — I272 Pulmonary hypertension, unspecified: Secondary | ICD-10-CM | POA: Diagnosis present

## 2014-04-10 DIAGNOSIS — I2510 Atherosclerotic heart disease of native coronary artery without angina pectoris: Secondary | ICD-10-CM

## 2014-04-10 DIAGNOSIS — I214 Non-ST elevation (NSTEMI) myocardial infarction: Secondary | ICD-10-CM

## 2014-04-10 DIAGNOSIS — I2789 Other specified pulmonary heart diseases: Secondary | ICD-10-CM

## 2014-04-10 LAB — CBC WITH DIFFERENTIAL/PLATELET
BASOS PCT: 0 % (ref 0–1)
Basophils Absolute: 0 10*3/uL (ref 0.0–0.1)
Eosinophils Absolute: 0 10*3/uL (ref 0.0–0.7)
Eosinophils Relative: 0 % (ref 0–5)
HEMATOCRIT: 31.5 % — AB (ref 36.0–46.0)
HEMOGLOBIN: 9.3 g/dL — AB (ref 12.0–15.0)
LYMPHS PCT: 8 % — AB (ref 12–46)
Lymphs Abs: 1 10*3/uL (ref 0.7–4.0)
MCH: 22.9 pg — AB (ref 26.0–34.0)
MCHC: 29.5 g/dL — ABNORMAL LOW (ref 30.0–36.0)
MCV: 77.4 fL — AB (ref 78.0–100.0)
Monocytes Absolute: 1.8 10*3/uL — ABNORMAL HIGH (ref 0.1–1.0)
Monocytes Relative: 14 % — ABNORMAL HIGH (ref 3–12)
NEUTROS ABS: 9.7 10*3/uL — AB (ref 1.7–7.7)
Neutrophils Relative %: 78 % — ABNORMAL HIGH (ref 43–77)
Platelets: 217 10*3/uL (ref 150–400)
RBC: 4.07 MIL/uL (ref 3.87–5.11)
RDW: 27.9 % — AB (ref 11.5–15.5)
WBC: 12.5 10*3/uL — ABNORMAL HIGH (ref 4.0–10.5)

## 2014-04-10 LAB — BLOOD GAS, ARTERIAL
Acid-Base Excess: 6.4 mmol/L — ABNORMAL HIGH (ref 0.0–2.0)
Bicarbonate: 30.8 mEq/L — ABNORMAL HIGH (ref 20.0–24.0)
Drawn by: 249101
FIO2: 0.21 %
O2 Saturation: 87.2 %
Patient temperature: 98.6
TCO2: 32.3 mmol/L (ref 0–100)
pCO2 arterial: 48.1 mmHg — ABNORMAL HIGH (ref 35.0–45.0)
pH, Arterial: 7.423 (ref 7.350–7.450)
pO2, Arterial: 55.3 mmHg — ABNORMAL LOW (ref 80.0–100.0)

## 2014-04-10 LAB — HEPARIN LEVEL (UNFRACTIONATED)
HEPARIN UNFRACTIONATED: 0.43 [IU]/mL (ref 0.30–0.70)
Heparin Unfractionated: 0.25 IU/mL — ABNORMAL LOW (ref 0.30–0.70)
Heparin Unfractionated: 0.57 IU/mL (ref 0.30–0.70)

## 2014-04-10 LAB — MAGNESIUM: Magnesium: 2 mg/dL (ref 1.5–2.5)

## 2014-04-10 LAB — BASIC METABOLIC PANEL
BUN: 29 mg/dL — AB (ref 6–23)
CALCIUM: 9.9 mg/dL (ref 8.4–10.5)
CO2: 34 meq/L — AB (ref 19–32)
CREATININE: 1.24 mg/dL — AB (ref 0.50–1.10)
Chloride: 98 mEq/L (ref 96–112)
GFR calc Af Amer: 48 mL/min — ABNORMAL LOW (ref >90)
GFR, EST NON AFRICAN AMERICAN: 42 mL/min — AB (ref >90)
Glucose, Bld: 119 mg/dL — ABNORMAL HIGH (ref 70–99)
Potassium: 3.9 mEq/L (ref 3.7–5.3)
SODIUM: 143 meq/L (ref 137–147)

## 2014-04-10 LAB — GLUCOSE, CAPILLARY
GLUCOSE-CAPILLARY: 233 mg/dL — AB (ref 70–99)
Glucose-Capillary: 232 mg/dL — ABNORMAL HIGH (ref 70–99)
Glucose-Capillary: 121 mg/dL — ABNORMAL HIGH (ref 70–99)
Glucose-Capillary: 140 mg/dL — ABNORMAL HIGH (ref 70–99)
Glucose-Capillary: 202 mg/dL — ABNORMAL HIGH (ref 70–99)

## 2014-04-10 LAB — PHOSPHORUS: PHOSPHORUS: 3.4 mg/dL (ref 2.3–4.6)

## 2014-04-10 MED ORDER — BIOTENE DRY MOUTH MT LIQD
15.0000 mL | Freq: Two times a day (BID) | OROMUCOSAL | Status: DC
Start: 2014-04-10 — End: 2014-04-21
  Administered 2014-04-10 – 2014-04-21 (×17): 15 mL via OROMUCOSAL

## 2014-04-10 MED ORDER — IPRATROPIUM-ALBUTEROL 0.5-2.5 (3) MG/3ML IN SOLN
3.0000 mL | Freq: Three times a day (TID) | RESPIRATORY_TRACT | Status: DC
  Administered 2014-04-10 – 2014-04-13 (×11): 3 mL via RESPIRATORY_TRACT
  Filled 2014-04-10 (×11): qty 3

## 2014-04-10 MED ORDER — LISINOPRIL 2.5 MG PO TABS
2.5000 mg | ORAL_TABLET | Freq: Every day | ORAL | Status: DC
  Administered 2014-04-10 – 2014-04-16 (×6): 2.5 mg via ORAL
  Filled 2014-04-10 (×8): qty 1

## 2014-04-10 NOTE — Progress Notes (Signed)
ANTICOAGULATION CONSULT NOTE - Follow Up Consult  Pharmacy Consult for heparin Indication: chest pain/ACS  Labs:  Recent Labs  04/08/14 0423  04/09/14 0240 04/10/14 0138 04/10/14 1055 04/10/14 1900  HGB 9.1*  --  9.4* 9.3*  --   --   HCT 30.9*  --  32.3* 31.5*  --   --   PLT 236  --  253 217  --   --   LABPROT 14.7  --   --   --   --   --   INR 1.17  --   --   --   --   --   HEPARINUNFRC 0.27*  < > 0.35 0.25* 0.43 0.57  CREATININE 1.38*  --  1.33* 1.24*  --   --   < > = values in this interval not displayed.   Assessment: 73 yo F began on heparin IV gtt for ACS.  She is now s/p cath (TR band in place).  CVTS has recommended PCI option. Heparin level came back therapeutic again.    Goal of Therapy:  Heparin level 0.3-0.7 units/ml   Plan:  - continue heparin IV gtt at 1350 units/hr - daily HL and CBC  - monitor for s/s of bleeding

## 2014-04-10 NOTE — Progress Notes (Signed)
[facility] [place] [facility]  [name] SEG:[id number] DOB: 10-29-1941 DOA: 04/05/2014 PCP: Leola Brazil, MD  Time spent :  Brief narrative: 73 yo with COPD / reactive airway disease brought in [facility] ED in respiratory distress (presumed severe COPD exacerbation) on 5/9, intubated. Pt recently discharged  03/21/14 after treatment for GIB. OP colonoscopy planned. Pt did see Dr. Henrene Pastor as an OP on 5/6 and procedure planned for 5/20. At that OP visit pt was noted to have LE edema. During last admission ECHO (4/20) revealed combined systolic and diastolic HF with an EF of 35% hypokinetic RWMA. Cardiac enzymes had been cycled during the last admission and had been normal. Her EKG at that time had been nonischemic. She had had a normal Myoview Jan 2015.  This admission she developed chest pain and her enzymes were positive and peaked > 20. She also had ST segment elevation. Cardiology was consulted. ECHO was repeated this admission with stable EF. CXR c/w pulmonary edema requiring aggressive diuresis. Cardiac cath planned for 5/13.  HPI/Subjective: Pt endorsed abdominal bloating after eating- did start having regular BMs   Assessment/Plan: Active Problems:   Acute respiratory failure with hypoxia:   A) Acute exacerbation of chronic obstructive pulmonary disease (COPD)   B) Chronic combined systolic and diastolic CHF (congestive heart failure)   C) Right systolic dysfunction   D) Severe pulmonary hypertension 60 mmHg/moderate tricuspid regurgitation -stable on [place] oxygen -diuresis per cards -no evidence of COPD exacerbation or PNA so dc Levaquin -May be somewhat preload dependent so caution with diuresis-BP softer so may have reached end-point for diuresis-Cards managing -Apparently has progression of pericardial effusion-defer to cards      3V CAD/STEMI  -Cath 04/09/14 revealed severe three-vessel coronary artery disease likely best suited for surgical  intervention-cardiologist has consulted TCTS -Cont ASA, BB and IV Heparin for now    DIABETES-TYPE 2 -CBGs controlled -cont Lantus and SSI -was on Metformin at home- per last dc summary was supposed to stop Amaryl and begin Januvia    HYPERTENSION -moderate control -cont Coreg and Lasix    Constipation -XR 5/11 c/w constipation -began bowel regimen post cath with noted BMs afterwards -mg citrate x 1 bottle 5/13 -cont senokot    ANEMIA, IRON DEFICIENCY -Hgb stable    Psoriatic arthritis -cont Prednisone    GI bleeding -stable this admit despite IV Heparin -OP colonoscopy at discretion of GI given need for CABG and expected recovery time   DVT prophylaxis: IV heparin Code Status: Full Family Communication: No family at bedside Disposition Plan/Expected LOS: Step down   Consultants: PCCM Cardiology  Procedures: Echocardiogram - Left ventricle: The cavity size was mildly dilated. Wall thickness was normal. Systolic function was moderately reduced. The estimated ejection fraction was in the range of 35% to 40%. There is moderate hypokinesis of the mid-distalinferior myocardium. Doppler parameters are consistent with abnormal left ventricular relaxation (grade 1 diastolic dysfunction). - Mitral valve: Mild regurgitation. - Left atrium: The atrium was moderately dilated. - Right ventricle: The cavity size was mildly dilated. - Right atrium: The atrium was moderately dilated. - Atrial septum: No defect or patent foramen ovale was identified. - Tricuspid valve: Moderate regurgitation. - Pulmonary arteries: PA peak pressure: 32mm Hg (S). - Pericardium, extracardiac: A small to moderate, free-flowing pericardial effusion was identified. The fluid had no internal echoes.There was no evidence of hemodynamic compromise.  Cardiac catheterization pending  Antibiotics: Zosyn 5/9 >>>off  Vancomycin 5/9 >>> 5/10  Levaquin 5/9 >>> 5/13  CULTURES:  5/9 Blood >>>  5/9  MRSA >>> neg  Objective: Blood pressure 117/54, pulse 59, temperature 99.1 F (37.3 C), temperature source Oral, resp. rate 17, height 5' 4.17" (1.63 m), weight 167 lb 1.7 oz (75.8 kg), SpO2 100.00%.  Intake/Output Summary (Last 24 hours) at 04/10/14 1432 Last data filed at 04/10/14 1100  Gross per 24 hour  Intake  720.5 ml  Output   2450 ml  Net -1729.5 ml     Exam: General: No acute respiratory distress Lungs: Clear to auscultation bilaterally without wheezes or crackles, 2 L Cardiovascular: Regular rate and rhythm without murmur gallop or rub normal S1 and S2, no peripheral edema or JVD Abdomen: nontender, nondistended, soft, bowel sounds positive, no rebound, no ascites, no appreciable mass Musculoskeletal: No significant cyanosis, clubbing of bilateral lower extremities Neurological: Alert and oriented x 3, moves all extremities x 4 without focal neurological deficits, CN 2-12 intact  Scheduled Meds:  Scheduled Meds: . alum & mag hydroxide-simeth  15 mL Oral TID  . aspirin  325 mg Oral Daily  . atorvastatin  20 mg Per Tube q1800  . brimonidine  1 drop Right Eye TID   And  . brinzolamide  1 drop Right Eye TID  . budesonide  0.25 mg Nebulization BID  . carvedilol  6.25 mg Oral BID WC  . cycloSPORINE  1 drop Both Eyes BID  . furosemide  40 mg Intravenous BID  . insulin aspart  0-15 Units Subcutaneous TID AC & HS  . insulin glargine  10 Units Subcutaneous QHS  . ipratropium-albuterol  3 mL Nebulization TID  . latanoprost  1 drop Both Eyes QHS  . lisinopril  2.5 mg Oral Daily  . pantoprazole  40 mg Oral Q1200  . polyethylene glycol  17 g Oral BID  . predniSONE  40 mg Oral Q breakfast  . senna-docusate  2 tablet Oral BID   Continuous Infusions: . heparin 1,350 Units/hr (04/10/14 1206)  . nitroGLYCERIN Stopped (04/09/14 1816)    Data Reviewed: Basic Metabolic Panel:  Recent Labs Lab 04/06/14 0240  04/07/14 0257 04/07/14 1002 04/07/14 2248 04/08/14 0423  04/09/14 0240 04/10/14 0138  NA 137  --  140  --   --  140 145 143  K 4.6  --  4.6  --   --  4.7 4.1 3.9  CL 100  --  104  --   --  104 102 98  CO2 22  --  21  --   --  24 29 34*  GLUCOSE 266*  --  206*  --   --  179* 105* 119*  BUN 21  --  28*  --   --  30* 29* 29*  CREATININE 1.18*  --  1.50*  --   --  1.38* 1.33* 1.24*  CALCIUM 8.8  --  8.6  --   --  9.2 9.8 9.9  MG  --   --  2.2  --   --  1.9 1.7 2.0  PHOS  --   < > 3.7 3.1 3.9 4.7* 3.8 3.4  < > = values in this interval not displayed. Liver Function Tests:  Recent Labs Lab 04/05/14 0727  AST 82*  ALT 52*  ALKPHOS 93  BILITOT 0.3  PROT 6.1  ALBUMIN 2.9*    Recent Labs Lab 04/09/14 0240  LIPASE 28  AMYLASE 79   No results found for this basename: AMMONIA,  in the last  168 hours CBC:  Recent Labs Lab 04/05/14 0727 04/06/14 0240 04/07/14 0257 04/08/14 0423 04/09/14 0240 04/10/14 0138  WBC 14.8* 12.4* 17.8* 13.4* 11.8* 12.5*  NEUTROABS 9.8*  --   --  11.8* 8.7* 9.7*  HGB 8.1* 8.1* 7.6* 9.1* 9.4* 9.3*  HCT 29.5* 28.5* 26.8* 30.9* 32.3* 31.5*  MCV 80.6 77.7* 78.6 79.4 78.4 77.4*  PLT 323 231 237 236 253 217   Cardiac Enzymes:  Recent Labs Lab 04/05/14 0742 04/05/14 1135 04/05/14 1532 04/05/14 2210  TROPONINI <0.30 7.05* >20.00* >20.00*   BNP (last 3 results)  Recent Labs  04/05/14 0742 04/08/14 0423 04/08/14 1140  PROBNP 9378.0* 13832.0* 21317.0*   CBG:  Recent Labs Lab 04/09/14 1627 04/09/14 2101 04/09/14 2201 04/10/14 0744 04/10/14 1153  GLUCAP 138* 247* 232* 121* 140*    Recent Results (from the past 240 hour(s))  CULTURE, BLOOD (ROUTINE X 2)     Status: None   Collection Time    04/05/14 10:50 AM      Result Value Ref Range Status   Specimen Description BLOOD RIGHT HAND   Final   Special Requests BOTTLES DRAWN AEROBIC AND ANAEROBIC 5CC   Final   Culture  Setup Time     Final   Value: 04/05/2014 17:38     Performed at [facility]   Culture     Final   Value:         BLOOD CULTURE RECEIVED NO GROWTH TO DATE CULTURE WILL BE HELD FOR 5 DAYS BEFORE ISSUING A FINAL NEGATIVE REPORT     Performed at [facility]   Report Status PENDING   Incomplete  CULTURE, BLOOD (ROUTINE X 2)     Status: None   Collection Time    04/05/14 11:35 AM      Result Value Ref Range Status   Specimen Description BLOOD LEFT HAND   Final   Special Requests BOTTLES DRAWN AEROBIC AND ANAEROBIC 10CC   Final   Culture  Setup Time     Final   Value: 04/05/2014 17:38     Performed at [facility]   Culture     Final   Value:        BLOOD CULTURE RECEIVED NO GROWTH TO DATE CULTURE WILL BE HELD FOR 5 DAYS BEFORE ISSUING A FINAL NEGATIVE REPORT     Performed at [facility]   Report Status PENDING   Incomplete  MRSA PCR SCREENING     Status: None   Collection Time    04/05/14  2:44 PM      Result Value Ref Range Status   MRSA by PCR NEGATIVE  NEGATIVE Final   Comment:            The GeneXpert MRSA Assay (FDA     approved for NASAL specimens     only), is one component of a     comprehensive MRSA colonization     surveillance program. It is not     intended to diagnose MRSA     infection nor to guide or     monitor treatment for     MRSA infections.  CULTURE, RESPIRATORY (NON-EXPECTORATED)     Status: None   Collection Time    04/07/14 12:16 PM      Result Value Ref Range Status   Specimen Description TRACHEAL ASPIRATE   Final   Special Requests NONE   Final   Gram Stain     Final  Value: FEW WBC PRESENT,BOTH PMN AND MONONUCLEAR     NO SQUAMOUS EPITHELIAL CELLS SEEN     NO ORGANISMS SEEN     Performed at [facility]   Culture     Final   Value: NO GROWTH     Performed at [facility]   Report Status 04/09/2014 FINAL   Final     Studies:  Recent x-ray studies have been reviewed in detail by the Attending Physician       Erin Hearing, ANP [facility] Office  [phone] Pager [phone]  **Disclaimer: This  note may have been dictated with voice recognition software. Similar sounding words can inadvertently be transcribed and this note may contain transcription errors which may not have been corrected upon publication of note.**   **If unable to reach the above provider after paging please contact the Flow Manager @ [phone]  On-Call/Text Page:      [email or web address]      password [email or web address]  If 7PM-7AM, please contact night-coverage [email or web address] Password [email or web address] 04/10/2014, 2:32 PM   LOS: 5 days   Examined patient and discussed assessment and plan with ANP Ebony Hail and agree with plan.  Plan discussed with patient, family and all questions answered  Greater than 35 minutes was direct patient care

## 2014-04-10 NOTE — Progress Notes (Signed)
NUTRITION FOLLOW UP  Intervention:   1.  General healthful diet; encourage intake of foods and beverages as able.  RD to follow and assess for nutritional adequacy.  2.  Meals/snacks; initiate snacks BID. RD to order.   Nutrition Dx:   Inadequate oral intake, ongoing  Monitor:   1.  Food/Beverage; pt meeting >/=90% estimated needs with tolerance. 2.  Wt/wt change; monitor trends  Assessment:   Patient is a 73 yo with COPD / reactive airway disease brought in to [facility] ED in respiratory distress on 5/9, intubated.  Pt has been extubated and diet advanced to CHO Mod Medium. Pt meeting with cardiac rehab RN at time of visit.  PO documented as 10% of meal this AM.   Pt with diarrhea after getting several stool softeners while in ICU.  Question whether decreased intake r/t diarrhea.  Will enable snacks for pt vs supplements at this time and continue to follow for ongoing assistance in meeting needs.   Height: Ht Readings from Last 1 Encounters:  04/05/14 5' 4.17" (1.63 m)    Weight Status:   Wt Readings from Last 1 Encounters:  04/09/14 167 lb 1.7 oz (75.8 kg)    Re-estimated needs:  Kcal: 1450-1600 Protein: 66-77g Fluid: >1.5 L/day  Skin: intact  Diet Order: Carb Control   Intake/Output Summary (Last 24 hours) at 04/10/14 1024 Last data filed at 04/10/14 0800  Gross per 24 hour  Intake  535.1 ml  Output   3150 ml  Net -2614.9 ml    Last BM: 5/13  Labs:   Recent Labs Lab 04/08/14 0423 04/09/14 0240 04/10/14 0138  NA 140 145 143  K 4.7 4.1 3.9  CL 104 102 98  CO2 24 29 34*  BUN 30* 29* 29*  CREATININE 1.38* 1.33* 1.24*  CALCIUM 9.2 9.8 9.9  MG 1.9 1.7 2.0  PHOS 4.7* 3.8 3.4  GLUCOSE 179* 105* 119*    CBG (last 3)   Recent Labs  04/09/14 2101 04/09/14 2201 04/10/14 0744  GLUCAP 247* 232* 121*    Scheduled Meds: . alum & mag hydroxide-simeth  15 mL Oral TID  . aspirin  325 mg Oral Daily  . atorvastatin  20 mg Per Tube q1800  . brimonidine  1  drop Right Eye TID   And  . brinzolamide  1 drop Right Eye TID  . budesonide  0.25 mg Nebulization BID  . carvedilol  6.25 mg Oral BID WC  . cycloSPORINE  1 drop Both Eyes BID  . furosemide  40 mg Intravenous BID  . insulin aspart  0-15 Units Subcutaneous TID AC & HS  . insulin glargine  10 Units Subcutaneous QHS  . ipratropium-albuterol  3 mL Nebulization TID  . latanoprost  1 drop Both Eyes QHS  . lisinopril  2.5 mg Oral Daily  . pantoprazole  40 mg Oral Q1200  . polyethylene glycol  17 g Oral BID  . predniSONE  40 mg Oral Q breakfast  . senna-docusate  2 tablet Oral BID    Continuous Infusions: . sodium chloride 50 mL/hr at 04/09/14 1800  . heparin 1,200 Units/hr (04/09/14 1800)  . nitroGLYCERIN Stopped (04/09/14 1816)    Brynda Greathouse, MS RD LDN Clinical Inpatient Dietitian Pager: [phone] Weekend/After hours pager: [phone]

## 2014-04-10 NOTE — Plan of Care (Signed)
Problem: ICU Phase Progression Outcomes Goal: Flu/PneumoVaccines if indicated Outcome: Completed/Met Date Met:  04/10/14 Had shot few years ago; reaction: psoriasis flare up

## 2014-04-10 NOTE — Progress Notes (Signed)
CARDIAC REHAB PHASE I   PRE:  Rate/Rhythm:  75 SR with PVCs  BP:  Sitting: 136/62      SaO2: 100 2L  MODE:  To chair   POST:  Rate/Rhythm: 79 SR with PVCs  BP:  Sitting: 117/54     SaO2:100 2L 1130-1210 Pt refused to ambulate r/t constant stooling. Primary RN stated pt had constipation during acute ICU phase, and was given lost of stool softeners. Pt did agree to get into chair. Did well with transfer x 1 assist to manage foley bag and IV pole. Pt denied complaints of CP or SOB. Call bell and phone in reach. Ruweyda Macknight English PayneRN, BSN 04/10/2014 12:05 PM

## 2014-04-10 NOTE — Progress Notes (Signed)
SUBJECTIVE: 73 y.o. female with a h/o hypertension, combined chronic CHF (echocardiogram 03/09/2014 EF 35% with diffuse hypokinesis and grade 1 diastolic dysfunction), COPD, diabetes, with A1c 7.2% on 01/03/2013, peripheral arterial disease, pulmonary hypertension, GI bleed, esophageal candidiasis and rheumatoid arthritis on chronic steroids was admitted to the critical care service with acute respiratory failure requiring intubation for 3 days before self-extubation on 4/12 . Had Elevated troponins. EKG changes with increased ST segment elevations lead in V2, V3 and V4. These changes were present on her prior EKG of 03/17/2014 but are not more prominent.  [facility] 04/09/2014: >> Severe calcific 3 vessel coronary artery disease, involving the proximal and mid LAD most severely, the distal left main, the ostial circumflex, and the mid right coronary. 2) LVEDP 27 mmHg. Anatomy unfavorable for PCI and will be evaluated by TCTS.   Ms. Klumb was seen and examined at bedside this morning and she is doing well. Continues to complain of abdominal pain but she had a BM last night. She has no vomiting but has nausea. No chest pain. Diuresing well.   Marland Kitchen alum & mag hydroxide-simeth  15 mL Oral TID  . aspirin  325 mg Oral Daily  . atorvastatin  20 mg Per Tube q1800  . brimonidine  1 drop Right Eye TID   And  . brinzolamide  1 drop Right Eye TID  . budesonide  0.25 mg Nebulization BID  . carvedilol  6.25 mg Oral BID WC  . cycloSPORINE  1 drop Both Eyes BID  . furosemide  40 mg Intravenous BID  . insulin aspart  0-15 Units Subcutaneous TID AC & HS  . insulin glargine  10 Units Subcutaneous QHS  . ipratropium-albuterol  3 mL Nebulization TID  . latanoprost  1 drop Both Eyes QHS  . pantoprazole  40 mg Oral Q1200  . polyethylene glycol  17 g Oral BID  . predniSONE  40 mg Oral Q breakfast  . senna-docusate  2 tablet Oral BID   . sodium chloride 50 mL/hr at 04/09/14 1800  . heparin 1,200 Units/hr (04/09/14  1800)  . nitroGLYCERIN Stopped (04/09/14 1816)   OBJECTIVE: Physical Exam: Filed Vitals:   04/10/14 0400 04/10/14 0500 04/10/14 0727 04/10/14 0741  BP: 139/66 137/62  130/68  Pulse: 70 67  59  Temp: 98.7 F (37.1 C)   97.8 F (36.6 C)  TempSrc: Oral   Oral  Resp: 22 24  23   Height:      Weight:      SpO2: 93% 100% 100% 100%    Intake/Output Summary (Last 24 hours) at 04/10/14 0839 Last data filed at 04/10/14 0800  Gross per 24 hour  Intake  819.1 ml  Output   3650 ml  Net -2830.9 ml   General: lying in bed HEENT: EOMI Neck: JVP 10 cm Cardiovascular: RRR Lungs: b/l rales, mild expiratory wheeze Abdomen: mild upper abdominal tenderness, distended with voluntary guarding, +bs. Musculoskeletal: Moves all extremities, no edema, hyperpigmented, bilateral, lower extremities related to PVD. Left Midfoot amputation. Right radial cath site is without complications.  Neuro: alert, awake, and oriented  LABS: Basic Metabolic Panel:  Recent Labs  04/09/14 0240 04/10/14 0138  NA 145 143  K 4.1 3.9  CL 102 98  CO2 29 34*  GLUCOSE 105* 119*  BUN 29* 29*  CREATININE 1.33* 1.24*  CALCIUM 9.8 9.9  MG 1.7 2.0  PHOS 3.8 3.4   CBC:  Recent Labs  04/09/14 0240 04/10/14 0138  WBC 11.8* 12.5*  NEUTROABS 8.7* 9.7*  HGB 9.4* 9.3*  HCT 32.3* 31.5*  MCV 78.4 77.4*  PLT 253 217   RADIOLOGY: Dg Chest Port 1 View  04/05/2014   CLINICAL DATA:  Orogastric tube placement at bedside.  EXAM: PORTABLE CHEST - 1 VIEW  COMPARISON:  DG CHEST 1V PORT dated 04/05/2014; DG CHEST 1V PORT dated 03/16/2014; DG CHEST 1V PORT dated 12/01/2013; DG CHEST 1V PORT dated 11/29/2013  FINDINGS: Orogastric tube courses below the diaphragm into the stomach with its tip in the proximal body. Endotracheal tube tip is in the right mainstem bronchus. Cardiac silhouette enlarged. Interval improvement in the interstitial pulmonary edema since the examination earlier same date. Mild atelectasis in the lung bases, right  greater than left, likely related to suboptimal inspiration.  IMPRESSION: 1. OG tube tip in the proximal body of the stomach. 2. Endotracheal tube tip in the proximal right mainstem bronchus. This should be withdrawn 3-4 cm. 3. Improved interstitial pulmonary edema since the examination earlier today. 4. Mild bibasilar atelectasis, right greater than left, likely due to suboptimal inspiration. These results were called by telephone at the time of interpretation on 04/05/2014 at 4:36 PM to [facility], the nurse caring for the patient in the ICU, who verbally acknowledged these results.   Electronically Signed   By: Evangeline Dakin M.D.   On: 04/05/2014 16:38   Dg Chest Port 1 View  04/05/2014   CLINICAL DATA:  Respiratory distress, endotracheal tube placed  EXAM: PORTABLE CHEST - 1 VIEW  COMPARISON:  DG CHEST 1V PORT dated 03/16/2014; DG CHEST 1V PORT dated 12/01/2013; DG CHEST 1V PORT dated 11/29/2013  FINDINGS: Endotracheal tube tip is 1 cm above the carinal. There is moderate cardiac enlargement. There is vascular congestion. There is moderate interstitial prominence suggesting pulmonary edema, a change from prior study.  IMPRESSION: Pulmonary edema.  Endotracheal tube tip 1 cm above carina.   Electronically Signed   By: Skipper Cliche M.D.   On: 04/05/2014 08:07   Dg Chest Portable 1 View  03/16/2014   CLINICAL DATA:  Shortness of breath. Panic attack. COPD. Exertional asthma.  EXAM: PORTABLE CHEST - 1 VIEW  COMPARISON:  12/01/2013  FINDINGS: Mild to moderate cardiomegaly is stable. Both lungs remain clear. No evidence of pleural effusion. Mild tortuosity of thoracic aorta appears stable.  IMPRESSION: Stable cardiomegaly.  No acute findings.   Electronically Signed   By: Earle Gell M.D.   On: 03/16/2014 12:05   Dg Abd Portable 1v  03/16/2014   CLINICAL DATA:  Abdominal distention and discomfort.  EXAM: PORTABLE ABDOMEN - 1 VIEW  COMPARISON:  Abdominal radiograph 12/04/2013.  FINDINGS: Gas and stool are seen  scattered throughout the colon extending to the level of the distal rectum. No pathologic distension of small bowel is noted. No gross evidence of pneumoperitoneum.  IMPRESSION: 1.  Nonobstructive bowel gas pattern. 2. No pneumoperitoneum.   Electronically Signed   By: Vinnie Langton M.D.   On: 03/16/2014 17:04   ASSESSMENT AND PLAN:  Active Problems:   DIABETES-TYPE 2   ANEMIA, IRON DEFICIENCY   HYPERTENSION   Psoriatic arthritis   Acute exacerbation of chronic obstructive pulmonary disease (COPD)   GI bleeding   Chronic combined systolic and diastolic CHF (congestive heart failure)   Acute respiratory failure with hypoxia   STEMI (ST elevation myocardial infarction)   Constipation  NSTEMI : s/p cath. Troponin's trended up on admission to >20 on 04/05/14. EKG changes with increased ST segment elevations lead in  V2, V3 and V4 with t wave inversions in lateral leads that do not appear to be new. Echo 5/10 with EF 35-40%.  Catheterization revealed severe calcific 3 vessel coronary artery disease, involving the proximal and mid LAD most severely, the distal left main, the ostial circumflex, and the mid right coronary. 2) LVEDP 27 mmHg. Anatomy unfavorable for PCI and will be evaluated by TCTS. Plan  - will be evaluated by cardiothoracic for consideration of surgical revascularization - cont with carvedilol 6.25 bid  - on lipitor 20mg . Has h/o of statin intolerance - Continue lasix 40mg  BID - consider  ACE or ARB - Restart home hydralazine if needed for elevated BP  Acute on chronic combined CHF: proBNP elevated at 9000 with congestion on CXR. Repeat Echo with EF 35-40% with moderately reduced systolic function and grade 1 DD with moderate hypokinesis of mid-distal inferior myocardium.  Also has an increased pericardial effusion (small to moderate). Likely ischemic induced.  Plan  - Continue IV lasix with improving renal function - Monitor electrolytes and replace as needed  - consider  ACE/ARB  Abdominal Pain: Had a bowel movement. Likely due to constipation versus dyspepsia.  Plan  - received on dose of Mg citrate - cont with prn miralax and Senokot  Acute respiratory failure 2/2 ?PNA and/or pulm edema: Initially intubated for three days. Will complete 7 days of antibiotics with Levofloxacin renal dosed.  - on lasix IV as above - Blood cx x2 pending, NGTD, respiratory culture pending  Acute on chronic anemia (iron deficiency) with hx of3 GIB in past. Was supposed to have further outpatient work up but unclear if that has been done. Hx of prior blood transfusion. Baseline ~9.   Plan  - S/p 1 unit PRBC transfusion on 5/11 -continue to monitor -transfuse with goal Hb >8, currently 9.3  Acute on chronic renal failure, CKD 2--Cr trending down 1.5>>1.33>1.24. Baseline <1 -continue to trend -improved with diuresis  Hypertension: Initially hypotensive, now improved.  - continue BB, but still holding home hydralazine  DM2--Last A1C 7.2 in Feb 2014.  -SSI moderate and Lantus 10 qhs  Case discussed and patient seen with Dr. Aundra Dubin  Signed:  Jessee Avers, MD PGY-2 Internal Medicine Teaching Service Pager: [phone] 04/10/2014, 8:39 AM   Patient seen with resident, agree with the above note.  1. CAD: NSTEMI.  Severe 3VD on cath. No chest pain currently.  - CVTS to be consulted for evaluation for CABG.  Higher risk but I do not think prohibitive.  - Continue heparin gtt for now as well as ASA and statin.  2. Acute on chronic systolic CHF: She still has some volume overload on exam with elevated JVP.  Continue Lasix 40 mg IV bid today.  Continue current Coreg.  Will add low dose ACEI.  3. COPD: Acute exacerbation this admission. Intubated with AECOPD and acute CHF, now extubated.  4. Abdominal pain: Improved with BM.   Larey Dresser 04/10/2014 10:18 AM

## 2014-04-10 NOTE — Progress Notes (Signed)
ANTICOAGULATION CONSULT NOTE - Follow Up Consult  Pharmacy Consult for heparin Indication: chest pain/ACS  Labs:  Recent Labs  04/08/14 0423  04/09/14 0240 04/10/14 0138 04/10/14 1055  HGB 9.1*  --  9.4* 9.3*  --   HCT 30.9*  --  32.3* 31.5*  --   PLT 236  --  253 217  --   LABPROT 14.7  --   --   --   --   INR 1.17  --   --   --   --   HEPARINUNFRC 0.27*  < > 0.35 0.25* 0.43  CREATININE 1.38*  --  1.33* 1.24*  --   < > = values in this interval not displayed.   Assessment: 73 yo F began on heparin IV gtt for ACS.  She is now s/p cath (TR band in place) and plans for TCTS team evaluation for surgical intervention.  Pharmacy was consulted to restart heparin gtt s/p cath.  Heparin was restarted at 1200 units/hr with next HL subtherapeutic at 0.25, and rate was increased to 1350 units/hr.  HL now therapeutic at 0.43. H/H is low but stable and plt are wnl.  No reports of bleeding issues noted.     Goal of Therapy:  Heparin level 0.3-0.7 units/ml   Plan:  - continue heparin IV gtt at 1350 units/hr - draw 8h confirmatory HL - daily HL and CBC  - monitor for s/s of bleeding  Ovid Curd E. Jacqlyn Larsen, PharmD Clinical Pharmacist - Resident Pager: [phone] Pharmacy: [phone] 04/10/2014 12:04 PM

## 2014-04-10 NOTE — Progress Notes (Signed)
ANTICOAGULATION CONSULT NOTE - Follow Up Consult  Pharmacy Consult for heparin Indication: chest pain/ACS  Labs:  Recent Labs  04/08/14 0423 04/08/14 1834 04/09/14 0240 04/10/14 0138  HGB 9.1*  --  9.4* 9.3*  HCT 30.9*  --  32.3* 31.5*  PLT 236  --  253 217  LABPROT 14.7  --   --   --   INR 1.17  --   --   --   HEPARINUNFRC 0.27* 0.44 0.35 0.25*  CREATININE 1.38*  --  1.33* 1.24*     Assessment: 73 yo F began on heparin IV for severe 3v CAD. Plan for TCTS team evaluation for surgical intervention.  Heparin level 0.25 (subtherapeutic) on 1200 units/hr. No bleeding noted. CBC stable.  Goal of Therapy:  Heparin level 0.3-0.7 units/ml   Plan:  - Increase heparin to 1350 units/hr - F/u 8h Heparin level  Sherlon Handing, PharmD, BCPS Clinical pharmacist, pager [phone] 04/10/2014 2:28 AM

## 2014-04-10 NOTE — Consult Note (Signed)
[address]       [place] [address]             [phone]        Nai C Watchman [place] Medical Record #[id number] Date of Birth: [birth year]  Referring: No ref. provider found Primary Care: Leola Brazil, MD  Chief Complaint:    Chief Complaint  Patient presents with  . Shortness of Breath  . Asthma   Patient examined, coronary angiogram is in 2-D echo gram reviewed  History of Present Illness:     Chronically ill 73 year old female was admitted with acute respiratory failure/COPD flareup with positive cardiac enzymes non-ST elevation MI. Ejection fraction 35%. Coronary angiogram showing three-vessel CAD. Patient has been extubated. 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, hypertension, deconditioning and fragility   Current Activity/ Functional Status: Patient lives with her younger sister and is very sedentary   Zubrod Score: At the time of surgery this patient's most appropriate activity status/level should be described as: []     0    Normal activity, no symptoms []     1    Restricted in physical strenuous activity but ambulatory, able to do out light work []     2    Ambulatory and capable of self care, unable to do work activities, up and about                 more than 50%  Of the time                            [x]     3    Only limited self care, in bed greater than 50% of waking hours []     4    Completely disabled, no self care, confined to bed or chair []     5    Moribund  Past Medical History  Diagnosis Date  . COPD (chronic obstructive pulmonary disease)   . Hypertension   . Asthma   . Psoriatic arthritis   . Psoriasis   . GERD  (gastroesophageal reflux disease)   . Hiatal hernia   . Chronic sinus infection   . Heart murmur   . Blood transfusion ~ 1962    "11 units of blood"  . Ankylosing spondylitis   . Chronic back pain   . Dysrhythmia     "palpitations"  . Esophageal dilatation   . Candida esophagitis   . Cirrhosis of liver without mention of alcohol   . Hypercholesteremia   . Peripheral vascular disease   . Exertional dyspnea   . Pneumonia 10/07/11; 2006; 1980's  . Chronic bronchitis     "since childhood" (12/06/2012)  . Type II diabetes mellitus   . Iron deficiency anemia   . External bleeding hemorrhoids   . Sinus headache   . Arthritis     "psorasic"  . OA (osteoarthritis)   . DJD (degenerative joint disease) of knee     "both" (12/06/2012)  . Carpal tunnel syndrome on right   . Anxiety   . Barrett esophagus   . Non-healing ulcer of foot, secondary to diabetes and PVD 12/07/2012  . PVD (peripheral vascular disease) 12/07/2012  . Tobacco abuse 12/07/2012  .  Critical lower limb ischemia, with ulcer Lt foot 12/07/2012  . Gout   . COPD (chronic obstructive pulmonary disease)   . Carotid artery disease     dopplers showed mod L ICA stenosis   . PAD (peripheral artery disease)   . History of nuclear stress test 11/2012    lexiscan; normal, low risk   . Dermatophytosis of nail [id number]  . Psoriasis   . GI bleed   . Diverticulosis     Past Surgical History  Procedure Laterality Date  . Nasal sinus surgery  09/29/2009  . Carpal tunnel release  2011    left hand  . Lipoma excision  2010    "left occipital area"  . Cataract extraction w/ intraocular lens implant  2009/2010    right/left  . Eye surgery    . Oophorectomy      'not sure which one"  . Appendectomy    . Angioplasty  12/10/2012    "LLE"; LSFA [place] orbital rotational arthrectomy, PTA , stenting with IDEV stent (left ABI improved from 0.35 to 0.75)  . Abdominal hysterectomy  1976  . Breast biopsy  1960's    "think he did both"  (12/06/2012)  . Transmetatarsal amputation Left 01/08/2013    Procedure: TRANSMETATARSAL AMPUTATION;  Surgeon: Angelia Mould, MD;  Location: [facility];  Service: Vascular;  Laterality: Left;  . Esophagogastroduodenoscopy  2011  . Colonoscopy  2007    Hemorrhoids and Diverticulosis  . Esophagogastroduodenoscopy N/A 03/17/2014    Procedure: ESOPHAGOGASTRODUODENOSCOPY (EGD);  Surgeon: Irene Shipper, MD;  Location[facility] ENDOSCOPY;  Service: Endoscopy;  Laterality: N/A;    History  Smoking status  . Current Some Day Smoker -- 1.00 packs/day for 40 years  . Types: Cigarettes  Smokeless tobacco  . Never Used    Comment: pt states that she smokes "every once in a while"    History  Alcohol Use No    Comment: "quit social drinking years ago"    History   Social History  . Marital Status: Widowed    Spouse Name: N/A    Number of Children: 0  . Years of Education: N/A   Occupational History  . 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 Smoking status: Current Some Day Smoker -- 1.00 packs/day for 40 years    Types: Cigarettes  . Smokeless tobacco: Never Used     Comment: pt states that she smokes "every once in a while"  . Alcohol Use: No     Comment: "quit social drinking years ago"  . Drug Use: No  . Sexual Activity: No   Other Topics Concern  . Not on file   Social History Narrative  . No narrative on file    Allergies  Allergen Reactions  . Amlodipine Besy-Benazepril Hcl Other (See Comments)    Nervous/shakiness  . Statins Other (See Comments)    "can't take any of them; cramp me up" (12/06/2012)  . Tradjenta [Linagliptin] Other (See Comments)    Extreme muscle pains, chest and back pains  . Plavix [Clopidogrel] Rash    Pt states that the doctor told her that her rash was psoriasis and not related to plavix but she states the rash came from the plavix  . Raptiva [Efalizumab] Rash    Current Facility-Administered Medications    Medication Dose Route Frequency Provider Last Rate Last Dose  . alum & mag hydroxide-simeth (MAALOX/MYLANTA) 200-200-20 MG/5ML suspension 15 mL  15 mL Oral  TID Dow Adolph, MD      . aspirin tablet 325 mg  325 mg Oral Daily Lonia Farber, MD   325 mg at 04/10/14 0951  . atorvastatin (LIPITOR) tablet 20 mg  20 mg Per Tube q1800 Dow Adolph, MD   20 mg at 04/10/14 1745  . brimonidine (ALPHAGAN) 0.2 % ophthalmic solution 1 drop  1 drop Right Eye TID Lonia Farber, MD   1 drop at 04/10/14 1531   And  . brinzolamide (AZOPT) 1 % ophthalmic suspension 1 drop  1 drop Right Eye TID Lonia Farber, MD   1 drop at 04/10/14 1534  . budesonide (PULMICORT) nebulizer solution 0.25 mg  0.25 mg Nebulization BID Kalman Shan, MD   0.25 mg at 04/10/14 0725  . carvedilol (COREG) tablet 6.25 mg  6.25 mg Oral BID WC Dolores Patty, MD   6.25 mg at 04/10/14 0905  . cycloSPORINE (RESTASIS) 0.05 % ophthalmic emulsion 1 drop  1 drop Both Eyes BID Lonia Farber, MD   1 drop at 04/10/14 1007  . furosemide (LASIX) injection 40 mg  40 mg Intravenous BID Darden Palmer, MD   40 mg at 04/10/14 1848  . heparin ADULT infusion 100 units/mL (25000 units/250 mL)  1,350 Units/hr Intravenous Continuous Hilario Quarry Amend, RPH 13.5 mL/hr at 04/10/14 1206 1,350 Units/hr at 04/10/14 1206  . insulin aspart (novoLOG) injection 0-15 Units  0-15 Units Subcutaneous TID AC & HS Kalman Shan, MD   5 Units at 04/10/14 1746  . insulin glargine (LANTUS) injection 10 Units  10 Units Subcutaneous QHS Lonia Farber, MD   10 Units at 04/09/14 2159  . ipratropium-albuterol (DUONEB) 0.5-2.5 (3) MG/3ML nebulizer solution 3 mL  3 mL Nebulization Q4H PRN Lonia Farber, MD   3 mL at 04/06/14 2340  . ipratropium-albuterol (DUONEB) 0.5-2.5 (3) MG/3ML nebulizer solution 3 mL  3 mL Nebulization TID Drema Dallas, MD   3 mL at 04/10/14 1511  . latanoprost (XALATAN) 0.005 %  ophthalmic solution 1 drop  1 drop Both Eyes QHS Lonia Farber, MD   1 drop at 04/09/14 2152  . lisinopril (PRINIVIL,ZESTRIL) tablet 2.5 mg  2.5 mg Oral Daily Dow Adolph, MD   2.5 mg at 04/10/14 1157  . morphine 2 MG/ML injection 1-2 mg  1-2 mg Intravenous Q2H PRN Russella Dar, NP      . nitroGLYCERIN 0.2 mg/mL in dextrose 5 % infusion  10 mcg/min Intravenous Continuous Lyn Records III, MD   10 mcg/min at 04/09/14 1800  . pantoprazole (PROTONIX) EC tablet 40 mg  40 mg Oral Q1200 Kalman Shan, MD   40 mg at 04/10/14 1157  . polyethylene glycol (MIRALAX / GLYCOLAX) packet 17 g  17 g Oral BID Dow Adolph, MD   17 g at 04/10/14 0948  . predniSONE (DELTASONE) tablet 40 mg  40 mg Oral Q breakfast Kalman Shan, MD   40 mg at 04/10/14 0910  . senna-docusate (Senokot-S) tablet 2 tablet  2 tablet Oral BID Kalman Shan, MD   2 tablet at 04/09/14 7829    Prescriptions prior to admission  Medication Sig Dispense Refill  . albuterol (PROVENTIL HFA;VENTOLIN HFA) 108 (90 BASE) MCG/ACT inhaler Inhale 1 puff into the lungs every 6 (six) hours as needed. FOR SHORTNESS OF BREATH      . ALPRAZolam (XANAX) 0.5 MG tablet Take 1 tablet (0.5 mg total) by mouth 2 (two) times daily as needed for anxiety or sleep.  45 tablet  0  .  ammonium lactate (LAC-HYDRIN) 12 % lotion Apply 1 application topically as needed for dry skin (psorias).      . Brinzolamide-Brimonidine (SIMBRINZA) 1-0.2 % SUSP Place 1 drop into the right eye 3 (three) times daily.       . budesonide-formoterol (SYMBICORT) 160-4.5 MCG/ACT inhaler Inhale 2 puffs into the lungs every evening.      . Clobetasol Propionate (TEMOVATE) 0.05 % external spray Apply 1 spray topically 2 (two) times daily as needed. For psoriasis flare-ups      . esomeprazole (NEXIUM) 40 MG capsule Take 40 mg by mouth daily before breakfast.      . feeding supplement, GLUCERNA SHAKE, (GLUCERNA SHAKE) LIQD Take 237 mLs by mouth daily.       . ferrous  sulfate 325 (65 FE) MG tablet Take 1 tablet (325 mg total) by mouth 2 (two) times daily with a meal.  60 tablet  2  . fluticasone (FLONASE) 50 MCG/ACT nasal spray Place 2 sprays into both nostrils daily as needed for allergies.       . furosemide (LASIX) 40 MG tablet Take 20 mg by mouth daily.       Marland Kitchen ipratropium-albuterol (DUONEB) 0.5-2.5 (3) MG/3ML SOLN Take 3 mLs by nebulization every 4 (four) hours as needed (shortness of breath).      . Liniments ([employer] ARTHRITIS PAIN RELIEVER EX) Apply 1 application topically daily as needed (for shoulder pains).      . metFORMIN (GLUCOPHAGE) 500 MG tablet Take 500 mg by mouth 2 (two) times daily with a meal.      . Multiple Vitamins-Minerals (ICAPS) CAPS Take 1 capsule by mouth daily.      . naproxen sodium (ANAPROX) 220 MG tablet Take 220 mg by mouth daily.       . predniSONE (DELTASONE) 5 MG tablet Take 5 mg by mouth daily. On Monday, Wednesday, Friday       . ranitidine (ZANTAC) 150 MG capsule Take 150 mg by mouth at bedtime.        . RESTASIS 0.05 % ophthalmic emulsion Place 1 drop into both eyes 2 (two) times daily.       . SYMBICORT 160-4.5 MCG/ACT inhaler Inhale 2 puffs into the lungs 2 (two) times daily.       Marland Kitchen tiotropium (SPIRIVA) 18 MCG inhalation capsule Place 1 capsule (18 mcg total) into inhaler and inhale daily.  30 capsule  12  . Travoprost, BAK Free, (TRAVATAMN) 0.004 % SOLN ophthalmic solution Place 1 drop into both eyes at bedtime.       . simethicone (MYLICON) 80 MG chewable tablet Chew 1 tablet (80 mg total) by mouth 4 (four) times daily as needed for flatulence.  30 tablet  0    Family History  Problem Relation Age of Onset  . Lupus Sister     x2  . Lung cancer Sister   . Thyroid disease Sister   . Colon cancer Neg Hx   . Cancer Mother   . Heart Problems Maternal Grandmother   . Cancer Paternal Grandfather   . Diabetes Brother     x2  . Arthritis Brother     x3  . Arthritis Sister     x3     Review of Systems:  The  patient is right-hand dominant The patient denies previous cardiac catheterization or MI The patient has severe COPD but not on home oxygen Patient has diabetes and has developed in the past gangrenous toes requiring transmetatarsal amputation The patient denies  previous DVT or vein stripping The patient denies previous thoracic trauma or pneumothorax   Physical Exam: BP 101/43  Pulse 55  Temp(Src) 98.2 F (36.8 C) (Oral)  Resp 23  Ht 5\' 4"  (1.626 m)  Wt 162 lb 4.1 oz (73.6 kg)  BMI 27.84 kg/m2  SpO2 99%  Gen.--Elderly fragile female sitting in her chair in the CCU HEENT normocephalic pupils equal Neck-mild JVD no mass or adenopathy Chest-distant breath sounds scattered rhonchi Cardiac-sinus rhythm without murmur Abdomen soft without pulsatile mass Extremities peripheral rash or disease with atrophic skin changes left transmetatarsal amputation Neuro-alert and appropriate Diagnostic Studies & Laboratory data:     Recent Radiology Findings:   Dg Chest Port 1 View  04/09/2014   CLINICAL DATA:  Self extubation.  History of COPD.  EXAM: PORTABLE CHEST - 1 VIEW  COMPARISON:  04/08/2014  FINDINGS: Stable enlargement of the cardiac silhouette. There are persistent basilar densities and suspect pleural effusions. Upper lungs are clear. There may be some airspace disease in the right lower chest. Degenerative disease at the right glenohumeral joint. Trachea is midline.  IMPRESSION: Persistent basilar densities. Findings could represent a combination of pleural fluid and atelectasis. Cannot exclude asymmetric edema or airspace disease in the right lower chest.  Stable cardiomegaly.   Electronically Signed   By: Markus Daft M.D.   On: 04/09/2014 07:25      Recent Lab Findings: Lab Results  Component Value Date   WBC 12.5* 04/10/2014   HGB 9.3* 04/10/2014   HCT 31.5* 04/10/2014   PLT 217 04/10/2014   GLUCOSE 119* 04/10/2014   CHOL 153 04/06/2014   TRIG 50 04/06/2014   HDL 62 04/06/2014    LDLCALC 81 04/06/2014   ALT 52* 04/05/2014   AST 82* 04/05/2014   NA 143 04/10/2014   K 3.9 04/10/2014   CL 98 04/10/2014   CREATININE 1.24* 04/10/2014   BUN 29* 04/10/2014   CO2 34* 04/10/2014   TSH 0.456 11/26/2013   INR 1.17 04/08/2014   HGBA1C 7.2* 01/03/2013      Assessment / Plan:     This elderly patient with severe COPD and multi-system co- morbidities Would not do well after surgery and would probably not benefit. Consider PCI of her most critical coronary lesions versus medical therapy. This was discussed with patient and she understands.       @ME1 @ 04/10/2014 7:02 PM

## 2014-04-11 LAB — CULTURE, BLOOD (ROUTINE X 2)
Culture: NO GROWTH
Culture: NO GROWTH

## 2014-04-11 LAB — BASIC METABOLIC PANEL
BUN: 31 mg/dL — ABNORMAL HIGH (ref 6–23)
CALCIUM: 9.6 mg/dL (ref 8.4–10.5)
CO2: 30 mEq/L (ref 19–32)
CREATININE: 1.17 mg/dL — AB (ref 0.50–1.10)
Chloride: 98 mEq/L (ref 96–112)
GFR calc Af Amer: 52 mL/min — ABNORMAL LOW (ref >90)
GFR calc non Af Amer: 45 mL/min — ABNORMAL LOW (ref >90)
Glucose, Bld: 137 mg/dL — ABNORMAL HIGH (ref 70–99)
Potassium: 3.7 mEq/L (ref 3.7–5.3)
SODIUM: 139 meq/L (ref 137–147)

## 2014-04-11 LAB — CBC WITH DIFFERENTIAL/PLATELET
BASOS ABS: 0 10*3/uL (ref 0.0–0.1)
BASOS PCT: 0 % (ref 0–1)
EOS ABS: 0.1 10*3/uL (ref 0.0–0.7)
Eosinophils Relative: 1 % (ref 0–5)
HEMATOCRIT: 31.3 % — AB (ref 36.0–46.0)
Hemoglobin: 9.2 g/dL — ABNORMAL LOW (ref 12.0–15.0)
Lymphocytes Relative: 14 % (ref 12–46)
Lymphs Abs: 1.4 10*3/uL (ref 0.7–4.0)
MCH: 23.4 pg — AB (ref 26.0–34.0)
MCHC: 29.4 g/dL — AB (ref 30.0–36.0)
MCV: 79.6 fL (ref 78.0–100.0)
MONO ABS: 1.4 10*3/uL — AB (ref 0.1–1.0)
Monocytes Relative: 14 % — ABNORMAL HIGH (ref 3–12)
NEUTROS ABS: 7 10*3/uL (ref 1.7–7.7)
Neutrophils Relative %: 71 % (ref 43–77)
Platelets: 215 10*3/uL (ref 150–400)
RBC: 3.93 MIL/uL (ref 3.87–5.11)
RDW: 28.2 % — ABNORMAL HIGH (ref 11.5–15.5)
WBC: 9.9 10*3/uL (ref 4.0–10.5)

## 2014-04-11 LAB — HEPARIN LEVEL (UNFRACTIONATED): Heparin Unfractionated: 0.68 IU/mL (ref 0.30–0.70)

## 2014-04-11 LAB — GLUCOSE, CAPILLARY
GLUCOSE-CAPILLARY: 99 mg/dL (ref 70–99)
Glucose-Capillary: 106 mg/dL — ABNORMAL HIGH (ref 70–99)
Glucose-Capillary: 228 mg/dL — ABNORMAL HIGH (ref 70–99)

## 2014-04-11 LAB — MAGNESIUM: MAGNESIUM: 2.3 mg/dL (ref 1.5–2.5)

## 2014-04-11 LAB — PHOSPHORUS: PHOSPHORUS: 3.5 mg/dL (ref 2.3–4.6)

## 2014-04-11 MED ORDER — FUROSEMIDE 40 MG PO TABS
40.0000 mg | ORAL_TABLET | Freq: Every day | ORAL | Status: DC
  Administered 2014-04-12 – 2014-04-14 (×3): 40 mg via ORAL
  Filled 2014-04-11 (×4): qty 1

## 2014-04-11 MED ORDER — SODIUM CHLORIDE 0.9 % IV SOLN
INTRAVENOUS | Status: DC
  Administered 2014-04-14: 06:00:00 via INTRAVENOUS

## 2014-04-11 MED ORDER — ATORVASTATIN CALCIUM 20 MG PO TABS
20.0000 mg | ORAL_TABLET | Freq: Every day | ORAL | Status: DC
  Administered 2014-04-11 – 2014-04-20 (×10): 20 mg via ORAL
  Filled 2014-04-11 (×11): qty 1

## 2014-04-11 NOTE — Progress Notes (Signed)
SUBJECTIVE: 73 y.o. female with a h/o hypertension, combined chronic CHF (echocardiogram 03/09/2014 EF 35% with diffuse hypokinesis and grade 1 diastolic dysfunction), COPD, diabetes, with A1c 7.2% on 01/03/2013, peripheral arterial disease, pulmonary hypertension, GI bleed, esophageal candidiasis and rheumatoid arthritis on chronic steroids was admitted to the critical care service with acute respiratory failure requiring intubation for 3 days before self-extubation on 4/12 . Had Elevated troponins. EKG changes with increased ST segment elevations lead in V2, V3 and V4. These changes were present on her prior EKG of 03/17/2014 but are not more prominent.  [facility] 04/09/2014: >> Severe calcific 3 vessel coronary artery disease, involving the proximal and mid LAD most severely, the distal left main, the ostial circumflex, and the mid right coronary. 2) LVEDP 27 mmHg. Anatomy unfavorable for PCI and will be evaluated by TCTS.   She feels weak today but her abdominal pain is better. No chest pain, no SOB.   Marland Kitchen alum & mag hydroxide-simeth  15 mL Oral TID  . antiseptic oral rinse  15 mL Mouth Rinse BID  . aspirin  325 mg Oral Daily  . atorvastatin  20 mg Per Tube q1800  . brimonidine  1 drop Right Eye TID   And  . brinzolamide  1 drop Right Eye TID  . budesonide  0.25 mg Nebulization BID  . carvedilol  6.25 mg Oral BID WC  . cycloSPORINE  1 drop Both Eyes BID  . furosemide  40 mg Intravenous BID  . insulin aspart  0-15 Units Subcutaneous TID AC & HS  . insulin glargine  10 Units Subcutaneous QHS  . ipratropium-albuterol  3 mL Nebulization TID  . latanoprost  1 drop Both Eyes QHS  . lisinopril  2.5 mg Oral Daily  . pantoprazole  40 mg Oral Q1200  . polyethylene glycol  17 g Oral BID  . predniSONE  40 mg Oral Q breakfast  . senna-docusate  2 tablet Oral BID   . heparin 1,350 Units/hr (04/10/14 1206)  . nitroGLYCERIN Stopped (04/09/14 1816)   OBJECTIVE: Physical Exam: Filed Vitals:   04/11/14  0200 04/11/14 0400 04/11/14 0600 04/11/14 0741  BP: 116/43 118/59 130/72 121/64  Pulse:    103  Temp:  98.8 F (37.1 C)  98.5 F (36.9 C)  TempSrc:  Oral  Oral  Resp: 19 19 28 29   Height:      Weight:  163 lb 9.3 oz (74.2 kg)    SpO2:    99%    Intake/Output Summary (Last 24 hours) at 04/11/14 0748 Last data filed at 04/11/14 0600  Gross per 24 hour  Intake 1210.5 ml  Output   1051 ml  Net  159.5 ml   General: lying in bed, no acute distress.  HEENT: EOMI Neck: JVP 10 cm Cardiovascular: RRR Lungs: b/l rales, mild expiratory wheeze Abdomen: mild upper abdominal tenderness, distended with voluntary guarding, +bs. Musculoskeletal: Moves all extremities, no edema, hyperpigmented, bilateral, lower extremities related to PVD. Left Midfoot amputation.  Neuro: alert, awake, and oriented  LABS: Basic Metabolic Panel:  Recent Labs  04/10/14 0138 04/11/14 0500  NA 143 139  K 3.9 3.7  CL 98 98  CO2 34* 30  GLUCOSE 119* 137*  BUN 29* 31*  CREATININE 1.24* 1.17*  CALCIUM 9.9 9.6  MG 2.0 2.3  PHOS 3.4 3.5   CBC:  Recent Labs  04/10/14 0138 04/11/14 0500  WBC 12.5* 9.9  NEUTROABS 9.7* PENDING  HGB 9.3* 9.2*  HCT 31.5* 31.3*  MCV 77.4* 79.6  PLT 217 215   RADIOLOGY: Dg Chest Port 1 View  04/05/2014   CLINICAL DATA:  Orogastric tube placement at bedside.  EXAM: PORTABLE CHEST - 1 VIEW  COMPARISON:  DG CHEST 1V PORT dated 04/05/2014; DG CHEST 1V PORT dated 03/16/2014; DG CHEST 1V PORT dated 12/01/2013; DG CHEST 1V PORT dated 11/29/2013  FINDINGS: Orogastric tube courses below the diaphragm into the stomach with its tip in the proximal body. Endotracheal tube tip is in the right mainstem bronchus. Cardiac silhouette enlarged. Interval improvement in the interstitial pulmonary edema since the examination earlier same date. Mild atelectasis in the lung bases, right greater than left, likely related to suboptimal inspiration.  IMPRESSION: 1. OG tube tip in the proximal body of the  stomach. 2. Endotracheal tube tip in the proximal right mainstem bronchus. This should be withdrawn 3-4 cm. 3. Improved interstitial pulmonary edema since the examination earlier today. 4. Mild bibasilar atelectasis, right greater than left, likely due to suboptimal inspiration. These results were called by telephone at the time of interpretation on 04/05/2014 at 4:36 PM to [facility], the nurse caring for the patient in the ICU, who verbally acknowledged these results.   Electronically Signed   By: Evangeline Dakin M.D.   On: 04/05/2014 16:38   Dg Chest Port 1 View  04/05/2014   CLINICAL DATA:  Respiratory distress, endotracheal tube placed  EXAM: PORTABLE CHEST - 1 VIEW  COMPARISON:  DG CHEST 1V PORT dated 03/16/2014; DG CHEST 1V PORT dated 12/01/2013; DG CHEST 1V PORT dated 11/29/2013  FINDINGS: Endotracheal tube tip is 1 cm above the carinal. There is moderate cardiac enlargement. There is vascular congestion. There is moderate interstitial prominence suggesting pulmonary edema, a change from prior study.  IMPRESSION: Pulmonary edema.  Endotracheal tube tip 1 cm above carina.   Electronically Signed   By: Skipper Cliche M.D.   On: 04/05/2014 08:07   Dg Chest Portable 1 View  03/16/2014   CLINICAL DATA:  Shortness of breath. Panic attack. COPD. Exertional asthma.  EXAM: PORTABLE CHEST - 1 VIEW  COMPARISON:  12/01/2013  FINDINGS: Mild to moderate cardiomegaly is stable. Both lungs remain clear. No evidence of pleural effusion. Mild tortuosity of thoracic aorta appears stable.  IMPRESSION: Stable cardiomegaly.  No acute findings.   Electronically Signed   By: Earle Gell M.D.   On: 03/16/2014 12:05   Dg Abd Portable 1v  03/16/2014   CLINICAL DATA:  Abdominal distention and discomfort.  EXAM: PORTABLE ABDOMEN - 1 VIEW  COMPARISON:  Abdominal radiograph 12/04/2013.  FINDINGS: Gas and stool are seen scattered throughout the colon extending to the level of the distal rectum. No pathologic distension of small bowel is  noted. No gross evidence of pneumoperitoneum.  IMPRESSION: 1.  Nonobstructive bowel gas pattern. 2. No pneumoperitoneum.   Electronically Signed   By: Vinnie Langton M.D.   On: 03/16/2014 17:04   ASSESSMENT AND PLAN:  Active Problems:   DIABETES-TYPE 2   ANEMIA, IRON DEFICIENCY   HYPERTENSION   Psoriatic arthritis   Acute exacerbation of chronic obstructive pulmonary disease (COPD)   GI bleeding   Chronic combined systolic and diastolic CHF (congestive heart failure)   Acute respiratory failure with hypoxia   STEMI (ST elevation myocardial infarction)   Constipation   Pulmonary hypertension  NSTEMI : s/p cath. Troponin's trended up on admission to >20 on 04/05/14. EKG changes with increased ST segment elevations lead in V2, V3 and V4 with t wave inversions  in lateral leads that do not appear to be new. Echo 5/10 with EF 35-40%.  Catheterization revealed severe calcific 3 vessel coronary artery disease, involving the proximal and mid LAD most severely, the distal left main, the ostial circumflex, and the mid right coronary. 2) LVEDP 27 mmHg. Anatomy unfavorable for PCI and she is not a surgical candidate after evaluation by TCTS. Plan  - will have to consider medical therapy vs PCI - cont with carvedilol 6.25 bid  - on lipitor 20mg . Has h/o of statin intolerance - Continue lasix 40mg  BID. She is diuresing well. Will  back down on lasix since with low BP since starting BBL and ACEI - conti with lisinopril 2.5 mg  - hold home hydralazine for now.  Acute on chronic combined CHF: proBNP elevated at 9000 with congestion on CXR. Repeat Echo with EF 35-40% with moderately reduced systolic function and grade 1 DD with moderate hypokinesis of mid-distal inferior myocardium.  Also has an increased pericardial effusion (small to moderate). Likely ischemic induced.  Plan  - Continue IV lasix with improving renal function - Monitor electrolytes and replace as needed  - on BBL and ACEi  Abdominal  Pain: Had a bowel movement. Likely due to constipation versus dyspepsia.  Plan  - received on dose of Mg citrate with good results - cont with prn miralax and Senokot  Acute respiratory failure 2/2 ?PNA and/or pulm edema: Initially intubated for three days. Will complete 7 days of antibiotics with Levofloxacin renal dosed. Off antibiotics now  Acute on chronic anemia (iron deficiency) with hx of3 GIB in past. Stable Hgb. Hx of prior blood transfusion. Baseline ~9.   Plan  - S/p 1 unit PRBC transfusion on 5/11 -continue to monitor -transfuse with goal Hb >8, currently 9.3 - outpatient follow up   Acute on chronic renal failure, CKD 2--Cr trending down 1.5>>>1.17. Baseline <1 -continue to trend -improved with diuresis  Hypertension: Initially hypotensive, now improved.  - continue BBL and ACEi   DM2--Last A1C 7.2 in Feb 2014.  -SSI moderate and Lantus 10 qhs  Case discussed and patient seen with Dr. Haroldine Laws.  Signed:  Jessee Avers, MD PGY-2 Internal Medicine Teaching Service Pager: [phone] 04/11/2014, 7:48 AM   Patient seen and examined with  Dr. Alice Rieger. We discussed all aspects of the encounter. I agree with the assessment and plan as stated above. She is stable. TCTS has evaluated and felt not to be good candidate for CABG. I have presented the case to our interventional team and they are reviewing. Dr. Ellyn Hack feels that she may be candidate for possible high risk LM PCI with Impella support. He will d/w team and make final decision. HF currently stable. Can switch lasix to po.   Shaune Pascal Bensimhon,MD 2:55 PM

## 2014-04-11 NOTE — Progress Notes (Signed)
ANTICOAGULATION CONSULT NOTE - Follow Up Consult  Pharmacy Consult for heparin Indication: chest pain/ACS  Labs:  Recent Labs  04/09/14 0240 04/10/14 0138 04/10/14 1055 04/10/14 1900 04/11/14 0500  HGB 9.4* 9.3*  --   --  9.2*  HCT 32.3* 31.5*  --   --  31.3*  PLT 253 217  --   --  215  HEPARINUNFRC 0.35 0.25* 0.43 0.57 0.68  CREATININE 1.33* 1.24*  --   --  1.17*   Assessment: 73 yo F began on heparin IV gtt for ACS.  She is now s/p cath and TCTS team consulted and recommends PCI versus medical therapy.  Pharmacy still consulted to dose heparin gtt.  Current rate at 1350 units/hr.  Since then HL has trended up over last three draws,  and now still therapeutic but at upper range (0.68). H/H is low but stable and plt are wnl.  SCr decreased to 1.17 with CrCl ~42.  No reports of bleeding issues noted.     Goal of Therapy:  Heparin level 0.3-0.7 units/ml   Plan:  - decrease heparin IV gtt at 1300 units/hr - daily HL and CBC  - monitor for s/s of bleeding  Ovid Curd E. Jacqlyn Larsen, PharmD Clinical Pharmacist - Resident Pager: [phone] Pharmacy: [phone] 04/11/2014 8:25 AM

## 2014-04-11 NOTE — Progress Notes (Signed)
Report called to receiving Rn [id number] by Violeta Gelinas RN. Transfer via WC.

## 2014-04-11 NOTE — Progress Notes (Signed)
Came to see patient at bedside to explain and discuss [employer] services. Reports she lives with her sister and has been active with [facility]. Explained that [facility] will not interfere with home health services. She will receive post hospital discharge calls and will be evaluated for monthly home visits. Consents obtained. Left contact information at bedside. Will make inpatient RNCM aware.  Marthenia Rolling, MSN- [facility] [phone]

## 2014-04-11 NOTE — Progress Notes (Signed)
[facility]  [name] LJQ:[id number] DOB: Jul 25, 1941 DOA: 04/05/2014 PCP: [name], MD  Time spent : 35 mins  Brief narrative: 73 yo with COPD / reactive airway disease brought in [facility] ED in respiratory distress (presumed severe COPD exacerbation) on 5/9, intubated. Pt discharged 03/21/14 after treatment for GIB. OP colonoscopy planned. Pt did see Dr. Marina Goodell as an OP on 5/6 and procedure planned for 5/20. At that OP visit pt was noted to have LE edema. During last admission ECHO (4/20) revealed combined systolic and diastolic HF with an EF of 35% hypokinetic RWMA. Cardiac enzymes had been cycled during the last admission and had been essentially normal, and the pt had no c/o chest pain during that admission. She had had a normal Myoview Jan 2014.  This admission she developed c/o chest pain and her enzymes were positive and peaked > 20. She also had ST segment elevation. Cardiology was consulted. ECHO was repeated this admission with stable EF. CXR c/w pulmonary edema requiring aggressive diuresis.   HPI/Subjective: Pt endorsed less abdominal bloating after eating, no CP or SOB - OK with PCI if Cards offers.  Understands she is not a candidate for CABG.  Assessment/Plan:    Acute respiratory failure with hypoxia:   A) Acute exacerbation of chronic obstructive pulmonary disease (COPD)   B) Chronic combined systolic and diastolic CHF (congestive heart failure)   C) Right systolic dysfunction   D) Severe pulmonary hypertension 60 mmHg/moderate tricuspid regurgitation -stable on [place] oxygen -diuresis per cards -no evidence of COPD exacerbation or PNA so dc Levaquin      3V CAD/STEMI  -Cath 04/09/14 revealed severe diffuse three-vessel coronary artery disease-cardiologist consulted TCTS -deemed too high risk for CABG so plan is to pursue PCI with aggressive medical management -Cont ASA, BB and IV Heparin for now    DIABETES-TYPE 2 -CBGs  controlled -cont Lantus and SSI -was on Metformin at home- per last dc summary was supposed to stop Amaryl and begin Januvia    HYPERTENSION -moderate control -cont Coreg and Lasix -Cards adding ACE I today    Constipation -XR 5/11 c/w constipation -began bowel regimen post cath with noted BMs afterwards -cont senokot    ANEMIA, IRON DEFICIENCY -Hgb stable    Psoriatic arthritis -cont Prednisone    GI bleeding -stable this admit despite IV Heparin -OP colonoscopy at discretion of GI    DVT prophylaxis: IV heparin Code Status: Full Family Communication: No family at bedside Disposition Plan/Expected LOS: Transfer to Telemetry  Consultants: PCCM Cardiology  Procedures: Echocardiogram - Left ventricle: The cavity size was mildly dilated. Wall thickness was normal. Systolic function was moderately reduced. The estimated ejection fraction was in the range of 35% to 40%. There is moderate hypokinesis of the mid-distalinferior myocardium. Doppler parameters are consistent with abnormal left ventricular relaxation (grade 1 diastolic dysfunction). - Mitral valve: Mild regurgitation. - Left atrium: The atrium was moderately dilated. - Right ventricle: The cavity size was mildly dilated. - Right atrium: The atrium was moderately dilated. - Atrial septum: No defect or patent foramen ovale was identified. - Tricuspid valve: Moderate regurgitation. - Pulmonary arteries: PA peak pressure: 46mm Hg (S). - Pericardium, extracardiac: A small to moderate, free-flowing pericardial effusion was identified. The fluid had no internal echoes.There was no evidence of hemodynamic compromise.  Cardiac catheterization   Antibiotics: Zosyn 5/9 >>>off  Vancomycin 5/9 >>> 5/10  Levaquin 5/9 >>> 5/13  Objective: Blood pressure  92/47, pulse 74, temperature 98.7 F (37.1 C), temperature source Oral, resp. rate 21, height 5\' 4"  (1.626 m), weight 163 lb 9.3 oz (74.2 kg), SpO2  98.00%.  Intake/Output Summary (Last 24 hours) at 04/11/14 1308 Last data filed at 04/11/14 1100  Gross per 24 hour  Intake  814.5 ml  Output   1185 ml  Net -370.5 ml   Exam: General: No acute respiratory distress Lungs: Clear to auscultation bilaterally without wheezes or crackles, 2 L Cardiovascular: Regular rate and rhythm without murmur gallop or rub normal S1 and S2, no peripheral edema  Abdomen: nontender, nondistended, soft, bowel sounds positive, no rebound, no ascites, no appreciable mass Musculoskeletal: No significant cyanosis, clubbing of bilateral lower extremities Neurological: Alert and oriented x 3, moves all extremities x 4 without focal neurological deficits, CN 2-12 intact  Scheduled Meds:  Scheduled Meds: . alum & mag hydroxide-simeth  15 mL Oral TID  . antiseptic oral rinse  15 mL Mouth Rinse BID  . aspirin  325 mg Oral Daily  . atorvastatin  20 mg Per Tube q1800  . brimonidine  1 drop Right Eye TID   And  . brinzolamide  1 drop Right Eye TID  . budesonide  0.25 mg Nebulization BID  . carvedilol  6.25 mg Oral BID WC  . cycloSPORINE  1 drop Both Eyes BID  . furosemide  40 mg Intravenous BID  . insulin aspart  0-15 Units Subcutaneous TID AC & HS  . insulin glargine  10 Units Subcutaneous QHS  . ipratropium-albuterol  3 mL Nebulization TID  . latanoprost  1 drop Both Eyes QHS  . lisinopril  2.5 mg Oral Daily  . pantoprazole  40 mg Oral Q1200  . polyethylene glycol  17 g Oral BID  . predniSONE  40 mg Oral Q breakfast  . senna-docusate  2 tablet Oral BID   Continuous Infusions: . sodium chloride    . heparin 1,300 Units/hr (04/11/14 0841)  . nitroGLYCERIN Stopped (04/09/14 1816)    Data Reviewed: Basic Metabolic Panel:  Recent Labs Lab 04/07/14 0257  04/07/14 2248 04/08/14 0423 04/09/14 0240 04/10/14 0138 04/11/14 0500  NA 140  --   --  140 145 143 139  K 4.6  --   --  4.7 4.1 3.9 3.7  CL 104  --   --  104 102 98 98  CO2 21  --   --  24 29  34* 30  GLUCOSE 206*  --   --  179* 105* 119* 137*  BUN 28*  --   --  30* 29* 29* 31*  CREATININE 1.50*  --   --  1.38* 1.33* 1.24* 1.17*  CALCIUM 8.6  --   --  9.2 9.8 9.9 9.6  MG 2.2  --   --  1.9 1.7 2.0 2.3  PHOS 3.7  < > 3.9 4.7* 3.8 3.4 3.5  < > = values in this interval not displayed.  Liver Function Tests:  Recent Labs Lab 04/05/14 0727  AST 82*  ALT 52*  ALKPHOS 93  BILITOT 0.3  PROT 6.1  ALBUMIN 2.9*    Recent Labs Lab 04/09/14 0240  LIPASE 28  AMYLASE 79   CBC:  Recent Labs Lab 04/05/14 0727  04/07/14 0257 04/08/14 0423 04/09/14 0240 04/10/14 0138 04/11/14 0500  WBC 14.8*  < > 17.8* 13.4* 11.8* 12.5* 9.9  NEUTROABS 9.8*  --   --  11.8* 8.7* 9.7* 7.0  HGB 8.1*  < > 7.6*  9.1* 9.4* 9.3* 9.2*  HCT 29.5*  < > 26.8* 30.9* 32.3* 31.5* 31.3*  MCV 80.6  < > 78.6 79.4 78.4 77.4* 79.6  PLT 323  < > 237 236 253 217 215  < > = values in this interval not displayed.  Cardiac Enzymes:  Recent Labs Lab 04/05/14 0742 04/05/14 1135 04/05/14 1532 04/05/14 2210  TROPONINI <0.30 7.05* >20.00* >20.00*   BNP (last 3 results)  Recent Labs  04/05/14 0742 04/08/14 0423 04/08/14 1140  PROBNP 9378.0* 13832.0* 21317.0*   CBG:  Recent Labs Lab 04/10/14 1153 04/10/14 1624 04/10/14 2129 04/11/14 0754 04/11/14 1146  GLUCAP 140* 233* 202* 106* 99    Recent Results (from the past 240 hour(s))  CULTURE, BLOOD (ROUTINE X 2)     Status: None   Collection Time    04/05/14 10:50 AM      Result Value Ref Range Status   Specimen Description BLOOD RIGHT HAND   Final   Special Requests BOTTLES DRAWN AEROBIC AND ANAEROBIC 5CC   Final   Culture  Setup Time     Final   Value: 04/05/2014 17:38     Performed at [facility]   Culture     Final   Value: NO GROWTH 5 DAYS     Performed at [facility]   Report Status 04/11/2014 FINAL   Final  CULTURE, BLOOD (ROUTINE X 2)     Status: None   Collection Time    04/05/14 11:35 AM      Result Value Ref  Range Status   Specimen Description BLOOD LEFT HAND   Final   Special Requests BOTTLES DRAWN AEROBIC AND ANAEROBIC 10CC   Final   Culture  Setup Time     Final   Value: 04/05/2014 17:38     Performed at [facility]   Culture     Final   Value: NO GROWTH 5 DAYS     Performed at [facility]   Report Status 04/11/2014 FINAL   Final  MRSA PCR SCREENING     Status: None   Collection Time    04/05/14  2:44 PM      Result Value Ref Range Status   MRSA by PCR NEGATIVE  NEGATIVE Final   Comment:            The GeneXpert MRSA Assay (FDA     approved for NASAL specimens     only), is one component of a     comprehensive MRSA colonization     surveillance program. It is not     intended to diagnose MRSA     infection nor to guide or     monitor treatment for     MRSA infections.  CULTURE, RESPIRATORY (NON-EXPECTORATED)     Status: None   Collection Time    04/07/14 12:16 PM      Result Value Ref Range Status   Specimen Description TRACHEAL ASPIRATE   Final   Special Requests NONE   Final   Gram Stain     Final   Value: FEW WBC PRESENT,BOTH PMN AND MONONUCLEAR     NO SQUAMOUS EPITHELIAL CELLS SEEN     NO ORGANISMS SEEN     Performed at [facility]   Culture     Final   Value: NO GROWTH     Performed at [facility]   Report Status 04/09/2014 FINAL   Final     Studies:  Recent x-ray studies have been reviewed in detail by the Attending Physician  Erin Hearing, [place] [facility] Office  [phone] Pager [phone]  **If unable to reach the above provider after paging please contact the [place] @ [phone]  On-Call/Text Page:      [email or web address]      password [email or web address]  If 7PM-7AM, please contact night-coverage [email or web address] Password [email or web address] 04/11/2014, 1:08 PM   LOS: 6 days   I have personally examined this patient and reviewed the entire database. I have reviewed the above note, made any necessary editorial changes, and agree with  its content.  Cherene Altes, MD [facility]

## 2014-04-11 NOTE — Progress Notes (Signed)
CARDIAC REHAB PHASE I   PRE:  Rate/Rhythm: 64 SR    BP: sitting 90/44    SaO2: 97 2L  MODE:  Ambulation: 100 ft   POST:  Rate/Rhythm: 76 SR    BP: sitting 108/50     SaO2: 99 2L  Pt able to stand and walk with slow pace. Fairly steady. Used RW and gait belt, assist x2 and 2L O2. Denied problems or CP. Return to chair.Will continue to follow for light ambulation (will not push due to CAD and lack of decision on how to treat).  [phone]   [place], ACSM 04/11/2014 10:35 AM

## 2014-04-12 DIAGNOSIS — I200 Unstable angina: Secondary | ICD-10-CM

## 2014-04-12 DIAGNOSIS — D509 Iron deficiency anemia, unspecified: Secondary | ICD-10-CM

## 2014-04-12 DIAGNOSIS — K922 Gastrointestinal hemorrhage, unspecified: Secondary | ICD-10-CM

## 2014-04-12 DIAGNOSIS — E119 Type 2 diabetes mellitus without complications: Secondary | ICD-10-CM

## 2014-04-12 DIAGNOSIS — I5032 Chronic diastolic (congestive) heart failure: Secondary | ICD-10-CM

## 2014-04-12 LAB — CBC
HEMATOCRIT: 31.4 % — AB (ref 36.0–46.0)
HEMOGLOBIN: 9.1 g/dL — AB (ref 12.0–15.0)
MCH: 22.9 pg — ABNORMAL LOW (ref 26.0–34.0)
MCHC: 29 g/dL — AB (ref 30.0–36.0)
MCV: 78.9 fL (ref 78.0–100.0)
Platelets: 215 10*3/uL (ref 150–400)
RBC: 3.98 MIL/uL (ref 3.87–5.11)
RDW: 27.8 % — ABNORMAL HIGH (ref 11.5–15.5)
WBC: 10.6 10*3/uL — AB (ref 4.0–10.5)

## 2014-04-12 LAB — BASIC METABOLIC PANEL
BUN: 33 mg/dL — AB (ref 6–23)
CHLORIDE: 96 meq/L (ref 96–112)
CO2: 31 meq/L (ref 19–32)
Calcium: 9.5 mg/dL (ref 8.4–10.5)
Creatinine, Ser: 1.16 mg/dL — ABNORMAL HIGH (ref 0.50–1.10)
GFR calc Af Amer: 52 mL/min — ABNORMAL LOW (ref >90)
GFR calc non Af Amer: 45 mL/min — ABNORMAL LOW (ref >90)
Glucose, Bld: 158 mg/dL — ABNORMAL HIGH (ref 70–99)
Potassium: 3.7 mEq/L (ref 3.7–5.3)
SODIUM: 139 meq/L (ref 137–147)

## 2014-04-12 LAB — HEPARIN LEVEL (UNFRACTIONATED): HEPARIN UNFRACTIONATED: 0.67 [IU]/mL (ref 0.30–0.70)

## 2014-04-12 LAB — GLUCOSE, CAPILLARY
GLUCOSE-CAPILLARY: 304 mg/dL — AB (ref 70–99)
Glucose-Capillary: 120 mg/dL — ABNORMAL HIGH (ref 70–99)
Glucose-Capillary: 258 mg/dL — ABNORMAL HIGH (ref 70–99)
Glucose-Capillary: 187 mg/dL — ABNORMAL HIGH (ref 70–99)

## 2014-04-12 MED ORDER — PREDNISONE 20 MG PO TABS
20.0000 mg | ORAL_TABLET | Freq: Every day | ORAL | Status: DC
  Administered 2014-04-13 – 2014-04-21 (×9): 20 mg via ORAL
  Filled 2014-04-12 (×10): qty 1

## 2014-04-12 NOTE — Progress Notes (Signed)
ANTICOAGULATION CONSULT NOTE - Follow Up Consult  Pharmacy Consult for heparin Indication: chest pain/ACS  Labs:  Recent Labs  04/10/14 0138  04/10/14 1900 04/11/14 0500 04/12/14 0328  HGB 9.3*  --   --  9.2* 9.1*  HCT 31.5*  --   --  31.3* 31.4*  PLT 217  --   --  215 215  HEPARINUNFRC 0.25*  < > 0.57 0.68 0.67  CREATININE 1.24*  --   --  1.17* 1.16*  < > = values in this interval not displayed. Assessment: 73 yo F began on heparin IV gtt for ACS.  She is now s/p cath and TCTS team consulted and recommends PCI versus medical therapy.  Pharmacy still consulted to dose heparin gtt.  Current rate at 1350 units/hr.  Since then HL has trended up over last three draws,  and now still therapeutic but at upper range (0.6). H/H is low but stable and plt are wnl.  No reports of bleeding issues noted.     Goal of Therapy:  Heparin level 0.3-0.7 units/ml   Plan:  - decrease heparin IV gtt at 1200 units/hr - daily HL and CBC  - monitor for s/s of bleeding  Erin Hearing PharmD., BCPS Clinical Pharmacist Pager [phone] 04/12/2014 11:46 AM

## 2014-04-12 NOTE — Progress Notes (Signed)
Patient ID: Peyton Bottoms, female   DOB: 18-Feb-1941, 73 y.o.   MRN: [id number]     Subjective:   No chest pain   Objective:   Temp:  [98.5 F (36.9 C)-98.9 F (37.2 C)] 98.9 F (37.2 C) (05/16 0430) Pulse Rate:  [54-78] 54 (05/16 0430) Resp:  [18-24] 18 (05/16 0430) BP: (88-126)/(38-67) 120/64 mmHg (05/16 0430) SpO2:  [93 %-99 %] 99 % (05/16 0430) Weight:  [168 lb 9.6 oz (76.476 kg)] 168 lb 9.6 oz (76.476 kg) (05/16 0430) Last BM Date: 04/11/14  Filed Weights   04/10/14 1600 04/11/14 0400 04/12/14 0430  Weight: 162 lb 4.1 oz (73.6 kg) 163 lb 9.3 oz (74.2 kg) 168 lb 9.6 oz (76.476 kg)    Intake/Output Summary (Last 24 hours) at 04/12/14 0726 Last data filed at 04/12/14 0300  Gross per 24 hour  Intake    937 ml  Output   1036 ml  Net    -99 ml    Telemetry:  Exam:  General: NAD  Resp: CTAB  Cardiac: RRR, no m/r/g, no JVD  WN:UUVOZDG soft, NT  MSK: no LE edema  Neuro: no focal deficits  Psych: appropriate affect  Lab Results:  Basic Metabolic Panel:  Recent Labs Lab 04/09/14 0240 04/10/14 0138 04/11/14 0500 04/12/14 0328  NA 145 143 139 139  K 4.1 3.9 3.7 3.7  CL 102 98 98 96  CO2 29 34* 30 31  GLUCOSE 105* 119* 137* 158*  BUN 29* 29* 31* 33*  CREATININE 1.33* 1.24* 1.17* 1.16*  CALCIUM 9.8 9.9 9.6 9.5  MG 1.7 2.0 2.3  --     Liver Function Tests:  Recent Labs Lab 04/05/14 0727  AST 82*  ALT 52*  ALKPHOS 93  BILITOT 0.3  PROT 6.1  ALBUMIN 2.9*    CBC:  Recent Labs Lab 04/10/14 0138 04/11/14 0500 04/12/14 0328  WBC 12.5* 9.9 10.6*  HGB 9.3* 9.2* 9.1*  HCT 31.5* 31.3* 31.4*  MCV 77.4* 79.6 78.9  PLT 217 215 215    Cardiac Enzymes:  Recent Labs Lab 04/05/14 1135 04/05/14 1532 04/05/14 2210  TROPONINI 7.05* >20.00* >20.00*    BNP:  Recent Labs  04/05/14 0742 04/08/14 0423 04/08/14 1140  PROBNP 9378.0* 13832.0* 21317.0*    Coagulation:  Recent Labs Lab 04/08/14 0423  INR 1.17     ECG:   Medications:   Scheduled Medications: . alum & mag hydroxide-simeth  15 mL Oral TID  . antiseptic oral rinse  15 mL Mouth Rinse BID  . aspirin  325 mg Oral Daily  . atorvastatin  20 mg Oral q1800  . brimonidine  1 drop Right Eye TID   And  . brinzolamide  1 drop Right Eye TID  . budesonide  0.25 mg Nebulization BID  . carvedilol  6.25 mg Oral BID WC  . cycloSPORINE  1 drop Both Eyes BID  . furosemide  40 mg Oral Daily  . insulin aspart  0-15 Units Subcutaneous TID AC & HS  . insulin glargine  10 Units Subcutaneous QHS  . ipratropium-albuterol  3 mL Nebulization TID  . latanoprost  1 drop Both Eyes QHS  . lisinopril  2.5 mg Oral Daily  . pantoprazole  40 mg Oral Q1200  . polyethylene glycol  17 g Oral BID  . predniSONE  40 mg Oral Q breakfast  . senna-docusate  2 tablet Oral BID     Infusions: . sodium chloride    . heparin 1,300 Units/hr (04/12/14  0359)  . nitroGLYCERIN Stopped (04/09/14 1816)     PRN Medications:  ipratropium-albuterol, morphine injection     Assessment/Plan    73 yo female hx of HTN, systolic heart failure, COPD, DM, PAD, GI bleed, RA on chronic steroids admitted with respiratory failure. Found to have elevated troponins and increased ST segment elevations V2-V4.  1. NSTEMI - trop >20, LHC 04/09/14 with LM 50-60% with distal ulcerated plaque, LAD 95%, LCX 60%, RCA 60-85%. LVEF 35% by LV gram. - 04/06/14 echo LVEF 35-40%, grade I diastoilc dysfunction, hypokinesis of the mid-distalinferior myocardium, PASP 60.  - evaluated by CT surgery, thought to be too high risk given severe comorbidities including severe COPD.  - per Dr Nile Riggs note, case discussed with interventional cards Dr Ellyn Hack. Thought to be candidate for high risk pci but does not appear final decision has been made. It seems to be tentative possible for Monday - currently on ASA, statin, coreg, lisinopril, hep gtt. Continue current medical therapy, potential high risk  pci Monday pending final decision by interventionsl     Carlyle Dolly, M.D., F.A.C.C.

## 2014-04-12 NOTE — Progress Notes (Signed)
[facility] PROGRESS NOTE  [name] AYT:[id number] DOB: 06/27/1941 DOA: 04/05/2014 PCP: Leola Brazil, MD  Assessment/Plan: 1. CAD -cardiology consultation appreciated -planned for stenting on Monday -currently pain free -will continue heparin  2. COPD -stable presently -continue with oxygen and nebs  3. Diabetes Mellitus Type II -continue with lantus -monitor CBG-this morning noted to be elevated asymptomatic -decrease prednisone to 20 mg daily  4. Hypertension -continue home medications -under control presently  5. Anemia -Hgb stable  6. Psoriasis -continue with prednisone reduced dosage due to elevated glucose  7. GI bleed -monitor further bleeding  Code Status: Full Code Family Communication: No family in room (indicate person spoken with, relationship, and if by phone, the number) Disposition Plan: Home   Consultants:  CTS  Cardiology  Procedures:  Left heart cath  Intubation  Antibiotics:  None  HPI/Subjective: She is awake and alert comfortable. Denies having any chest pain presently. She has no shortness of breath noted.  Objective: Filed Vitals:   04/12/14 1029  BP: 117/64  Pulse: 58  Temp:   Resp:     Intake/Output Summary (Last 24 hours) at 04/12/14 1212 Last data filed at 04/12/14 0900  Gross per 24 hour  Intake    519 ml  Output    901 ml  Net   -382 ml   Filed Weights   04/10/14 1600 04/11/14 0400 04/12/14 0430  Weight: 73.6 kg (162 lb 4.1 oz) 74.2 kg (163 lb 9.3 oz) 76.476 kg (168 lb 9.6 oz)    Exam:   General:  Awake and alert no distress  Cardiovascular: RRR no gallop rubs  Respiratory: No ronchi or rales  Abdomen: soft non-tender  Musculoskeletal: no active synovitis  Data Reviewed: Basic Metabolic Panel:  Recent Labs Lab 04/07/14 0257  04/07/14 2248 04/08/14 0423 04/09/14 0240 04/10/14 0138 04/11/14 0500 04/12/14 0328  NA 140  --   --  140 145 143 139 139  K 4.6  --   --   4.7 4.1 3.9 3.7 3.7  CL 104  --   --  104 102 98 98 96  CO2 21  --   --  24 29 34* 30 31  GLUCOSE 206*  --   --  179* 105* 119* 137* 158*  BUN 28*  --   --  30* 29* 29* 31* 33*  CREATININE 1.50*  --   --  1.38* 1.33* 1.24* 1.17* 1.16*  CALCIUM 8.6  --   --  9.2 9.8 9.9 9.6 9.5  MG 2.2  --   --  1.9 1.7 2.0 2.3  --   PHOS 3.7  < > 3.9 4.7* 3.8 3.4 3.5  --   < > = values in this interval not displayed. Liver Function Tests: No results found for this basename: AST, ALT, ALKPHOS, BILITOT, PROT, ALBUMIN,  in the last 168 hours  Recent Labs Lab 04/09/14 0240  LIPASE 28  AMYLASE 79   No results found for this basename: AMMONIA,  in the last 168 hours CBC:  Recent Labs Lab 04/08/14 0423 04/09/14 0240 04/10/14 0138 04/11/14 0500 04/12/14 0328  WBC 13.4* 11.8* 12.5* 9.9 10.6*  NEUTROABS 11.8* 8.7* 9.7* 7.0  --   HGB 9.1* 9.4* 9.3* 9.2* 9.1*  HCT 30.9* 32.3* 31.5* 31.3* 31.4*  MCV 79.4 78.4 77.4* 79.6 78.9  PLT 236 253 217 215 215   Cardiac Enzymes:  Recent Labs Lab 04/05/14 1532 04/05/14 2210  TROPONINI >20.00* >20.00*   BNP (last  3 results)  Recent Labs  04/05/14 0742 04/08/14 0423 04/08/14 1140  PROBNP 9378.0* 13832.0* 21317.0*   CBG:  Recent Labs Lab 04/11/14 0754 04/11/14 1146 04/11/14 2136 04/12/14 0715 04/12/14 1129  GLUCAP 106* 99 228* 120* 258*    Recent Results (from the past 240 hour(s))  CULTURE, BLOOD (ROUTINE X 2)     Status: None   Collection Time    04/05/14 10:50 AM      Result Value Ref Range Status   Specimen Description BLOOD RIGHT HAND   Final   Special Requests BOTTLES DRAWN AEROBIC AND ANAEROBIC 5CC   Final   Culture  Setup Time     Final   Value: 04/05/2014 17:38     Performed at [facility]   Culture     Final   Value: NO GROWTH 5 DAYS     Performed at [facility]   Report Status 04/11/2014 FINAL   Final  CULTURE, BLOOD (ROUTINE X 2)     Status: None   Collection Time    04/05/14 11:35 AM      Result  Value Ref Range Status   Specimen Description BLOOD LEFT HAND   Final   Special Requests BOTTLES DRAWN AEROBIC AND ANAEROBIC 10CC   Final   Culture  Setup Time     Final   Value: 04/05/2014 17:38     Performed at [facility]   Culture     Final   Value: NO GROWTH 5 DAYS     Performed at [facility]   Report Status 04/11/2014 FINAL   Final  MRSA PCR SCREENING     Status: None   Collection Time    04/05/14  2:44 PM      Result Value Ref Range Status   MRSA by PCR NEGATIVE  NEGATIVE Final   Comment:            The GeneXpert MRSA Assay (FDA     approved for NASAL specimens     only), is one component of a     comprehensive MRSA colonization     surveillance program. It is not     intended to diagnose MRSA     infection nor to guide or     monitor treatment for     MRSA infections.  CULTURE, RESPIRATORY (NON-EXPECTORATED)     Status: None   Collection Time    04/07/14 12:16 PM      Result Value Ref Range Status   Specimen Description TRACHEAL ASPIRATE   Final   Special Requests NONE   Final   Gram Stain     Final   Value: FEW WBC PRESENT,BOTH PMN AND MONONUCLEAR     NO SQUAMOUS EPITHELIAL CELLS SEEN     NO ORGANISMS SEEN     Performed at [facility]   Culture     Final   Value: NO GROWTH     Performed at [facility]   Report Status 04/09/2014 FINAL   Final     Studies: No results found.  Scheduled Meds: . alum & mag hydroxide-simeth  15 mL Oral TID  . antiseptic oral rinse  15 mL Mouth Rinse BID  . aspirin  325 mg Oral Daily  . atorvastatin  20 mg Oral q1800  . brimonidine  1 drop Right Eye TID   And  . brinzolamide  1 drop Right Eye TID  . budesonide  0.25 mg Nebulization BID  .  carvedilol  6.25 mg Oral BID WC  . cycloSPORINE  1 drop Both Eyes BID  . furosemide  40 mg Oral Daily  . insulin aspart  0-15 Units Subcutaneous TID AC & HS  . insulin glargine  10 Units Subcutaneous QHS  . ipratropium-albuterol  3 mL Nebulization TID   . latanoprost  1 drop Both Eyes QHS  . lisinopril  2.5 mg Oral Daily  . pantoprazole  40 mg Oral Q1200  . polyethylene glycol  17 g Oral BID  . predniSONE  40 mg Oral Q breakfast  . senna-docusate  2 tablet Oral BID   Continuous Infusions: . sodium chloride    . heparin 1,200 Units/hr (04/12/14 1146)  . nitroGLYCERIN Stopped (04/09/14 1816)    Active Problems:   DIABETES-TYPE 2   ANEMIA, IRON DEFICIENCY   HYPERTENSION   Psoriatic arthritis   Acute exacerbation of chronic obstructive pulmonary disease (COPD)   GI bleeding   Chronic combined systolic and diastolic CHF (congestive heart failure)   Acute respiratory failure with hypoxia   STEMI (ST elevation myocardial infarction)   Constipation   Pulmonary hypertension    Time spent: 46min    Allyne Gee  [facility] Pager [phone]. If 7PM-7AM, please contact night-coverage at [email or web address], password [facility] 04/12/2014, 12:12 PM  LOS: 7 days

## 2014-04-12 NOTE — Progress Notes (Signed)
CARDIAC REHAB PHASE I   PRE:  Rate/Rhythm: 39 SB  BP:  Supine:   Sitting: 110/60  Standing:    SaO2: 100 2 L  MODE:  Ambulation: 250 ft   POST:  Rate/Rhythm: 85 SR  BP:  Supine:   Sitting: 126/70  Standing:    SaO2: 94 RA Tolerated ambulation well with rolling walker and assistance x2.  Can be assistance x 1.   Walked on room air and oxygen saturations maintained at 94%.  Reported to nurse and left oxygen off.  No angina.  Placed in recliner to eat lunch. [phone]  Liliane Channel RN, BSN 04/12/2014 12:10 PM

## 2014-04-13 DIAGNOSIS — I509 Heart failure, unspecified: Secondary | ICD-10-CM

## 2014-04-13 DIAGNOSIS — K922 Gastrointestinal hemorrhage, unspecified: Secondary | ICD-10-CM

## 2014-04-13 DIAGNOSIS — E119 Type 2 diabetes mellitus without complications: Secondary | ICD-10-CM

## 2014-04-13 DIAGNOSIS — I200 Unstable angina: Secondary | ICD-10-CM

## 2014-04-13 DIAGNOSIS — I5023 Acute on chronic systolic (congestive) heart failure: Secondary | ICD-10-CM

## 2014-04-13 LAB — BASIC METABOLIC PANEL
BUN: 27 mg/dL — ABNORMAL HIGH (ref 6–23)
CO2: 30 mEq/L (ref 19–32)
CREATININE: 0.98 mg/dL (ref 0.50–1.10)
Calcium: 9.3 mg/dL (ref 8.4–10.5)
Chloride: 98 mEq/L (ref 96–112)
GFR calc Af Amer: 64 mL/min — ABNORMAL LOW (ref >90)
GFR, EST NON AFRICAN AMERICAN: 55 mL/min — AB (ref >90)
Glucose, Bld: 107 mg/dL — ABNORMAL HIGH (ref 70–99)
Potassium: 3.8 mEq/L (ref 3.7–5.3)
SODIUM: 140 meq/L (ref 137–147)

## 2014-04-13 LAB — GLUCOSE, CAPILLARY
GLUCOSE-CAPILLARY: 118 mg/dL — AB (ref 70–99)
Glucose-Capillary: 213 mg/dL — ABNORMAL HIGH (ref 70–99)
Glucose-Capillary: 289 mg/dL — ABNORMAL HIGH (ref 70–99)
Glucose-Capillary: 229 mg/dL — ABNORMAL HIGH (ref 70–99)

## 2014-04-13 LAB — CBC
HCT: 32.6 % — ABNORMAL LOW (ref 36.0–46.0)
HEMOGLOBIN: 9.5 g/dL — AB (ref 12.0–15.0)
MCH: 23.2 pg — AB (ref 26.0–34.0)
MCHC: 29.1 g/dL — AB (ref 30.0–36.0)
MCV: 79.5 fL (ref 78.0–100.0)
Platelets: 195 10*3/uL (ref 150–400)
RBC: 4.1 MIL/uL (ref 3.87–5.11)
RDW: 27.4 % — ABNORMAL HIGH (ref 11.5–15.5)
WBC: 11.1 10*3/uL — ABNORMAL HIGH (ref 4.0–10.5)

## 2014-04-13 LAB — HEPARIN LEVEL (UNFRACTIONATED): Heparin Unfractionated: 0.62 IU/mL (ref 0.30–0.70)

## 2014-04-13 MED ORDER — ASPIRIN 81 MG PO CHEW
81.0000 mg | CHEWABLE_TABLET | ORAL | Status: AC
  Administered 2014-04-14: 81 mg via ORAL
  Filled 2014-04-13: qty 1

## 2014-04-13 MED ORDER — SODIUM CHLORIDE 0.9 % IV SOLN
INTRAVENOUS | Status: DC
  Administered 2014-04-14: 10 mL/h via INTRAVENOUS

## 2014-04-13 MED ORDER — INSULIN GLARGINE 100 UNIT/ML ~~LOC~~ SOLN
10.0000 [IU] | Freq: Every day | SUBCUTANEOUS | Status: DC
  Administered 2014-04-14 – 2014-04-20 (×7): 10 [IU] via SUBCUTANEOUS
  Filled 2014-04-13 (×8): qty 0.1

## 2014-04-13 MED ORDER — SODIUM CHLORIDE 0.9 % IJ SOLN
3.0000 mL | Freq: Two times a day (BID) | INTRAMUSCULAR | Status: DC
  Administered 2014-04-13 – 2014-04-15 (×4): 3 mL via INTRAVENOUS

## 2014-04-13 MED ORDER — SODIUM CHLORIDE 0.9 % IJ SOLN
3.0000 mL | INTRAMUSCULAR | Status: DC | PRN
Start: 2014-04-13 — End: 2014-04-16

## 2014-04-13 MED ORDER — SODIUM CHLORIDE 0.9 % IV SOLN: 250.0000 mL | INTRAVENOUS | Status: DC | PRN

## 2014-04-13 MED ORDER — CARVEDILOL 3.125 MG PO TABS
3.1250 mg | ORAL_TABLET | Freq: Two times a day (BID) | ORAL | Status: DC
  Administered 2014-04-13 – 2014-04-21 (×14): 3.125 mg via ORAL
  Filled 2014-04-13 (×20): qty 1

## 2014-04-13 MED ORDER — ALPRAZOLAM 0.25 MG PO TABS
0.2500 mg | ORAL_TABLET | Freq: Every evening | ORAL | Status: DC | PRN
  Administered 2014-04-14 – 2014-04-17 (×3): 0.25 mg via ORAL
  Filled 2014-04-13 (×3): qty 1

## 2014-04-13 MED ORDER — INSULIN GLARGINE 100 UNIT/ML ~~LOC~~ SOLN
5.0000 [IU] | Freq: Once | SUBCUTANEOUS | Status: AC
  Administered 2014-04-13: 5 [IU] via SUBCUTANEOUS
  Filled 2014-04-13: qty 0.05

## 2014-04-13 NOTE — Progress Notes (Deleted)
Subjective: Feels better today.  No CP or SOB.Marland Kitchen  Feels bloated.    Objective: Vital signs in last 24 hours: Temp:  [98.4 F (36.9 C)-98.9 F (37.2 C)] 98.4 F (36.9 C) (05/17 0507) Pulse Rate:  [55-68] 68 (05/17 1006) Resp:  [16-18] 18 (05/17 0507) BP: (111-118)/(54-64) 115/57 mmHg (05/17 1006) SpO2:  [92 %-99 %] 96 % (05/17 0825) Weight:  [169 lb 15.6 oz (77.1 kg)] 169 lb 15.6 oz (77.1 kg) (05/17 0507) Last BM Date: 04/12/14  Intake/Output from previous day: 05/16 0701 - 05/17 0700 In: 240 [P.O.:240] Out: 600 [Urine:600] Intake/Output this shift: Total I/O In: 252 [P.O.:240; I.V.:12] Out: -   Medications Current Facility-Administered Medications  Medication Dose Route Frequency Provider Last Rate Last Dose  . 0.9 %  sodium chloride infusion   Intravenous Continuous Allie Bossier, MD      . ALPRAZolam Duanne Moron) tablet 0.25 mg  0.25 mg Oral QHS PRN Allyne Gee, MD      . alum & mag hydroxide-simeth (MAALOX/MYLANTA) 200-200-20 MG/5ML suspension 15 mL  15 mL Oral TID Jessee Avers, MD   15 mL at 04/13/14 1010  . antiseptic oral rinse (BIOTENE) solution 15 mL  15 mL Mouth Rinse BID Allie Bossier, MD   15 mL at 04/13/14 0800  . aspirin tablet 325 mg  325 mg Oral Daily Doree Fudge, MD   325 mg at 04/13/14 1010  . atorvastatin (LIPITOR) tablet 20 mg  20 mg Oral q1800 Cherene Altes, MD   20 mg at 04/12/14 1611  . brimonidine (ALPHAGAN) 0.2 % ophthalmic solution 1 drop  1 drop Right Eye TID Doree Fudge, MD   1 drop at 04/13/14 1008   And  . brinzolamide (AZOPT) 1 % ophthalmic suspension 1 drop  1 drop Right Eye TID Doree Fudge, MD   1 drop at 04/13/14 1009  . budesonide (PULMICORT) nebulizer solution 0.25 mg  0.25 mg Nebulization BID Brand Males, MD   0.25 mg at 04/13/14 0942  . carvedilol (COREG) tablet 3.125 mg  3.125 mg Oral BID WC Arnoldo Lenis, MD   3.125 mg at 04/13/14 1008  . cycloSPORINE (RESTASIS) 0.05 % ophthalmic  emulsion 1 drop  1 drop Both Eyes BID Doree Fudge, MD   1 drop at 04/13/14 1009  . furosemide (LASIX) tablet 40 mg  40 mg Oral Daily Jolaine Artist, MD   40 mg at 04/13/14 1010  . heparin ADULT infusion 100 units/mL (25000 units/250 mL)  1,200 Units/hr Intravenous Continuous Georgina Peer, [facility] 12 mL/hr at 04/13/14 0143 1,200 Units/hr at 04/13/14 0143  . insulin aspart (novoLOG) injection 0-15 Units  0-15 Units Subcutaneous TID AC & HS Brand Males, MD   3 Units at 04/12/14 2221  . insulin glargine (LANTUS) injection 10 Units  10 Units Subcutaneous QHS Doree Fudge, MD   10 Units at 04/12/14 2221  . ipratropium-albuterol (DUONEB) 0.5-2.5 (3) MG/3ML nebulizer solution 3 mL  3 mL Nebulization Q4H PRN Doree Fudge, MD   3 mL at 04/06/14 2340  . ipratropium-albuterol (DUONEB) 0.5-2.5 (3) MG/3ML nebulizer solution 3 mL  3 mL Nebulization TID Allie Bossier, MD   3 mL at 04/13/14 0823  . latanoprost (XALATAN) 0.005 % ophthalmic solution 1 drop  1 drop Both Eyes QHS Doree Fudge, MD   1 drop at 04/12/14 2224  . lisinopril (PRINIVIL,ZESTRIL) tablet 2.5 mg  2.5 mg Oral Daily Jessee Avers, MD   2.5 mg at 04/13/14 1010  .  morphine 2 MG/ML injection 1-2 mg  1-2 mg Intravenous Q2H PRN Samella Parr, NP      . nitroGLYCERIN 0.2 mg/mL in dextrose 5 % infusion  10 mcg/min Intravenous Continuous Belva Crome III, MD   10 mcg/min at 04/09/14 1800  . pantoprazole (PROTONIX) EC tablet 40 mg  40 mg Oral Q1200 Brand Males, MD   40 mg at 04/12/14 1200  . polyethylene glycol (MIRALAX / GLYCOLAX) packet 17 g  17 g Oral BID Jessee Avers, MD   17 g at 04/11/14 2133  . predniSONE (DELTASONE) tablet 20 mg  20 mg Oral Q breakfast Allyne Gee, MD   20 mg at 04/13/14 0753  . senna-docusate (Senokot-S) tablet 2 tablet  2 tablet Oral BID Brand Males, MD   2 tablet at 04/11/14 2127    PE: General appearance: alert, cooperative and no distress Lungs:  Diffuse crackles, no wheezing Heart: regular rate and rhythm, S1, S2 normal, no murmur, click, rub or gallop Abdomen: +BS, nontender, appears distended Extremities: No LEE Pulses: 2+ radials Skin: WArm and dry Neurologic: Grossly normal  Lab Results:   Recent Labs  04/11/14 0500 04/12/14 0328 04/13/14 0758  WBC 9.9 10.6* 11.1*  HGB 9.2* 9.1* 9.5*  HCT 31.3* 31.4* 32.6*  PLT 215 215 195   BMET  Recent Labs  04/11/14 0500 04/12/14 0328 04/13/14 0758  NA 139 139 140  K 3.7 3.7 3.8  CL 98 96 98  CO2 30 31 30   GLUCOSE 137* 158* 107*  BUN 31* 33* 27*  CREATININE 1.17* 1.16* 0.98  CALCIUM 9.6 9.5 9.3     Assessment/Plan  73 y.o. female with a h/o hypertension, combined chronic CHF (echocardiogram 03/09/2014 EF 35% with diffuse hypokinesis and grade 1 diastolic dysfunction), COPD, diabetes, with A1c 7.2% on 01/03/2013, peripheral arterial disease, pulmonary hypertension, GI bleed, esophageal candidiasis and rheumatoid arthritis on chronic steroids was admitted to the critical care service with acute respiratory failure requiring intubation for 3 days before self-extubation on 4/12 . Had Elevated troponins. EKG changes with increased ST segment elevations lead in V2, V3 and V4. These changes were present on her prior EKG of 03/17/2014 but are not more prominent.   [facility] 04/09/2014: >> Severe calcific 3 vessel coronary artery disease, involving the proximal and mid LAD most severely, the distal left main, the ostial circumflex, and the mid right coronary. 2) LVEDP 27 mmHg.  Active Problems: Feels better today.  1. NSTEMI  - trop >20, LHC 04/09/14 with LM 50-60% with distal ulcerated plaque, LAD 95%, LCX 60%, RCA 60-85%. LVEF 35% by LV gram.  - 04/06/14 echo LVEF 35-40%, grade I diastoilc dysfunction, hypokinesis of the mid-distalinferior myocardium, PASP 60.  - evaluated by CT surgery, thought to be too high risk given severe comorbidities including severe COPD.  - per Dr  Nile Riggs note, case discussed with interventional cards Dr Ellyn Hack. Thought to be candidate for high risk pci but does not appear final decision has been made. It seems to be tentative possible for Monday  - currently on ASA, statin, coreg, lisinopril, hep gtt. Continue current medical therapy, potential high risk pci Monday pending final decision by interventional. Will defer plavix/effient/or brillinta loading to interventional team involved in her case.  - Pre-cath orders written. -SCr improved. 2. Anemia/GI bleed  - recent admit 02/2014 requiring 2 units pRBCs, FOBT + EGD normal, plans for outpatient colon that has not been completed yet  - stable Hgb.  Slightly improved since yesterday.  3. Fatigue  - Coreg decreased to 3.125 mg bid yesterday.     LOS: 8 days    Tarri Fuller PA-C 04/13/2014 10:13 AM

## 2014-04-13 NOTE — Progress Notes (Signed)
Patient ID: [name], female   DOB: Oct 04, 1941, 73 y.o.   MRN: [id number]   :  Subjective:    Mild generalized fatigue  Objective:   Temp:  [98.4 F (36.9 C)-98.9 F (37.2 C)] 98.4 F (36.9 C) (05/17 0507) Pulse Rate:  [55-68] 61 (05/17 0507) Resp:  [16-18] 18 (05/17 0507) BP: (111-118)/(54-64) 118/54 mmHg (05/17 0507) SpO2:  [92 %-99 %] 98 % (05/17 0507) Weight:  [169 lb 15.6 oz (77.1 kg)] 169 lb 15.6 oz (77.1 kg) (05/17 0507) Last BM Date: 04/12/14  Filed Weights   04/11/14 0400 04/12/14 0430 04/13/14 0507  Weight: 163 lb 9.3 oz (74.2 kg) 168 lb 9.6 oz (76.476 kg) 169 lb 15.6 oz (77.1 kg)    Intake/Output Summary (Last 24 hours) at 04/13/14 0734 Last data filed at 04/13/14 0143  Gross per 24 hour  Intake    240 ml  Output    600 ml  Net   -360 ml    Telemetry: sinus brady mid 50s  Exam:  General: NAD  Resp:CTAB  Cardiac: rate 55, no m/r/g, no jVD  JS:EGBTDVV soft, NT, ND  MSK:no LE edema  Neuro: no focal deficits  Psych: appropriate affect  Lab Results:  Basic Metabolic Panel:  Recent Labs Lab 04/09/14 0240 04/10/14 0138 04/11/14 0500 04/12/14 0328  NA 145 143 139 139  K 4.1 3.9 3.7 3.7  CL 102 98 98 96  CO2 29 34* 30 31  GLUCOSE 105* 119* 137* 158*  BUN 29* 29* 31* 33*  CREATININE 1.33* 1.24* 1.17* 1.16*  CALCIUM 9.8 9.9 9.6 9.5  MG 1.7 2.0 2.3  --     Liver Function Tests: No results found for this basename: AST, ALT, ALKPHOS, BILITOT, PROT, ALBUMIN,  in the last 168 hours  CBC:  Recent Labs Lab 04/10/14 0138 04/11/14 0500 04/12/14 0328  WBC 12.5* 9.9 10.6*  HGB 9.3* 9.2* 9.1*  HCT 31.5* 31.3* 31.4*  MCV 77.4* 79.6 78.9  PLT 217 215 215    Cardiac Enzymes: No results found for this basename: CKTOTAL, CKMB, CKMBINDEX, TROPONINI,  in the last 168 hours  BNP:  Recent Labs  04/05/14 0742 04/08/14 0423 04/08/14 1140  PROBNP 9378.0* 13832.0* 21317.0*    Coagulation:  Recent Labs Lab 04/08/14 0423  INR  1.17    ECG:   Medications:   Scheduled Medications: . alum & mag hydroxide-simeth  15 mL Oral TID  . antiseptic oral rinse  15 mL Mouth Rinse BID  . aspirin  325 mg Oral Daily  . atorvastatin  20 mg Oral q1800  . brimonidine  1 drop Right Eye TID   And  . brinzolamide  1 drop Right Eye TID  . budesonide  0.25 mg Nebulization BID  . carvedilol  6.25 mg Oral BID WC  . cycloSPORINE  1 drop Both Eyes BID  . furosemide  40 mg Oral Daily  . insulin aspart  0-15 Units Subcutaneous TID AC & HS  . insulin glargine  10 Units Subcutaneous QHS  . ipratropium-albuterol  3 mL Nebulization TID  . latanoprost  1 drop Both Eyes QHS  . lisinopril  2.5 mg Oral Daily  . pantoprazole  40 mg Oral Q1200  . polyethylene glycol  17 g Oral BID  . predniSONE  20 mg Oral Q breakfast  . senna-docusate  2 tablet Oral BID     Infusions: . sodium chloride    . heparin 1,200 Units/hr (04/13/14 0143)  . nitroGLYCERIN Stopped (  04/09/14 1816)     PRN Medications:  ipratropium-albuterol, morphine injection     Assessment/Plan    1. NSTEMI  - trop >20, LHC 04/09/14 with LM 50-60% with distal ulcerated plaque, LAD 95%, LCX 60%, RCA 60-85%. LVEF 35% by LV gram.  - 04/06/14 echo LVEF 35-40%, grade I diastoilc dysfunction, hypokinesis of the mid-distalinferior myocardium, PASP 60.  - evaluated by CT surgery, thought to be too high risk given severe comorbidities including severe COPD.  - per Dr Nile Riggs note, case discussed with interventional cards Dr Ellyn Hack. Thought to be candidate for high risk pci but does not appear final decision has been made. It seems to be tentative possible for Monday  - currently on ASA, statin, coreg, lisinopril, hep gtt. Continue current medical therapy, potential high risk pci Monday pending final decision by interventional. Will defer plavix/effient/or brillinta loading to interventional team involved in her case.    2. Anemia/GI bleed - recent admit 02/2014 requiring  2 units pRBCs, FOBT + EGD normal, plans for outpatient colon that has not been completed yet - stable Hgb since admission.  3. Fatigue - describes some generalized fatigue, noted heart rates sinus in mid 50s. Will decrease coreg to 3.125 mg bid   Carlyle Dolly, M.D., F.A.C.C.

## 2014-04-13 NOTE — Progress Notes (Signed)
ANTICOAGULATION CONSULT NOTE - Follow Up Consult  Pharmacy Consult for heparin Indication: chest pain/ACS  Labs:  Recent Labs  04/11/14 0500 04/12/14 0328 04/13/14 0758  HGB 9.2* 9.1* 9.5*  HCT 31.3* 31.4* 32.6*  PLT 215 215 195  HEPARINUNFRC 0.68 0.67 0.62  CREATININE 1.17* 1.16* 0.98   Assessment: 73 yo F began on heparin IV gtt for ACS.  She is now s/p cath and TCTS team consulted and recommends PCI versus medical therapy.  Pharmacy still consulted to dose heparin gtt.  Current rate at 1200 units/hr and still therapeutic but at upper range (0.6). H/H is low but stable and plt are wnl. No reports of bleeding issues noted. No decision yet on high risk pci. Continue heparin for now.  Goal of Therapy:  Heparin level 0.3-0.7 units/ml   Plan:  - Continue heparin IV gtt at 1200 units/hr - daily HL and CBC  - monitor for s/s of bleeding - follow up cath plan  Erin Hearing PharmD., BCPS Clinical Pharmacist Pager [phone] 04/13/2014 10:50 AM

## 2014-04-13 NOTE — Progress Notes (Signed)
[facility] PROGRESS NOTE  [name] GEX:[id number] DOB: November 08, 1941 DOA: 04/05/2014 PCP: Leola Brazil, MD  Assessment/Plan: 1. CAD -cardiology note appreciated -Await interventional team input on monday -currently pain free -will continue heparin  2. COPD -stable presently -continue with oxygen and nebs as needed  3. Diabetes Mellitus Type II -continue with lantus -monitor CBG improved glucose control after decreasing prednisone to 20 mg daily  4. Hypertension -continue home medications -under control presently  5. Anemia -Hgb stable  6. Psoriasis -continue with prednisone reduced dosage due to elevated glucose  7. GI bleed -monitor further bleeding  Code Status: Full Code Family Communication: No family in room (indicate person spoken with, relationship, and if by phone, the number) Disposition Plan: Home   Consultants:  CTS  Cardiology  Procedures:  Left heart cath  Intubation  Antibiotics:  None  HPI/Subjective: She is awake and alert comfortable. Denies having any chest pain presently. Glucose this am looked better.  Objective: Filed Vitals:   04/13/14 0507  BP: 118/54  Pulse: 61  Temp: 98.4 F (36.9 C)  Resp: 18    Intake/Output Summary (Last 24 hours) at 04/13/14 0937 Last data filed at 04/13/14 0143  Gross per 24 hour  Intake      0 ml  Output    600 ml  Net   -600 ml   Filed Weights   04/11/14 0400 04/12/14 0430 04/13/14 0507  Weight: 74.2 kg (163 lb 9.3 oz) 76.476 kg (168 lb 9.6 oz) 77.1 kg (169 lb 15.6 oz)    Exam:   General:  Awake and alert no distress  Cardiovascular: RRR no gallop rubs  Respiratory: No ronchi or rales  Abdomen: soft non-tender  Musculoskeletal: no active synovitis  Data Reviewed: Basic Metabolic Panel:  Recent Labs Lab 04/07/14 0257  04/07/14 2248 04/08/14 0423 04/09/14 0240 04/10/14 0138 04/11/14 0500 04/12/14 0328 04/13/14 0758  NA 140  --   --  140 145 143  139 139 140  K 4.6  --   --  4.7 4.1 3.9 3.7 3.7 3.8  CL 104  --   --  104 102 98 98 96 98  CO2 21  --   --  24 29 34* 30 31 30   GLUCOSE 206*  --   --  179* 105* 119* 137* 158* 107*  BUN 28*  --   --  30* 29* 29* 31* 33* 27*  CREATININE 1.50*  --   --  1.38* 1.33* 1.24* 1.17* 1.16* 0.98  CALCIUM 8.6  --   --  9.2 9.8 9.9 9.6 9.5 9.3  MG 2.2  --   --  1.9 1.7 2.0 2.3  --   --   PHOS 3.7  < > 3.9 4.7* 3.8 3.4 3.5  --   --   < > = values in this interval not displayed. Liver Function Tests: No results found for this basename: AST, ALT, ALKPHOS, BILITOT, PROT, ALBUMIN,  in the last 168 hours  Recent Labs Lab 04/09/14 0240  LIPASE 28  AMYLASE 79   No results found for this basename: AMMONIA,  in the last 168 hours CBC:  Recent Labs Lab 04/08/14 0423 04/09/14 0240 04/10/14 0138 04/11/14 0500 04/12/14 0328 04/13/14 0758  WBC 13.4* 11.8* 12.5* 9.9 10.6* 11.1*  NEUTROABS 11.8* 8.7* 9.7* 7.0  --   --   HGB 9.1* 9.4* 9.3* 9.2* 9.1* 9.5*  HCT 30.9* 32.3* 31.5* 31.3* 31.4* 32.6*  MCV 79.4 78.4 77.4* 79.6  78.9 79.5  PLT 236 253 217 215 215 195   Cardiac Enzymes: No results found for this basename: CKTOTAL, CKMB, CKMBINDEX, TROPONINI,  in the last 168 hours BNP (last 3 results)  Recent Labs  04/05/14 0742 04/08/14 0423 04/08/14 1140  PROBNP 9378.0* 13832.0* 21317.0*   CBG:  Recent Labs Lab 04/12/14 0715 04/12/14 1129 04/12/14 1638 04/12/14 2134 04/13/14 0627  GLUCAP 120* 258* 304* 187* 118*    Recent Results (from the past 240 hour(s))  CULTURE, BLOOD (ROUTINE X 2)     Status: None   Collection Time    04/05/14 10:50 AM      Result Value Ref Range Status   Specimen Description BLOOD RIGHT HAND   Final   Special Requests BOTTLES DRAWN AEROBIC AND ANAEROBIC 5CC   Final   Culture  Setup Time     Final   Value: 04/05/2014 17:38     Performed at [facility]   Culture     Final   Value: NO GROWTH 5 DAYS     Performed at [facility]   Report  Status 04/11/2014 FINAL   Final  CULTURE, BLOOD (ROUTINE X 2)     Status: None   Collection Time    04/05/14 11:35 AM      Result Value Ref Range Status   Specimen Description BLOOD LEFT HAND   Final   Special Requests BOTTLES DRAWN AEROBIC AND ANAEROBIC 10CC   Final   Culture  Setup Time     Final   Value: 04/05/2014 17:38     Performed at [facility]   Culture     Final   Value: NO GROWTH 5 DAYS     Performed at [facility]   Report Status 04/11/2014 FINAL   Final  MRSA PCR SCREENING     Status: None   Collection Time    04/05/14  2:44 PM      Result Value Ref Range Status   MRSA by PCR NEGATIVE  NEGATIVE Final   Comment:            The GeneXpert MRSA Assay (FDA     approved for NASAL specimens     only), is one component of a     comprehensive MRSA colonization     surveillance program. It is not     intended to diagnose MRSA     infection nor to guide or     monitor treatment for     MRSA infections.  CULTURE, RESPIRATORY (NON-EXPECTORATED)     Status: None   Collection Time    04/07/14 12:16 PM      Result Value Ref Range Status   Specimen Description TRACHEAL ASPIRATE   Final   Special Requests NONE   Final   Gram Stain     Final   Value: FEW WBC PRESENT,BOTH PMN AND MONONUCLEAR     NO SQUAMOUS EPITHELIAL CELLS SEEN     NO ORGANISMS SEEN     Performed at [facility]   Culture     Final   Value: NO GROWTH     Performed at [facility]   Report Status 04/09/2014 FINAL   Final     Studies: No results found.  Scheduled Meds: . alum & mag hydroxide-simeth  15 mL Oral TID  . antiseptic oral rinse  15 mL Mouth Rinse BID  . aspirin  325 mg Oral Daily  . atorvastatin  20 mg  Oral q1800  . brimonidine  1 drop Right Eye TID   And  . brinzolamide  1 drop Right Eye TID  . budesonide  0.25 mg Nebulization BID  . carvedilol  3.125 mg Oral BID WC  . cycloSPORINE  1 drop Both Eyes BID  . furosemide  40 mg Oral Daily  . insulin aspart   0-15 Units Subcutaneous TID AC & HS  . insulin glargine  10 Units Subcutaneous QHS  . ipratropium-albuterol  3 mL Nebulization TID  . latanoprost  1 drop Both Eyes QHS  . lisinopril  2.5 mg Oral Daily  . pantoprazole  40 mg Oral Q1200  . polyethylene glycol  17 g Oral BID  . predniSONE  20 mg Oral Q breakfast  . senna-docusate  2 tablet Oral BID   Continuous Infusions: . sodium chloride    . heparin 1,200 Units/hr (04/13/14 0143)  . nitroGLYCERIN Stopped (04/09/14 1816)    Active Problems:   DIABETES-TYPE 2   ANEMIA, IRON DEFICIENCY   HYPERTENSION   Psoriatic arthritis   Acute exacerbation of chronic obstructive pulmonary disease (COPD)   GI bleeding   Chronic combined systolic and diastolic CHF (congestive heart failure)   Acute respiratory failure with hypoxia   STEMI (ST elevation myocardial infarction)   Constipation   Pulmonary hypertension    Time spent: 11min    Allyne Gee  [facility] Pager [phone]. If 7PM-7AM, please contact night-coverage at [email or web address], password [facility] 04/13/2014, 9:37 AM  LOS: 8 days

## 2014-04-14 ENCOUNTER — Inpatient Hospital Stay (HOSPITAL_COMMUNITY): Payer: Medicare Other

## 2014-04-14 DIAGNOSIS — R0789 Other chest pain: Secondary | ICD-10-CM

## 2014-04-14 LAB — BASIC METABOLIC PANEL
BUN: 25 mg/dL — AB (ref 6–23)
CO2: 32 meq/L (ref 19–32)
Calcium: 9.1 mg/dL (ref 8.4–10.5)
Chloride: 98 mEq/L (ref 96–112)
Creatinine, Ser: 1.07 mg/dL (ref 0.50–1.10)
GFR calc Af Amer: 58 mL/min — ABNORMAL LOW (ref >90)
GFR calc non Af Amer: 50 mL/min — ABNORMAL LOW (ref >90)
Glucose, Bld: 123 mg/dL — ABNORMAL HIGH (ref 70–99)
Potassium: 3.7 mEq/L (ref 3.7–5.3)
Sodium: 139 mEq/L (ref 137–147)

## 2014-04-14 LAB — CBC
HCT: 29.9 % — ABNORMAL LOW (ref 36.0–46.0)
HEMOGLOBIN: 8.8 g/dL — AB (ref 12.0–15.0)
MCH: 23.2 pg — AB (ref 26.0–34.0)
MCHC: 29.4 g/dL — ABNORMAL LOW (ref 30.0–36.0)
MCV: 78.7 fL (ref 78.0–100.0)
PLATELETS: 197 10*3/uL (ref 150–400)
RBC: 3.8 MIL/uL — AB (ref 3.87–5.11)
RDW: 27.4 % — ABNORMAL HIGH (ref 11.5–15.5)
WBC: 11.2 10*3/uL — AB (ref 4.0–10.5)

## 2014-04-14 LAB — GLUCOSE, CAPILLARY
GLUCOSE-CAPILLARY: 139 mg/dL — AB (ref 70–99)
Glucose-Capillary: 197 mg/dL — ABNORMAL HIGH (ref 70–99)
Glucose-Capillary: 262 mg/dL — ABNORMAL HIGH (ref 70–99)
Glucose-Capillary: 210 mg/dL — ABNORMAL HIGH (ref 70–99)

## 2014-04-14 LAB — PRO B NATRIURETIC PEPTIDE: PRO B NATRI PEPTIDE: 8743 pg/mL — AB (ref 0–125)

## 2014-04-14 LAB — HEPARIN LEVEL (UNFRACTIONATED): Heparin Unfractionated: 0.41 IU/mL (ref 0.30–0.70)

## 2014-04-14 MED ORDER — SODIUM CHLORIDE 0.9 % IV SOLN: INTRAVENOUS | Status: DC

## 2014-04-14 MED ORDER — TICAGRELOR 90 MG PO TABS
90.0000 mg | ORAL_TABLET | Freq: Two times a day (BID) | ORAL | Status: DC
  Administered 2014-04-14 – 2014-04-21 (×14): 90 mg via ORAL
  Filled 2014-04-14 (×17): qty 1

## 2014-04-14 MED ORDER — IPRATROPIUM-ALBUTEROL 0.5-2.5 (3) MG/3ML IN SOLN
3.0000 mL | Freq: Two times a day (BID) | RESPIRATORY_TRACT | Status: DC
  Administered 2014-04-14 – 2014-04-21 (×15): 3 mL via RESPIRATORY_TRACT
  Filled 2014-04-14 (×14): qty 3

## 2014-04-14 MED ORDER — ALUM & MAG HYDROXIDE-SIMETH 200-200-20 MG/5ML PO SUSP
30.0000 mL | Freq: Four times a day (QID) | ORAL | Status: DC | PRN
Start: 2014-04-14 — End: 2014-04-21
  Administered 2014-04-17 – 2014-04-20 (×5): 30 mL via ORAL
  Filled 2014-04-14 (×4): qty 30

## 2014-04-14 NOTE — Progress Notes (Signed)
Subjective:  No CP/SOB  Objective:  Temp:  [98 F (36.7 C)-98.7 F (37.1 C)] 98 F (36.7 C) (05/18 0448) Pulse Rate:  [56-68] 68 (05/18 0740) Resp:  [18-21] 21 (05/18 0448) BP: (108-131)/(49-64) 125/63 mmHg (05/18 0740) SpO2:  [92 %-98 %] 92 % (05/18 0858) Weight:  [170 lb 6.7 oz (77.3 kg)] 170 lb 6.7 oz (77.3 kg) (05/18 0527) Weight change: 7.1 oz (0.2 kg)  Intake/Output from previous day: 05/17 0701 - 05/18 0700 In: 732 [P.O.:720; I.V.:12] Out: 1250 [Urine:1250]  Intake/Output from this shift:    Physical Exam: General appearance: alert and no distress Neck: no adenopathy, no carotid bruit, no JVD, supple, symmetrical, trachea midline and thyroid not enlarged, symmetric, no tenderness/mass/nodules Lungs: clear to auscultation bilaterally Heart: regular rate and rhythm, S1, S2 normal, no murmur, click, rub or gallop Extremities: extremities normal, atraumatic, no cyanosis or edema  Lab Results: Results for orders placed during the hospital encounter of 04/05/14 (from the past 48 hour(s))  GLUCOSE, CAPILLARY     Status: Abnormal   Collection Time    04/12/14 11:29 AM      Result Value Ref Range   Glucose-Capillary 258 (*) 70 - 99 mg/dL  GLUCOSE, CAPILLARY     Status: Abnormal   Collection Time    04/12/14  4:38 PM      Result Value Ref Range   Glucose-Capillary 304 (*) 70 - 99 mg/dL   Comment 1 Notify RN    GLUCOSE, CAPILLARY     Status: Abnormal   Collection Time    04/12/14  9:34 PM      Result Value Ref Range   Glucose-Capillary 187 (*) 70 - 99 mg/dL  GLUCOSE, CAPILLARY     Status: Abnormal   Collection Time    04/13/14  6:27 AM      Result Value Ref Range   Glucose-Capillary 118 (*) 70 - 99 mg/dL   Comment 1 Documented in Chart     Comment 2 Notify RN    HEPARIN LEVEL (UNFRACTIONATED)     Status: None   Collection Time    04/13/14  7:58 AM      Result Value Ref Range   Heparin Unfractionated 0.62  0.30 - 0.70 IU/mL   Comment:            IF  HEPARIN RESULTS ARE BELOW     EXPECTED VALUES, AND PATIENT     DOSAGE HAS BEEN CONFIRMED,     SUGGEST FOLLOW UP TESTING     OF ANTITHROMBIN III LEVELS.  CBC     Status: Abnormal   Collection Time    04/13/14  7:58 AM      Result Value Ref Range   WBC 11.1 (*) 4.0 - 10.5 K/uL   RBC 4.10  3.87 - 5.11 MIL/uL   Hemoglobin 9.5 (*) 12.0 - 15.0 g/dL   HCT 32.6 (*) 36.0 - 46.0 %   MCV 79.5  78.0 - 100.0 fL   MCH 23.2 (*) 26.0 - 34.0 pg   MCHC 29.1 (*) 30.0 - 36.0 g/dL   RDW 27.4 (*) 11.5 - 15.5 %   Platelets 195  150 - 400 K/uL  BASIC METABOLIC PANEL     Status: Abnormal   Collection Time    04/13/14  7:58 AM      Result Value Ref Range   Sodium 140  137 - 147 mEq/L   Potassium 3.8  3.7 - 5.3 mEq/L   Chloride  98  96 - 112 mEq/L   CO2 30  19 - 32 mEq/L   Glucose, Bld 107 (*) 70 - 99 mg/dL   BUN 27 (*) 6 - 23 mg/dL   Creatinine, Ser 0.98  0.50 - 1.10 mg/dL   Calcium 9.3  8.4 - 10.5 mg/dL   GFR calc non Af Amer 55 (*) >90 mL/min   GFR calc Af Amer 64 (*) >90 mL/min   Comment: (NOTE)     The eGFR has been calculated using the CKD EPI equation.     This calculation has not been validated in all clinical situations.     eGFR's persistently <90 mL/min signify possible Chronic Kidney     Disease.  GLUCOSE, CAPILLARY     Status: Abnormal   Collection Time    04/13/14 11:10 AM      Result Value Ref Range   Glucose-Capillary 213 (*) 70 - 99 mg/dL  GLUCOSE, CAPILLARY     Status: Abnormal   Collection Time    04/13/14  4:05 PM      Result Value Ref Range   Glucose-Capillary 289 (*) 70 - 99 mg/dL  GLUCOSE, CAPILLARY     Status: Abnormal   Collection Time    04/13/14  8:53 PM      Result Value Ref Range   Glucose-Capillary 229 (*) 70 - 99 mg/dL   Comment 1 Documented in Chart     Comment 2 Notify RN    HEPARIN LEVEL (UNFRACTIONATED)     Status: None   Collection Time    04/14/14  4:25 AM      Result Value Ref Range   Heparin Unfractionated 0.41  0.30 - 0.70 IU/mL   Comment:             IF HEPARIN RESULTS ARE BELOW     EXPECTED VALUES, AND PATIENT     DOSAGE HAS BEEN CONFIRMED,     SUGGEST FOLLOW UP TESTING     OF ANTITHROMBIN III LEVELS.  CBC     Status: Abnormal   Collection Time    04/14/14  4:25 AM      Result Value Ref Range   WBC 11.2 (*) 4.0 - 10.5 K/uL   RBC 3.80 (*) 3.87 - 5.11 MIL/uL   Hemoglobin 8.8 (*) 12.0 - 15.0 g/dL   HCT 29.9 (*) 36.0 - 46.0 %   MCV 78.7  78.0 - 100.0 fL   MCH 23.2 (*) 26.0 - 34.0 pg   MCHC 29.4 (*) 30.0 - 36.0 g/dL   RDW 27.4 (*) 11.5 - 15.5 %   Platelets 197  150 - 400 K/uL  BASIC METABOLIC PANEL     Status: Abnormal   Collection Time    04/14/14  4:25 AM      Result Value Ref Range   Sodium 139  137 - 147 mEq/L   Potassium 3.7  3.7 - 5.3 mEq/L   Chloride 98  96 - 112 mEq/L   CO2 32  19 - 32 mEq/L   Glucose, Bld 123 (*) 70 - 99 mg/dL   BUN 25 (*) 6 - 23 mg/dL   Creatinine, Ser 1.07  0.50 - 1.10 mg/dL   Calcium 9.1  8.4 - 10.5 mg/dL   GFR calc non Af Amer 50 (*) >90 mL/min   GFR calc Af Amer 58 (*) >90 mL/min   Comment: (NOTE)     The eGFR has been calculated using the CKD EPI equation.  This calculation has not been validated in all clinical situations.     eGFR's persistently <90 mL/min signify possible Chronic Kidney     Disease.  GLUCOSE, CAPILLARY     Status: Abnormal   Collection Time    04/14/14  6:14 AM      Result Value Ref Range   Glucose-Capillary 139 (*) 70 - 99 mg/dL   Comment 1 Documented in Chart     Comment 2 Notify RN      Imaging: Imaging results have been reviewed  Assessment/Plan:   1. Active Problems: 2.   DIABETES-TYPE 2 3.   ANEMIA, IRON DEFICIENCY 4.   HYPERTENSION 5.   Psoriatic arthritis 6.   Acute exacerbation of chronic obstructive pulmonary disease (COPD) 7.   GI bleeding 8.   Chronic combined systolic and diastolic CHF (congestive heart failure) 9.   Acute respiratory failure with hypoxia 10.   STEMI (ST elevation myocardial infarction) 11.   Constipation 12.    Pulmonary hypertension 13.   Time Spent Directly with Patient:  30 minutes  Length of Stay:  LOS: 9 days   Pt admitted for VDRF prob secondary to ischemically mediated pulm edema from LM/3VD and moderately severe LV dysfunction. Turned down by TCTS (PVT) secondary to risk and comorbidities. Her anatomy is best treated by HSRA/Stent LM/LAD. She has a long H/O anemia without etiology. Would require DAPT. She has an occluded REIA by PV angio I performed 12/06/12. Pt realizes that her options are limited and that if she goes home w/o an intervention that she will likely return with recurrent pulm edema / MI which she may not survive. Have discussed with the IC team. Pt is my clinic pt. She is willing to procede with high risk percutaneous intervention. Options for percutaneous LV hemodynamic support limited by PVD.  She has diuresed and her BS are clear.On lasix.   Lorretta Harp 04/14/2014, 10:01 AM

## 2014-04-14 NOTE — Progress Notes (Signed)
ANTICOAGULATION CONSULT NOTE - Follow Up Consult  Pharmacy Consult for Heparin Indication: chest pain/ACS  Allergies  Allergen Reactions  . Amlodipine Besy-Benazepril Hcl Other (See Comments)    Nervous/shakiness  . Statins Other (See Comments)    "can't take any of them; cramp me up" (12/06/2012)  . Tradjenta [Linagliptin] Other (See Comments)    Extreme muscle pains, chest and back pains  . Plavix [Clopidogrel] Rash    Pt states that the doctor told her that her rash was psoriasis and not related to plavix but she states the rash came from the plavix  . Raptiva [Efalizumab] Rash    Patient Measurements: Height: 5\' 4"  (162.6 cm) Weight: 170 lb 6.7 oz (77.3 kg) IBW/kg (Calculated) : 54.7 Heparin Dosing Weight:    Vital Signs: Temp: 98 F (36.7 C) (05/18 0448) Temp src: Oral (05/18 0448) BP: 125/63 mmHg (05/18 0740) Pulse Rate: 68 (05/18 0740)  Labs:  Recent Labs  04/12/14 0328 04/13/14 0758 04/14/14 0425  HGB 9.1* 9.5* 8.8*  HCT 31.4* 32.6* 29.9*  PLT 215 195 197  HEPARINUNFRC 0.67 0.62 0.41  CREATININE 1.16* 0.98 1.07    Estimated Creatinine Clearance: 46.4 ml/min (by C-G formula based on Cr of 1.07).   Assessment: 73 yo F presented to [facility] ED in respiratory distress and was intubated, had elevated trops, EKG abnormal but no changes from prior. Cath 5/09 revealed complicated anatomy, surgery recommends PCI or medical therapy, not a good CABG candidate  PMH: COPD, HTN, Asthma, GERD, Cirrhosis, DM, PAD   AC: ACS, hematuria overnight 5/10- now resolved. Too high risk for CABG. PCI recommended - tentative for this week. Heparin level 0.41 in goal.  ID: afeb, WBC 11, no abx (s/p levaquin course) 5/9 Bld>> NG 5/9 TA>> NG final  Zosyn 5/9>>5/11 Vancomycin 5/9>> 5/10 Levaquin 5/9>>5/14 (q48 dose >> actual therapy till ~5/16)  CV: bradycardic, HTN, CAD, NSTEMI s/p cath, EF 35-40%, diuresing more. Tentative high risk pci -see MD sticky, interventionalists to discuss  Monday to formulate plan, likely would need impella. Meds: on ASA325, lipitor (low dose d/t hx of intolerance), coreg, lasix, lisinopril  Endo: DM, BG 118-289, on lantus, SSI, steroids for psoriatic arthritis  GI: hx GI bleed 02/2014, AST/ALT slightly elevated, complaining of abd pain and with some distension, LBM 5/13, on PPI, miralax, simethicone, successful BM s/p mag citrate  Renal: scr stable 1.07  Neuro: A&O  Pulm: COPD, self-extubated, on Duonebs, Pulmicort  PTA Medication Issues: Spiriva  Best Practices: hep gtt, po PPI   Goal of Therapy:  Heparin level 0.3-0.7 units/ml Monitor platelets by anticoagulation protocol: Yes   Plan:  Heparin 1200 units/hr Daily HL and CBC   Randye Treichler S. Alford Highland, PharmD, [facility] Clinical Staff Pharmacist Pager [phone]  [facility] Alford Highland 04/14/2014,9:28 AM

## 2014-04-14 NOTE — Progress Notes (Signed)
Inpatient Diabetes Program Recommendations  AACE/ADA: New Consensus Statement on Inpatient Glycemic Control (2013)  Target Ranges:  Prepandial:   less than 140 mg/dL      Peak postprandial:   less than 180 mg/dL (1-2 hours)      Critically ill patients:  140 - 180 mg/dL  Results for BERIT, RACZKOWSKI (MRN [id number]) as of 04/14/2014 11:00  Ref. Range 04/13/2014 06:27 04/13/2014 11:10 04/13/2014 16:05 04/13/2014 20:53 04/14/2014 06:14  Glucose-Capillary Latest Range: 70-99 mg/dL 118 (H) 213 (H) 289 (H) 229 (H) 139 (H)   Inpatient Diabetes Program Recommendations Insulin - Meal Coverage: consider adding Novolog 3 units with breakfast and lunch (should not be given if patient eats <50% of meal) Elevated postprandial CBGs.   Thank you  Raoul Pitch BSN, RN,CDE Inpatient Diabetes Coordinator [phone] (team pager)

## 2014-04-14 NOTE — Progress Notes (Signed)
   INTERVENTIONAL CARDIOLOGY NOTE:  I was asked to review the cath films and evaluate [name] for high-risk PCI. I have discussed her case extensively with Dr. Gwenlyn Found. This is a complex situation in this 73 year old woman with multiple medical problems including severe COPD. She has had recurrent hospital admissions with congestive heart failure over the last several months. She's had non-ST elevation infarction and has been found to have severe proximal LAD stenosis, ulcerated stenosis of the left mainstem, and involvement of the proximal left circumflex. The patient was evaluated by cardiac surgery and she was felt to be a nonoperative candidate because of her comorbid medical conditions. Further complicating things is a history of chronic anemia. She has undergone upper endoscopy without obvious signs of bleeding. Colonoscopy has been planned but she has not been able to proceed with this because of her recent acute illnesses. Further complicating matters, the patient notes that she is unable to take Plavix because she had severe burning of her skin when she took Plavix for 30 days after peripheral artery stenting.  After discussion and review of options, which include only palliative medical therapy or high-risk PCI, we have elected to proceed with PCI using hemodynamic support. Will plan on rotational atherectomy of the left main and proximal LAD using Impella support. She may also require bifurcational stenting depending on the involvement of the left circumflex at the time of PCI. She understands that bailout CABG will absolutely not be an option if there is catastrophic complication. I have estimated her risk of major complication such as large MI or death at approximately 10%. She understands and agrees to proceed. Will plan on radial access for PCI and left femoral access for Impella.   As she could not tolerate plavix, will treat her with brilinta WITHOUT aspirin since she has chronic anemia and  GI blood loss. She understands there is no way around antiplatelet Rx if we treat her with PCI. Plan on proceeding with PCI Wednesday.  [name] 04/14/2014 7:36 PM

## 2014-04-14 NOTE — Progress Notes (Signed)
[facility] PROGRESS NOTE  [name] QIO:[id number] DOB: Dec 07, 1940 DOA: 04/05/2014 PCP: Leola Brazil, MD  Assessment/Plan: 73 y.o. female with PMH of HTN, CHF, COPD, DM, PAD, pulmonary HTN, h/o GI bleed, esophageal candidiasis and rheumatoid arthritis on chronic steroids was admitted to the critical care service with acute respiratory failure, found MI  1. STEMI; per cardiology  -LHC with ? Intervention on Wednesday; cont current regimen   2. Acute on chronic combined CHF with ICM, pulmonary HTN PASP 16mm hg:  -LVEF 35-40% with moderately reduced systolic function and grade 1 DD with moderate hypokinesis of mid-distal inferior myocardium. -improving on diuresis; cont current regimen   3. Acute respiratory failure thought ?PNA and/or pulm edema: Initially intubated for three days.  -resolved, extubated; blood cultures NGTD;  inhalers, oxygen, cont monitor; repeat CXR   4. Acute on chronic anemia (iron deficiency) with hx of3 GIB in past.  - S/p 1 unit PRBC transfusion on 5/11; TF prn; no s/s of acute bleeding; needs OP colonoscopy  5. Acute on chronic renal failure, CKD 2--Cr trending down 1.5>>1.33. Baseline <1  -continue to trend; improved with diuresis  6. Hypertension: Initially hypotensive, now improved.  - cont current regimen  7. DM2--Last A1C 7.2 in Feb 2014. D/c metformin with CKD -SSI moderate and Lantus 10 qhs    Code Status: full Family Communication:  D/w patient,her sister  (indicate person spoken with, relationship, and if by phone, the number) Disposition Plan: pend clinical improvement    Consultants:  Pulmonology  Cardiology  LINES / TUBES:  OETT 5/9 >>>04/06/14, 04/07/14 >  OGT 5/9 >>>  Foley 5/9 >>>  CULTURES:  5/9 Blood >>>  5/9 MRSA >>> neg  ANTIBIOTICS:  Zosyn 5/9 >>>  Vancomycin 5/9 >>> 5/10  Levaquin 5/9 >>>  HPI/Subjective: alert  Objective: Filed Vitals:   04/14/14 1534  BP: 126/63  Pulse: 64  Temp: 98.8 F (37.1  C)  Resp: 20    Intake/Output Summary (Last 24 hours) at 04/14/14 1602 Last data filed at 04/14/14 1215  Gross per 24 hour  Intake    480 ml  Output   1950 ml  Net  -1470 ml   Filed Weights   04/12/14 0430 04/13/14 0507 04/14/14 0527  Weight: 76.476 kg (168 lb 9.6 oz) 77.1 kg (169 lb 15.6 oz) 77.3 kg (170 lb 6.7 oz)    Exam:   General:  alert  Cardiovascular: s1,s2 rrr  Respiratory: decreased AE in R lung   Abdomen: soft, nt, nd   Musculoskeletal: no LE edema   Data Reviewed: Basic Metabolic Panel:  Recent Labs Lab 04/07/14 2248  04/08/14 0423 04/09/14 0240 04/10/14 0138 04/11/14 0500 04/12/14 0328 04/13/14 0758 04/14/14 0425  NA  --   < > 140 145 143 139 139 140 139  K  --   < > 4.7 4.1 3.9 3.7 3.7 3.8 3.7  CL  --   < > 104 102 98 98 96 98 98  CO2  --   < > 24 29 34* 30 31 30  32  GLUCOSE  --   < > 179* 105* 119* 137* 158* 107* 123*  BUN  --   < > 30* 29* 29* 31* 33* 27* 25*  CREATININE  --   < > 1.38* 1.33* 1.24* 1.17* 1.16* 0.98 1.07  CALCIUM  --   < > 9.2 9.8 9.9 9.6 9.5 9.3 9.1  MG  --   --  1.9 1.7 2.0 2.3  --   --   --  PHOS 3.9  --  4.7* 3.8 3.4 3.5  --   --   --   < > = values in this interval not displayed. Liver Function Tests: No results found for this basename: AST, ALT, ALKPHOS, BILITOT, PROT, ALBUMIN,  in the last 168 hours  Recent Labs Lab 04/09/14 0240  LIPASE 28  AMYLASE 79   No results found for this basename: AMMONIA,  in the last 168 hours CBC:  Recent Labs Lab 04/08/14 0423 04/09/14 0240 04/10/14 0138 04/11/14 0500 04/12/14 0328 04/13/14 0758 04/14/14 0425  WBC 13.4* 11.8* 12.5* 9.9 10.6* 11.1* 11.2*  NEUTROABS 11.8* 8.7* 9.7* 7.0  --   --   --   HGB 9.1* 9.4* 9.3* 9.2* 9.1* 9.5* 8.8*  HCT 30.9* 32.3* 31.5* 31.3* 31.4* 32.6* 29.9*  MCV 79.4 78.4 77.4* 79.6 78.9 79.5 78.7  PLT 236 253 217 215 215 195 197   Cardiac Enzymes: No results found for this basename: CKTOTAL, CKMB, CKMBINDEX, TROPONINI,  in the last 168  hours BNP (last 3 results)  Recent Labs  04/08/14 0423 04/08/14 1140 04/14/14 0425  PROBNP 13832.0* 21317.0* 8743.0*   CBG:  Recent Labs Lab 04/13/14 1110 04/13/14 1605 04/13/14 2053 04/14/14 0614 04/14/14 1111  GLUCAP 213* 289* 229* 139* 197*    Recent Results (from the past 240 hour(s))  CULTURE, BLOOD (ROUTINE X 2)     Status: None   Collection Time    04/05/14 10:50 AM      Result Value Ref Range Status   Specimen Description BLOOD RIGHT HAND   Final   Special Requests BOTTLES DRAWN AEROBIC AND ANAEROBIC 5CC   Final   Culture  Setup Time     Final   Value: 04/05/2014 17:38     Performed at [facility]   Culture     Final   Value: NO GROWTH 5 DAYS     Performed at [facility]   Report Status 04/11/2014 FINAL   Final  CULTURE, BLOOD (ROUTINE X 2)     Status: None   Collection Time    04/05/14 11:35 AM      Result Value Ref Range Status   Specimen Description BLOOD LEFT HAND   Final   Special Requests BOTTLES DRAWN AEROBIC AND ANAEROBIC 10CC   Final   Culture  Setup Time     Final   Value: 04/05/2014 17:38     Performed at [facility]   Culture     Final   Value: NO GROWTH 5 DAYS     Performed at [facility]   Report Status 04/11/2014 FINAL   Final  MRSA PCR SCREENING     Status: None   Collection Time    04/05/14  2:44 PM      Result Value Ref Range Status   MRSA by PCR NEGATIVE  NEGATIVE Final   Comment:            The GeneXpert MRSA Assay (FDA     approved for NASAL specimens     only), is one component of a     comprehensive MRSA colonization     surveillance program. It is not     intended to diagnose MRSA     infection nor to guide or     monitor treatment for     MRSA infections.  CULTURE, RESPIRATORY (NON-EXPECTORATED)     Status: None   Collection Time    04/07/14 12:16 PM  Result Value Ref Range Status   Specimen Description TRACHEAL ASPIRATE   Final   Special Requests NONE   Final   Gram Stain      Final   Value: FEW WBC PRESENT,BOTH PMN AND MONONUCLEAR     NO SQUAMOUS EPITHELIAL CELLS SEEN     NO ORGANISMS SEEN     Performed at [facility]   Culture     Final   Value: NO GROWTH     Performed at [facility]   Report Status 04/09/2014 FINAL   Final     Studies: No results found.  Scheduled Meds: . alum & mag hydroxide-simeth  15 mL Oral TID  . antiseptic oral rinse  15 mL Mouth Rinse BID  . aspirin  325 mg Oral Daily  . atorvastatin  20 mg Oral q1800  . brimonidine  1 drop Right Eye TID   And  . brinzolamide  1 drop Right Eye TID  . budesonide  0.25 mg Nebulization BID  . carvedilol  3.125 mg Oral BID WC  . cycloSPORINE  1 drop Both Eyes BID  . furosemide  40 mg Oral Daily  . insulin aspart  0-15 Units Subcutaneous TID AC & HS  . insulin glargine  10 Units Subcutaneous QHS  . ipratropium-albuterol  3 mL Nebulization BID  . latanoprost  1 drop Both Eyes QHS  . lisinopril  2.5 mg Oral Daily  . pantoprazole  40 mg Oral Q1200  . polyethylene glycol  17 g Oral BID  . predniSONE  20 mg Oral Q breakfast  . senna-docusate  2 tablet Oral BID  . sodium chloride  3 mL Intravenous Q12H   Continuous Infusions: . sodium chloride 10 mL/hr at 04/14/14 0601  . sodium chloride 10 mL/hr (04/14/14 0602)  . heparin 1,200 Units/hr (04/13/14 2124)  . nitroGLYCERIN Stopped (04/09/14 1816)    Active Problems:   DIABETES-TYPE 2   ANEMIA, IRON DEFICIENCY   HYPERTENSION   Psoriatic arthritis   Acute exacerbation of chronic obstructive pulmonary disease (COPD)   GI bleeding   Chronic combined systolic and diastolic CHF (congestive heart failure)   Acute respiratory failure with hypoxia   STEMI (ST elevation myocardial infarction)   Constipation   Pulmonary hypertension    Time spent: >35 minutes     Kinnie Feil  [facility] Pager [phone]. If 7PM-7AM, please contact night-coverage at [email or web address], password [facility] 04/14/2014, 4:02 PM  LOS: 9 days

## 2014-04-14 NOTE — Progress Notes (Signed)
04/14/14 0200  Output (mL)  Urine 450 mL  Urine Characteristics  Urinary Incontinence No  Urine Color Yellow/straw  Urine Appearance Cloudy  Urine Odor Malodorous

## 2014-04-14 NOTE — Progress Notes (Signed)
CARDIAC REHAB PHASE I   PRE:  Rate/Rhythm: 60 SR    BP: sitting 120/56    SaO2: 94 RA  MODE:  Ambulation: 250 ft   POST:  Rate/Rhythm: 83 SR    BP: sitting 120/60     SaO2: 91-92 RA  Tolerated fairly well. SOB after walk. Sts she had some chest tightness into neck over weekend. Will continue to follow. [place], ACSM 04/14/2014 9:32 AM

## 2014-04-15 LAB — GLUCOSE, CAPILLARY
GLUCOSE-CAPILLARY: 123 mg/dL — AB (ref 70–99)
GLUCOSE-CAPILLARY: 199 mg/dL — AB (ref 70–99)
Glucose-Capillary: 245 mg/dL — ABNORMAL HIGH (ref 70–99)
Glucose-Capillary: 254 mg/dL — ABNORMAL HIGH (ref 70–99)

## 2014-04-15 LAB — BASIC METABOLIC PANEL
BUN: 24 mg/dL — ABNORMAL HIGH (ref 6–23)
CALCIUM: 9.8 mg/dL (ref 8.4–10.5)
CO2: 31 mEq/L (ref 19–32)
CREATININE: 1.04 mg/dL (ref 0.50–1.10)
Chloride: 100 mEq/L (ref 96–112)
GFR calc Af Amer: 60 mL/min — ABNORMAL LOW (ref >90)
GFR calc non Af Amer: 52 mL/min — ABNORMAL LOW (ref >90)
GLUCOSE: 109 mg/dL — AB (ref 70–99)
Potassium: 3.4 mEq/L — ABNORMAL LOW (ref 3.7–5.3)
SODIUM: 142 meq/L (ref 137–147)

## 2014-04-15 LAB — CBC
HCT: 35.8 % — ABNORMAL LOW (ref 36.0–46.0)
HEMOGLOBIN: 10.6 g/dL — AB (ref 12.0–15.0)
MCH: 23.3 pg — AB (ref 26.0–34.0)
MCHC: 29.6 g/dL — ABNORMAL LOW (ref 30.0–36.0)
MCV: 78.9 fL (ref 78.0–100.0)
Platelets: 214 10*3/uL (ref 150–400)
RBC: 4.54 MIL/uL (ref 3.87–5.11)
RDW: 27.3 % — ABNORMAL HIGH (ref 11.5–15.5)
WBC: 12.2 10*3/uL — AB (ref 4.0–10.5)

## 2014-04-15 LAB — URINALYSIS, ROUTINE W REFLEX MICROSCOPIC
Bilirubin Urine: NEGATIVE
GLUCOSE, UA: NEGATIVE mg/dL
Hgb urine dipstick: NEGATIVE
Ketones, ur: NEGATIVE mg/dL
Nitrite: POSITIVE — AB
PH: 8 (ref 5.0–8.0)
Protein, ur: NEGATIVE mg/dL
Specific Gravity, Urine: 1.011 (ref 1.005–1.030)
Urobilinogen, UA: 0.2 mg/dL (ref 0.0–1.0)

## 2014-04-15 LAB — HEPARIN LEVEL (UNFRACTIONATED): Heparin Unfractionated: 0.53 IU/mL (ref 0.30–0.70)

## 2014-04-15 LAB — URINE MICROSCOPIC-ADD ON

## 2014-04-15 MED ORDER — ASPIRIN 81 MG PO CHEW
324.0000 mg | CHEWABLE_TABLET | ORAL | Status: AC
  Administered 2014-04-16: 324 mg via ORAL
  Filled 2014-04-15: qty 4

## 2014-04-15 MED ORDER — SODIUM CHLORIDE 0.9 % IJ SOLN
3.0000 mL | INTRAMUSCULAR | Status: DC | PRN
Start: 2014-04-15 — End: 2014-04-16

## 2014-04-15 MED ORDER — FUROSEMIDE 40 MG PO TABS
40.0000 mg | ORAL_TABLET | Freq: Two times a day (BID) | ORAL | Status: DC
  Administered 2014-04-15 – 2014-04-16 (×3): 40 mg via ORAL
  Filled 2014-04-15 (×6): qty 1

## 2014-04-15 MED ORDER — SODIUM CHLORIDE 0.9 % IV SOLN: INTRAVENOUS | Status: DC

## 2014-04-15 MED ORDER — SODIUM CHLORIDE 0.9 % IV SOLN: 250.0000 mL | INTRAVENOUS | Status: DC | PRN

## 2014-04-15 MED ORDER — SODIUM CHLORIDE 0.9 % IJ SOLN
3.0000 mL | Freq: Two times a day (BID) | INTRAMUSCULAR | Status: DC
  Administered 2014-04-15: 3 mL via INTRAVENOUS

## 2014-04-15 MED ORDER — DIAZEPAM 5 MG PO TABS
5.0000 mg | ORAL_TABLET | ORAL | Status: AC
  Administered 2014-04-16: 5 mg via ORAL
  Filled 2014-04-15: qty 1

## 2014-04-15 MED ORDER — SODIUM CHLORIDE 0.9 % IV SOLN
INTRAVENOUS | Status: DC
  Administered 2014-04-16: 07:00:00 via INTRAVENOUS

## 2014-04-15 MED ORDER — LEVOFLOXACIN 500 MG PO TABS
500.0000 mg | ORAL_TABLET | ORAL | Status: DC
  Administered 2014-04-15 – 2014-04-16 (×2): 500 mg via ORAL
  Filled 2014-04-15 (×4): qty 1

## 2014-04-15 MED ORDER — FUROSEMIDE 10 MG/ML IJ SOLN
40.0000 mg | Freq: Once | INTRAMUSCULAR | Status: AC
  Administered 2014-04-15: 40 mg via INTRAVENOUS
  Filled 2014-04-15: qty 4

## 2014-04-15 MED ORDER — POTASSIUM CHLORIDE CRYS ER 20 MEQ PO TBCR
30.0000 meq | EXTENDED_RELEASE_TABLET | Freq: Once | ORAL | Status: AC
  Administered 2014-04-15: 30 meq via ORAL
  Filled 2014-04-15: qty 1

## 2014-04-15 MED ORDER — ACETAMINOPHEN 325 MG PO TABS
650.0000 mg | ORAL_TABLET | ORAL | Status: DC | PRN
  Administered 2014-04-15: 650 mg via ORAL
  Filled 2014-04-15: qty 2

## 2014-04-15 MED ORDER — SODIUM CHLORIDE 0.9 % IJ SOLN: 3.0000 mL | INTRAMUSCULAR | Status: DC | PRN

## 2014-04-15 MED ORDER — SODIUM CHLORIDE 0.9 % IJ SOLN
3.0000 mL | Freq: Two times a day (BID) | INTRAMUSCULAR | Status: DC
  Administered 2014-04-15 – 2014-04-16 (×2): 3 mL via INTRAVENOUS

## 2014-04-15 NOTE — Progress Notes (Addendum)
[facility] PROGRESS NOTE  [name] ELF:[id number] DOB: 1941-07-12 DOA: 04/05/2014 PCP: Leola Brazil, MD  Assessment/Plan: 73 y.o. female with PMH of HTN, CHF, COPD, DM, PAD, pulmonary HTN, h/o GI bleed, esophageal candidiasis and rheumatoid arthritis on chronic steroids was admitted to the critical care service with acute respiratory failure, found MI  1. STEMI; per cardiology  -LHC with ? Intervention on Wednesday; cont current regimen   2. Acute on chronic combined CHF with ICM, pulmonary HTN PASP 63mm hg:  -LVEF 35-40% with moderately reduced systolic function and grade 1 DD with moderate hypokinesis of mid-distal inferior myocardium. Pericardial effusion, defer management to cardiology  -improving on diuresis; cont diuresis    3. Acute respiratory failure thought ?PNA and/or pulm edema: Initially intubated for three days.  -resolved, extubated; blood cultures NGTD;  inhalers, oxygen, cont monitor;  4. Acute on chronic anemia (iron deficiency) with hx of 3 GIB in past.  - S/p 1 unit PRBC transfusion on 5/11; TF prn; no s/s of acute bleeding; needs OP colonoscopy  5. Acute on chronic renal failure, CKD 2--Cr trending down 1.5>>1.33. Baseline <1  -continue to trend; improved with diuresis  6. Hypertension: Initially hypotensive, now improved.  - cont current regimen  7. DM2--Last A1C 7.2 in Feb 2014. D/c metformin with CKD -SSI moderate and Lantus 10 qhs  8. Dysuria, urinary retention; ? UTI; UA was abnormal on admission -repeat UA; if +start IV atx;    Code Status: full Family Communication:  D/w patient,her sister  (indicate person spoken with, relationship, and if by phone, the number) Disposition Plan: pend clinical improvement    Consultants:  Pulmonology  Cardiology  LINES / TUBES:  OETT 5/9 >>>04/06/14, 04/07/14 >  OGT 5/9 >>>  Foley 5/9 >>>  CULTURES:  5/9 Blood >>>  5/9 MRSA >>> neg  ANTIBIOTICS:  Zosyn 5/9 >>>  Vancomycin 5/9 >>> 5/10   Levaquin 5/9 >>>  HPI/Subjective: alert  Objective: Filed Vitals:   04/15/14 0438  BP: 115/45  Pulse: 60  Temp: 98.6 F (37 C)  Resp: 20    Intake/Output Summary (Last 24 hours) at 04/15/14 0924 Last data filed at 04/15/14 0441  Gross per 24 hour  Intake    480 ml  Output   1550 ml  Net  -1070 ml   Filed Weights   04/13/14 0507 04/14/14 0527 04/15/14 0438  Weight: 77.1 kg (169 lb 15.6 oz) 77.3 kg (170 lb 6.7 oz) 76.5 kg (168 lb 10.4 oz)    Exam:   General:  alert  Cardiovascular: s1,s2 rrr  Respiratory: decreased AE in R lung   Abdomen: soft, nt, nd   Musculoskeletal: no LE edema   Data Reviewed: Basic Metabolic Panel:  Recent Labs Lab 04/09/14 0240 04/10/14 0138 04/11/14 0500 04/12/14 0328 04/13/14 0758 04/14/14 0425 04/15/14 0525  NA 145 143 139 139 140 139 142  K 4.1 3.9 3.7 3.7 3.8 3.7 3.4*  CL 102 98 98 96 98 98 100  CO2 29 34* 30 31 30  32 31  GLUCOSE 105* 119* 137* 158* 107* 123* 109*  BUN 29* 29* 31* 33* 27* 25* 24*  CREATININE 1.33* 1.24* 1.17* 1.16* 0.98 1.07 1.04  CALCIUM 9.8 9.9 9.6 9.5 9.3 9.1 9.8  MG 1.7 2.0 2.3  --   --   --   --   PHOS 3.8 3.4 3.5  --   --   --   --    Liver Function Tests: No results found  for this basename: AST, ALT, ALKPHOS, BILITOT, PROT, ALBUMIN,  in the last 168 hours  Recent Labs Lab 04/09/14 0240  LIPASE 28  AMYLASE 79   No results found for this basename: AMMONIA,  in the last 168 hours CBC:  Recent Labs Lab 04/09/14 0240 04/10/14 0138 04/11/14 0500 04/12/14 0328 04/13/14 0758 04/14/14 0425 04/15/14 0525  WBC 11.8* 12.5* 9.9 10.6* 11.1* 11.2* 12.2*  NEUTROABS 8.7* 9.7* 7.0  --   --   --   --   HGB 9.4* 9.3* 9.2* 9.1* 9.5* 8.8* 10.6*  HCT 32.3* 31.5* 31.3* 31.4* 32.6* 29.9* 35.8*  MCV 78.4 77.4* 79.6 78.9 79.5 78.7 78.9  PLT 253 217 215 215 195 197 214   Cardiac Enzymes: No results found for this basename: CKTOTAL, CKMB, CKMBINDEX, TROPONINI,  in the last 168 hours BNP (last 3  results)  Recent Labs  04/08/14 0423 04/08/14 1140 04/14/14 0425  PROBNP 13832.0* 21317.0* 8743.0*   CBG:  Recent Labs Lab 04/14/14 0614 04/14/14 1111 04/14/14 1613 04/14/14 2230 04/15/14 0604  GLUCAP 139* 197* 262* 210* 123*    Recent Results (from the past 240 hour(s))  CULTURE, BLOOD (ROUTINE X 2)     Status: None   Collection Time    04/05/14 10:50 AM      Result Value Ref Range Status   Specimen Description BLOOD RIGHT HAND   Final   Special Requests BOTTLES DRAWN AEROBIC AND ANAEROBIC 5CC   Final   Culture  Setup Time     Final   Value: 04/05/2014 17:38     Performed at [facility]   Culture     Final   Value: NO GROWTH 5 DAYS     Performed at [facility]   Report Status 04/11/2014 FINAL   Final  CULTURE, BLOOD (ROUTINE X 2)     Status: None   Collection Time    04/05/14 11:35 AM      Result Value Ref Range Status   Specimen Description BLOOD LEFT HAND   Final   Special Requests BOTTLES DRAWN AEROBIC AND ANAEROBIC 10CC   Final   Culture  Setup Time     Final   Value: 04/05/2014 17:38     Performed at [facility]   Culture     Final   Value: NO GROWTH 5 DAYS     Performed at [facility]   Report Status 04/11/2014 FINAL   Final  MRSA PCR SCREENING     Status: None   Collection Time    04/05/14  2:44 PM      Result Value Ref Range Status   MRSA by PCR NEGATIVE  NEGATIVE Final   Comment:            The GeneXpert MRSA Assay (FDA     approved for NASAL specimens     only), is one component of a     comprehensive MRSA colonization     surveillance program. It is not     intended to diagnose MRSA     infection nor to guide or     monitor treatment for     MRSA infections.  CULTURE, RESPIRATORY (NON-EXPECTORATED)     Status: None   Collection Time    04/07/14 12:16 PM      Result Value Ref Range Status   Specimen Description TRACHEAL ASPIRATE   Final   Special Requests NONE   Final   Gram Stain  Final   Value:  FEW WBC PRESENT,BOTH PMN AND MONONUCLEAR     NO SQUAMOUS EPITHELIAL CELLS SEEN     NO ORGANISMS SEEN     Performed at [facility]   Culture     Final   Value: NO GROWTH     Performed at [facility]   Report Status 04/09/2014 FINAL   Final     Studies: Dg Chest 2 View  04/15/2014   CLINICAL DATA:  Cough.  EXAM: CHEST  2 VIEW  COMPARISON:  Chest x-ray 04/09/2014.  FINDINGS: Interval resolution of previously noted pleural effusions. Linear opacity at the right base is most compatible with an area of residual subsegmental atelectasis. No consolidative airspace disease. No evidence of pulmonary edema. Moderate enlargement of the cardiopericardial silhouette may reflect underlying cardiomegaly and/or the presence of a pericardial effusion. Upper mediastinal contours are within normal limits. Atherosclerosis in the thoracic aorta.  IMPRESSION: 1. Resolution of pleural effusions. 2. Enlargement of the cardiopericardial silhouette may reflect underlying cardiomegaly, however, the appearance of the cardiopericardial silhouette could suggest presence of a pericardial effusion. Echocardiographic correlation should be considered if clinically appropriate. 3. Atherosclerosis.   Electronically Signed   By: Vinnie Langton M.D.   On: 04/15/2014 04:06    Scheduled Meds: . alum & mag hydroxide-simeth  15 mL Oral TID  . antiseptic oral rinse  15 mL Mouth Rinse BID  . atorvastatin  20 mg Oral q1800  . brimonidine  1 drop Right Eye TID   And  . brinzolamide  1 drop Right Eye TID  . budesonide  0.25 mg Nebulization BID  . carvedilol  3.125 mg Oral BID WC  . cycloSPORINE  1 drop Both Eyes BID  . furosemide  40 mg Oral BID  . insulin aspart  0-15 Units Subcutaneous TID AC & HS  . insulin glargine  10 Units Subcutaneous QHS  . ipratropium-albuterol  3 mL Nebulization BID  . latanoprost  1 drop Both Eyes QHS  . lisinopril  2.5 mg Oral Daily  . pantoprazole  40 mg Oral Q1200  . polyethylene  glycol  17 g Oral BID  . predniSONE  20 mg Oral Q breakfast  . senna-docusate  2 tablet Oral BID  . sodium chloride  3 mL Intravenous Q12H  . sodium chloride  3 mL Intravenous Q12H  . ticagrelor  90 mg Oral BID   Continuous Infusions: . sodium chloride 10 mL/hr at 04/14/14 0601  . sodium chloride 10 mL/hr (04/14/14 0602)  . [START ON 04/16/2014] sodium chloride    . heparin 1,200 Units/hr (04/14/14 1938)    Active Problems:   DIABETES-TYPE 2   ANEMIA, IRON DEFICIENCY   HYPERTENSION   Psoriatic arthritis   Acute exacerbation of chronic obstructive pulmonary disease (COPD)   GI bleeding   Chronic combined systolic and diastolic CHF (congestive heart failure)   Acute respiratory failure with hypoxia   STEMI (ST elevation myocardial infarction)   Constipation   Pulmonary hypertension    Time spent: >35 minutes     Kinnie Feil  [facility] Pager [phone]. If 7PM-7AM, please contact night-coverage at [email or web address], password [facility] 04/15/2014, 9:24 AM  LOS: 10 days

## 2014-04-15 NOTE — Progress Notes (Signed)
Subjective:  C/O more SOB this AM requiring additional IV lasix  Objective:  Temp:  [98.6 F (37 C)-98.8 F (37.1 C)] 98.6 F (37 C) (05/19 0438) Pulse Rate:  [53-64] 60 (05/19 0438) Resp:  [18-20] 20 (05/19 0438) BP: (115-143)/(45-88) 115/45 mmHg (05/19 0438) SpO2:  [92 %-99 %] 97 % (05/19 0438) Weight:  [168 lb 10.4 oz (76.5 kg)] 168 lb 10.4 oz (76.5 kg) (05/19 0438) Weight change: -1 lb 12.2 oz (-0.8 kg)  Intake/Output from previous day: 05/18 0701 - 05/19 0700 In: 480 [P.O.:480] Out: 1800 [Urine:1800]  Intake/Output from this shift:    Physical Exam: General appearance: alert and mild distress Neck: no adenopathy, no carotid bruit, supple, symmetrical, trachea midline and thyroid not enlarged, symmetric, no tenderness/mass/nodules Lungs: clear to auscultation bilaterally Heart: regular rate and rhythm, S1, S2 normal, no murmur, click, rub or gallop Extremities: trace edema  Lab Results: Results for orders placed during the hospital encounter of 04/05/14 (from the past 48 hour(s))  GLUCOSE, CAPILLARY     Status: Abnormal   Collection Time    04/13/14 11:10 AM      Result Value Ref Range   Glucose-Capillary 213 (*) 70 - 99 mg/dL  GLUCOSE, CAPILLARY     Status: Abnormal   Collection Time    04/13/14  4:05 PM      Result Value Ref Range   Glucose-Capillary 289 (*) 70 - 99 mg/dL  GLUCOSE, CAPILLARY     Status: Abnormal   Collection Time    04/13/14  8:53 PM      Result Value Ref Range   Glucose-Capillary 229 (*) 70 - 99 mg/dL   Comment 1 Documented in Chart     Comment 2 Notify RN    HEPARIN LEVEL (UNFRACTIONATED)     Status: None   Collection Time    04/14/14  4:25 AM      Result Value Ref Range   Heparin Unfractionated 0.41  0.30 - 0.70 IU/mL   Comment:            IF HEPARIN RESULTS ARE BELOW     EXPECTED VALUES, AND PATIENT     DOSAGE HAS BEEN CONFIRMED,     SUGGEST FOLLOW UP TESTING     OF ANTITHROMBIN III LEVELS.  CBC     Status: Abnormal   Collection Time    04/14/14  4:25 AM      Result Value Ref Range   WBC 11.2 (*) 4.0 - 10.5 K/uL   RBC 3.80 (*) 3.87 - 5.11 MIL/uL   Hemoglobin 8.8 (*) 12.0 - 15.0 g/dL   HCT 29.9 (*) 36.0 - 46.0 %   MCV 78.7  78.0 - 100.0 fL   MCH 23.2 (*) 26.0 - 34.0 pg   MCHC 29.4 (*) 30.0 - 36.0 g/dL   RDW 27.4 (*) 11.5 - 15.5 %   Platelets 197  150 - 400 K/uL  BASIC METABOLIC PANEL     Status: Abnormal   Collection Time    04/14/14  4:25 AM      Result Value Ref Range   Sodium 139  137 - 147 mEq/L   Potassium 3.7  3.7 - 5.3 mEq/L   Chloride 98  96 - 112 mEq/L   CO2 32  19 - 32 mEq/L   Glucose, Bld 123 (*) 70 - 99 mg/dL   BUN 25 (*) 6 - 23 mg/dL   Creatinine, Ser 1.07  0.50 - 1.10 mg/dL  Calcium 9.1  8.4 - 10.5 mg/dL   GFR calc non Af Amer 50 (*) >90 mL/min   GFR calc Af Amer 58 (*) >90 mL/min   Comment: (NOTE)     The eGFR has been calculated using the CKD EPI equation.     This calculation has not been validated in all clinical situations.     eGFR's persistently <90 mL/min signify possible Chronic Kidney     Disease.  PRO B NATRIURETIC PEPTIDE     Status: Abnormal   Collection Time    04/14/14  4:25 AM      Result Value Ref Range   Pro B Natriuretic peptide (BNP) 8743.0 (*) 0 - 125 pg/mL  GLUCOSE, CAPILLARY     Status: Abnormal   Collection Time    04/14/14  6:14 AM      Result Value Ref Range   Glucose-Capillary 139 (*) 70 - 99 mg/dL   Comment 1 Documented in Chart     Comment 2 Notify RN    GLUCOSE, CAPILLARY     Status: Abnormal   Collection Time    04/14/14 11:11 AM      Result Value Ref Range   Glucose-Capillary 197 (*) 70 - 99 mg/dL  GLUCOSE, CAPILLARY     Status: Abnormal   Collection Time    04/14/14  4:13 PM      Result Value Ref Range   Glucose-Capillary 262 (*) 70 - 99 mg/dL   Comment 1 Notify RN     Comment 2 Documented in Chart    GLUCOSE, CAPILLARY     Status: Abnormal   Collection Time    04/14/14 10:30 PM      Result Value Ref Range   Glucose-Capillary  210 (*) 70 - 99 mg/dL   Comment 1 Documented in Chart     Comment 2 Notify RN    HEPARIN LEVEL (UNFRACTIONATED)     Status: None   Collection Time    04/15/14  5:25 AM      Result Value Ref Range   Heparin Unfractionated 0.53  0.30 - 0.70 IU/mL   Comment:            IF HEPARIN RESULTS ARE BELOW     EXPECTED VALUES, AND PATIENT     DOSAGE HAS BEEN CONFIRMED,     SUGGEST FOLLOW UP TESTING     OF ANTITHROMBIN III LEVELS.  CBC     Status: Abnormal   Collection Time    04/15/14  5:25 AM      Result Value Ref Range   WBC 12.2 (*) 4.0 - 10.5 K/uL   RBC 4.54  3.87 - 5.11 MIL/uL   Hemoglobin 10.6 (*) 12.0 - 15.0 g/dL   Comment: REPEATED TO VERIFY   HCT 35.8 (*) 36.0 - 46.0 %   MCV 78.9  78.0 - 100.0 fL   MCH 23.3 (*) 26.0 - 34.0 pg   MCHC 29.6 (*) 30.0 - 36.0 g/dL   RDW 27.3 (*) 11.5 - 15.5 %   Platelets 214  150 - 400 K/uL  BASIC METABOLIC PANEL     Status: Abnormal   Collection Time    04/15/14  5:25 AM      Result Value Ref Range   Sodium 142  137 - 147 mEq/L   Potassium 3.4 (*) 3.7 - 5.3 mEq/L   Chloride 100  96 - 112 mEq/L   CO2 31  19 - 32 mEq/L   Glucose, Bld 109 (*)  70 - 99 mg/dL   BUN 24 (*) 6 - 23 mg/dL   Creatinine, Ser 1.04  0.50 - 1.10 mg/dL   Calcium 9.8  8.4 - 10.5 mg/dL   GFR calc non Af Amer 52 (*) >90 mL/min   GFR calc Af Amer 60 (*) >90 mL/min   Comment: (NOTE)     The eGFR has been calculated using the CKD EPI equation.     This calculation has not been validated in all clinical situations.     eGFR's persistently <90 mL/min signify possible Chronic Kidney     Disease.  GLUCOSE, CAPILLARY     Status: Abnormal   Collection Time    04/15/14  6:04 AM      Result Value Ref Range   Glucose-Capillary 123 (*) 70 - 99 mg/dL   Comment 1 Documented in Chart     Comment 2 Notify RN      Imaging: Imaging results have been reviewed  Assessment/Plan:   1. Active Problems: 2.   DIABETES-TYPE 2 3.   ANEMIA, IRON DEFICIENCY 4.   HYPERTENSION 5.   Psoriatic  arthritis 6.   Acute exacerbation of chronic obstructive pulmonary disease (COPD) 7.   GI bleeding 8.   Chronic combined systolic and diastolic CHF (congestive heart failure) 9.   Acute respiratory failure with hypoxia 10.   STEMI (ST elevation myocardial infarction) 11.   Constipation 12.   Pulmonary hypertension 13.   Time Spent Directly with Patient:  30 minutes  Length of Stay:  LOS: 10 days   Pt's data base reviewed. I have personally spoken to Dr Prescott Gum who has turned pt down for high risk CABG . I have reviewed her prior PV angios that I did 1/14 and demonstrated occluded REIA limiting impella access to LCFA. The case has been reviewed with Dr. Burt Knack who has agreed to perform high risk LM/Prox LAD HSRA ( unprotected) with hemodynamic support. There are no surgical bailout options. Pt is aware of this and wishes to proceed. She appears a little volume overloaded and will increase lasix to BID. Brilenta started (apparently can't take plavix)  [name] 04/15/2014, 8:12 AM

## 2014-04-15 NOTE — Progress Notes (Signed)
Pt complained of increasing shortness of breath. O2 sat 100% on room air. Vital signs stable.  Pt took Xanax at 01:30 to help her rest and Tylenol for mild neck pain. Lungs have increased crackles since earlier shift assessment. Called MD and received orders for 40 mg IV lasix now.

## 2014-04-15 NOTE — Progress Notes (Signed)
1130 Pt more SOB today. Talked with pt's RN. Will hold walk and follow up after intervention. Graylon Good RN BSN 04/15/2014 11:30 AM

## 2014-04-15 NOTE — Progress Notes (Addendum)
ANTICOAGULATION CONSULT NOTE - Follow Up Consult  Pharmacy Consult for Heparin/Levaquin Indication: chest pain/ACS + UTI  Allergies  Allergen Reactions  . Amlodipine Besy-Benazepril Hcl Other (See Comments)    Nervous/shakiness  . Statins Other (See Comments)    "can't take any of them; cramp me up" (12/06/2012)  . Tradjenta [Linagliptin] Other (See Comments)    Extreme muscle pains, chest and back pains  . Plavix [Clopidogrel] Rash    Pt states that the doctor told her that her rash was psoriasis and not related to plavix but she states the rash came from the plavix  . Raptiva [Efalizumab] Rash    Patient Measurements: Height: 5\' 4"  (162.6 cm) Weight: 168 lb 10.4 oz (76.5 kg) IBW/kg (Calculated) : 54.7 Heparin Dosing Weight:  71 kg  Vital Signs: Temp: 98.6 F (37 C) (05/19 0438) Temp src: Oral (05/19 0438) BP: 115/45 mmHg (05/19 0438) Pulse Rate: 60 (05/19 0438)  Labs:  Recent Labs  04/13/14 0758 04/14/14 0425 04/15/14 0525  HGB 9.5* 8.8* 10.6*  HCT 32.6* 29.9* 35.8*  PLT 195 197 214  HEPARINUNFRC 0.62 0.41 0.53  CREATININE 0.98 1.07 1.04    Estimated Creatinine Clearance: 47.5 ml/min (by C-G formula based on Cr of 1.04).   Assessment: 73 yo F presented to [facility] ED in respiratory distress and was intubated, had elevated trops, EKG abnormal but no changes from prior. Cath 4/19 revealed complicated anatomy, surgery recommends PCI or medical therapy, not a good CABG candidate  PMH: COPD, HTN, Asthma, GERD, Cirrhosis, DM, PAD   AC: ACS, hematuria overnight 5/10- resolved. Too high risk for CABG. PCI recommended - tentative for this week. Heparin level 0.53 in goal. Hgb 10.6 improved. Plts stable.  ID: afeb, WBC 12.2 up, no abx (s/p levaquin course). Resume Levaquin for urine with sediment for probably UTI. No cultures yet. 5/9 Bld>> NG 5/9 TA>> NG final  Zosyn 5/9>>5/11 Vancomycin 5/9>> 5/10 Levaquin 5/9>>5/14 (q48 dose >> actual therapy till ~5/16) Levaquin 5/19  x 3d  CV: bradycardic, HTN, CAD, NSTEMI s/p cath, CHF with EF 35-40%, PAD, pulmHTN, Plan high risk cath (Wed?) Meds: lipitor (low dose d/t hx of intolerance), coreg, lasix, lisinopril, ASA changed to Brilinta  Endo: DM, BG 123-262, on lantus 10/hs, SSI, steroids for psoriatic arthritis. No A1C  GI: hx GI bleed (x3) 02/2014, AST/ALT slightly elevated, complaining of abd pain and with some distension, LBM 5/13, on PPI, miralax, simethicone, successful BM s/p mag citrate  Renal: scr stable 1.04. UOP 1. I/O -1320.  Neuro: A&O  Pulm: COPD, self-extubated, on Duonebs, Pulmicort  PTA Medication Issues: Spiriva  Best Practices: hep gtt, po PPI   Goal of Therapy:  Heparin level 0.3-0.7 units/ml Monitor platelets by anticoagulation protocol: Yes   Plan:  Heparin 1200 units/hr Daily HL and CBC Levaquin 500mg  po daily x 3d for UTI   Cailen Mihalik S. Alford Highland, PharmD, [facility] Clinical Staff Pharmacist Pager [phone]  Wayland Salinas 04/15/2014,1:17 PM

## 2014-04-15 NOTE — Progress Notes (Signed)
Foley placed per order/protocol with sterile procedure, yellow urine with sediment returned Alfonzo Feller

## 2014-04-15 NOTE — Progress Notes (Signed)
Pt complaint of feeling as if not emptying bladder,voided 400, pvr 386, MD made aware . Pt sob with activity, does not tolerate lying down in bed for short periods, room air sat 96% [name]

## 2014-04-15 NOTE — Care Management Note (Addendum)
    Page 1 of 2   04/18/2014     1:32:41 PM CARE MANAGEMENT NOTE 04/18/2014  Patient:  [name], [name]   Account Number:  [id number]  Date Initiated:  04/06/2014  Documentation initiated by:  [name]  Subjective/Objective Assessment:   Admitted with resp failure - intubated.     Action/Plan:   Anticipated DC Date:  04/19/2014   Anticipated DC Plan:  [name]  CM consult  Medication Assistance      [facility] Choice  Resumption Of Svcs/PTA Provider   Choice offered to / List presented to:          [name] arranged  [name]  [name].   Status of service:  Completed, signed off Medicare Important Message given?  YES (If response is "NO", the following Medicare IM given date fields will be blank) Date Medicare IM given:  04/09/2014 Date Additional Medicare IM given:  04/18/2014  Discharge Disposition:  [name]  Per UR Regulation:  Reviewed for med. necessity/level of care/duration of stay  If discussed at [name] of Stay Meetings, dates discussed:   04/10/2014  04/18/2014    Comments:  Contact:  [name],[name] Sister [phone]   [phone]                 [name],[name] Brother [phone]                 [name],[name] Brother [phone]  5/22 1035a debbie Yazlynn Birkeland rn,bsn pt for poss dc in am. act w [facility]. gave pt 30day free brilinta card. will alert [facility] of poss dc in am. pt has 45.00 per month copay w no prior auth req.  04/15/14 Ellan Lambert, RN, BSN [phone] PCI planned for tomorrow.  Pt with increased SOB today; would benefit from PT c/s to determine hoome needs.  MD, please order when appropriate/pt able to tolerate.

## 2014-04-16 ENCOUNTER — Encounter: Payer: Medicare Other | Admitting: Internal Medicine

## 2014-04-16 ENCOUNTER — Encounter (HOSPITAL_COMMUNITY)
Admission: EM | Disposition: A | Discharge status: Home Health | Payer: Medicare Other | Source: Home / Self Care | Attending: Cardiology

## 2014-04-16 ENCOUNTER — Ambulatory Visit: Payer: Medicare HMO | Admitting: Podiatry

## 2014-04-16 DIAGNOSIS — I5043 Acute on chronic combined systolic (congestive) and diastolic (congestive) heart failure: Secondary | ICD-10-CM

## 2014-04-16 DIAGNOSIS — N390 Urinary tract infection, site not specified: Secondary | ICD-10-CM | POA: Diagnosis present

## 2014-04-16 DIAGNOSIS — L4050 Arthropathic psoriasis, unspecified: Secondary | ICD-10-CM

## 2014-04-16 DIAGNOSIS — I2510 Atherosclerotic heart disease of native coronary artery without angina pectoris: Secondary | ICD-10-CM

## 2014-04-16 HISTORY — PX: PERCUTANEOUS CORONARY ROTOBLATOR INTERVENTION (PCI-R): SHX5484

## 2014-04-16 LAB — BASIC METABOLIC PANEL
BUN: 20 mg/dL (ref 6–23)
BUN: 21 mg/dL (ref 6–23)
CHLORIDE: 103 meq/L (ref 96–112)
CO2: 29 mEq/L (ref 19–32)
CO2: 29 meq/L (ref 19–32)
Calcium: 9.5 mg/dL (ref 8.4–10.5)
Calcium: 8.9 mg/dL (ref 8.4–10.5)
Chloride: 102 mEq/L (ref 96–112)
Creatinine, Ser: 1.06 mg/dL (ref 0.50–1.10)
Creatinine, Ser: 1.13 mg/dL — ABNORMAL HIGH (ref 0.50–1.10)
GFR calc Af Amer: 58 mL/min — ABNORMAL LOW (ref >90)
GFR calc Af Amer: 54 mL/min — ABNORMAL LOW (ref >90)
GFR calc non Af Amer: 47 mL/min — ABNORMAL LOW (ref >90)
GFR, EST NON AFRICAN AMERICAN: 50 mL/min — AB (ref >90)
GLUCOSE: 244 mg/dL — AB (ref 70–99)
Glucose, Bld: 115 mg/dL — ABNORMAL HIGH (ref 70–99)
Potassium: 4 mEq/L (ref 3.7–5.3)
Potassium: 5 mEq/L (ref 3.7–5.3)
SODIUM: 141 meq/L (ref 137–147)
Sodium: 141 mEq/L (ref 137–147)

## 2014-04-16 LAB — POCT I-STAT, CHEM 8
BUN: 19 mg/dL (ref 6–23)
CALCIUM ION: 1.24 mmol/L (ref 1.13–1.30)
Chloride: 99 mEq/L (ref 96–112)
Creatinine, Ser: 1.2 mg/dL — ABNORMAL HIGH (ref 0.50–1.10)
GLUCOSE: 229 mg/dL — AB (ref 70–99)
HCT: 27 % — ABNORMAL LOW (ref 36.0–46.0)
Hemoglobin: 9.2 g/dL — ABNORMAL LOW (ref 12.0–15.0)
Potassium: 4.5 mEq/L (ref 3.7–5.3)
Sodium: 136 mEq/L — ABNORMAL LOW (ref 137–147)
TCO2: 26 mmol/L (ref 0–100)

## 2014-04-16 LAB — POCT ACTIVATED CLOTTING TIME
ACTIVATED CLOTTING TIME: 287 s
ACTIVATED CLOTTING TIME: 182 s
Activated Clotting Time: 282 seconds

## 2014-04-16 LAB — CBC
HCT: 27.3 % — ABNORMAL LOW (ref 36.0–46.0)
HEMATOCRIT: 28.5 % — AB (ref 36.0–46.0)
Hemoglobin: 8.2 g/dL — ABNORMAL LOW (ref 12.0–15.0)
Hemoglobin: 8.5 g/dL — ABNORMAL LOW (ref 12.0–15.0)
MCH: 23.8 pg — ABNORMAL LOW (ref 26.0–34.0)
MCH: 23.7 pg — ABNORMAL LOW (ref 26.0–34.0)
MCHC: 30 g/dL (ref 30.0–36.0)
MCHC: 29.8 g/dL — AB (ref 30.0–36.0)
MCV: 79.1 fL (ref 78.0–100.0)
MCV: 79.4 fL (ref 78.0–100.0)
PLATELETS: 240 10*3/uL (ref 150–400)
Platelets: 250 10*3/uL (ref 150–400)
RBC: 3.45 MIL/uL — ABNORMAL LOW (ref 3.87–5.11)
RBC: 3.59 MIL/uL — ABNORMAL LOW (ref 3.87–5.11)
RDW: 27.6 % — AB (ref 11.5–15.5)
RDW: 27.5 % — ABNORMAL HIGH (ref 11.5–15.5)
WBC: 13.5 10*3/uL — ABNORMAL HIGH (ref 4.0–10.5)
WBC: 13.9 10*3/uL — AB (ref 4.0–10.5)

## 2014-04-16 LAB — GLUCOSE, CAPILLARY
GLUCOSE-CAPILLARY: 134 mg/dL — AB (ref 70–99)
Glucose-Capillary: 159 mg/dL — ABNORMAL HIGH (ref 70–99)
Glucose-Capillary: 194 mg/dL — ABNORMAL HIGH (ref 70–99)
Glucose-Capillary: 176 mg/dL — ABNORMAL HIGH (ref 70–99)

## 2014-04-16 LAB — HEPARIN LEVEL (UNFRACTIONATED): Heparin Unfractionated: 0.55 IU/mL (ref 0.30–0.70)

## 2014-04-16 SURGERY — PERCUTANEOUS CORONARY ROTOBLATOR INTERVENTION (PCI-R)
Anesthesia: LOCAL

## 2014-04-16 MED ORDER — HEPARIN SODIUM (PORCINE) 1000 UNIT/ML IJ SOLN
INTRAMUSCULAR | Status: AC
  Filled 2014-04-16: qty 1

## 2014-04-16 MED ORDER — VERAPAMIL HCL 2.5 MG/ML IV SOLN
INTRAVENOUS | Status: AC
  Filled 2014-04-16: qty 2

## 2014-04-16 MED ORDER — VERAPAMIL HCL 2.5 MG/ML IV SOLN
INTRAVENOUS | Status: AC
  Filled 2014-04-16: qty 4

## 2014-04-16 MED ORDER — FENTANYL CITRATE 0.05 MG/ML IJ SOLN
INTRAMUSCULAR | Status: AC
  Filled 2014-04-16: qty 2

## 2014-04-16 MED ORDER — SODIUM CHLORIDE 0.9 % IV SOLN
1.7500 mg/kg/h | INTRAVENOUS | Status: AC
  Administered 2014-04-16: 1.75 mg/kg/h via INTRAVENOUS
  Filled 2014-04-16: qty 250

## 2014-04-16 MED ORDER — BIVALIRUDIN 250 MG IV SOLR
INTRAVENOUS | Status: AC
  Filled 2014-04-16: qty 250

## 2014-04-16 MED ORDER — DEXTROSE 5 % IV SOLN
1.0000 g | INTRAVENOUS | Status: DC
  Filled 2014-04-16 (×2): qty 10

## 2014-04-16 MED ORDER — SODIUM CHLORIDE 0.9 % IV SOLN
250.0000 mL | INTRAVENOUS | Status: DC | PRN
  Administered 2014-04-17 (×2): 250 mL via INTRAVENOUS

## 2014-04-16 MED ORDER — LIDOCAINE HCL (PF) 1 % IJ SOLN
INTRAMUSCULAR | Status: AC
  Filled 2014-04-16: qty 30

## 2014-04-16 MED ORDER — DOPAMINE-DEXTROSE 3.2-5 MG/ML-% IV SOLN
INTRAVENOUS | Status: AC
  Administered 2014-04-16: 5 ug/kg/min via INTRAVENOUS
  Filled 2014-04-16: qty 250

## 2014-04-16 MED ORDER — SODIUM CHLORIDE 0.9 % IJ SOLN: 3.0000 mL | INTRAMUSCULAR | Status: DC | PRN

## 2014-04-16 MED ORDER — HEPARIN SODIUM (PORCINE) 5000 UNIT/ML IJ SOLN
5000.0000 [IU] | Freq: Three times a day (TID) | INTRAMUSCULAR | Status: DC
  Administered 2014-04-17: 5000 [IU] via SUBCUTANEOUS
  Filled 2014-04-16 (×4): qty 1

## 2014-04-16 MED ORDER — MIDAZOLAM HCL 2 MG/2ML IJ SOLN
INTRAMUSCULAR | Status: AC
  Filled 2014-04-16: qty 2

## 2014-04-16 MED ORDER — SODIUM CHLORIDE 0.9 % IV SOLN: 1.0000 mL/kg/h | INTRAVENOUS | Status: AC

## 2014-04-16 MED ORDER — SODIUM CHLORIDE 0.9 % IV BOLUS (SEPSIS)
500.0000 mL | Freq: Once | INTRAVENOUS | Status: AC
  Administered 2014-04-16: 500 mL via INTRAVENOUS

## 2014-04-16 MED ORDER — SODIUM CHLORIDE 0.9 % IJ SOLN
3.0000 mL | Freq: Two times a day (BID) | INTRAMUSCULAR | Status: DC
  Administered 2014-04-17 – 2014-04-20 (×5): 3 mL via INTRAVENOUS

## 2014-04-16 MED ORDER — HEPARIN (PORCINE) IN NACL 2-0.9 UNIT/ML-% IJ SOLN
INTRAMUSCULAR | Status: AC
  Filled 2014-04-16: qty 1000

## 2014-04-16 MED ORDER — NITROGLYCERIN 0.2 MG/ML ON CALL CATH LAB
INTRAVENOUS | Status: AC
  Filled 2014-04-16: qty 1

## 2014-04-16 MED ORDER — DOPAMINE-DEXTROSE 3.2-5 MG/ML-% IV SOLN
2.0000 ug/kg/min | INTRAVENOUS | Status: DC
  Administered 2014-04-16: 5 ug/kg/min via INTRAVENOUS
  Administered 2014-04-17 (×2): 6 ug/kg/min via INTRAVENOUS
  Filled 2014-04-16: qty 250

## 2014-04-16 NOTE — Progress Notes (Signed)
Patient Profile 73 year old woman with multiple medical problems including severe COPD. She has had recurrent hospital admissions with congestive heart failure over the last several months. She's had non-ST elevation infarction and has been found to have severe proximal LAD stenosis, ulcerated stenosis of the left mainstem, and involvement of the proximal left circumflex. The patient was evaluated by cardiac surgery and she was felt to be a nonoperative candidate because of her comorbid medical conditions. Further complicating things is a history of chronic anemia. She has undergone upper endoscopy without obvious signs of bleeding. Colonoscopy has been planned but she has not been able to proceed with this because of her recent acute illnesses. Further complicating matters, the patient notes that she is unable to take Plavix because she had severe burning of her skin when she took Plavix for 30 days after peripheral artery stenting.   After discussion and review of options, which include only palliative medical therapy or high-risk PCI, it has been decided to proceed with PCI using hemodynamic support. Plan is for rotational atherectomy of the left main and proximal LAD using Impella support. She may also require bifurcational stenting depending on the involvement of the left circumflex at the time of PCI. She understands that bailout CABG will absolutely not be an option if there is catastrophic complication. Her estimated her risk of major complication such as large MI or death at approximately 10%. She understands and agrees to proceed.   As she is intolerant to Plavix, she is being treated with Brilinta WITHOUT aspirin since she has chronic anemia and GI blood loss.   Subjective: Denies chest pain. A bit nervous about cath today.   Objective: Vital signs in last 24 hours: Temp:  [98 F (36.7 C)-98.7 F (37.1 C)] 98.7 F (37.1 C) (05/20 0540) Pulse Rate:  [55-60] 59 (05/20 0540) Resp:  [18-20]  18 (05/20 0540) BP: (112-127)/(64-74) 127/65 mmHg (05/20 0540) SpO2:  [97 %-100 %] 100 % (05/20 0540) FiO2 (%):  [28 %] 28 % (05/19 2030) Weight:  [162 lb 7.7 oz (73.7 kg)] 162 lb 7.7 oz (73.7 kg) (05/20 0540) Last BM Date: 04/15/14  Intake/Output from previous day: 05/19 0701 - 05/20 0700 In: 504 [P.O.:360; I.V.:144] Out: 3820 [Urine:3820] Intake/Output this shift:    Medications Current Facility-Administered Medications  Medication Dose Route Frequency Provider Last Rate Last Dose  . 0.9 %  sodium chloride infusion   Intravenous Continuous Allie Bossier, MD 10 mL/hr at 04/14/14 0601    . 0.9 %  sodium chloride infusion  250 mL Intravenous PRN Tarri Fuller, PA-C      . 0.9 %  sodium chloride infusion   Intravenous Continuous Tarri Fuller, PA-C 10 mL/hr at 04/14/14 0602 10 mL/hr at 04/14/14 0602  . 0.9 %  sodium chloride infusion  250 mL Intravenous PRN [name], MD      . 0.9 %  sodium chloride infusion   Intravenous Continuous [name], MD      . 0.9 %  sodium chloride infusion  250 mL Intravenous PRN Rhonda G Barrett, PA-C      . 0.9 %  sodium chloride infusion   Intravenous Continuous Evelene Croon Barrett, PA-C 10 mL/hr at 04/16/14 [phone]    . acetaminophen (TYLENOL) tablet 650 mg  650 mg Oral Q4H PRN Jeryl Columbia, NP   650 mg at 04/15/14 0203  . ALPRAZolam (XANAX) tablet 0.25 mg  0.25 mg Oral QHS PRN Allyne Gee, MD   0.25 mg at  04/15/14 0131  . alum & mag hydroxide-simeth (MAALOX/MYLANTA) 200-200-20 MG/5ML suspension 15 mL  15 mL Oral TID Jessee Avers, MD   15 mL at 04/15/14 2202  . alum & mag hydroxide-simeth (MAALOX/MYLANTA) 200-200-20 MG/5ML suspension 30 mL  30 mL Oral Q6H PRN Lorretta Harp, MD      . antiseptic oral rinse (BIOTENE) solution 15 mL  15 mL Mouth Rinse BID Allie Bossier, MD   15 mL at 04/16/14 0800  . atorvastatin (LIPITOR) tablet 20 mg  20 mg Oral q1800 Cherene Altes, MD   20 mg at 04/15/14 1733  . brimonidine (ALPHAGAN) 0.2 % ophthalmic  solution 1 drop  1 drop Right Eye TID Doree Fudge, MD   1 drop at 04/15/14 2200   And  . brinzolamide (AZOPT) 1 % ophthalmic suspension 1 drop  1 drop Right Eye TID Doree Fudge, MD   1 drop at 04/15/14 2159  . budesonide (PULMICORT) nebulizer solution 0.25 mg  0.25 mg Nebulization BID Brand Males, MD   0.25 mg at 04/15/14 2029  . carvedilol (COREG) tablet 3.125 mg  3.125 mg Oral BID WC Arnoldo Lenis, MD   3.125 mg at 04/16/14 0981  . cycloSPORINE (RESTASIS) 0.05 % ophthalmic emulsion 1 drop  1 drop Both Eyes BID Doree Fudge, MD   1 drop at 04/15/14 2201  . diazepam (VALIUM) tablet 5 mg  5 mg Oral On Call Rhonda G Barrett, PA-C      . furosemide (LASIX) tablet 40 mg  40 mg Oral BID Lorretta Harp, MD   40 mg at 04/16/14 1914  . heparin ADULT infusion 100 units/mL (25000 units/250 mL)  1,200 Units/hr Intravenous Continuous Georgina Peer, [facility] 12 mL/hr at 04/15/14 1739 1,200 Units/hr at 04/15/14 1739  . insulin aspart (novoLOG) injection 0-15 Units  0-15 Units Subcutaneous TID AC & HS Brand Males, MD   2 Units at 04/16/14 [phone]  . insulin glargine (LANTUS) injection 10 Units  10 Units Subcutaneous QHS Allyne Gee, MD   10 Units at 04/15/14 2211  . ipratropium-albuterol (DUONEB) 0.5-2.5 (3) MG/3ML nebulizer solution 3 mL  3 mL Nebulization Q4H PRN Doree Fudge, MD   3 mL at 04/15/14 0228  . ipratropium-albuterol (DUONEB) 0.5-2.5 (3) MG/3ML nebulizer solution 3 mL  3 mL Nebulization BID Allyne Gee, MD   3 mL at 04/15/14 2029  . latanoprost (XALATAN) 0.005 % ophthalmic solution 1 drop  1 drop Both Eyes QHS Doree Fudge, MD   1 drop at 04/15/14 2200  . levofloxacin (LEVAQUIN) tablet 500 mg  500 mg Oral Q24H Crystal Royston, RPH   500 mg at 04/15/14 1622  . lisinopril (PRINIVIL,ZESTRIL) tablet 2.5 mg  2.5 mg Oral Daily Jessee Avers, MD   2.5 mg at 04/15/14 1104  . morphine 2 MG/ML injection 1-2 mg  1-2 mg  Intravenous Q2H PRN Samella Parr, NP      . pantoprazole (PROTONIX) EC tablet 40 mg  40 mg Oral Q1200 Brand Males, MD   40 mg at 04/15/14 1226  . polyethylene glycol (MIRALAX / GLYCOLAX) packet 17 g  17 g Oral BID Jessee Avers, MD   17 g at 04/11/14 2133  . predniSONE (DELTASONE) tablet 20 mg  20 mg Oral Q breakfast Allyne Gee, MD   20 mg at 04/16/14 5621  . senna-docusate (Senokot-S) tablet 2 tablet  2 tablet Oral BID Brand Males, MD   2 tablet at 04/11/14 2127  .  sodium chloride 0.9 % injection 3 mL  3 mL Intravenous Q12H Tarri Fuller, PA-C   3 mL at 04/15/14 2209  . sodium chloride 0.9 % injection 3 mL  3 mL Intravenous PRN Tarri Fuller, PA-C      . sodium chloride 0.9 % injection 3 mL  3 mL Intravenous Q12H [name], MD   3 mL at 04/15/14 1105  . sodium chloride 0.9 % injection 3 mL  3 mL Intravenous PRN [name], MD      . sodium chloride 0.9 % injection 3 mL  3 mL Intravenous Q12H Rhonda G Barrett, PA-C   3 mL at 04/15/14 2208  . sodium chloride 0.9 % injection 3 mL  3 mL Intravenous PRN Evelene Croon Barrett, PA-C      . ticagrelor (BRILINTA) tablet 90 mg  90 mg Oral BID [name], MD   90 mg at 04/16/14 7062    PE: General appearance: alert, cooperative and no distress Lungs: decreased BS bilaterally with faint right sided basilar rales Heart: regular rate and rhythm Extremities: no LEE; cool distal extremities Skin: warm and dry Neurologic: Grossly normal  Lab Results:   Recent Labs  04/14/14 0425 04/15/14 0525  WBC 11.2* 12.2*  HGB 8.8* 10.6*  HCT 29.9* 35.8*  PLT 197 214   BMET  Recent Labs  04/14/14 0425 04/15/14 0525 04/16/14 0505  NA 139 142 141  K 3.7 3.4* 4.0  CL 98 100 102  CO2 32 31 29  GLUCOSE 123* 109* 115*  BUN 25* 24* 20  CREATININE 1.07 1.04 1.06  CALCIUM 9.1 9.8 9.5    Assessment/Plan  Principal Problem:   Acute respiratory failure with hypoxia Active Problems:   DIABETES-TYPE 2   ANEMIA, IRON DEFICIENCY    HYPERTENSION   Psoriatic arthritis   Acute exacerbation of chronic obstructive pulmonary disease (COPD)   GI bleeding   Acute on chronic combined systolic and diastolic CHF (congestive heart failure)   STEMI (ST elevation myocardial infarction)   Constipation   Pulmonary hypertension   UTI (urinary tract infection)  1. CAD: severe proximal LAD stenosis. Not a surgical candidate. Plan for rotational atherectomy of the left main and proximal LAD using Impella support today. She has been NPO since midnight. Renal function stable. BP stable.   2. A/C systolic/diastolic CHF: still with faint right sided rales. Continue BID Lasix, BB, ACE-I   LOS: 11 days    Brittainy M. Ladoris Gene 04/16/2014 8:25 AM  Agree with note written by Ellen Henri  PAC  For high risk percutaneous intervention radially with Impella hemodynamic support vis LCFA this AM by Dr. Burt Knack. SOB improved after increased dose of lasix with good diuresis. Exam benign. VSS. Labs OK. On Brilenta.  Lorretta Harp 04/16/2014 9:31 AM

## 2014-04-16 NOTE — Progress Notes (Signed)
ANTICOAGULATION CONSULT NOTE - Follow Up Consult  Pharmacy Consult for Heparin/Levaquin Indication: chest pain/ACS + UTI  Allergies  Allergen Reactions  . Amlodipine Besy-Benazepril Hcl Other (See Comments)    Nervous/shakiness  . Statins Other (See Comments)    "can't take any of them; cramp me up" (12/06/2012)  . Tradjenta [Linagliptin] Other (See Comments)    Extreme muscle pains, chest and back pains  . Plavix [Clopidogrel] Rash    Severe burning of the skin.Pt states that the doctor told her that her rash was psoriasis and not related to plavix but she states the rash came from the plavix  . Raptiva [Efalizumab] Rash    Patient Measurements: Height: 5\' 4"  (162.6 cm) Weight: 162 lb 7.7 oz (73.7 kg) (a) IBW/kg (Calculated) : 54.7 Heparin Dosing Weight:  71 kg  Vital Signs: Temp: 98.7 F (37.1 C) (05/20 0540) Temp src: Oral (05/20 0540) BP: 127/65 mmHg (05/20 0540) Pulse Rate: 59 (05/20 0540)  Labs:  Recent Labs  04/14/14 0425 04/15/14 0525 04/16/14 0505  HGB 8.8* 10.6*  --   HCT 29.9* 35.8*  --   PLT 197 214  --   HEPARINUNFRC 0.41 0.53 0.55  CREATININE 1.07 1.04 1.06    Estimated Creatinine Clearance: 45.8 ml/min (by C-G formula based on Cr of 1.06).   Assessment: 73 yo F presented to [facility] ED in respiratory distress and was intubated, had elevated trops, EKG abnormal but no changes from prior. Cath 9/47 revealed complicated anatomy, surgery recommends PCI or medical therapy, not a good CABG candidate. Heparin is therapeutic again. Awaiting for PCI.   Plan:  Cont heparin at 1200 units/hr F/u with cath and PCI plan

## 2014-04-16 NOTE — Progress Notes (Signed)
Dr. Burt Knack notified of low blood pressure requiring increasing dopamine.  Order received to not pull venous sheath until blood pressure is stable.

## 2014-04-16 NOTE — Progress Notes (Addendum)
Dr. Burt Knack notified of low blood pressures, pt awake, alert, oriented, denies pain.  Orders received.  Will continue to monitor pt closely.  Per Dr. Burt Knack, do not pull venous sheath at present time.

## 2014-04-16 NOTE — Interval H&P Note (Signed)
History and Physical Interval Note:  04/16/2014 10:56 AM  [name]  has presented today for surgery, with the diagnosis of CAD  The various methods of treatment have been discussed with the patient and family. After consideration of risks, benefits and other options for treatment, the patient has consented to  Procedure(s): PERCUTANEOUS CORONARY ROTOBLATOR INTERVENTION (PCI-R) (N/A) as a surgical intervention .  The patient's history has been reviewed, patient examined, no change in status, stable for surgery.  I have reviewed the patient's chart and labs.  Questions were answered to the patient's satisfaction.    Cath Lab Visit (complete for each Cath Lab visit)  Clinical Evaluation Leading to the Procedure:   ACS: yes  Non-ACS:    Anginal Classification: CCS IV  Anti-ischemic medical therapy: Minimal Therapy (1 class of medications)  Non-Invasive Test Results: No non-invasive testing performed  Prior CABG: No previous CABG       [name]

## 2014-04-16 NOTE — CV Procedure (Signed)
CARDIAC CATH NOTE  Name: [name] MRN: [id number] DOB: Jul 12, 1941  Procedure: Impella insertion, IVUS, rotational atherectomy of the LAD, PTCA and stenting of the proximal and mid-LAD, Preclose of the RFA  Primary Operator: Dr Burt Knack Assisting: Dr Ellyn Hack  Indication: 73 year-old woman with severe CAD involving the left main, LAD, LCx. She has had repeated hospitalizations for heart failure and NSTEMI. Because of multiple co-morbidities she was turned down for CABG. After careful discussion we elected to proceed with high-risk PCI using hemodynamic support and rotational atherectomy.  Procedural Details: The right wrist was prepped, draped, and anesthetized with 1% lidocaine. Using the modified Seldinger technique, a 6 Fr sheath was introduced into the right radial artery. 3 mg verapamil was administered through the radial sheath. The left groin was prepped and draped for Impella insertion. Using a micropuncture kit, the LFA was entered via a front wall puncture. A femoral angiogram was performed and demonstrated access in the left femoral artery. Using double Perclose, the LFA was Preclosed. The first Perclose did not deploy properly so a second and third suture were deployed. A 14 Fr Impella sheath was advanced over a stiff guidewire without difficulty. A Pigtail catheter was advanced into the LV and changed out for the Impella catheter. The Impella gave an output of approximately 3.0 liters throughout the procedure.  Weight-based bivalirudin was given for anticoagulation. Once a therapeutic ACT was achieved, a 6 [place] CLS guide catheter was inserted.  A 300 cm [place]oug[place] coronary guidewire was used to cross the lesion.  This was changed out for a rota-floppy wire. After careful review of the angiographic images, I decided to perform rotational atherectomy throughout the left main, proximal LAD (95% calcific lesion), and mid LAD (90% calcific lesion). Rotational atherectomy was performed  with a 1.5 mm burr and 1.75 mm burr. Several runs were made. IVUS was then performed and demonstrated severe circumferential calcification with tight stenosis in the proximal and mid-LAD. The left main had diffuse plaque but this appeared nonobstructive with a MLA greater than 8 square mm. I elected to proceed with PCI of the LAD and medical therapy for the left main as this lesion appeared nonobstructive. The lesion was predilated with a 2.5 mm [place] balloon throughout the proximal and mid-LAD. The mid-lesion was then stented with a 3.0x33 mm Xience Alpine DES deployed at 18 atm. Initially I had passed a 24 mm Xience stent but it did not cover the lesion so I pulled it out of the body undeployed. The proximal lesion was then stented with a 3.5x28 mm Xience Alpine DES in overlapping fashion at 16 atm. The distal stent was postdilated to high pressure with a 3.25 mm [place] balloon to 20 atm.  The proximal stent was post-dilated with a 3.75 mm [place] balloon to 16 atm. Post-procedure IVUS demonstrated an excellent result except for the proximal 5 mm of the proximal stent which was not well-apposed. The proximal stent was then post-dilated with a 4.5 mm [place] balloon.  Following PCI, there was 0% residual stenosis and TIMI-3 flow. Final angiography confirmed an excellent result. The patient tolerated the procedure well. There were no immediate procedural complications. A TR band was used for radial hemostasis. The patient was transferred to the post catheterization recovery area for further monitoring.  Lesion Data: Lesion 1: Vessel: Proximal LAD Percent stenosis (pre): 95 (heavily calcified) TIMI-flow (pre):  3 Stent:  3.5x28 mm Xience DES Percent stenosis (post): 0 TIMI-flow (post): 3  Lesion 2: Vessel:  Mid-LAD Percent stenosis (pre): 90 TIMI-flow (pre):  3 Stent:  3.0x33 Xience DES Percent stenosis (post): 0 TIMI-flow (post): 3  Conclusions: Successful Complex PCI of the proximal and mid-LAD with hemodynamic  support using Impella CP and adjunctive rotational atherectomy. The left main was not treated as the minimal left main area was adequate.   Recommendations: Brilinta alone as tolerated for 12 months, then change to ASA 81 mg daily.  Note: case was complex/difficult because of complex anatomy with severe diffuse calcification and tortuosity of the LAD, need for extensive lesion prep with rotational atherectomy and multiple balloon inflations.  [name] 04/16/2014, 2:01 PM

## 2014-04-16 NOTE — Progress Notes (Signed)
Post-PCI note:  Pt with hypotension after arrival to floor. BP was noted to be in the 56'Y systolic. Advised start dopamine 5 mcg and titrate to SBP > 90 mmHg and give NS bolus 500 cc.   On my eval, pt is alert, oriented, in no distress. Denies CP or dyspnea. Right groin site is clear without tenderness or hematoma. No flank tenderness.   I suspect she has some LV stunning after rotational atherectomy. Overall she appears stable. Will continue to follow closely with repeat labs in the am. Will try to wean dopa off as tolerated.  [name] 04/16/2014 3:29 PM

## 2014-04-16 NOTE — H&P (View-Only) (Signed)
Patient Profile 73-year-old woman with multiple medical problems including severe COPD. She has had recurrent hospital admissions with congestive heart failure over the last several months. She's had non-ST elevation infarction and has been found to have severe proximal LAD stenosis, ulcerated stenosis of the left mainstem, and involvement of the proximal left circumflex. The patient was evaluated by cardiac surgery and she was felt to be a nonoperative candidate because of her comorbid medical conditions. Further complicating things is a history of chronic anemia. She has undergone upper endoscopy without obvious signs of bleeding. Colonoscopy has been planned but she has not been able to proceed with this because of her recent acute illnesses. Further complicating matters, the patient notes that she is unable to take Plavix because she had severe burning of her skin when she took Plavix for 30 days after peripheral artery stenting.   After discussion and review of options, which include only palliative medical therapy or high-risk PCI, it has been decided to proceed with PCI using hemodynamic support. Plan is for rotational atherectomy of the left main and proximal LAD using Impella support. She may also require bifurcational stenting depending on the involvement of the left circumflex at the time of PCI. She understands that bailout CABG will absolutely not be an option if there is catastrophic complication. Her estimated her risk of major complication such as large MI or death at approximately 10%. She understands and agrees to proceed.   As she is intolerant to Plavix, she is being treated with Brilinta WITHOUT aspirin since she has chronic anemia and GI blood loss.   Subjective: Denies chest pain. A bit nervous about cath today.   Objective: Vital signs in last 24 hours: Temp:  [98 F (36.7 C)-98.7 F (37.1 C)] 98.7 F (37.1 C) (05/20 0540) Pulse Rate:  [55-60] 59 (05/20 0540) Resp:  [18-20]  18 (05/20 0540) BP: (112-127)/(64-74) 127/65 mmHg (05/20 0540) SpO2:  [97 %-100 %] 100 % (05/20 0540) FiO2 (%):  [28 %] 28 % (05/19 2030) Weight:  [162 lb 7.7 oz (73.7 kg)] 162 lb 7.7 oz (73.7 kg) (05/20 0540) Last BM Date: 04/15/14  Intake/Output from previous day: 05/19 0701 - 05/20 0700 In: 504 [P.O.:360; I.V.:144] Out: 3820 [Urine:3820] Intake/Output this shift:    Medications Current Facility-Administered Medications  Medication Dose Route Frequency Provider Last Rate Last Dose  . 0.9 %  sodium chloride infusion   Intravenous Continuous Curtis J Woods, MD 10 mL/hr at 04/14/14 0601    . 0.9 %  sodium chloride infusion  250 mL Intravenous PRN Bryan Hager, PA-C      . 0.9 %  sodium chloride infusion   Intravenous Continuous Bryan Hager, PA-C 10 mL/hr at 04/14/14 0602 10 mL/hr at 04/14/14 0602  . 0.9 %  sodium chloride infusion  250 mL Intravenous PRN Michael Cooper, MD      . 0.9 %  sodium chloride infusion   Intravenous Continuous Michael Cooper, MD      . 0.9 %  sodium chloride infusion  250 mL Intravenous PRN Rhonda G Barrett, PA-C      . 0.9 %  sodium chloride infusion   Intravenous Continuous Rhonda G Barrett, PA-C 10 mL/hr at 04/16/14 0639    . acetaminophen (TYLENOL) tablet 650 mg  650 mg Oral Q4H PRN Katherine P Schorr, NP   650 mg at 04/15/14 0203  . ALPRAZolam (XANAX) tablet 0.25 mg  0.25 mg Oral QHS PRN Saadat A Khan, MD   0.25 mg at   04/15/14 0131  . alum & mag hydroxide-simeth (MAALOX/MYLANTA) 200-200-20 MG/5ML suspension 15 mL  15 mL Oral TID Jessee Avers, MD   15 mL at 04/15/14 2202  . alum & mag hydroxide-simeth (MAALOX/MYLANTA) 200-200-20 MG/5ML suspension 30 mL  30 mL Oral Q6H PRN Lorretta Harp, MD      . antiseptic oral rinse (BIOTENE) solution 15 mL  15 mL Mouth Rinse BID Allie Bossier, MD   15 mL at 04/16/14 0800  . atorvastatin (LIPITOR) tablet 20 mg  20 mg Oral q1800 Cherene Altes, MD   20 mg at 04/15/14 1733  . brimonidine (ALPHAGAN) 0.2 % ophthalmic  solution 1 drop  1 drop Right Eye TID Doree Fudge, MD   1 drop at 04/15/14 2200   And  . brinzolamide (AZOPT) 1 % ophthalmic suspension 1 drop  1 drop Right Eye TID Doree Fudge, MD   1 drop at 04/15/14 2159  . budesonide (PULMICORT) nebulizer solution 0.25 mg  0.25 mg Nebulization BID Brand Males, MD   0.25 mg at 04/15/14 2029  . carvedilol (COREG) tablet 3.125 mg  3.125 mg Oral BID WC Arnoldo Lenis, MD   3.125 mg at 04/16/14 0981  . cycloSPORINE (RESTASIS) 0.05 % ophthalmic emulsion 1 drop  1 drop Both Eyes BID Doree Fudge, MD   1 drop at 04/15/14 2201  . diazepam (VALIUM) tablet 5 mg  5 mg Oral On Call Rhonda G Barrett, PA-C      . furosemide (LASIX) tablet 40 mg  40 mg Oral BID Lorretta Harp, MD   40 mg at 04/16/14 1914  . heparin ADULT infusion 100 units/mL (25000 units/250 mL)  1,200 Units/hr Intravenous Continuous Georgina Peer, [facility] 12 mL/hr at 04/15/14 1739 1,200 Units/hr at 04/15/14 1739  . insulin aspart (novoLOG) injection 0-15 Units  0-15 Units Subcutaneous TID AC & HS Brand Males, MD   2 Units at 04/16/14 [phone]  . insulin glargine (LANTUS) injection 10 Units  10 Units Subcutaneous QHS Allyne Gee, MD   10 Units at 04/15/14 2211  . ipratropium-albuterol (DUONEB) 0.5-2.5 (3) MG/3ML nebulizer solution 3 mL  3 mL Nebulization Q4H PRN Doree Fudge, MD   3 mL at 04/15/14 0228  . ipratropium-albuterol (DUONEB) 0.5-2.5 (3) MG/3ML nebulizer solution 3 mL  3 mL Nebulization BID Allyne Gee, MD   3 mL at 04/15/14 2029  . latanoprost (XALATAN) 0.005 % ophthalmic solution 1 drop  1 drop Both Eyes QHS Doree Fudge, MD   1 drop at 04/15/14 2200  . levofloxacin (LEVAQUIN) tablet 500 mg  500 mg Oral Q24H Crystal Royston, RPH   500 mg at 04/15/14 1622  . lisinopril (PRINIVIL,ZESTRIL) tablet 2.5 mg  2.5 mg Oral Daily Jessee Avers, MD   2.5 mg at 04/15/14 1104  . morphine 2 MG/ML injection 1-2 mg  1-2 mg  Intravenous Q2H PRN Samella Parr, NP      . pantoprazole (PROTONIX) EC tablet 40 mg  40 mg Oral Q1200 Brand Males, MD   40 mg at 04/15/14 1226  . polyethylene glycol (MIRALAX / GLYCOLAX) packet 17 g  17 g Oral BID Jessee Avers, MD   17 g at 04/11/14 2133  . predniSONE (DELTASONE) tablet 20 mg  20 mg Oral Q breakfast Allyne Gee, MD   20 mg at 04/16/14 5621  . senna-docusate (Senokot-S) tablet 2 tablet  2 tablet Oral BID Brand Males, MD   2 tablet at 04/11/14 2127  .  sodium chloride 0.9 % injection 3 mL  3 mL Intravenous Q12H Bryan Hager, PA-C   3 mL at 04/15/14 2209  . sodium chloride 0.9 % injection 3 mL  3 mL Intravenous PRN Bryan Hager, PA-C      . sodium chloride 0.9 % injection 3 mL  3 mL Intravenous Q12H Michael Cooper, MD   3 mL at 04/15/14 1105  . sodium chloride 0.9 % injection 3 mL  3 mL Intravenous PRN Michael Cooper, MD      . sodium chloride 0.9 % injection 3 mL  3 mL Intravenous Q12H Rhonda G Barrett, PA-C   3 mL at 04/15/14 2208  . sodium chloride 0.9 % injection 3 mL  3 mL Intravenous PRN Rhonda G Barrett, PA-C      . ticagrelor (BRILINTA) tablet 90 mg  90 mg Oral BID Michael Cooper, MD   90 mg at 04/16/14 0644    PE: General appearance: alert, cooperative and no distress Lungs: decreased BS bilaterally with faint right sided basilar rales Heart: regular rate and rhythm Extremities: no LEE; cool distal extremities Skin: warm and dry Neurologic: Grossly normal  Lab Results:   Recent Labs  04/14/14 0425 04/15/14 0525  WBC 11.2* 12.2*  HGB 8.8* 10.6*  HCT 29.9* 35.8*  PLT 197 214   BMET  Recent Labs  04/14/14 0425 04/15/14 0525 04/16/14 0505  NA 139 142 141  K 3.7 3.4* 4.0  CL 98 100 102  CO2 32 31 29  GLUCOSE 123* 109* 115*  BUN 25* 24* 20  CREATININE 1.07 1.04 1.06  CALCIUM 9.1 9.8 9.5    Assessment/Plan  Principal Problem:   Acute respiratory failure with hypoxia Active Problems:   DIABETES-TYPE 2   ANEMIA, IRON DEFICIENCY    HYPERTENSION   Psoriatic arthritis   Acute exacerbation of chronic obstructive pulmonary disease (COPD)   GI bleeding   Acute on chronic combined systolic and diastolic CHF (congestive heart failure)   STEMI (ST elevation myocardial infarction)   Constipation   Pulmonary hypertension   UTI (urinary tract infection)  1. CAD: severe proximal LAD stenosis. Not a surgical candidate. Plan for rotational atherectomy of the left main and proximal LAD using Impella support today. She has been NPO since midnight. Renal function stable. BP stable.   2. A/C systolic/diastolic CHF: still with faint right sided rales. Continue BID Lasix, BB, ACE-I   LOS: 11 days    Brittainy M. Simmons, PA-C 04/16/2014 8:25 AM  Agree with note written by Brittany Simmons  PAC  For high risk percutaneous intervention radially with Impella hemodynamic support vis LCFA this AM by Dr. Cooper. SOB improved after increased dose of lasix with good diuresis. Exam benign. VSS. Labs OK. On Brilenta.  Saafir Abdullah J Stepan Verrette 04/16/2014 9:31 AM  

## 2014-04-16 NOTE — Progress Notes (Signed)
[facility] PROGRESS NOTE  [name] ZOX:[id number] DOB: 08-May-1941 DOA: 04/05/2014 PCP: Leola Brazil, MD  Assessment/Plan: 73 y.o. female with PMH of HTN, CHF, COPD, DM, PAD, pulmonary HTN, h/o GI bleed, esophageal candidiasis and rheumatoid arthritis on chronic steroids was admitted to the critical care service with acute respiratory failure, found MI  1. Non-STEMI; per cardiology  -LHC with hisg risk PCI planned for today. -Continue brilinta. No ASA given GI bleed.   2. Acute on chronic combined CHF with ICM, pulmonary HTN PASP 71mm hg:  -LVEF 35-40% with moderately reduced systolic function and grade 1 DD with moderate hypokinesis of mid-distal inferior myocardium. Pericardial effusion.  -improving on diuresis; cont diuresis   -Is 9.8 L negative since admission.  3. Acute respiratory failure thought 2/2 to pulm edema from acute cardiac events: Initially intubated for three days.  -resolved, extubated; blood cultures NGTD;  inhalers, oxygen, cont to monitor.  4. Acute on chronic anemia (iron deficiency) with hx of 3 GIB in past.  - S/p 1 unit PRBC transfusion on 5/11; TF prn; no s/s of acute bleeding; needs OP colonoscopy   5. Acute on chronic renal failure, CKD 2--Cr trending down: 1.06 today from 1.5 on admission. -continue to trend; improved with diuresis   6. Hypertension: Initially hypotensive, now improved.  - cont current regimen   7. DM2--Last A1C 7.2 in Feb 2014. D/c metformin with CKD -SSI moderate and Lantus 10 qhs   8. UTI -Patient with dysuria and some retention. -UA dirty. -Start rocephin pending urine cx data.   Code Status: full Family Communication: Patient only. Disposition Plan: for cath today.   Consultants:  Pulmonology  Cardiology     HPI/Subjective: alert  Objective: Filed Vitals:   04/16/14 0540  BP: 127/65  Pulse: 59  Temp: 98.7 F (37.1 C)  Resp: 18    Intake/Output Summary (Last 24 hours) at  04/16/14 0944 Last data filed at 04/16/14 0542  Gross per 24 hour  Intake    504 ml  Output   3820 ml  Net  -3316 ml   Filed Weights   04/14/14 0527 04/15/14 0438 04/16/14 0540  Weight: 77.3 kg (170 lb 6.7 oz) 76.5 kg (168 lb 10.4 oz) 73.7 kg (162 lb 7.7 oz)    Exam:   General:  alert  Cardiovascular: s1,s2 rrr  Respiratory: decreased AE in R lung   Abdomen: soft, nt, nd   Musculoskeletal: no LE edema   Data Reviewed: Basic Metabolic Panel:  Recent Labs Lab 04/10/14 0138 04/11/14 0500 04/12/14 0328 04/13/14 0758 04/14/14 0425 04/15/14 0525 04/16/14 0505  NA 143 139 139 140 139 142 141  K 3.9 3.7 3.7 3.8 3.7 3.4* 4.0  CL 98 98 96 98 98 100 102  CO2 34* 30 31 30  32 31 29  GLUCOSE 119* 137* 158* 107* 123* 109* 115*  BUN 29* 31* 33* 27* 25* 24* 20  CREATININE 1.24* 1.17* 1.16* 0.98 1.07 1.04 1.06  CALCIUM 9.9 9.6 9.5 9.3 9.1 9.8 9.5  MG 2.0 2.3  --   --   --   --   --   PHOS 3.4 3.5  --   --   --   --   --    Liver Function Tests: No results found for this basename: AST, ALT, ALKPHOS, BILITOT, PROT, ALBUMIN,  in the last 168 hours No results found for this basename: LIPASE, AMYLASE,  in the last 168 hours No results found for  this basename: AMMONIA,  in the last 168 hours CBC:  Recent Labs Lab 04/10/14 0138 04/11/14 0500 04/12/14 0328 04/13/14 0758 04/14/14 0425 04/15/14 0525  WBC 12.5* 9.9 10.6* 11.1* 11.2* 12.2*  NEUTROABS 9.7* 7.0  --   --   --   --   HGB 9.3* 9.2* 9.1* 9.5* 8.8* 10.6*  HCT 31.5* 31.3* 31.4* 32.6* 29.9* 35.8*  MCV 77.4* 79.6 78.9 79.5 78.7 78.9  PLT 217 215 215 195 197 214   Cardiac Enzymes: No results found for this basename: CKTOTAL, CKMB, CKMBINDEX, TROPONINI,  in the last 168 hours BNP (last 3 results)  Recent Labs  04/08/14 0423 04/08/14 1140 04/14/14 0425  PROBNP 13832.0* 21317.0* 8743.0*   CBG:  Recent Labs Lab 04/14/14 2230 04/15/14 0604 04/15/14 1119 04/15/14 1631 04/15/14 2124  GLUCAP 210* 123* 199*  245* 254*    Recent Results (from the past 240 hour(s))  CULTURE, RESPIRATORY (NON-EXPECTORATED)     Status: None   Collection Time    04/07/14 12:16 PM      Result Value Ref Range Status   Specimen Description TRACHEAL ASPIRATE   Final   Special Requests NONE   Final   Gram Stain     Final   Value: FEW WBC PRESENT,BOTH PMN AND MONONUCLEAR     NO SQUAMOUS EPITHELIAL CELLS SEEN     NO ORGANISMS SEEN     Performed at [facility]   Culture     Final   Value: NO GROWTH     Performed at [facility]   Report Status 04/09/2014 FINAL   Final     Studies: Dg Chest 2 View  04/15/2014   CLINICAL DATA:  Cough.  EXAM: CHEST  2 VIEW  COMPARISON:  Chest x-ray 04/09/2014.  FINDINGS: Interval resolution of previously noted pleural effusions. Linear opacity at the right base is most compatible with an area of residual subsegmental atelectasis. No consolidative airspace disease. No evidence of pulmonary edema. Moderate enlargement of the cardiopericardial silhouette may reflect underlying cardiomegaly and/or the presence of a pericardial effusion. Upper mediastinal contours are within normal limits. Atherosclerosis in the thoracic aorta.  IMPRESSION: 1. Resolution of pleural effusions. 2. Enlargement of the cardiopericardial silhouette may reflect underlying cardiomegaly, however, the appearance of the cardiopericardial silhouette could suggest presence of a pericardial effusion. Echocardiographic correlation should be considered if clinically appropriate. 3. Atherosclerosis.   Electronically Signed   By: Vinnie Langton M.D.   On: 04/15/2014 04:06    Scheduled Meds: . alum & mag hydroxide-simeth  15 mL Oral TID  . antiseptic oral rinse  15 mL Mouth Rinse BID  . atorvastatin  20 mg Oral q1800  . brimonidine  1 drop Right Eye TID   And  . brinzolamide  1 drop Right Eye TID  . budesonide  0.25 mg Nebulization BID  . carvedilol  3.125 mg Oral BID WC  . cycloSPORINE  1 drop Both Eyes  BID  . diazepam  5 mg Oral On Call  . furosemide  40 mg Oral BID  . insulin aspart  0-15 Units Subcutaneous TID AC & HS  . insulin glargine  10 Units Subcutaneous QHS  . ipratropium-albuterol  3 mL Nebulization BID  . latanoprost  1 drop Both Eyes QHS  . levofloxacin  500 mg Oral Q24H  . lisinopril  2.5 mg Oral Daily  . pantoprazole  40 mg Oral Q1200  . polyethylene glycol  17 g Oral BID  . predniSONE  20 mg Oral Q breakfast  . senna-docusate  2 tablet Oral BID  . sodium chloride  3 mL Intravenous Q12H  . sodium chloride  3 mL Intravenous Q12H  . sodium chloride  3 mL Intravenous Q12H  . ticagrelor  90 mg Oral BID   Continuous Infusions: . sodium chloride 10 mL/hr at 04/14/14 0601  . sodium chloride 10 mL/hr (04/14/14 0602)  . sodium chloride    . sodium chloride 10 mL/hr at 04/16/14 1761  . heparin 1,200 Units/hr (04/15/14 1739)    Principal Problem:   Acute respiratory failure with hypoxia Active Problems:   STEMI (ST elevation myocardial infarction)   DIABETES-TYPE 2   ANEMIA, IRON DEFICIENCY   HYPERTENSION   Psoriatic arthritis   Acute exacerbation of chronic obstructive pulmonary disease (COPD)   GI bleeding   Acute on chronic combined systolic and diastolic CHF (congestive heart failure)   Constipation   Pulmonary hypertension   UTI (urinary tract infection)    Time spent: 35 minutes     [facility] Pager [phone]. If 7PM-7AM, please contact night-coverage at [email or web address], password [facility] 04/16/2014, 9:44 AM  LOS: 11 days

## 2014-04-17 DIAGNOSIS — I959 Hypotension, unspecified: Secondary | ICD-10-CM | POA: Diagnosis not present

## 2014-04-17 LAB — CBC
HCT: 25.7 % — ABNORMAL LOW (ref 36.0–46.0)
HCT: 28.8 % — ABNORMAL LOW (ref 36.0–46.0)
Hemoglobin: 7.6 g/dL — ABNORMAL LOW (ref 12.0–15.0)
Hemoglobin: 9.2 g/dL — ABNORMAL LOW (ref 12.0–15.0)
MCH: 23.3 pg — ABNORMAL LOW (ref 26.0–34.0)
MCH: 25.6 pg — ABNORMAL LOW (ref 26.0–34.0)
MCHC: 29.6 g/dL — AB (ref 30.0–36.0)
MCHC: 31.9 g/dL (ref 30.0–36.0)
MCV: 78.8 fL (ref 78.0–100.0)
MCV: 80 fL (ref 78.0–100.0)
Platelets: 225 10*3/uL (ref 150–400)
Platelets: 157 10*3/uL (ref 150–400)
RBC: 3.26 MIL/uL — ABNORMAL LOW (ref 3.87–5.11)
RBC: 3.6 MIL/uL — ABNORMAL LOW (ref 3.87–5.11)
RDW: 27.7 % — AB (ref 11.5–15.5)
RDW: 23.6 % — ABNORMAL HIGH (ref 11.5–15.5)
WBC: 14 10*3/uL — ABNORMAL HIGH (ref 4.0–10.5)
WBC: 11.8 10*3/uL — ABNORMAL HIGH (ref 4.0–10.5)

## 2014-04-17 LAB — URINE CULTURE: Colony Count: >=100000

## 2014-04-17 LAB — BASIC METABOLIC PANEL
BUN: 21 mg/dL (ref 6–23)
CHLORIDE: 101 meq/L (ref 96–112)
CO2: 27 mEq/L (ref 19–32)
CREATININE: 0.37 mg/dL — AB (ref 0.50–1.10)
Calcium: 9.1 mg/dL (ref 8.4–10.5)
GFR calc Af Amer: >90 mL/min (ref >90)
GFR calc non Af Amer: >90 mL/min (ref >90)
Glucose, Bld: 227 mg/dL — ABNORMAL HIGH (ref 70–99)
Potassium: 4.2 mEq/L (ref 3.7–5.3)
Sodium: 141 mEq/L (ref 137–147)

## 2014-04-17 LAB — GLUCOSE, CAPILLARY
GLUCOSE-CAPILLARY: 253 mg/dL — AB (ref 70–99)
GLUCOSE-CAPILLARY: 210 mg/dL — AB (ref 70–99)
Glucose-Capillary: 167 mg/dL — ABNORMAL HIGH (ref 70–99)
Glucose-Capillary: 213 mg/dL — ABNORMAL HIGH (ref 70–99)

## 2014-04-17 LAB — PREPARE RBC (CROSSMATCH)

## 2014-04-17 MED ORDER — CEPHALEXIN 500 MG PO CAPS
500.0000 mg | ORAL_CAPSULE | Freq: Two times a day (BID) | ORAL | Status: DC
  Administered 2014-04-17 – 2014-04-18 (×4): 500 mg via ORAL
  Filled 2014-04-17 (×7): qty 1

## 2014-04-17 MED FILL — Sodium Chloride IV Soln 0.9%: INTRAVENOUS | Qty: 50 | Status: AC

## 2014-04-17 NOTE — Progress Notes (Signed)
SUBJECTIVE:  73 y.o. female with a h/o hypertension, combined chronic CHF (echocardiogram 03/09/2014 EF 35% with diffuse hypokinesis and grade 1 diastolic dysfunction), COPD, diabetes, with A1c 7.2% on 01/03/2013, peripheral arterial disease, pulmonary hypertension, GI bleed, esophageal candidiasis and rheumatoid arthritis on chronic steroids was admitted on 04/05/2014 to the critical care service with acute respiratory failure requiring intubation for 4 days. Cardiology was consulted at that time due to ?STEMI with elevated troponins and EKG with ST segment elevations lead in V2, V3 and V4. These changes were present on her prior EKG of 03/17/2014 but are not more prominent.  04/09/2014 she underwent left heart catheterization, which revealed three-vessel disease involving proximal and mid LAD, severe distal left main, ostial circumflex, and immediate RCA. PCA was not feasible due to complex coronary anatomy. Surgical revascularization was recommended , but the patient was a poor surgical candidate. Medical therapy versus PCI was contemplated.   Subsequently, on 04/16/2014 she underwent Impella insertion, IVUS, rotational atherectomy of the LAD, PTCA and stenting of the proximal and mid-LAD, Preclose of the RFA by Dr. Burt Knack. Following her catheterization, patient developed low blood pressure after arrival on the floor with a systolic of 42V. She received 500 cc of IV normal saline and a dopamine drip was initiated. Patient was transferred to cardiac ICU.    She complains of gas in her abdomen. Last bowel movement yesterday morning. No nausea or vomiting.  Marland Kitchen antiseptic oral rinse  15 mL Mouth Rinse BID  . atorvastatin  20 mg Oral q1800  . brimonidine  1 drop Right Eye TID   And  . brinzolamide  1 drop Right Eye TID  . budesonide  0.25 mg Nebulization BID  . carvedilol  3.125 mg Oral BID WC  . cefTRIAXone (ROCEPHIN)  IV  1 g Intravenous Q24H  . cycloSPORINE  1 drop Both Eyes BID  . furosemide  40  mg Oral BID  . heparin  5,000 Units Subcutaneous 3 times per day  . insulin aspart  0-15 Units Subcutaneous TID AC & HS  . insulin glargine  10 Units Subcutaneous QHS  . ipratropium-albuterol  3 mL Nebulization BID  . latanoprost  1 drop Both Eyes QHS  . levofloxacin  500 mg Oral Q24H  . lisinopril  2.5 mg Oral Daily  . pantoprazole  40 mg Oral Q1200  . polyethylene glycol  17 g Oral BID  . predniSONE  20 mg Oral Q breakfast  . senna-docusate  2 tablet Oral BID  . sodium chloride  3 mL Intravenous Q12H  . ticagrelor  90 mg Oral BID   . DOPamine 6 mcg/kg/min (04/17/14 0400)   PHYSICAL EXAM Filed Vitals:   04/17/14 0438 04/17/14 0500 04/17/14 0600 04/17/14 0700  BP:  79/54 99/70   Pulse:  74 73 84  Temp:      TempSrc:      Resp:  17 16 27   Height:      Weight: 159 lb 2.8 oz (72.2 kg)     SpO2:  97% 95% 97%   General: lying in bed, no acute distress.  HEENT: EOMI  Neck: JVP not appreciable  Cardiovascular: RRR  Lungs: b/l rales, mild expiratory wheeze  Abdomen: mild upper abdominal tenderness, distended with voluntary guarding, +bs.  Musculoskeletal: Moves all extremities, no edema, hyperpigmented, bilateral, lower extremities related to PVD. Left Midfoot amputation.  Neuro: alert, awake, and oriented  LABS: Lab Results  Component Value Date   TROPONINI >20.00* 04/05/2014   Results  for orders placed during the hospital encounter of 04/05/14 (from the past 24 hour(s))  GLUCOSE, CAPILLARY     Status: Abnormal   Collection Time    04/16/14 10:33 AM      Result Value Ref Range   Glucose-Capillary 159 (*) 70 - 99 mg/dL  POCT ACTIVATED CLOTTING TIME     Status: None   Collection Time    04/16/14 11:41 AM      Result Value Ref Range   Activated Clotting Time 282    POCT ACTIVATED CLOTTING TIME     Status: None   Collection Time    04/16/14 12:15 PM      Result Value Ref Range   Activated Clotting Time 287    POCT I-STAT, CHEM 8     Status: Abnormal   Collection Time     04/16/14  1:56 PM      Result Value Ref Range   Sodium 136 (*) 137 - 147 mEq/L   Potassium 4.5  3.7 - 5.3 mEq/L   Chloride 99  96 - 112 mEq/L   BUN 19  6 - 23 mg/dL   Creatinine, Ser 1.20 (*) 0.50 - 1.10 mg/dL   Glucose, Bld 229 (*) 70 - 99 mg/dL   Calcium, Ion 1.24  1.13 - 1.30 mmol/L   TCO2 26  0 - 100 mmol/L   Hemoglobin 9.2 (*) 12.0 - 15.0 g/dL   HCT 27.0 (*) 36.0 - 46.0 %  CBC     Status: Abnormal   Collection Time    04/16/14  3:00 PM      Result Value Ref Range   WBC 13.5 (*) 4.0 - 10.5 K/uL   RBC 3.45 (*) 3.87 - 5.11 MIL/uL   Hemoglobin 8.2 (*) 12.0 - 15.0 g/dL   HCT 27.3 (*) 36.0 - 46.0 %   MCV 79.1  78.0 - 100.0 fL   MCH 23.8 (*) 26.0 - 34.0 pg   MCHC 30.0  30.0 - 36.0 g/dL   RDW 27.6 (*) 11.5 - 15.5 %   Platelets 240  150 - 400 K/uL  GLUCOSE, CAPILLARY     Status: Abnormal   Collection Time    04/16/14  3:31 PM      Result Value Ref Range   Glucose-Capillary 194 (*) 70 - 99 mg/dL  POCT ACTIVATED CLOTTING TIME     Status: None   Collection Time    04/16/14  3:40 PM      Result Value Ref Range   Activated Clotting Time 397    BASIC METABOLIC PANEL     Status: Abnormal   Collection Time    04/16/14  4:57 PM      Result Value Ref Range   Sodium 141  137 - 147 mEq/L   Potassium 5.0  3.7 - 5.3 mEq/L   Chloride 103  96 - 112 mEq/L   CO2 29  19 - 32 mEq/L   Glucose, Bld 244 (*) 70 - 99 mg/dL   BUN 21  6 - 23 mg/dL   Creatinine, Ser 1.13 (*) 0.50 - 1.10 mg/dL   Calcium 8.9  8.4 - 10.5 mg/dL   GFR calc non Af Amer 47 (*) >90 mL/min   GFR calc Af Amer 54 (*) >90 mL/min  CBC     Status: Abnormal   Collection Time    04/16/14  6:31 PM      Result Value Ref Range   WBC 13.9 (*) 4.0 - 10.5 K/uL  RBC 3.59 (*) 3.87 - 5.11 MIL/uL   Hemoglobin 8.5 (*) 12.0 - 15.0 g/dL   HCT 80.1 (*) 65.5 - 37.4 %   MCV 79.4  78.0 - 100.0 fL   MCH 23.7 (*) 26.0 - 34.0 pg   MCHC 29.8 (*) 30.0 - 36.0 g/dL   RDW 82.7 (*) 07.8 - 67.5 %   Platelets 250  150 - 400 K/uL  GLUCOSE,  CAPILLARY     Status: Abnormal   Collection Time    04/16/14  8:22 PM      Result Value Ref Range   Glucose-Capillary 176 (*) 70 - 99 mg/dL   Comment 1 Notify RN    BASIC METABOLIC PANEL     Status: Abnormal   Collection Time    04/17/14  5:20 AM      Result Value Ref Range   Sodium 141  137 - 147 mEq/L   Potassium 4.2  3.7 - 5.3 mEq/L   Chloride 101  96 - 112 mEq/L   CO2 27  19 - 32 mEq/L   Glucose, Bld 227 (*) 70 - 99 mg/dL   BUN 21  6 - 23 mg/dL   Creatinine, Ser 4.49 (*) 0.50 - 1.10 mg/dL   Calcium 9.1  8.4 - 20.1 mg/dL   GFR calc non Af Amer >90  >90 mL/min   GFR calc Af Amer >90  >90 mL/min  CBC     Status: Abnormal   Collection Time    04/17/14  5:20 AM      Result Value Ref Range   WBC 14.0 (*) 4.0 - 10.5 K/uL   RBC 3.26 (*) 3.87 - 5.11 MIL/uL   Hemoglobin 7.6 (*) 12.0 - 15.0 g/dL   HCT 00.7 (*) 12.1 - 97.5 %   MCV 78.8  78.0 - 100.0 fL   MCH 23.3 (*) 26.0 - 34.0 pg   MCHC 29.6 (*) 30.0 - 36.0 g/dL   RDW 88.3 (*) 25.4 - 98.2 %   Platelets 225  150 - 400 K/uL    Intake/Output Summary (Last 24 hours) at 04/17/14 0741 Last data filed at 04/17/14 0700  Gross per 24 hour  Intake 1472.53 ml  Output   2900 ml  Net -1427.47 ml    EKG:  High lateral T wave inversions, otherwise, normal EKG.  ASSESSMENT AND PLAN:  Principal Problem:   Acute respiratory failure with hypoxia Active Problems:   DIABETES-TYPE 2   ANEMIA, IRON DEFICIENCY   HYPERTENSION   Psoriatic arthritis   Acute exacerbation of chronic obstructive pulmonary disease (COPD)   GI bleeding   Acute on chronic combined systolic and diastolic CHF (congestive heart failure)   STEMI (ST elevation myocardial infarction)   Constipation   Pulmonary hypertension   UTI (urinary tract infection)   Hypotension   Hypotension: Blood pressure remained soft, with systolic between 64B and 90s. Diastolic 40s to 50s. Possibly due to some LV stunning after rotational atherectomy but also likely volume depletion  with NPO and diuretics. Patient otherwise feels clinically well. Plan. -Continue with dopamine drip and titrate off - allow  - Consider IV bolus as needed but with caution due to sCHF.  - will hold Lasix for for now  - sheath still in place  Acute blood loss Anemia: Chronic with component of acute loss with cath. Hg 8.5 >7.6 Plan  - consider transfusion - goal >7.5 - on Brilinta. ASA - Plts are normal. No active bleeding  Non-STEMI; per cardiology. Status  post high-risk PCI using hemodynamic support, and rotational  Atherectomy. Successful complex PCI of proximal and mid LAD using Impella.  Plan  - cont with Brilinta alone for 12 months then change to ASA 81 mg daily ( high risk for GI bleed prohibiting dual antiplatelet therapy)  Acute on chronic combined CHF with ICM, pulmonary HTN PASP 59mm hg:  -LVEF 35-40% with moderately reduced systolic function and grade 1 DD with moderate hypokinesis of mid-distal inferior myocardium. Pericardial effusion.  - has been on diuresis with  -11 L negative since admission.  - will hold diuretics for now  Acute respiratory failure thought 2/2 to pulm edema from acute cardiac events: Initially intubated for three days.  -resolved, extubated; blood cultures NGTD; inhalers, oxygen, cont to monitor.   Acute on chronic anemia (iron deficiency) with hx of 3 GIB in past.  - S/p 1 unit PRBC transfusion on 5/11; TF prn; no s/s of acute bleeding; needs OP colonoscopy as outpatient  Acute on chronic renal failure, CKD 2--Cr trending down: 1.06 today from 1.5 on admission.  -continue to trend; improved with diuresis   Hypertension: Initially hypotensive, now improved.  - cont current regimen   DM2--Last A1C 7.2 in Feb 2014. D/c metformin with CKD  -SSI moderate and Lantus 10 qhs   UTI  -Patient with dysuria and some retention.  -UA dirty.  -on rocephin  - urine cx pending  Abdominal pain: This has been a chronic problem with possible constipation  and dyspepsia. She complains of gas and bloating. Will try simethicone.    Discussed with Dr Meda Coffee  Signed:  Jessee Avers, MD PGY-2 Internal Medicine Teaching Service Pager: [phone] 04/17/2014, 75:64 AM   73 year-old woman with severe CAD involving the left main, LAD, LCx. She has had repeated hospitalizations for heart failure and NSTEMI. Because of multiple co-morbidities she was turned down for CABG. After careful discussion we elected to proceed with high-risk PCI using hemodynamic support and rotational atherectomy. She underwent a successful Complex PCI of the proximal and mid-LAD with hemodynamic support using Impella CP and adjunctive rotational atherectomy. The left main was not treated as the minimal left main area was adequate. She was started on Brillinta only as she has h/o GI bleed and positive FOBT. The plan is is to start Brilinta alone as tolerated for 12 months, then change to ASA 81 mg daily. The patient had significant blood loss yesterday and was hypotensive post procedure. She was started on Dopamine but feels very weak, nauseous and is still hypotensive.  Hb dropped from 8.5--> 7.6. So far no concern for retroperitoneal bleed, we will hold on abdominal CT. We will hold lasix as she is rather dry and hold lisinopril.  We will transfuse her 2 units. Will try to wean dopa off as tolerated.  She still has sheeth in place, cath lab will remove.    Dorothy Spark 04/17/2014

## 2014-04-17 NOTE — Progress Notes (Signed)
[facility] PROGRESS NOTE  [name] HYQ:[id number] DOB: 31-Oct-1941 DOA: 04/05/2014 PCP: Leola Brazil, MD  Assessment/Plan: 73 y.o. female with PMH of HTN, CHF, COPD, DM, PAD, pulmonary HTN, h/o GI bleed, esophageal candidiasis and rheumatoid arthritis on chronic steroids was admitted to the critical care service with acute respiratory failure, found to have an acute MI.  1. Non-STEMI; per cardiology  -LHC with hisg risk PCI planned for today. -Continue brilinta. No ASA given GI bleed.  -Became hypotensive after cath and required transfer to ICU on dopamine drip.  2. Acute on chronic combined CHF with ICM, pulmonary HTN PASP 49mm hg:  -LVEF 35-40% with moderately reduced systolic function and grade 1 DD with moderate hypokinesis of mid-distal inferior myocardium. Pericardial effusion.  -improving on diuresis; cont diuresis   -Is 11.1 L negative since admission.  3. Acute respiratory failure thought 2/2 to pulm edema from acute cardiac events: Initially intubated for three days.  -resolved, extubated; blood cultures NGTD;  inhalers, oxygen, cont to monitor.  4. Acute on chronic anemia (iron deficiency) with hx of 3 GIB in past.  - S/p 1 unit PRBC transfusion on 5/11; TF prn; no s/s of acute bleeding; needs OP colonoscopy   5. Acute on chronic renal failure, CKD 2--Cr trending down: 1.06 today from 1.5 on admission. -continue to trend; improved with diuresis   6. Hypertension: Initially hypotensive, now improved.  - cont current regimen   7. DM2--Last A1C 7.2 in Feb 2014. D/c metformin with CKD -SSI moderate and Lantus 10 qhs . -Fair control. -Continue current regimen.  8. UTI -Cx with E Coli. -Will transition to PO keflex for 5 days.   Code Status: full Family Communication: Patient only. Disposition Plan: Discussed with cardiology, Dr. Meda Coffee. They will assume primary care and we will remain on board for her DM and  UTI.   Consultants:  Pulmonology  Cardiology     HPI/Subjective: alert  Objective: Filed Vitals:   04/17/14 1115  BP: 127/59  Pulse: 72  Temp:   Resp: 22    Intake/Output Summary (Last 24 hours) at 04/17/14 1120 Last data filed at 04/17/14 1100  Gross per 24 hour  Intake 1849.93 ml  Output   3125 ml  Net -1275.07 ml   Filed Weights   04/16/14 0540 04/16/14 1430 04/17/14 0438  Weight: 73.7 kg (162 lb 7.7 oz) 71.4 kg (157 lb 6.5 oz) 72.2 kg (159 lb 2.8 oz)    Exam:   General:  alert  Cardiovascular: s1,s2 rrr  Respiratory: decreased AE in R lung   Abdomen: soft, nt, nd   Musculoskeletal: no LE edema   Data Reviewed: Basic Metabolic Panel:  Recent Labs Lab 04/11/14 0500  04/14/14 0425 04/15/14 0525 04/16/14 0505 04/16/14 1356 04/16/14 1657 04/17/14 0520  NA 139  < > 139 142 141 136* 141 141  K 3.7  < > 3.7 3.4* 4.0 4.5 5.0 4.2  CL 98  < > 98 100 102 99 103 101  CO2 30  < > 32 31 29  --  29 27  GLUCOSE 137*  < > 123* 109* 115* 229* 244* 227*  BUN 31*  < > 25* 24* 20 19 21 21   CREATININE 1.17*  < > 1.07 1.04 1.06 1.20* 1.13* 0.37*  CALCIUM 9.6  < > 9.1 9.8 9.5  --  8.9 9.1  MG 2.3  --   --   --   --   --   --   --  PHOS 3.5  --   --   --   --   --   --   --   < > = values in this interval not displayed. Liver Function Tests: No results found for this basename: AST, ALT, ALKPHOS, BILITOT, PROT, ALBUMIN,  in the last 168 hours No results found for this basename: LIPASE, AMYLASE,  in the last 168 hours No results found for this basename: AMMONIA,  in the last 168 hours CBC:  Recent Labs Lab 04/11/14 0500  04/14/14 0425 04/15/14 0525 04/16/14 1356 04/16/14 1500 04/16/14 1831 04/17/14 0520  WBC 9.9  < > 11.2* 12.2*  --  13.5* 13.9* 14.0*  NEUTROABS 7.0  --   --   --   --   --   --   --   HGB 9.2*  < > 8.8* 10.6* 9.2* 8.2* 8.5* 7.6*  HCT 31.3*  < > 29.9* 35.8* 27.0* 27.3* 28.5* 25.7*  MCV 79.6  < > 78.7 78.9  --  79.1 79.4 78.8  PLT 215   < > 197 214  --  240 250 225  < > = values in this interval not displayed. Cardiac Enzymes: No results found for this basename: CKTOTAL, CKMB, CKMBINDEX, TROPONINI,  in the last 168 hours BNP (last 3 results)  Recent Labs  04/08/14 0423 04/08/14 1140 04/14/14 0425  PROBNP 13832.0* 21317.0* 8743.0*   CBG:  Recent Labs Lab 04/16/14 0555 04/16/14 1033 04/16/14 1531 04/16/14 2022 04/17/14 0745  GLUCAP 134* 159* 194* 176* 167*    Recent Results (from the past 240 hour(s))  CULTURE, RESPIRATORY (NON-EXPECTORATED)     Status: None   Collection Time    04/07/14 12:16 PM      Result Value Ref Range Status   Specimen Description TRACHEAL ASPIRATE   Final   Special Requests NONE   Final   Gram Stain     Final   Value: FEW WBC PRESENT,BOTH PMN AND MONONUCLEAR     NO SQUAMOUS EPITHELIAL CELLS SEEN     NO ORGANISMS SEEN     Performed at [facility]   Culture     Final   Value: NO GROWTH     Performed at [facility]   Report Status 04/09/2014 FINAL   Final  URINE CULTURE     Status: None   Collection Time    04/15/14 12:08 PM      Result Value Ref Range Status   Specimen Description URINE, CLEAN CATCH   Final   Special Requests NONE   Final   Culture  Setup Time     Final   Value: 04/15/2014 17:18     Performed at [facility]     Final   Value: >=100,000 COLONIES/ML     Performed at [facility]   Culture     Final   Value: ESCHERICHIA COLI     Performed at [facility]   Report Status 04/17/2014 FINAL   Final   Organism ID, Bacteria ESCHERICHIA COLI   Final     Studies: No results found.  Scheduled Meds: . antiseptic oral rinse  15 mL Mouth Rinse BID  . atorvastatin  20 mg Oral q1800  . brimonidine  1 drop Right Eye TID   And  . brinzolamide  1 drop Right Eye TID  . budesonide  0.25 mg Nebulization BID  . carvedilol  3.125 mg Oral BID WC  . cephALEXin  500 mg Oral Q12H  . cycloSPORINE  1 drop Both  Eyes BID  . insulin aspart  0-15 Units Subcutaneous TID AC & HS  . insulin glargine  10 Units Subcutaneous QHS  . ipratropium-albuterol  3 mL Nebulization BID  . latanoprost  1 drop Both Eyes QHS  . pantoprazole  40 mg Oral Q1200  . polyethylene glycol  17 g Oral BID  . predniSONE  20 mg Oral Q breakfast  . senna-docusate  2 tablet Oral BID  . sodium chloride  3 mL Intravenous Q12H  . ticagrelor  90 mg Oral BID   Continuous Infusions: . DOPamine 8 mcg/kg/min (04/17/14 0930)    Principal Problem:   STEMI (ST elevation myocardial infarction) Active Problems:   DIABETES-TYPE 2   ANEMIA, IRON DEFICIENCY   HYPERTENSION   Psoriatic arthritis   Acute exacerbation of chronic obstructive pulmonary disease (COPD)   GI bleeding   Acute on chronic combined systolic and diastolic CHF (congestive heart failure)   Acute respiratory failure with hypoxia   Constipation   Pulmonary hypertension   UTI (urinary tract infection)   Hypotension    Time spent: 25 minutes     [facility] Pager [phone]. If 7PM-7AM, please contact night-coverage at [email or web address], password [facility] 04/17/2014, 11:20 AM  LOS: 12 days

## 2014-04-18 LAB — TYPE AND SCREEN
ABO/RH(D): A NEG
ANTIBODY SCREEN: NEGATIVE
UNIT DIVISION: 0
Unit division: 0

## 2014-04-18 LAB — GLUCOSE, CAPILLARY
GLUCOSE-CAPILLARY: 86 mg/dL (ref 70–99)
GLUCOSE-CAPILLARY: 153 mg/dL — AB (ref 70–99)
GLUCOSE-CAPILLARY: 356 mg/dL — AB (ref 70–99)
GLUCOSE-CAPILLARY: 190 mg/dL — AB (ref 70–99)

## 2014-04-18 MED ORDER — BOOST / RESOURCE BREEZE PO LIQD
1.0000 | Freq: Two times a day (BID) | ORAL | Status: DC
  Administered 2014-04-18 – 2014-04-21 (×3): 1 via ORAL

## 2014-04-18 NOTE — Progress Notes (Addendum)
NUTRITION FOLLOW UP  Intervention:   1.  General healthful diet; encourage intake of foods and beverages as able.  RD to follow and assess for nutritional adequacy.  2.  Meals/snacks; initiate snacks BID. RD to order.   Nutrition Dx:   Inadequate oral intake, ongoing  Monitor:   1.  Food/Beverage; pt meeting >/=90% estimated needs with tolerance. 2.  Wt/wt change; monitor trends  Assessment:   Patient is a 73 yo with COPD / reactive airway disease brought in to [facility] ED in respiratory distress on 5/9, intubated.  Pt continues CHO Mod Medium diet.  Diarrhea was reported (5/14), however appears may have had 1 BM that night which is only mention of BM since admission.   Pt states she had 2 soft formed stools morning of "surgery"- suspect morning of cath? But she has since not had a BM and is reporting gas pain. Pt with chronic constipation and dyspepsia; now starting simethicone. States that constipation was what started her ongoing illness in January. She was planning for colonoscopy PTA but did not receive due to acute medical issues.   Pt with clear liquids at bedside, too much abdominal distention pain to consume much.  Is open to trying a supplement as long as it doesn't cause more pain.   RD to order [facility]   Height: Ht Readings from Last 1 Encounters:  04/16/14 5\' 4"  (1.626 m)    Weight Status:   Wt Readings from Last 1 Encounters:  04/18/14 158 lb 4.6 oz (71.8 kg)    Re-estimated needs:  Kcal: 1450-1600 Protein: 66-77g Fluid: >1.5 L/day  Skin: intact  Diet Order: Clear Liquid   Intake/Output Summary (Last 24 hours) at 04/18/14 1102 Last data filed at 04/18/14 0800  Gross per 24 hour  Intake 1136.73 ml  Output   1450 ml  Net -313.27 ml    Last BM: 5/20?  Per pt report  Labs:   Recent Labs Lab 04/16/14 0505 04/16/14 1356 04/16/14 1657 04/17/14 0520  NA 141 136* 141 141  K 4.0 4.5 5.0 4.2  CL 102 99 103 101  CO2 29  --  29 27  BUN 20 19 21 21    CREATININE 1.06 1.20* 1.13* 0.37*  CALCIUM 9.5  --  8.9 9.1  GLUCOSE 115* 229* 244* 227*    CBG (last 3)   Recent Labs  04/17/14 1604 04/17/14 2118 04/18/14 0746  GLUCAP 253* 210* 86    Scheduled Meds: . antiseptic oral rinse  15 mL Mouth Rinse BID  . atorvastatin  20 mg Oral q1800  . brimonidine  1 drop Right Eye TID   And  . brinzolamide  1 drop Right Eye TID  . budesonide  0.25 mg Nebulization BID  . carvedilol  3.125 mg Oral BID WC  . cephALEXin  500 mg Oral Q12H  . cycloSPORINE  1 drop Both Eyes BID  . insulin aspart  0-15 Units Subcutaneous TID AC & HS  . insulin glargine  10 Units Subcutaneous QHS  . ipratropium-albuterol  3 mL Nebulization BID  . latanoprost  1 drop Both Eyes QHS  . pantoprazole  40 mg Oral Q1200  . polyethylene glycol  17 g Oral BID  . predniSONE  20 mg Oral Q breakfast  . senna-docusate  2 tablet Oral BID  . sodium chloride  3 mL Intravenous Q12H  . ticagrelor  90 mg Oral BID    Continuous Infusions: . DOPamine Stopped (04/18/14 0000)    Brynda Greathouse,  MS RD LDN Clinical Inpatient Dietitian Pager: [phone] Weekend/After hours pager: [phone]

## 2014-04-18 NOTE — Progress Notes (Signed)
Inpatient Diabetes Program Recommendations  AACE/ADA: New Consensus Statement on Inpatient Glycemic Control (2013)  Target Ranges:  Prepandial:   less than 140 mg/dL      Peak postprandial:   less than 180 mg/dL (1-2 hours)      Critically ill patients:  140 - 180 mg/dL  Results for MELANY, WIESMAN (MRN [id number]) as of 04/18/2014 12:33  Ref. Range 04/17/2014 07:45 04/17/2014 11:46 04/17/2014 16:04 04/17/2014 21:18  Glucose-Capillary Latest Range: 70-99 mg/dL 167 (H) 213 (H) 253 (H) 210 (H)   Consider adding Novolog 3 units TID with meals for meal coverage per Glycemic Control Order-Set. Thank you  Raoul Pitch BSN, RN,CDE Inpatient Diabetes Coordinator [phone] (team pager)

## 2014-04-18 NOTE — Progress Notes (Signed)
SUBJECTIVE:  73 y.o. female with a h/o hypertension, combined chronic CHF (echocardiogram 03/09/2014 EF 35% with diffuse hypokinesis and grade 1 diastolic dysfunction), COPD, diabetes, with A1c 7.2% on 01/03/2013, peripheral arterial disease, pulmonary hypertension, GI bleed, esophageal candidiasis and rheumatoid arthritis on chronic steroids was admitted on 04/05/2014 to the critical care service with acute respiratory failure requiring intubation for 4 days. Cardiology was consulted at that time due to ?STEMI with elevated troponins and EKG with ST segment elevations lead in V2, V3 and V4. These changes were present on her prior EKG of 03/17/2014 but are not more prominent.  04/09/2014 she underwent left heart catheterization, which revealed three-vessel disease involving proximal and mid LAD, severe distal left main, ostial circumflex, and immediate RCA. PCA was not feasible due to complex coronary anatomy. Surgical revascularization was recommended , but the patient was a poor surgical candidate. Medical therapy versus PCI was contemplated.   Subsequently, on 04/16/2014 she underwent Impella insertion, IVUS, rotational atherectomy of the LAD, PTCA and stenting of the proximal and mid-LAD, Preclose of the RFA by Dr. Burt Knack. Following her catheterization, patient developed low blood pressure after arrival on the floor with a systolic of 76P. She received 500 cc of IV normal saline and a dopamine drip was initiated. Patient was transferred to cardiac ICU.    She feels better today. Transfused X2 yesterday.   Marland Kitchen antiseptic oral rinse  15 mL Mouth Rinse BID  . atorvastatin  20 mg Oral q1800  . brimonidine  1 drop Right Eye TID   And  . brinzolamide  1 drop Right Eye TID  . budesonide  0.25 mg Nebulization BID  . carvedilol  3.125 mg Oral BID WC  . cephALEXin  500 mg Oral Q12H  . cycloSPORINE  1 drop Both Eyes BID  . insulin aspart  0-15 Units Subcutaneous TID AC & HS  . insulin glargine  10  Units Subcutaneous QHS  . ipratropium-albuterol  3 mL Nebulization BID  . latanoprost  1 drop Both Eyes QHS  . pantoprazole  40 mg Oral Q1200  . polyethylene glycol  17 g Oral BID  . predniSONE  20 mg Oral Q breakfast  . senna-docusate  2 tablet Oral BID  . sodium chloride  3 mL Intravenous Q12H  . ticagrelor  90 mg Oral BID   . DOPamine Stopped (04/18/14 0000)   PHYSICAL EXAM Filed Vitals:   04/18/14 0300 04/18/14 0400 04/18/14 0444 04/18/14 0500  BP: 157/53 120/48 120/48 104/53  Pulse: 62 51 62 51  Temp:   97.1 F (36.2 C)   TempSrc:   Oral   Resp:   18   Height:      Weight:   158 lb 4.6 oz (71.8 kg)   SpO2: 100% 100% 100% 100%   General: lying in bed, no acute distress.  HEENT: EOMI  Neck: JVP not appreciable  Cardiovascular: RRR  Lungs: b/l rales, mild expiratory wheeze  Abdomen: mild upper abdominal tenderness, distended with voluntary guarding, +bs.  Musculoskeletal: Moves all extremities, no edema, hyperpigmented, bilateral, lower extremities related to PVD. Left Midfoot amputation.  Neuro: alert, awake, and oriented  LABS: Lab Results  Component Value Date   TROPONINI >20.00* 04/05/2014   Results for orders placed during the hospital encounter of 04/05/14 (from the past 24 hour(s))  GLUCOSE, CAPILLARY     Status: Abnormal   Collection Time    04/17/14  7:45 AM      Result Value Ref Range  Glucose-Capillary 167 (*) 70 - 99 mg/dL  GLUCOSE, CAPILLARY     Status: Abnormal   Collection Time    04/17/14 11:46 AM      Result Value Ref Range   Glucose-Capillary 213 (*) 70 - 99 mg/dL  TYPE AND SCREEN     Status: None   Collection Time    04/17/14 12:30 PM      Result Value Ref Range   ABO/RH(D) A NEG     Antibody Screen NEG     Sample Expiration 04/20/2014     Unit Number [id number]     Blood Component Type RBC LR PHER1     Unit division 00     Status of Unit ISSUED,FINAL     Transfusion Status OK TO TRANSFUSE     Crossmatch Result Compatible     Unit  Number [id number]     Blood Component Type RED CELLS,LR     Unit division 00     Status of Unit ISSUED,FINAL     Transfusion Status OK TO TRANSFUSE     Crossmatch Result Compatible    PREPARE RBC (CROSSMATCH)     Status: None   Collection Time    04/17/14  4:00 PM      Result Value Ref Range   Order Confirmation ORDER PROCESSED BY BLOOD BANK    GLUCOSE, CAPILLARY     Status: Abnormal   Collection Time    04/17/14  4:04 PM      Result Value Ref Range   Glucose-Capillary 253 (*) 70 - 99 mg/dL  GLUCOSE, CAPILLARY     Status: Abnormal   Collection Time    04/17/14  9:18 PM      Result Value Ref Range   Glucose-Capillary 210 (*) 70 - 99 mg/dL  CBC     Status: Abnormal   Collection Time    04/17/14 10:00 PM      Result Value Ref Range   WBC 11.8 (*) 4.0 - 10.5 K/uL   RBC 3.60 (*) 3.87 - 5.11 MIL/uL   Hemoglobin 9.2 (*) 12.0 - 15.0 g/dL   HCT 28.8 (*) 36.0 - 46.0 %   MCV 80.0  78.0 - 100.0 fL   MCH 25.6 (*) 26.0 - 34.0 pg   MCHC 31.9  30.0 - 36.0 g/dL   RDW 23.6 (*) 11.5 - 15.5 %   Platelets 157  150 - 400 K/uL    Intake/Output Summary (Last 24 hours) at 04/18/14 0737 Last data filed at 04/17/14 1900  Gross per 24 hour  Intake 1264.13 ml  Output    725 ml  Net 539.13 ml    EKG:  High lateral T wave inversions, otherwise, normal EKG.  ASSESSMENT AND PLAN:  Principal Problem:   STEMI (ST elevation myocardial infarction) Active Problems:   DIABETES-TYPE 2   ANEMIA, IRON DEFICIENCY   HYPERTENSION   Psoriatic arthritis   Acute exacerbation of chronic obstructive pulmonary disease (COPD)   GI bleeding   Acute on chronic combined systolic and diastolic CHF (congestive heart failure)   Acute respiratory failure with hypoxia   Constipation   Pulmonary hypertension   UTI (urinary tract infection)   Hypotension   Hypotension: Improving. Off dopamine. Possibly due to some LV stunning after rotational atherectomy but also likely volume depletion with NPO and diuretics.  Patient otherwise feels clinically well. Plan. - off dopamine  - Consider IV bolus as needed but with caution due to sCHF.  - can restarted lasix if  needed  Acute blood loss Anemia: Chronic with component of acute loss with cath. Hg 8.5 >7.6 >9.2 after 2 units of RBC Plan  - goal >7.5 - on Brilinta. ASA - Plts are normal. No active bleeding  Non-STEMI; per cardiology. Status post high-risk PCI using hemodynamic support, and rotational  Atherectomy. Successful complex PCI of proximal and mid LAD using Impella.  Plan  - cont with Brilinta alone for 12 months then change to ASA 81 mg daily ( high risk for GI bleed prohibiting dual antiplatelet therapy)  Acute on chronic combined CHF with ICM, pulmonary HTN PASP 43mm hg:  -LVEF 35-40% with moderately reduced systolic function and grade 1 DD with moderate hypokinesis of mid-distal inferior myocardium. Pericardial effusion.  - has been on diuresis with  -10 L negative since admission.   Acute respiratory failure thought 2/2 to pulm edema from acute cardiac events: Initially intubated for three days.  -resolved, extubated; blood cultures NGTD; inhalers, oxygen, cont to monitor.   Acute on chronic anemia (iron deficiency) with hx of 3 GIB in past.  - S/p 1 unit PRBC transfusion on 5/11 and another 2 units of PRBC yesterdaay ; TF prn; no s/s of acute bleeding; needs OP colonoscopy as outpatient  Acute on chronic renal failure, CKD 2--Cr trending down: 1.06 today from 1.5 on admission.  -continue to trend; improved with diuresis   Hypertension: Initially hypotensive, now improved.  - cont current regimen   DM2--Last A1C 7.2 in Feb 2014. D/c metformin with CKD  -SSI moderate and Lantus 10 qhs   UTI  -Patient with dysuria and some retention.  -UA dirty.  -on rocephin  - urine cx pending  Abdominal pain: Better today. This has been a chronic problem with possible constipation and dyspepsia. She complains of gas and bloating. Cont with  simethicone.    Discussed with Dr Meda Coffee  Signed:  Jessee Avers, MD PGY-2 Internal Medicine Teaching Service Pager: [phone] 04/18/2014, 7:37 AM   The patient was seen, examined and discussed with Jessee Avers, MD and agree as above.  73 year-old woman with severe CAD involving the left main, LAD, LCx. She has had repeated hospitalizations for heart failure and NSTEMI. Because of multiple co-morbidities she was turned down for CABG. After careful discussion we elected to proceed with high-risk PCI using hemodynamic support and rotational atherectomy. She underwent a successful Complex PCI of the proximal and mid-LAD with hemodynamic support using Impella CP and adjunctive rotational atherectomy. The left main was not treated as the minimal left main area was adequate. She was started on Brillinta only as she has h/o GI bleed and positive FOBT. The plan is is to start Brilinta alone as tolerated for 12 months, then change to ASA 81 mg daily.  The patient had significant blood loss yesterday and was hypotensive post procedure. She was started on Dopamine and transfused 2 units of PRBC yesterday. No signs of CHF. Dopamine off since midnight. Venous sheeth removed this am.  We will transfer her to the floor.   Dorothy Spark 04/18/2014

## 2014-04-18 NOTE — Progress Notes (Signed)
[facility] PROGRESS NOTE  [name] OBS:[id number] DOB: July 08, 1941 DOA: 04/05/2014 PCP: Leola Brazil, MD  Assessment/Plan: 73 y.o. female with PMH of HTN, CHF, COPD, DM, PAD, pulmonary HTN, h/o GI bleed, esophageal candidiasis and rheumatoid arthritis on chronic steroids was admitted to the critical care service with acute respiratory failure, found to have an acute MI.  1. Non-STEMI; per cardiology  -LHC with hisg risk PCI planned for today. -Continue brilinta. No ASA given GI bleed.  -Became hypotensive after cath and required transfer to ICU on dopamine drip.  2. Acute on chronic combined CHF with ICM, pulmonary HTN PASP 29mm hg:  -LVEF 35-40% with moderately reduced systolic function and grade 1 DD with moderate hypokinesis of mid-distal inferior myocardium. Pericardial effusion.  -improving on diuresis; cont diuresis   -Is 11.1 L negative since admission.  3. Acute respiratory failure thought 2/2 to pulm edema from acute cardiac events: Initially intubated for three days.  -resolved, extubated; blood cultures NGTD;  inhalers, oxygen, cont to monitor.  4. Acute on chronic anemia (iron deficiency) with hx of 3 GIB in past.  - S/p 1 unit PRBC transfusion on 5/11; TF prn; no s/s of acute bleeding; needs OP colonoscopy   5. Acute on chronic renal failure, CKD 2--Cr trending down: 1.06 today from 1.5 on admission. -continue to trend; improved with diuresis   6. Hypertension: Initially hypotensive, now improved.  - cont current regimen   7. DM2--Last A1C 7.2 in Feb 2014. D/c metformin with CKD -SSI moderate and Lantus 10 qhs . -Fair control. -Continue current regimen.  8. UTI -Cx with E Coli. -Will transition to PO keflex for 5 days.  -Today is day #2/5.   Code Status: full Family Communication: Patient only. Disposition Plan:   Will sign off at this time. Please call us back if we can be assistance in the  future.  Consultants:  Pulmonology  Cardiology     HPI/Subjective: alert  Objective: Filed Vitals:   04/18/14 0817  BP: 148/61  Pulse: 63  Temp:   Resp:     Intake/Output Summary (Last 24 hours) at 04/18/14 0823 Last data filed at 04/18/14 0800  Gross per 24 hour  Intake 1195.83 ml  Output   1450 ml  Net -254.17 ml   Filed Weights   04/16/14 1430 04/17/14 0438 04/18/14 0444  Weight: 71.4 kg (157 lb 6.5 oz) 72.2 kg (159 lb 2.8 oz) 71.8 kg (158 lb 4.6 oz)    Exam:   General:  alert  Cardiovascular: s1,s2 rrr  Respiratory: decreased AE in R lung   Abdomen: soft, nt, nd   Musculoskeletal: no LE edema   Data Reviewed: Basic Metabolic Panel:  Recent Labs Lab 04/14/14 0425 04/15/14 0525 04/16/14 0505 04/16/14 1356 04/16/14 1657 04/17/14 0520  NA 139 142 141 136* 141 141  K 3.7 3.4* 4.0 4.5 5.0 4.2  CL 98 100 102 99 103 101  CO2 32 31 29  --  29 27  GLUCOSE 123* 109* 115* 229* 244* 227*  BUN 25* 24* 20 19 21 21   CREATININE 1.07 1.04 1.06 1.20* 1.13* 0.37*  CALCIUM 9.1 9.8 9.5  --  8.9 9.1   Liver Function Tests: No results found for this basename: AST, ALT, ALKPHOS, BILITOT, PROT, ALBUMIN,  in the last 168 hours No results found for this basename: LIPASE, AMYLASE,  in the last 168 hours No results found for this basename: AMMONIA,  in the last 168 hours CBC:  Recent Labs Lab 04/15/14 0525 04/16/14 1356 04/16/14 1500 04/16/14 1831 04/17/14 0520 04/17/14 2200  WBC 12.2*  --  13.5* 13.9* 14.0* 11.8*  HGB 10.6* 9.2* 8.2* 8.5* 7.6* 9.2*  HCT 35.8* 27.0* 27.3* 28.5* 25.7* 28.8*  MCV 78.9  --  79.1 79.4 78.8 80.0  PLT 214  --  240 250 225 157   Cardiac Enzymes: No results found for this basename: CKTOTAL, CKMB, CKMBINDEX, TROPONINI,  in the last 168 hours BNP (last 3 results)  Recent Labs  04/08/14 0423 04/08/14 1140 04/14/14 0425  PROBNP 13832.0* 21317.0* 8743.0*   CBG:  Recent Labs Lab 04/16/14 2022 04/17/14 0745 04/17/14 1146  04/17/14 1604 04/17/14 2118  GLUCAP 176* 167* 213* 253* 210*    Recent Results (from the past 240 hour(s))  URINE CULTURE     Status: None   Collection Time    04/15/14 12:08 PM      Result Value Ref Range Status   Specimen Description URINE, CLEAN CATCH   Final   Special Requests NONE   Final   Culture  Setup Time     Final   Value: 04/15/2014 17:18     Performed at [facility]     Final   Value: >=100,000 COLONIES/ML     Performed at [facility]   Culture     Final   Value: ESCHERICHIA COLI     Performed at [facility]   Report Status 04/17/2014 FINAL   Final   Organism ID, Bacteria ESCHERICHIA COLI   Final     Studies: No results found.  Scheduled Meds: . antiseptic oral rinse  15 mL Mouth Rinse BID  . atorvastatin  20 mg Oral q1800  . brimonidine  1 drop Right Eye TID   And  . brinzolamide  1 drop Right Eye TID  . budesonide  0.25 mg Nebulization BID  . carvedilol  3.125 mg Oral BID WC  . cephALEXin  500 mg Oral Q12H  . cycloSPORINE  1 drop Both Eyes BID  . insulin aspart  0-15 Units Subcutaneous TID AC & HS  . insulin glargine  10 Units Subcutaneous QHS  . ipratropium-albuterol  3 mL Nebulization BID  . latanoprost  1 drop Both Eyes QHS  . pantoprazole  40 mg Oral Q1200  . polyethylene glycol  17 g Oral BID  . predniSONE  20 mg Oral Q breakfast  . senna-docusate  2 tablet Oral BID  . sodium chloride  3 mL Intravenous Q12H  . ticagrelor  90 mg Oral BID   Continuous Infusions: . DOPamine Stopped (04/18/14 0000)    Principal Problem:   STEMI (ST elevation myocardial infarction) Active Problems:   DIABETES-TYPE 2   ANEMIA, IRON DEFICIENCY   HYPERTENSION   Psoriatic arthritis   Acute exacerbation of chronic obstructive pulmonary disease (COPD)   GI bleeding   Acute on chronic combined systolic and diastolic CHF (congestive heart failure)   Acute respiratory failure with hypoxia   Constipation   Pulmonary  hypertension   UTI (urinary tract infection)   Hypotension    Time spent: 25 minutes     [facility] Pager [phone]. If 7PM-7AM, please contact night-coverage at [email or web address], password [facility] 04/18/2014, 8:23 AM  LOS: 13 days

## 2014-04-18 NOTE — Progress Notes (Signed)
Left femoral venous sheath removed by cath lab staff.  Pressure held for 15 minutes.  Site soft without evidence of bleeding.  Pt tolerated procedure without incident.  Will continue to monitor pt closely.

## 2014-04-18 NOTE — Progress Notes (Signed)
CARDIAC REHAB PHASE I   PRE:  Rate/Rhythm: 73 SR    BP: sitting 126/56    SaO2: 96 RA  MODE:  Ambulation: 260 ft   POST:  Rate/Rhythm: 88 SR    BP: sitting 120/50     SaO2: 94 RA  Fairly steady with RW. C/o weakness and mostly SOB, esp when she stops to rest. Pt frustrated because she thought her SOB would go away after PCI. To recliner, VSS. ? PVCs while walking. Will f/u. [place], ACSM 04/18/2014 3:00 PM

## 2014-04-19 LAB — GLUCOSE, CAPILLARY
GLUCOSE-CAPILLARY: 77 mg/dL (ref 70–99)
GLUCOSE-CAPILLARY: 229 mg/dL — AB (ref 70–99)
Glucose-Capillary: 144 mg/dL — ABNORMAL HIGH (ref 70–99)
Glucose-Capillary: 259 mg/dL — ABNORMAL HIGH (ref 70–99)

## 2014-04-19 LAB — HEMOGLOBIN AND HEMATOCRIT, BLOOD
HCT: 29.9 % — ABNORMAL LOW (ref 36.0–46.0)
Hemoglobin: 9.4 g/dL — ABNORMAL LOW (ref 12.0–15.0)

## 2014-04-19 MED ORDER — NITROFURANTOIN MONOHYD MACRO 100 MG PO CAPS
100.0000 mg | ORAL_CAPSULE | Freq: Two times a day (BID) | ORAL | Status: DC
  Administered 2014-04-19 – 2014-04-21 (×4): 100 mg via ORAL
  Filled 2014-04-19 (×6): qty 1

## 2014-04-19 NOTE — Progress Notes (Addendum)
Patient ID: [name], female   DOB: 05-20-1941, 73 y.o.   MRN: [id number]    SUBJECTIVE:  73 y.o. female with a h/o hypertension, combined chronic CHF (echocardiogram 03/09/2014 EF 35% with diffuse hypokinesis and grade 1 diastolic dysfunction), COPD, diabetes, with A1c 7.2% on 01/03/2013, peripheral arterial disease, pulmonary hypertension, GI bleed, esophageal candidiasis and rheumatoid arthritis on chronic steroids was admitted on 04/05/2014 to the critical care service with acute respiratory failure requiring intubation for 4 days. Cardiology was consulted at that time due to ?STEMI with elevated troponins and EKG with ST segment elevations lead in V2, V3 and V4. These changes were present on her prior EKG of 03/17/2014 but are not more prominent.  04/09/2014 she underwent left heart catheterization, which revealed three-vessel disease involving proximal and mid LAD, severe distal left main, ostial circumflex, and immediate RCA. PCA was not feasible due to complex coronary anatomy. Surgical revascularization was recommended , but the patient was a poor surgical candidate. Medical therapy versus PCI was contemplated.   Subsequently, on 04/16/2014 she underwent Impella insertion, IVUS, rotational atherectomy of the LAD, PTCA and stenting of the proximal and mid-LAD, Preclose of the RFA by Dr. Burt Knack. Following her catheterization, patient developed low blood pressure after arrival on the floor with a systolic of 16S. She received 500 cc of IV normal saline and a dopamine drip was initiated.   Transferred to floor yesterday  Transfused two units  Keflex is hurting her stomach and ? diarrhea      . antiseptic oral rinse  15 mL Mouth Rinse BID  . atorvastatin  20 mg Oral q1800  . brimonidine  1 drop Right Eye TID   And  . brinzolamide  1 drop Right Eye TID  . budesonide  0.25 mg Nebulization BID  . carvedilol  3.125 mg Oral BID WC  . cephALEXin  500 mg Oral Q12H  . cycloSPORINE  1 drop  Both Eyes BID  . feeding supplement (RESOURCE BREEZE)  1 Container Oral BID BM  . insulin aspart  0-15 Units Subcutaneous TID AC & HS  . insulin glargine  10 Units Subcutaneous QHS  . ipratropium-albuterol  3 mL Nebulization BID  . latanoprost  1 drop Both Eyes QHS  . pantoprazole  40 mg Oral Q1200  . polyethylene glycol  17 g Oral BID  . predniSONE  20 mg Oral Q breakfast  . senna-docusate  2 tablet Oral BID  . sodium chloride  3 mL Intravenous Q12H  . ticagrelor  90 mg Oral BID   . DOPamine Stopped (04/18/14 0000)   PHYSICAL EXAM Filed Vitals:   04/18/14 1321 04/18/14 2024 04/19/14 0635 04/19/14 0922  BP: 115/57 141/58 136/58   Pulse: 72 58 65   Temp: 98.2 F (36.8 C) 98.2 F (36.8 C) 98.3 F (36.8 C)   TempSrc: Oral Oral Oral   Resp: 20 18 18    Height:      Weight:   165 lb 12.8 oz (75.206 kg)   SpO2: 96% 99% 93% 97%   Sitting in chair mild abdominal pain   HEENT: EOMI  Neck: JVP not appreciable  Cardiovascular: RRR  Lungs: b/l rales, mild expiratory wheeze  Abdomen: mild upper abdominal tenderness, distended with voluntary guarding, +bs.  Musculoskeletal: Moves all extremities, no edema, hyperpigmented, bilateral, lower extremities related to PVD. Left Midfoot amputation.  Neuro: alert, awake, and oriented Left groin and right wrist look fine   LABS: Lab Results  Component Value Date  TROPONINI >20.00* 04/05/2014   Results for orders placed during the hospital encounter of 04/05/14 (from the past 24 hour(s))  GLUCOSE, CAPILLARY     Status: Abnormal   Collection Time    04/18/14  4:50 PM      Result Value Ref Range   Glucose-Capillary 356 (*) 70 - 99 mg/dL  GLUCOSE, CAPILLARY     Status: Abnormal   Collection Time    04/18/14  8:26 PM      Result Value Ref Range   Glucose-Capillary 190 (*) 70 - 99 mg/dL  GLUCOSE, CAPILLARY     Status: None   Collection Time    04/19/14  7:34 AM      Result Value Ref Range   Glucose-Capillary 77  70 - 99 mg/dL  GLUCOSE,  CAPILLARY     Status: Abnormal   Collection Time    04/19/14 11:40 AM      Result Value Ref Range   Glucose-Capillary 144 (*) 70 - 99 mg/dL    Intake/Output Summary (Last 24 hours) at 04/19/14 1253 Last data filed at 04/19/14 0300  Gross per 24 hour  Intake    243 ml  Output    700 ml  Net   -457 ml    EKG:  High lateral T wave inversions, otherwise, normal EKG.  ASSESSMENT AND PLAN:  Principal Problem:   STEMI (ST elevation myocardial infarction) Active Problems:   DIABETES-TYPE 2   ANEMIA, IRON DEFICIENCY   HYPERTENSION   Psoriatic arthritis   Acute exacerbation of chronic obstructive pulmonary disease (COPD)   GI bleeding   Acute on chronic combined systolic and diastolic CHF (congestive heart failure)   Acute respiratory failure with hypoxia   Constipation   Pulmonary hypertension   UTI (urinary tract infection)   Hypotension    Acute blood loss Anemia: Chronic with component of acute loss with cath. Hg 8.5 >7.6 >9.2 after 2 units of RBC Plan  - goal >7.5 - on Brilinta. ASA - Plts are normal. No active bleeding last Hct 28.8 check in am   Non-STEMI; per cardiology. Status post high-risk PCI using hemodynamic support, and rotational  Atherectomy. Successful complex PCI of proximal and mid LAD using Impella.  Plan  - cont with Brilinta alone for 12 months then change to ASA 81 mg daily ( high risk for GI bleed prohibiting dual antiplatelet therapy)  Acute on chronic combined CHF with ICM, pulmonary HTN PASP 51mm hg:  -LVEF 35-40% with moderately reduced systolic function and grade 1 DD with moderate hypokinesis of mid-distal inferior myocardium. Pericardial effusion.  Was on 20 mg lasix at home consider restarting on d/c   Acute respiratory failure thought 2/2 to pulm edema from acute cardiac events: Initially intubated for three days.  -resolved, extubated; blood cultures NGTD; inhalers, oxygen, cont to monitor.   UTI:  Ecoli resistant to cipro  Keflex causing  GI distress Will ask IM to see if alternative antibiotic possible  Consider checking c diff if loose stools continue  Tentatively start macrobid 100 bid   D/C planning  Not clear if she will be ready for d/c home over weekend         Josue Hector 04/19/2014

## 2014-04-19 NOTE — Progress Notes (Signed)
CARDIAC REHAB PHASE I   PRE:  Rate/Rhythm: 60 SR  BP:  Supine:   Sitting: 110/60  Standing:    SaO2: 94 RA  MODE:  Ambulation: 350 ft   POST:  Rate/Rhythm: 77 SR  BP:  Supine: Sitting: 122/64  Standing:    SaO2: 94 RA Tolerated ambulation well with rolling walker and assistance x 1.  Education completed re: MI, activity restrictions, exercise progression, chest pain, NTG usage, when to call 911, when to call the MD.  Thinks she is feeling stronger than yesterday.  Encouraged to walk with nursing staff 2 more times today if not discharged. [phone]  Liliane Channel RN, BSN 04/19/2014 11:20 AM

## 2014-04-20 DIAGNOSIS — K5900 Constipation, unspecified: Secondary | ICD-10-CM

## 2014-04-20 DIAGNOSIS — I959 Hypotension, unspecified: Secondary | ICD-10-CM

## 2014-04-20 LAB — GLUCOSE, CAPILLARY
GLUCOSE-CAPILLARY: 137 mg/dL — AB (ref 70–99)
Glucose-Capillary: 227 mg/dL — ABNORMAL HIGH (ref 70–99)
Glucose-Capillary: 314 mg/dL — ABNORMAL HIGH (ref 70–99)
Glucose-Capillary: 175 mg/dL — ABNORMAL HIGH (ref 70–99)

## 2014-04-20 MED ORDER — FUROSEMIDE 20 MG PO TABS
20.0000 mg | ORAL_TABLET | Freq: Every day | ORAL | Status: DC
  Administered 2014-04-20 – 2014-04-21 (×2): 20 mg via ORAL
  Filled 2014-04-20 (×2): qty 1

## 2014-04-20 NOTE — Progress Notes (Signed)
Subjective:  Feeling better today. Only complaints are abd bloating which is not new for her. No CP. Walked with assistance yesterday  Objective:  Temp:  [98.4 F (36.9 C)] 98.4 F (36.9 C) (05/24 0536) Pulse Rate:  [62-67] 67 (05/24 0536) Resp:  [18] 18 (05/24 0536) BP: (130-150)/(26-55) 130/26 mmHg (05/24 0536) SpO2:  [95 %-98 %] 97 % (05/24 0940) FiO2 (%):  [95 %] 95 % (05/24 0536) Weight:  [142 lb 11.2 oz (64.728 kg)] 142 lb 11.2 oz (64.728 kg) (05/24 0536) Weight change: -23 lb 1.6 oz (-10.478 kg)  Intake/Output from previous day: 05/23 0701 - 05/24 0700 In: 3 [I.V.:3] Out: -   Intake/Output from this shift: Total I/O In: 243 [P.O.:240; I.V.:3] Out: 1 [Urine:1]  Physical Exam: General appearance: alert and no distress Neck: no adenopathy, no carotid bruit, no JVD, supple, symmetrical, trachea midline and thyroid not enlarged, symmetric, no tenderness/mass/nodules Lungs: clear to auscultation bilaterally Heart: soft outflow tract murmur Extremities: extremities normal, atraumatic, no cyanosis or edema  Lab Results: Results for orders placed during the hospital encounter of 04/05/14 (from the past 48 hour(s))  GLUCOSE, CAPILLARY     Status: Abnormal   Collection Time    04/18/14 12:27 PM      Result Value Ref Range   Glucose-Capillary 153 (*) 70 - 99 mg/dL  GLUCOSE, CAPILLARY     Status: Abnormal   Collection Time    04/18/14  4:50 PM      Result Value Ref Range   Glucose-Capillary 356 (*) 70 - 99 mg/dL  GLUCOSE, CAPILLARY     Status: Abnormal   Collection Time    04/18/14  8:26 PM      Result Value Ref Range   Glucose-Capillary 190 (*) 70 - 99 mg/dL  GLUCOSE, CAPILLARY     Status: None   Collection Time    04/19/14  7:34 AM      Result Value Ref Range   Glucose-Capillary 77  70 - 99 mg/dL  GLUCOSE, CAPILLARY     Status: Abnormal   Collection Time    04/19/14 11:40 AM      Result Value Ref Range   Glucose-Capillary 144 (*) 70 - 99 mg/dL    HEMOGLOBIN AND HEMATOCRIT, BLOOD     Status: Abnormal   Collection Time    04/19/14  1:55 PM      Result Value Ref Range   Hemoglobin 9.4 (*) 12.0 - 15.0 g/dL   HCT 29.9 (*) 36.0 - 46.0 %  GLUCOSE, CAPILLARY     Status: Abnormal   Collection Time    04/19/14  4:30 PM      Result Value Ref Range   Glucose-Capillary 259 (*) 70 - 99 mg/dL  GLUCOSE, CAPILLARY     Status: Abnormal   Collection Time    04/19/14  8:51 PM      Result Value Ref Range   Glucose-Capillary 229 (*) 70 - 99 mg/dL  GLUCOSE, CAPILLARY     Status: Abnormal   Collection Time    04/20/14  7:27 AM      Result Value Ref Range   Glucose-Capillary 137 (*) 70 - 99 mg/dL   Comment 1 Notify RN      Imaging: Imaging results have been reviewed  Tele: NSR  Assessment/Plan:   1. Principal Problem: 2.   STEMI (ST elevation myocardial infarction) 3. Active Problems: 4.   DIABETES-TYPE 2 5.   ANEMIA, IRON DEFICIENCY 6.  HYPERTENSION 7.   Psoriatic arthritis 8.   Acute exacerbation of chronic obstructive pulmonary disease (COPD) 9.   GI bleeding 10.   Acute on chronic combined systolic and diastolic CHF (congestive heart failure) 11.   Acute respiratory failure with hypoxia 12.   Constipation 13.   Pulmonary hypertension 14.   UTI (urinary tract infection) 15.   Hypotension 16.   Time Spent Directly with Patient:  30 minutes  Length of Stay:  LOS: 15 days   POD$ Impella Left groin , HSRA, PCI/Stent prox and mid LAD radially by Dr. Burt Knack. Transferred out of Unit 2 days ago. Tx 2 U PRBCs. HGB stable. Pt denies CP/SOB. Exam benign. VSS. NSR on tele. Labs OK. Agree with Dr. Johnsie Cancel regarding starting back on home lasix dose. Walked with assistance w/o difficulty.  Anticipate DC home tomorrow.  [name] 04/20/2014, 10:24 AM

## 2014-04-21 LAB — GLUCOSE, CAPILLARY
GLUCOSE-CAPILLARY: 139 mg/dL — AB (ref 70–99)
Glucose-Capillary: 251 mg/dL — ABNORMAL HIGH (ref 70–99)

## 2014-04-21 MED ORDER — TICAGRELOR 90 MG PO TABS: 90.0000 mg | ORAL_TABLET | Freq: Two times a day (BID) | ORAL | Status: DC

## 2014-04-21 MED ORDER — CARVEDILOL 3.125 MG PO TABS: 3.1250 mg | ORAL_TABLET | Freq: Two times a day (BID) | ORAL | Status: DC

## 2014-04-21 MED ORDER — NITROFURANTOIN MONOHYD MACRO 100 MG PO CAPS: 100.0000 mg | ORAL_CAPSULE | Freq: Two times a day (BID) | ORAL | Status: DC

## 2014-04-21 NOTE — Discharge Instructions (Signed)
° °  Post cardiac catheterization instructions No driving for 1 week. No lifting over 5 lbs for 1 week. No sexual activity for 1 week. Keep procedure sites clean & dry. If you notice increased pain, swelling, bleeding or pus, call/return!  You may shower, but no soaking baths/hot tubs/pools for 1 week.

## 2014-04-21 NOTE — Progress Notes (Signed)
Subjective:  No Complaints today. Denies CP/SOB  Objective:  Temp:  [98.2 F (36.8 C)-98.4 F (36.9 C)] 98.2 F (36.8 C) (05/25 0514) Pulse Rate:  [60-64] 60 (05/25 0514) Resp:  [17-19] 17 (05/25 0514) BP: (125-148)/(49-62) 148/54 mmHg (05/25 0514) SpO2:  [97 %-98 %] 97 % (05/25 0514) Weight:  [159 lb 2.8 oz (72.2 kg)] 159 lb 2.8 oz (72.2 kg) (05/25 0514) Weight change: 16 lb 7.6 oz (7.472 kg)  Intake/Output from previous day: 05/24 0701 - 05/25 0700 In: 963 [P.O.:960; I.V.:3] Out: 1 [Urine:1]  Intake/Output from this shift:    Physical Exam: General appearance: alert and no distress Neck: no adenopathy, no carotid bruit, no JVD, supple, symmetrical, trachea midline and thyroid not enlarged, symmetric, no tenderness/mass/nodules Lungs: clear to auscultation bilaterally Heart: regular rate and rhythm, S1, S2 normal, no murmur, click, rub or gallop Extremities: extremities normal, atraumatic, no cyanosis or edema  Lab Results: Results for orders placed during the hospital encounter of 04/05/14 (from the past 48 hour(s))  GLUCOSE, CAPILLARY     Status: Abnormal   Collection Time    04/19/14 11:40 AM      Result Value Ref Range   Glucose-Capillary 144 (*) 70 - 99 mg/dL  HEMOGLOBIN AND HEMATOCRIT, BLOOD     Status: Abnormal   Collection Time    04/19/14  1:55 PM      Result Value Ref Range   Hemoglobin 9.4 (*) 12.0 - 15.0 g/dL   HCT 29.9 (*) 36.0 - 46.0 %  GLUCOSE, CAPILLARY     Status: Abnormal   Collection Time    04/19/14  4:30 PM      Result Value Ref Range   Glucose-Capillary 259 (*) 70 - 99 mg/dL  GLUCOSE, CAPILLARY     Status: Abnormal   Collection Time    04/19/14  8:51 PM      Result Value Ref Range   Glucose-Capillary 229 (*) 70 - 99 mg/dL  GLUCOSE, CAPILLARY     Status: Abnormal   Collection Time    04/20/14  7:27 AM      Result Value Ref Range   Glucose-Capillary 137 (*) 70 - 99 mg/dL   Comment 1 Notify RN    GLUCOSE, CAPILLARY     Status:  Abnormal   Collection Time    04/20/14 11:27 AM      Result Value Ref Range   Glucose-Capillary 227 (*) 70 - 99 mg/dL   Comment 1 Notify RN    GLUCOSE, CAPILLARY     Status: Abnormal   Collection Time    04/20/14  4:25 PM      Result Value Ref Range   Glucose-Capillary 314 (*) 70 - 99 mg/dL   Comment 1 Notify RN    GLUCOSE, CAPILLARY     Status: Abnormal   Collection Time    04/20/14  9:40 PM      Result Value Ref Range   Glucose-Capillary 175 (*) 70 - 99 mg/dL   Comment 1 Documented in Chart     Comment 2 Notify RN    GLUCOSE, CAPILLARY     Status: Abnormal   Collection Time    04/21/14  7:34 AM      Result Value Ref Range   Glucose-Capillary 139 (*) 70 - 99 mg/dL   Comment 1 Documented in Chart     Comment 2 Notify RN      Imaging: Imaging results have been reviewed  Tele: NSR  Assessment/Plan:   1. Principal Problem: 2.   STEMI (ST elevation myocardial infarction) 3. Active Problems: 4.   DIABETES-TYPE 2 5.   ANEMIA, IRON DEFICIENCY 6.   HYPERTENSION 7.   Psoriatic arthritis 8.   Acute exacerbation of chronic obstructive pulmonary disease (COPD) 9.   GI bleeding 10.   Acute on chronic combined systolic and diastolic CHF (congestive heart failure) 11.   Acute respiratory failure with hypoxia 12.   Constipation 13.   Pulmonary hypertension 14.   UTI (urinary tract infection) 15.   Hypotension 16.   Time Spent Directly with Patient:  20 minutes  Length of Stay:  LOS: 16 days   OK for D/C home today. On DAPT. Exam benign. VSS. Labs OK. ROV with MLP 1-2 weeks, with me 4-6 weeks  Lorretta Harp 04/21/2014, 8:04 AM

## 2014-04-21 NOTE — Progress Notes (Signed)
Pt discharged home .  AVS ,discharged instruction, medication script and follow up instruction given to pt and pt demonstrated good understanding with teach back.  Condition at discharge is stable. Left unit via w/c accompanied by sister. Carin Hock RN.

## 2014-04-21 NOTE — Progress Notes (Signed)
CARE MANAGEMENT NOTE 04/21/2014  Patient:  [name], [name]   Account Number:  [id number]  Date Initiated:  04/06/2014  Documentation initiated by:  [facility]  Subjective/Objective Assessment:   Admitted with resp failure - intubated.     Action/Plan:   Anticipated DC Date:  04/19/2014   Anticipated DC Plan:  [name]  CM consult  Medication Assistance      [name] Choice  Resumption Of Svcs/PTA Provider   Choice offered to / List presented to:          [name] arranged  [name]  [place].   Status of service:  Completed, signed off Medicare Important Message given?  YES (If response is "NO", the following Medicare IM given date fields will be blank) Date Medicare IM given:  04/09/2014 Date Additional Medicare IM given:  04/18/2014  Discharge Disposition:  [name]  Per UR Regulation:  Reviewed for med. necessity/level of care/duration of stay  If discussed at [place] of Stay Meetings, dates discussed:   04/10/2014  04/18/2014    Comments:  04/21/2014 1135 NCM spoke to pt and activated 30 day free Brilinta Card. Additional Medicare IM given, placed on chart. [name] RN CCM Case Mgmt phone [phone]  Contact:  [name] [phone]   [phone]                 [name] [phone]                 [facility] Brother [phone]   5/22 1035a debbie dowell rn,bsn pt for poss dc in am. act w [facility]. gave pt 30day free brilinta card. will alert [facility] of poss dc in am. pt has 45.00 per month copay w no prior auth req.  04/15/14 Ellan Lambert, RN, BSN [phone] PCI planned for tomorrow.  Pt with increased SOB today; would benefit from PT c/s to determine hoome needs.  MD, please order when appropriate/pt able to tolerate.

## 2014-04-21 NOTE — Discharge Summary (Signed)
CARDIOLOGY DISCHARGE SUMMARY   Patient ID: [name],  MRN: [id number], DOB/AGE: 1940-12-26 73 y.o.  Admit date: 04/05/2014 Discharge date: 04/21/2014  Primary Care Physician: Vincente Liberty, MD Primary Cardiologist: Quay Burow, MD  Primary Discharge Diagnoses:  1. Acute respiratory failure requiring intubation 2. Acute COPD exacerbation 3. CAD s/p STEMI and complex, high risk PCI to proximal-mid LAD with hemodynamic support using Impella CP and adjunctive rotational atherectomy (the left main was not treated as the minimal left main area was adequate)  4. Ischemic CM, EF 35% 5. Pericardial effusion, small to moderate without tamponade (also noted on echo from April 2015) 6. GI bleed 7. Acute blood loss anemia in setting of chronic iron deficiency anemia 8. Acute on chronic systolic HF 9. Pulmonary hypertension 10. E.coli UTI  Secondary Discharge Diagnoses:  1. COPD 2. Hypertension 3. Peripheral arterial disease 4. Chronic iron deficiency anemia 5. Diabetes mellitus 6. Psoriasis with psoriatic arthritis and chronic steroid use 7. History of candida esophagitis 8. Gout 9. GERD  Procedures This Admission:   Blood culture x 2 04/05/2014  Negative  Respiratory culture 04/07/2014 Negative  Urine culture 04/15/2014 Positive for E.coli  2D echocardiogram 04/06/2014 Study Conclusions - Left ventricle: The cavity size was mildly dilated. Wall thickness was normal. Systolic function was moderately reduced. The estimated ejection fraction was in the range of 35% to 40%. There is moderate hypokinesis of the mid-distalinferior myocardium. Doppler parameters are consistent with abnormal left ventricular relaxation (grade 1 diastolic dysfunction). - Mitral valve: Mild regurgitation. - Left atrium: The atrium was moderately dilated. - Right ventricle: The cavity size was mildly dilated. - Right atrium: The atrium was moderately dilated. - Atrial septum: No  defect or patent foramen ovale was identified. - Tricuspid valve: Moderate regurgitation. - Pulmonary arteries: PA peak pressure: 71mm Hg (S). - Pericardium, extracardiac: A small to moderate, free-flowing pericardial effusion was identified. The fluid had no internal echoes.There was no evidence of hemodynamic compromise. Impression: - Since previous study of March 17, 2014, the pericardial effusion has increased.  Ultrasound of abdomen 04/08/2014 FINDINGS: Gallbladder: No gallstones or wall thickening visualized. No sonographic Murphy sign noted. Common bile duct: Diameter: Within normal limits at 3.7 mm Liver: No focal lesion identified. Within normal limits in parenchymal echogenicity. Main portal vein is patent with normal hepatopetal flow. IVC: No abnormality visualized. Pancreas: Visualized portion unremarkable. The pancreatic tail is obscured by overlying bowel gas. Spleen: Size and appearance within normal limits. Right Kidney: Length: 11.5 cm. Echogenicity within normal limits. No mass or hydronephrosis visualized. Left Kidney: Length: 12 cm. Echogenicity within normal limits. No solid mass or hydronephrosis visualized. Small predominantly anechoic simple cysts. Abdominal aorta: No aneurysm visualized. Other findings: Hypoechoic 2.0 x 1.7 x 1.6 cm soft tissue nodule in the right adrenal space consistent with patient's known adenoma. IMPRESSION: 1. No acute abnormality in the abdomen. 2. Right adrenal nodules stable dating back to April of 2010 and therefore almost certainly a benign adenoma. 3. Simple to minimally complex left renal cysts.  Left heart catheterization 04/09/2014 HEMODYNAMICS: Aortic pressure 160/78 mmHg; LV pressure 162/21 mmHg; LVEDP 27 mm mercury  ANGIOGRAPHIC DATA: The left main coronary artery is heavily calcified. There is 50-60% distal left main with ulcerated plaque.  The left anterior descending artery is heavily calcified with  proximal eccentric 95% stenosis and mid high-grade, segmental diffuse 80% disease overlapping the origin of the first diagonal.  The left circumflex artery is narrowed to 60% at the ostium. The first obtuse  marginal is a branching large vessel with the more inferior limb have an ostial 90% stenosis..  The right coronary artery is dominant. There is moderate to heavy calcification. The proximal to mid vessel contains a focal eccentric 60-85% stenosis depending upon the view.  LEFT VENTRICULOGRAM: Left ventricular and diastolic pressures are elevated. EF is known to be 35% by noninvasive imaging.  IMPRESSIONS: 1. Severe calcific 3 vessel coronary artery disease, involving the proximal and mid LAD most severely, the distal left main, the ostial circumflex, and the mid right coronary  2. Elevated left ventricular end-diastolic pressure and known systolic dysfunction  3. Presentation with ACS in acute systolic heart failure  RECOMMENDATION: The patient has critical anatomy that would be most suited for surgical intervention. Her comorbidities make the possibility of surgery less likely. Will have the patient evaluated by the TCTS team.  Impella insertion, IVUS, rotational atherectomy of the LAD, PTCA and stenting of the proximal and mid-LAD, Preclose of the RFA 04/16/2014 Lesion Data:  Lesion 1:  Vessel: Proximal LAD  Percent stenosis (pre): 95 (heavily calcified)  TIMI-flow (pre): 3  Stent: 3.5x28 mm Xience DES  Percent stenosis (post): 0  TIMI-flow (post): 3  Lesion 2:  Vessel: Mid-LAD  Percent stenosis (pre): 90  TIMI-flow (pre): 3  Stent: 3.0x33 Xience DES  Percent stenosis (post): 0  TIMI-flow (post): 3  Conclusions: Successful complex PCI of the proximal and mid-LAD with hemodynamic support using Impella CP and adjunctive rotational atherectomy. The left main was not treated as the minimal left main area was adequate.  Recommendations: Brilinta alone as tolerated for 12 months, then change  to ASA 81 mg daily.  Note: case was complex/difficult because of complex anatomy with severe diffuse calcification and tortuosity of the LAD, need for extensive lesion prep with rotational atherectomy and multiple balloon inflations.   History and Hospital Course:  Ms. Hommes is a 73 year old woman with hypertension, combined chronic HF (echocardiogram 03/09/2014 EF 35% with diffuse hypokinesis and grade 1 diastolic dysfunction), COPD, CKD, diabetes mellitus, peripheral arterial disease, pulmonary hypertension, GI bleed, esophageal candidiasis and rheumatoid arthritis on chronic steroids was admitted on 04/05/2014 to the critical care service with acute respiratory failure requiring intubation. Cardiology was consulted on admission due to ? STEMI with elevated troponins and ECG with ST segment elevation leads V2, V3 and V4. These changes were present on her prior ECG from 03/17/2014 but were more prominent. Peak troponin >20. Chest x-ray also with pulmonary edema requiring IV diuresis. Echo done revealing LVEF 35-40%, hypokinesis of the mid-distalinferior myocardium and PASP 60. She was extubated on 04/08/2014. On 04/09/2014 she underwent left heart catheterization which revealed three-vessel disease involving proximal and mid LAD, severe distal left main, ostial circumflex, and immediate RCA. PCI was not feasible due to complex coronary anatomy. Surgical revascularization was recommended but the patient was deemed a poor surgical candidate by TCTS. Medical therapy versus PCI was contemplated. Ultimately, on 04/16/2014, she underwent Impella insertion, IVUS, rotational atherectomy of the LAD, PTCA and stenting of the proximal and mid-LAD, Perclose of the RFA by Dr. Burt Knack. Following her catheterization, she developed hypotension with SBP in 70s. She received IV normal saline fluid bolus and a dopamine drip was initiated. She was transferred to cardiac ICU. No signs of CHF. She did have evidence of acute blood  loss anemia. Chronic anemia with component of acute loss post cath. Hg 8.5 >7.6 >9.2 after 2 units pRBCs. Platelets were normal. Overnight her BP improved and she was  weaned off dopamine and transferred back to the floor. Of note, she was started on Brillinta only as she has h/o GI bleeding and positive FOBT. The plan is is to start Brilinta alone as tolerated for 12 months, then change to ASA 81 mg daily. She was continued on medical therapy. Lasix was transitioned to PO and her volume status remained stable. She was mobilized with cardiac rehab. She remained hemodynamically stable. She has been seen, examined and deemed stable for discharge home today by Dr. Quay Burow. She will follow-up in the office with NP/PA in 1 week and see Dr. Gwenlyn Found in 4-6 weeks.  Discharge Vitals: Blood pressure 148/54, pulse 60, temperature 98.2 F (36.8 C), temperature source Oral, resp. rate 17, height 5\' 4"  (1.626 m), weight 159 lb 2.8 oz (72.2 kg), SpO2 98.00%.   Labs: Lab Results  Component Value Date   WBC 11.8* 04/17/2014   HGB 9.4* 04/19/2014   HCT 29.9* 04/19/2014   MCV 80.0 04/17/2014   PLT 157 04/17/2014     Recent Labs Lab 04/17/14 0520  NA 141  K 4.2  CL 101  CO2 27  BUN 21  CREATININE 0.37*  CALCIUM 9.1  GLUCOSE 227*   Lab Results  Component Value Date   CKTOTAL 609* 10/08/2011   CKMB 16.4* 10/08/2011   TROPONINI >20.00* 04/05/2014    Lab Results  Component Value Date   CHOL 153 04/06/2014   Lab Results  Component Value Date   HDL 62 04/06/2014   Lab Results  Component Value Date   LDLCALC 81 04/06/2014   Lab Results  Component Value Date   TRIG 50 04/06/2014   Lab Results  Component Value Date   CHOLHDL 2.5 04/06/2014    Disposition:  The patient is being discharged in stable condition.  Follow-up:     Follow-up Information   Follow up with [facility], NP On 04/28/2014. (At 10:30 AM for hospital follow-up (Dr. Kennon Holter NP))    Specialty:  Cardiology   Contact  information:   [address] [place] [address] [phone]       Follow up with Lorretta Harp, MD On 06/10/2014. (At 10:00 AM)    Specialty:  Cardiology   Contact information:   [address] [place] [address] [phone]       Follow up with Leola Brazil, MD. Schedule an appointment as soon as possible for a visit in 1 week. (For hospital follow-up)    Specialty:  Pulmonary Disease   Contact information:   [place] [address] [phone]      Discharge Medications:    Medication List    STOP taking these medications       ranitidine 150 MG capsule  Commonly known as:  ZANTAC      TAKE these medications       albuterol 108 (90 BASE) MCG/ACT inhaler  Commonly known as:  PROVENTIL HFA;VENTOLIN HFA  Inhale 1 puff into the lungs every 6 (six) hours as needed. FOR SHORTNESS OF BREATH     ALPRAZolam 0.5 MG tablet  Commonly known as:  XANAX  Take 1 tablet (0.5 mg total) by mouth 2 (two) times daily as needed for anxiety or sleep.     ammonium lactate 12 % lotion  Commonly known as:  LAC-HYDRIN  Apply 1 application topically as needed for dry skin (psorias).     carvedilol 3.125 MG tablet  Commonly known as:  COREG  Take 1 tablet (3.125 mg total) by mouth 2 (two) times daily with a meal.     Clobetasol Propionate 0.05 % external spray  Commonly known as:  TEMOVATE  Apply 1 spray topically 2 (two) times daily as needed. For psoriasis flare-ups     [employer] ARTHRITIS PAIN RELIEVER EX  Apply 1 application topically daily as needed (for shoulder pains).     esomeprazole 40 MG capsule  Commonly known as:  NEXIUM  Take 40 mg by mouth daily before breakfast.     feeding supplement (GLUCERNA SHAKE) Liqd  Take 237 mLs by mouth daily.     ferrous sulfate 325 (65 FE) MG tablet  Take 1 tablet (325 mg total) by mouth 2 (two) times daily with a meal.     fluticasone 50 MCG/ACT nasal spray  Commonly  known as:  FLONASE  Place 2 sprays into both nostrils daily as needed for allergies.     furosemide 40 MG tablet  Commonly known as:  LASIX  Take 20 mg by mouth daily.     ICAPS Caps  Take 1 capsule by mouth daily.     ipratropium-albuterol 0.5-2.5 (3) MG/3ML Soln  Commonly known as:  DUONEB  Take 3 mLs by nebulization every 4 (four) hours as needed (shortness of breath).     metFORMIN 500 MG tablet  Commonly known as:  GLUCOPHAGE  Take 500 mg by mouth 2 (two) times daily with a meal.     naproxen sodium 220 MG tablet  Commonly known as:  ANAPROX  Take 220 mg by mouth daily.     nitrofurantoin (macrocrystal-monohydrate) 100 MG capsule  Commonly known as:  MACROBID  Take 1 capsule (100 mg total) by mouth every 12 (twelve) hours.     predniSONE 5 MG tablet  Commonly known as:  DELTASONE  Take 5 mg by mouth daily. On Monday, Wednesday, Friday     RESTASIS 0.05 % ophthalmic emulsion  Generic drug:  cycloSPORINE  Place 1 drop into both eyes 2 (two) times daily.     SIMBRINZA 1-0.2 % Susp  Generic drug:  Brinzolamide-Brimonidine  Place 1 drop into the right eye 3 (three) times daily.     simethicone 80 MG chewable tablet  Commonly known as:  MYLICON  Chew 1 tablet (80 mg total) by mouth 4 (four) times daily as needed for flatulence.     budesonide-formoterol 160-4.5 MCG/ACT inhaler  Commonly known as:  SYMBICORT  Inhale 2 puffs into the lungs every evening.     SYMBICORT 160-4.5 MCG/ACT inhaler  Generic drug:  budesonide-formoterol  Inhale 2 puffs into the lungs 2 (two) times daily.     ticagrelor 90 MG Tabs tablet  Commonly known as:  BRILINTA  Take 1 tablet (90 mg total) by mouth 2 (two) times daily.     tiotropium 18 MCG inhalation capsule  Commonly known as:  SPIRIVA  Place 1 capsule (18 mcg total) into inhaler and inhale daily.     Travoprost (BAK Free) 0.004 % Soln ophthalmic solution  Commonly known as:  TRAVATAN  Place 1 drop into both eyes at bedtime.        Duration of Discharge Encounter: Greater than 30 minutes including physician time.  Darrick Huntsman, PA-C 04/21/2014, 10:49 AM

## 2014-04-28 ENCOUNTER — Ambulatory Visit (INDEPENDENT_AMBULATORY_CARE_PROVIDER_SITE_OTHER): Payer: Medicare Other | Admitting: Cardiology

## 2014-04-28 ENCOUNTER — Encounter: Payer: Self-pay | Admitting: Cardiology

## 2014-04-28 VITALS — BP 126/62 | HR 77 | Ht 64.0 in | Wt 159.6 lb

## 2014-04-28 DIAGNOSIS — I2589 Other forms of chronic ischemic heart disease: Secondary | ICD-10-CM

## 2014-04-28 DIAGNOSIS — E785 Hyperlipidemia, unspecified: Secondary | ICD-10-CM

## 2014-04-28 DIAGNOSIS — I255 Ischemic cardiomyopathy: Secondary | ICD-10-CM

## 2014-04-28 DIAGNOSIS — I213 ST elevation (STEMI) myocardial infarction of unspecified site: Secondary | ICD-10-CM

## 2014-04-28 DIAGNOSIS — I10 Essential (primary) hypertension: Secondary | ICD-10-CM

## 2014-04-28 DIAGNOSIS — I4949 Other premature depolarization: Secondary | ICD-10-CM

## 2014-04-28 DIAGNOSIS — I493 Ventricular premature depolarization: Secondary | ICD-10-CM

## 2014-04-28 DIAGNOSIS — K922 Gastrointestinal hemorrhage, unspecified: Secondary | ICD-10-CM

## 2014-04-28 DIAGNOSIS — D509 Iron deficiency anemia, unspecified: Secondary | ICD-10-CM

## 2014-04-28 DIAGNOSIS — I2510 Atherosclerotic heart disease of native coronary artery without angina pectoris: Secondary | ICD-10-CM

## 2014-04-28 DIAGNOSIS — D500 Iron deficiency anemia secondary to blood loss (chronic): Secondary | ICD-10-CM

## 2014-04-28 DIAGNOSIS — I219 Acute myocardial infarction, unspecified: Secondary | ICD-10-CM

## 2014-04-28 LAB — CBC
HEMATOCRIT: 27.4 % — AB (ref 36.0–46.0)
Hemoglobin: 8.8 g/dL — ABNORMAL LOW (ref 12.0–15.0)
MCH: 26 pg (ref 26.0–34.0)
MCHC: 32.1 g/dL (ref 30.0–36.0)
MCV: 80.8 fL (ref 78.0–100.0)
Platelets: 247 10*3/uL (ref 150–400)
RBC: 3.39 MIL/uL — ABNORMAL LOW (ref 3.87–5.11)
RDW: 22.9 % — AB (ref 11.5–15.5)
WBC: 12.1 10*3/uL — AB (ref 4.0–10.5)

## 2014-04-28 LAB — COMPLETE METABOLIC PANEL WITH GFR
ALBUMIN: 3.7 g/dL (ref 3.5–5.2)
ALT: 17 U/L (ref 0–35)
AST: 21 U/L (ref 0–37)
Alkaline Phosphatase: 75 U/L (ref 39–117)
BILIRUBIN TOTAL: 0.5 mg/dL (ref 0.2–1.2)
BUN: 20 mg/dL (ref 6–23)
CO2: 29 meq/L (ref 19–32)
Calcium: 9.7 mg/dL (ref 8.4–10.5)
Chloride: 99 mEq/L (ref 96–112)
Creat: 1.1 mg/dL (ref 0.50–1.10)
GFR, EST AFRICAN AMERICAN: 57 mL/min — AB
GFR, EST NON AFRICAN AMERICAN: 50 mL/min — AB
GLUCOSE: 236 mg/dL — AB (ref 70–99)
Potassium: 4.3 mEq/L (ref 3.5–5.3)
Sodium: 136 mEq/L (ref 135–145)
Total Protein: 6.3 g/dL (ref 6.0–8.3)

## 2014-04-28 LAB — MAGNESIUM: MAGNESIUM: 2.3 mg/dL (ref 1.5–2.5)

## 2014-04-28 NOTE — Patient Instructions (Addendum)
Your physician recommends that you schedule a follow-up appointment in 2 weeks.  Using the hemoccult containers given down stairs - record first bowel movement without iron. Then please start taking the iron daily.

## 2014-04-28 NOTE — Progress Notes (Signed)
05/02/2014   PCP: Leola Brazil, MD   Chief Complaint  Patient presents with  . Follow-up    post hospital, Resp. failure    Primary Cardiologist:Dr. Adora Fridge   HPI:  73 year-old female with a history of hypertension, combined chronic heart failure with EF 35% and grade 1 diastolic dysfunction, COPD, CAD, diabetes, perform atrial disease, pulmonary hypertension, GI bleed. 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 Recently hospitalized 04/05/2014 secondary to acute respiratory failure requiring intubation.    During the hospitalization her peak troponin was greater than 20 she had pulmonary edema requiring IV diuresing. Echo was done EF is 35-40% she did undergo left heart cath 04/09/2014 with three-vessel disease involving proximal and mid LAD, severe distal left main, ostial circumflex and RCA. PCI was not feasible due to coronary anatomy. T. CTS evaluated for bypass grafting but she was found to be a poor surgical candidate. PCI was felt to be the safest intervention at that point. 04/16/2014 she underwent Impala insertion IVUS, rotational atherectomy of the LAD, angioplasty and stenting of the proximal and mid LAD with Xience Alpine drug-eluting stent. Postop was complicated by hypotension, cardiogenic shock requiring dopamine drip. She also had acute blood loss anemia requiring 2 units packed RBCs.  The plan is to use Brilinta alone for her new stent for 12 months and then change to aspirin alone daily. She was mobilized by cardiac rehabilitation, continued to to improve and was finally discharged home 04/21/2014.  She is back today for followup.  She complains of some dark stools but has not yet started her iron.  She is Brilinta.  She denies chest pain no shortness of breath her weight is stable at 159 pounds which was her discharge weight. She does feel a little weak today.  The plan is for colonoscopy in the future. She no longer smokes  he stopped tobacco in November. Other history includes Barrett's esophagus.  Lipid Panel     Component Value Date/Time   CHOL 153 04/06/2014 0240   TRIG 50 04/06/2014 0240   HDL 62 04/06/2014 0240   CHOLHDL 2.5 04/06/2014 0240   VLDL 10 04/06/2014 0240   LDLCALC 81 04/06/2014 0240     Allergies  Allergen Reactions  . Amlodipine Besy-Benazepril Hcl Other (See Comments)    Nervous/shakiness  . Statins Other (See Comments)    "can't take any of them; cramp me up" (12/06/2012)  . Tradjenta [Linagliptin] Other (See Comments)    Extreme muscle pains, chest and back pains  . Plavix [Clopidogrel] Rash    Severe burning of the skin.Pt states that the doctor told her that her rash was psoriasis and not related to plavix but she states the rash came from the plavix  . Raptiva [Efalizumab] Rash    Current Outpatient Prescriptions  Medication Sig Dispense Refill  . albuterol (PROVENTIL HFA;VENTOLIN HFA) 108 (90 BASE) MCG/ACT inhaler Inhale 1 puff into the lungs every 6 (six) hours as needed. FOR SHORTNESS OF BREATH      . ALPRAZolam (XANAX) 0.5 MG tablet Take 1 tablet (0.5 mg total) by mouth 2 (two) times daily as needed for anxiety or sleep.  45 tablet  0  . ammonium lactate (LAC-HYDRIN) 12 % lotion Apply 1 application topically as needed for dry skin (psorias).      Marland Kitchen aspirin 81 MG tablet Take 81 mg by mouth daily.      . Brinzolamide-Brimonidine (  SIMBRINZA) 1-0.2 % SUSP Place 1 drop into the right eye 3 (three) times daily.       . budesonide-formoterol (SYMBICORT) 160-4.5 MCG/ACT inhaler Inhale 2 puffs into the lungs every evening.      . carvedilol (COREG) 3.125 MG tablet Take 1 tablet (3.125 mg total) by mouth 2 (two) times daily with a meal.  60 tablet  6  . Clobetasol Propionate (TEMOVATE) 0.05 % external spray Apply 1 spray topically 2 (two) times daily as needed. For psoriasis flare-ups      . esomeprazole (NEXIUM) 40 MG capsule Take 40 mg by mouth daily before breakfast.      . feeding  supplement, GLUCERNA SHAKE, (GLUCERNA SHAKE) LIQD Take 237 mLs by mouth daily.       . fluticasone (FLONASE) 50 MCG/ACT nasal spray Place 2 sprays into both nostrils daily as needed for allergies.       . furosemide (LASIX) 40 MG tablet Take 20 mg by mouth daily.       Marland Kitchen ipratropium-albuterol (DUONEB) 0.5-2.5 (3) MG/3ML SOLN Take 3 mLs by nebulization every 4 (four) hours as needed (shortness of breath).      . Liniments ([employer] ARTHRITIS PAIN RELIEVER EX) Apply 1 application topically daily as needed (for shoulder pains).      . metFORMIN (GLUCOPHAGE) 500 MG tablet Take 500 mg by mouth 2 (two) times daily with a meal.      . Multiple Vitamins-Minerals (ICAPS) CAPS Take 1 capsule by mouth daily.      . naproxen sodium (ANAPROX) 220 MG tablet Take 220 mg by mouth as needed.       . nitrofurantoin, macrocrystal-monohydrate, (MACROBID) 100 MG capsule Take 1 capsule (100 mg total) by mouth every 12 (twelve) hours.  60 capsule  0  . predniSONE (DELTASONE) 5 MG tablet Take 5 mg by mouth every Monday, Wednesday, and Friday.       . ranitidine (ZANTAC) 150 MG capsule Take 1 capsule by mouth at bedtime.      . RESTASIS 0.05 % ophthalmic emulsion Place 1 drop into both eyes 2 (two) times daily.       . simethicone (MYLICON) 80 MG chewable tablet Chew 1 tablet (80 mg total) by mouth 4 (four) times daily as needed for flatulence.  30 tablet  0  . ticagrelor (BRILINTA) 90 MG TABS tablet Take 1 tablet (90 mg total) by mouth 2 (two) times daily.  60 tablet  0  . tiotropium (SPIRIVA) 18 MCG inhalation capsule Place 1 capsule (18 mcg total) into inhaler and inhale daily.  30 capsule  12  . Travoprost, BAK Free, (TRAVATAMN) 0.004 % SOLN ophthalmic solution Place 1 drop into both eyes at bedtime.       . ferrous sulfate 325 (65 FE) MG tablet Take 1 tablet (325 mg total) by mouth 2 (two) times daily with a meal.  60 tablet  2   No current facility-administered medications for this visit.    Past Medical History    Diagnosis Date  . COPD (chronic obstructive pulmonary disease)   . Hypertension   . Asthma   . Psoriatic arthritis   . Psoriasis   . GERD (gastroesophageal reflux disease)   . Hiatal hernia   . Chronic sinus infection   . Heart murmur   . Blood transfusion ~ 1962    "11 units of blood"  . Ankylosing spondylitis   . Chronic back pain   . Dysrhythmia     "palpitations"  .  Esophageal dilatation   . Candida esophagitis   . Cirrhosis of liver without mention of alcohol   . Hypercholesteremia   . Peripheral vascular disease   . Exertional dyspnea   . Pneumonia 10/07/11; 2006; 1980's  . Chronic bronchitis     "since childhood" (12/06/2012)  . Type II diabetes mellitus   . Iron deficiency anemia   . External bleeding hemorrhoids   . Sinus headache   . Arthritis     "psorasic"  . OA (osteoarthritis)   . DJD (degenerative joint disease) of knee     "both" (12/06/2012)  . Carpal tunnel syndrome on right   . Anxiety   . Barrett esophagus   . Non-healing ulcer of foot, secondary to diabetes and PVD 12/07/2012  . PVD (peripheral vascular disease) 12/07/2012  . Tobacco abuse 12/07/2012  . Critical lower limb ischemia, with ulcer Lt foot 12/07/2012  . Gout   . COPD (chronic obstructive pulmonary disease)   . Carotid artery disease     dopplers showed mod L ICA stenosis   . PAD (peripheral artery disease)   . History of nuclear stress test 11/2012    lexiscan; normal, low risk   . Dermatophytosis of nail [id number]  . Psoriasis   . GI bleed   . Diverticulosis     Past Surgical History  Procedure Laterality Date  . Nasal sinus surgery  09/29/2009  . Carpal tunnel release  2011    left hand  . Lipoma excision  2010    "left occipital area"  . Cataract extraction w/ intraocular lens implant  2009/2010    right/left  . Eye surgery    . Oophorectomy      'not sure which one"  . Appendectomy    . Angioplasty  12/10/2012    "LLE"; LSFA Diamondback orbital rotational arthrectomy, PTA ,  stenting with IDEV stent (left ABI improved from 0.35 to 0.75)  . Abdominal hysterectomy  1976  . Breast biopsy  1960's    "think he did both" (12/06/2012)  . 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 Esophagogastroduodenoscopy  2011  . Colonoscopy  2007    Hemorrhoids and Diverticulosis  . Esophagogastroduodenoscopy N/A 03/17/2014    Procedure: ESOPHAGOGASTRODUODENOSCOPY (EGD);  Surgeon: Irene Shipper, MD;  Location: [facility] ENDOSCOPY;  Service: Endoscopy;  Laterality: N/A;    [id number] colds or fevers, no weight changes Skin:no rashes or ulcers HEENT:no blurred vision, no congestion CV:see HPI PUL:see HPI- uses Nebulizer on occ. GI:no diarrhea constipation + dk stools, no indigestion GU:no hematuria, no dysuria MS:no joint pain, no claudication Neuro:no syncope, no lightheadedness Endo:+  Diabetes stable, no thyroid disease PHYSICAL EXAM BP 126/62  Pulse 77  Ht 5\' 4"  (1.626 m)  Wt 159 lb 9.6 oz (72.394 kg)  BMI 27.38 kg/m2 General:Pleasant affect, NAD Skin:Warm and dry, brisk capillary refill HEENT:normocephalic, sclera clear, mucus membranes moist Neck:supple, no JVD, no bruits  Heart:S1S2 RRR without murmur, gallup, rub or click Lungs:clear without rales, rhonchi, or wheezes [id number], non tender, + BS, do not palpate liver spleen or masses Ext:no lower ext edema, 2+ pedal pulses, 2+ radial pulses Neuro:alert and oriented, MAE, follows commands, + facial symmetry EKG:SR -Sinus arrythmia + PVC improved from 04/17/14  ASSESSMENT AND PLAN CAD (coronary artery disease), 04/16/14 Impala insertion IVUS, rotational atherectomy of the LAD, angioplasty and stenting of the proximal and mid LAD  with Xience Alpine drug-eluting stent Significant coronary artery disease not a candidate for bypass grafting per T CTS. Underwent Impala insertion. IVIS,  rotational of the rectum he of the LAD and angioplasty and stenting of the proximal and mid LAD with science Alpine drug-eluting stent in May of 2015.  Other disease included severe distal left main ostial circumflex and RCA.  We'll continue Brilinta alone one year and then change her to aspirin alone, this is secondary to GI bleed.  No chest pain or shortness of breath today.   GI bleeding Complains of some dark stools currently we'll check her labs.  ANEMIA, IRON DEFICIENCY I have asked her to start taking the iron.  STEMI (ST elevation myocardial infarction) No further chest pain no shortness of breath  HYPERTENSION Controlled blood pressure  Cardiomyopathy, ischemic, EF 35-40% Euvolemic, weight is stable from discharge date. Occasional shortness of breath related to COPD resolves with nebulizer treatment  Hyperlipidemia LDL goal < 70 Lipid Panel     Component Value Date/Time   CHOL 153 04/06/2014 0240   TRIG 50 04/06/2014 0240   HDL 62 04/06/2014 0240   CHOLHDL 2.5 04/06/2014 0240   VLDL 10 04/06/2014 0240   LDLCALC 81 04/06/2014 0240   This was from hospitalization,  Would recheck in 3 months, pt allergic/intolerant to statins.     followup appointment in 2 weeks we'll also check labs and check Hemoccult.

## 2014-04-29 ENCOUNTER — Ambulatory Visit: Payer: Medicare Other | Admitting: Internal Medicine

## 2014-04-30 ENCOUNTER — Telehealth: Payer: Self-pay | Admitting: Internal Medicine

## 2014-04-30 NOTE — Telephone Encounter (Signed)
She absolutely needs an office visit before considering any invasive procedure.

## 2014-04-30 NOTE — Telephone Encounter (Signed)
Pt was seen by cardiology Monday and her CBC has dropped down again. Per pt she was told to call our office and set up a colonoscopy. Pt states she is now taking Brilinta. She had been off of her iron in the hospital and resumed it today TID. Dr. Henrene Pastor please advise if pt should be direct or hospital case, or OV first.

## 2014-04-30 NOTE — Telephone Encounter (Signed)
Is it ok to schedule her with midlevel? 1st available with Dr. Henrene Pastor is July 20th. Please advise.

## 2014-04-30 NOTE — Telephone Encounter (Signed)
Pt scheduled to see Alonza Bogus PA 05/09/14@10am . Pt aware and note made in appt slip to discuss with Dr. Henrene Pastor.

## 2014-04-30 NOTE — Telephone Encounter (Signed)
Yes, mid-level on a day when I am in the office. Make note for mid-level to discuss with me. Thanks

## 2014-05-02 DIAGNOSIS — I255 Ischemic cardiomyopathy: Secondary | ICD-10-CM | POA: Insufficient documentation

## 2014-05-02 DIAGNOSIS — I2510 Atherosclerotic heart disease of native coronary artery without angina pectoris: Secondary | ICD-10-CM | POA: Insufficient documentation

## 2014-05-02 DIAGNOSIS — E785 Hyperlipidemia, unspecified: Secondary | ICD-10-CM | POA: Insufficient documentation

## 2014-05-02 DIAGNOSIS — Z9861 Coronary angioplasty status: Secondary | ICD-10-CM

## 2014-05-02 NOTE — Assessment & Plan Note (Signed)
Euvolemic, weight is stable from discharge date. Occasional shortness of breath related to COPD resolves with nebulizer treatment

## 2014-05-02 NOTE — Assessment & Plan Note (Addendum)
Significant coronary artery disease not a candidate for bypass grafting per T CTS. Underwent Impala insertion. IVIS, rotational of the rectum he of the LAD and angioplasty and stenting of the proximal and mid LAD with science Alpine drug-eluting stent in May of 2015.  Other disease included severe distal left main ostial circumflex and RCA.  We'll continue Brilinta alone one year and then change her to aspirin alone, this is secondary to GI bleed.  No chest pain or shortness of breath today.

## 2014-05-02 NOTE — Assessment & Plan Note (Signed)
Controlled blood pressure.  

## 2014-05-02 NOTE — Assessment & Plan Note (Signed)
No further chest pain no shortness of breath

## 2014-05-02 NOTE — Assessment & Plan Note (Signed)
Complains of some dark stools currently we'll check her labs.

## 2014-05-02 NOTE — Assessment & Plan Note (Signed)
Lipid Panel     Component Value Date/Time   CHOL 153 04/06/2014 0240   TRIG 50 04/06/2014 0240   HDL 62 04/06/2014 0240   CHOLHDL 2.5 04/06/2014 0240   VLDL 10 04/06/2014 0240   LDLCALC 81 04/06/2014 0240   This was from hospitalization,  Would recheck in 3 months, pt allergic/intolerant to statins.

## 2014-05-02 NOTE — Assessment & Plan Note (Signed)
I have asked her to start taking the iron.

## 2014-05-05 ENCOUNTER — Ambulatory Visit: Payer: Medicare Other | Admitting: Internal Medicine

## 2014-05-05 ENCOUNTER — Telehealth: Payer: Self-pay | Admitting: Cardiovascular Disease

## 2014-05-05 NOTE — Telephone Encounter (Signed)
Continuation Medication is COREG . Patient saw Mickel Baas NP 04/28/2014 --office visit, labs drawn-- hgb 8.8; patient will follow up with Dr Blanch Media  Extender on 05/09/14 She wanted to know what to do?

## 2014-05-05 NOTE — Telephone Encounter (Signed)
Per Dr Sallyanne Kuster, stop COREG and follow up with GI. LEFT MESSAGE TO CALL BACK

## 2014-05-05 NOTE — Telephone Encounter (Signed)
RN  Spoke to patient . Information given per Dr Sallyanne Kuster . Keep appointment with GI and appoinmtent on 05/12/14 [facility]. Patient verbalized understanding.

## 2014-05-05 NOTE — Telephone Encounter (Signed)
Please call,she can not take the Coreg. It is making her blood pressure drop and she is SOB.

## 2014-05-05 NOTE — Telephone Encounter (Signed)
Patient states she thinks her medication is causing her to to be SOB ,blood pressure decrease. She states blood pressure has today 100/49, 99/50, 107/53. Yesterday 100/48, 106/54. She states she has COPD,but she is more SOB ,just sitting  Cause her to be SOB awakening frequently at night since last week. Weight:  yesterday 159, next day 156.5 ,158.2lbs,158.2 lbs,158.2 lb.  She is taking Lasix 20 mg in morning.

## 2014-05-05 NOTE — Telephone Encounter (Signed)
Returning your call. °

## 2014-05-08 ENCOUNTER — Telehealth: Payer: Self-pay | Admitting: Cardiovascular Disease

## 2014-05-08 NOTE — Telephone Encounter (Signed)
RN spoke to patient. Patient states she called primary and received lab. She was concerned  Patient thought Dr Gwenlyn Found would order transfusion.  RN informed patient that her GI will follow- that is why she has an appointment with GI's PA tomorrow. She verbalized understanding.

## 2014-05-08 NOTE — Telephone Encounter (Signed)
I agree

## 2014-05-08 NOTE — Telephone Encounter (Signed)
Pt wants you know she just received a call from her primary doctor,they said her hemoglobin is 7.6. Please call,she is very concerned.

## 2014-05-09 ENCOUNTER — Inpatient Hospital Stay (HOSPITAL_COMMUNITY)
Admission: EM | Admit: 2014-05-09 | Discharge: 2014-05-11 | DRG: 812 | Disposition: A | Discharge status: Home / Self Care | Payer: Medicare Other | Attending: Family Medicine | Admitting: Family Medicine

## 2014-05-09 ENCOUNTER — Ambulatory Visit (INDEPENDENT_AMBULATORY_CARE_PROVIDER_SITE_OTHER): Payer: Medicare Other | Admitting: Gastroenterology

## 2014-05-09 ENCOUNTER — Encounter: Payer: Self-pay | Admitting: Gastroenterology

## 2014-05-09 ENCOUNTER — Other Ambulatory Visit: Payer: Medicare Other

## 2014-05-09 ENCOUNTER — Encounter (HOSPITAL_COMMUNITY): Payer: Self-pay | Admitting: Emergency Medicine

## 2014-05-09 ENCOUNTER — Encounter: Payer: Self-pay | Admitting: Cardiovascular Disease

## 2014-05-09 ENCOUNTER — Encounter (HOSPITAL_COMMUNITY): Payer: Self-pay | Admitting: Internal Medicine

## 2014-05-09 ENCOUNTER — Emergency Department (HOSPITAL_COMMUNITY): Payer: Medicare Other

## 2014-05-09 VITALS — BP 110/60 | HR 80 | Ht 64.0 in | Wt 160.8 lb

## 2014-05-09 DIAGNOSIS — K922 Gastrointestinal hemorrhage, unspecified: Secondary | ICD-10-CM | POA: Diagnosis present

## 2014-05-09 DIAGNOSIS — R195 Other fecal abnormalities: Secondary | ICD-10-CM

## 2014-05-09 DIAGNOSIS — Z7982 Long term (current) use of aspirin: Secondary | ICD-10-CM

## 2014-05-09 DIAGNOSIS — F411 Generalized anxiety disorder: Secondary | ICD-10-CM | POA: Diagnosis present

## 2014-05-09 DIAGNOSIS — I2510 Atherosclerotic heart disease of native coronary artery without angina pectoris: Secondary | ICD-10-CM | POA: Diagnosis present

## 2014-05-09 DIAGNOSIS — E785 Hyperlipidemia, unspecified: Secondary | ICD-10-CM | POA: Diagnosis present

## 2014-05-09 DIAGNOSIS — D649 Anemia, unspecified: Secondary | ICD-10-CM

## 2014-05-09 DIAGNOSIS — J4489 Other specified chronic obstructive pulmonary disease: Secondary | ICD-10-CM | POA: Diagnosis present

## 2014-05-09 DIAGNOSIS — S98139A Complete traumatic amputation of one unspecified lesser toe, initial encounter: Secondary | ICD-10-CM

## 2014-05-09 DIAGNOSIS — IMO0002 Reserved for concepts with insufficient information to code with codable children: Secondary | ICD-10-CM

## 2014-05-09 DIAGNOSIS — D539 Nutritional anemia, unspecified: Secondary | ICD-10-CM | POA: Insufficient documentation

## 2014-05-09 DIAGNOSIS — Z9861 Coronary angioplasty status: Secondary | ICD-10-CM

## 2014-05-09 DIAGNOSIS — D500 Iron deficiency anemia secondary to blood loss (chronic): Principal | ICD-10-CM | POA: Diagnosis present

## 2014-05-09 DIAGNOSIS — I255 Ischemic cardiomyopathy: Secondary | ICD-10-CM | POA: Diagnosis present

## 2014-05-09 DIAGNOSIS — K219 Gastro-esophageal reflux disease without esophagitis: Secondary | ICD-10-CM | POA: Diagnosis present

## 2014-05-09 DIAGNOSIS — E119 Type 2 diabetes mellitus without complications: Secondary | ICD-10-CM | POA: Diagnosis present

## 2014-05-09 DIAGNOSIS — R143 Flatulence: Secondary | ICD-10-CM

## 2014-05-09 DIAGNOSIS — I5022 Chronic systolic (congestive) heart failure: Secondary | ICD-10-CM | POA: Diagnosis present

## 2014-05-09 DIAGNOSIS — Z79899 Other long term (current) drug therapy: Secondary | ICD-10-CM

## 2014-05-09 DIAGNOSIS — I739 Peripheral vascular disease, unspecified: Secondary | ICD-10-CM | POA: Diagnosis present

## 2014-05-09 DIAGNOSIS — D509 Iron deficiency anemia, unspecified: Secondary | ICD-10-CM

## 2014-05-09 DIAGNOSIS — R140 Abdominal distension (gaseous): Secondary | ICD-10-CM

## 2014-05-09 DIAGNOSIS — J449 Chronic obstructive pulmonary disease, unspecified: Secondary | ICD-10-CM | POA: Diagnosis present

## 2014-05-09 DIAGNOSIS — R142 Eructation: Secondary | ICD-10-CM

## 2014-05-09 DIAGNOSIS — I10 Essential (primary) hypertension: Secondary | ICD-10-CM | POA: Diagnosis present

## 2014-05-09 DIAGNOSIS — Z87891 Personal history of nicotine dependence: Secondary | ICD-10-CM

## 2014-05-09 DIAGNOSIS — I214 Non-ST elevation (NSTEMI) myocardial infarction: Secondary | ICD-10-CM | POA: Diagnosis present

## 2014-05-09 DIAGNOSIS — I2589 Other forms of chronic ischemic heart disease: Secondary | ICD-10-CM | POA: Diagnosis present

## 2014-05-09 DIAGNOSIS — I5043 Acute on chronic combined systolic (congestive) and diastolic (congestive) heart failure: Secondary | ICD-10-CM

## 2014-05-09 DIAGNOSIS — R141 Gas pain: Secondary | ICD-10-CM

## 2014-05-09 DIAGNOSIS — I509 Heart failure, unspecified: Secondary | ICD-10-CM | POA: Diagnosis present

## 2014-05-09 LAB — CBC WITH DIFFERENTIAL/PLATELET
BASOS ABS: 0 10*3/uL (ref 0.0–0.1)
BASOS PCT: 0 % (ref 0–1)
EOS ABS: 0.1 10*3/uL (ref 0.0–0.7)
Eosinophils Relative: 1 % (ref 0–5)
HCT: 24.2 % — ABNORMAL LOW (ref 36.0–46.0)
Hemoglobin: 7.2 g/dL — ABNORMAL LOW (ref 12.0–15.0)
Lymphocytes Relative: 7 % — ABNORMAL LOW (ref 12–46)
Lymphs Abs: 0.6 10*3/uL — ABNORMAL LOW (ref 0.7–4.0)
MCH: 25.5 pg — AB (ref 26.0–34.0)
MCHC: 29.8 g/dL — AB (ref 30.0–36.0)
MCV: 85.8 fL (ref 78.0–100.0)
MONOS PCT: 6 % (ref 3–12)
Monocytes Absolute: 0.4 10*3/uL (ref 0.1–1.0)
NEUTROS ABS: 6.6 10*3/uL (ref 1.7–7.7)
Neutrophils Relative %: 86 % — ABNORMAL HIGH (ref 43–77)
Platelets: 232 10*3/uL (ref 150–400)
RBC: 2.82 MIL/uL — ABNORMAL LOW (ref 3.87–5.11)
RDW: 20 % — AB (ref 11.5–15.5)
WBC: 7.7 10*3/uL (ref 4.0–10.5)

## 2014-05-09 LAB — COMPREHENSIVE METABOLIC PANEL
ALBUMIN: 3.4 g/dL — AB (ref 3.5–5.2)
ALT: 14 U/L (ref 0–35)
AST: 15 U/L (ref 0–37)
Alkaline Phosphatase: 100 U/L (ref 39–117)
BILIRUBIN TOTAL: 0.2 mg/dL — AB (ref 0.3–1.2)
BUN: 12 mg/dL (ref 6–23)
CHLORIDE: 100 meq/L (ref 96–112)
CO2: 27 mEq/L (ref 19–32)
Calcium: 9.7 mg/dL (ref 8.4–10.5)
Creatinine, Ser: 0.99 mg/dL (ref 0.50–1.10)
GFR calc Af Amer: 63 mL/min — ABNORMAL LOW (ref >90)
GFR calc non Af Amer: 55 mL/min — ABNORMAL LOW (ref >90)
Glucose, Bld: 303 mg/dL — ABNORMAL HIGH (ref 70–99)
Potassium: 4.4 mEq/L (ref 3.7–5.3)
Sodium: 140 mEq/L (ref 137–147)
TOTAL PROTEIN: 6.7 g/dL (ref 6.0–8.3)

## 2014-05-09 LAB — PREPARE RBC (CROSSMATCH)

## 2014-05-09 LAB — POC OCCULT BLOOD, ED: Fecal Occult Bld: POSITIVE — AB

## 2014-05-09 LAB — RETICULOCYTES
RBC.: 2.87 MIL/uL — ABNORMAL LOW (ref 3.87–5.11)
RETIC CT PCT: 5.4 % — AB (ref 0.4–3.1)
Retic Count, Absolute: 155 10*3/uL (ref 19.0–186.0)

## 2014-05-09 LAB — I-STAT TROPONIN, ED: TROPONIN I, POC: 0.04 ng/mL (ref 0.00–0.08)

## 2014-05-09 LAB — CBG MONITORING, ED: Glucose-Capillary: 144 mg/dL — ABNORMAL HIGH (ref 70–99)

## 2014-05-09 LAB — PRO B NATRIURETIC PEPTIDE: Pro B Natriuretic peptide (BNP): 8343 pg/mL — ABNORMAL HIGH (ref 0–125)

## 2014-05-09 LAB — GLUCOSE, CAPILLARY: Glucose-Capillary: 286 mg/dL — ABNORMAL HIGH (ref 70–99)

## 2014-05-09 MED ORDER — FUROSEMIDE 20 MG PO TABS
20.0000 mg | ORAL_TABLET | Freq: Every day | ORAL | Status: DC
  Administered 2014-05-10 – 2014-05-11 (×2): 20 mg via ORAL
  Filled 2014-05-09 (×2): qty 1

## 2014-05-09 MED ORDER — FUROSEMIDE 10 MG/ML IJ SOLN
40.0000 mg | Freq: Once | INTRAMUSCULAR | Status: AC
  Administered 2014-05-09: 40 mg via INTRAVENOUS
  Filled 2014-05-09: qty 4

## 2014-05-09 MED ORDER — SIMETHICONE 80 MG PO CHEW
80.0000 mg | CHEWABLE_TABLET | Freq: Four times a day (QID) | ORAL | Status: DC | PRN
  Administered 2014-05-10: 80 mg via ORAL
  Filled 2014-05-09 (×2): qty 1

## 2014-05-09 MED ORDER — CYCLOSPORINE 0.05 % OP EMUL
1.0000 [drp] | Freq: Two times a day (BID) | OPHTHALMIC | Status: DC
  Administered 2014-05-09 – 2014-05-11 (×4): 1 [drp] via OPHTHALMIC
  Filled 2014-05-09 (×6): qty 1

## 2014-05-09 MED ORDER — INSULIN ASPART 100 UNIT/ML ~~LOC~~ SOLN: 0.0000 [IU] | Freq: Every day | SUBCUTANEOUS | Status: DC

## 2014-05-09 MED ORDER — IPRATROPIUM-ALBUTEROL 0.5-2.5 (3) MG/3ML IN SOLN: 3.0000 mL | RESPIRATORY_TRACT | Status: DC | PRN

## 2014-05-09 MED ORDER — CARVEDILOL 3.125 MG PO TABS
3.1250 mg | ORAL_TABLET | Freq: Two times a day (BID) | ORAL | Status: DC
  Administered 2014-05-11: 3.125 mg via ORAL
  Filled 2014-05-09 (×5): qty 1

## 2014-05-09 MED ORDER — ASPIRIN EC 81 MG PO TBEC
81.0000 mg | DELAYED_RELEASE_TABLET | Freq: Every day | ORAL | Status: DC
  Administered 2014-05-10 – 2014-05-11 (×2): 81 mg via ORAL
  Filled 2014-05-09 (×2): qty 1

## 2014-05-09 MED ORDER — GLUCERNA SHAKE PO LIQD: 237.0000 mL | Freq: Every day | ORAL | Status: DC

## 2014-05-09 MED ORDER — FLUTICASONE PROPIONATE 50 MCG/ACT NA SUSP: 1.0000 | Freq: Every evening | NASAL | Status: DC | PRN

## 2014-05-09 MED ORDER — TICAGRELOR 90 MG PO TABS
90.0000 mg | ORAL_TABLET | Freq: Two times a day (BID) | ORAL | Status: DC
  Administered 2014-05-09 – 2014-05-11 (×4): 90 mg via ORAL
  Filled 2014-05-09 (×5): qty 1

## 2014-05-09 MED ORDER — ALBUTEROL SULFATE (2.5 MG/3ML) 0.083% IN NEBU
2.5000 mg | INHALATION_SOLUTION | Freq: Four times a day (QID) | RESPIRATORY_TRACT | Status: DC
  Administered 2014-05-09 – 2014-05-11 (×8): 2.5 mg via RESPIRATORY_TRACT
  Filled 2014-05-09 (×8): qty 3

## 2014-05-09 MED ORDER — METFORMIN HCL 500 MG PO TABS
500.0000 mg | ORAL_TABLET | Freq: Two times a day (BID) | ORAL | Status: DC
  Administered 2014-05-10 – 2014-05-11 (×2): 500 mg via ORAL
  Filled 2014-05-09 (×5): qty 1

## 2014-05-09 MED ORDER — TIOTROPIUM BROMIDE MONOHYDRATE 18 MCG IN CAPS
18.0000 ug | ORAL_CAPSULE | Freq: Every day | RESPIRATORY_TRACT | Status: DC
  Administered 2014-05-11: 18 ug via RESPIRATORY_TRACT
  Filled 2014-05-09 (×2): qty 5

## 2014-05-09 MED ORDER — INSULIN ASPART 100 UNIT/ML ~~LOC~~ SOLN
0.0000 [IU] | Freq: Three times a day (TID) | SUBCUTANEOUS | Status: DC
  Administered 2014-05-09: 2 [IU] via SUBCUTANEOUS
  Administered 2014-05-10: 5 [IU] via SUBCUTANEOUS
  Administered 2014-05-10: 2 [IU] via SUBCUTANEOUS
  Administered 2014-05-11: 5 [IU] via SUBCUTANEOUS
  Filled 2014-05-09: qty 1

## 2014-05-09 MED ORDER — NITROFURANTOIN MONOHYD MACRO 100 MG PO CAPS
100.0000 mg | ORAL_CAPSULE | Freq: Two times a day (BID) | ORAL | Status: DC
  Administered 2014-05-09 – 2014-05-11 (×4): 100 mg via ORAL
  Filled 2014-05-09 (×5): qty 1

## 2014-05-09 MED ORDER — FAMOTIDINE 20 MG PO TABS
20.0000 mg | ORAL_TABLET | Freq: Two times a day (BID) | ORAL | Status: DC
  Administered 2014-05-09 – 2014-05-11 (×4): 20 mg via ORAL
  Filled 2014-05-09 (×5): qty 1

## 2014-05-09 MED ORDER — FERROUS SULFATE 325 (65 FE) MG PO TABS
325.0000 mg | ORAL_TABLET | Freq: Two times a day (BID) | ORAL | Status: DC
  Administered 2014-05-10 – 2014-05-11 (×3): 325 mg via ORAL
  Filled 2014-05-09 (×5): qty 1

## 2014-05-09 MED ORDER — ALPRAZOLAM 0.25 MG PO TABS: 0.2500 mg | ORAL_TABLET | Freq: Two times a day (BID) | ORAL | Status: DC | PRN

## 2014-05-09 MED ORDER — PREDNISONE 5 MG PO TABS: 5.0000 mg | ORAL_TABLET | ORAL | Status: DC

## 2014-05-09 MED ORDER — LATANOPROST 0.005 % OP SOLN
1.0000 [drp] | Freq: Every day | OPHTHALMIC | Status: DC
Start: 2014-05-09 — End: 2014-05-11
  Administered 2014-05-09 – 2014-05-10 (×2): 1 [drp] via OPHTHALMIC
  Filled 2014-05-09: qty 2.5

## 2014-05-09 MED ORDER — BUDESONIDE-FORMOTEROL FUMARATE 160-4.5 MCG/ACT IN AERO
2.0000 | INHALATION_SPRAY | Freq: Every evening | RESPIRATORY_TRACT | Status: DC
Start: 2014-05-09 — End: 2014-05-11
  Administered 2014-05-09 – 2014-05-11 (×2): 2 via RESPIRATORY_TRACT
  Filled 2014-05-09 (×2): qty 6

## 2014-05-09 NOTE — ED Notes (Signed)
Pt given dinner tray.

## 2014-05-09 NOTE — ED Provider Notes (Signed)
CSN: [id number]     Arrival date & time 05/09/14  1305 History   First MD Initiated Contact with Patient 05/09/14 1321     Chief Complaint  Patient presents with  . Weakness  . Shortness of Breath     (Consider location/radiation/quality/duration/timing/severity/associated sxs/prior Treatment) Patient is a 73 y.o. female presenting with weakness and shortness of breath. The history is provided by the patient.  Weakness This is a new problem. The current episode started 2 days ago. The problem occurs constantly. The problem has been gradually worsening. Associated symptoms include abdominal pain and shortness of breath. Pertinent negatives include no chest pain and no headaches. Associated symptoms comments: Sob that is much worse over the last 2 days.  Normally just SOB with exertion but now gets SOB with only minimal movement.  No orthopnea.  Also in last 1 week has had cough that is non-productive that improves with inhalers.  Denies fever. The symptoms are aggravated by exertion and walking. The symptoms are relieved by rest (O2). She has tried nothing for the symptoms. The treatment provided no relief.  Shortness of Breath Onset quality:  Gradual Duration:  2 days Timing:  Constant Progression:  Worsening Chronicity:  Recurrent Relieved by:  Rest, oxygen and inhaler Worsened by:  Activity Associated symptoms: abdominal pain and cough   Associated symptoms: no chest pain, no fever, no headaches, no hemoptysis, no vomiting and no wheezing   Risk factors comment:  Recent hx of CHF, MI, GI bleed and resp failure   Past Medical History  Diagnosis Date  . COPD (chronic obstructive pulmonary disease)   . Hypertension   . Asthma   . Psoriatic arthritis   . Psoriasis   . GERD (gastroesophageal reflux disease)   . Hiatal hernia   . Chronic sinus infection   . Heart murmur   . Blood transfusion ~ 1962    "11 units of blood"  . Ankylosing spondylitis   . Chronic back pain   .  Dysrhythmia     "palpitations"  . Esophageal dilatation   . Candida esophagitis   . Cirrhosis of liver without mention of alcohol   . Hypercholesteremia   . Peripheral vascular disease   . Exertional dyspnea   . Pneumonia 10/07/11; 2006; 1980's  . Chronic bronchitis     "since childhood" (12/06/2012)  . Type II diabetes mellitus   . Iron deficiency anemia   . External bleeding hemorrhoids   . Sinus headache   . Arthritis     "psorasic"  . OA (osteoarthritis)   . DJD (degenerative joint disease) of knee     "both" (12/06/2012)  . Carpal tunnel syndrome on right   . Anxiety   . Barrett esophagus   . Non-healing ulcer of foot, secondary to diabetes and PVD 12/07/2012  . PVD (peripheral vascular disease) 12/07/2012  . Tobacco abuse 12/07/2012  . Critical lower limb ischemia, with ulcer Lt foot 12/07/2012  . Gout   . COPD (chronic obstructive pulmonary disease)   . Carotid artery disease     dopplers showed mod L ICA stenosis   . PAD (peripheral artery disease)   . History of nuclear stress test 11/2012    lexiscan; normal, low risk   . Dermatophytosis of nail [id number]  . Psoriasis   . GI bleed   . Diverticulosis    Past Surgical History  Procedure Laterality Date  . Nasal sinus surgery  09/29/2009  . Carpal tunnel release  2011  left hand  . Lipoma excision  2010    "left occipital area"  . Cataract extraction w/ intraocular lens implant  2009/2010    right/left  . Eye surgery    . Oophorectomy      'not sure which one"  . Appendectomy    . Angioplasty  12/10/2012    "LLE"; LSFA [place] orbital rotational arthrectomy, PTA , stenting with IDEV stent (left ABI improved from 0.35 to 0.75)  . Abdominal hysterectomy  1976  . Breast biopsy  1960's    "think he did both" (12/06/2012)  . 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  Esophagogastroduodenoscopy  2011  . Colonoscopy  2007    Hemorrhoids and Diverticulosis  . Esophagogastroduodenoscopy N/A 03/17/2014    Procedure: ESOPHAGOGASTRODUODENOSCOPY (EGD);  Surgeon: Irene Shipper, MD;  Location: [facility] ENDOSCOPY;  Service: Endoscopy;  Laterality: N/A;   Family History  Problem Relation Age of Onset  . Lupus Sister     x2  . Lung cancer Sister   . Thyroid disease Sister   . Colon cancer Neg Hx   . Cancer Mother   . Heart Problems Maternal Grandmother   . Cancer Paternal Grandfather   . Diabetes Brother     x2  . Arthritis Brother     x3  . Arthritis Sister     x3   History  Substance Use Topics  . Smoking status: Former Smoker -- 1.00 packs/day for 40 years    Types: Cigarettes  . Smokeless tobacco: Former Systems developer    Quit date: 09/28/2013     Comment: pt states that she smokes "every once in a while"  . Alcohol Use: No     Comment: "quit social drinking years ago"   OB History   Grav Para Term Preterm Abortions TAB SAB Ect Mult Living                 Review of Systems  Constitutional: Negative for fever.  Respiratory: Positive for cough and shortness of breath. Negative for hemoptysis and wheezing.   Cardiovascular: Negative for chest pain.  Gastrointestinal: Positive for abdominal pain. Negative for vomiting.  Neurological: Positive for weakness. Negative for headaches.  All other systems reviewed and are negative.     Allergies  Amlodipine besy-benazepril hcl; Statins; Tradjenta; Plavix; and Raptiva  Home Medications   Prior to Admission medications   Medication Sig Start Date End Date Taking? Authorizing Provider  albuterol (PROVENTIL HFA;VENTOLIN HFA) 108 (90 BASE) MCG/ACT inhaler Inhale 1 puff into the lungs every 6 (six) hours as needed. FOR SHORTNESS OF BREATH    Historical Provider, MD  ALPRAZolam Duanne Moron) 0.5 MG tablet Take 1 tablet (0.5 mg total) by mouth 2 (two) times daily as needed for anxiety or sleep. 12/04/13   Robbie Lis, MD   ammonium lactate (LAC-HYDRIN) 12 % lotion Apply 1 application topically as needed for dry skin (psorias).    Historical Provider, MD  aspirin 81 MG tablet Take 81 mg by mouth daily.    Historical Provider, MD  Brinzolamide-Brimonidine [facility]) 1-0.2 % SUSP Place 1 drop into the right eye 3 (three) times daily.     Historical Provider, MD  budesonide-formoterol (SYMBICORT) 160-4.5 MCG/ACT inhaler Inhale 2 puffs into the lungs every evening.    Historical Provider, MD  carvedilol (COREG) 3.125 MG tablet Take 1 tablet (3.125 mg total) by  mouth 2 (two) times daily with a meal. 04/21/14   Lendon Colonel, NP  Clobetasol Propionate (TEMOVATE) 0.05 % external spray Apply 1 spray topically 2 (two) times daily as needed. For psoriasis flare-ups    Historical Provider, MD  esomeprazole (NEXIUM) 40 MG capsule Take 40 mg by mouth daily before breakfast.    Historical Provider, MD  feeding supplement, GLUCERNA SHAKE, (GLUCERNA SHAKE) LIQD Take 237 mLs by mouth daily.     Historical Provider, MD  ferrous sulfate 325 (65 FE) MG tablet Take 1 tablet (325 mg total) by mouth 2 (two) times daily with a meal. 03/21/14   Bobby Rumpf [place], PA-C  fluticasone (FLONASE) 50 MCG/ACT nasal spray Place 2 sprays into both nostrils daily as needed for allergies.     Historical Provider, MD  furosemide (LASIX) 40 MG tablet Take 20 mg by mouth daily.  03/04/14   Historical Provider, MD  ipratropium-albuterol (DUONEB) 0.5-2.5 (3) MG/3ML SOLN Take 3 mLs by nebulization every 4 (four) hours as needed (shortness of breath).    Historical Provider, MD  Liniments ([employer] ARTHRITIS PAIN RELIEVER EX) Apply 1 application topically daily as needed (for shoulder pains).    Historical Provider, MD  metFORMIN (GLUCOPHAGE) 500 MG tablet Take 500 mg by mouth 2 (two) times daily with a meal.    Historical Provider, MD  Multiple Vitamins-Minerals (ICAPS) CAPS Take 1 capsule by mouth daily.    Historical Provider, MD  naproxen sodium (ANAPROX) 220 MG  tablet Take 220 mg by mouth as needed.     Historical Provider, MD  nitrofurantoin, macrocrystal-monohydrate, (MACROBID) 100 MG capsule Take 1 capsule (100 mg total) by mouth every 12 (twelve) hours. 04/21/14   Lendon Colonel, NP  predniSONE (DELTASONE) 5 MG tablet Take 5 mg by mouth every Monday, Wednesday, and Friday.     Historical Provider, MD  ranitidine (ZANTAC) 150 MG capsule Take 1 capsule by mouth at bedtime. 02/20/14   Historical Provider, MD  RESTASIS 0.05 % ophthalmic emulsion Place 1 drop into both eyes 2 (two) times daily.  03/07/14   Historical Provider, MD  simethicone (MYLICON) 80 MG chewable tablet Chew 1 tablet (80 mg total) by mouth 4 (four) times daily as needed for flatulence. 12/04/13   Robbie Lis, MD  ticagrelor (BRILINTA) 90 MG TABS tablet Take 1 tablet (90 mg total) by mouth 2 (two) times daily. 04/21/14   Lendon Colonel, NP  tiotropium (SPIRIVA) 18 MCG inhalation capsule Place 1 capsule (18 mcg total) into inhaler and inhale daily. 12/04/13   Robbie Lis, MD  Travoprost, BAK Free, (TRAVATAMN) 0.004 % SOLN ophthalmic solution Place 1 drop into both eyes at bedtime.     Historical Provider, MD   BP 128/48  Pulse 85  Temp(Src) 98.6 F (37 C) (Oral)  Resp 18  Ht 5\' 4"  (1.626 m)  Wt 157 lb 8 oz (71.442 kg)  BMI 27.02 kg/m2  SpO2 94% Physical Exam  Nursing note and vitals reviewed. Constitutional: She is oriented to person, place, and time. She appears well-developed and well-nourished. No distress.  HENT:  Head: Normocephalic and atraumatic.  Mouth/Throat: Oropharynx is clear and moist.  Eyes: Conjunctivae and EOM are normal. Pupils are equal, round, and reactive to light.  Neck: Normal range of motion. Neck supple.  Cardiovascular: Normal rate, regular rhythm and intact distal pulses.   No murmur heard. Pulmonary/Chest: Not tachypneic. No respiratory distress. She has decreased breath sounds. She has no wheezes. She has no rales.  Abdominal: Soft. She  exhibits no distension. There is no tenderness. There is no rebound and no guarding.  Musculoskeletal: Normal range of motion. She exhibits no edema and no tenderness.  Neurological: She is alert and oriented to person, place, and time.  Skin: Skin is warm and dry. No rash noted. No erythema. There is pallor.  Psychiatric: She has a normal mood and affect. Her behavior is normal.    ED Course  Procedures (including critical care time) Labs Review Labs Reviewed  PRO B NATRIURETIC PEPTIDE  CBC WITH DIFFERENTIAL  COMPREHENSIVE METABOLIC PANEL  I-STAT [place], ED  TYPE AND SCREEN    Imaging Review No results found.   EKG Interpretation   Date/Time:  Friday May 09 2014 13:19:40 EDT Ventricular Rate:  85 PR Interval:  128 QRS Duration: 68 QT Interval:  336 QTC Calculation: 399 R Axis:   -33 Text Interpretation:  Sinus rhythm with Premature atrial complexes Left  axis deviation Septal infarct , age undetermined Artifact No significant  change since last tracing Confirmed by [facility]  MD, Loree Fee ([id number]) on  05/09/2014 1:40:14 PM      MDM   Final diagnoses:  None    Patient with extensive medical problems with recent hospitalizations for respiratory failure, anemia from GI bleed, MI preceding 2 drug-eluting stents has been home approximately 2 weeks and presents today with 2 days of worsening shortness of breath. She has a nonproductive cough denies fever.  She saw her gastroenterologist today who sent her here for repeat hemoglobin for further evaluation. Patient's hemoglobin upon leaving the hospital with 8 after transfusion.  Patient states she is normally short of breath with exertion but now gets short of breath with only minimal movements. She is able to speak in complete sentences. No peripheral edema.  Concern for recurrent anemia as the source of patient's shortness of breath versus COPD exacerbation versus CHF as patient has had known CHF in the past.  EKG  without significant change. BMP, CBC, CMP, troponin, type and screen, chest x-ray pending  4:08 PM Labs pending.  Pt checked out to Dr. Tawnya Crook at 1600.  Blanchie Dessert, MD 05/09/14 [phone]

## 2014-05-09 NOTE — ED Notes (Signed)
Pt was discharged on memorial day from being anemic/sob/ventilator/heart attack.  Pt is here today with low hemoglobin to have cbc and be admitted for 24 hour observation.

## 2014-05-09 NOTE — Patient Instructions (Addendum)
Please go the emergency room today--[place] or [place] are scheduled for your Virtual Colonoscopy with [facility] on 05-20-2014 @ 8 am. [facility] is located at [address], [address] Phone Number to reschedule is: [phone] Please go four days before Colonoscopy to pick up prep

## 2014-05-09 NOTE — ED Notes (Signed)
Patient stated she was here for a blood transfusion.  Verbal order given by sabrina to do an ekg.

## 2014-05-09 NOTE — H&P (Signed)
[facility] History and Physical  [name] XAJ:[id number] DOB: 07/04/1941    PCP:   Leola Brazil, MD   Chief Complaint: symptomatic anemia, sent in from Dr Henrene Pastor of  GI for transfusion.  HPI: [name] is an 73 year old woman sent in from Dr Henrene Pastor office for symptomatic anemia for transfusion.  She has hx of  congestive heart failure over the last several months. She's had non-ST elevation infarction and has been found to have severe proximal LAD stenosis, ulcerated stenosis of the left mainstem, and involvement of the proximal left circumflex. She also has hx of anemia, and had negative upper endoscopy, and planned to have virtual colonoscopy as she has too much risk for optical colonoscopy.  She had a Hb of 7.6 grams and felt weak and shortness of breath, so she was sent in for transfusion.  Dr Henrene Pastor was not convinced her anemia is from GI bleeding, but her lower GI hadn't yet to be fully evaluated.  Work up in the ER showed Hb of 7.2, with normal Cr, and BUN of 12.  Her MCV was 85 with normal platelet count.  Hospitalist was asked to admit her for the aforementioned reason.  Rewiew of Systems:  Constitutional: Negative for  fever and chills. No significant weight loss or weight gain Eyes: Negative for eye pain, redness and discharge, diplopia, visual changes, or flashes of light. ENMT: Negative for ear pain, hoarseness, nasal congestion, sinus pressure and sore throat. No headaches; tinnitus, drooling, or problem swallowing. Cardiovascular: Negative for chest pain, palpitations, diaphoresis,  and peripheral edema. ; No orthopnea, PND Respiratory: Negative for cough, hemoptysis, wheezing and stridor. No pleuritic chestpain. Gastrointestinal: Negative for nausea, vomiting, diarrhea, abdominal pain, melena, blood in stool, hematemesis, jaundice and rectal bleeding.    Genitourinary: Negative for frequency, dysuria, incontinence,flank pain and  hematuria; Musculoskeletal: Negative for back pain and neck pain. Negative for swelling and trauma.;  Skin: . Negative for pruritus, rash, abrasions, bruising and skin lesion.; ulcerations Neuro: Negative for headache, lightheadedness and neck stiffness. Negative for weakness, altered level of consciousness , altered mental status, extremity weakness, burning feet, involuntary movement, seizure and syncope.  Psych: negative for anxiety, depression, insomnia, tearfulness, panic attacks, hallucinations, paranoia, suicidal or homicidal ideation    Past Medical History  Diagnosis Date  . COPD (chronic obstructive pulmonary disease)   . Hypertension   . Asthma   . Psoriatic arthritis   . Psoriasis   . GERD (gastroesophageal reflux disease)   . Hiatal hernia   . Chronic sinus infection   . Heart murmur   . Blood transfusion ~ 1962    "11 units of blood"  . Ankylosing spondylitis   . Chronic back pain   . Dysrhythmia     "palpitations"  . Esophageal dilatation   . Candida esophagitis   . Cirrhosis of liver without mention of alcohol   . Hypercholesteremia   . Peripheral vascular disease   . Exertional dyspnea   . Pneumonia 10/07/11; 2006; 1980's  . Chronic bronchitis     "since childhood" (12/06/2012)  . Type II diabetes mellitus   . Iron deficiency anemia   . External bleeding hemorrhoids   . Sinus headache   . Arthritis     "psorasic"  . OA (osteoarthritis)   . DJD (degenerative joint disease) of knee     "both" (12/06/2012)  . Carpal tunnel syndrome on right   . Anxiety   . Barrett esophagus   . Non-healing ulcer  of foot, secondary to diabetes and PVD 12/07/2012  . PVD (peripheral vascular disease) 12/07/2012  . Tobacco abuse 12/07/2012  . Critical lower limb ischemia, with ulcer Lt foot 12/07/2012  . Gout   . COPD (chronic obstructive pulmonary disease)   . Carotid artery disease     dopplers showed mod L ICA stenosis   . PAD (peripheral artery disease)   . History of  nuclear stress test 11/2012    lexiscan; normal, low risk   . Dermatophytosis of nail [id number]  . Psoriasis   . GI bleed   . Diverticulosis     Past Surgical History  Procedure Laterality Date  . Nasal sinus surgery  09/29/2009  . Carpal tunnel release  2011    left hand  . Lipoma excision  2010    "left occipital area"  . Cataract extraction w/ intraocular lens implant  2009/2010    right/left  . Eye surgery    . Oophorectomy      'not sure which one"  . Appendectomy    . Angioplasty  12/10/2012    "LLE"; LSFA [place] orbital rotational arthrectomy, PTA , stenting with IDEV stent (left ABI improved from 0.35 to 0.75)  . Abdominal hysterectomy  1976  . Breast biopsy  1960's    "think he did both" (12/06/2012)  . Transmetatarsal amputation Left 01/08/2013    Procedure: TRANSMETATARSAL AMPUTATION;  Surgeon: Angelia Mould, MD;  Location: [place];  Service: Vascular;  Laterality: Left;  . Esophagogastroduodenoscopy  2011  . Colonoscopy  2007    Hemorrhoids and Diverticulosis  . Esophagogastroduodenoscopy N/A 03/17/2014    Procedure: ESOPHAGOGASTRODUODENOSCOPY (EGD);  Surgeon: Irene Shipper, MD;  Location: [facility] END[facility];  Service: Endoscopy;  Laterality: N/A;    Medications:  HOME MEDS: Prior to Admission medications   Medication Sig Start Date End Date Taking? Authorizing Provider  albuterol (PROVENTIL HFA;VENTOLIN HFA) 108 (90 BASE) MCG/ACT inhaler Inhale 1 puff into the lungs every 6 (six) hours as needed. FOR SHORTNESS OF BREATH   Yes Historical Provider, MD  ALPRAZolam (XANAX) 0.5 MG tablet Take 0.25 mg by mouth 2 (two) times daily as needed for anxiety or sleep.   Yes Historical Provider, MD  ammonium lactate (LAC-HYDRIN) 12 % lotion Apply 1 application topically daily as needed for dry skin (psorias).    Yes Historical Provider, MD  aspirin 81 MG tablet Take 81 mg by mouth daily.   Yes Historical Provider, MD  Brinzolamide-Brimonidine [facility]) 1-0.2 % SUSP Place 1  drop into the right eye 3 (three) times daily.    Yes Historical Provider, MD  budesonide-formoterol (SYMBICORT) 160-4.5 MCG/ACT inhaler Inhale 2 puffs into the lungs every evening.   Yes Historical Provider, MD  esomeprazole (NEXIUM) 40 MG capsule Take 40 mg by mouth daily before breakfast.   Yes Historical Provider, MD  feeding supplement, GLUCERNA SHAKE, (GLUCERNA SHAKE) LIQD Take 237 mLs by mouth daily.    Yes Historical Provider, MD  ferrous sulfate 325 (65 FE) MG tablet Take 1 tablet (325 mg total) by mouth 2 (two) times daily with a meal. 03/21/14  Yes Bobby Rumpf York, PA-C  fluticasone (FLONASE) 50 MCG/ACT nasal spray Place 1 spray into both nostrils at bedtime as needed for allergies.    Yes Historical Provider, MD  furosemide (LASIX) 40 MG tablet Take 20 mg by mouth daily.  03/04/14  Yes Historical Provider, MD  ipratropium-albuterol (DUONEB) 0.5-2.5 (3) MG/3ML SOLN Take 3 mLs by nebulization every 4 (four) hours as needed (  shortness of breath).   Yes Historical Provider, MD  Liniments ([employer] ARTHRITIS PAIN RELIEVER EX) Apply 1 application topically daily as needed (for shoulder pains).   Yes Historical Provider, MD  metFORMIN (GLUCOPHAGE) 500 MG tablet Take 500 mg by mouth 2 (two) times daily with a meal.   Yes Historical Provider, MD  Multiple Vitamins-Minerals (ICAPS) CAPS Take 1 capsule by mouth daily.   Yes Historical Provider, MD  naproxen sodium (ANAPROX) 220 MG tablet Take 220 mg by mouth daily as needed (for pain).    Yes Historical Provider, MD  nitrofurantoin, macrocrystal-monohydrate, (MACROBID) 100 MG capsule Take 100 mg by mouth 2 (two) times daily. Started on 04/21/14  For 30 days   Yes Historical Provider, MD  predniSONE (DELTASONE) 5 MG tablet Take 5 mg by mouth every Monday, Wednesday, and Friday.    Yes Historical Provider, MD  ranitidine (ZANTAC) 150 MG capsule Take 150 mg by mouth at bedtime.  02/20/14  Yes Historical Provider, MD  RESTASIS 0.05 % ophthalmic emulsion Place 1  drop into both eyes 2 (two) times daily.  03/07/14  Yes Historical Provider, MD  simethicone (MYLICON) 80 MG chewable tablet Chew 1 tablet (80 mg total) by mouth 4 (four) times daily as needed for flatulence. 12/04/13  Yes Robbie Lis, MD  ticagrelor (BRILINTA) 90 MG TABS tablet Take 1 tablet (90 mg total) by mouth 2 (two) times daily. 04/21/14  Yes Lendon Colonel, NP  tiotropium (SPIRIVA) 18 MCG inhalation capsule Place 1 capsule (18 mcg total) into inhaler and inhale daily. 12/04/13  Yes Robbie Lis, MD  Travoprost, BAK Free, (TRAVATAMN) 0.004 % SOLN ophthalmic solution Place 1 drop into both eyes at bedtime.    Yes Historical Provider, MD  carvedilol (COREG) 3.125 MG tablet Take 1 tablet (3.125 mg total) by mouth 2 (two) times daily with a meal. 04/21/14   Lendon Colonel, NP     Allergies:  Allergies  Allergen Reactions  . Amlodipine Besy-Benazepril Hcl Other (See Comments)    Nervous/shakiness  . Statins Other (See Comments)    "can't take any of them; cramp me up" (12/06/2012)  . Tradjenta [Linagliptin] Other (See Comments)    Extreme muscle pains, chest and back pains  . Plavix [Clopidogrel] Rash    Severe burning of the skin.Pt states that the doctor told her that her rash was psoriasis and not related to plavix but she states the rash came from the plavix  . Raptiva [Efalizumab] Rash    Social History:   reports that she has quit smoking. Her smoking use included Cigarettes. She has a 40 pack-year smoking history. She quit smokeless tobacco use about 7 months ago. She reports that she does not drink alcohol or use illicit drugs.  Family History: Family History  Problem Relation Age of Onset  . Lupus Sister     x2  . Lung cancer Sister   . Thyroid disease Sister   . Colon cancer Neg Hx   . Cancer Mother   . Heart Problems Maternal Grandmother   . Cancer Paternal Grandfather   . Diabetes Brother     x2  . Arthritis Brother     x3  . Arthritis Sister     x3      Physical Exam: Filed Vitals:   05/09/14 1700 05/09/14 1715 05/09/14 1730 05/09/14 1745  BP: 128/54 117/49 138/53 120/60  Pulse: 72 79 74 72  Temp:      TempSrc:  Resp: 33 29 26 28   Height:      Weight:      SpO2: 100% 100% 100% 100%   Blood pressure 120/60, pulse 72, temperature 98.6 F (37 C), temperature source Oral, resp. rate 28, height 5\' 4"  (1.626 m), weight 71.442 kg (157 lb 8 oz), SpO2 100.00%.  GEN:  Pleasant  patient lying in the stretcher in no acute distress; cooperative with exam. PSYCH:  alert and oriented x4; does not appear anxious or depressed; affect is appropriate. HEENT: Mucous membranes pink and anicteric; PERRLA; EOM intact; no cervical lymphadenopathy nor thyromegaly or carotid bruit; no JVD; There were no stridor. Neck is very supple. Breasts:: Not examined CHEST WALL: No tenderness CHEST: Normal respiration, clear to auscultation bilaterally.  HEART: Regular rate and rhythm.  There are no murmur, rub, or gallops.   BACK: No kyphosis or scoliosis; no CVA tenderness ABDOMEN: soft and non-tender; no masses, no organomegaly, normal abdominal bowel sounds; no pannus; no intertriginous candida. There is no rebound and no distention. Rectal Exam: Not done EXTREMITIES: No bone or joint deformity; age-appropriate arthropathy of the hands and knees; no edema; no ulcerations.  There is no calf tenderness. Genitalia: not examined PULSES: 2+ and symmetric SKIN: Normal hydration no rash or ulceration CNS: Cranial nerves 2-12 grossly intact no focal lateralizing neurologic deficit.  Speech is fluent; uvula elevated with phonation, facial symmetry and tongue midline. DTR are normal bilaterally, cerebella exam is intact, barbinski is negative and strengths are equaled bilaterally.  No sensory loss.   Labs on Admission:  Basic Metabolic Panel:  Recent Labs Lab 05/09/14 1320  NA 140  K 4.4  CL 100  CO2 27  GLUCOSE 303*  BUN 12  CREATININE 0.99  CALCIUM  9.7   Liver Function Tests:  Recent Labs Lab 05/09/14 1320  AST 15  ALT 14  ALKPHOS 100  BILITOT 0.2*  PROT 6.7  ALBUMIN 3.4*   No results found for this basename: LIPASE, AMYLASE,  in the last 168 hours No results found for this basename: AMMONIA,  in the last 168 hours CBC:  Recent Labs Lab 05/09/14 1320  WBC 7.7  NEUTROABS 6.6  HGB 7.2*  HCT 24.2*  MCV 85.8  PLT 232   Cardiac Enzymes: No results found for this basename: CKTOTAL, CKMB, CKMBINDEX, TROPONINI,  in the last 168 hours  CBG: No results found for this basename: GLUCAP,  in the last 168 hours   Radiological Exams on Admission: Dg Chest 2 View  05/09/2014   CLINICAL DATA:  Shortness of breath, weakness, abdominal distention and soreness, recent MI and coronary stenting, past history COPD, hypertension, asthma, cirrhosis, diabetes, smoking  EXAM: CHEST  2 VIEW  COMPARISON:  04/14/2014  FINDINGS: Enlargement of cardiac silhouette.  Tortuous aorta with atherosclerotic calcification.  Pulmonary vascular congestion.  Bibasilar atelectasis.  No acute infiltrate, pleural effusion, or pneumothorax.  Bones demineralized.  IMPRESSION: Enlargement of cardiac silhouette with bibasilar atelectasis.   Electronically Signed   By: Lavonia Dana M.D.   On: 05/09/2014 14:41    Assessment/Plan Present on Admission:  . ANEMIA, IRON DEFICIENCY . GI bleed . CAD (coronary artery disease), 04/16/14 Impala insertion IVUS, rotational atherectomy of the LAD, angioplasty and stenting of the proximal and mid LAD with Xience Alpine drug-eluting stent . Cardiomyopathy, ischemic, EF 35-40% . Hyperlipidemia LDL goal < 70 . HYPERTENSION . GERD . DIABETES-TYPE 2  PLAN:  Will admit and transfuse 2 units of PRBCs.  I will give IV Lasix  before each.  She has a number of possibility for her anemia, including feSO4 malabsorption, chronic disease, lower GI malignancy. Will obtain anemia panel.  She will need follow up with GI for the virtual  colonoscopy. For her DM, will continue her meds, place on carb  Modified diet, and use moderate SSI She is stable, full code, and will be admitted to telemetry for transfusion.  .  Thank you for letting me participate in her care.  Other plans as per orders.  Code Status: FULL Haskel Khan, MD. [facility] Pager [phone] 7pm to 7am.  05/09/2014, 7:02 PM

## 2014-05-09 NOTE — Progress Notes (Signed)
     05/09/2014 [name] [id number] [birth year]   History of Present Illness:  This is a 73 y.o. female with multiple significant medical problems. She uses oxygen for her COPD prn. Dr. Henrene Pastor saw her in the hospital 03/17/2014 when she underwent upper endoscopy to evaluate anemia (iron deficiency) and Hemoccult-positive stool. The examination was normal. The patient did have prior colonoscopy in 2007 which was negative for neoplasia that examination was performed by Dr. Verl Blalock. She was noted to have hemorrhoids and diverticulosis. The patient was discharged from the hospital on iron sulfate twice daily. She has been compliant.  She does have a history of GERD, Nexium is effective.  She was seen by Dr. Henrene Pastor in the office on 5/6 and was scheduled for a colonoscopy on 5/20, but that same day she was hospitalized and underwent PCI with stent placement. She is now on Brilinta.  Her most recent Hgb, checked by her PCP this week was 7.6 grams, according to her report (says that she was just given the result via phone yesterday).  Hgb on 6/1 was 8.8 grams, and on 5/23 was 9.4 grams.  She says that recently she performed two hemoccults that were negative.  She comes in today because of the anemia.  She says that she does not think that she can make it through the weekend due to fatigue and weakness.  Her other chronic complaints include bloating and gas.   Current Medications, Allergies, Past Medical History, Past Surgical History, Family History and Social History were reviewed in [employer] record.   Physical Exam: BP 110/60  Pulse 80  Ht 5\' 4"  (1.626 m)  Wt 160 lb 12.8 oz (72.938 kg)  BMI 27.59 kg/m2 General:  Elderly black female in no acute distress Head: Normocephalic and atraumatic Eyes:  Sclerae anicteric, conjunctiva pale  Ears: Normal auditory acuity Lungs: Clear throughout to auscultation Heart: Regular rate and rhythm Abdomen: Soft, non-distended.   Normal bowel sounds.  Mild diffuse TTP without R/R/G. Musculoskeletal: Symmetrical with no gross deformities  Extremities: No edema  Neurological: Alert oriented x 4, grossly non-focal Psychological:  Alert and cooperative. Normal mood and affect  Assessment and Recommendations: -Anemia (iron deficiency) with Hgb trending down.  Hgb 7.6 grams this week.  Was heme negative previously but she reports two heme negative stools this week.  Now on Brilinta after PCI with placement of drug eluting stents.  Complaining of fatigue, weakness, and SOB.  Dr. Henrene Pastor saw the patient and spoke with her today as well.  We have asked her to go to the emergency department for a recheck hemoglobin and likely at least 24-hour observation admission to receive blood transfusion. From our standpoint she is not a good candidate for colonoscopy currently so we will schedule a virtual colonoscopy.

## 2014-05-09 NOTE — Progress Notes (Signed)
Patient personally seen and examined with the advanced practitioner. Principal problem today is weakness and difficulty breathing. Apparently had a hemoglobin of 7.6 from her PCP yesterday. No action taken. No evidence for GI bleeding. Cannot attributed her anemia recurrence to GI bleeding. Prior endoscopy unrevealing. Plan is to evaluate the colon with virtual colonoscopy (too high risk for optical colonoscopy). She needs to go to the hospital for evaluation, probable admission and transfusion. Hematology consultation may be helpful given the degree of recurrent anemia without obvious bleeding

## 2014-05-10 ENCOUNTER — Inpatient Hospital Stay (HOSPITAL_COMMUNITY): Payer: Medicare Other

## 2014-05-10 DIAGNOSIS — D649 Anemia, unspecified: Secondary | ICD-10-CM

## 2014-05-10 LAB — CBC
HCT: 32.5 % — ABNORMAL LOW (ref 36.0–46.0)
Hemoglobin: 9.9 g/dL — ABNORMAL LOW (ref 12.0–15.0)
MCH: 25.8 pg — AB (ref 26.0–34.0)
MCHC: 30.5 g/dL (ref 30.0–36.0)
MCV: 84.6 fL (ref 78.0–100.0)
PLATELETS: 219 10*3/uL (ref 150–400)
RBC: 3.84 MIL/uL — ABNORMAL LOW (ref 3.87–5.11)
RDW: 18.3 % — AB (ref 11.5–15.5)
WBC: 7.8 10*3/uL (ref 4.0–10.5)

## 2014-05-10 LAB — LACTATE DEHYDROGENASE: LDH: 194 U/L (ref 94–250)

## 2014-05-10 LAB — SAVE SMEAR

## 2014-05-10 LAB — IRON AND TIBC
Iron: 19 ug/dL — ABNORMAL LOW (ref 42–135)
SATURATION RATIOS: 6 % — AB (ref 20–55)
TIBC: 328 ug/dL (ref 250–470)
UIBC: 309 ug/dL (ref 125–400)

## 2014-05-10 LAB — GLUCOSE, CAPILLARY
GLUCOSE-CAPILLARY: 204 mg/dL — AB (ref 70–99)
GLUCOSE-CAPILLARY: 176 mg/dL — AB (ref 70–99)
Glucose-Capillary: 150 mg/dL — ABNORMAL HIGH (ref 70–99)
Glucose-Capillary: 144 mg/dL — ABNORMAL HIGH (ref 70–99)

## 2014-05-10 LAB — FERRITIN: Ferritin: 27 ng/mL (ref 10–291)

## 2014-05-10 LAB — FOLATE: Folate: 11.1 ng/mL

## 2014-05-10 LAB — VITAMIN B12: Vitamin B-12: 990 pg/mL — ABNORMAL HIGH (ref 211–911)

## 2014-05-10 NOTE — Progress Notes (Signed)
Nutrition Brief Note  Patient identified on the Malnutrition Screening Tool (MST) Report  Wt Readings from Last 15 Encounters:  05/10/14 156 lb 11.2 oz (71.079 kg)  05/09/14 160 lb 12.8 oz (72.938 kg)  04/28/14 159 lb 9.6 oz (72.394 kg)  04/21/14 159 lb 2.8 oz (72.2 kg)  04/21/14 159 lb 2.8 oz (72.2 kg)  04/21/14 159 lb 2.8 oz (72.2 kg)  04/02/14 173 lb 4.8 oz (78.608 kg)  03/21/14 173 lb 3.2 oz (78.563 kg)  03/21/14 173 lb 3.2 oz (78.563 kg)  12/31/13 170 lb (77.111 kg)  12/04/13 167 lb 14.4 oz (76.159 kg)  08/12/13 177 lb (80.287 kg)  07/30/13 177 lb (80.287 kg)  02/13/13 164 lb 8 oz (74.617 kg)  01/11/13 167 lb 1.7 oz (75.8 kg)    Body mass index is 26.88 kg/(m^2). Patient meets criteria for Overweight based on current BMI. Pt reports usual body weight of 175 lbs.  Current diet order is Carb Modified, patient is consuming approximately 75-100% of meals at this time. Pt reports that she has had issues with abdomina pain and bloating. She started eating less and losing weight so her doctor recommended drinking Glucerna Shake 1-2 times daily. Pt reports she has been eating fairly well and drinking Glucerna Shake once daily. Pt states she is being discharged today. Encouraged pt to continue drinking one shke daily, if she continues to lose weight she should increase to 2 shakes daily. Labs and medications reviewed.   No additional nutrition interventions warranted at this time. If pt is not discharged today, RD will continue to monitor nutrition care plan.  Pryor Ochoa RD, LDN Inpatient Clinical Dietitian Pager: [phone] After Hours Pager: [phone]

## 2014-05-10 NOTE — Plan of Care (Signed)
Problem: Phase I Progression Outcomes Goal: Hemodynamically stable Outcome: Progressing Patient has remained hemodynamically stable this shift.  VSS.  She is s/p 2 units of PRBCs.  She tolerated the transfusions well without reaction.

## 2014-05-10 NOTE — Progress Notes (Signed)
[facility] PROGRESS NOTE  [name] [id number]:[id number] DOB: 01/20/1941 DOA: 05/09/2014 PCP: Leola Brazil, MD Interim summary: [name] is an 73 year old woman sent in from Dr Henrene Pastor office for symptomatic anemia for transfusion. She has hx of congestive heart failure over the last several months. She's had non-ST elevation infarction and has been found to have severe proximal LAD stenosis, ulcerated stenosis of the left mainstem, and involvement of the proximal left circumflex. She also has hx of anemia, and had negative upper endoscopy, and planned to have virtual colonoscopy as she has too much risk for optical colonoscopy. She had a Hb of 7.6 grams and felt weak and shortness of breath, so she was sent in for transfusion. Dr Henrene Pastor was not convinced her anemia is from GI bleeding, but her lower GI hadn't yet to be fully evaluated. Work up in the ER showed Hb of 7.2, with normal Cr, and BUN of 12. Her MCV was 85 with normal platelet count. Hospitalist was asked to admit her for the aforementioned reason. She received 2 units of prbc and her H&h improved to 9.9.   Assessment/Plan: 1. Anemia with heme positive stools: - s/p 2units of prbc transfusion. Improvement in the hemoglobin. Plan for virtual colonoscopy on 6/23. Will order LDh and smear to see if hemolysis is taking place. Will need outpatient hematology follow up . Anemia panel .   2. COPD: - nowheezing. Good oxygen sats. On 2 lit [place] oxygen.   3. Hypertension. - better controlled.   4. CAD : Denies any chest pain or sob. Resume home medications.   5. Diabetes Mellitus: SSI.   6. Chronic systolic heart failure: - appears to be compensated.   7. DVT prophylaxis.   Code Status: full code presumed.  Family Communication:none atb edside Disposition Plan: pending. Possible tomorrow.    Consultants:  none  Procedures:  abd film.   Antibiotics:  none  HPI/Subjective: TEARY eyed wants to go  home. Reports some; bloating sensation in her abdomen. Also reports her breathing is btter.  Objective: Filed Vitals:   05/10/14 1343  BP: 119/59  Pulse: 74  Temp: 98.3 F (36.8 C)  Resp: 20    Intake/Output Summary (Last 24 hours) at 05/10/14 1412 Last data filed at 05/10/14 1343  Gross per 24 hour  Intake 1373.75 ml  Output   1700 ml  Net -326.25 ml   Filed Weights   05/09/14 2109 05/09/14 2114 05/10/14 0554  Weight: 72.077 kg (158 lb 14.4 oz) 72.077 kg (158 lb 14.4 oz) 71.079 kg (156 lb 11.2 oz)    Exam:   General:  ALERT afebrile comfortable  Cardiovascular: s1s2  Respiratory: ctab  Abdomen: soft NT ND BS+   Data Reviewed: Basic Metabolic Panel:  Recent Labs Lab 05/09/14 1320  NA 140  K 4.4  CL 100  CO2 27  GLUCOSE 303*  BUN 12  CREATININE 0.99  CALCIUM 9.7   Liver Function Tests:  Recent Labs Lab 05/09/14 1320  AST 15  ALT 14  ALKPHOS 100  BILITOT 0.2*  PROT 6.7  ALBUMIN 3.4*   No results found for this basename: LIPASE, AMYLASE,  in the last 168 hours No results found for this basename: AMMONIA,  in the last 168 hours CBC:  Recent Labs Lab 05/09/14 1320 05/10/14 0900  WBC 7.7 7.8  NEUTROABS 6.6  --   HGB 7.2* 9.9*  HCT 24.2* 32.5*  MCV 85.8 84.6  PLT 232 219   Cardiac Enzymes:  No results found for this basename: CKTOTAL, CKMB, CKMBINDEX, TROPONINI,  in the last 168 hours BNP (last 3 results)  Recent Labs  04/08/14 1140 04/14/14 0425 05/09/14 1319  PROBNP 21317.0* 8743.0* 8343.0*   CBG:  Recent Labs Lab 05/09/14 1942 05/09/14 2154 05/10/14 0605 05/10/14 1112  GLUCAP 144* 286* 150* 204*    No results found for this or any previous visit (from the past 240 hour(s)).   Studies: Dg Chest 2 View  05/09/2014   CLINICAL DATA:  Shortness of breath, weakness, abdominal distention and soreness, recent MI and coronary stenting, past history COPD, hypertension, asthma, cirrhosis, diabetes, smoking  EXAM: CHEST  2 VIEW   COMPARISON:  04/14/2014  FINDINGS: Enlargement of cardiac silhouette.  Tortuous aorta with atherosclerotic calcification.  Pulmonary vascular congestion.  Bibasilar atelectasis.  No acute infiltrate, pleural effusion, or pneumothorax.  Bones demineralized.  IMPRESSION: Enlargement of cardiac silhouette with bibasilar atelectasis.   Electronically Signed   By: Lavonia Dana M.D.   On: 05/09/2014 14:41    Scheduled Meds: . albuterol  2.5 mg Nebulization QID  . aspirin EC  81 mg Oral Daily  . budesonide-formoterol  2 puff Inhalation QPM  . carvedilol  3.125 mg Oral BID WC  . cycloSPORINE  1 drop Both Eyes BID  . famotidine  20 mg Oral BID  . feeding supplement (GLUCERNA SHAKE)  237 mL Oral Daily  . ferrous sulfate  325 mg Oral BID WC  . furosemide  20 mg Oral Daily  . insulin aspart  0-15 Units Subcutaneous TID WC  . insulin aspart  0-5 Units Subcutaneous QHS  . latanoprost  1 drop Both Eyes QHS  . metFORMIN  500 mg Oral BID WC  . nitrofurantoin (macrocrystal-monohydrate)  100 mg Oral BID  . [START ON 05/12/2014] predniSONE  5 mg Oral Q M,W,F  . ticagrelor  90 mg Oral BID  . tiotropium  18 mcg Inhalation Daily   Continuous Infusions:   Principal Problem:   ANEMIA, IRON DEFICIENCY Active Problems:   DIABETES-TYPE 2   HYPERTENSION   GERD   GI bleed   CAD (coronary artery disease), 04/16/14 Impala insertion IVUS, rotational atherectomy of the LAD, angioplasty and stenting of the proximal and mid LAD with Xience Alpine drug-eluting stent   Cardiomyopathy, ischemic, EF 35-40%   Hyperlipidemia LDL goal < 70   Symptomatic anemia    Time spent: [place]  [facility] Pager [phone] If 7PM-7AM, please contact night-coverage at [email or web address], password [facility] 05/10/2014, 2:12 PM  LOS: 1 day

## 2014-05-11 DIAGNOSIS — D509 Iron deficiency anemia, unspecified: Secondary | ICD-10-CM

## 2014-05-11 LAB — GLUCOSE, CAPILLARY
Glucose-Capillary: 161 mg/dL — ABNORMAL HIGH (ref 70–99)
Glucose-Capillary: 227 mg/dL — ABNORMAL HIGH (ref 70–99)
Glucose-Capillary: 106 mg/dL — ABNORMAL HIGH (ref 70–99)

## 2014-05-11 LAB — CBC
HEMATOCRIT: 32.2 % — AB (ref 36.0–46.0)
HEMOGLOBIN: 9.9 g/dL — AB (ref 12.0–15.0)
MCH: 26.1 pg (ref 26.0–34.0)
MCHC: 30.7 g/dL (ref 30.0–36.0)
MCV: 85 fL (ref 78.0–100.0)
Platelets: 221 10*3/uL (ref 150–400)
RBC: 3.79 MIL/uL — ABNORMAL LOW (ref 3.87–5.11)
RDW: 18.4 % — ABNORMAL HIGH (ref 11.5–15.5)
WBC: 8.8 10*3/uL (ref 4.0–10.5)

## 2014-05-11 LAB — TYPE AND SCREEN
ABO/RH(D): A NEG
ANTIBODY SCREEN: NEGATIVE
UNIT DIVISION: 0
Unit division: 0

## 2014-05-11 NOTE — Progress Notes (Signed)
PROGRESS NOTE  [name] ZOX:[id number] DOB: 1941/09/19 DOA: 05/09/2014 PCP: Leola Brazil, MD  Summary: 73 year old woman sent from GI office Dr. Henrene Pastor for transfusion for symptomatic anemia  Patient with extensive medical problems with recent hospitalizations for respiratory failure, anemia from GI bleed, MI preceding 2 drug-eluting stents has been home approximately 2 weeks and presents today with 2 days of worsening shortness of breath. She has a nonproductive cough denies fever. She saw her gastroenterologist today who sent her here for repeat hemoglobin for further evaluation. Patient's hemoglobin upon leaving the hospital with 8 after transfusion. She's had non-ST elevation infarction and has been found to have severe proximal LAD stenosis, ulcerated stenosis of the left mainstem, and involvement of the proximal left circumflex. She also has hx of anemia, and had negative upper endoscopy, and planned to have virtual colonoscopy as she has too much risk for optical colonoscopy. She had a Hb of 7.6 grams and felt weak and shortness of breath, so she was sent in for transfusion. Dr Henrene Pastor was not convinced her anemia is from GI bleeding, but her lower GI hadn't yet to be fully evaluated. She was admitted for transfusion.  Assessment/Plan: 1. Iron deficiency anemia, unremarkable EGD 02/2014. Plan for virtual colonoscopy on 6/23. LDH within normal limits, peripheral smear unremarkable, no evidence of hemolysis. Remains iron deficient by iron studies. She will need outpatient followup with GI. Suspect chronic GI blood loss. Trial of oral iron but if this fails consider IV iron. Stop NSAIDs. 2. COPD on home oxygen as needed. Appears to be stable. 3. Generalized weakness. Appears resolved after transfusion. 4. DM type 2. Controlled. 5. Ischemic cardiomyopathy with left ventricular ejection fraction 35%. Appears well compensated. 6. CAD s/p STEMI and complex, high risk PCI to proximal-mid  LAD 03/2014. On monotherapy with Brilinta after PCI with placement of drug eluting stents   Plan discharge home today with outpatient followup in one week, check CBC at that time. She also already has f/u with Dr. Gwenlyn Found scheduled for tomorrow. Patient reports she was instructed to take ASA by Dr. Gwenlyn Found. Can't stop Brilinta with recent stent placement.  Oral iron. If fails to improve, consider IV iron infusion.  Could consider outpatient hematology evaluation, however her history and laboratory studies suggest heart deficiency.  Consideration should be given to discontinue metformin given her systolic heart failure but this will be deferred to her outpatient physician.  Discontinue NSAIDs.  Murray Hodgkins, MD  [facility]  Pager [phone] If 7PM-7AM, please contact night-coverage at [email or web address], password [facility] 05/11/2014, 11:22 AM  LOS: 2 days   Consultants:    Procedures:  Transfusion 2 units packed red blood cells  Antibiotics:    HPI/Subjective: Feeling better, breathing better, moving better. Wants to go home.  Objective: Filed Vitals:   05/11/14 0515 05/11/14 0758 05/11/14 0823 05/11/14 1029  BP: 150/64   121/61  Pulse: 76   88  Temp: 98.6 F (37 C)     TempSrc: Oral     Resp: 18     Height:      Weight: 70.897 kg (156 lb 4.8 oz)     SpO2: 100% 97% 97%     Intake/Output Summary (Last 24 hours) at 05/11/14 1122 Last data filed at 05/11/14 0900  Gross per 24 hour  Intake    480 ml  Output    200 ml  Net    280 ml     Filed Weights   05/09/14 2114 05/10/14 0554 05/11/14  0515  Weight: 72.077 kg (158 lb 14.4 oz) 71.079 kg (156 lb 11.2 oz) 70.897 kg (156 lb 4.8 oz)    Exam:   Afebrile, vital signs are stable. No hypoxia. Gen. Appears calm and comfortable. Ambulating in room without difficulty. Psych. Alert. Grossly normal mood and affect. Speech fluent and appropriate. Cardiovascular. Regular rate and rhythm. No murmur, or gallop. Telemetry  sinus rhythm. Respiratory. Clear to auscultation bilaterally. No wheezes, rales or rhonchi. Normal respiratory effort.  Data Reviewed: Chemistry: Capillary blood sugars stable. Basic metabolic panel unremarkable on admission. Heme: Hemoglobin 7.2 on admission. 9.9 after transfusion, stable. LDH within normal limits. Iron 19, ferritin 27.  Scheduled Meds: . albuterol  2.5 mg Nebulization QID  . aspirin EC  81 mg Oral Daily  . budesonide-formoterol  2 puff Inhalation QPM  . carvedilol  3.125 mg Oral BID WC  . cycloSPORINE  1 drop Both Eyes BID  . famotidine  20 mg Oral BID  . feeding supplement (GLUCERNA SHAKE)  237 mL Oral Daily  . ferrous sulfate  325 mg Oral BID WC  . furosemide  20 mg Oral Daily  . insulin aspart  0-15 Units Subcutaneous TID WC  . insulin aspart  0-5 Units Subcutaneous QHS  . latanoprost  1 drop Both Eyes QHS  . metFORMIN  500 mg Oral BID WC  . nitrofurantoin (macrocrystal-monohydrate)  100 mg Oral BID  . [START ON 05/12/2014] predniSONE  5 mg Oral Q M,W,F  . ticagrelor  90 mg Oral BID  . tiotropium  18 mcg Inhalation Daily   Continuous Infusions:   Principal Problem:   ANEMIA, IRON DEFICIENCY Active Problems:   DIABETES-TYPE 2   HYPERTENSION   GERD   GI bleed   CAD (coronary artery disease), 04/16/14 Impala insertion IVUS, rotational atherectomy of the LAD, angioplasty and stenting of the proximal and mid LAD with Xience Alpine drug-eluting stent   Cardiomyopathy, ischemic, EF 35-40%   Hyperlipidemia LDL goal < 70   Symptomatic anemia

## 2014-05-11 NOTE — Discharge Summary (Signed)
Physician Discharge Summary  [name] [id number]:[id number] DOB: Feb 05, 1941 DOA: 05/09/2014  PCP: Leola Brazil, MD  Admit date: 05/09/2014 Discharge date: 05/11/2014  Recommendations for Outpatient Follow-up:  1. Iron deficiency anemia, etiology unclear but consider chronic GI loss, may simply be anemia of chronic disease compounded by subacute illness, NSAIDs, as well as necessary antiplatelet therapy. Review of records is notable for baseline Hgb 7-10 since 11/2012, low iron levels seen 12/2012 as well. 2. Continue oral oral iron, consider repeat CBC in one week, consider IV iron infusion if hemoglobin does not respond. 3. Consider alternative therapy to metformin given her ischemic cardiomyopathy.  4. Recommend no NSAIDs.   Follow-up Information   Follow up with [facility] JR,[name] R, MD. Schedule an appointment as soon as possible for a visit in 1 week.   Specialty:  Pulmonary Disease   Contact information:   [place] [address] [phone]       Follow up with Scarlette Shorts, MD. (as directed by his office)    Specialty:  Gastroenterology   Contact information:   [address] [place] [address] [phone]      Discharge Diagnoses:  1. Iron deficiency anemia. 2. Suspect chronic GI blood loss 3. COPD on home oxygen, stable 4. Diabetes mellitus type 2 5. Ischemic cardiomyopathy 6. History coronary artery disease  Discharge Condition: Improved  Disposition: Home  Diet recommendation: Heart healthy diabetic diet  Filed Weights   05/09/14 2114 05/10/14 0554 05/11/14 0515  Weight: 72.077 kg (158 lb 14.4 oz) 71.079 kg (156 lb 11.2 oz) 70.897 kg (156 lb 4.8 oz)    History of present illness:  73 year old woman sent from GI office Dr. Henrene Pastor for transfusion for symptomatic anemia .  Patient with extensive medical problems with recent hospitalizations for respiratory failure, anemia from GI bleed, MI preceding 2 drug-eluting stents  has been home approximately 2 weeks and presents with 2 days of worsening shortness of breath. She has a nonproductive cough denies fever. She saw her gastroenterologist t who sent her to the hospital for repeat hemoglobin for further evaluation. Patient's hemoglobin upon leaving the hospital with 8 after transfusion. She's had non-ST elevation infarction and has been found to have severe proximal LAD stenosis, ulcerated stenosis of the left mainstem, and involvement of the proximal left circumflex. She also has hx of anemia, and had negative upper endoscopy, and planned to have virtual colonoscopy as she has too much risk for optical colonoscopy. She had a Hb of 7.6 grams and felt weak and shortness of breath, so she was sent in for transfusion. Dr Henrene Pastor was not convinced her anemia is from GI bleeding, but her lower GI hadn't yet to be fully evaluated. She was admitted for transfusion.  Hospital Course:  Patient was admitted, received transfusion 2 units packed red blood cells with appropriate response. Followup hemoglobin demonstrates stability. She is ambulating without difficulty with like to go home. She has no cardiac or pulmonary symptoms of significance. Individual issues as below.  1. Iron deficiency anemia, unremarkable EGD 02/2014. Plan for virtual colonoscopy on 6/23. LDH within normal limits, peripheral smear unremarkable, no evidence of hemolysis. She will need outpatient followup with GI. Consider chronic GI blood loss. Trial of oral iron but if this fails consider IV iron. Stop NSAIDs. 2. COPD on home oxygen as needed. Appears to be stable. 3. Generalized weakness. Appears resolved after transfusion. 4. DM type 2. Controlled. 5. Ischemic cardiomyopathy with left ventricular ejection fraction 35%.  Appears well compensated. 6. CAD s/p STEMI and complex, high risk PCI to proximal-mid LAD 03/2014. On monotherapy with Brilinta after PCI with placement of drug eluting stents Plan discharge home  today with outpatient followup in one week, check CBC at that time. She also already has f/u with Dr. Gwenlyn Found scheduled for tomorrow. Patient reports she was instructed to take ASA by Dr. Gwenlyn Found. Can't stop Brilinta with recent stent placement.  Oral iron. If fails to improve, consider IV iron infusion.  Could consider outpatient hematology evaluation, however her history and laboratory studies suggest iron deficiency.  Consideration should be given to discontinuing metformin given her systolic heart failure but this will be deferred to her outpatient physician.  Discontinue NSAIDs.  Consultants: none Procedures:  Transfusion 2 units packed red blood cells  Discharge Instructions  Discharge Instructions   Diet - low sodium heart healthy    Complete by:  As directed      Diet Carb Modified    Complete by:  As directed      Discharge instructions    Complete by:  As directed   Call physician or seek any medical attention for bleeding, shortness of breath, chest pain, fatigue or worsening of condition.     Increase activity slowly    Complete by:  As directed             Medication List    STOP taking these medications       naproxen sodium 220 MG tablet  Commonly known as:  ANAPROX      TAKE these medications       albuterol 108 (90 BASE) MCG/ACT inhaler  Commonly known as:  PROVENTIL HFA;VENTOLIN HFA  Inhale 1 puff into the lungs every 6 (six) hours as needed. FOR SHORTNESS OF BREATH     ALPRAZolam 0.5 MG tablet  Commonly known as:  XANAX  Take 0.25 mg by mouth 2 (two) times daily as needed for anxiety or sleep.     ammonium lactate 12 % lotion  Commonly known as:  LAC-HYDRIN  Apply 1 application topically daily as needed for dry skin (psorias).     aspirin 81 MG tablet  Take 81 mg by mouth daily.     budesonide-formoterol 160-4.5 MCG/ACT inhaler  Commonly known as:  SYMBICORT  Inhale 2 puffs into the lungs every evening.     carvedilol 3.125 MG tablet  Commonly  known as:  COREG  Take 1 tablet (3.125 mg total) by mouth 2 (two) times daily with a meal.     [employer] ARTHRITIS PAIN RELIEVER EX  Apply 1 application topically daily as needed (for shoulder pains).     esomeprazole 40 MG capsule  Commonly known as:  NEXIUM  Take 40 mg by mouth daily before breakfast.     feeding supplement (GLUCERNA SHAKE) Liqd  Take 237 mLs by mouth daily.     ferrous sulfate 325 (65 FE) MG tablet  Take 1 tablet (325 mg total) by mouth 2 (two) times daily with a meal.     fluticasone 50 MCG/ACT nasal spray  Commonly known as:  FLONASE  Place 1 spray into both nostrils at bedtime as needed for allergies.     furosemide 40 MG tablet  Commonly known as:  LASIX  Take 20 mg by mouth daily.     ICAPS Caps  Take 1 capsule by mouth daily.     ipratropium-albuterol 0.5-2.5 (3) MG/3ML Soln  Commonly known as:  DUONEB  Take 3  mLs by nebulization every 4 (four) hours as needed (shortness of breath).     metFORMIN 500 MG tablet  Commonly known as:  GLUCOPHAGE  Take 500 mg by mouth 2 (two) times daily with a meal.     nitrofurantoin (macrocrystal-monohydrate) 100 MG capsule  Commonly known as:  MACROBID  Take 100 mg by mouth 2 (two) times daily. Started on 04/21/14  For 30 days     predniSONE 5 MG tablet  Commonly known as:  DELTASONE  Take 5 mg by mouth every Monday, Wednesday, and Friday.     ranitidine 150 MG capsule  Commonly known as:  ZANTAC  Take 150 mg by mouth at bedtime.     RESTASIS 0.05 % ophthalmic emulsion  Generic drug:  cycloSPORINE  Place 1 drop into both eyes 2 (two) times daily.     SIMBRINZA 1-0.2 % Susp  Generic drug:  Brinzolamide-Brimonidine  Place 1 drop into the right eye 3 (three) times daily.     simethicone 80 MG chewable tablet  Commonly known as:  MYLICON  Chew 1 tablet (80 mg total) by mouth 4 (four) times daily as needed for flatulence.     ticagrelor 90 MG Tabs tablet  Commonly known as:  BRILINTA  Take 1 tablet (90 mg  total) by mouth 2 (two) times daily.     tiotropium 18 MCG inhalation capsule  Commonly known as:  SPIRIVA  Place 1 capsule (18 mcg total) into inhaler and inhale daily.     Travoprost (BAK Free) 0.004 % Soln ophthalmic solution  Commonly known as:  TRAVATAN  Place 1 drop into both eyes at bedtime.       Allergies  Allergen Reactions  . Amlodipine Besy-Benazepril Hcl Other (See Comments)    Nervous/shakiness  . Statins Other (See Comments)    "can't take any of them; cramp me up" (12/06/2012)  . Tradjenta [Linagliptin] Other (See Comments)    Extreme muscle pains, chest and back pains  . Plavix [Clopidogrel] Rash    Severe burning of the skin.Pt states that the doctor told her that her rash was psoriasis and not related to plavix but she states the rash came from the plavix  . Raptiva [Efalizumab] Rash    The results of significant diagnostics from this hospitalization (including imaging, microbiology, ancillary and laboratory) are listed below for reference.    Significant Diagnostic Studies: Dg Chest 2 View  05/09/2014   CLINICAL DATA:  Shortness of breath, weakness, abdominal distention and soreness, recent MI and coronary stenting, past history COPD, hypertension, asthma, cirrhosis, diabetes, smoking  EXAM: CHEST  2 VIEW  COMPARISON:  04/14/2014  FINDINGS: Enlargement of cardiac silhouette.  Tortuous aorta with atherosclerotic calcification.  Pulmonary vascular congestion.  Bibasilar atelectasis.  No acute infiltrate, pleural effusion, or pneumothorax.  Bones demineralized.  IMPRESSION: Enlargement of cardiac silhouette with bibasilar atelectasis.   Electronically Signed   By: Lavonia Dana M.D.   On: 05/09/2014 14:41   Dg Abd 2 Views  05/10/2014   CLINICAL DATA:  Pain.  EXAM: ABDOMEN - 2 VIEW  COMPARISON:  Ultrasound 04/08/2014.  FINDINGS: Soft tissue structures are unremarkable. The gas pattern is nonspecific. Aortoiliac atherosclerotic vascular calcification present. Degenerative  changes lumbar spine and both hips. Cardiomegaly.  IMPRESSION: 1. Nonspecific abdomen. 2. Atherosclerotic vascular disease. 3. Cardiomegaly. 4. Degenerative changes lumbar spine and both hips.   Electronically Signed   By: Marcello Moores  Register   On: 05/10/2014 16:47   Labs: Basic Metabolic Panel:  Recent Labs Lab 05/09/14 1320  NA 140  K 4.4  CL 100  CO2 27  GLUCOSE 303*  BUN 12  CREATININE 0.99  CALCIUM 9.7   Liver Function Tests:  Recent Labs Lab 05/09/14 1320  AST 15  ALT 14  ALKPHOS 100  BILITOT 0.2*  PROT 6.7  ALBUMIN 3.4*   CBC:  Recent Labs Lab 05/09/14 1320 05/10/14 0900 05/11/14 0438  WBC 7.7 7.8 8.8  NEUTROABS 6.6  --   --   HGB 7.2* 9.9* 9.9*  HCT 24.2* 32.5* 32.2*  MCV 85.8 84.6 85.0  PLT 232 219 221     Recent Labs  04/08/14 1140 04/14/14 0425 05/09/14 1319  PROBNP 21317.0* 8743.0* 8343.0*   CBG:  Recent Labs Lab 05/10/14 1644 05/10/14 2008 05/11/14 0628 05/11/14 0736 05/11/14 1143  GLUCAP 144* 176* 161* 227* 106*    Principal Problem:   ANEMIA, IRON DEFICIENCY Active Problems:   DIABETES-TYPE 2   HYPERTENSION   GERD   GI bleed   CAD (coronary artery disease), 04/16/14 Impala insertion IVUS, rotational atherectomy of the LAD, angioplasty and stenting of the proximal and mid LAD with Xience Alpine drug-eluting stent   Cardiomyopathy, ischemic, EF 35-40%   Hyperlipidemia LDL goal < 70   Symptomatic anemia   Time coordinating discharge: 35 minutes  Signed:  Murray Hodgkins, MD [facility] 05/11/2014, 12:37 PM

## 2014-05-11 NOTE — Progress Notes (Addendum)
DC IV, DC Tele, DC Home. Discharge instructions and home medications discussed with patient. Patient denied any questions or concerns at this time. Patient leaving unit via wheelchair and appears in no acute distress.  

## 2014-05-12 ENCOUNTER — Ambulatory Visit (INDEPENDENT_AMBULATORY_CARE_PROVIDER_SITE_OTHER): Payer: Medicare Other | Admitting: Cardiology

## 2014-05-12 ENCOUNTER — Encounter: Payer: Self-pay | Admitting: Cardiology

## 2014-05-12 ENCOUNTER — Telehealth: Payer: Self-pay | Admitting: Internal Medicine

## 2014-05-12 VITALS — BP 140/64 | HR 82 | Ht 64.0 in | Wt 158.0 lb

## 2014-05-12 DIAGNOSIS — I255 Ischemic cardiomyopathy: Secondary | ICD-10-CM

## 2014-05-12 DIAGNOSIS — D509 Iron deficiency anemia, unspecified: Secondary | ICD-10-CM

## 2014-05-12 DIAGNOSIS — I10 Essential (primary) hypertension: Secondary | ICD-10-CM

## 2014-05-12 DIAGNOSIS — I2589 Other forms of chronic ischemic heart disease: Secondary | ICD-10-CM

## 2014-05-12 DIAGNOSIS — E785 Hyperlipidemia, unspecified: Secondary | ICD-10-CM

## 2014-05-12 DIAGNOSIS — D62 Acute posthemorrhagic anemia: Secondary | ICD-10-CM

## 2014-05-12 NOTE — Telephone Encounter (Signed)
Pt received blood over the weekend at the hospital. Pt stayed a couple of days and Hgb stayed at 9.9. Pt wants to know if Dr. Henrene Pastor needs to see her for an OV prior to her virtual colon 05/20/14. Please advise.

## 2014-05-12 NOTE — Telephone Encounter (Signed)
No need for repeat office visit. Keep plans for virtual colonoscopy to evaluate the colon. If she has recurrent problems with breathing difficulties or weakness, she needs to see her PCP. If she were to develop overt GI bleeding (which she has not had), she should contact us.

## 2014-05-12 NOTE — Telephone Encounter (Signed)
Spoke with pt and she is aware.

## 2014-05-12 NOTE — Patient Instructions (Addendum)
If no one else draws CBC to check hgb then have done on Thursday.  Though if you develop symptoms of anemia please call.   I will check with Dr. Gwenlyn Found concerning the ASA .  But follow up with him as arranged.

## 2014-05-12 NOTE — Progress Notes (Signed)
05/15/2014   PCP: Leola Brazil, MD   Chief Complaint  Patient presents with  . Follow-up    pt. was in hospital over the weekend and got two unit from Dr. Henrene Pastor    Primary Cardiologist:Dr. Adora Fridge   HPI:  72 year-old female with a history of hypertension, combined chronic heart failure with EF 35% and grade 1 diastolic dysfunction, COPD, CAD, diabetes, perform atrial disease, pulmonary hypertension, GI bleed. 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 Recently hospitalized 04/05/2014 secondary to acute respiratory failure requiring intubation. During the hospitalization her peak troponin was > 20 she had pulmonary edema requiring IV diuresing. Echo was done EF is 35-40% she did undergo left heart cath 04/09/2014 with three-vessel disease involving proximal and mid LAD, severe distal left main, ostial circumflex and RCA. PCI was not feasible due to coronary anatomy. T CTS evaluated her for bypass grafting but she was found to be a poor surgical candidate. PCI was felt to be the safest intervention at that point. 04/16/2014 she underwent Impala insertion IVUS, rotational atherectomy of the LAD, angioplasty and stenting of the proximal and mid LAD with Xience Alpine drug-eluting stent. Postop was complicated by hypotension, cardiogenic shock requiring dopamine drip. She also had acute blood loss anemia requiring 2 units packed RBCs. The plan is to use Brilinta alone for her new stent for 12 months and then change to aspirin alone daily. She was mobilized by cardiac rehabilitation, continued to to improve and was finally discharged home 04/21/2014. I saw Pt for follow up on 04/28/14.  She had not started her Iron at that time.  She had also complained of some dark stools.  Labs were ordered and her H/H Had decreased from 9.4 to 8.8.  Arrangements were made for her to see GI which she did and was then admitted for transfusion of 2 units of packed  cells on 05/09/2014.  Plans are also new for virtual colonoscopy with camera.  Reviewing her meds she is on aspirin. Patient stated Dr. Gwenlyn Found told her to take it by review of that discharge from the hospital she was only on Brilinta.  Patient is adamant that she should be on I will contact Dr. Gwenlyn Found to clarify.  She denies chest pain or shortness of breath. Since she's got no blood or shortness of breath is improved.   Allergies  Allergen Reactions  . Amlodipine Besy-Benazepril Hcl Other (See Comments)    Nervous/shakiness  . Statins Other (See Comments)    "can't take any of them; cramp me up" (12/06/2012)  . Tradjenta [Linagliptin] Other (See Comments)    Extreme muscle pains, chest and back pains  . Plavix [Clopidogrel] Rash    Severe burning of the skin.Pt states that the doctor told her that her rash was psoriasis and not related to plavix but she states the rash came from the plavix  . Raptiva [Efalizumab] Rash    Current Outpatient Prescriptions  Medication Sig Dispense Refill  . albuterol (PROVENTIL HFA;VENTOLIN HFA) 108 (90 BASE) MCG/ACT inhaler Inhale 1 puff into the lungs every 6 (six) hours as needed. FOR SHORTNESS OF BREATH      . ALPRAZolam (XANAX) 0.5 MG tablet Take 0.25 mg by mouth 2 (two) times daily as needed for anxiety or sleep.      Marland Kitchen ammonium lactate (LAC-HYDRIN) 12 % lotion Apply 1 application topically daily as needed for dry skin (psorias).       Marland Kitchen  aspirin 81 MG tablet Take 81 mg by mouth daily.      . Brinzolamide-Brimonidine (SIMBRINZA) 1-0.2 % SUSP Place 1 drop into the right eye 3 (three) times daily.       . budesonide-formoterol (SYMBICORT) 160-4.5 MCG/ACT inhaler Inhale 2 puffs into the lungs every evening.      Marland Kitchen esomeprazole (NEXIUM) 40 MG capsule Take 40 mg by mouth daily before breakfast.      . feeding supplement, GLUCERNA SHAKE, (GLUCERNA SHAKE) LIQD Take 237 mLs by mouth daily.       . ferrous sulfate 325 (65 FE) MG tablet Take 1 tablet (325 mg total)  by mouth 2 (two) times daily with a meal.  60 tablet  2  . fluticasone (FLONASE) 50 MCG/ACT nasal spray Place 1 spray into both nostrils at bedtime as needed for allergies.       . furosemide (LASIX) 40 MG tablet Take 20 mg by mouth daily.       Marland Kitchen ipratropium-albuterol (DUONEB) 0.5-2.5 (3) MG/3ML SOLN Take 3 mLs by nebulization every 4 (four) hours as needed (shortness of breath).      . Liniments ([employer] ARTHRITIS PAIN RELIEVER EX) Apply 1 application topically daily as needed (for shoulder pains).      . metFORMIN (GLUCOPHAGE) 500 MG tablet Take 500 mg by mouth 2 (two) times daily with a meal.      . Multiple Vitamins-Minerals (ICAPS) CAPS Take 1 capsule by mouth daily.      . nitrofurantoin, macrocrystal-monohydrate, (MACROBID) 100 MG capsule Take 100 mg by mouth 2 (two) times daily. Started on 04/21/14  For 30 days      . predniSONE (DELTASONE) 5 MG tablet Take 5 mg by mouth every Monday, Wednesday, and Friday.       . ranitidine (ZANTAC) 150 MG capsule Take 150 mg by mouth at bedtime.       . RESTASIS 0.05 % ophthalmic emulsion Place 1 drop into both eyes 2 (two) times daily.       . simethicone (MYLICON) 80 MG chewable tablet Chew 1 tablet (80 mg total) by mouth 4 (four) times daily as needed for flatulence.  30 tablet  0  . ticagrelor (BRILINTA) 90 MG TABS tablet Take 1 tablet (90 mg total) by mouth 2 (two) times daily.  60 tablet  0  . tiotropium (SPIRIVA) 18 MCG inhalation capsule Place 1 capsule (18 mcg total) into inhaler and inhale daily.  30 capsule  12  . Travoprost, BAK Free, (TRAVATAMN) 0.004 % SOLN ophthalmic solution Place 1 drop into both eyes at bedtime.       . carvedilol (COREG) 3.125 MG tablet Take 1 tablet (3.125 mg total) by mouth 2 (two) times daily with a meal.  60 tablet  6   No current facility-administered medications for this visit.    Past Medical History  Diagnosis Date  . COPD (chronic obstructive pulmonary disease)   . Hypertension   . Asthma   . Psoriatic  arthritis   . Psoriasis   . GERD (gastroesophageal reflux disease)   . Hiatal hernia   . Chronic sinus infection   . Heart murmur   . Blood transfusion ~ 1962    "11 units of blood"  . Ankylosing spondylitis   . Chronic back pain   . Dysrhythmia     "palpitations"  . Esophageal dilatation   . Candida esophagitis   . Cirrhosis of liver without mention of alcohol   . Hypercholesteremia   .  Peripheral vascular disease   . Exertional dyspnea   . Pneumonia 10/07/11; 2006; 1980's  . Chronic bronchitis     "since childhood" (12/06/2012)  . Type II diabetes mellitus   . Iron deficiency anemia   . External bleeding hemorrhoids   . Sinus headache   . Arthritis     "psorasic"  . OA (osteoarthritis)   . DJD (degenerative joint disease) of knee     "both" (12/06/2012)  . Carpal tunnel syndrome on right   . Anxiety   . Barrett esophagus   . Non-healing ulcer of foot, secondary to diabetes and PVD 12/07/2012  . PVD (peripheral vascular disease) 12/07/2012  . Tobacco abuse 12/07/2012  . Critical lower limb ischemia, with ulcer Lt foot 12/07/2012  . Gout   . COPD (chronic obstructive pulmonary disease)   . Carotid artery disease     dopplers showed mod L ICA stenosis   . PAD (peripheral artery disease)   . History of nuclear stress test 11/2012    lexiscan; normal, low risk   . Dermatophytosis of nail [id number]  . Psoriasis   . GI bleed   . Diverticulosis     Past Surgical History  Procedure Laterality Date  . Nasal sinus surgery  09/29/2009  . Carpal tunnel release  2011    left hand  . Lipoma excision  2010    "left occipital area"  . Cataract extraction w/ intraocular lens implant  2009/2010    right/left  . Eye surgery    . Oophorectomy      'not sure which one"  . Appendectomy    . Angioplasty  12/10/2012    "LLE"; LSFA Diamondback orbital rotational arthrectomy, PTA , stenting with IDEV stent (left ABI improved from 0.35 to 0.75)  . Abdominal hysterectomy  1976  .  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 Esophagogastroduodenoscopy  2011  . Colonoscopy  2007    Hemorrhoids and Diverticulosis  . Esophagogastroduodenoscopy N/A 03/17/2014    Procedure: ESOPHAGOGASTRODUODENOSCOPY (EGD);  Surgeon: Irene Shipper, MD;  Location: [facility];  Service: Endoscopy;  Laterality: N/A;    UKG:URKYHCW:CB colds or fevers, mild weight changes Skin:no rashes or ulcers HEENT:no blurred vision, no congestion CV:see HPI PUL:see HPI GI:no diarrhea constipation ? melena, no indigestion GU:no hematuria, no dysuria MS:no joint pain, no claudication Neuro:no syncope, no lightheadedness Endo:+ diabetes followed by PCP, no thyroid disease  Wt Readings from Last 3 Encounters:  05/12/14 158 lb (71.668 kg)  05/11/14 156 lb 4.8 oz (70.897 kg)  05/09/14 160 lb 12.8 oz (72.938 kg)    PHYSICAL EXAM BP 140/64  Pulse 82  Ht 5\' 4"  (1.626 m)  Wt 158 lb (71.668 kg)  BMI 27.11 kg/m2 General:Pleasant affect, NAD Skin:Warm and dry, brisk capillary refill HEENT:normocephalic, sclera clear, mucus membranes moist Neck:supple, no JVD, no bruits  Heart:S1S2 RRR without murmur, gallup, rub or click Lungs:clear without rales, rhonchi, or wheezes JSE:GBTD, non tender, + BS, do not palpate liver spleen or masses Ext:no lower ext edema, 2+ pedal pulses, 2+ radial pulses Neuro:alert and oriented, MAE, follows commands, + facial symmetry   ASSESSMENT AND PLAN CAD (coronary artery disease), 04/16/14 Impala insertion IVUS, rotational atherectomy of the LAD, angioplasty and stenting of the proximal and mid LAD with Xience Alpine drug-eluting stent Significant coronary artery disease not a candidate for bypass grafting per T CTS. Underwent  Impala insertion. IVIS, rotational atherectomy of the LAD and angioplasty and stenting of the proximal and mid LAD  with science Alpine drug-eluting stent in May of 2015. Other disease included severe distal left main ostial circumflex and RCA. We'll continue Brilinta alone  For one year and then change her to aspirin alone, this is secondary to GI bleed. No chest pain or shortness of breath today.   I contacted Dr. Gwenlyn Found and she is to be on ONLY Brilinta.  We have contacted to stop asprin.   GI bleeding Since my last visit June 1 patient has been transfused 2 units of packed cells. She was confused about her aspirin we will stop it today she needs to be on Brilinat but no aspirin.  She is to have a capsule endoscopy very soon.  ANEMIA, IRON DEFICIENCY She is back on iron  STEMI (ST elevation myocardial infarction) Recent intervention, no further chest pain or shortness of breath.  Hyperlipidemia with target LDL less than 70 We'll continue to monitor  HYPERTENSION stable  Cardiomyopathy, ischemic, EF 35-40% Euvolemic denies shortness of breath currently

## 2014-05-13 ENCOUNTER — Telehealth: Payer: Self-pay | Admitting: *Deleted

## 2014-05-13 ENCOUNTER — Telehealth: Payer: Self-pay | Admitting: Cardiology

## 2014-05-13 NOTE — Telephone Encounter (Signed)
Pt. Instructed to stop taking ASA per Dr. Gwenlyn Found

## 2014-05-13 NOTE — Telephone Encounter (Signed)
Pt. Instructed to stop taking ASA per Dr. Gwenlyn Found, pt. stated understanding of instructions

## 2014-05-15 LAB — CBC
HCT: 31.9 % — ABNORMAL LOW (ref 36.0–46.0)
Hemoglobin: 10.2 g/dL — ABNORMAL LOW (ref 12.0–15.0)
MCH: 27.1 pg (ref 26.0–34.0)
MCHC: 32 g/dL (ref 30.0–36.0)
MCV: 84.8 fL (ref 78.0–100.0)
Platelets: 222 10*3/uL (ref 150–400)
RBC: 3.77 MIL/uL — ABNORMAL LOW (ref 3.87–5.11)
RDW: 17.7 % — AB (ref 11.5–15.5)
WBC: 8.5 10*3/uL (ref 4.0–10.5)

## 2014-05-15 NOTE — Assessment & Plan Note (Signed)
Significant coronary artery disease not a candidate for bypass grafting per T CTS. Underwent Impala insertion. IVIS, rotational atherectomy of the LAD and angioplasty and stenting of the proximal and mid LAD with science Alpine drug-eluting stent in May of 2015. Other disease included severe distal left main ostial circumflex and RCA. We'll continue Brilinta alone  For one year and then change her to aspirin alone, this is secondary to GI bleed. No chest pain or shortness of breath today.   I contacted Dr. Gwenlyn Found and she is to be on ONLY Brilinta.  We have contacted to stop asprin.

## 2014-05-15 NOTE — Assessment & Plan Note (Signed)
Recent intervention, no further chest pain or shortness of breath.

## 2014-05-15 NOTE — Assessment & Plan Note (Signed)
stable °

## 2014-05-15 NOTE — Assessment & Plan Note (Signed)
Since my last visit June 1 patient has been transfused 2 units of packed cells. She was confused about her aspirin we will stop it today she needs to be on Brilinat but no aspirin.  She is to have a capsule endoscopy very soon.

## 2014-05-15 NOTE — Assessment & Plan Note (Signed)
Euvolemic denies shortness of breath currently

## 2014-05-15 NOTE — Assessment & Plan Note (Signed)
She is back on iron

## 2014-05-15 NOTE — Assessment & Plan Note (Signed)
We'll continue to monitor

## 2014-05-16 ENCOUNTER — Telehealth: Payer: Self-pay | Admitting: Cardiovascular Disease

## 2014-05-16 NOTE — Telephone Encounter (Signed)
RN returned call to patient. Had CBC drawn yesterday (ordered by Mickel Baas, NP on 6/15). Would like results. Will defer to Mickel Baas, NP to review and result and patient was notified she will be contacted with results as soon as they come available.

## 2014-05-16 NOTE — Telephone Encounter (Signed)
[name], [place] reviewed CBC results. Hgb is stable from after 2units PRBCs transfused. Information reported to patient. She was happy with news and reports she has a colonoscopy on Tues to evaluate for source of anemia/bleed?

## 2014-05-16 NOTE — Telephone Encounter (Signed)
Pt called again,wants to know if her results are back?

## 2014-05-16 NOTE — Telephone Encounter (Signed)
Pt wants to know if her lab results are back from yesterday?

## 2014-05-19 ENCOUNTER — Ambulatory Visit: Payer: Medicare Other | Admitting: Internal Medicine

## 2014-05-20 ENCOUNTER — Ambulatory Visit
Admission: RE | Admit: 2014-05-20 | Discharge: 2014-05-20 | Disposition: A | Discharge status: Home / Self Care | Payer: Medicare Other | Source: Ambulatory Visit | Attending: Gastroenterology | Admitting: Gastroenterology

## 2014-05-20 DIAGNOSIS — D649 Anemia, unspecified: Secondary | ICD-10-CM

## 2014-05-20 DIAGNOSIS — R195 Other fecal abnormalities: Secondary | ICD-10-CM

## 2014-05-22 ENCOUNTER — Telehealth: Payer: Self-pay | Admitting: Internal Medicine

## 2014-05-22 NOTE — Telephone Encounter (Signed)
Pt calling for results of virtual colon. Please advise.

## 2014-06-03 ENCOUNTER — Telehealth: Payer: Self-pay | Admitting: Cardiovascular Disease

## 2014-06-03 NOTE — Telephone Encounter (Signed)
Please call,need to verify her diagnosis.

## 2014-06-03 NOTE — Telephone Encounter (Signed)
Left messae on voicemail to call back may speak to any triage nurse

## 2014-06-04 NOTE — Telephone Encounter (Signed)
[place] [facility] never called back, will close message and wait for them to call again if they still need assistance.

## 2014-06-10 ENCOUNTER — Encounter: Payer: Self-pay | Admitting: Cardiovascular Disease

## 2014-06-10 ENCOUNTER — Ambulatory Visit (INDEPENDENT_AMBULATORY_CARE_PROVIDER_SITE_OTHER): Payer: Medicare Other | Admitting: Cardiovascular Disease

## 2014-06-10 VITALS — BP 172/80 | HR 90 | Ht 64.0 in | Wt 160.2 lb

## 2014-06-10 DIAGNOSIS — I10 Essential (primary) hypertension: Secondary | ICD-10-CM

## 2014-06-10 DIAGNOSIS — I2510 Atherosclerotic heart disease of native coronary artery without angina pectoris: Secondary | ICD-10-CM

## 2014-06-10 DIAGNOSIS — D500 Iron deficiency anemia secondary to blood loss (chronic): Secondary | ICD-10-CM

## 2014-06-10 DIAGNOSIS — E785 Hyperlipidemia, unspecified: Secondary | ICD-10-CM

## 2014-06-10 LAB — CBC
HCT: 30.2 % — ABNORMAL LOW (ref 36.0–46.0)
Hemoglobin: 9.8 g/dL — ABNORMAL LOW (ref 12.0–15.0)
MCH: 27.4 pg (ref 26.0–34.0)
MCHC: 32.5 g/dL (ref 30.0–36.0)
MCV: 84.4 fL (ref 78.0–100.0)
PLATELETS: 192 10*3/uL (ref 150–400)
RBC: 3.58 MIL/uL — AB (ref 3.87–5.11)
RDW: 17.3 % — ABNORMAL HIGH (ref 11.5–15.5)
WBC: 8.6 10*3/uL (ref 4.0–10.5)

## 2014-06-10 NOTE — Assessment & Plan Note (Signed)
Controlled on current medications 

## 2014-06-10 NOTE — Assessment & Plan Note (Signed)
Status post high-speed rotational atherectomy of LAD via the right radial approach utilizing Impella hemodynamic support via the left common femoral artery performed by doctors Burt Knack and Ellyn Hack on 04/16/14. The patient does have moderate LV dysfunction. Drug-eluting stents were implanted. Patient is on Dual antiplatelet therapy with aspirin and Brilenta..she denies chest pain or shortness of breath. Her major issue has been with blood loss anemia which he has had a negative workup for but has required transfusion.

## 2014-06-10 NOTE — Progress Notes (Signed)
06/10/2014 [name]   August 20, 1941  [id number]  Primary Physician Leola Brazil, MD Primary Cardiologist: Lorretta Harp MD [name]   HPI:  73 year-old female with a history of hypertension, combined chronic heart failure with EF 35% and grade 1 diastolic dysfunction, COPD, CAD, diabetes, perform atrial disease, pulmonary hypertension, GI bleed. 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 Recently hospitalized 04/05/2014 secondary to acute respiratory failure requiring intubation. During the hospitalization her peak troponin was > 20 she had pulmonary edema requiring IV diuresing. Echo was done EF is 35-40% she did undergo left heart cath 04/09/2014 with three-vessel disease involving proximal and mid LAD, severe distal left main, ostial circumflex and RCA. PCI was not feasible due to coronary anatomy. T CTS evaluated her for bypass grafting but she was found to be a poor surgical candidate. PCI was felt to be the safest intervention at that point. 04/16/2014 she underwent Impala insertion IVUS, rotational atherectomy of the LAD, angioplasty and stenting of the proximal and mid LAD with Xience Alpine drug-eluting stent. Postop was complicated by hypotension, cardiogenic shock requiring dopamine drip. She also had acute blood loss anemia requiring 2 units packed RBCs. The plan is to use Brilinta alone for her new stent for 12 months and then change to aspirin alone daily. She was mobilized by cardiac rehabilitation, continued to to improve and was finally discharged home 04/21/2014. I saw Pt for follow up on 04/28/14.  She had not started her Iron at that time. She had also complained of some dark stools. Labs were ordered and her H/H Had decreased from 9.4 to 8.8. Arrangements were made for her to see GI which she did and was then admitted for transfusion of 2 units of packed cells on 05/09/2014. Plans are also new for virtual colonoscopy with  camera.  Since her procedure she has done well except for blood loss anemia of unclear etiology despite aggressive GI evaluation. Initially I had recommended that she stop her aspirin however since she has a drug-eluting stent in LAD and is on Brilenta I suggested that she restart her aspirin with careful followup of her hemoglobin.    Current Outpatient Prescriptions  Medication Sig Dispense Refill  . albuterol (PROVENTIL HFA;VENTOLIN HFA) 108 (90 BASE) MCG/ACT inhaler Inhale 1 puff into the lungs every 6 (six) hours as needed. FOR SHORTNESS OF BREATH      . ALPRAZolam (XANAX) 0.5 MG tablet Take 0.25 mg by mouth 2 (two) times daily as needed for anxiety or sleep.      Marland Kitchen ammonium lactate (LAC-HYDRIN) 12 % lotion Apply 1 application topically daily as needed for dry skin (psorias).       . Brinzolamide-Brimonidine (SIMBRINZA) 1-0.2 % SUSP Place 1 drop into the right eye 3 (three) times daily.       . budesonide-formoterol (SYMBICORT) 160-4.5 MCG/ACT inhaler Inhale 2 puffs into the lungs every evening.      . carvedilol (COREG) 3.125 MG tablet Take 1 tablet (3.125 mg total) by mouth 2 (two) times daily with a meal.  60 tablet  6  . esomeprazole (NEXIUM) 40 MG capsule Take 40 mg by mouth daily before breakfast.      . feeding supplement, GLUCERNA SHAKE, (GLUCERNA SHAKE) LIQD Take 237 mLs by mouth daily.       . ferrous sulfate 325 (65 FE) MG tablet Take 1 tablet (325 mg total) by mouth 2 (two) times daily with a meal.  60 tablet  2  . fluticasone (FLONASE) 50 MCG/ACT nasal spray Place 1 spray into both nostrils at bedtime as needed for allergies.       . furosemide (LASIX) 40 MG tablet Take 20 mg by mouth daily.       Marland Kitchen ipratropium-albuterol (DUONEB) 0.5-2.5 (3) MG/3ML SOLN Take 3 mLs by nebulization every 4 (four) hours as needed (shortness of breath).      . Liniments ([employer] ARTHRITIS PAIN RELIEVER EX) Apply 1 application topically daily as needed (for shoulder pains).      . metFORMIN (GLUCOPHAGE)  500 MG tablet Take 500 mg by mouth 2 (two) times daily with a meal.      . Multiple Vitamins-Minerals (ICAPS) CAPS Take 1 capsule by mouth daily.      . predniSONE (DELTASONE) 5 MG tablet Take 5 mg by mouth every Monday, Wednesday, and Friday.       . ranitidine (ZANTAC) 150 MG capsule Take 150 mg by mouth at bedtime.       . RESTASIS 0.05 % ophthalmic emulsion Place 1 drop into both eyes 2 (two) times daily.       . simethicone (MYLICON) 80 MG chewable tablet Chew 1 tablet (80 mg total) by mouth 4 (four) times daily as needed for flatulence.  30 tablet  0  . ticagrelor (BRILINTA) 90 MG TABS tablet Take 1 tablet (90 mg total) by mouth 2 (two) times daily.  60 tablet  0  . tiotropium (SPIRIVA) 18 MCG inhalation capsule Place 1 capsule (18 mcg total) into inhaler and inhale daily.  30 capsule  12  . Travoprost, BAK Free, (TRAVATAMN) 0.004 % SOLN ophthalmic solution Place 1 drop into both eyes at bedtime.       Marland Kitchen aspirin 81 MG tablet Take 81 mg by mouth daily.       No current facility-administered medications for this visit.    Allergies  Allergen Reactions  . Amlodipine Besy-Benazepril Hcl Other (See Comments)    Nervous/shakiness  . Statins Other (See Comments)    "can't take any of them; cramp me up" (12/06/2012)  . Tradjenta [Linagliptin] Other (See Comments)    Extreme muscle pains, chest and back pains  . Plavix [Clopidogrel] Rash    Severe burning of the skin.Pt states that the doctor told her that her rash was psoriasis and not related to plavix but she states the rash came from the plavix  . Raptiva [Efalizumab] Rash    History   Social History  . Marital Status: Widowed    Spouse Name: N/A    Number of Children: 0  . Years of Education: N/A   Occupational History  . 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 Smoking status: Former Smoker -- 1.00 packs/day for 40 years    Types: Cigarettes  . Smokeless tobacco: Former Systems developer    Quit date:  09/28/2013     Comment: pt states that she smokes "every once in a while"  . Alcohol Use: No     Comment: "quit social drinking years ago"  . Drug Use: No  . Sexual Activity: No   Other Topics Concern  . Not on file   Social History Narrative  . No narrative on file     Review of Systems: General: negative for chills, fever, night sweats or weight changes.  Cardiovascular: negative for chest pain, dyspnea on exertion, edema, orthopnea, palpitations, paroxysmal nocturnal dyspnea or shortness of breath Dermatological:  negative for rash Respiratory: negative for cough or wheezing Urologic: negative for hematuria Abdominal: negative for nausea, vomiting, diarrhea, bright red blood per rectum, melena, or hematemesis Neurologic: negative for visual changes, syncope, or dizziness All other systems reviewed and are otherwise negative except as noted above.    Blood pressure 172/80, pulse 90, height 5\' 4"  (1.626 m), weight 160 lb 3.2 oz (72.666 kg).  General appearance: alert and no distress Neck: no adenopathy, no carotid bruit, no JVD, supple, symmetrical, trachea midline and thyroid not enlarged, symmetric, no tenderness/mass/nodules Lungs: clear to auscultation bilaterally Heart: regular rate and rhythm, S1, S2 normal, no murmur, click, rub or gallop Extremities: extremities normal, atraumatic, no cyanosis or edema  EKG not performed today  ASSESSMENT AND PLAN:   CAD (coronary artery disease), 04/16/14 Impala insertion IVUS, rotational atherectomy of the LAD, angioplasty and stenting of the proximal and mid LAD with Xience Alpine drug-eluting stent Status post high-speed rotational atherectomy of LAD via the right radial approach utilizing Impella hemodynamic support via the left common femoral artery performed by doctors Burt Knack and Ellyn Hack on 04/16/14. The patient does have moderate LV dysfunction. Drug-eluting stents were implanted. Patient is on Dual antiplatelet therapy with  aspirin and Brilenta..she denies chest pain or shortness of breath. Her major issue has been with blood loss anemia which he has had a negative workup for but has required transfusion.  Hyperlipidemia with target LDL less than 70 On statin therapy followed by her PCP  HYPERTENSION Controlled on current medications      Lorretta Harp MD [facility], [facility] 06/10/2014 11:23 AM

## 2014-06-10 NOTE — Patient Instructions (Signed)
We request that you follow-up in: 1 months with Cecilie Kicks NP and in 4 months with Dr Andria Rhein will receive a reminder letter in the mail two months in advance. If you don't receive a letter, please call our office to schedule the follow-up appointment.  Medication changes-Start: Aspirin 81mg  daily and Coreg 3.12mg  twice a day                                    Keep a record of your blood pressure and heart rate daily  Have blood work today

## 2014-06-10 NOTE — Assessment & Plan Note (Signed)
On statin therapy followed by her PCP 

## 2014-06-11 ENCOUNTER — Telehealth: Payer: Self-pay | Admitting: *Deleted

## 2014-06-11 ENCOUNTER — Ambulatory Visit (INDEPENDENT_AMBULATORY_CARE_PROVIDER_SITE_OTHER): Payer: Medicare Other | Admitting: Podiatry

## 2014-06-11 DIAGNOSIS — I3139 Other pericardial effusion (noninflammatory): Secondary | ICD-10-CM

## 2014-06-11 DIAGNOSIS — M79671 Pain in right foot: Secondary | ICD-10-CM

## 2014-06-11 DIAGNOSIS — I313 Pericardial effusion (noninflammatory): Secondary | ICD-10-CM

## 2014-06-11 DIAGNOSIS — B351 Tinea unguium: Secondary | ICD-10-CM

## 2014-06-11 DIAGNOSIS — M79609 Pain in unspecified limb: Secondary | ICD-10-CM

## 2014-06-11 NOTE — Telephone Encounter (Signed)
I spoke with patient and she is agreeable to proceed with the echo. Order placed

## 2014-06-11 NOTE — Telephone Encounter (Signed)
Message copied by Chauncy Lean on Wed Jun 11, 2014  2:06 PM ------      Message from: Lorretta Harp      Created: Wed Jun 11, 2014  6:32 AM      Regarding: RE: 2D       I did not mention that but I wanted to eval pericardila effusion seen on CT as well as LV fxn since her intervention.            JJB      ----- Message -----         From: Chauncy Lean, RN         Sent: 06/10/2014   4:19 PM           To: Lorretta Harp, MD      Subject: RE: 2D                                                   We saw her today. Did you mention that to her?            ----- Message -----         From: Lorretta Harp, MD         Sent: 06/10/2014   3:52 PM           To: Chauncy Lean, RN      Subject: 2D                                                       [name], let's get a 2D on [name] to eval her pericardial effusion.            JJB             ------

## 2014-06-12 NOTE — Progress Notes (Signed)
Patient ID: [name], female   DOB: [birth year], 73 y.o.   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

## 2014-06-19 ENCOUNTER — Other Ambulatory Visit: Payer: Self-pay

## 2014-06-19 MED ORDER — TICAGRELOR 90 MG PO TABS: 90.0000 mg | ORAL_TABLET | Freq: Two times a day (BID) | ORAL | Status: DC

## 2014-06-19 NOTE — Telephone Encounter (Signed)
Rx was sent to pharmacy electronically. 

## 2014-07-01 ENCOUNTER — Ambulatory Visit (HOSPITAL_COMMUNITY)
Admission: RE | Admit: 2014-07-01 | Discharge: 2014-07-01 | Disposition: A | Discharge status: Home / Self Care | Payer: Medicare Other | Source: Ambulatory Visit | Attending: Internal Medicine | Admitting: Internal Medicine

## 2014-07-01 ENCOUNTER — Ambulatory Visit (HOSPITAL_BASED_OUTPATIENT_CLINIC_OR_DEPARTMENT_OTHER)
Admission: RE | Admit: 2014-07-01 | Discharge: 2014-07-01 | Disposition: A | Discharge status: Home / Self Care | Payer: Medicare Other | Source: Ambulatory Visit | Attending: Cardiovascular Disease | Admitting: Cardiovascular Disease

## 2014-07-01 DIAGNOSIS — I3139 Other pericardial effusion (noninflammatory): Secondary | ICD-10-CM

## 2014-07-01 DIAGNOSIS — I739 Peripheral vascular disease, unspecified: Secondary | ICD-10-CM | POA: Diagnosis not present

## 2014-07-01 DIAGNOSIS — I6529 Occlusion and stenosis of unspecified carotid artery: Secondary | ICD-10-CM | POA: Insufficient documentation

## 2014-07-01 DIAGNOSIS — I319 Disease of pericardium, unspecified: Secondary | ICD-10-CM

## 2014-07-01 DIAGNOSIS — I313 Pericardial effusion (noninflammatory): Secondary | ICD-10-CM

## 2014-07-01 DIAGNOSIS — I6522 Occlusion and stenosis of left carotid artery: Secondary | ICD-10-CM

## 2014-07-01 NOTE — Progress Notes (Signed)
Carotid Duplex Completed. °Brianna L Mazza,RVT °

## 2014-07-01 NOTE — Progress Notes (Signed)
2D Echo Performed 07/01/2014    Marygrace Drought, RCS

## 2014-07-01 NOTE — Progress Notes (Signed)
Lower Extremity Arterial Duplex Completed. °Brianna L Mazza,RVT °

## 2014-07-07 ENCOUNTER — Other Ambulatory Visit: Payer: Self-pay | Admitting: *Deleted

## 2014-07-07 ENCOUNTER — Telehealth: Payer: Self-pay | Admitting: Cardiovascular Disease

## 2014-07-07 DIAGNOSIS — I5043 Acute on chronic combined systolic (congestive) and diastolic (congestive) heart failure: Secondary | ICD-10-CM

## 2014-07-07 MED ORDER — FUROSEMIDE 40 MG PO TABS: 40.0000 mg | ORAL_TABLET | Freq: Every day | ORAL | Status: DC

## 2014-07-07 NOTE — Telephone Encounter (Signed)
New Prob   Pt is currently in the heart failure program and recently had stents placed. States she is experiencing worsening SOB and weakness and requesting to speak to a nurse.

## 2014-07-07 NOTE — Telephone Encounter (Signed)
Discussed with dr berry, pt instructed to increase furosemide to 40 mg bid x 3 days and then she will take 40 mg of lasix daily. Pt repeated new medication regimen. Orders placed for lab work and pt has a follow up appt Monday with laura ingold np. She does not need a script currently

## 2014-07-07 NOTE — Telephone Encounter (Signed)
Spoke with pt, she is in the heart failure program through her insurance, they wanted her to call this morning. She has started waking up at night feeling SOB, this is new for her. Her weight is up <2lb in 5 days. She reports her abdomin is distended, she gets SOB with activity within the home. She is currently taking 20 mg of furosemide daily. She is also c/o feeling weak as she did prior to her blood count getting so low. She has a call into dr kilpatrick's office, they are following her anemia. Will discuss with dr berry.

## 2014-07-11 LAB — BASIC METABOLIC PANEL WITH GFR
BUN: 31 mg/dL — ABNORMAL HIGH (ref 6–23)
CHLORIDE: 97 meq/L (ref 96–112)
CO2: 28 meq/L (ref 19–32)
Calcium: 10.3 mg/dL (ref 8.4–10.5)
Creat: 1.2 mg/dL — ABNORMAL HIGH (ref 0.50–1.10)
Glucose, Bld: 242 mg/dL — ABNORMAL HIGH (ref 70–99)
Potassium: 4.8 mEq/L (ref 3.5–5.3)
SODIUM: 139 meq/L (ref 135–145)

## 2014-07-14 ENCOUNTER — Ambulatory Visit (INDEPENDENT_AMBULATORY_CARE_PROVIDER_SITE_OTHER): Payer: Medicare Other | Admitting: Cardiology

## 2014-07-14 ENCOUNTER — Encounter: Payer: Self-pay | Admitting: Cardiology

## 2014-07-14 VITALS — BP 120/60 | HR 68 | Ht 65.0 in | Wt 158.5 lb

## 2014-07-14 DIAGNOSIS — Z79899 Other long term (current) drug therapy: Secondary | ICD-10-CM

## 2014-07-14 DIAGNOSIS — I255 Ischemic cardiomyopathy: Secondary | ICD-10-CM

## 2014-07-14 DIAGNOSIS — I272 Pulmonary hypertension, unspecified: Secondary | ICD-10-CM

## 2014-07-14 DIAGNOSIS — I313 Pericardial effusion (noninflammatory): Secondary | ICD-10-CM

## 2014-07-14 DIAGNOSIS — I2589 Other forms of chronic ischemic heart disease: Secondary | ICD-10-CM

## 2014-07-14 DIAGNOSIS — I2510 Atherosclerotic heart disease of native coronary artery without angina pectoris: Secondary | ICD-10-CM

## 2014-07-14 DIAGNOSIS — E785 Hyperlipidemia, unspecified: Secondary | ICD-10-CM

## 2014-07-14 DIAGNOSIS — I2789 Other specified pulmonary heart diseases: Secondary | ICD-10-CM

## 2014-07-14 DIAGNOSIS — R0602 Shortness of breath: Secondary | ICD-10-CM

## 2014-07-14 DIAGNOSIS — I3139 Other pericardial effusion (noninflammatory): Secondary | ICD-10-CM

## 2014-07-14 DIAGNOSIS — I319 Disease of pericardium, unspecified: Secondary | ICD-10-CM

## 2014-07-14 LAB — CBC
HCT: 29.2 % — ABNORMAL LOW (ref 36.0–46.0)
HEMOGLOBIN: 9.5 g/dL — AB (ref 12.0–15.0)
MCH: 28.3 pg (ref 26.0–34.0)
MCHC: 32.5 g/dL (ref 30.0–36.0)
MCV: 86.9 fL (ref 78.0–100.0)
Platelets: 186 10*3/uL (ref 150–400)
RBC: 3.36 MIL/uL — ABNORMAL LOW (ref 3.87–5.11)
RDW: 17.2 % — ABNORMAL HIGH (ref 11.5–15.5)
WBC: 6.7 10*3/uL (ref 4.0–10.5)

## 2014-07-14 NOTE — Progress Notes (Signed)
07/20/2014   PCP: Leola Brazil, MD   Chief Complaint  Patient presents with  . 4 week ROV    f/u on echo- SOB has increased.    Primary Cardiologist: Dr. Adora Fridge   HPI:  73 year-old female with a history of hypertension, combined chronic heart failure with EF 35% and grade 1 diastolic dysfunction, COPD, CAD, diabetes, perform atrial disease, pulmonary hypertension, GI bleed. 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 Recently hospitalized 04/05/2014 secondary to acute respiratory failure requiring intubation. During the hospitalization her peak troponin was > 20 she had pulmonary edema requiring IV diuresing. Echo was done EF is 35-40% she did undergo left heart cath 04/09/2014 with three-vessel disease involving proximal and mid LAD, severe distal left main, ostial circumflex and RCA. PCI was not feasible due to coronary anatomy. T CTS evaluated her for bypass grafting but she was found to be a poor surgical candidate. PCI was felt to be the safest intervention at that point. 04/16/2014 she underwent Impala insertion IVUS, rotational atherectomy of the LAD, angioplasty and stenting of the proximal and mid LAD with Xience Alpine drug-eluting stent. Postop was complicated by hypotension, cardiogenic shock requiring dopamine drip. She also had acute blood loss anemia requiring 2 units packed RBCs. The plan is to use Brilinta and aspirin daily.  She has had issues with GI bleed requiring transfusion most recent 05/09/14.    She is here today for follow up and results of echo, carotid dopplers and lower ext dopplers.  She is complaining of SOB.  Comes and goes, she feels similar to when she was anemic.  No chest pain.    Her Echo done 07/01/14 --Normal LV size with mild asymmetric septal hypertrophy and no LV outflow tract gradient. Normal LV size. EF 35% with wall motion abnormalities as noted above. If there would consideration for ICD  placement, could do cardiac MRI to confirm EF. Normal RV size and systolic function. Moderate circumferential pericardial effusion without evidence for tamponade. IVC collapses with inspiration.  I asked Dr. Jerilynn Mages.  Croitoru to review the echo and compare to previous, but effusion does not seem increased.  PA pk pressure is 43 mmHg.    Carotid doppler 07/01/14 with 50-69% stenosis in Lt carotid artery  Lower ext dopplers 07/01/14 with ABIs Rt 0.85, Lt 0.84 done with hx Lt SFA stent.- stable.   Allergies  Allergen Reactions  . Amlodipine Besy-Benazepril Hcl Other (See Comments)    Nervous/shakiness  . Statins Other (See Comments)    "can't take any of them; cramp me up" (12/06/2012)  . Tradjenta [Linagliptin] Other (See Comments)    Extreme muscle pains, chest and back pains  . Plavix [Clopidogrel] Rash    Severe burning of the skin.Pt states that the doctor told her that her rash was psoriasis and not related to plavix but she states the rash came from the plavix  . Raptiva [Efalizumab] Rash    Current Outpatient Prescriptions  Medication Sig Dispense Refill  . albuterol (PROVENTIL HFA;VENTOLIN HFA) 108 (90 BASE) MCG/ACT inhaler Inhale 1 puff into the lungs every 6 (six) hours as needed. FOR SHORTNESS OF BREATH      . ALPRAZolam (XANAX) 0.5 MG tablet Take 0.25 mg by mouth 2 (two) times daily as needed for anxiety or sleep.      Marland Kitchen ammonium lactate (LAC-HYDRIN) 12 % lotion Apply 1 application topically daily as needed for dry  skin (psorias).       Marland Kitchen aspirin 81 MG tablet Take 81 mg by mouth daily.      . Brinzolamide-Brimonidine (SIMBRINZA) 1-0.2 % SUSP Place 1 drop into the right eye 3 (three) times daily.       . budesonide-formoterol (SYMBICORT) 160-4.5 MCG/ACT inhaler Inhale 2 puffs into the lungs every evening.      . carvedilol (COREG) 3.125 MG tablet Take 1 tablet (3.125 mg total) by mouth 2 (two) times daily with a meal.  60 tablet  6  . esomeprazole (NEXIUM) 40 MG capsule Take 40 mg by  mouth daily before breakfast.      . feeding supplement, GLUCERNA SHAKE, (GLUCERNA SHAKE) LIQD Take 237 mLs by mouth daily.       . ferrous sulfate 325 (65 FE) MG tablet Take 1 tablet (325 mg total) by mouth 2 (two) times daily with a meal.  60 tablet  2  . fluticasone (FLONASE) 50 MCG/ACT nasal spray Place 1 spray into both nostrils at bedtime as needed for allergies.       . furosemide (LASIX) 40 MG tablet Take 1 tablet (40 mg total) by mouth daily.  30 tablet    . ipratropium-albuterol (DUONEB) 0.5-2.5 (3) MG/3ML SOLN Take 3 mLs by nebulization every 4 (four) hours as needed (shortness of breath).      . Liniments ([employer] ARTHRITIS PAIN RELIEVER EX) Apply 1 application topically daily as needed (for shoulder pains).      . metFORMIN (GLUCOPHAGE) 500 MG tablet Take 500 mg by mouth 2 (two) times daily with a meal.      . Multiple Vitamins-Minerals (ICAPS) CAPS Take 1 capsule by mouth daily.      . predniSONE (DELTASONE) 5 MG tablet Take 5 mg by mouth every Monday, Wednesday, and Friday.       . ranitidine (ZANTAC) 150 MG capsule Take 150 mg by mouth at bedtime.       . RESTASIS 0.05 % ophthalmic emulsion Place 1 drop into both eyes 2 (two) times daily.       . simethicone (MYLICON) 80 MG chewable tablet Chew 1 tablet (80 mg total) by mouth 4 (four) times daily as needed for flatulence.  30 tablet  0  . ticagrelor (BRILINTA) 90 MG TABS tablet Take 1 tablet (90 mg total) by mouth 2 (two) times daily.  180 tablet  3  . tiotropium (SPIRIVA) 18 MCG inhalation capsule Place 1 capsule (18 mcg total) into inhaler and inhale daily.  30 capsule  12  . Travoprost, BAK Free, (TRAVATAMN) 0.004 % SOLN ophthalmic solution Place 1 drop into both eyes at bedtime.        No current facility-administered medications for this visit.    Past Medical History  Diagnosis Date  . COPD (chronic obstructive pulmonary disease)   . Hypertension   . Asthma   . Psoriatic arthritis   . Psoriasis   . GERD (gastroesophageal  reflux disease)   . Hiatal hernia   . Chronic sinus infection   . Heart murmur   . Blood transfusion ~ 1962    "11 units of blood"  . Ankylosing spondylitis   . Chronic back pain   . Dysrhythmia     "palpitations"  . Esophageal dilatation   . Candida esophagitis   . Cirrhosis of liver without mention of alcohol   . Hypercholesteremia   . Peripheral vascular disease   . Exertional dyspnea   . Pneumonia 10/07/11; 2006; 1980's  .  Chronic bronchitis     "since childhood" (12/06/2012)  . Type II diabetes mellitus   . Iron deficiency anemia   . External bleeding hemorrhoids   . Sinus headache   . Arthritis     "psorasic"  . OA (osteoarthritis)   . DJD (degenerative joint disease) of knee     "both" (12/06/2012)  . Carpal tunnel syndrome on right   . Anxiety   . Barrett esophagus   . Non-healing ulcer of foot, secondary to diabetes and PVD 12/07/2012  . PVD (peripheral vascular disease) 12/07/2012  . Tobacco abuse 12/07/2012  . Critical lower limb ischemia, with ulcer Lt foot 12/07/2012  . Gout   . COPD (chronic obstructive pulmonary disease)   . Carotid artery disease     dopplers showed mod L ICA stenosis   . PAD (peripheral artery disease)   . History of nuclear stress test 11/2012    lexiscan; normal, low risk   . Dermatophytosis of nail [id number]  . Psoriasis   . GI bleed   . Diverticulosis     Past Surgical History  Procedure Laterality Date  . Nasal sinus surgery  09/29/2009  . Carpal tunnel release  2011    left hand  . Lipoma excision  2010    "left occipital area"  . Cataract extraction w/ intraocular lens implant  2009/2010    right/left  . Eye surgery    . Oophorectomy      'not sure which one"  . Appendectomy    . Angioplasty  12/10/2012    "LLE"; LSFA Diamondback orbital rotational arthrectomy, PTA , stenting with IDEV stent (left ABI improved from 0.35 to 0.75)  . Abdominal hysterectomy  1976  . Transmetatarsal amputation Left 01/08/2013    Procedure:  TRANSMETATARSAL AMPUTATION;  Surgeon: Angelia Mould, MD;  Location: [facility];  Service: Vascular;  Laterality: Left;  . Esophagogastroduodenoscopy  2011  . Colonoscopy  2007    Hemorrhoids and Diverticulosis  . Esophagogastroduodenoscopy N/A 03/17/2014    Procedure: ESOPHAGOGASTRODUODENOSCOPY (EGD);  Surgeon: Irene Shipper, MD;  [facility] ENDOSCOPY;  Service: Endoscopy;  Laterality: N/A;    MPN:TIRWERX:VQ colds or fevers, no weight changes Skin:no rashes or ulcers HEENT:no blurred vision, no congestion CV:see HPI PUL:see HPI GI:no diarrhea constipation or melena, no indigestion- but feels bloated GU:no hematuria, no dysuria MS:no joint pain, no claudication Neuro:no syncope, no lightheadedness Endo:+ diabetes- stable, no thyroid disease  Wt Readings from Last 3 Encounters:  07/14/14 158 lb 8 oz (71.895 kg)  06/10/14 160 lb 3.2 oz (72.666 kg)  05/12/14 158 lb (71.668 kg)    PHYSICAL EXAM BP 120/60  Pulse 68  Ht 5\' 5"  (1.651 m)  Wt 158 lb 8 oz (71.895 kg)  BMI 26.38 kg/m2 General:Pleasant affect, NAD Skin:Warm and dry, brisk capillary refill HEENT:normocephalic, sclera clear, mucus membranes moist Neck:supple, no JVD, no bruits  Heart:S1S2 RRR without murmur, gallup, rub or click Lungs:diminished without rales, rhonchi, or wheezes MGQ:QPYP, non tender, + BS, do not palpate liver spleen or masses Ext:no lower ext edema, 2+ pedal pulses, 2+ radial pulses Neuro:alert and oriented, MAE, follows commands, + facial symmetry   ASSESSMENT AND PLAN SOB (shortness of breath) Pt with episodic SOB, she does have pericardial effusion but not increased.  This could be cause of SOB but Brilinta could be as well.  She has been on this since May - she has an allergy to Plavix with severe burning of her skin.  I will also check  her CBC as anemia has caused similar symptoms in the past.  She will follow up in 2 weeks, I will discuss with Dr. Adora Fridge.    Cardiomyopathy, ischemic, EF  35-40% Ef currently 35%, now on BB.  Will continue to monitor and review with Dr. Adora Fridge   Hyperlipidemia with target LDL less than 70 Recheck lipids in 6 weeks  Pulmonary hypertension Not sure how much this is affecting her SOB  CAD (coronary artery disease), 04/16/14 Impala insertion IVUS, rotational atherectomy of the LAD, angioplasty and stenting of the proximal and mid LAD with Xience Alpine drug-eluting stent Is now on aspirin and Brilinta, will need DAPT for 1 year.  Monitor closely for GI bleed.  Pericardial effusion Stable.

## 2014-07-14 NOTE — Patient Instructions (Addendum)
Your physician recommends that you schedule a follow-up appointment in: 2 Weeks   Your physician recommends that you return for lab work in: [place] today  Your physician recommends that you return for lab work in: 2 weeks CMP and Fasting lipids  Your physician has recommended you make the following change in your medication: Continue lasix 40 mg daily  Weigh daily Call [phone] if weight climbs more than 3 pounds in a day or 5 pounds in a week. No salt to very little salt in your diet.  No more than 2000 mg in a day. Call if increased shortness of breath or increased swelling.

## 2014-07-15 ENCOUNTER — Telehealth: Payer: Self-pay | Admitting: Cardiology

## 2014-07-15 NOTE — Telephone Encounter (Signed)
Patient states that she saw Cecilie Kicks, NP yesterday and was asked to have a CBC done today---Laura told her she would get the results today.   Please call.

## 2014-07-16 ENCOUNTER — Telehealth: Payer: Self-pay | Admitting: *Deleted

## 2014-07-16 NOTE — Telephone Encounter (Signed)
I received a message from [facility], see below:    [employer] would like to contact her (Ms Baldwin)  for our "Protected PCI" website.  Feel free to check it out if you would like to, [email or web address]  We also will have the mobile learning lab here at [facility] on September 3rd and would like to invite her to come to the truck as a success story!  I can forward you the pt consent if you can just send Ms. Anstey' contact information to me and I will get I contact with her!    Thank You and feel free to reach out to me with any questions! Kind Regards, _______________________________   Ree Kida, [employer] Clinical Consultant   [email or web address]    I contacted Ms Ransom to make sure that she was okay if I have her information to [place].  Ms Feutz said that I could share her phone [place] [address] Cell : [phone] [email or web address]   Visit [place] at [email or web address]

## 2014-07-20 DIAGNOSIS — I3139 Other pericardial effusion (noninflammatory): Secondary | ICD-10-CM | POA: Insufficient documentation

## 2014-07-20 DIAGNOSIS — I313 Pericardial effusion (noninflammatory): Secondary | ICD-10-CM | POA: Insufficient documentation

## 2014-07-20 DIAGNOSIS — R0602 Shortness of breath: Secondary | ICD-10-CM | POA: Insufficient documentation

## 2014-07-20 NOTE — Assessment & Plan Note (Signed)
Recheck lipids in 6 weeks

## 2014-07-20 NOTE — Assessment & Plan Note (Signed)
Is now on aspirin and Brilinta, will need DAPT for 1 year.  Monitor closely for GI bleed.

## 2014-07-20 NOTE — Assessment & Plan Note (Signed)
Pt with episodic SOB, she does have pericardial effusion but not increased.  This could be cause of SOB but Brilinta could be as well.  She has been on this since May - she has an allergy to Plavix with severe burning of her skin.  I will also check her CBC as anemia has caused similar symptoms in the past.  She will follow up in 2 weeks, I will discuss with Dr. Adora Fridge.

## 2014-07-20 NOTE — Assessment & Plan Note (Signed)
Not sure how much this is affecting her SOB

## 2014-07-20 NOTE — Assessment & Plan Note (Signed)
Stable

## 2014-07-20 NOTE — Assessment & Plan Note (Signed)
Ef currently 35%, now on BB.  Will continue to monitor and review with Dr. Adora Fridge

## 2014-08-01 ENCOUNTER — Encounter: Payer: Self-pay | Admitting: Physician Assistant

## 2014-08-01 ENCOUNTER — Ambulatory Visit (INDEPENDENT_AMBULATORY_CARE_PROVIDER_SITE_OTHER): Payer: Medicare Other | Admitting: Physician Assistant

## 2014-08-01 VITALS — BP 110/80 | HR 76 | Ht 65.0 in | Wt 160.6 lb

## 2014-08-01 DIAGNOSIS — I3139 Other pericardial effusion (noninflammatory): Secondary | ICD-10-CM

## 2014-08-01 DIAGNOSIS — R0602 Shortness of breath: Secondary | ICD-10-CM

## 2014-08-01 DIAGNOSIS — I255 Ischemic cardiomyopathy: Secondary | ICD-10-CM

## 2014-08-01 DIAGNOSIS — D509 Iron deficiency anemia, unspecified: Secondary | ICD-10-CM

## 2014-08-01 DIAGNOSIS — I319 Disease of pericardium, unspecified: Secondary | ICD-10-CM

## 2014-08-01 DIAGNOSIS — I313 Pericardial effusion (noninflammatory): Secondary | ICD-10-CM

## 2014-08-01 DIAGNOSIS — I2589 Other forms of chronic ischemic heart disease: Secondary | ICD-10-CM

## 2014-08-01 NOTE — Progress Notes (Signed)
Date:  08/01/2014   ID:  [name], DOB [birth year], MRN [id number]  PCP:  Leola Brazil, MD  Primary Cardiologist:  Gwenlyn Found     History of Present Illness: [name] is a 73 y.o. female with a history of hypertension, combined chronic heart failure with EF 35% and grade 1 diastolic dysfunction, COPD, CAD, diabetes, perform atrial disease, pulmonary hypertension, GI bleed. 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 Recently hospitalized 04/05/2014 secondary to acute respiratory failure requiring intubation. During the hospitalization her peak troponin was > 20 she had pulmonary edema requiring IV diuresing. Echo was done EF is 35-40% she did undergo left heart cath 04/09/2014 with three-vessel disease involving proximal and mid LAD, severe distal left main, ostial circumflex and RCA. PCI was not feasible due to coronary anatomy. T CTS evaluated her for bypass grafting but she was found to be a poor surgical candidate. PCI was felt to be the safest intervention at that point. 04/16/2014 she underwent Impala insertion IVUS, rotational atherectomy of the LAD, angioplasty and stenting of the proximal and mid LAD with Xience Alpine drug-eluting stent. Postop was complicated by hypotension, cardiogenic shock requiring dopamine drip. She also had acute blood loss anemia requiring 2 units packed RBCs. The plan is to use Brilinta and aspirin daily. She has had issues with GI bleed requiring transfusion most recent 05/09/14.   She is here today for follow up.  She reports a little SOB which improved after nebulizer at home.  She is concernd about the pericardial effusion.  Some dizziness in the AM and is wondering about stopping Coreg.  I reviewed her log and she had one day where BP was mildly low.  She sleeps elevated due to reflux and has for 20+ years.  Her bed actually broke and stuck in the perfect position for her.  She currently denies nausea, vomiting,  fever, chest pain,  ND, cough, congestion, abdominal pain, hematochezia, melena, lower extremity edema, claudication.  Echo done 07/01/14 --Normal LV size with mild asymmetric septal hypertrophy and no LV outflow tract gradient. Normal LV size. EF 35% with wall motion abnormalities as noted above. If there would consideration for ICD placement, could do cardiac MRI to confirm EF. Normal RV size and systolic function. Moderate circumferential pericardial effusion without evidence for tamponade. IVC collapses with inspiration. I asked Dr. Jerilynn Mages. Croitoru to review the echo and compare to previous, but effusion does not seem increased. PA pk pressure is 43 mmHg.   Carotid doppler 07/01/14 with 50-69% stenosis in Lt carotid artery  Lower ext dopplers 07/01/14 with ABIs Rt 0.85, Lt 0.84 done with hx Lt SFA stent.- stable.     Wt Readings from Last 3 Encounters:  08/01/14 160 lb 9.6 oz (72.848 kg)  07/14/14 158 lb 8 oz (71.895 kg)  06/10/14 160 lb 3.2 oz (72.666 kg)     Past Medical History  Diagnosis Date  . COPD (chronic obstructive pulmonary disease)   . Hypertension   . Asthma   . Psoriatic arthritis   . Psoriasis   . GERD (gastroesophageal reflux disease)   . Hiatal hernia   . Chronic sinus infection   . Heart murmur   . Blood transfusion ~ 1962    "11 units of blood"  . Ankylosing spondylitis   . Chronic back pain   . Dysrhythmia     "palpitations"  . Esophageal dilatation   . Candida esophagitis   . Cirrhosis of  liver without mention of alcohol   . Hypercholesteremia   . Peripheral vascular disease   . Exertional dyspnea   . Pneumonia 10/07/11; 2006; 1980's  . Chronic bronchitis     "since childhood" (12/06/2012)  . Type II diabetes mellitus   . Iron deficiency anemia   . External bleeding hemorrhoids   . Sinus headache   . Arthritis     "psorasic"  . OA (osteoarthritis)   . DJD (degenerative joint disease) of knee     "both" (12/06/2012)  . Carpal tunnel syndrome on right     . Anxiety   . Barrett esophagus   . Non-healing ulcer of foot, secondary to diabetes and PVD 12/07/2012  . PVD (peripheral vascular disease) 12/07/2012  . Tobacco abuse 12/07/2012  . Critical lower limb ischemia, with ulcer Lt foot 12/07/2012  . Gout   . COPD (chronic obstructive pulmonary disease)   . Carotid artery disease     dopplers showed mod L ICA stenosis   . PAD (peripheral artery disease)   . History of nuclear stress test 11/2012    lexiscan; normal, low risk   . Dermatophytosis of nail [id number]  . Psoriasis   . GI bleed   . Diverticulosis     Current Outpatient Prescriptions  Medication Sig Dispense Refill  . albuterol (PROVENTIL HFA;VENTOLIN HFA) 108 (90 BASE) MCG/ACT inhaler Inhale 1 puff into the lungs every 6 (six) hours as needed. FOR SHORTNESS OF BREATH      . ALPRAZolam (XANAX) 0.5 MG tablet Take 0.25 mg by mouth 2 (two) times daily as needed for anxiety or sleep.      Marland Kitchen ammonium lactate (LAC-HYDRIN) 12 % lotion Apply 1 application topically daily as needed for dry skin (psorias).       Marland Kitchen aspirin 81 MG tablet Take 81 mg by mouth daily.      . Brinzolamide-Brimonidine (SIMBRINZA) 1-0.2 % SUSP Place 1 drop into the right eye 3 (three) times daily.       . carvedilol (COREG) 3.125 MG tablet Take 1 tablet (3.125 mg total) by mouth 2 (two) times daily with a meal.  60 tablet  6  . esomeprazole (NEXIUM) 40 MG capsule Take 40 mg by mouth daily before breakfast.      . feeding supplement, GLUCERNA SHAKE, (GLUCERNA SHAKE) LIQD Take 237 mLs by mouth daily.       . ferrous sulfate 325 (65 FE) MG tablet Take 1 tablet (325 mg total) by mouth 2 (two) times daily with a meal.  60 tablet  2  . fluticasone (FLONASE) 50 MCG/ACT nasal spray Place 1 spray into both nostrils at bedtime as needed for allergies.       . Fluticasone-Salmeterol (ADVAIR HFA IN) Inhale into the lungs. 2 puffs in am ,1 puff in evening      . furosemide (LASIX) 40 MG tablet Take 1 tablet (40 mg total) by mouth  daily.  30 tablet    . ipratropium-albuterol (DUONEB) 0.5-2.5 (3) MG/3ML SOLN Take 3 mLs by nebulization every 4 (four) hours as needed (shortness of breath).      . Liniments ([employer] ARTHRITIS PAIN RELIEVER EX) Apply 1 application topically daily as needed (for shoulder pains).      . metFORMIN (GLUCOPHAGE) 500 MG tablet Take 500 mg by mouth 2 (two) times daily with a meal.      . Multiple Vitamins-Minerals (ICAPS) CAPS Take 1 capsule by mouth daily.      . predniSONE (DELTASONE) 5  MG tablet Take 5 mg by mouth every Monday, Wednesday, and Friday.       . ranitidine (ZANTAC) 150 MG capsule Take 150 mg by mouth at bedtime.       . RESTASIS 0.05 % ophthalmic emulsion Place 1 drop into both eyes 2 (two) times daily.       . simethicone (MYLICON) 80 MG chewable tablet Chew 1 tablet (80 mg total) by mouth 4 (four) times daily as needed for flatulence.  30 tablet  0  . ticagrelor (BRILINTA) 90 MG TABS tablet Take 1 tablet (90 mg total) by mouth 2 (two) times daily.  180 tablet  3  . Travoprost, BAK Free, (TRAVATAMN) 0.004 % SOLN ophthalmic solution Place 1 drop into both eyes at bedtime.        No current facility-administered medications for this visit.    Allergies:    Allergies  Allergen Reactions  . Amlodipine Besy-Benazepril Hcl Other (See Comments)    Nervous/shakiness  . Statins Other (See Comments)    "can't take any of them; cramp me up" (12/06/2012)  . Tradjenta [Linagliptin] Other (See Comments)    Extreme muscle pains, chest and back pains  . Plavix [Clopidogrel] Rash    Severe burning of the skin.Pt states that the doctor told her that her rash was psoriasis and not related to plavix but she states the rash came from the plavix  . Raptiva [Efalizumab] Rash    Social History:  The patient  reports that she has quit smoking. Her smoking use included Cigarettes. She has a 40 pack-year smoking history. She quit smokeless tobacco use about 10 months ago. She reports that she does not drink  alcohol or use illicit drugs.   Family history:   Family History  Problem Relation Age of Onset  . Lupus Sister     x2  . Lung cancer Sister   . Thyroid disease Sister   . Colon cancer Neg Hx   . Cancer Mother   . Heart Problems Maternal Grandmother   . Cancer Paternal Grandfather   . Diabetes Brother     x2  . Arthritis Brother     x3  . Arthritis Sister     x3    ROS:  Please see the history of present illness.  All other systems reviewed and negative.   PHYSICAL EXAM: VS:  BP 110/80  Pulse 76  Ht 5\' 5"  (1.651 m)  Wt 160 lb 9.6 oz (72.848 kg)  BMI 26.73 kg/m2 Obese, well developed, in no acute distress HEENT: Pupils are equal round react to light accommodation extraocular movements are intact.  Neck: no JVDNo cervical lymphadenopathy. Cardiac: Regular rate and rhythm without murmurs rubs or gallops. Lungs:  clear to auscultation bilaterally, no wheezing, rhonchi or rales Abd: soft, nontender, positive bowel sounds all quadrants, no hepatosplenomegaly Ext: no lower extremity edema.  2+ radial and 1+ dorsalis pedis pulses. Skin: warm and dry Neuro:  Grossly normal  EKG:  None   ASSESSMENT AND PLAN:  Problem List Items Addressed This Visit   ANEMIA, IRON DEFICIENCY - Primary      Hgb stable  CBC    Component Value Date/Time   WBC 6.7 07/14/2014 1201   RBC 3.36* 07/14/2014 1201   RBC 2.87* 05/09/2014 1934   HGB 9.5* 07/14/2014 1201   HCT 29.2* 07/14/2014 1201   PLT 186 07/14/2014 1201   MCV 86.9 07/14/2014 1201   MCH 28.3 07/14/2014 1201   MCHC 32.5 07/14/2014 1201  RDW 17.2* 07/14/2014 1201   LYMPHSABS 0.6* 05/09/2014 1320   MONOABS 0.4 05/09/2014 1320   EOSABS 0.1 05/09/2014 1320   BASOSABS 0.0 05/09/2014 1320        Cardiomyopathy, ischemic, EF 35-40%     Appears euvolemic.  Continue lasix    Anemia     Will review labs after received from PCP    SOB (shortness of breath)     Improved after nebs at home.  No changes    Pericardial effusion     Will  reassess with limited echo in three months    Relevant Orders      2D Echocardiogram without contrast

## 2014-08-01 NOTE — Assessment & Plan Note (Signed)
Will reassess with limited echo in three months

## 2014-08-01 NOTE — Assessment & Plan Note (Signed)
Improved after nebs at home.  No changes

## 2014-08-01 NOTE — Assessment & Plan Note (Signed)
Will review labs after received from PCP

## 2014-08-01 NOTE — Patient Instructions (Addendum)
1.  Follow up with Dr. Gwenlyn Found in 3 months. 2.  Repeat echo in 3 months to assess pericardial effusion.([place] physician has requested that you have an echocardiogram. Echocardiography is a painless test that uses sound waves to create images of your heart. It provides your doctor with information about the size and shape of your heart and how well your heart's chambers and valves are working. This procedure takes approximately one hour. There are no restrictions for this procedure.

## 2014-08-01 NOTE — Assessment & Plan Note (Signed)
Hgb stable  CBC    Component Value Date/Time   WBC 6.7 07/14/2014 1201   RBC 3.36* 07/14/2014 1201   RBC 2.87* 05/09/2014 1934   HGB 9.5* 07/14/2014 1201   HCT 29.2* 07/14/2014 1201   PLT 186 07/14/2014 1201   MCV 86.9 07/14/2014 1201   MCH 28.3 07/14/2014 1201   MCHC 32.5 07/14/2014 1201   RDW 17.2* 07/14/2014 1201   LYMPHSABS 0.6* 05/09/2014 1320   MONOABS 0.4 05/09/2014 1320   EOSABS 0.1 05/09/2014 1320   BASOSABS 0.0 05/09/2014 1320

## 2014-08-01 NOTE — Assessment & Plan Note (Signed)
No angina.  On asa, brilinta, coreg.  Some SOB improved after nebs.  No changes to meds.

## 2014-08-01 NOTE — Assessment & Plan Note (Signed)
I reviewed her BP diary and it is well controlled.  No changes

## 2014-08-01 NOTE — Assessment & Plan Note (Signed)
Appears euvolemic. Continue lasix.  ?

## 2014-08-26 ENCOUNTER — Telehealth: Payer: Self-pay | Admitting: Hematology and Oncology

## 2014-08-26 NOTE — Telephone Encounter (Signed)
Called Dr. Oralia Rud office. I requested pt's medical records so I can schedule np appt. The referring office said they will fax records.

## 2014-08-28 ENCOUNTER — Telehealth: Payer: Self-pay | Admitting: Hematology and Oncology

## 2014-08-28 NOTE — Telephone Encounter (Signed)
CALLED DR KILPATRICK'S OFFICE AGAIN LVM IN REF TO NEEDING RECORDS IN ORDER TO SCHEDULE PT APPT.

## 2014-09-01 ENCOUNTER — Telehealth: Payer: Self-pay | Admitting: Hematology and Oncology

## 2014-09-01 NOTE — Telephone Encounter (Signed)
S/W PT IN REF TO NP APPT. ON 09/15/14@10 :00 REFERRING DR Katherine Roan DX-SEVERE ANEMIA

## 2014-09-15 ENCOUNTER — Ambulatory Visit: Payer: Medicare Other

## 2014-09-15 ENCOUNTER — Ambulatory Visit (HOSPITAL_BASED_OUTPATIENT_CLINIC_OR_DEPARTMENT_OTHER): Payer: Medicare Other

## 2014-09-15 ENCOUNTER — Encounter: Payer: Self-pay | Admitting: Hematology and Oncology

## 2014-09-15 ENCOUNTER — Telehealth: Payer: Self-pay | Admitting: Hematology and Oncology

## 2014-09-15 ENCOUNTER — Other Ambulatory Visit: Payer: Self-pay

## 2014-09-15 ENCOUNTER — Ambulatory Visit (HOSPITAL_BASED_OUTPATIENT_CLINIC_OR_DEPARTMENT_OTHER): Payer: Medicare Other | Admitting: Hematology and Oncology

## 2014-09-15 VITALS — BP 145/50 | HR 68 | Temp 97.8°F | Resp 20 | Ht 65.0 in | Wt 166.4 lb

## 2014-09-15 DIAGNOSIS — D539 Nutritional anemia, unspecified: Secondary | ICD-10-CM

## 2014-09-15 DIAGNOSIS — L4050 Arthropathic psoriasis, unspecified: Secondary | ICD-10-CM

## 2014-09-15 DIAGNOSIS — D509 Iron deficiency anemia, unspecified: Secondary | ICD-10-CM

## 2014-09-15 LAB — IRON AND TIBC CHCC
%SAT: 34 % (ref 21–57)
Iron: 107 ug/dL (ref 41–142)
TIBC: 319 ug/dL (ref 236–444)
UIBC: 212 ug/dL (ref 120–384)

## 2014-09-15 LAB — CBC & DIFF AND RETIC
BASO%: 0.2 % (ref 0.0–2.0)
Basophils Absolute: 0 10*3/uL (ref 0.0–0.1)
EOS%: 3 % (ref 0.0–7.0)
Eosinophils Absolute: 0.3 10*3/uL (ref 0.0–0.5)
HEMATOCRIT: 29.8 % — AB (ref 34.8–46.6)
HGB: 9.4 g/dL — ABNORMAL LOW (ref 11.6–15.9)
IMMATURE RETIC FRACT: 14.7 % — AB (ref 1.60–10.00)
LYMPH%: 14.1 % (ref 14.0–49.7)
MCH: 29.2 pg (ref 25.1–34.0)
MCHC: 31.5 g/dL (ref 31.5–36.0)
MCV: 92.5 fL (ref 79.5–101.0)
MONO#: 0.5 10*3/uL (ref 0.1–0.9)
MONO%: 5.4 % (ref 0.0–14.0)
NEUT#: 6.6 10*3/uL — ABNORMAL HIGH (ref 1.5–6.5)
NEUT%: 77.3 % — ABNORMAL HIGH (ref 38.4–76.8)
Platelets: 189 10*3/uL (ref 145–400)
RBC: 3.22 10*6/uL — ABNORMAL LOW (ref 3.70–5.45)
RDW: 14.8 % — ABNORMAL HIGH (ref 11.2–14.5)
Retic %: 5.8 % — ABNORMAL HIGH (ref 0.70–2.10)
Retic Ct Abs: 186.76 10*3/uL — ABNORMAL HIGH (ref 33.70–90.70)
WBC: 8.5 10*3/uL (ref 3.9–10.3)
lymph#: 1.2 10*3/uL (ref 0.9–3.3)
nRBC: 0 % (ref 0–0)

## 2014-09-15 LAB — HOLD TUBE, BLOOD BANK

## 2014-09-15 LAB — FERRITIN CHCC: Ferritin: 40 ng/ml (ref 9–269)

## 2014-09-15 LAB — CHCC SMEAR

## 2014-09-15 MED ORDER — CARVEDILOL 3.125 MG PO TABS: 3.1250 mg | ORAL_TABLET | Freq: Two times a day (BID) | ORAL | Status: DC

## 2014-09-15 NOTE — Progress Notes (Signed)
Checked in new patient with no financial issues prior to seeing the dr. She has appt card and has not been traveling

## 2014-09-15 NOTE — Telephone Encounter (Signed)
Rx was sent to pharmacy electronically. 

## 2014-09-15 NOTE — Patient Instructions (Signed)

## 2014-09-15 NOTE — Assessment & Plan Note (Signed)
I believe the cause of her anemia is multifactorial, likely component of anemia chronic disease and iron deficiency. Stat CBC today confirmed that her anemia is not severe to the point she needs blood transfusion. The most likely cause of her anemia is due to chronic blood loss/malabsorption syndrome. We discussed some of the risks, benefits, and alternatives of intravenous iron infusions. The patient is symptomatic from anemia and the iron level is critically low. She tolerated oral iron supplement poorly and desires to achieved higher levels of iron faster for adequate hematopoesis. Some of the side-effects to be expected including risks of infusion reactions, phlebitis, headaches, nausea and fatigue.  The patient is willing to proceed. Patient education material was dispensed.  Goal is to keep ferritin level greater than 50 I told her to discontinue oral iron supplement for now. After iron infusion this week, I will recheck it within a month and to determine whether she will benefit from either a second iron infusion or erythropoietin stimulating agents.

## 2014-09-15 NOTE — Progress Notes (Signed)
[place] NOTE  Patient Care Team: Leola Brazil., MD as PCP - General (Pulmonary Disease)  CHIEF COMPLAINTS/PURPOSE OF CONSULTATION:  Chronic anemia  HISTORY OF PRESENTING ILLNESS:  [name] 73 y.o. female is here because of chronic anemia She was found to have abnormal CBC from routine blood work. In 2012, she has normal hemoglobin. Since November 2012 until present time, she has recurrence of anemia requiring blood transfusion, with her blood counts as close to 6.2. She was placed on oral iron supplement for over a year. She also carries a diagnosis of autoimmune psoriatic arthritis and was placed on prednisone in the past. She denies recent chest pain on exertion, pre-syncopal episodes, or palpitations. She has mild weakness since the recent heart attack and shortness of breath on minimal exertion. She had not noticed any recent bleeding such as epistaxis, hematuria or hematochezia The patient denies over the counter NSAID ingestion. She is chronic on antiplatelets agents. Her last colonoscopy was several months ago with virtual colonoscopy for positive fecal occult blood. Prior endoscopy in June 2011 revealed significant esophagitis. It was repeated again in April 2015 which was unremarkable.  She had no prior history or diagnosis of cancer. Her age appropriate screening programs are up-to-date. She denies any pica and eats a variety of diet. She never donated blood. The patient was prescribed oral iron supplements and she takes one tablet twice a day with breakfast and dinner. It is causing some abdominal bloating and constipation.  MEDICAL HISTORY:  Past Medical History  Diagnosis Date  . COPD (chronic obstructive pulmonary disease)   . Hypertension   . Asthma   . Psoriatic arthritis   . Psoriasis   . GERD (gastroesophageal reflux disease)   . Hiatal hernia   . Chronic sinus infection   . Heart murmur   . Blood transfusion ~ 1962   "11 units of blood"  . Ankylosing spondylitis   . Chronic back pain   . Dysrhythmia     "palpitations"  . Esophageal dilatation   . Candida esophagitis   . Cirrhosis of liver without mention of alcohol   . Hypercholesteremia   . Peripheral vascular disease   . Exertional dyspnea   . Pneumonia 10/07/11; 2006; 1980's  . Chronic bronchitis     "since childhood" (12/06/2012)  . Type II diabetes mellitus   . Iron deficiency anemia   . External bleeding hemorrhoids   . Sinus headache   . Arthritis     "psorasic"  . OA (osteoarthritis)   . DJD (degenerative joint disease) of knee     "both" (12/06/2012)  . Carpal tunnel syndrome on right   . Anxiety   . Barrett esophagus   . Non-healing ulcer of foot, secondary to diabetes and PVD 12/07/2012  . PVD (peripheral vascular disease) 12/07/2012  . Tobacco abuse 12/07/2012  . Critical lower limb ischemia, with ulcer Lt foot 12/07/2012  . Gout   . COPD (chronic obstructive pulmonary disease)   . Carotid artery disease     dopplers showed mod L ICA stenosis   . PAD (peripheral artery disease)   . History of nuclear stress test 11/2012    lexiscan; normal, low risk   . Dermatophytosis of nail [id number]  . Psoriasis   . GI bleed   . Diverticulosis     SURGICAL HISTORY: Past Surgical History  Procedure Laterality Date  . Nasal sinus surgery  09/29/2009  . Carpal tunnel release  2011  left hand  . Lipoma excision  2010    "left occipital area"  . Cataract extraction w/ intraocular lens implant  2009/2010    right/left  . Eye surgery    . Oophorectomy      'not sure which one"  . Appendectomy    . Angioplasty  12/10/2012    "LLE"; LSFA [place] orbital rotational arthrectomy, PTA , stenting with IDEV stent (left ABI improved from 0.35 to 0.75)  . Abdominal hysterectomy  1976  . Transmetatarsal amputation Left 01/08/2013    Procedure: TRANSMETATARSAL AMPUTATION;  Surgeon: Angelia Mould, MD;  Location: [facility];  Service: Vascular;   Laterality: Left;  . Esophagogastroduodenoscopy  2011  . Colonoscopy  2007    Hemorrhoids and Diverticulosis  . Esophagogastroduodenoscopy N/A 03/17/2014    Procedure: ESOPHAGOGASTRODUODENOSCOPY (EGD);  Surgeon: Irene Shipper, MD;  Location: [facility] ENDOSCOPY;  Service: Endoscopy;  Laterality: N/A;    SOCIAL HISTORY: History   Social History  . Marital Status: Widowed    Spouse Name: N/A    Number of Children: 0  . Years of Education: N/A   Occupational History  . 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 Smoking status: Former Smoker -- 1.00 packs/day for 40 years    Types: Cigarettes  . Smokeless tobacco: Former Systems developer    Quit date: 09/28/2013     Comment: pt states that she smokes "every once in a while"  . Alcohol Use: No     Comment: "quit social drinking years ago"  . Drug Use: No  . Sexual Activity: No   Other Topics Concern  . Not on file   Social History Narrative  . No narrative on file    FAMILY HISTORY: Family History  Problem Relation Age of Onset  . Lupus Sister     x2  . Lung cancer Sister   . Thyroid disease Sister   . Colon cancer Neg Hx   . Cancer Mother   . Heart Problems Maternal Grandmother   . Cancer Paternal Grandfather   . Diabetes Brother     x2  . Arthritis Brother     x3  . Arthritis Sister     x3    ALLERGIES:  is allergic to amlodipine besy-benazepril hcl; statins; tradjenta; plavix; and raptiva.  MEDICATIONS:  Current Outpatient Prescriptions  Medication Sig Dispense Refill  . albuterol (PROVENTIL HFA;VENTOLIN HFA) 108 (90 BASE) MCG/ACT inhaler Inhale 1 puff into the lungs every 6 (six) hours as needed. FOR SHORTNESS OF BREATH      . ALPRAZolam (XANAX) 0.5 MG tablet Take 0.25 mg by mouth 2 (two) times daily as needed for anxiety or sleep.      Marland Kitchen ammonium lactate (LAC-HYDRIN) 12 % lotion Apply 1 application topically daily as needed for dry skin (psorias).       Marland Kitchen aspirin 81 MG tablet Take 81 mg by  mouth daily.      . Brinzolamide-Brimonidine (SIMBRINZA) 1-0.2 % SUSP Place 1 drop into the right eye 3 (three) times daily.       . carvedilol (COREG) 3.125 MG tablet Take 1 tablet (3.125 mg total) by mouth 2 (two) times daily with a meal.  60 tablet  6  . esomeprazole (NEXIUM) 40 MG capsule Take 40 mg by mouth daily before breakfast.      . feeding supplement, GLUCERNA SHAKE, (GLUCERNA SHAKE) LIQD Take 237 mLs by mouth daily.       Marland Kitchen  ferrous sulfate 325 (65 FE) MG tablet Take 1 tablet (325 mg total) by mouth 2 (two) times daily with a meal.  60 tablet  2  . fluticasone (FLONASE) 50 MCG/ACT nasal spray Place 1 spray into both nostrils at bedtime as needed for allergies.       . Fluticasone-Salmeterol (ADVAIR HFA IN) Inhale into the lungs. 2 puffs in am ,1 puff in evening      . furosemide (LASIX) 40 MG tablet Take 1 tablet (40 mg total) by mouth daily.  30 tablet    . ipratropium-albuterol (DUONEB) 0.5-2.5 (3) MG/3ML SOLN Take 3 mLs by nebulization every 4 (four) hours as needed (shortness of breath).      . Liniments ([employer] ARTHRITIS PAIN RELIEVER EX) Apply 1 application topically daily as needed (for shoulder pains).      . metFORMIN (GLUCOPHAGE) 500 MG tablet Take 500 mg by mouth 2 (two) times daily with a meal.      . Multiple Vitamins-Minerals (ICAPS) CAPS Take 1 capsule by mouth daily.      . predniSONE (DELTASONE) 5 MG tablet Take 5 mg by mouth every Monday, Wednesday, and Friday.       . ranitidine (ZANTAC) 150 MG capsule Take 150 mg by mouth at bedtime.       . RESTASIS 0.05 % ophthalmic emulsion Place 1 drop into both eyes 2 (two) times daily.       . simethicone (MYLICON) 80 MG chewable tablet Chew 1 tablet (80 mg total) by mouth 4 (four) times daily as needed for flatulence.  30 tablet  0  . ticagrelor (BRILINTA) 90 MG TABS tablet Take 1 tablet (90 mg total) by mouth 2 (two) times daily.  180 tablet  3  . Travoprost, BAK Free, (TRAVATAMN) 0.004 % SOLN ophthalmic solution Place 1 drop  into both eyes at bedtime.        No current facility-administered medications for this visit.    REVIEW OF SYSTEMS:   Constitutional: Denies fevers, chills or abnormal night sweats Eyes: Denies blurriness of vision, double vision or watery eyes Ears, nose, mouth, throat, and face: Denies mucositis or sore throat Respiratory: Denies cough, dyspnea or wheezes Gastrointestinal:  Denies nausea, heartburn or change in bowel habits Lymphatics: Denies new lymphadenopathy or easy bruising Neurological:Denies numbness, tingling or new weaknesses Behavioral/Psych: Mood is stable, no new changes  All other systems were reviewed with the patient and are negative.  PHYSICAL EXAMINATION: ECOG PERFORMANCE STATUS: 1 - Symptomatic but completely ambulatory  Filed Vitals:   09/15/14 1011  BP: 145/50  Pulse: 68  Temp: 97.8 F (36.6 C)  Resp: 20   Filed Weights   09/15/14 1011  Weight: 166 lb 6.4 oz (75.479 kg)    GENERAL:alert, no distress and comfortable SKIN: skin color, texture, turgor are normal, no rashes or significant lesions. Psoriatic plaques were noted. EYES: normal, conjunctiva are pale and non-injected, sclera clear OROPHARYNX:no exudate, no erythema and lips, buccal mucosa, and tongue normal  NECK: supple, thyroid normal size, non-tender, without nodularity LYMPH:  no palpable lymphadenopathy in the cervical, axillary or inguinal LUNGS: clear to auscultation and percussion with normal breathing effort HEART: regular rate & rhythm and no murmurs and no lower extremity edema ABDOMEN:abdomen soft, non-tender and normal bowel sounds Musculoskeletal:no cyanosis of digits and no clubbing . Degenerative joint arthritis were noted PSYCH: alert & oriented x 3 with fluent speech NEURO: no focal motor/sensory deficits  LABORATORY DATA:  I have reviewed the data as listed  Recent Results (from the past 2160 hour(s))  BASIC METABOLIC PANEL WITH GFR     Status: Abnormal   Collection Time     07/07/14 11:17 AM      Result Value Ref Range   Sodium 139  135 - 145 mEq/L   Potassium 4.8  3.5 - 5.3 mEq/L   Chloride 97  96 - 112 mEq/L   CO2 28  19 - 32 mEq/L   Glucose, Bld 242 (*) 70 - 99 mg/dL   BUN 31 (*) 6 - 23 mg/dL   Creat 1.20 (*) 0.50 - 1.10 mg/dL   Calcium 10.3  8.4 - 10.5 mg/dL  CBC     Status: Abnormal   Collection Time    07/14/14 12:01 PM      Result Value Ref Range   WBC 6.7  4.0 - 10.5 K/uL   RBC 3.36 (*) 3.87 - 5.11 MIL/uL   Hemoglobin 9.5 (*) 12.0 - 15.0 g/dL   HCT 29.2 (*) 36.0 - 46.0 %   MCV 86.9  78.0 - 100.0 fL   MCH 28.3  26.0 - 34.0 pg   MCHC 32.5  30.0 - 36.0 g/dL   RDW 17.2 (*) 11.5 - 15.5 %   Platelets 186  150 - 400 K/uL  HOLD TUBE, BLOOD BANK     Status: None   Collection Time    09/15/14 11:05 AM      Result Value Ref Range   Hold Tube, Blood Bank Blood Bank Order Cancelled    [facility] SMEAR     Status: None   Collection Time    09/15/14 11:05 AM      Result Value Ref Range   Smear Result Smear Available    CBC & DIFF AND RETIC     Status: Abnormal   Collection Time    09/15/14 11:05 AM      Result Value Ref Range   WBC 8.5  3.9 - 10.3 10e3/uL   NEUT# 6.6 (*) 1.5 - 6.5 10e3/uL   HGB 9.4 (*) 11.6 - 15.9 g/dL   HCT 29.8 (*) 34.8 - 46.6 %   Platelets 189  145 - 400 10e3/uL   MCV 92.5  79.5 - 101.0 fL   MCH 29.2  25.1 - 34.0 pg   MCHC 31.5  31.5 - 36.0 g/dL   RBC 3.22 (*) 3.70 - 5.45 10e6/uL   RDW 14.8 (*) 11.2 - 14.5 %   lymph# 1.2  0.9 - 3.3 10e3/uL   MONO# 0.5  0.1 - 0.9 10e3/uL   Eosinophils Absolute 0.3  0.0 - 0.5 10e3/uL   Basophils Absolute 0.0  0.0 - 0.1 10e3/uL   NEUT% 77.3 (*) 38.4 - 76.8 %   LYMPH% 14.1  14.0 - 49.7 %   MONO% 5.4  0.0 - 14.0 %   EOS% 3.0  0.0 - 7.0 %   BASO% 0.2  0.0 - 2.0 %   nRBC 0  0 - 0 %   Retic % 5.80 (*) 0.70 - 2.10 %   Retic Ct Abs 186.76 (*) 33.70 - 90.70 10e3/uL   Immature Retic Fract 14.70 (*) 1.60 - 10.00 %   ASSESSMENT & PLAN:  Deficiency anemia I believe the cause of her anemia is  multifactorial, likely component of anemia chronic disease and iron deficiency. Stat CBC today confirmed that her anemia is not severe to the point she needs blood transfusion. The most likely cause of her anemia is due to chronic blood loss/malabsorption  syndrome. We discussed some of the risks, benefits, and alternatives of intravenous iron infusions. The patient is symptomatic from anemia and the iron level is critically low. She tolerated oral iron supplement poorly and desires to achieved higher levels of iron faster for adequate hematopoesis. Some of the side-effects to be expected including risks of infusion reactions, phlebitis, headaches, nausea and fatigue.  The patient is willing to proceed. Patient education material was dispensed.  Goal is to keep ferritin level greater than 50 I told her to discontinue oral iron supplement for now. After iron infusion this week, I will recheck it within a month and to determine whether she will benefit from either a second iron infusion or erythropoietin stimulating agents.    All questions were answered. The patient knows to call the clinic with any problems, questions or concerns. I spent 40 minutes counseling the patient face to face. The total time spent in the appointment was 50 minutes and more than 50% was on counseling.     [place], [place], MD 09/15/2014 11:54 AM

## 2014-09-15 NOTE — Telephone Encounter (Signed)
gv and printed appt sched and avs for pt for OCT and DEC...sent pt to lab

## 2014-09-16 ENCOUNTER — Other Ambulatory Visit: Payer: Self-pay

## 2014-09-16 MED ORDER — CARVEDILOL 3.125 MG PO TABS: 3.1250 mg | ORAL_TABLET | Freq: Two times a day (BID) | ORAL | Status: DC

## 2014-09-16 NOTE — Telephone Encounter (Signed)
Rx was sent to pharmacy electronically. 

## 2014-09-16 NOTE — Telephone Encounter (Signed)
Rx sent to pharmacy   

## 2014-09-17 ENCOUNTER — Ambulatory Visit (INDEPENDENT_AMBULATORY_CARE_PROVIDER_SITE_OTHER): Payer: Medicare Other | Admitting: Podiatry

## 2014-09-17 ENCOUNTER — Encounter: Payer: Self-pay | Admitting: Podiatry

## 2014-09-17 VITALS — BP 149/86 | HR 89 | Resp 12

## 2014-09-17 DIAGNOSIS — B351 Tinea unguium: Secondary | ICD-10-CM

## 2014-09-17 DIAGNOSIS — M79671 Pain in right foot: Secondary | ICD-10-CM

## 2014-09-17 LAB — ERYTHROPOIETIN: ERYTHROPOIETIN: 53.4 m[IU]/mL — AB (ref 2.6–18.5)

## 2014-09-17 LAB — SEDIMENTATION RATE: SED RATE: 15 mm/h (ref 0–22)

## 2014-09-17 NOTE — Progress Notes (Signed)
Patient ID: [name], female   DOB: 10/03/1941, 73 y.o.   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

## 2014-09-19 ENCOUNTER — Ambulatory Visit (HOSPITAL_BASED_OUTPATIENT_CLINIC_OR_DEPARTMENT_OTHER): Payer: Medicare Other

## 2014-09-19 VITALS — BP 136/51 | HR 72 | Temp 98.2°F | Resp 16

## 2014-09-19 DIAGNOSIS — D509 Iron deficiency anemia, unspecified: Secondary | ICD-10-CM

## 2014-09-19 DIAGNOSIS — D539 Nutritional anemia, unspecified: Secondary | ICD-10-CM

## 2014-09-19 MED ORDER — SODIUM CHLORIDE 0.9 % IV SOLN
Freq: Once | INTRAVENOUS | Status: AC
  Administered 2014-09-19: 10:00:00 via INTRAVENOUS

## 2014-09-19 MED ORDER — SODIUM CHLORIDE 0.9 % IV SOLN
1020.0000 mg | Freq: Once | INTRAVENOUS | Status: AC
  Administered 2014-09-19: 1020 mg via INTRAVENOUS
  Filled 2014-09-19: qty 34

## 2014-09-19 NOTE — Patient Instructions (Signed)

## 2014-09-25 ENCOUNTER — Other Ambulatory Visit (HOSPITAL_COMMUNITY): Payer: Self-pay | Admitting: Pulmonary Disease

## 2014-09-25 DIAGNOSIS — R1310 Dysphagia, unspecified: Secondary | ICD-10-CM

## 2014-10-02 ENCOUNTER — Other Ambulatory Visit (HOSPITAL_COMMUNITY): Payer: Self-pay | Admitting: Pulmonary Disease

## 2014-10-02 ENCOUNTER — Ambulatory Visit (HOSPITAL_COMMUNITY)
Admission: RE | Admit: 2014-10-02 | Discharge: 2014-10-02 | Disposition: A | Discharge status: Home / Self Care | Payer: Medicare Other | Source: Ambulatory Visit | Attending: Pulmonary Disease | Admitting: Pulmonary Disease

## 2014-10-02 ENCOUNTER — Ambulatory Visit (HOSPITAL_COMMUNITY): Admission: RE | Admit: 2014-10-02 | Payer: Medicare Other | Source: Ambulatory Visit

## 2014-10-02 DIAGNOSIS — R1310 Dysphagia, unspecified: Secondary | ICD-10-CM

## 2014-10-02 DIAGNOSIS — N200 Calculus of kidney: Secondary | ICD-10-CM | POA: Insufficient documentation

## 2014-10-02 DIAGNOSIS — R4702 Dysphasia: Secondary | ICD-10-CM | POA: Insufficient documentation

## 2014-10-08 ENCOUNTER — Other Ambulatory Visit: Payer: Self-pay | Admitting: Hematology and Oncology

## 2014-10-13 ENCOUNTER — Telehealth: Payer: Self-pay | Admitting: Hematology and Oncology

## 2014-10-13 NOTE — Telephone Encounter (Signed)
returned pt call and r/s appt per pt request...pt ok and aware °

## 2014-10-15 ENCOUNTER — Other Ambulatory Visit: Payer: Self-pay | Admitting: Hematology and Oncology

## 2014-10-15 ENCOUNTER — Other Ambulatory Visit (HOSPITAL_BASED_OUTPATIENT_CLINIC_OR_DEPARTMENT_OTHER): Payer: Medicare Other

## 2014-10-15 ENCOUNTER — Telehealth: Payer: Self-pay | Admitting: Hematology and Oncology

## 2014-10-15 DIAGNOSIS — D509 Iron deficiency anemia, unspecified: Secondary | ICD-10-CM

## 2014-10-15 DIAGNOSIS — L4050 Arthropathic psoriasis, unspecified: Secondary | ICD-10-CM

## 2014-10-15 LAB — FERRITIN CHCC: FERRITIN: 401 ng/mL — AB (ref 9–269)

## 2014-10-15 LAB — CBC & DIFF AND RETIC
BASO%: 0.3 % (ref 0.0–2.0)
BASOS ABS: 0 10*3/uL (ref 0.0–0.1)
EOS%: 3.4 % (ref 0.0–7.0)
Eosinophils Absolute: 0.3 10*3/uL (ref 0.0–0.5)
HCT: 29.3 % — ABNORMAL LOW (ref 34.8–46.6)
HEMOGLOBIN: 9.1 g/dL — AB (ref 11.6–15.9)
Immature Retic Fract: 16.5 % — ABNORMAL HIGH (ref 1.60–10.00)
LYMPH%: 12.4 % — AB (ref 14.0–49.7)
MCH: 29.6 pg (ref 25.1–34.0)
MCHC: 31.1 g/dL — ABNORMAL LOW (ref 31.5–36.0)
MCV: 95.4 fL (ref 79.5–101.0)
MONO#: 0.6 10*3/uL (ref 0.1–0.9)
MONO%: 6.4 % (ref 0.0–14.0)
NEUT%: 77.5 % — ABNORMAL HIGH (ref 38.4–76.8)
NEUTROS ABS: 7.7 10*3/uL — AB (ref 1.5–6.5)
PLATELETS: 189 10*3/uL (ref 145–400)
RBC: 3.07 10*6/uL — AB (ref 3.70–5.45)
RDW: 15.7 % — AB (ref 11.2–14.5)
RETIC CT ABS: 156.88 10*3/uL — AB (ref 33.70–90.70)
Retic %: 5.11 % — ABNORMAL HIGH (ref 0.70–2.10)
WBC: 10 10*3/uL (ref 3.9–10.3)
lymph#: 1.2 10*3/uL (ref 0.9–3.3)

## 2014-10-15 LAB — CHCC SMEAR

## 2014-10-15 LAB — HOLD TUBE, BLOOD BANK

## 2014-10-15 LAB — SEDIMENTATION RATE: Sed Rate: 42 mm/hr — ABNORMAL HIGH (ref 0–22)

## 2014-10-15 NOTE — Telephone Encounter (Signed)
added inj per MD staff....per staff no need to contact pt

## 2014-10-16 ENCOUNTER — Other Ambulatory Visit: Payer: Medicare Other

## 2014-10-28 ENCOUNTER — Telehealth: Payer: Self-pay | Admitting: Hematology and Oncology

## 2014-10-28 ENCOUNTER — Ambulatory Visit (HOSPITAL_BASED_OUTPATIENT_CLINIC_OR_DEPARTMENT_OTHER): Payer: Medicare Other | Admitting: Hematology and Oncology

## 2014-10-28 ENCOUNTER — Ambulatory Visit (HOSPITAL_BASED_OUTPATIENT_CLINIC_OR_DEPARTMENT_OTHER): Payer: Medicare Other

## 2014-10-28 ENCOUNTER — Other Ambulatory Visit: Payer: Self-pay | Admitting: Hematology and Oncology

## 2014-10-28 VITALS — BP 137/49 | HR 78 | Temp 98.3°F | Resp 18 | Ht 65.0 in | Wt 170.3 lb

## 2014-10-28 DIAGNOSIS — D509 Iron deficiency anemia, unspecified: Secondary | ICD-10-CM

## 2014-10-28 DIAGNOSIS — D539 Nutritional anemia, unspecified: Secondary | ICD-10-CM

## 2014-10-28 DIAGNOSIS — D649 Anemia, unspecified: Secondary | ICD-10-CM

## 2014-10-28 DIAGNOSIS — L4050 Arthropathic psoriasis, unspecified: Secondary | ICD-10-CM

## 2014-10-28 LAB — CBC & DIFF AND RETIC
BASO%: 0.3 % (ref 0.0–2.0)
BASOS ABS: 0 10*3/uL (ref 0.0–0.1)
EOS%: 2.6 % (ref 0.0–7.0)
Eosinophils Absolute: 0.3 10*3/uL (ref 0.0–0.5)
HCT: 28.1 % — ABNORMAL LOW (ref 34.8–46.6)
HGB: 8.7 g/dL — ABNORMAL LOW (ref 11.6–15.9)
Immature Retic Fract: 26 % — ABNORMAL HIGH (ref 1.60–10.00)
LYMPH%: 14.9 % (ref 14.0–49.7)
MCH: 29.7 pg (ref 25.1–34.0)
MCHC: 31 g/dL — ABNORMAL LOW (ref 31.5–36.0)
MCV: 95.9 fL (ref 79.5–101.0)
MONO#: 0.7 10*3/uL (ref 0.1–0.9)
MONO%: 7.3 % (ref 0.0–14.0)
NEUT#: 7.5 10*3/uL — ABNORMAL HIGH (ref 1.5–6.5)
NEUT%: 74.9 % (ref 38.4–76.8)
PLATELETS: 207 10*3/uL (ref 145–400)
RBC: 2.93 10*6/uL — ABNORMAL LOW (ref 3.70–5.45)
RDW: 15.9 % — ABNORMAL HIGH (ref 11.2–14.5)
RETIC %: 6.97 % — AB (ref 0.70–2.10)
Retic Ct Abs: 204.22 10*3/uL — ABNORMAL HIGH (ref 33.70–90.70)
WBC: 10 10*3/uL (ref 3.9–10.3)
lymph#: 1.5 10*3/uL (ref 0.9–3.3)

## 2014-10-28 LAB — CHCC SMEAR

## 2014-10-28 MED ORDER — DARBEPOETIN ALFA 200 MCG/0.4ML IJ SOSY
200.0000 ug | PREFILLED_SYRINGE | Freq: Once | INTRAMUSCULAR | Status: AC
  Administered 2014-10-28: 200 ug via SUBCUTANEOUS
  Filled 2014-10-28: qty 0.4

## 2014-10-28 NOTE — Progress Notes (Signed)
[facility] OFFICE PROGRESS NOTE  KILPATRICK Harding,Shelby R, MD SUMMARY OF HEMATOLOGIC HISTORY: She was found to have abnormal CBC from routine blood work. In 2012, she has normal hemoglobin. Since November 2012 until present time, she has recurrence of anemia requiring blood transfusion, with her blood counts as close to 6.2. She was placed on oral iron supplement for over a year. She also carries a diagnosis of autoimmune psoriatic arthritis and was placed on prednisone in the past. She denies recent chest pain on exertion, pre-syncopal episodes, or palpitations. She has mild weakness since the recent heart attack and shortness of breath on minimal exertion. Her last colonoscopy was several months ago in 2015 with virtual colonoscopy for positive fecal occult blood. Prior endoscopy in June 2011 revealed significant esophagitis. It was repeated again in April 2015 which was unremarkable.  The patient was prescribed oral iron supplements and she takes one tablet twice a day with breakfast and dinner. It is causing some abdominal bloating and constipation.  in October 2015, she was given dose of intravenous iron Feraheme without improvement of anemia. INTERVAL HISTORY: [name] 73 y.o. female returns for  Further follow-up. She denies improvement of her energy level. The patient denies any recent signs or symptoms of bleeding such as spontaneous epistaxis, hematuria or hematochezia.   I have reviewed the past medical history, past surgical history, social history and family history with the patient and they are unchanged from previous note.  ALLERGIES:  is allergic to amlodipine besy-benazepril hcl; statins; tradjenta; plavix; and raptiva.  MEDICATIONS:  Current Outpatient Prescriptions  Medication Sig Dispense Refill  . albuterol (PROVENTIL HFA;VENTOLIN HFA) 108 (90 BASE) MCG/ACT inhaler Inhale 1 puff into the lungs every 6 (six) hours as needed. FOR SHORTNESS OF BREATH     . ALPRAZolam (XANAX) 0.5 MG tablet Take 0.25 mg by mouth 2 (two) times daily as needed for anxiety or sleep.    Marland Kitchen ammonium lactate (LAC-HYDRIN) 12 % lotion Apply 1 application topically daily as needed for dry skin (psorias).     Marland Kitchen aspirin 81 MG tablet Take 81 mg by mouth daily.    . brimonidine (ALPHAGAN) 0.2 % ophthalmic solution   2  . carvedilol (COREG) 3.125 MG tablet Take 1 tablet (3.125 mg total) by mouth 2 (two) times daily with a meal. 180 tablet 3  . esomeprazole (NEXIUM) 40 MG capsule Take 40 mg by mouth daily before breakfast.    . feeding supplement, GLUCERNA SHAKE, (GLUCERNA SHAKE) LIQD Take 237 mLs by mouth daily.     . ferrous sulfate 325 (65 FE) MG tablet Take 1 tablet (325 mg total) by mouth 2 (two) times daily with a meal. 60 tablet 2  . fluticasone (FLONASE) 50 MCG/ACT nasal spray Place 1 spray into both nostrils at bedtime as needed for allergies.     . Fluticasone-Salmeterol (ADVAIR HFA IN) Inhale into the lungs. 2 puffs in am ,1 puff in evening    . furosemide (LASIX) 40 MG tablet Take 1 tablet (40 mg total) by mouth daily. 30 tablet   . glimepiride (AMARYL) 2 MG tablet     . ipratropium-albuterol (DUONEB) 0.5-2.5 (3) MG/3ML SOLN Take 3 mLs by nebulization every 4 (four) hours as needed (shortness of breath).    . Liniments ([employer] ARTHRITIS PAIN RELIEVER EX) Apply 1 application topically daily as needed (for shoulder pains).    . metFORMIN (GLUCOPHAGE) 500 MG tablet Take 500 mg by mouth 2 (two) times daily with a  meal.    . Multiple Vitamins-Minerals (ICAPS) CAPS Take 1 capsule by mouth daily.    Marland Kitchen nystatin (MYCOSTATIN) 100000 UNIT/ML suspension Use as directed 5 mLs in the mouth or throat every 8 (eight) hours as needed.   1  . predniSONE (DELTASONE) 5 MG tablet Take 5 mg by mouth every Monday, Wednesday, and Friday.     . ranitidine (ZANTAC) 150 MG capsule Take 150 mg by mouth at bedtime.     . simethicone (MYLICON) 80 MG chewable tablet Chew 1 tablet (80 mg total) by  mouth 4 (four) times daily as needed for flatulence. 30 tablet 0  . ticagrelor (BRILINTA) 90 MG TABS tablet Take 1 tablet (90 mg total) by mouth 2 (two) times daily. 180 tablet 3  . Travoprost, BAK Free, (TRAVATAMN) 0.004 % SOLN ophthalmic solution Place 1 drop into both eyes at bedtime.      No current facility-administered medications for this visit.     REVIEW OF SYSTEMS:   Constitutional: Denies fevers, chills or night sweats Eyes: Denies blurriness of vision Ears, nose, mouth, throat, and face: Denies mucositis or sore throat Respiratory: Denies cough, dyspnea or wheezes Cardiovascular: Denies palpitation, chest discomfort or lower extremity swelling Gastrointestinal:  Denies nausea, heartburn or change in bowel habits Skin: Denies abnormal skin rashes Lymphatics: Denies new lymphadenopathy or easy bruising Neurological:Denies numbness, tingling or new weaknesses Behavioral/Psych: Mood is stable, no new changes  All other systems were reviewed with the patient and are negative.  PHYSICAL EXAMINATION: ECOG PERFORMANCE STATUS: 1 - Symptomatic but completely ambulatory  Filed Vitals:   10/28/14 1009  BP: 137/49  Pulse: 78  Temp: 98.3 F (36.8 C)  Resp: 18   Filed Weights   10/28/14 1009  Weight: 170 lb 4.8 oz (77.248 kg)    GENERAL:alert, no distress and comfortable SKIN: skin color, texture, turgor are normal, no rashes or significant lesions EYES: normal, Conjunctiva are pink and non-injected, sclera clear Musculoskeletal:no cyanosis of digits and no clubbing  NEURO: alert & oriented x 3 with fluent speech, no focal motor/sensory deficits  LABORATORY DATA:  I have reviewed the data as listed No results found for this or any previous visit (from the past 48 hour(s)).  Lab Results  Component Value Date   WBC 10.0 10/28/2014   HGB 8.7* 10/28/2014   HCT 28.1* 10/28/2014   MCV 95.9 10/28/2014   PLT 207 10/28/2014   ASSESSMENT & PLAN:  Deficiency anemia I believe  the cause of her anemia is multifactorial, likely component of anemia chronic disease and iron deficiency. After intravenous iron infusion, she has not improved. I recommend the use of erythropoeitin stimulating agents. We discussed the risks, benefits, side effects of erythropoietin stimulating agents for anemia, with the goal of keeping the hemoglobin greater than 10 g. I discussed with the patient and potential side effects such as risk of thrombosis, severe hypertension, risk of congestive heart failure and stroke and she agreed to proceed. I recommend 200 mcg every 3 weeks and we will reassess her response rates after 4 treatments and determine whether dosage adjustment is needed. I will see the patient back in 3 months.     All questions were answered. The patient knows to call the clinic with any problems, questions or concerns. No barriers to learning was detected.  I spent 25 minutes counseling the patient face to face. The total time spent in the appointment was 30 minutes and more than 50% was on counseling.  [facility], [place], MD 10/28/2014 9:48 PM

## 2014-10-28 NOTE — Telephone Encounter (Signed)
Pt confirmed labs/ov/inj per 12/01 POF, gave pt AVS.... KJ

## 2014-10-28 NOTE — Assessment & Plan Note (Signed)
I believe the cause of her anemia is multifactorial, likely component of anemia chronic disease and iron deficiency. After intravenous iron infusion, she has not improved. I recommend the use of erythropoeitin stimulating agents. We discussed the risks, benefits, side effects of erythropoietin stimulating agents for anemia, with the goal of keeping the hemoglobin greater than 10 g. I discussed with the patient and potential side effects such as risk of thrombosis, severe hypertension, risk of congestive heart failure and stroke and she agreed to proceed. I recommend 200 mcg every 3 weeks and we will reassess her response rates after 4 treatments and determine whether dosage adjustment is needed. I will see the patient back in 3 months.

## 2014-10-29 ENCOUNTER — Telehealth: Payer: Self-pay | Admitting: *Deleted

## 2014-10-29 NOTE — Telephone Encounter (Signed)
Pt called for name of injection she got yesterday.  Informed her of name of medication is Aranesp and spelled it for her. She verbalized understanding.

## 2014-11-06 ENCOUNTER — Telehealth: Payer: Self-pay | Admitting: *Deleted

## 2014-11-06 ENCOUNTER — Ambulatory Visit (HOSPITAL_COMMUNITY)
Admission: RE | Admit: 2014-11-06 | Discharge: 2014-11-06 | Disposition: A | Discharge status: Home / Self Care | Payer: Medicare Other | Source: Ambulatory Visit | Attending: Hematology and Oncology | Admitting: Hematology and Oncology

## 2014-11-06 ENCOUNTER — Encounter (HOSPITAL_COMMUNITY): Payer: Self-pay | Admitting: Cardiovascular Disease

## 2014-11-06 DIAGNOSIS — D638 Anemia in other chronic diseases classified elsewhere: Secondary | ICD-10-CM

## 2014-11-06 DIAGNOSIS — D509 Iron deficiency anemia, unspecified: Secondary | ICD-10-CM

## 2014-11-06 NOTE — Telephone Encounter (Signed)
OK 

## 2014-11-06 NOTE — Telephone Encounter (Signed)
Pt states feeling very weak and just "can't do anything."   She is not scheduled for lab again until 12/22.  Pt asks if she can come tomorrow to check her Hemoglobin?  Informed pt she can come tomorrow  lab appt at 11:30 am.  Instructed her to wait for results and orders from Dr. Alvy Bimler.  She verbalized understanding.

## 2014-11-07 ENCOUNTER — Telehealth: Payer: Self-pay | Admitting: Hematology and Oncology

## 2014-11-07 ENCOUNTER — Ambulatory Visit (HOSPITAL_COMMUNITY)
Admission: RE | Admit: 2014-11-07 | Discharge: 2014-11-07 | Disposition: A | Discharge status: Home / Self Care | Payer: Medicare Other | Source: Ambulatory Visit | Attending: Cardiology | Admitting: Cardiology

## 2014-11-07 ENCOUNTER — Other Ambulatory Visit: Payer: Self-pay | Admitting: *Deleted

## 2014-11-07 ENCOUNTER — Other Ambulatory Visit (HOSPITAL_BASED_OUTPATIENT_CLINIC_OR_DEPARTMENT_OTHER): Payer: Medicare Other

## 2014-11-07 DIAGNOSIS — I313 Pericardial effusion (noninflammatory): Secondary | ICD-10-CM | POA: Insufficient documentation

## 2014-11-07 DIAGNOSIS — I10 Essential (primary) hypertension: Secondary | ICD-10-CM | POA: Insufficient documentation

## 2014-11-07 DIAGNOSIS — E785 Hyperlipidemia, unspecified: Secondary | ICD-10-CM | POA: Diagnosis not present

## 2014-11-07 DIAGNOSIS — Z87891 Personal history of nicotine dependence: Secondary | ICD-10-CM | POA: Diagnosis not present

## 2014-11-07 DIAGNOSIS — R011 Cardiac murmur, unspecified: Secondary | ICD-10-CM | POA: Diagnosis not present

## 2014-11-07 DIAGNOSIS — I3139 Other pericardial effusion (noninflammatory): Secondary | ICD-10-CM

## 2014-11-07 DIAGNOSIS — D509 Iron deficiency anemia, unspecified: Secondary | ICD-10-CM

## 2014-11-07 DIAGNOSIS — D539 Nutritional anemia, unspecified: Secondary | ICD-10-CM

## 2014-11-07 DIAGNOSIS — I319 Disease of pericardium, unspecified: Secondary | ICD-10-CM

## 2014-11-07 DIAGNOSIS — J449 Chronic obstructive pulmonary disease, unspecified: Secondary | ICD-10-CM | POA: Diagnosis not present

## 2014-11-07 DIAGNOSIS — J45909 Unspecified asthma, uncomplicated: Secondary | ICD-10-CM | POA: Diagnosis not present

## 2014-11-07 DIAGNOSIS — I739 Peripheral vascular disease, unspecified: Secondary | ICD-10-CM | POA: Insufficient documentation

## 2014-11-07 LAB — COMPREHENSIVE METABOLIC PANEL (CC13)
ALT: 23 U/L (ref 0–55)
ANION GAP: 12 meq/L — AB (ref 3–11)
AST: 21 U/L (ref 5–34)
Albumin: 3.3 g/dL — ABNORMAL LOW (ref 3.5–5.0)
Alkaline Phosphatase: 97 U/L (ref 40–150)
BUN: 29.8 mg/dL — ABNORMAL HIGH (ref 7.0–26.0)
CHLORIDE: 101 meq/L (ref 98–109)
CO2: 26 meq/L (ref 22–29)
CREATININE: 1.4 mg/dL — AB (ref 0.6–1.1)
Calcium: 9.5 mg/dL (ref 8.4–10.4)
EGFR: 43 mL/min/{1.73_m2} — ABNORMAL LOW (ref >90)
Glucose: 272 mg/dl — ABNORMAL HIGH (ref 70–140)
Potassium: 4.7 mEq/L (ref 3.5–5.1)
Sodium: 139 mEq/L (ref 136–145)
TOTAL PROTEIN: 6.5 g/dL (ref 6.4–8.3)
Total Bilirubin: 0.24 mg/dL (ref 0.20–1.20)

## 2014-11-07 LAB — CBC & DIFF AND RETIC
BASO%: 0.3 % (ref 0.0–2.0)
Basophils Absolute: 0 10*3/uL (ref 0.0–0.1)
EOS%: 2.2 % (ref 0.0–7.0)
Eosinophils Absolute: 0.2 10*3/uL (ref 0.0–0.5)
HEMATOCRIT: 26.9 % — AB (ref 34.8–46.6)
HGB: 8.3 g/dL — ABNORMAL LOW (ref 11.6–15.9)
Immature Retic Fract: 19.1 % — ABNORMAL HIGH (ref 1.60–10.00)
LYMPH%: 8.4 % — AB (ref 14.0–49.7)
MCH: 28.8 pg (ref 25.1–34.0)
MCHC: 30.9 g/dL — AB (ref 31.5–36.0)
MCV: 93.4 fL (ref 79.5–101.0)
MONO#: 0.5 10*3/uL (ref 0.1–0.9)
MONO%: 4.8 % (ref 0.0–14.0)
NEUT#: 8.3 10*3/uL — ABNORMAL HIGH (ref 1.5–6.5)
NEUT%: 84.3 % — ABNORMAL HIGH (ref 38.4–76.8)
PLATELETS: 231 10*3/uL (ref 145–400)
RBC: 2.88 10*6/uL — ABNORMAL LOW (ref 3.70–5.45)
RDW: 15.8 % — ABNORMAL HIGH (ref 11.2–14.5)
Retic %: 7.15 % — ABNORMAL HIGH (ref 0.70–2.10)
Retic Ct Abs: 205.92 10*3/uL — ABNORMAL HIGH (ref 33.70–90.70)
WBC: 9.9 10*3/uL (ref 3.9–10.3)
lymph#: 0.8 10*3/uL — ABNORMAL LOW (ref 0.9–3.3)

## 2014-11-07 LAB — HOLD TUBE, BLOOD BANK

## 2014-11-07 NOTE — Progress Notes (Signed)
2D Limited Echocardiogram Complete.  11/07/2014   Laxmi Choung [place], [place]

## 2014-11-07 NOTE — Telephone Encounter (Signed)
, °

## 2014-11-11 ENCOUNTER — Other Ambulatory Visit: Payer: Self-pay | Admitting: Hematology and Oncology

## 2014-11-11 ENCOUNTER — Encounter: Payer: Self-pay | Admitting: Cardiovascular Disease

## 2014-11-11 ENCOUNTER — Ambulatory Visit (HOSPITAL_COMMUNITY)
Admission: RE | Admit: 2014-11-11 | Discharge: 2014-11-11 | Disposition: A | Discharge status: Home / Self Care | Payer: Medicare Other | Source: Ambulatory Visit | Attending: Hematology and Oncology | Admitting: Hematology and Oncology

## 2014-11-11 ENCOUNTER — Other Ambulatory Visit (HOSPITAL_BASED_OUTPATIENT_CLINIC_OR_DEPARTMENT_OTHER): Payer: Medicare Other

## 2014-11-11 ENCOUNTER — Telehealth: Payer: Self-pay | Admitting: *Deleted

## 2014-11-11 ENCOUNTER — Ambulatory Visit (INDEPENDENT_AMBULATORY_CARE_PROVIDER_SITE_OTHER): Payer: Medicare Other | Admitting: Cardiovascular Disease

## 2014-11-11 VITALS — BP 109/45 | HR 72 | Temp 98.2°F | Resp 18

## 2014-11-11 VITALS — BP 110/58 | HR 70 | Ht 64.0 in | Wt 171.8 lb

## 2014-11-11 DIAGNOSIS — D638 Anemia in other chronic diseases classified elsewhere: Secondary | ICD-10-CM

## 2014-11-11 DIAGNOSIS — I319 Disease of pericardium, unspecified: Secondary | ICD-10-CM

## 2014-11-11 DIAGNOSIS — E785 Hyperlipidemia, unspecified: Secondary | ICD-10-CM

## 2014-11-11 DIAGNOSIS — I313 Pericardial effusion (noninflammatory): Secondary | ICD-10-CM

## 2014-11-11 DIAGNOSIS — D509 Iron deficiency anemia, unspecified: Secondary | ICD-10-CM

## 2014-11-11 DIAGNOSIS — I3139 Other pericardial effusion (noninflammatory): Secondary | ICD-10-CM

## 2014-11-11 DIAGNOSIS — I2510 Atherosclerotic heart disease of native coronary artery without angina pectoris: Secondary | ICD-10-CM

## 2014-11-11 DIAGNOSIS — I739 Peripheral vascular disease, unspecified: Secondary | ICD-10-CM

## 2014-11-11 DIAGNOSIS — I10 Essential (primary) hypertension: Secondary | ICD-10-CM

## 2014-11-11 DIAGNOSIS — I779 Disorder of arteries and arterioles, unspecified: Secondary | ICD-10-CM

## 2014-11-11 DIAGNOSIS — D539 Nutritional anemia, unspecified: Secondary | ICD-10-CM

## 2014-11-11 DIAGNOSIS — I2583 Coronary atherosclerosis due to lipid rich plaque: Secondary | ICD-10-CM

## 2014-11-11 DIAGNOSIS — Z79899 Other long term (current) drug therapy: Secondary | ICD-10-CM

## 2014-11-11 LAB — CBC WITH DIFFERENTIAL/PLATELET
BASO%: 0.8 % (ref 0.0–2.0)
BASOS ABS: 0.1 10*3/uL (ref 0.0–0.1)
EOS ABS: 0.4 10*3/uL (ref 0.0–0.5)
EOS%: 4.5 % (ref 0.0–7.0)
HEMATOCRIT: 24.7 % — AB (ref 34.8–46.6)
HGB: 7.5 g/dL — ABNORMAL LOW (ref 11.6–15.9)
LYMPH#: 1.2 10*3/uL (ref 0.9–3.3)
LYMPH%: 15.6 % (ref 14.0–49.7)
MCH: 27.7 pg (ref 25.1–34.0)
MCHC: 30.4 g/dL — ABNORMAL LOW (ref 31.5–36.0)
MCV: 91.2 fL (ref 79.5–101.0)
MONO#: 0.9 10*3/uL (ref 0.1–0.9)
MONO%: 12.1 % (ref 0.0–14.0)
NEUT%: 67 % (ref 38.4–76.8)
NEUTROS ABS: 5.2 10*3/uL (ref 1.5–6.5)
Platelets: 247 10*3/uL (ref 145–400)
RBC: 2.71 10*6/uL — ABNORMAL LOW (ref 3.70–5.45)
RDW: 17.1 % — AB (ref 11.2–14.5)
WBC: 7.8 10*3/uL (ref 3.9–10.3)

## 2014-11-11 LAB — HOLD TUBE, BLOOD BANK

## 2014-11-11 LAB — ABO/RH: ABO/RH(D): A NEG

## 2014-11-11 MED ORDER — HEPARIN SOD (PORK) LOCK FLUSH 100 UNIT/ML IV SOLN: 500.0000 [IU] | Freq: Every day | INTRAVENOUS | Status: DC | PRN

## 2014-11-11 MED ORDER — SODIUM CHLORIDE 0.9 % IJ SOLN: 10.0000 mL | INTRAMUSCULAR | Status: DC | PRN

## 2014-11-11 MED ORDER — SODIUM CHLORIDE 0.9 % IV SOLN: 250.0000 mL | Freq: Once | INTRAVENOUS | Status: DC

## 2014-11-11 MED ORDER — HEPARIN SOD (PORK) LOCK FLUSH 100 UNIT/ML IV SOLN: 250.0000 [IU] | INTRAVENOUS | Status: DC | PRN

## 2014-11-11 MED ORDER — SODIUM CHLORIDE 0.9 % IJ SOLN: 3.0000 mL | INTRAMUSCULAR | Status: DC | PRN

## 2014-11-11 NOTE — Patient Instructions (Signed)
Dr. Gwenlyn Found has ordered for you to have lab work done today.  In 6 months Dr. Gwenlyn Found has ordered you to have an Echocardiogram. Echocardiography is a painless test that uses sound waves to create images of your heart. It provides your doctor with information about the size and shape of your heart and how well your heart's chambers and valves are working. This procedure takes approximately one hour. There are no restrictions for this procedure.  We request that you follow-up in: 3 months with Tarri Fuller, PA-C and in 6 months with Dr Gwenlyn Found.  You will receive a reminder letter in the mail two months in advance. If you don't receive a letter, please call our office to schedule the follow-up appointment.

## 2014-11-11 NOTE — Assessment & Plan Note (Signed)
History of stable moderate pericardial effusion by serial 2-D echocardiogram cardiography in August and December of this year without evidence of An odd. We will continue to monitor this by 2-D echo in 6 months

## 2014-11-11 NOTE — Assessment & Plan Note (Signed)
History of hypertension with blood pressure measured today 110/58. She is on carvedilol and diuretics. Continue current meds at current dosing

## 2014-11-11 NOTE — Telephone Encounter (Signed)
S/w pt in lobby and instructed on order for one unit of blood from Dr. Alvy Bimler for Hgb 7.5.   She is scheduled for transfusion at [facility] today at 2 pm.  She verbalized understanding.

## 2014-11-11 NOTE — Procedures (Signed)
[facility]  Procedure Note  [name] ZOX:[id number] DOB: March 19, 1941 DOA: 11/11/2014   Dr. Alvy Bimler  Associated Diagnosis: Anemia in chronic illness  Procedure Note: IV started, 1 unit PRBC's transfused per order, IV discontinued   Condition During Procedure: Patient stable, denies any complaints   Condition at Discharge: Amublatory at discharge, to lobby to await transportation   [name], [facility]

## 2014-11-11 NOTE — Assessment & Plan Note (Signed)
History of moderate left internal carotid artery stenosis by duplex ultrasound performed 07/01/14. She is neurologically asymptomatic. We'll continue to follow this by serial annual Doppler's

## 2014-11-11 NOTE — Assessment & Plan Note (Addendum)
History of IVIS guided, hemodynamically supported, radial artery Roto-stent of the left main/LAD 04/16/14. Her ejection fraction has improved from 35-65%. She denies chest pain or symptoms of congestive heart failure.she remains on dual antiplatelet therapy with Brilenta and aspirin.

## 2014-11-11 NOTE — Progress Notes (Signed)
11/11/2014 [name]   1941-02-19  [id number]  Primary Physician Leola Brazil, MD Primary Cardiologist: Lorretta Harp MD [name]   HPI:  73 year-old female with a history of hypertension, combined chronic heart failure with EF 35% and grade 1 diastolic dysfunction, COPD, CAD, diabetes, perform atrial disease, pulmonary hypertension, GI bleed. 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 I last saw her in the office 06/10/14. She saw Tenny Craw PA-C 08/01/14. She was hospitalized 04/05/2014 secondary to acute respiratory failure requiring intubation. During the hospitalization her peak troponin was > 20 she had pulmonary edema requiring IV diuresing. Echo was done EF is 35-40% she did undergo left heart cath 04/09/2014 with three-vessel disease involving proximal and mid LAD, severe distal left main, ostial circumflex and RCA. PCI was not feasible due to coronary anatomy. T CTS evaluated her for bypass grafting but she was found to be a poor surgical candidate. PCI was felt to be the safest intervention at that point. 04/16/2014 she underwent Impala insertion IVUS, rotational atherectomy of the LAD, angioplasty and stenting of the proximal and mid LAD with Xience Alpine drug-eluting stent. Postop was complicated by hypotension, cardiogenic shock requiring dopamine drip. She also had acute blood loss anemia requiring 2 units packed RBCs. The plan is to use Brilinta alone for her new stent for 12 months and then change to aspirin alone daily. She was mobilized by cardiac rehabilitation, continued to to improve and was finally discharged home 04/21/2014. I saw Pt for follow up on 04/28/14.  She had not started her Iron at that time. She had also complained of some dark stools. Labs were ordered and her H/H Had decreased from 9.4 to 8.8. Arrangements were made for her to see GI which she did and was then admitted for transfusion of 2 units of  packed cells on 05/09/2014. Plans are also new for virtual colonoscopy with camera.  Since her procedure she has done well except for blood loss anemia of unclear etiology despite aggressive GI evaluation. Initially I had recommended that she stop her aspirin however since she has a drug-eluting stent in LAD and is on Brilenta I suggested that she restart her aspirin with careful followup of her hemoglobin. She has gotten iron infusions since I last saw her and is followed by hematologist as well as a gastroenterologist. A 2-D echocardiogram performed on 11/07/14 revealed marked improvement in her ejection fraction from 35-65%. She continues to have a moderate pericardial effusion which has remained stable without evidence of pericardial tamponade . She denies chest pain or shortness of breath.   Current Outpatient Prescriptions  Medication Sig Dispense Refill  . albuterol (PROVENTIL HFA;VENTOLIN HFA) 108 (90 BASE) MCG/ACT inhaler Inhale 1 puff into the lungs every 6 (six) hours as needed. FOR SHORTNESS OF BREATH    . ALPRAZolam (XANAX) 0.5 MG tablet Take 0.25 mg by mouth 2 (two) times daily as needed for anxiety or sleep.    Marland Kitchen ammonium lactate (LAC-HYDRIN) 12 % lotion Apply 1 application topically daily as needed for dry skin (psorias).     Marland Kitchen aspirin 81 MG tablet Take 81 mg by mouth daily.    . brimonidine (ALPHAGAN) 0.2 % ophthalmic solution   2  . carvedilol (COREG) 3.125 MG tablet Take 1 tablet (3.125 mg total) by mouth 2 (two) times daily with a meal. 180 tablet 3  . Darbepoetin Alfa (ARANESP, ALBUMIN FREE, IJ) Inject as directed every 21 (  twenty-one) days.    Marland Kitchen esomeprazole (NEXIUM) 40 MG capsule Take 40 mg by mouth daily before breakfast.    . feeding supplement, GLUCERNA SHAKE, (GLUCERNA SHAKE) LIQD Take 237 mLs by mouth daily.     . ferrous sulfate 325 (65 FE) MG tablet Take 1 tablet (325 mg total) by mouth 2 (two) times daily with a meal. 60 tablet 2  . fluticasone (FLONASE) 50 MCG/ACT  nasal spray Place 1 spray into both nostrils at bedtime as needed for allergies.     . Fluticasone-Salmeterol (ADVAIR HFA IN) Inhale into the lungs. 2 puffs in am ,1 puff in evening    . furosemide (LASIX) 40 MG tablet Take 1 tablet (40 mg total) by mouth daily. 30 tablet   . glimepiride (AMARYL) 2 MG tablet     . ipratropium-albuterol (DUONEB) 0.5-2.5 (3) MG/3ML SOLN Take 3 mLs by nebulization every 4 (four) hours as needed (shortness of breath).    . Liniments ([employer] ARTHRITIS PAIN RELIEVER EX) Apply 1 application topically daily as needed (for shoulder pains).    . metFORMIN (GLUCOPHAGE) 500 MG tablet Take 500 mg by mouth 2 (two) times daily with a meal.    . Multiple Vitamins-Minerals (ICAPS) CAPS Take 1 capsule by mouth daily.    Marland Kitchen nystatin (MYCOSTATIN) 100000 UNIT/ML suspension Use as directed 5 mLs in the mouth or throat every 8 (eight) hours as needed.   1  . predniSONE (DELTASONE) 5 MG tablet Take 5 mg by mouth every Monday, Wednesday, and Friday.     . ranitidine (ZANTAC) 150 MG capsule Take 150 mg by mouth at bedtime.     . simethicone (MYLICON) 80 MG chewable tablet Chew 1 tablet (80 mg total) by mouth 4 (four) times daily as needed for flatulence. 30 tablet 0  . ticagrelor (BRILINTA) 90 MG TABS tablet Take 1 tablet (90 mg total) by mouth 2 (two) times daily. 180 tablet 3  . Travoprost, BAK Free, (TRAVATAMN) 0.004 % SOLN ophthalmic solution Place 1 drop into both eyes at bedtime.      No current facility-administered medications for this visit.    Allergies  Allergen Reactions  . Amlodipine Besy-Benazepril Hcl Other (See Comments)    Nervous/shakiness  . Statins Other (See Comments)    "can't take any of them; cramp me up" (12/06/2012)  . Tradjenta [Linagliptin] Other (See Comments)    Extreme muscle pains, chest and back pains  . Plavix [Clopidogrel] Rash    Severe burning of the skin.Pt states that the doctor told her that her rash was psoriasis and not related to plavix but she  states the rash came from the plavix  . Raptiva [Efalizumab] Rash    History   Social History  . Marital Status: Widowed    Spouse Name: N/A    Number of Children: 0  . Years of Education: N/A   Occupational History  . 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 Smoking status: Former Smoker -- 1.00 packs/day for 40 years    Types: Cigarettes  . Smokeless tobacco: Former Systems developer    Quit date: 09/28/2013     Comment: pt states that she smokes "every once in a while"  . Alcohol Use: No     Comment: "quit social drinking years ago"  . Drug Use: No  . Sexual Activity: No   Other Topics Concern  . Not on file   Social History Narrative     Review of  Systems: General: negative for chills, fever, night sweats or weight changes.  Cardiovascular: negative for chest pain, dyspnea on exertion, edema, orthopnea, palpitations, paroxysmal nocturnal dyspnea or shortness of breath Dermatological: negative for rash Respiratory: negative for cough or wheezing Urologic: negative for hematuria Abdominal: negative for nausea, vomiting, diarrhea, bright red blood per rectum, melena, or hematemesis Neurologic: negative for visual changes, syncope, or dizziness All other systems reviewed and are otherwise negative except as noted above.    Blood pressure 110/58, pulse 70, height 5\' 4"  (1.626 m), weight 171 lb 12.8 oz (77.928 kg).  General appearance: alert and no distress Neck: no adenopathy, no JVD, supple, symmetrical, trachea midline, thyroid not enlarged, symmetric, no tenderness/mass/nodules and soft right carotid bruit Lungs: clear to auscultation bilaterally Heart: regular rate and rhythm, S1, S2 normal, no murmur, click, rub or gallop Extremities: extremities normal, atraumatic, no cyanosis or edema  EKG normal sinus rhythm at 70 with septal Q waves and nonspecific ST and T-wave changes. I personally reviewed this EKG  ASSESSMENT AND PLAN:    Essential hypertension History of hypertension with blood pressure measured today 110/58. She is on carvedilol and diuretics. Continue current meds at current dosing  CAD (coronary artery disease), 04/16/14 Impala insertion IVUS, rotational atherectomy of the LAD, angioplasty and stenting of the proximal and mid LAD with Xience Alpine drug-eluting stent History of IVIS guided, hemodynamically supported, radial artery Roto-stent of the left main/LAD 04/16/14. Her ejection fraction has improved from 35-65%. She denies chest pain or symptoms of congestive heart failure.she remains on dual antiplatelet therapy with Brilenta and aspirin.  Hyperlipidemia with target LDL less than 70 History of hyperlipidemia currently not on statin therapy. Her last lipid profile performed 04/06/14 revealed a total cholesterol of 153, LDL 81 and HDL of 62  Pericardial effusion History of stable moderate pericardial effusion by serial 2-D echocardiogram cardiography in August and December of this year without evidence of An odd. We will continue to monitor this by 2-D echo in 6 months      Lorretta Harp MD [facility], [facility] 11/11/2014 11:26 AM

## 2014-11-11 NOTE — Assessment & Plan Note (Signed)
History of hyperlipidemia currently not on statin therapy. Her last lipid profile performed 04/06/14 revealed a total cholesterol of 153, LDL 81 and HDL of 62

## 2014-11-12 ENCOUNTER — Ambulatory Visit (HOSPITAL_COMMUNITY): Payer: Medicare Other

## 2014-11-12 LAB — BASIC METABOLIC PANEL
BUN: 25 mg/dL — ABNORMAL HIGH (ref 6–23)
CO2: 30 mEq/L (ref 19–32)
CREATININE: 1.13 mg/dL — AB (ref 0.50–1.10)
Calcium: 9.7 mg/dL (ref 8.4–10.5)
Chloride: 99 mEq/L (ref 96–112)
Glucose, Bld: 192 mg/dL — ABNORMAL HIGH (ref 70–99)
Potassium: 4.6 mEq/L (ref 3.5–5.3)
Sodium: 138 mEq/L (ref 135–145)

## 2014-11-12 LAB — TYPE AND SCREEN
ABO/RH(D): A NEG
Antibody Screen: NEGATIVE
UNIT DIVISION: 0

## 2014-11-12 LAB — PREPARE RBC (CROSSMATCH)

## 2014-11-18 ENCOUNTER — Ambulatory Visit (HOSPITAL_BASED_OUTPATIENT_CLINIC_OR_DEPARTMENT_OTHER): Payer: Medicare Other | Admitting: Lab

## 2014-11-18 ENCOUNTER — Ambulatory Visit (HOSPITAL_BASED_OUTPATIENT_CLINIC_OR_DEPARTMENT_OTHER): Payer: Medicare Other

## 2014-11-18 DIAGNOSIS — D509 Iron deficiency anemia, unspecified: Secondary | ICD-10-CM

## 2014-11-18 DIAGNOSIS — D649 Anemia, unspecified: Secondary | ICD-10-CM

## 2014-11-18 DIAGNOSIS — D539 Nutritional anemia, unspecified: Secondary | ICD-10-CM

## 2014-11-18 LAB — CBC & DIFF AND RETIC
BASO%: 0.2 % (ref 0.0–2.0)
BASOS ABS: 0 10*3/uL (ref 0.0–0.1)
EOS ABS: 0.3 10*3/uL (ref 0.0–0.5)
EOS%: 3.9 % (ref 0.0–7.0)
HEMATOCRIT: 29.5 % — AB (ref 34.8–46.6)
HGB: 8.9 g/dL — ABNORMAL LOW (ref 11.6–15.9)
IMMATURE RETIC FRACT: 16.2 % — AB (ref 1.60–10.00)
LYMPH#: 1.4 10*3/uL (ref 0.9–3.3)
LYMPH%: 15.8 % (ref 14.0–49.7)
MCH: 27 pg (ref 25.1–34.0)
MCHC: 30.2 g/dL — AB (ref 31.5–36.0)
MCV: 89.4 fL (ref 79.5–101.0)
MONO#: 0.8 10*3/uL (ref 0.1–0.9)
MONO%: 9.5 % (ref 0.0–14.0)
NEUT%: 70.6 % (ref 38.4–76.8)
NEUTROS ABS: 6 10*3/uL (ref 1.5–6.5)
PLATELETS: 208 10*3/uL (ref 145–400)
RBC: 3.3 10*6/uL — ABNORMAL LOW (ref 3.70–5.45)
RDW: 16 % — ABNORMAL HIGH (ref 11.2–14.5)
Retic %: 5.74 % — ABNORMAL HIGH (ref 0.70–2.10)
Retic Ct Abs: 189.42 10*3/uL — ABNORMAL HIGH (ref 33.70–90.70)
WBC: 8.6 10*3/uL (ref 3.9–10.3)

## 2014-11-18 LAB — HOLD TUBE, BLOOD BANK

## 2014-11-18 MED ORDER — DARBEPOETIN ALFA 200 MCG/0.4ML IJ SOSY
200.0000 ug | PREFILLED_SYRINGE | Freq: Once | INTRAMUSCULAR | Status: AC
  Administered 2014-11-18: 200 ug via SUBCUTANEOUS
  Filled 2014-11-18: qty 0.4

## 2014-11-18 NOTE — Patient Instructions (Signed)
Darbepoetin Alfa injection What is this medicine? DARBEPOETIN ALFA (dar be POE e tin AL fa) helps your body make more red blood cells. It is used to treat anemia caused by chronic kidney failure and chemotherapy. This medicine may be used for other purposes; ask your health care provider or pharmacist if you have questions. COMMON BRAND NAME(S): Aranesp What should I tell my health care provider before I take this medicine? They need to know if you have any of these conditions: -blood clotting disorders or history of blood clots -cancer patient not on chemotherapy -cystic fibrosis -heart disease, such as angina, heart failure, or a history of a heart attack -hemoglobin level of 12 g/dL or greater -high blood pressure -low levels of folate, iron, or vitamin B12 -seizures -an unusual or allergic reaction to darbepoetin, erythropoietin, albumin, hamster proteins, latex, other medicines, foods, dyes, or preservatives -pregnant or trying to get pregnant -breast-feeding How should I use this medicine? This medicine is for injection into a vein or under the skin. It is usually given by a health care professional in a hospital or clinic setting. If you get this medicine at home, you will be taught how to prepare and give this medicine. Do not shake the solution before you withdraw a dose. Use exactly as directed. Take your medicine at regular intervals. Do not take your medicine more often than directed. It is important that you put your used needles and syringes in a special sharps container. Do not put them in a trash can. If you do not have a sharps container, call your pharmacist or healthcare provider to get one. Talk to your pediatrician regarding the use of this medicine in children. While this medicine may be used in children as young as 1 year for selected conditions, precautions do apply. Overdosage: If you think you have taken too much of this medicine contact a poison control center or  emergency room at once. NOTE: This medicine is only for you. Do not share this medicine with others. What if I miss a dose? If you miss a dose, take it as soon as you can. If it is almost time for your next dose, take only that dose. Do not take double or extra doses. What may interact with this medicine? Do not take this medicine with any of the following medications: -epoetin alfa This list may not describe all possible interactions. Give your health care provider a list of all the medicines, herbs, non-prescription drugs, or dietary supplements you use. Also tell them if you smoke, drink alcohol, or use illegal drugs. Some items may interact with your medicine. What should I watch for while using this medicine? Visit your prescriber or health care professional for regular checks on your progress and for the needed blood tests and blood pressure measurements. It is especially important for the doctor to make sure your hemoglobin level is in the desired range, to limit the risk of potential side effects and to give you the best benefit. Keep all appointments for any recommended tests. Check your blood pressure as directed. Ask your doctor what your blood pressure should be and when you should contact him or her. As your body makes more red blood cells, you may need to take iron, folic acid, or vitamin B supplements. Ask your doctor or health care provider which products are right for you. If you have kidney disease continue dietary restrictions, even though this medication can make you feel better. Talk with your doctor or health   care professional about the foods you eat and the vitamins that you take. What side effects may I notice from receiving this medicine? Side effects that you should report to your doctor or health care professional as soon as possible: -allergic reactions like skin rash, itching or hives, swelling of the face, lips, or tongue -breathing problems -changes in vision -chest  pain -confusion, trouble speaking or understanding -feeling faint or lightheaded, falls -high blood pressure -muscle aches or pains -pain, swelling, warmth in the leg -rapid weight gain -severe headaches -sudden numbness or weakness of the face, arm or leg -trouble walking, dizziness, loss of balance or coordination -seizures (convulsions) -swelling of the ankles, feet, hands -unusually weak or tired Side effects that usually do not require medical attention (report to your doctor or health care professional if they continue or are bothersome): -diarrhea -fever, chills (flu-like symptoms) -headaches -nausea, vomiting -redness, stinging, or swelling at site where injected This list may not describe all possible side effects. Call your doctor for medical advice about side effects. You may report side effects to FDA at 1-800-FDA-1088. Where should I keep my medicine? Keep out of the reach of children. Store in a refrigerator between 2 and 8 degrees C (36 and 46 degrees F). Do not freeze. Do not shake. Throw away any unused portion if using a single-dose vial. Throw away any unused medicine after the expiration date. NOTE: This sheet is a summary. It may not cover all possible information. If you have questions about this medicine, talk to your doctor, pharmacist, or health care provider.  2015, Elsevier/[employer] Standard. (2008-10-28 10:23:57)  

## 2014-11-18 NOTE — Progress Notes (Signed)
Patient in for Aranesp 200 mcg injection today. Patient states, "I've been weak and short of breath today. I just don't feel well." Vital signs are blood pressure, left arm 91/58, pulse 67, temperature 98.0, O2 Sat 100%. Patient's HGB is 8.9 today. Blood pressure rechecked in right arm, 107/44. Dr. Alvy Bimler made aware. Aranesp 200 mcg injection to be given today, and Patient should increase fluid intake per Dr. Alvy Bimler. Patient made aware and verbalized understanding. Encouraged Patient to call the [facility] if condition worsens or if she has any questions or concerns. Patient verbalized understanding. Aranesp injection given as ordered. Patient tolerated injection well. Patient ambulating with cane and discharged from Flush Room without any further complaints.

## 2014-12-04 ENCOUNTER — Telehealth: Payer: Self-pay | Admitting: Cardiovascular Disease

## 2014-12-04 NOTE — Telephone Encounter (Signed)
Spoke with amber, aware the patient does not carry the diagnosis of CHF. EF% on recent echo 55-60.

## 2014-12-04 NOTE — Telephone Encounter (Signed)
Amber is calling in stating that the pt is in her CHF program but the pt was not aware that she had CHF. So Amber is wanting to confirm her diagnosis. Please call  thanks

## 2014-12-06 IMAGING — CR DG ABDOMEN 2V
3 series · 3 of 3 positions shown · non-contrast
Comparison: Ultrasound 04/08/2014.

CLINICAL DATA: Pain.

EXAM:
ABDOMEN - 2 VIEW

[w abdomen upright]
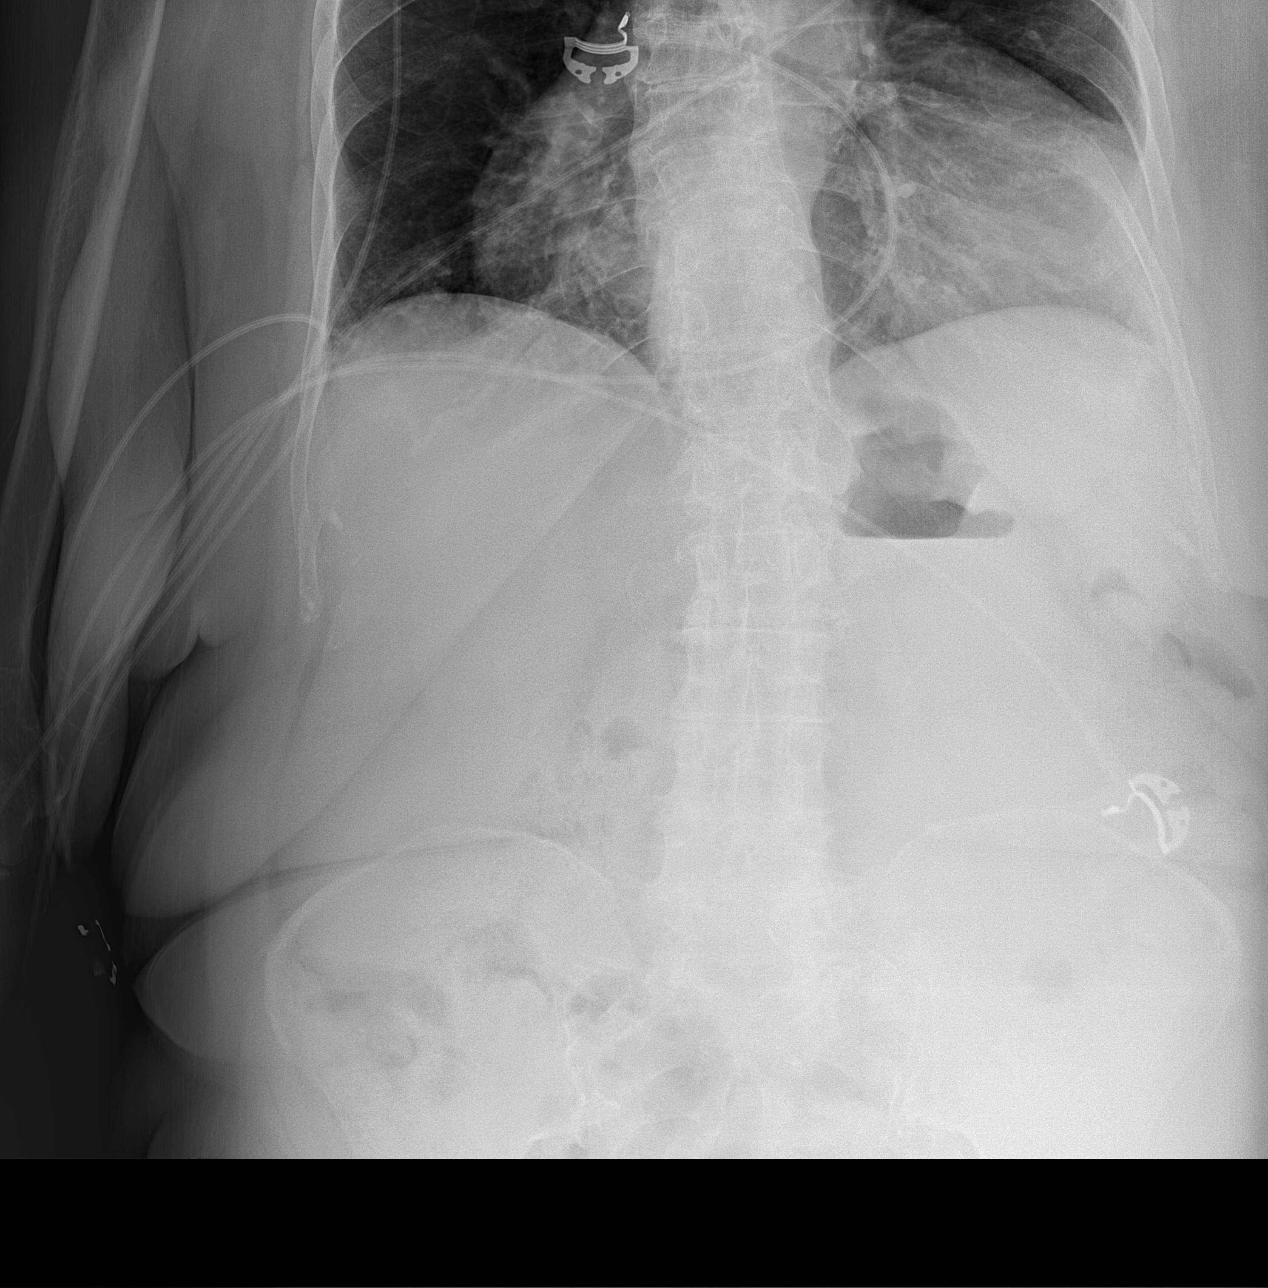

[t abdomen supine (1 of 2)]
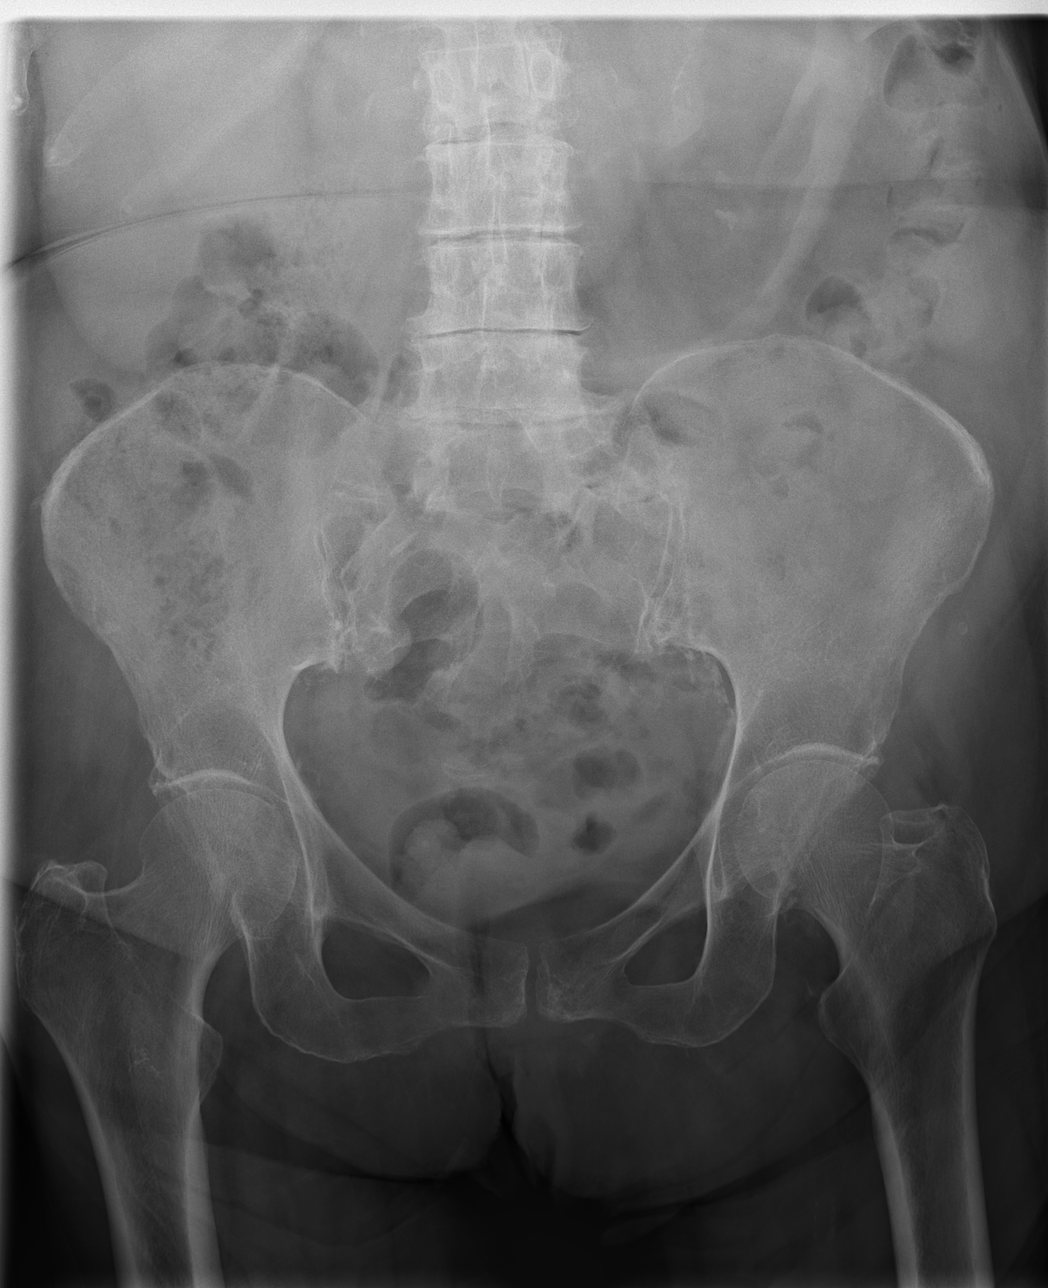

[t abdomen supine (2 of 2)]
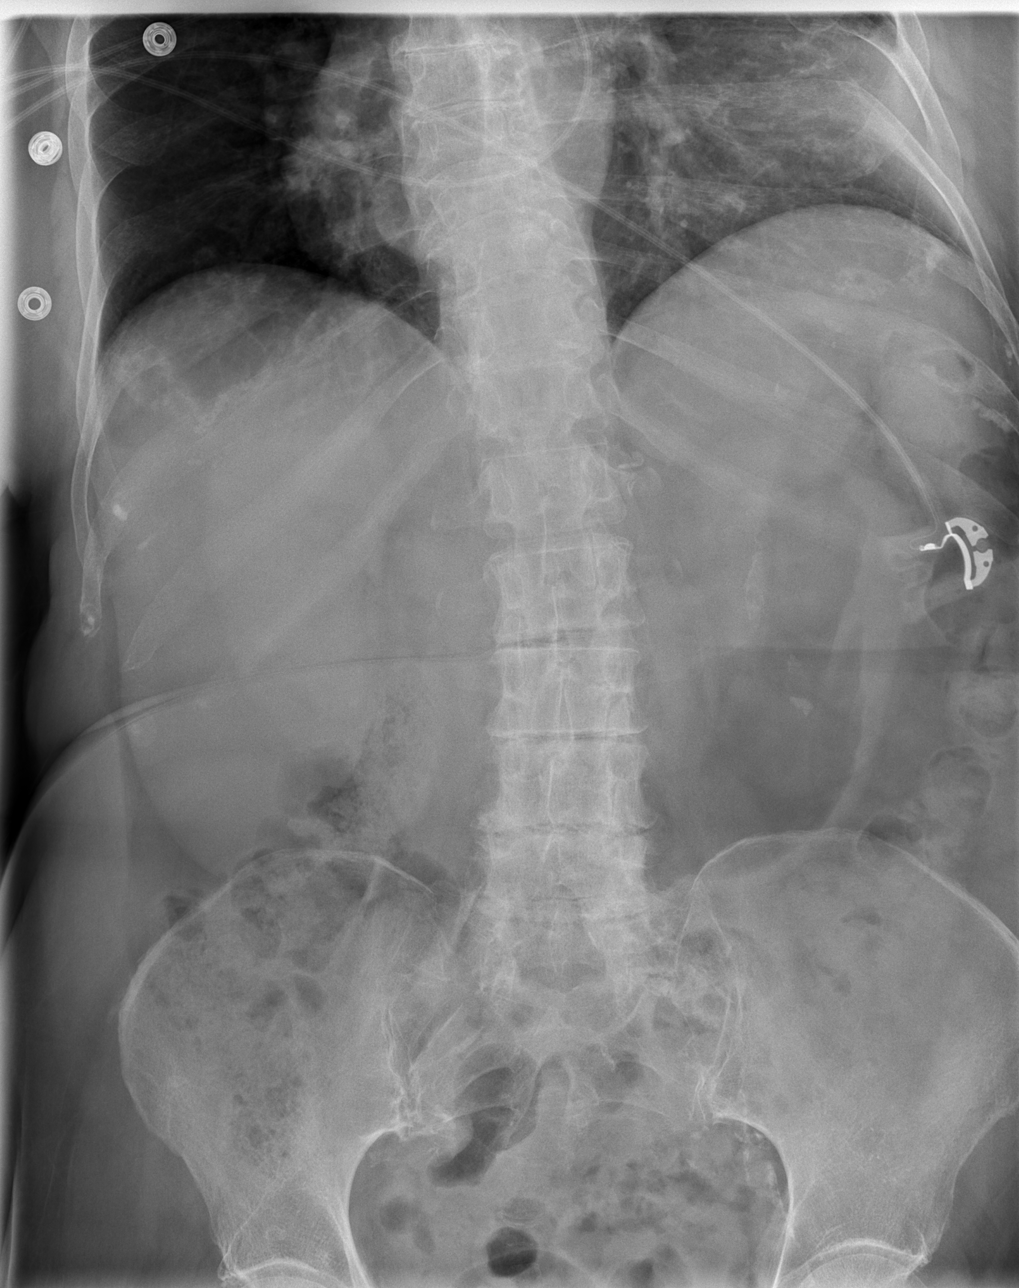

[3 of 3 positions shown; findings below may reference images not displayed]

FINDINGS: Soft tissue structures are unremarkable. The gas pattern is
nonspecific. Aortoiliac atherosclerotic vascular calcification
present. Degenerative changes lumbar spine and both hips.
Cardiomegaly.
IMPRESSION: 1. Nonspecific abdomen.
2. Atherosclerotic vascular disease.
3. Cardiomegaly.
4. Degenerative changes lumbar spine and both hips.

## 2014-12-09 ENCOUNTER — Other Ambulatory Visit: Payer: Self-pay | Admitting: *Deleted

## 2014-12-09 ENCOUNTER — Other Ambulatory Visit: Payer: Self-pay | Admitting: Hematology and Oncology

## 2014-12-09 ENCOUNTER — Other Ambulatory Visit (HOSPITAL_BASED_OUTPATIENT_CLINIC_OR_DEPARTMENT_OTHER): Payer: Medicare Other

## 2014-12-09 ENCOUNTER — Ambulatory Visit (HOSPITAL_COMMUNITY)
Admission: RE | Admit: 2014-12-09 | Discharge: 2014-12-09 | Disposition: A | Discharge status: Home / Self Care | Payer: Medicare Other | Source: Ambulatory Visit | Attending: Hematology and Oncology | Admitting: Hematology and Oncology

## 2014-12-09 ENCOUNTER — Ambulatory Visit: Payer: Medicare Other

## 2014-12-09 ENCOUNTER — Ambulatory Visit (HOSPITAL_BASED_OUTPATIENT_CLINIC_OR_DEPARTMENT_OTHER): Payer: Medicare Other

## 2014-12-09 ENCOUNTER — Telehealth: Payer: Self-pay | Admitting: Hematology and Oncology

## 2014-12-09 VITALS — BP 159/60 | HR 64 | Temp 98.3°F | Resp 18

## 2014-12-09 DIAGNOSIS — D539 Nutritional anemia, unspecified: Secondary | ICD-10-CM | POA: Diagnosis present

## 2014-12-09 DIAGNOSIS — D649 Anemia, unspecified: Secondary | ICD-10-CM

## 2014-12-09 DIAGNOSIS — D509 Iron deficiency anemia, unspecified: Secondary | ICD-10-CM

## 2014-12-09 DIAGNOSIS — D638 Anemia in other chronic diseases classified elsewhere: Secondary | ICD-10-CM

## 2014-12-09 LAB — CBC & DIFF AND RETIC
BASO%: 0.3 % (ref 0.0–2.0)
BASOS ABS: 0 10*3/uL (ref 0.0–0.1)
EOS ABS: 0.3 10*3/uL (ref 0.0–0.5)
EOS%: 3.6 % (ref 0.0–7.0)
HCT: 24.7 % — ABNORMAL LOW (ref 34.8–46.6)
HGB: 7.2 g/dL — ABNORMAL LOW (ref 11.6–15.9)
Immature Retic Fract: 25.2 % — ABNORMAL HIGH (ref 1.60–10.00)
LYMPH%: 19.2 % (ref 14.0–49.7)
MCH: 24.2 pg — ABNORMAL LOW (ref 25.1–34.0)
MCHC: 29.1 g/dL — ABNORMAL LOW (ref 31.5–36.0)
MCV: 82.9 fL (ref 79.5–101.0)
MONO#: 0.7 10*3/uL (ref 0.1–0.9)
MONO%: 8.2 % (ref 0.0–14.0)
NEUT#: 6.1 10*3/uL (ref 1.5–6.5)
NEUT%: 68.7 % (ref 38.4–76.8)
PLATELETS: 268 10*3/uL (ref 145–400)
RBC: 2.98 10*6/uL — ABNORMAL LOW (ref 3.70–5.45)
RDW: 17.1 % — ABNORMAL HIGH (ref 11.2–14.5)
Retic %: 4.73 % — ABNORMAL HIGH (ref 0.70–2.10)
Retic Ct Abs: 140.95 10*3/uL — ABNORMAL HIGH (ref 33.70–90.70)
WBC: 8.9 10*3/uL (ref 3.9–10.3)
lymph#: 1.7 10*3/uL (ref 0.9–3.3)

## 2014-12-09 LAB — HOLD TUBE, BLOOD BANK

## 2014-12-09 LAB — PREPARE RBC (CROSSMATCH)

## 2014-12-09 MED ORDER — DARBEPOETIN ALFA 200 MCG/0.4ML IJ SOSY
200.0000 ug | PREFILLED_SYRINGE | Freq: Once | INTRAMUSCULAR | Status: AC
  Administered 2014-12-09: 200 ug via SUBCUTANEOUS
  Filled 2014-12-09: qty 0.4

## 2014-12-09 MED ORDER — SODIUM CHLORIDE 0.9 % IV SOLN
250.0000 mL | Freq: Once | INTRAVENOUS | Status: AC
  Administered 2014-12-09: 250 mL via INTRAVENOUS

## 2014-12-09 MED ORDER — FUROSEMIDE 10 MG/ML IJ SOLN
20.0000 mg | Freq: Once | INTRAMUSCULAR | Status: AC
  Administered 2014-12-09: 20 mg via INTRAVENOUS

## 2014-12-09 NOTE — Telephone Encounter (Signed)
Per 01/12 [facility] lab add on .Marland Kitchen... KJ

## 2014-12-09 NOTE — Patient Instructions (Signed)

## 2014-12-09 NOTE — Patient Instructions (Signed)
Darbepoetin Alfa injection What is this medicine? DARBEPOETIN ALFA (dar be POE e tin AL fa) helps your body make more red blood cells. It is used to treat anemia caused by chronic kidney failure and chemotherapy. This medicine may be used for other purposes; ask your health care provider or pharmacist if you have questions. COMMON BRAND NAME(S): Aranesp What should I tell my health care provider before I take this medicine? They need to know if you have any of these conditions: -blood clotting disorders or history of blood clots -cancer patient not on chemotherapy -cystic fibrosis -heart disease, such as angina, heart failure, or a history of a heart attack -hemoglobin level of 12 g/dL or greater -high blood pressure -low levels of folate, iron, or vitamin B12 -seizures -an unusual or allergic reaction to darbepoetin, erythropoietin, albumin, hamster proteins, latex, other medicines, foods, dyes, or preservatives -pregnant or trying to get pregnant -breast-feeding How should I use this medicine? This medicine is for injection into a vein or under the skin. It is usually given by a health care professional in a hospital or clinic setting. If you get this medicine at home, you will be taught how to prepare and give this medicine. Do not shake the solution before you withdraw a dose. Use exactly as directed. Take your medicine at regular intervals. Do not take your medicine more often than directed. It is important that you put your used needles and syringes in a special sharps container. Do not put them in a trash can. If you do not have a sharps container, call your pharmacist or healthcare provider to get one. Talk to your pediatrician regarding the use of this medicine in children. While this medicine may be used in children as young as 1 year for selected conditions, precautions do apply. Overdosage: If you think you have taken too much of this medicine contact a poison control center or  emergency room at once. NOTE: This medicine is only for you. Do not share this medicine with others. What if I miss a dose? If you miss a dose, take it as soon as you can. If it is almost time for your next dose, take only that dose. Do not take double or extra doses. What may interact with this medicine? Do not take this medicine with any of the following medications: -epoetin alfa This list may not describe all possible interactions. Give your health care provider a list of all the medicines, herbs, non-prescription drugs, or dietary supplements you use. Also tell them if you smoke, drink alcohol, or use illegal drugs. Some items may interact with your medicine. What should I watch for while using this medicine? Visit your prescriber or health care professional for regular checks on your progress and for the needed blood tests and blood pressure measurements. It is especially important for the doctor to make sure your hemoglobin level is in the desired range, to limit the risk of potential side effects and to give you the best benefit. Keep all appointments for any recommended tests. Check your blood pressure as directed. Ask your doctor what your blood pressure should be and when you should contact him or her. As your body makes more red blood cells, you may need to take iron, folic acid, or vitamin B supplements. Ask your doctor or health care provider which products are right for you. If you have kidney disease continue dietary restrictions, even though this medication can make you feel better. Talk with your doctor or health   care professional about the foods you eat and the vitamins that you take. What side effects may I notice from receiving this medicine? Side effects that you should report to your doctor or health care professional as soon as possible: -allergic reactions like skin rash, itching or hives, swelling of the face, lips, or tongue -breathing problems -changes in vision -chest  pain -confusion, trouble speaking or understanding -feeling faint or lightheaded, falls -high blood pressure -muscle aches or pains -pain, swelling, warmth in the leg -rapid weight gain -severe headaches -sudden numbness or weakness of the face, arm or leg -trouble walking, dizziness, loss of balance or coordination -seizures (convulsions) -swelling of the ankles, feet, hands -unusually weak or tired Side effects that usually do not require medical attention (report to your doctor or health care professional if they continue or are bothersome): -diarrhea -fever, chills (flu-like symptoms) -headaches -nausea, vomiting -redness, stinging, or swelling at site where injected This list may not describe all possible side effects. Call your doctor for medical advice about side effects. You may report side effects to FDA at 1-800-FDA-1088. Where should I keep my medicine? Keep out of the reach of children. Store in a refrigerator between 2 and 8 degrees C (36 and 46 degrees F). Do not freeze. Do not shake. Throw away any unused portion if using a single-dose vial. Throw away any unused medicine after the expiration date. NOTE: This sheet is a summary. It may not cover all possible information. If you have questions about this medicine, talk to your doctor, pharmacist, or health care provider.  2015, Elsevier/[employer] Standard. (2008-10-28 10:23:57)  

## 2014-12-10 LAB — TYPE AND SCREEN
ABO/RH(D): A NEG
Antibody Screen: NEGATIVE
Unit division: 0
Unit division: 0

## 2014-12-11 ENCOUNTER — Other Ambulatory Visit: Payer: Self-pay | Admitting: Hematology and Oncology

## 2014-12-24 ENCOUNTER — Encounter: Payer: Self-pay | Admitting: Podiatry

## 2014-12-24 ENCOUNTER — Ambulatory Visit (INDEPENDENT_AMBULATORY_CARE_PROVIDER_SITE_OTHER): Payer: Medicare Other | Admitting: Podiatry

## 2014-12-24 DIAGNOSIS — M79671 Pain in right foot: Secondary | ICD-10-CM

## 2014-12-24 DIAGNOSIS — B351 Tinea unguium: Secondary | ICD-10-CM

## 2014-12-24 NOTE — Progress Notes (Signed)
Patient ID: [name], female   DOB: 19-Feb-1941, 74 y.o.   MRN: [id number]  Subjective: This patient presents complaining of painful toenails and right foot  Objective: Transmetatarsal indication left The toenails are incurvated, hypertrophic, discolored, and tender to palpation 1-5 right  Assessment: Symptomatic mycotic toenails 5  Plan: Debridement toenails 5 without a bleeding  Reappoint 3 months

## 2014-12-29 ENCOUNTER — Other Ambulatory Visit (HOSPITAL_COMMUNITY): Payer: Self-pay | Admitting: Cardiovascular Disease

## 2014-12-29 DIAGNOSIS — I6529 Occlusion and stenosis of unspecified carotid artery: Secondary | ICD-10-CM

## 2014-12-30 ENCOUNTER — Encounter: Payer: Self-pay | Admitting: Hematology and Oncology

## 2014-12-30 ENCOUNTER — Ambulatory Visit (HOSPITAL_BASED_OUTPATIENT_CLINIC_OR_DEPARTMENT_OTHER): Payer: Medicare Other

## 2014-12-30 ENCOUNTER — Telehealth: Payer: Self-pay | Admitting: Hematology and Oncology

## 2014-12-30 ENCOUNTER — Other Ambulatory Visit (HOSPITAL_BASED_OUTPATIENT_CLINIC_OR_DEPARTMENT_OTHER): Payer: Medicare Other

## 2014-12-30 ENCOUNTER — Ambulatory Visit (HOSPITAL_BASED_OUTPATIENT_CLINIC_OR_DEPARTMENT_OTHER): Payer: Medicare Other | Admitting: Hematology and Oncology

## 2014-12-30 VITALS — BP 136/47 | HR 79 | Temp 98.1°F | Resp 18 | Ht 64.0 in | Wt 172.8 lb

## 2014-12-30 DIAGNOSIS — D638 Anemia in other chronic diseases classified elsewhere: Secondary | ICD-10-CM

## 2014-12-30 DIAGNOSIS — D649 Anemia, unspecified: Secondary | ICD-10-CM

## 2014-12-30 DIAGNOSIS — D539 Nutritional anemia, unspecified: Secondary | ICD-10-CM

## 2014-12-30 DIAGNOSIS — D509 Iron deficiency anemia, unspecified: Secondary | ICD-10-CM

## 2014-12-30 LAB — CBC & DIFF AND RETIC
BASO%: 0.4 % (ref 0.0–2.0)
BASOS ABS: 0 10*3/uL (ref 0.0–0.1)
EOS ABS: 0.3 10*3/uL (ref 0.0–0.5)
EOS%: 3.2 % (ref 0.0–7.0)
HCT: 29.9 % — ABNORMAL LOW (ref 34.8–46.6)
HGB: 8.9 g/dL — ABNORMAL LOW (ref 11.6–15.9)
Immature Retic Fract: 19.4 % — ABNORMAL HIGH (ref 1.60–10.00)
LYMPH#: 1.9 10*3/uL (ref 0.9–3.3)
LYMPH%: 19.5 % (ref 14.0–49.7)
MCH: 24.8 pg — ABNORMAL LOW (ref 25.1–34.0)
MCHC: 29.8 g/dL — AB (ref 31.5–36.0)
MCV: 83.3 fL (ref 79.5–101.0)
MONO#: 0.8 10*3/uL (ref 0.1–0.9)
MONO%: 7.7 % (ref 0.0–14.0)
NEUT%: 69.2 % (ref 38.4–76.8)
NEUTROS ABS: 6.9 10*3/uL — AB (ref 1.5–6.5)
PLATELETS: 265 10*3/uL (ref 145–400)
RBC: 3.59 10*6/uL — ABNORMAL LOW (ref 3.70–5.45)
RDW: 18.7 % — ABNORMAL HIGH (ref 11.2–14.5)
Retic %: 5.1 % — ABNORMAL HIGH (ref 0.70–2.10)
Retic Ct Abs: 183.09 10*3/uL — ABNORMAL HIGH (ref 33.70–90.70)
WBC: 9.9 10*3/uL (ref 3.9–10.3)

## 2014-12-30 LAB — COMPREHENSIVE METABOLIC PANEL (CC13)
ALK PHOS: 91 U/L (ref 40–150)
ALT: 15 U/L (ref 0–55)
AST: 18 U/L (ref 5–34)
Albumin: 3.4 g/dL — ABNORMAL LOW (ref 3.5–5.0)
Anion Gap: 11 mEq/L (ref 3–11)
BUN: 30.7 mg/dL — ABNORMAL HIGH (ref 7.0–26.0)
CO2: 28 mEq/L (ref 22–29)
CREATININE: 1.5 mg/dL — AB (ref 0.6–1.1)
Calcium: 10.5 mg/dL — ABNORMAL HIGH (ref 8.4–10.4)
Chloride: 101 mEq/L (ref 98–109)
EGFR: 40 mL/min/{1.73_m2} — ABNORMAL LOW (ref >90)
Glucose: 236 mg/dl — ABNORMAL HIGH (ref 70–140)
POTASSIUM: 4.5 meq/L (ref 3.5–5.1)
Sodium: 141 mEq/L (ref 136–145)
TOTAL PROTEIN: 6.9 g/dL (ref 6.4–8.3)
Total Bilirubin: 0.22 mg/dL (ref 0.20–1.20)

## 2014-12-30 LAB — HOLD TUBE, BLOOD BANK

## 2014-12-30 MED ORDER — DARBEPOETIN ALFA 300 MCG/0.6ML IJ SOSY
300.0000 ug | PREFILLED_SYRINGE | Freq: Once | INTRAMUSCULAR | Status: AC
  Administered 2014-12-30: 300 ug via SUBCUTANEOUS
  Filled 2014-12-30: qty 0.6

## 2014-12-30 NOTE — Telephone Encounter (Signed)
Pt confirmed labs/ov per 02/02 [facility], gave pt AVS.... KJ,

## 2014-12-30 NOTE — Progress Notes (Signed)
[facility] OFFICE PROGRESS NOTE  KILPATRICK JR,[name] R, MD SUMMARY OF HEMATOLOGIC HISTORY:  She was found to have abnormal CBC from routine blood work. In 2012, she has normal hemoglobin. Since November 2012 until present time, she has recurrence of anemia requiring blood transfusion, with her blood counts as close to 6.2. She was placed on oral iron supplement for over a year. She also carries a diagnosis of autoimmune psoriatic arthritis and was placed on prednisone in the past. She denies recent chest pain on exertion, pre-syncopal episodes, or palpitations. She has mild weakness since the recent heart attack and shortness of breath on minimal exertion. Her last colonoscopy was several months ago in 2015 with virtual colonoscopy for positive fecal occult blood. Prior endoscopy in June 2011 revealed significant esophagitis. It was repeated again in April 2015 which was unremarkable.  The patient was prescribed oral iron supplements and she takes one tablet twice a day with breakfast and dinner. It is causing some abdominal bloating and constipation. In October 2015, she was given dose of intravenous iron Feraheme without improvement of anemia. On 10/28/2014, she was started on Darbopoeitin 200 g every 3 weeks. On 12/30/2014, dose of darbepoetin was increased to 300 g every 2 weeks. INTERVAL HISTORY: [name] 74 y.o. female returns for further follow-up. She complained of fatigue. The patient denies any recent signs or symptoms of bleeding such as spontaneous epistaxis, hematuria or hematochezia.   I have reviewed the past medical history, past surgical history, social history and family history with the patient and they are unchanged from previous note.  ALLERGIES:  is allergic to amlodipine besy-benazepril hcl; statins; tradjenta; plavix; and raptiva.  MEDICATIONS:  Current Outpatient Prescriptions  Medication Sig Dispense Refill  . albuterol (PROVENTIL  HFA;VENTOLIN HFA) 108 (90 BASE) MCG/ACT inhaler Inhale 1 puff into the lungs every 6 (six) hours as needed. FOR SHORTNESS OF BREATH    . ALPRAZolam (XANAX) 0.5 MG tablet Take 0.25 mg by mouth 2 (two) times daily as needed for anxiety or sleep.    Marland Kitchen ammonium lactate (LAC-HYDRIN) 12 % lotion Apply 1 application topically daily as needed for dry skin (psorias).     Marland Kitchen aspirin 81 MG tablet Take 81 mg by mouth daily.    . brimonidine (ALPHAGAN) 0.2 % ophthalmic solution Place 1 drop into the right eye 2 (two) times daily.   2  . carvedilol (COREG) 3.125 MG tablet Take 1 tablet (3.125 mg total) by mouth 2 (two) times daily with a meal. 180 tablet 3  . Darbepoetin Alfa (ARANESP, ALBUMIN FREE, IJ) Inject as directed every 21 ( twenty-one) days.    Marland Kitchen esomeprazole (NEXIUM) 40 MG capsule Take 40 mg by mouth daily before breakfast.    . feeding supplement, GLUCERNA SHAKE, (GLUCERNA SHAKE) LIQD Take 237 mLs by mouth daily.     . ferrous sulfate 325 (65 FE) MG tablet Take 1 tablet (325 mg total) by mouth 2 (two) times daily with a meal. 60 tablet 2  . fluticasone (FLONASE) 50 MCG/ACT nasal spray Place 1 spray into both nostrils at bedtime as needed for allergies.     . Fluticasone-Salmeterol (ADVAIR HFA IN) Inhale into the lungs. 2 puffs in am ,1 puff in evening    . furosemide (LASIX) 40 MG tablet Take 1 tablet (40 mg total) by mouth daily. 30 tablet   . glimepiride (AMARYL) 2 MG tablet     . ipratropium-albuterol (DUONEB) 0.5-2.5 (3) MG/3ML SOLN Take 3 mLs by  nebulization every 4 (four) hours as needed (shortness of breath).    . Liniments ([employer] ARTHRITIS PAIN RELIEVER EX) Apply 1 application topically daily as needed (for shoulder pains).    . metFORMIN (GLUCOPHAGE) 500 MG tablet Take 500 mg by mouth 2 (two) times daily with a meal.    . Multiple Vitamins-Minerals (ICAPS) CAPS Take 1 capsule by mouth daily.    Marland Kitchen nystatin (MYCOSTATIN) 100000 UNIT/ML suspension Use as directed 5 mLs in the mouth or throat every 8  (eight) hours as needed.   1  . predniSONE (DELTASONE) 5 MG tablet Take 5 mg by mouth every Monday, Wednesday, and Friday.     . ranitidine (ZANTAC) 150 MG capsule Take 150 mg by mouth at bedtime.     . simethicone (MYLICON) 80 MG chewable tablet Chew 1 tablet (80 mg total) by mouth 4 (four) times daily as needed for flatulence. 30 tablet 0  . ticagrelor (BRILINTA) 90 MG TABS tablet Take 1 tablet (90 mg total) by mouth 2 (two) times daily. 180 tablet 3  . Travoprost, BAK Free, (TRAVATAMN) 0.004 % SOLN ophthalmic solution Place 1 drop into both eyes at bedtime.      No current facility-administered medications for this visit.     REVIEW OF SYSTEMS:   Constitutional: Denies fevers, chills or night sweats Eyes: Denies blurriness of vision Ears, nose, mouth, throat, and face: Denies mucositis or sore throat Respiratory: Denies cough, dyspnea or wheezes Cardiovascular: Denies palpitation, chest discomfort or lower extremity swelling Gastrointestinal:  Denies nausea, heartburn or change in bowel habits Skin: Denies abnormal skin rashes Lymphatics: Denies new lymphadenopathy or easy bruising Neurological:Denies numbness, tingling or new weaknesses Behavioral/Psych: Mood is stable, no new changes  All other systems were reviewed with the patient and are negative.  PHYSICAL EXAMINATION: ECOG PERFORMANCE STATUS: 0 - Asymptomatic  Filed Vitals:   12/30/14 1217  BP: 136/47  Pulse: 79  Temp: 98.1 F (36.7 C)  Resp: 18   Filed Weights   12/30/14 1217  Weight: 172 lb 12.8 oz (78.382 kg)    GENERAL:alert, no distress and comfortable SKIN: skin color, texture, turgor are normal, no rashes or significant lesions EYES: normal, Conjunctiva are pink and non-injected, sclera clear OROPHARYNX:no exudate, no erythema and lips, buccal mucosa, and tongue normal  NECK: supple, thyroid normal size, non-tender, without nodularity LYMPH:  no palpable lymphadenopathy in the cervical, axillary or  inguinal LUNGS: clear to auscultation and percussion with normal breathing effort HEART: regular rate & rhythm and no murmurs and no lower extremity edema ABDOMEN:abdomen soft, non-tender and normal bowel sounds Musculoskeletal:no cyanosis of digits and no clubbing  NEURO: alert & oriented x 3 with fluent speech, no focal motor/sensory deficits  LABORATORY DATA:  I have reviewed the data as listed Results for orders placed or performed in visit on 12/30/14 (from the past 48 hour(s))  CBC & Diff and Retic     Status: Abnormal   Collection Time: 12/30/14 10:29 AM  Result Value Ref Range   WBC 9.9 3.9 - 10.3 10e3/uL   NEUT# 6.9 (H) 1.5 - 6.5 10e3/uL   HGB 8.9 (L) 11.6 - 15.9 g/dL   HCT 29.9 (L) 34.8 - 46.6 %   Platelets 265 145 - 400 10e3/uL   MCV 83.3 79.5 - 101.0 fL   MCH 24.8 (L) 25.1 - 34.0 pg   MCHC 29.8 (L) 31.5 - 36.0 g/dL   RBC 3.59 (L) 3.70 - 5.45 10e6/uL   RDW 18.7 (H) 11.2 -  14.5 %   lymph# 1.9 0.9 - 3.3 10e3/uL   MONO# 0.8 0.1 - 0.9 10e3/uL   Eosinophils Absolute 0.3 0.0 - 0.5 10e3/uL   Basophils Absolute 0.0 0.0 - 0.1 10e3/uL   NEUT% 69.2 38.4 - 76.8 %   LYMPH% 19.5 14.0 - 49.7 %   MONO% 7.7 0.0 - 14.0 %   EOS% 3.2 0.0 - 7.0 %   BASO% 0.4 0.0 - 2.0 %   Retic % 5.10 (H) 0.70 - 2.10 %   Retic Ct Abs 183.09 (H) 33.70 - 90.70 10e3/uL   Immature Retic Fract 19.40 (H) 1.60 - 10.00 %  Hold Tube, Blood Bank     Status: None   Collection Time: 12/30/14 10:29 AM  Result Value Ref Range   Hold Tube, Blood Bank Blood Bank Order Cancelled   Comprehensive metabolic panel     Status: Abnormal   Collection Time: 12/30/14 10:30 AM  Result Value Ref Range   Sodium 141 136 - 145 mEq/L   Potassium 4.5 3.5 - 5.1 mEq/L   Chloride 101 98 - 109 mEq/L   CO2 28 22 - 29 mEq/L   Glucose 236 (H) 70 - 140 mg/dl   BUN 30.7 (H) 7.0 - 26.0 mg/dL   Creatinine 1.5 (H) 0.6 - 1.1 mg/dL   Total Bilirubin 0.22 0.20 - 1.20 mg/dL   Alkaline Phosphatase 91 40 - 150 U/L   AST 18 5 - 34 U/L   ALT  15 0 - 55 U/L   Total Protein 6.9 6.4 - 8.3 g/dL   Albumin 3.4 (L) 3.5 - 5.0 g/dL   Calcium 10.5 (H) 8.4 - 10.4 mg/dL   Anion Gap 11 3 - 11 mEq/L   EGFR 40 (L) >90 ml/min/1.73 m2    Comment: eGFR is calculated using the CKD-EPI Creatinine Equation (2009)    Lab Results  Component Value Date   WBC 9.9 12/30/2014   HGB 8.9* 12/30/2014   HCT 29.9* 12/30/2014   MCV 83.3 12/30/2014   PLT 265 12/30/2014   ASSESSMENT & PLAN:  Deficiency anemia I believe the cause of her anemia is multifactorial, likely component of anemia chronic disease and iron deficiency. After intravenous iron infusion, she has not improved. I recommend the use of erythropoeitin stimulating agents. We discussed the risks, benefits, side effects of erythropoietin stimulating agents for anemia, with the goal of keeping the hemoglobin greater than 10 g. I discussed with the patient and potential side effects such as risk of thrombosis, severe hypertension, risk of congestive heart failure and stroke and she agreed to proceed. I recommend 200 mcg every 3 weeks. It has not improved much.  I recommend increasing the dose to 300 g every 2 weeks and we will reassess her response rates after 4 treatments and determine whether dosage adjustment is needed. I will see the patient back in 2 months. She will continue oral iron supplement daily.    All questions were answered. The patient knows to call the clinic with any problems, questions or concerns. No barriers to learning was detected.  I spent 15 minutes counseling the patient face to face. The total time spent in the appointment was 20 minutes and more than 50% was on counseling.     [facility], Amrutha Avera, MD 2/2/20162:43 PM

## 2014-12-30 NOTE — Assessment & Plan Note (Signed)
I believe the cause of her anemia is multifactorial, likely component of anemia chronic disease and iron deficiency. After intravenous iron infusion, she has not improved. I recommend the use of erythropoeitin stimulating agents. We discussed the risks, benefits, side effects of erythropoietin stimulating agents for anemia, with the goal of keeping the hemoglobin greater than 10 g. I discussed with the patient and potential side effects such as risk of thrombosis, severe hypertension, risk of congestive heart failure and stroke and she agreed to proceed. I recommend 200 mcg every 3 weeks. It has not improved much.  I recommend increasing the dose to 300 g every 2 weeks and we will reassess her response rates after 4 treatments and determine whether dosage adjustment is needed. I will see the patient back in 2 months. She will continue oral iron supplement daily.

## 2015-01-07 ENCOUNTER — Ambulatory Visit (HOSPITAL_COMMUNITY)
Admission: RE | Admit: 2015-01-07 | Discharge: 2015-01-07 | Disposition: A | Discharge status: Home / Self Care | Payer: Medicare Other | Source: Ambulatory Visit | Attending: Cardiology | Admitting: Cardiology

## 2015-01-07 ENCOUNTER — Other Ambulatory Visit (HOSPITAL_BASED_OUTPATIENT_CLINIC_OR_DEPARTMENT_OTHER): Payer: Medicare Other

## 2015-01-07 ENCOUNTER — Inpatient Hospital Stay (HOSPITAL_COMMUNITY): Admission: RE | Admit: 2015-01-07 | Payer: Medicare Other | Source: Ambulatory Visit

## 2015-01-07 ENCOUNTER — Other Ambulatory Visit: Payer: Self-pay | Admitting: *Deleted

## 2015-01-07 DIAGNOSIS — D509 Iron deficiency anemia, unspecified: Secondary | ICD-10-CM

## 2015-01-07 DIAGNOSIS — I6529 Occlusion and stenosis of unspecified carotid artery: Secondary | ICD-10-CM | POA: Insufficient documentation

## 2015-01-07 DIAGNOSIS — D649 Anemia, unspecified: Secondary | ICD-10-CM

## 2015-01-07 DIAGNOSIS — D638 Anemia in other chronic diseases classified elsewhere: Secondary | ICD-10-CM

## 2015-01-07 LAB — CBC & DIFF AND RETIC
BASO%: 0.4 % (ref 0.0–2.0)
BASOS ABS: 0.1 10*3/uL (ref 0.0–0.1)
EOS ABS: 0.3 10*3/uL (ref 0.0–0.5)
EOS%: 2.8 % (ref 0.0–7.0)
HCT: 28.8 % — ABNORMAL LOW (ref 34.8–46.6)
HEMOGLOBIN: 8.6 g/dL — AB (ref 11.6–15.9)
Immature Retic Fract: 23.8 % — ABNORMAL HIGH (ref 1.60–10.00)
LYMPH%: 10.8 % — ABNORMAL LOW (ref 14.0–49.7)
MCH: 25.4 pg (ref 25.1–34.0)
MCHC: 29.9 g/dL — ABNORMAL LOW (ref 31.5–36.0)
MCV: 85 fL (ref 79.5–101.0)
MONO#: 0.6 10*3/uL (ref 0.1–0.9)
MONO%: 4.9 % (ref 0.0–14.0)
NEUT%: 81.1 % — ABNORMAL HIGH (ref 38.4–76.8)
NEUTROS ABS: 9.2 10*3/uL — AB (ref 1.5–6.5)
Platelets: 246 10*3/uL (ref 145–400)
RBC: 3.39 10*6/uL — ABNORMAL LOW (ref 3.70–5.45)
RDW: 21.8 % — ABNORMAL HIGH (ref 11.2–14.5)
Retic %: 7.07 % — ABNORMAL HIGH (ref 0.70–2.10)
Retic Ct Abs: 239.67 10*3/uL — ABNORMAL HIGH (ref 33.70–90.70)
WBC: 11.4 10*3/uL — ABNORMAL HIGH (ref 3.9–10.3)
lymph#: 1.2 10*3/uL (ref 0.9–3.3)

## 2015-01-07 LAB — HOLD TUBE, BLOOD BANK

## 2015-01-07 NOTE — Progress Notes (Signed)
Carotid Duplex Completed. °Brianna L Mazza,RVT °

## 2015-01-07 NOTE — Progress Notes (Signed)
S/w pt in lobby and informed her of no need for transfusion today.  Gave her copy of CBC.  Instructed her to come back as scheduled on 2/19.  Pt verbalized understanding,  States she still feels very tired and fatigued.  Instructed her to call us if her symptoms worsen and we can recheck her sooner than 2/19 if needed. She verbalized understanding.

## 2015-01-16 ENCOUNTER — Ambulatory Visit (HOSPITAL_BASED_OUTPATIENT_CLINIC_OR_DEPARTMENT_OTHER): Payer: Medicare Other

## 2015-01-16 ENCOUNTER — Telehealth: Payer: Self-pay | Admitting: Hematology and Oncology

## 2015-01-16 ENCOUNTER — Other Ambulatory Visit: Payer: Self-pay | Admitting: Hematology and Oncology

## 2015-01-16 ENCOUNTER — Other Ambulatory Visit (HOSPITAL_BASED_OUTPATIENT_CLINIC_OR_DEPARTMENT_OTHER): Payer: Medicare Other

## 2015-01-16 DIAGNOSIS — D509 Iron deficiency anemia, unspecified: Secondary | ICD-10-CM

## 2015-01-16 DIAGNOSIS — D649 Anemia, unspecified: Secondary | ICD-10-CM

## 2015-01-16 DIAGNOSIS — D638 Anemia in other chronic diseases classified elsewhere: Secondary | ICD-10-CM

## 2015-01-16 DIAGNOSIS — D539 Nutritional anemia, unspecified: Secondary | ICD-10-CM

## 2015-01-16 LAB — CBC & DIFF AND RETIC
BASO%: 0.3 % (ref 0.0–2.0)
BASOS ABS: 0 10*3/uL (ref 0.0–0.1)
EOS%: 2.9 % (ref 0.0–7.0)
Eosinophils Absolute: 0.3 10*3/uL (ref 0.0–0.5)
HEMATOCRIT: 28.5 % — AB (ref 34.8–46.6)
HGB: 8.3 g/dL — ABNORMAL LOW (ref 11.6–15.9)
Immature Retic Fract: 21.5 % — ABNORMAL HIGH (ref 1.60–10.00)
LYMPH%: 11.7 % — ABNORMAL LOW (ref 14.0–49.7)
MCH: 25.2 pg (ref 25.1–34.0)
MCHC: 29.1 g/dL — AB (ref 31.5–36.0)
MCV: 86.4 fL (ref 79.5–101.0)
MONO#: 0.6 10*3/uL (ref 0.1–0.9)
MONO%: 6 % (ref 0.0–14.0)
NEUT#: 7.5 10*3/uL — ABNORMAL HIGH (ref 1.5–6.5)
NEUT%: 79.1 % — AB (ref 38.4–76.8)
Platelets: 217 10*3/uL (ref 145–400)
RBC: 3.3 10*6/uL — ABNORMAL LOW (ref 3.70–5.45)
RDW: 21.6 % — ABNORMAL HIGH (ref 11.2–14.5)
Retic %: 7.04 % — ABNORMAL HIGH (ref 0.70–2.10)
Retic Ct Abs: 232.32 10*3/uL — ABNORMAL HIGH (ref 33.70–90.70)
WBC: 9.5 10*3/uL (ref 3.9–10.3)
lymph#: 1.1 10*3/uL (ref 0.9–3.3)

## 2015-01-16 LAB — HOLD TUBE, BLOOD BANK

## 2015-01-16 MED ORDER — DARBEPOETIN ALFA 300 MCG/0.6ML IJ SOSY
300.0000 ug | PREFILLED_SYRINGE | Freq: Once | INTRAMUSCULAR | Status: AC
  Administered 2015-01-16: 300 ug via SUBCUTANEOUS
  Filled 2015-01-16: qty 0.6

## 2015-01-16 NOTE — Telephone Encounter (Signed)
Pt confirmed labs, gave pt AVS and calendar... [name]

## 2015-01-20 ENCOUNTER — Encounter: Payer: Self-pay | Admitting: Cardiovascular Disease

## 2015-01-20 NOTE — Telephone Encounter (Signed)
Pt would like doppler results from 01-07-15 please.

## 2015-01-20 NOTE — Telephone Encounter (Signed)
This encounter was created in error - please disregard.

## 2015-01-23 ENCOUNTER — Other Ambulatory Visit: Payer: Self-pay | Admitting: *Deleted

## 2015-01-23 ENCOUNTER — Telehealth: Payer: Self-pay | Admitting: Hematology and Oncology

## 2015-01-23 ENCOUNTER — Other Ambulatory Visit: Payer: Self-pay | Admitting: Hematology and Oncology

## 2015-01-23 ENCOUNTER — Ambulatory Visit (HOSPITAL_BASED_OUTPATIENT_CLINIC_OR_DEPARTMENT_OTHER): Payer: Medicare Other

## 2015-01-23 ENCOUNTER — Ambulatory Visit (HOSPITAL_COMMUNITY)
Admission: RE | Admit: 2015-01-23 | Discharge: 2015-01-23 | Disposition: A | Discharge status: Home / Self Care | Payer: Medicare Other | Source: Ambulatory Visit | Attending: Hematology and Oncology | Admitting: Hematology and Oncology

## 2015-01-23 ENCOUNTER — Other Ambulatory Visit (HOSPITAL_BASED_OUTPATIENT_CLINIC_OR_DEPARTMENT_OTHER): Payer: Medicare Other

## 2015-01-23 VITALS — BP 146/58 | HR 70 | Temp 98.8°F | Resp 18

## 2015-01-23 DIAGNOSIS — D509 Iron deficiency anemia, unspecified: Secondary | ICD-10-CM | POA: Insufficient documentation

## 2015-01-23 DIAGNOSIS — D539 Nutritional anemia, unspecified: Secondary | ICD-10-CM | POA: Diagnosis present

## 2015-01-23 DIAGNOSIS — D638 Anemia in other chronic diseases classified elsewhere: Secondary | ICD-10-CM

## 2015-01-23 LAB — CBC & DIFF AND RETIC
BASO%: 0.7 % (ref 0.0–2.0)
BASOS ABS: 0.1 10*3/uL (ref 0.0–0.1)
EOS ABS: 0.3 10*3/uL (ref 0.0–0.5)
EOS%: 3.1 % (ref 0.0–7.0)
HEMATOCRIT: 27.2 % — AB (ref 34.8–46.6)
HEMOGLOBIN: 8 g/dL — AB (ref 11.6–15.9)
Immature Retic Fract: 28.7 % — ABNORMAL HIGH (ref 1.60–10.00)
LYMPH%: 8.6 % — ABNORMAL LOW (ref 14.0–49.7)
MCH: 25.2 pg (ref 25.1–34.0)
MCHC: 29.5 g/dL — ABNORMAL LOW (ref 31.5–36.0)
MCV: 85.3 fL (ref 79.5–101.0)
MONO#: 0.6 10*3/uL (ref 0.1–0.9)
MONO%: 6.8 % (ref 0.0–14.0)
NEUT#: 6.8 10*3/uL — ABNORMAL HIGH (ref 1.5–6.5)
NEUT%: 80.8 % — ABNORMAL HIGH (ref 38.4–76.8)
PLATELETS: 202 10*3/uL (ref 145–400)
RBC: 3.18 10*6/uL — ABNORMAL LOW (ref 3.70–5.45)
RDW: 24.3 % — ABNORMAL HIGH (ref 11.2–14.5)
Retic %: 8.26 % — ABNORMAL HIGH (ref 0.70–2.10)
Retic Ct Abs: 262.67 10*3/uL — ABNORMAL HIGH (ref 33.70–90.70)
WBC: 8.4 10*3/uL (ref 3.9–10.3)
lymph#: 0.7 10*3/uL — ABNORMAL LOW (ref 0.9–3.3)

## 2015-01-23 LAB — PREPARE RBC (CROSSMATCH)

## 2015-01-23 LAB — COMPREHENSIVE METABOLIC PANEL (CC13)
ALT: 12 U/L (ref 0–55)
AST: 15 U/L (ref 5–34)
Albumin: 3.3 g/dL — ABNORMAL LOW (ref 3.5–5.0)
Alkaline Phosphatase: 83 U/L (ref 40–150)
Anion Gap: 10 mEq/L (ref 3–11)
BILIRUBIN TOTAL: 0.25 mg/dL (ref 0.20–1.20)
BUN: 28.8 mg/dL — ABNORMAL HIGH (ref 7.0–26.0)
CO2: 26 meq/L (ref 22–29)
Calcium: 9.7 mg/dL (ref 8.4–10.4)
Chloride: 103 mEq/L (ref 98–109)
Creatinine: 1.4 mg/dL — ABNORMAL HIGH (ref 0.6–1.1)
EGFR: 42 mL/min/{1.73_m2} — ABNORMAL LOW (ref >90)
GLUCOSE: 285 mg/dL — AB (ref 70–140)
Potassium: 5 mEq/L (ref 3.5–5.1)
Sodium: 139 mEq/L (ref 136–145)
Total Protein: 6.4 g/dL (ref 6.4–8.3)

## 2015-01-23 LAB — FERRITIN CHCC: Ferritin: 23 ng/ml (ref 9–269)

## 2015-01-23 LAB — HOLD TUBE, BLOOD BANK

## 2015-01-23 MED ORDER — SODIUM CHLORIDE 0.9 % IV SOLN
250.0000 mL | Freq: Once | INTRAVENOUS | Status: AC
  Administered 2015-01-23: 250 mL via INTRAVENOUS

## 2015-01-23 NOTE — Patient Instructions (Signed)

## 2015-01-23 NOTE — Telephone Encounter (Signed)
lvm fo rpt regarding to March addon iron....pt will get print out in tx

## 2015-01-24 LAB — TYPE AND SCREEN
ABO/RH(D): A NEG
Antibody Screen: NEGATIVE
Unit division: 0

## 2015-01-26 ENCOUNTER — Telehealth: Payer: Self-pay | Admitting: *Deleted

## 2015-01-26 NOTE — Telephone Encounter (Signed)
Per Dr. Alvy Bimler, please let her know I suspect GI bleed. I am referring him back to Dr. Henrene Pastor and will prescribe IV iron infusion next visit. This RN tried calling patient's home number but was unable to leave a message. If patient calls [facility], this message needs to be given to her.

## 2015-01-30 ENCOUNTER — Ambulatory Visit: Payer: Medicare Other

## 2015-01-30 ENCOUNTER — Ambulatory Visit (HOSPITAL_BASED_OUTPATIENT_CLINIC_OR_DEPARTMENT_OTHER): Payer: Medicare Other

## 2015-01-30 ENCOUNTER — Other Ambulatory Visit (HOSPITAL_BASED_OUTPATIENT_CLINIC_OR_DEPARTMENT_OTHER): Payer: Medicare Other

## 2015-01-30 DIAGNOSIS — D638 Anemia in other chronic diseases classified elsewhere: Secondary | ICD-10-CM

## 2015-01-30 DIAGNOSIS — D539 Nutritional anemia, unspecified: Secondary | ICD-10-CM

## 2015-01-30 DIAGNOSIS — D649 Anemia, unspecified: Secondary | ICD-10-CM

## 2015-01-30 DIAGNOSIS — D509 Iron deficiency anemia, unspecified: Secondary | ICD-10-CM

## 2015-01-30 LAB — CBC & DIFF AND RETIC
BASO%: 0.3 % (ref 0.0–2.0)
Basophils Absolute: 0 10*3/uL (ref 0.0–0.1)
EOS%: 2.9 % (ref 0.0–7.0)
Eosinophils Absolute: 0.3 10*3/uL (ref 0.0–0.5)
HCT: 34 % — ABNORMAL LOW (ref 34.8–46.6)
HGB: 10.3 g/dL — ABNORMAL LOW (ref 11.6–15.9)
Immature Retic Fract: 13.5 % — ABNORMAL HIGH (ref 1.60–10.00)
LYMPH%: 10.8 % — ABNORMAL LOW (ref 14.0–49.7)
MCH: 26.6 pg (ref 25.1–34.0)
MCHC: 30.3 g/dL — ABNORMAL LOW (ref 31.5–36.0)
MCV: 87.9 fL (ref 79.5–101.0)
MONO#: 0.4 10*3/uL (ref 0.1–0.9)
MONO%: 4.8 % (ref 0.0–14.0)
NEUT%: 81.2 % — ABNORMAL HIGH (ref 38.4–76.8)
NEUTROS ABS: 7 10*3/uL — AB (ref 1.5–6.5)
NRBC: 0 % (ref 0–0)
Platelets: 208 10*3/uL (ref 145–400)
RBC: 3.87 10*6/uL (ref 3.70–5.45)
RDW: 19.7 % — AB (ref 11.2–14.5)
RETIC %: 6.04 % — AB (ref 0.70–2.10)
Retic Ct Abs: 233.75 10*3/uL — ABNORMAL HIGH (ref 33.70–90.70)
WBC: 8.7 10*3/uL (ref 3.9–10.3)
lymph#: 0.9 10*3/uL (ref 0.9–3.3)

## 2015-01-30 LAB — HOLD TUBE, BLOOD BANK

## 2015-01-30 MED ORDER — DARBEPOETIN ALFA 300 MCG/0.6ML IJ SOSY
300.0000 ug | PREFILLED_SYRINGE | Freq: Once | INTRAMUSCULAR | Status: DC
  Filled 2015-01-30: qty 0.6

## 2015-01-30 MED ORDER — FERUMOXYTOL INJECTION 510 MG/17 ML
510.0000 mg | Freq: Once | INTRAVENOUS | Status: AC
  Administered 2015-01-30: 510 mg via INTRAVENOUS
  Filled 2015-01-30: qty 17

## 2015-01-30 NOTE — Progress Notes (Signed)
No aranesp needed this week.  Hgb 10.3

## 2015-01-30 NOTE — Patient Instructions (Signed)

## 2015-02-06 ENCOUNTER — Ambulatory Visit (HOSPITAL_BASED_OUTPATIENT_CLINIC_OR_DEPARTMENT_OTHER): Payer: Medicare Other

## 2015-02-06 ENCOUNTER — Other Ambulatory Visit: Payer: Self-pay | Admitting: Hematology and Oncology

## 2015-02-06 DIAGNOSIS — D539 Nutritional anemia, unspecified: Secondary | ICD-10-CM

## 2015-02-06 DIAGNOSIS — D649 Anemia, unspecified: Secondary | ICD-10-CM

## 2015-02-06 MED ORDER — FERUMOXYTOL INJECTION 510 MG/17 ML
510.0000 mg | Freq: Once | INTRAVENOUS | Status: AC
  Administered 2015-02-06: 510 mg via INTRAVENOUS
  Filled 2015-02-06: qty 17

## 2015-02-06 MED ORDER — SODIUM CHLORIDE 0.9 % IV SOLN
Freq: Once | INTRAVENOUS | Status: AC
  Administered 2015-02-06: 12:00:00 via INTRAVENOUS

## 2015-02-06 NOTE — Patient Instructions (Signed)

## 2015-02-13 ENCOUNTER — Other Ambulatory Visit (HOSPITAL_BASED_OUTPATIENT_CLINIC_OR_DEPARTMENT_OTHER): Payer: Medicare Other

## 2015-02-13 ENCOUNTER — Ambulatory Visit: Payer: Medicare Other

## 2015-02-13 DIAGNOSIS — D638 Anemia in other chronic diseases classified elsewhere: Secondary | ICD-10-CM

## 2015-02-13 DIAGNOSIS — D509 Iron deficiency anemia, unspecified: Secondary | ICD-10-CM

## 2015-02-13 DIAGNOSIS — D649 Anemia, unspecified: Secondary | ICD-10-CM

## 2015-02-13 DIAGNOSIS — D539 Nutritional anemia, unspecified: Secondary | ICD-10-CM

## 2015-02-13 LAB — COMPREHENSIVE METABOLIC PANEL (CC13)
ALK PHOS: 82 U/L (ref 40–150)
ALT: 18 U/L (ref 0–55)
AST: 19 U/L (ref 5–34)
Albumin: 3.3 g/dL — ABNORMAL LOW (ref 3.5–5.0)
Anion Gap: 10 mEq/L (ref 3–11)
BUN: 28 mg/dL — ABNORMAL HIGH (ref 7.0–26.0)
CO2: 25 mEq/L (ref 22–29)
CREATININE: 1.1 mg/dL (ref 0.6–1.1)
Calcium: 9.9 mg/dL (ref 8.4–10.4)
Chloride: 102 mEq/L (ref 98–109)
EGFR: 57 mL/min/{1.73_m2} — ABNORMAL LOW (ref >90)
Glucose: 211 mg/dl — ABNORMAL HIGH (ref 70–140)
Potassium: 4.5 mEq/L (ref 3.5–5.1)
SODIUM: 137 meq/L (ref 136–145)
TOTAL PROTEIN: 6.7 g/dL (ref 6.4–8.3)
Total Bilirubin: <0.2 mg/dL (ref 0.20–1.20)

## 2015-02-13 LAB — CBC & DIFF AND RETIC
BASO%: 0.5 % (ref 0.0–2.0)
Basophils Absolute: 0 10*3/uL (ref 0.0–0.1)
EOS ABS: 0.3 10*3/uL (ref 0.0–0.5)
EOS%: 3.7 % (ref 0.0–7.0)
HCT: 34 % — ABNORMAL LOW (ref 34.8–46.6)
HGB: 10.7 g/dL — ABNORMAL LOW (ref 11.6–15.9)
IMMATURE RETIC FRACT: 6 % (ref 1.60–10.00)
LYMPH%: 12.7 % — ABNORMAL LOW (ref 14.0–49.7)
MCH: 28.3 pg (ref 25.1–34.0)
MCHC: 31.5 g/dL (ref 31.5–36.0)
MCV: 89.9 fL (ref 79.5–101.0)
MONO#: 0.7 10*3/uL (ref 0.1–0.9)
MONO%: 7.5 % (ref 0.0–14.0)
NEUT#: 6.6 10*3/uL — ABNORMAL HIGH (ref 1.5–6.5)
NEUT%: 75.6 % (ref 38.4–76.8)
Platelets: 187 10*3/uL (ref 145–400)
RBC: 3.78 10*6/uL (ref 3.70–5.45)
RDW: 18.8 % — AB (ref 11.2–14.5)
Retic %: 4.82 % — ABNORMAL HIGH (ref 0.70–2.10)
Retic Ct Abs: 182.2 10*3/uL — ABNORMAL HIGH (ref 33.70–90.70)
WBC: 8.7 10*3/uL (ref 3.9–10.3)
lymph#: 1.1 10*3/uL (ref 0.9–3.3)

## 2015-02-13 LAB — HOLD TUBE, BLOOD BANK

## 2015-02-13 MED ORDER — DARBEPOETIN ALFA 300 MCG/0.6ML IJ SOSY: 300.0000 ug | PREFILLED_SYRINGE | Freq: Once | INTRAMUSCULAR | Status: DC

## 2015-02-27 ENCOUNTER — Ambulatory Visit (HOSPITAL_BASED_OUTPATIENT_CLINIC_OR_DEPARTMENT_OTHER): Payer: Medicare Other

## 2015-02-27 ENCOUNTER — Other Ambulatory Visit: Payer: Self-pay | Admitting: Hematology and Oncology

## 2015-02-27 ENCOUNTER — Encounter: Payer: Self-pay | Admitting: Hematology and Oncology

## 2015-02-27 ENCOUNTER — Other Ambulatory Visit (HOSPITAL_BASED_OUTPATIENT_CLINIC_OR_DEPARTMENT_OTHER): Payer: Medicare Other

## 2015-02-27 ENCOUNTER — Ambulatory Visit (HOSPITAL_BASED_OUTPATIENT_CLINIC_OR_DEPARTMENT_OTHER): Payer: Medicare Other | Admitting: Hematology and Oncology

## 2015-02-27 VITALS — BP 140/49 | HR 73 | Temp 98.0°F | Resp 18 | Ht 64.0 in | Wt 175.0 lb

## 2015-02-27 DIAGNOSIS — D649 Anemia, unspecified: Secondary | ICD-10-CM | POA: Diagnosis not present

## 2015-02-27 DIAGNOSIS — D539 Nutritional anemia, unspecified: Secondary | ICD-10-CM | POA: Diagnosis not present

## 2015-02-27 DIAGNOSIS — D638 Anemia in other chronic diseases classified elsewhere: Secondary | ICD-10-CM

## 2015-02-27 DIAGNOSIS — D509 Iron deficiency anemia, unspecified: Secondary | ICD-10-CM

## 2015-02-27 LAB — COMPREHENSIVE METABOLIC PANEL (CC13)
ALT: 20 U/L (ref 0–55)
ANION GAP: 10 meq/L (ref 3–11)
AST: 19 U/L (ref 5–34)
Albumin: 3.4 g/dL — ABNORMAL LOW (ref 3.5–5.0)
Alkaline Phosphatase: 79 U/L (ref 40–150)
BILIRUBIN TOTAL: 0.25 mg/dL (ref 0.20–1.20)
BUN: 28.6 mg/dL — ABNORMAL HIGH (ref 7.0–26.0)
CHLORIDE: 102 meq/L (ref 98–109)
CO2: 26 mEq/L (ref 22–29)
CREATININE: 1.3 mg/dL — AB (ref 0.6–1.1)
Calcium: 9.9 mg/dL (ref 8.4–10.4)
EGFR: 49 mL/min/{1.73_m2} — AB (ref >90)
Glucose: 219 mg/dl — ABNORMAL HIGH (ref 70–140)
Potassium: 4.8 mEq/L (ref 3.5–5.1)
SODIUM: 138 meq/L (ref 136–145)
Total Protein: 6.8 g/dL (ref 6.4–8.3)

## 2015-02-27 LAB — HOLD TUBE, BLOOD BANK

## 2015-02-27 LAB — CBC & DIFF AND RETIC
BASO%: 0.3 % (ref 0.0–2.0)
Basophils Absolute: 0 10*3/uL (ref 0.0–0.1)
EOS%: 3.7 % (ref 0.0–7.0)
Eosinophils Absolute: 0.3 10*3/uL (ref 0.0–0.5)
HCT: 31.7 % — ABNORMAL LOW (ref 34.8–46.6)
HEMOGLOBIN: 9.9 g/dL — AB (ref 11.6–15.9)
Immature Retic Fract: 11 % — ABNORMAL HIGH (ref 1.60–10.00)
LYMPH%: 13.8 % — ABNORMAL LOW (ref 14.0–49.7)
MCH: 28.7 pg (ref 25.1–34.0)
MCHC: 31.2 g/dL — ABNORMAL LOW (ref 31.5–36.0)
MCV: 91.9 fL (ref 79.5–101.0)
MONO#: 0.6 10*3/uL (ref 0.1–0.9)
MONO%: 6.2 % (ref 0.0–14.0)
NEUT#: 6.8 10*3/uL — ABNORMAL HIGH (ref 1.5–6.5)
NEUT%: 76 % (ref 38.4–76.8)
PLATELETS: 159 10*3/uL (ref 145–400)
RBC: 3.45 10*6/uL — ABNORMAL LOW (ref 3.70–5.45)
RDW: 18.3 % — AB (ref 11.2–14.5)
RETIC %: 5.26 % — AB (ref 0.70–2.10)
RETIC CT ABS: 181.47 10*3/uL — AB (ref 33.70–90.70)
WBC: 9 10*3/uL (ref 3.9–10.3)
lymph#: 1.2 10*3/uL (ref 0.9–3.3)

## 2015-02-27 MED ORDER — DARBEPOETIN ALFA 300 MCG/0.6ML IJ SOSY
300.0000 ug | PREFILLED_SYRINGE | Freq: Once | INTRAMUSCULAR | Status: AC
  Administered 2015-02-27: 300 ug via SUBCUTANEOUS
  Filled 2015-02-27: qty 0.6

## 2015-02-28 NOTE — Assessment & Plan Note (Signed)
I believe the cause of her anemia is multifactorial, likely component of anemia chronic disease and iron deficiency. After intravenous iron infusion, she has improved. I recommend the use of erythropoeitin stimulating agents. We discussed the risks, benefits, side effects of erythropoietin stimulating agents for anemia, with the goal of keeping the hemoglobin greater than 11 g. She will continue oral iron supplement daily. I will recheck iron studies next month. I recommend GI consult due to suspected Gi bleed with recurrent recent iron deficiency

## 2015-02-28 NOTE — Progress Notes (Signed)
[place] OFFICE PROGRESS NOTE  KILPATRICK Harding,[place] R, MD SUMMARY OF HEMATOLOGIC HISTORY:  She was found to have abnormal CBC from routine blood work. In 2012, she has normal hemoglobin. Since November 2012 until present time, she has recurrence of anemia requiring blood transfusion, with her blood counts as close to 6.2. She was placed on oral iron supplement for over a year. She also carries a diagnosis of autoimmune psoriatic arthritis and was placed on prednisone in the past. She denies recent chest pain on exertion, pre-syncopal episodes, or palpitations. She has mild weakness since the recent heart attack and shortness of breath on minimal exertion. Her last colonoscopy was several months ago in 2015 with virtual colonoscopy for positive fecal occult blood. Prior endoscopy in June 2011 revealed significant esophagitis. It was repeated again in April 2015 which was unremarkable.  The patient was prescribed oral iron supplements and she takes one tablet twice a day with breakfast and dinner. It is causing some abdominal bloating and constipation. In October 2015, she was given dose of intravenous iron Feraheme without improvement of anemia. On 10/28/2014, she was started on Darbopoeitin 200 g every 3 weeks. On 12/30/2014, dose of darbepoetin was increased to 300 g every 2 weeks. In February and March 2016, she received IV iron INTERVAL HISTORY: [name] 74 y.o. female returns for further follow-up. She feels better. The patient denies any recent signs or symptoms of bleeding such as spontaneous epistaxis, hematuria or hematochezia.   I have reviewed the past medical history, past surgical history, social history and family history with the patient and they are unchanged from previous note.  ALLERGIES:  is allergic to amlodipine besy-benazepril hcl; statins; tradjenta; plavix; and raptiva.  MEDICATIONS:  Current Outpatient Prescriptions  Medication Sig  Dispense Refill  . albuterol (PROVENTIL HFA;VENTOLIN HFA) 108 (90 BASE) MCG/ACT inhaler Inhale 1 puff into the lungs every 6 (six) hours as needed. FOR SHORTNESS OF BREATH    . ALPRAZolam (XANAX) 0.5 MG tablet Take 0.25 mg by mouth 2 (two) times daily as needed for anxiety or sleep.    Marland Kitchen ammonium lactate (LAC-HYDRIN) 12 % lotion Apply 1 application topically daily as needed for dry skin (psorias).     Marland Kitchen aspirin 81 MG tablet Take 81 mg by mouth daily.    . carvedilol (COREG) 3.125 MG tablet Take 1 tablet (3.125 mg total) by mouth 2 (two) times daily with a meal. 180 tablet 3  . Darbepoetin Alfa (ARANESP, ALBUMIN FREE, IJ) Inject as directed every 21 ( twenty-one) days.    . dorzolamide (TRUSOPT) 2 % ophthalmic solution Place 1 drop into the right eye 2 (two) times daily.    Marland Kitchen esomeprazole (NEXIUM) 40 MG capsule Take 40 mg by mouth daily before breakfast.    . feeding supplement, GLUCERNA SHAKE, (GLUCERNA SHAKE) LIQD Take 237 mLs by mouth daily.     . ferrous sulfate 325 (65 FE) MG tablet Take 1 tablet (325 mg total) by mouth 2 (two) times daily with a meal. 60 tablet 2  . fluticasone (FLONASE) 50 MCG/ACT nasal spray Place 1 spray into both nostrils at bedtime as needed for allergies.     . Fluticasone-Salmeterol (ADVAIR HFA IN) Inhale into the lungs. 2 puffs in am ,1 puff in evening    . furosemide (LASIX) 40 MG tablet Take 1 tablet (40 mg total) by mouth daily. 30 tablet   . glimepiride (AMARYL) 2 MG tablet     . ipratropium-albuterol (DUONEB) 0.5-2.5 (  3) MG/3ML SOLN Take 3 mLs by nebulization every 4 (four) hours as needed (shortness of breath).    . Liniments ([employer] ARTHRITIS PAIN RELIEVER EX) Apply 1 application topically daily as needed (for shoulder pains).    . metFORMIN (GLUCOPHAGE) 500 MG tablet Take 500 mg by mouth 2 (two) times daily with a meal.    . Multiple Vitamins-Minerals (ICAPS) CAPS Take 1 capsule by mouth daily.    Marland Kitchen nystatin (MYCOSTATIN) 100000 UNIT/ML suspension Use as directed  5 mLs in the mouth or throat every 8 (eight) hours as needed.   1  . predniSONE (DELTASONE) 5 MG tablet Take 5 mg by mouth every Monday, Wednesday, and Friday.     . ranitidine (ZANTAC) 150 MG capsule Take 150 mg by mouth at bedtime.     . simethicone (MYLICON) 80 MG chewable tablet Chew 1 tablet (80 mg total) by mouth 4 (four) times daily as needed for flatulence. 30 tablet 0  . ticagrelor (BRILINTA) 90 MG TABS tablet Take 1 tablet (90 mg total) by mouth 2 (two) times daily. 180 tablet 3  . Travoprost, BAK Free, (TRAVATAMN) 0.004 % SOLN ophthalmic solution Place 1 drop into both eyes at bedtime.      No current facility-administered medications for this visit.     REVIEW OF SYSTEMS:   Constitutional: Denies fevers, chills or night sweats Eyes: Denies blurriness of vision Ears, nose, mouth, throat, and face: Denies mucositis or sore throat Respiratory: Denies cough, dyspnea or wheezes Cardiovascular: Denies palpitation, chest discomfort or lower extremity swelling Gastrointestinal:  Denies nausea, heartburn or change in bowel habits Skin: Denies abnormal skin rashes Lymphatics: Denies new lymphadenopathy or easy bruising Neurological:Denies numbness, tingling or new weaknesses Behavioral/Psych: Mood is stable, no new changes  All other systems were reviewed with the patient and are negative.  PHYSICAL EXAMINATION: ECOG PERFORMANCE STATUS: 0 - Asymptomatic  Filed Vitals:   02/27/15 1024  BP: 140/49  Pulse: 73  Temp: 98 F (36.7 C)  Resp: 18   Filed Weights   02/27/15 1024  Weight: 175 lb (79.379 kg)    GENERAL:alert, no distress and comfortable SKIN: skin color, texture, turgor are normal, no rashes or significant lesions EYES: normal, Conjunctiva are pink and non-injected, sclera clear Musculoskeletal:no cyanosis of digits and no clubbing  NEURO: alert & oriented x 3 with fluent speech, no focal motor/sensory deficits  LABORATORY DATA:  I have reviewed the data as  listed Results for orders placed or performed in visit on 02/27/15 (from the past 48 hour(s))  Comprehensive metabolic panel     Status: Abnormal   Collection Time: 02/27/15  9:41 AM  Result Value Ref Range   Sodium 138 136 - 145 mEq/L   Potassium 4.8 3.5 - 5.1 mEq/L   Chloride 102 98 - 109 mEq/L   CO2 26 22 - 29 mEq/L   Glucose 219 (H) 70 - 140 mg/dl   BUN 28.6 (H) 7.0 - 26.0 mg/dL   Creatinine 1.3 (H) 0.6 - 1.1 mg/dL   Total Bilirubin 0.25 0.20 - 1.20 mg/dL   Alkaline Phosphatase 79 40 - 150 U/L   AST 19 5 - 34 U/L   ALT 20 0 - 55 U/L   Total Protein 6.8 6.4 - 8.3 g/dL   Albumin 3.4 (L) 3.5 - 5.0 g/dL   Calcium 9.9 8.4 - 10.4 mg/dL   Anion Gap 10 3 - 11 mEq/L   EGFR 49 (L) >90 ml/min/1.73 m2    Comment: eGFR is  calculated using the CKD-EPI Creatinine Equation (2009)  Hold Tube, Blood Bank     Status: None   Collection Time: 02/27/15  9:41 AM  Result Value Ref Range   Hold Tube, Blood Bank Blood Bank Order Cancelled   CBC & Diff and Retic     Status: Abnormal   Collection Time: 02/27/15  9:41 AM  Result Value Ref Range   WBC 9.0 3.9 - 10.3 10e3/uL   NEUT# 6.8 (H) 1.5 - 6.5 10e3/uL   HGB 9.9 (L) 11.6 - 15.9 g/dL   HCT 31.7 (L) 34.8 - 46.6 %   Platelets 159 145 - 400 10e3/uL   MCV 91.9 79.5 - 101.0 fL   MCH 28.7 25.1 - 34.0 pg   MCHC 31.2 (L) 31.5 - 36.0 g/dL   RBC 3.45 (L) 3.70 - 5.45 10e6/uL   RDW 18.3 (H) 11.2 - 14.5 %   lymph# 1.2 0.9 - 3.3 10e3/uL   MONO# 0.6 0.1 - 0.9 10e3/uL   Eosinophils Absolute 0.3 0.0 - 0.5 10e3/uL   Basophils Absolute 0.0 0.0 - 0.1 10e3/uL   NEUT% 76.0 38.4 - 76.8 %   LYMPH% 13.8 (L) 14.0 - 49.7 %   MONO% 6.2 0.0 - 14.0 %   EOS% 3.7 0.0 - 7.0 %   BASO% 0.3 0.0 - 2.0 %   Retic % 5.26 (H) 0.70 - 2.10 %   Retic Ct Abs 181.47 (H) 33.70 - 90.70 10e3/uL   Immature Retic Fract 11.00 (H) 1.60 - 10.00 %    Lab Results  Component Value Date   WBC 9.0 02/27/2015   HGB 9.9* 02/27/2015   HCT 31.7* 02/27/2015   MCV 91.9 02/27/2015   PLT 159  02/27/2015    ASSESSMENT & PLAN:  Deficiency anemia I believe the cause of her anemia is multifactorial, likely component of anemia chronic disease and iron deficiency. After intravenous iron infusion, she has improved. I recommend the use of erythropoeitin stimulating agents. We discussed the risks, benefits, side effects of erythropoietin stimulating agents for anemia, with the goal of keeping the hemoglobin greater than 11 g. She will continue oral iron supplement daily. I will recheck iron studies next month. I recommend GI consult due to suspected Gi bleed with recurrent recent iron deficiency     All questions were answered. The patient knows to call the clinic with any problems, questions or concerns. No barriers to learning was detected.  I spent 15 minutes counseling the patient face to face. The total time spent in the appointment was 20 minutes and more than 50% was on counseling.     [facility], Henrick Mcgue, MD 4/2/20163:43 PM

## 2015-03-03 ENCOUNTER — Encounter: Payer: Self-pay | Admitting: Internal Medicine

## 2015-03-03 ENCOUNTER — Ambulatory Visit (INDEPENDENT_AMBULATORY_CARE_PROVIDER_SITE_OTHER): Payer: Medicare Other | Admitting: Internal Medicine

## 2015-03-03 ENCOUNTER — Ambulatory Visit: Payer: Medicare Other | Admitting: Internal Medicine

## 2015-03-03 VITALS — BP 124/58 | HR 80 | Ht 63.5 in | Wt 174.0 lb

## 2015-03-03 DIAGNOSIS — D509 Iron deficiency anemia, unspecified: Secondary | ICD-10-CM

## 2015-03-03 DIAGNOSIS — R195 Other fecal abnormalities: Secondary | ICD-10-CM | POA: Diagnosis not present

## 2015-03-03 NOTE — Progress Notes (Signed)
HISTORY OF PRESENT ILLNESS:  [name] is a delightful 74 y.o. female with MULTIPLE SIGNIFICANT medical problems as listed below. She is sent today by her oncologist regarding anemia and intermittent Hemoccult positive stool. She has been evaluated for this problem on previous occasions. The patient was last seen in this office June 2015 with symptomatic anemia. No overt GI bleeding at that time. The month previous she underwent coronary artery intervention with drug-eluting stent and was placed on Brilinta. She continues on Brilinta and aspirin. Most recent upper endoscopy in April 2015 was normal. Most recent optical colonoscopy in 2007 revealed diverticulosis and hemorrhoids only. Virtual colonoscopy was performed June 2015. She was found to have diverticulosis without other abnormalities. Hematology consultation recommended. She has been seen and now followed by Dr. Alvy Bimler. Anemia has been determined to be multifactorial. Patient has been on chronic oral iron. Ferritin levels have fluctuated. She has received IV iron infusions. She has also received Aranesp injections on an a regular basis. Infrequent need for blood transfusions having received 1 unit in December and 2 units in January. Patient has chronic stable dark stools attributed to iron without obvious melena or hematochezia. Her hemoglobin is generally fluctuate between 8 and 10 g. Most recent hemoglobin 9.9. GI review of systems has multiple entries including chronic stable midabdominal fullness or discomfort, belching, gas, nausea, constipation alternating with diarrhea, and hemorrhoids  REVIEW OF SYSTEMS:  All non-GI ROS negative except for sinus and allergy, anxiety, arthritis, back pain, visual change, cough, fatigue, shortness of breath, heart murmur, headaches, skin rash, sore throat  Past Medical History  Diagnosis Date  . COPD (chronic obstructive pulmonary disease)   . Hypertension   . Asthma   . Psoriatic arthritis   .  Psoriasis   . GERD (gastroesophageal reflux disease)   . Hiatal hernia   . Chronic sinus infection   . Heart murmur   . Blood transfusion ~ 1962    "11 units of blood"  . Ankylosing spondylitis   . Chronic back pain   . Dysrhythmia     "palpitations"  . Esophageal dilatation   . Candida esophagitis   . Cirrhosis of liver without mention of alcohol   . Hypercholesteremia   . Peripheral vascular disease   . Exertional dyspnea   . Pneumonia 10/07/11; 2006; 1980's  . Chronic bronchitis     "since childhood" (12/06/2012)  . Type II diabetes mellitus   . Iron deficiency anemia   . External bleeding hemorrhoids   . Sinus headache   . Arthritis     "psorasic"  . OA (osteoarthritis)   . DJD (degenerative joint disease) of knee     "both" (12/06/2012)  . Carpal tunnel syndrome on right   . Anxiety   . Barrett esophagus   . Non-healing ulcer of foot, secondary to diabetes and PVD 12/07/2012  . PVD (peripheral vascular disease) 12/07/2012  . Tobacco abuse 12/07/2012  . Critical lower limb ischemia, with ulcer Lt foot 12/07/2012  . Gout   . COPD (chronic obstructive pulmonary disease)   . Carotid artery disease     dopplers showed mod L ICA stenosis   . PAD (peripheral artery disease)   . History of nuclear stress test 11/2012    lexiscan; normal, low risk   . Dermatophytosis of nail [id number]  . Psoriasis   . GI bleed   . Diverticulosis   . Pericardial effusion     moderate by 2-D echo without evidence of An odd  Past Surgical History  Procedure Laterality Date  . Nasal sinus surgery  09/29/2009  . Carpal tunnel release  2011    left hand  . Lipoma excision  2010    "left occipital area"  . Cataract extraction w/ intraocular lens implant  2009/2010    right/left  . Eye surgery    . Oophorectomy      'not sure which one"  . Appendectomy    . Angioplasty  12/10/2012    "LLE"; LSFA Diamondback orbital rotational arthrectomy, PTA , stenting with IDEV stent (left ABI improved  from 0.35 to 0.75)  . Abdominal hysterectomy  1976  . Transmetatarsal amputation Left 01/08/2013    Procedure: TRANSMETATARSAL AMPUTATION;  Surgeon: Angelia Mould, MD;  Location: [place];  Service: Vascular;  Laterality: Left;  . Esophagogastroduodenoscopy  2011  . Colonoscopy  2007    Hemorrhoids and Diverticulosis  . Esophagogastroduodenoscopy N/A 03/17/2014    Procedure: ESOPHAGOGASTRODUODENOSCOPY (EGD);  Surgeon: Irene Shipper, MD;  Location: [facility] ENDOSCOPY;  Service: Endoscopy;  Laterality: N/A;  . Lower extremity angiogram N/A 12/06/2012    Procedure: LOWER EXTREMITY ANGIOGRAM;  Surgeon: Lorretta Harp, MD;  Location: [facility] CATH LAB;  Service: Cardiovascular;  Laterality: N/A;  . Atherectomy N/A 12/10/2012    Procedure: ATHERECTOMY;  Surgeon: Lorretta Harp, MD;  Location: [facility] CATH LAB;  Service: Cardiovascular;  Laterality: N/A;  . Left heart catheterization with coronary angiogram N/A 04/09/2014    Procedure: LEFT HEART CATHETERIZATION WITH CORONARY ANGIOGRAM;  Surgeon: Sinclair Grooms, MD;  Location: [facility] CATH LAB;  Service: Cardiovascular;  Laterality: N/A;  . Percutaneous coronary rotoblator intervention (pci-r) N/A 04/16/2014    Procedure: PERCUTANEOUS CORONARY ROTOBLATOR INTERVENTION (PCI-R);  Surgeon: Blane Ohara, MD;  Location: [facility] CATH LAB;  Service: Cardiovascular;  Laterality: N/A;    Social History Mammie C Wolfert  reports that she has quit smoking. Her smoking use included Cigarettes. She has a 40 pack-year smoking history. She quit smokeless tobacco use about 17 months ago. She reports that she does not drink alcohol or use illicit drugs.  family history includes Arthritis in her brother and sister; Cancer in her mother and paternal grandfather; Diabetes in her brother; Heart Problems in her maternal grandmother; Lung cancer in her sister; Lupus in her sister; Thyroid disease in her sister. There is no history of Colon cancer.  Allergies  Allergen Reactions  .  Amlodipine Besy-Benazepril Hcl Other (See Comments)    Nervous/shakiness  . Statins Other (See Comments)    "can't take any of them; cramp me up" (12/06/2012)  . Tradjenta [Linagliptin] Other (See Comments)    Extreme muscle pains, chest and back pains  . Plavix [Clopidogrel] Rash    Severe burning of the skin.Pt states that the doctor told her that her rash was psoriasis and not related to plavix but she states the rash came from the plavix  . Raptiva [Efalizumab] Rash       PHYSICAL EXAMINATION: Vital signs: BP 124/58 mmHg  Pulse 80  Ht 5' 3.5" (1.613 m)  Wt 174 lb (78.926 kg)  BMI 30.34 kg/m2 General: Well-developed, obese, well-nourished, no acute distress HEENT: Sclerae are anicteric, conjunctiva pink. Oral mucosa intact Lungs: Clear Heart: Regular with soft systolic murmur Abdomen: soft, obese, nontender, nondistended, no obvious ascites, no peritoneal signs, normal bowel sounds. No organomegaly. Extremities: No edema Psychiatric: alert and oriented x3. Cooperative   ASSESSMENT:  #1. Multifactorial anemia. Mild chronic intermittent GI blood loss likely secondary to  small bowel AVMs or nonspecific mucosal oozing as is seen in patients on Brilinta and aspirin #2. Unremarkable prior upper endoscopy, colonoscopy, and virtual colonoscopy as described #3. Multiple significant medical problems #4. Coronary artery intervention May 2015. On Brilinta   PLAN:  #1. Capsule endoscopy. The nature of the procedure, as well as the risks, benefits, and alternatives were carefully and thoroughly reviewed with the patient. Ample time for discussion and questions allowed. The patient understood, was satisfied, and agreed to proceed.If significant mucosal pathology found, could consider endoscopic ablation (off anticoagulation). #2. Aranesp injection therapy regularly per hematology #3. Iron infusion therapy as needed per hematology #4. Regular monitoring of blood counts and as needed  transfusion for symptomatic anemia per hematology #5. Patient has upcoming appointment with her cardiologist. If Brilinta related bleeding determine, could consider risk-benefit ratio of halting that therapy one year after stent placement. Must defer to cardiology regarding risk #6. Ongoing general medical care with Dr. Katherine Roan

## 2015-03-03 NOTE — Patient Instructions (Signed)
You have been scheduled for a capsule endoscopy

## 2015-03-13 ENCOUNTER — Other Ambulatory Visit (HOSPITAL_BASED_OUTPATIENT_CLINIC_OR_DEPARTMENT_OTHER): Payer: Medicare Other

## 2015-03-13 ENCOUNTER — Ambulatory Visit (HOSPITAL_BASED_OUTPATIENT_CLINIC_OR_DEPARTMENT_OTHER): Payer: Medicare Other

## 2015-03-13 VITALS — BP 148/52 | HR 57 | Temp 98.3°F

## 2015-03-13 DIAGNOSIS — D539 Nutritional anemia, unspecified: Secondary | ICD-10-CM | POA: Diagnosis not present

## 2015-03-13 DIAGNOSIS — D649 Anemia, unspecified: Secondary | ICD-10-CM

## 2015-03-13 DIAGNOSIS — D509 Iron deficiency anemia, unspecified: Secondary | ICD-10-CM

## 2015-03-13 DIAGNOSIS — D638 Anemia in other chronic diseases classified elsewhere: Secondary | ICD-10-CM

## 2015-03-13 LAB — CBC & DIFF AND RETIC
BASO%: 0.2 % (ref 0.0–2.0)
Basophils Absolute: 0 10*3/uL (ref 0.0–0.1)
EOS ABS: 0.2 10*3/uL (ref 0.0–0.5)
EOS%: 2.6 % (ref 0.0–7.0)
HEMATOCRIT: 33.1 % — AB (ref 34.8–46.6)
HGB: 10.4 g/dL — ABNORMAL LOW (ref 11.6–15.9)
Immature Retic Fract: 13.2 % — ABNORMAL HIGH (ref 1.60–10.00)
LYMPH%: 11.7 % — ABNORMAL LOW (ref 14.0–49.7)
MCH: 29.5 pg (ref 25.1–34.0)
MCHC: 31.4 g/dL — ABNORMAL LOW (ref 31.5–36.0)
MCV: 93.8 fL (ref 79.5–101.0)
MONO#: 0.5 10*3/uL (ref 0.1–0.9)
MONO%: 5.3 % (ref 0.0–14.0)
NEUT#: 7.2 10*3/uL — ABNORMAL HIGH (ref 1.5–6.5)
NEUT%: 80.2 % — ABNORMAL HIGH (ref 38.4–76.8)
NRBC: 0 % (ref 0–0)
Platelets: 188 10*3/uL (ref 145–400)
RBC: 3.53 10*6/uL — AB (ref 3.70–5.45)
RDW: 17.2 % — AB (ref 11.2–14.5)
RETIC %: 7.05 % — AB (ref 0.70–2.10)
Retic Ct Abs: 248.87 10*3/uL — ABNORMAL HIGH (ref 33.70–90.70)
WBC: 8.9 10*3/uL (ref 3.9–10.3)
lymph#: 1.1 10*3/uL (ref 0.9–3.3)

## 2015-03-13 LAB — HOLD TUBE, BLOOD BANK

## 2015-03-13 LAB — FERRITIN CHCC: Ferritin: 201 ng/ml (ref 9–269)

## 2015-03-13 MED ORDER — DARBEPOETIN ALFA 300 MCG/0.6ML IJ SOSY
300.0000 ug | PREFILLED_SYRINGE | Freq: Once | INTRAMUSCULAR | Status: AC
  Administered 2015-03-13: 300 ug via SUBCUTANEOUS
  Filled 2015-03-13: qty 0.6

## 2015-03-19 ENCOUNTER — Ambulatory Visit (INDEPENDENT_AMBULATORY_CARE_PROVIDER_SITE_OTHER): Payer: Medicare Other | Admitting: Internal Medicine

## 2015-03-19 DIAGNOSIS — R195 Other fecal abnormalities: Secondary | ICD-10-CM

## 2015-03-19 DIAGNOSIS — D509 Iron deficiency anemia, unspecified: Secondary | ICD-10-CM | POA: Diagnosis not present

## 2015-03-19 NOTE — Progress Notes (Signed)
Patient here for capsule endoscopy. She completed the prep and has not eaten Tolerated procedure. Verbalizes understanding of written and verbal instructions. Lot [id number] Expires 2017-06

## 2015-03-23 ENCOUNTER — Encounter: Payer: Self-pay | Admitting: Podiatry

## 2015-03-23 ENCOUNTER — Ambulatory Visit (INDEPENDENT_AMBULATORY_CARE_PROVIDER_SITE_OTHER): Payer: Medicare Other | Admitting: Podiatry

## 2015-03-23 DIAGNOSIS — B351 Tinea unguium: Secondary | ICD-10-CM

## 2015-03-23 DIAGNOSIS — M79674 Pain in right toe(s): Secondary | ICD-10-CM | POA: Diagnosis not present

## 2015-03-24 NOTE — Progress Notes (Signed)
Patient ID: [name], female   DOB: January 12, 1941, 74 y.o.   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

## 2015-03-27 ENCOUNTER — Other Ambulatory Visit (HOSPITAL_BASED_OUTPATIENT_CLINIC_OR_DEPARTMENT_OTHER): Payer: Medicare Other

## 2015-03-27 ENCOUNTER — Ambulatory Visit (HOSPITAL_BASED_OUTPATIENT_CLINIC_OR_DEPARTMENT_OTHER): Payer: Medicare Other

## 2015-03-27 VITALS — BP 149/56 | HR 72 | Temp 98.4°F

## 2015-03-27 DIAGNOSIS — D649 Anemia, unspecified: Secondary | ICD-10-CM | POA: Diagnosis not present

## 2015-03-27 DIAGNOSIS — D539 Nutritional anemia, unspecified: Secondary | ICD-10-CM

## 2015-03-27 DIAGNOSIS — D509 Iron deficiency anemia, unspecified: Secondary | ICD-10-CM

## 2015-03-27 DIAGNOSIS — D638 Anemia in other chronic diseases classified elsewhere: Secondary | ICD-10-CM

## 2015-03-27 LAB — CBC & DIFF AND RETIC
BASO%: 0.5 % (ref 0.0–2.0)
BASOS ABS: 0 10*3/uL (ref 0.0–0.1)
EOS%: 4.8 % (ref 0.0–7.0)
Eosinophils Absolute: 0.4 10*3/uL (ref 0.0–0.5)
HEMATOCRIT: 34.1 % — AB (ref 34.8–46.6)
HGB: 10.4 g/dL — ABNORMAL LOW (ref 11.6–15.9)
Immature Retic Fract: 16.6 % — ABNORMAL HIGH (ref 1.60–10.00)
LYMPH%: 14.5 % (ref 14.0–49.7)
MCH: 28.4 pg (ref 25.1–34.0)
MCHC: 30.5 g/dL — AB (ref 31.5–36.0)
MCV: 93.2 fL (ref 79.5–101.0)
MONO#: 0.8 10*3/uL (ref 0.1–0.9)
MONO%: 9.1 % (ref 0.0–14.0)
NEUT%: 71.1 % (ref 38.4–76.8)
NEUTROS ABS: 6.3 10*3/uL (ref 1.5–6.5)
Platelets: 226 10*3/uL (ref 145–400)
RBC: 3.66 10*6/uL — ABNORMAL LOW (ref 3.70–5.45)
RDW: 16.2 % — ABNORMAL HIGH (ref 11.2–14.5)
RETIC %: 5.12 % — AB (ref 0.70–2.10)
RETIC CT ABS: 187.39 10*3/uL — AB (ref 33.70–90.70)
WBC: 8.9 10*3/uL (ref 3.9–10.3)
lymph#: 1.3 10*3/uL (ref 0.9–3.3)

## 2015-03-27 LAB — HOLD TUBE, BLOOD BANK

## 2015-03-27 MED ORDER — DARBEPOETIN ALFA 300 MCG/0.6ML IJ SOSY
300.0000 ug | PREFILLED_SYRINGE | Freq: Once | INTRAMUSCULAR | Status: AC
  Administered 2015-03-27: 300 ug via SUBCUTANEOUS
  Filled 2015-03-27: qty 0.6

## 2015-03-30 ENCOUNTER — Encounter: Payer: Self-pay | Admitting: Internal Medicine

## 2015-03-31 ENCOUNTER — Telehealth: Payer: Self-pay | Admitting: Internal Medicine

## 2015-03-31 ENCOUNTER — Other Ambulatory Visit: Payer: Self-pay

## 2015-03-31 DIAGNOSIS — D6489 Other specified anemias: Secondary | ICD-10-CM

## 2015-03-31 DIAGNOSIS — T189XXA Foreign body of alimentary tract, part unspecified, initial encounter: Secondary | ICD-10-CM

## 2015-03-31 NOTE — Telephone Encounter (Signed)
Dr Henrene Pastor would like to speak with a physician concerning [name] and her capsule endoscopy that showed an area that is oozing.  Dr Henrene Pastor was paged for Dr Stanford Breed to speak with him.

## 2015-03-31 NOTE — Telephone Encounter (Signed)
Find out her cardiologist. Ask the cardiologist's nurse to have him review her chart then give me a call. I can be reached on my pager. We can go from there. Thanks

## 2015-03-31 NOTE — Telephone Encounter (Signed)
Pt calling to see if she is going to have another procedure-enteroscopy. Please advise if you want pt scheduled.

## 2015-03-31 NOTE — Telephone Encounter (Signed)
Dr Stanford Breed spoke with Dr Henrene Pastor.  Due to patient oozing, Brilinta can be held, but. ASA must be continued. Patient's cardiac stenting was done 03/2014.

## 2015-03-31 NOTE — Telephone Encounter (Signed)
Vaughan Basta, I spoke with Dr. Stanford Breed. It is okay for this patient to stop her Brilinta now. However, she must stay on aspirin indefinitely. Schedule her for EGD/enteroscopy with APC and with propofol during the hospital week (not Monday or Thursday). Thank you

## 2015-03-31 NOTE — Telephone Encounter (Signed)
Spoke with pt and she is aware and knows to stary on Aspirin. Pt scheduled for EGD/entero at [facility] 04/14/15@9 :30am. Pt to arrive there at 8:30am. Pt knows to be NPO after midnight. Pt also states she does not recall passing the capsule. Pt to come for xray this week to see if she has passed the capsule. Dr. Henrene Pastor aware.

## 2015-03-31 NOTE — Telephone Encounter (Signed)
Spoke with Dr. Kennon Holter RN and he is out of the country for 2 weeks. Dr. Stanford Breed is their doc of the day, she will have him review the chart and call Dr. Henrene Pastor on his pager. Dr. Henrene Pastor aware.

## 2015-04-06 ENCOUNTER — Encounter (HOSPITAL_COMMUNITY): Payer: Self-pay | Admitting: *Deleted

## 2015-04-07 ENCOUNTER — Ambulatory Visit (INDEPENDENT_AMBULATORY_CARE_PROVIDER_SITE_OTHER)
Admission: RE | Admit: 2015-04-07 | Discharge: 2015-04-07 | Disposition: A | Discharge status: Home / Self Care | Payer: Medicare Other | Source: Ambulatory Visit | Attending: Internal Medicine | Admitting: Internal Medicine

## 2015-04-07 DIAGNOSIS — T189XXA Foreign body of alimentary tract, part unspecified, initial encounter: Secondary | ICD-10-CM

## 2015-04-10 ENCOUNTER — Encounter: Payer: Self-pay | Admitting: Hematology and Oncology

## 2015-04-10 ENCOUNTER — Ambulatory Visit (HOSPITAL_BASED_OUTPATIENT_CLINIC_OR_DEPARTMENT_OTHER): Payer: Medicare Other

## 2015-04-10 ENCOUNTER — Other Ambulatory Visit (HOSPITAL_BASED_OUTPATIENT_CLINIC_OR_DEPARTMENT_OTHER): Payer: Medicare Other

## 2015-04-10 ENCOUNTER — Ambulatory Visit (HOSPITAL_BASED_OUTPATIENT_CLINIC_OR_DEPARTMENT_OTHER): Payer: Medicare Other | Admitting: Hematology and Oncology

## 2015-04-10 ENCOUNTER — Telehealth: Payer: Self-pay | Admitting: Hematology and Oncology

## 2015-04-10 VITALS — BP 159/65 | HR 55 | Temp 98.1°F | Resp 17 | Ht 63.5 in | Wt 177.9 lb

## 2015-04-10 DIAGNOSIS — I10 Essential (primary) hypertension: Secondary | ICD-10-CM | POA: Diagnosis not present

## 2015-04-10 DIAGNOSIS — D649 Anemia, unspecified: Secondary | ICD-10-CM

## 2015-04-10 DIAGNOSIS — D539 Nutritional anemia, unspecified: Secondary | ICD-10-CM

## 2015-04-10 DIAGNOSIS — D509 Iron deficiency anemia, unspecified: Secondary | ICD-10-CM

## 2015-04-10 DIAGNOSIS — D638 Anemia in other chronic diseases classified elsewhere: Secondary | ICD-10-CM

## 2015-04-10 LAB — CBC & DIFF AND RETIC
BASO%: 0.3 % (ref 0.0–2.0)
Basophils Absolute: 0 10*3/uL (ref 0.0–0.1)
EOS%: 4 % (ref 0.0–7.0)
Eosinophils Absolute: 0.4 10*3/uL (ref 0.0–0.5)
HEMATOCRIT: 36.2 % (ref 34.8–46.6)
HEMOGLOBIN: 10.8 g/dL — AB (ref 11.6–15.9)
Immature Retic Fract: 23.8 % — ABNORMAL HIGH (ref 1.60–10.00)
LYMPH%: 12.2 % — ABNORMAL LOW (ref 14.0–49.7)
MCH: 27.8 pg (ref 25.1–34.0)
MCHC: 29.8 g/dL — ABNORMAL LOW (ref 31.5–36.0)
MCV: 93.3 fL (ref 79.5–101.0)
MONO#: 0.6 10*3/uL (ref 0.1–0.9)
MONO%: 5.8 % (ref 0.0–14.0)
NEUT%: 77.7 % — ABNORMAL HIGH (ref 38.4–76.8)
NEUTROS ABS: 7.6 10*3/uL — AB (ref 1.5–6.5)
Platelets: 182 10*3/uL (ref 145–400)
RBC: 3.88 10*6/uL (ref 3.70–5.45)
RDW: 16.5 % — ABNORMAL HIGH (ref 11.2–14.5)
Retic %: 5.33 % — ABNORMAL HIGH (ref 0.70–2.10)
Retic Ct Abs: 206.8 10*3/uL — ABNORMAL HIGH (ref 33.70–90.70)
WBC: 9.8 10*3/uL (ref 3.9–10.3)
lymph#: 1.2 10*3/uL (ref 0.9–3.3)

## 2015-04-10 LAB — COMPREHENSIVE METABOLIC PANEL (CC13)
ALT: 15 U/L (ref 0–55)
AST: 17 U/L (ref 5–34)
Albumin: 3.4 g/dL — ABNORMAL LOW (ref 3.5–5.0)
Alkaline Phosphatase: 78 U/L (ref 40–150)
Anion Gap: 8 mEq/L (ref 3–11)
BILIRUBIN TOTAL: 0.25 mg/dL (ref 0.20–1.20)
BUN: 18.7 mg/dL (ref 7.0–26.0)
CO2: 30 mEq/L — ABNORMAL HIGH (ref 22–29)
Calcium: 9.3 mg/dL (ref 8.4–10.4)
Chloride: 102 mEq/L (ref 98–109)
Creatinine: 1.2 mg/dL — ABNORMAL HIGH (ref 0.6–1.1)
EGFR: 51 mL/min/{1.73_m2} — ABNORMAL LOW (ref >90)
Glucose: 226 mg/dl — ABNORMAL HIGH (ref 70–140)
POTASSIUM: 4.7 meq/L (ref 3.5–5.1)
Sodium: 140 mEq/L (ref 136–145)
TOTAL PROTEIN: 6.4 g/dL (ref 6.4–8.3)

## 2015-04-10 LAB — HOLD TUBE, BLOOD BANK

## 2015-04-10 MED ORDER — DARBEPOETIN ALFA 300 MCG/0.6ML IJ SOSY
300.0000 ug | PREFILLED_SYRINGE | Freq: Once | INTRAMUSCULAR | Status: AC
  Administered 2015-04-10: 300 ug via SUBCUTANEOUS
  Filled 2015-04-10: qty 0.6

## 2015-04-10 NOTE — Telephone Encounter (Signed)
Pt confirmed labs/ov/inj per 05/13 POF, gave pt AVS and Calendar.... KJ

## 2015-04-11 NOTE — Progress Notes (Signed)
[place] OFFICE PROGRESS NOTE  KILPATRICK JR,[name] R, MD SUMMARY OF HEMATOLOGIC HISTORY:  She was found to have abnormal CBC from routine blood work. In 2012, she has normal hemoglobin. Since November 2012 until present time, she has recurrence of anemia requiring blood transfusion, with her blood counts as close to 6.2. She was placed on oral iron supplement for over a year. She also carries a diagnosis of autoimmune psoriatic arthritis and was placed on prednisone in the past. She denies recent chest pain on exertion, pre-syncopal episodes, or palpitations. She has mild weakness since the recent heart attack and shortness of breath on minimal exertion. Her last colonoscopy was several months ago in 2015 with virtual colonoscopy for positive fecal occult blood. Prior endoscopy in June 2011 revealed significant esophagitis. It was repeated again in April 2015 which was unremarkable.  The patient was prescribed oral iron supplements and she takes one tablet twice a day with breakfast and dinner. It is causing some abdominal bloating and constipation. In October 2015, she was given dose of intravenous iron Feraheme without improvement of anemia. On 10/28/2014, she was started on Darbopoeitin 200 g every 3 weeks. On 12/30/2014, dose of darbepoetin was increased to 300 g every 2 weeks. In February and March 2016, she received IV iron INTERVAL HISTORY: [name] 74 y.o. female returns for follow-up She feels well The patient denies any recent signs or symptoms of bleeding such as spontaneous epistaxis, hematuria or hematochezia. Denies chest pain or dyspnea  I have reviewed the past medical history, past surgical history, social history and family history with the patient and they are unchanged from previous note.  ALLERGIES:  is allergic to amlodipine besy-benazepril hcl; statins; tradjenta; plavix; and raptiva.  MEDICATIONS:  Current Outpatient Prescriptions   Medication Sig Dispense Refill  . albuterol (PROVENTIL HFA;VENTOLIN HFA) 108 (90 BASE) MCG/ACT inhaler Inhale 1 puff into the lungs every 6 (six) hours as needed. FOR SHORTNESS OF BREATH    . ALPRAZolam (XANAX) 0.5 MG tablet Take 0.25 mg by mouth 2 (two) times daily as needed for anxiety or sleep.    Marland Kitchen ammonium lactate (LAC-HYDRIN) 12 % lotion Apply 1 application topically daily as needed for dry skin (psorias).     Marland Kitchen aspirin 81 MG tablet Take 81 mg by mouth daily.    . carvedilol (COREG) 3.125 MG tablet Take 1 tablet (3.125 mg total) by mouth 2 (two) times daily with a meal. 180 tablet 3  . Cholecalciferol (VITAMIN D) 2000 UNITS CAPS Take 1 capsule by mouth every morning.    . Darbepoetin Alfa (ARANESP, ALBUMIN FREE, IJ) Inject 1 each as directed every 14 (fourteen) days.     . dorzolamide (TRUSOPT) 2 % ophthalmic solution Place 1 drop into the right eye 2 (two) times daily.    Marland Kitchen esomeprazole (NEXIUM) 40 MG capsule Take 40 mg by mouth daily before breakfast.    . feeding supplement, GLUCERNA SHAKE, (GLUCERNA SHAKE) LIQD Take 237 mLs by mouth daily at 3 pm.     . ferrous sulfate 325 (65 FE) MG tablet Take 1 tablet (325 mg total) by mouth 2 (two) times daily with a meal. 60 tablet 2  . fluticasone (FLONASE) 50 MCG/ACT nasal spray Place 1 spray into both nostrils at bedtime as needed for allergies.     . Fluticasone-Salmeterol (ADVAIR HFA IN) Inhale 2 puffs into the lungs every morning.     . furosemide (LASIX) 40 MG tablet Take 1 tablet (40 mg  total) by mouth daily. 30 tablet   . ipratropium-albuterol (DUONEB) 0.5-2.5 (3) MG/3ML SOLN Take 3 mLs by nebulization every 4 (four) hours as needed (shortness of breath).    . Liniments ([employer] ARTHRITIS PAIN RELIEVER EX) Apply 1 application topically 2 (two) times daily as needed (for shoulder pains, knee, back.).     Marland Kitchen Multiple Vitamins-Minerals (ICAPS) CAPS Take 1 capsule by mouth every morning.     . nystatin (MYCOSTATIN) 100000 UNIT/ML suspension Use as  directed 5 mLs in the mouth or throat every 8 (eight) hours as needed (sore mouth.).   1  . predniSONE (DELTASONE) 5 MG tablet Take 5 mg by mouth every Monday, Wednesday, and Friday.     . ranitidine (ZANTAC) 150 MG capsule Take 150 mg by mouth at bedtime.     . simethicone (MYLICON) 80 MG chewable tablet Chew 1 tablet (80 mg total) by mouth 4 (four) times daily as needed for flatulence. 30 tablet 0  . Travoprost, BAK Free, (TRAVATAMN) 0.004 % SOLN ophthalmic solution Place 1 drop into both eyes at bedtime.     Marland Kitchen glimepiride (AMARYL) 2 MG tablet Take 2 mg by mouth every morning.     . ticagrelor (BRILINTA) 90 MG TABS tablet Take 1 tablet (90 mg total) by mouth 2 (two) times daily. (Patient not taking: Reported on 04/10/2015) 180 tablet 3   No current facility-administered medications for this visit.     REVIEW OF SYSTEMS:   Constitutional: Denies fevers, chills or night sweats Eyes: Denies blurriness of vision Ears, nose, mouth, throat, and face: Denies mucositis or sore throat Respiratory: Denies cough, dyspnea or wheezes Cardiovascular: Denies palpitation, chest discomfort or lower extremity swelling Gastrointestinal:  Denies nausea, heartburn or change in bowel habits Skin: Denies abnormal skin rashes Lymphatics: Denies new lymphadenopathy or easy bruising Neurological:Denies numbness, tingling or new weaknesses Behavioral/Psych: Mood is stable, no new changes  All other systems were reviewed with the patient and are negative.  PHYSICAL EXAMINATION: ECOG PERFORMANCE STATUS: 0 - Asymptomatic  Filed Vitals:   04/10/15 1115  BP: 159/65  Pulse: 55  Temp: 98.1 F (36.7 C)  Resp: 17   Filed Weights   04/10/15 1115  Weight: 177 lb 14.4 oz (80.695 kg)    GENERAL:alert, no distress and comfortable SKIN: skin color, texture, turgor are normal, no rashes or significant lesions EYES: normal, Conjunctiva are pink and non-injected, sclera clear OROPHARYNX:no exudate, no erythema and  lips, buccal mucosa, and tongue normal  NECK: supple, thyroid normal size, non-tender, without nodularity LYMPH:  no palpable lymphadenopathy in the cervical, axillary or inguinal LUNGS: clear to auscultation and percussion with normal breathing effort HEART: regular rate & rhythm and no murmurs and no lower extremity edema ABDOMEN:abdomen soft, non-tender and normal bowel sounds Musculoskeletal:no cyanosis of digits and no clubbing  NEURO: alert & oriented x 3 with fluent speech, no focal motor/sensory deficits  LABORATORY DATA:  I have reviewed the data as listed Results for orders placed or performed in visit on 04/10/15 (from the past 48 hour(s))  Comprehensive metabolic panel     Status: Abnormal   Collection Time: 04/10/15 10:55 AM  Result Value Ref Range   Sodium 140 136 - 145 mEq/L   Potassium 4.7 3.5 - 5.1 mEq/L   Chloride 102 98 - 109 mEq/L   CO2 30 (H) 22 - 29 mEq/L   Glucose 226 (H) 70 - 140 mg/dl   BUN 18.7 7.0 - 26.0 mg/dL   Creatinine 1.2 (H) 0.6 -  1.1 mg/dL   Total Bilirubin 0.25 0.20 - 1.20 mg/dL   Alkaline Phosphatase 78 40 - 150 U/L   AST 17 5 - 34 U/L   ALT 15 0 - 55 U/L   Total Protein 6.4 6.4 - 8.3 g/dL   Albumin 3.4 (L) 3.5 - 5.0 g/dL   Calcium 9.3 8.4 - 10.4 mg/dL   Anion Gap 8 3 - 11 mEq/L   EGFR 51 (L) >90 ml/min/1.73 m2    Comment: eGFR is calculated using the CKD-EPI Creatinine Equation (2009)  Hold Tube, Blood Bank     Status: None   Collection Time: 04/10/15 10:56 AM  Result Value Ref Range   Hold Tube, Blood Bank Blood Bank Order Cancelled   CBC & Diff and Retic     Status: Abnormal   Collection Time: 04/10/15 10:56 AM  Result Value Ref Range   WBC 9.8 3.9 - 10.3 10e3/uL   NEUT# 7.6 (H) 1.5 - 6.5 10e3/uL   HGB 10.8 (L) 11.6 - 15.9 g/dL   HCT 36.2 34.8 - 46.6 %   Platelets 182 145 - 400 10e3/uL   MCV 93.3 79.5 - 101.0 fL   MCH 27.8 25.1 - 34.0 pg   MCHC 29.8 (L) 31.5 - 36.0 g/dL   RBC 3.88 3.70 - 5.45 10e6/uL   RDW 16.5 (H) 11.2 - 14.5 %    lymph# 1.2 0.9 - 3.3 10e3/uL   MONO# 0.6 0.1 - 0.9 10e3/uL   Eosinophils Absolute 0.4 0.0 - 0.5 10e3/uL   Basophils Absolute 0.0 0.0 - 0.1 10e3/uL   NEUT% 77.7 (H) 38.4 - 76.8 %   LYMPH% 12.2 (L) 14.0 - 49.7 %   MONO% 5.8 0.0 - 14.0 %   EOS% 4.0 0.0 - 7.0 %   BASO% 0.3 0.0 - 2.0 %   Retic % 5.33 (H) 0.70 - 2.10 %   Retic Ct Abs 206.80 (H) 33.70 - 90.70 10e3/uL   Immature Retic Fract 23.80 (H) 1.60 - 10.00 %    Lab Results  Component Value Date   WBC 9.8 04/10/2015   HGB 10.8* 04/10/2015   HCT 36.2 04/10/2015   MCV 93.3 04/10/2015   PLT 182 04/10/2015    ASSESSMENT & PLAN:  Deficiency anemia I believe the cause of her anemia is multifactorial, likely component of anemia chronic disease and iron deficiency. After intravenous iron infusion, she has improved. I recommend the use of erythropoeitin stimulating agents. We discussed the risks, benefits, side effects of erythropoietin stimulating agents for anemia, with the goal of keeping the hemoglobin greater than 11 g. She will continue oral iron supplement daily.   Essential hypertension Her blood pressure is suboptimally controlled I will watch that carefully while she is on ESA She will not get the injection if BP>160/90    All questions were answered. The patient knows to call the clinic with any problems, questions or concerns. No barriers to learning was detected.  I spent 15 minutes counseling the patient face to face. The total time spent in the appointment was 20 minutes and more than 50% was on counseling.     [facility], Riyansh Gerstner, MD 5/14/20169:01 PM

## 2015-04-11 NOTE — Assessment & Plan Note (Signed)
Her blood pressure is suboptimally controlled I will watch that carefully while she is on ESA She will not get the injection if BP>160/90

## 2015-04-11 NOTE — Assessment & Plan Note (Signed)
I believe the cause of her anemia is multifactorial, likely component of anemia chronic disease and iron deficiency. After intravenous iron infusion, she has improved. I recommend the use of erythropoeitin stimulating agents. We discussed the risks, benefits, side effects of erythropoietin stimulating agents for anemia, with the goal of keeping the hemoglobin greater than 11 g. She will continue oral iron supplement daily.

## 2015-04-13 NOTE — Anesthesia Preprocedure Evaluation (Addendum)
Anesthesia Evaluation  Patient identified by MRN, date of birth, ID band Patient awake    Reviewed: Allergy & Precautions, NPO status , Patient's Chart, lab work & pertinent test results  History of Anesthesia Complications Negative for: history of anesthetic complications  Airway Mallampati: II  TM Distance: >3 FB Neck ROM: Full    Dental no notable dental hx. (+) Dental Advisory Given   Pulmonary shortness of breath, asthma , COPD COPD inhaler, former smoker,  breath sounds clear to auscultation  Pulmonary exam normal       Cardiovascular hypertension, Pt. on medications and Pt. on home beta blockers + CAD, + Past MI, + Cardiac Stents, + Peripheral Vascular Disease and +CHF Normal cardiovascular exam+ dysrhythmias + Valvular Problems/Murmurs Rhythm:Regular Rate:Normal     Neuro/Psych  Headaches, PSYCHIATRIC DISORDERS Anxiety    GI/Hepatic Neg liver ROS, hiatal hernia, GERD-  Medicated and Controlled,  Endo/Other  diabetes  Renal/GU negative Renal ROS  negative genitourinary   Musculoskeletal  (+) Arthritis -, Hx of ankylosing spondylitis    Abdominal   Peds negative pediatric ROS (+)  Hematology  (+) anemia ,   Anesthesia Other Findings Pain with extension of neck but full extension able to be acheived  Reproductive/Obstetrics negative OB ROS                            Anesthesia Physical Anesthesia Plan  ASA: IV  Anesthesia Plan: MAC   Post-op Pain Management:    Induction: Intravenous  Airway Management Planned: Nasal Cannula  Additional Equipment:   Intra-op Plan:   Post-operative Plan:   Informed Consent: I have reviewed the patients History and Physical, chart, labs and discussed the procedure including the risks, benefits and alternatives for the proposed anesthesia with the patient or authorized representative who has indicated his/her understanding and acceptance.    Dental advisory given  Plan Discussed with: CRNA  Anesthesia Plan Comments:         Anesthesia Quick Evaluation

## 2015-04-14 ENCOUNTER — Ambulatory Visit (HOSPITAL_COMMUNITY)
Admission: RE | Admit: 2015-04-14 | Discharge: 2015-04-14 | Disposition: A | Discharge status: Home / Self Care | Payer: Medicare Other | Source: Ambulatory Visit | Attending: Internal Medicine | Admitting: Internal Medicine

## 2015-04-14 ENCOUNTER — Encounter (HOSPITAL_COMMUNITY): Payer: Self-pay | Admitting: Internal Medicine

## 2015-04-14 ENCOUNTER — Encounter (HOSPITAL_COMMUNITY)
Admission: RE | Disposition: A | Discharge status: Home / Self Care | Payer: Self-pay | Source: Ambulatory Visit | Attending: Internal Medicine

## 2015-04-14 ENCOUNTER — Ambulatory Visit (HOSPITAL_COMMUNITY): Payer: Medicare Other | Admitting: Anesthesiology

## 2015-04-14 DIAGNOSIS — I2510 Atherosclerotic heart disease of native coronary artery without angina pectoris: Secondary | ICD-10-CM | POA: Insufficient documentation

## 2015-04-14 DIAGNOSIS — D6489 Other specified anemias: Secondary | ICD-10-CM

## 2015-04-14 DIAGNOSIS — J45909 Unspecified asthma, uncomplicated: Secondary | ICD-10-CM | POA: Insufficient documentation

## 2015-04-14 DIAGNOSIS — I10 Essential (primary) hypertension: Secondary | ICD-10-CM | POA: Insufficient documentation

## 2015-04-14 DIAGNOSIS — K219 Gastro-esophageal reflux disease without esophagitis: Secondary | ICD-10-CM | POA: Insufficient documentation

## 2015-04-14 DIAGNOSIS — L409 Psoriasis, unspecified: Secondary | ICD-10-CM | POA: Diagnosis not present

## 2015-04-14 DIAGNOSIS — M109 Gout, unspecified: Secondary | ICD-10-CM | POA: Insufficient documentation

## 2015-04-14 DIAGNOSIS — I739 Peripheral vascular disease, unspecified: Secondary | ICD-10-CM | POA: Insufficient documentation

## 2015-04-14 DIAGNOSIS — Z90710 Acquired absence of both cervix and uterus: Secondary | ICD-10-CM | POA: Insufficient documentation

## 2015-04-14 DIAGNOSIS — K5521 Angiodysplasia of colon with hemorrhage: Secondary | ICD-10-CM | POA: Insufficient documentation

## 2015-04-14 DIAGNOSIS — Z888 Allergy status to other drugs, medicaments and biological substances status: Secondary | ICD-10-CM | POA: Insufficient documentation

## 2015-04-14 DIAGNOSIS — E119 Type 2 diabetes mellitus without complications: Secondary | ICD-10-CM | POA: Insufficient documentation

## 2015-04-14 DIAGNOSIS — D509 Iron deficiency anemia, unspecified: Secondary | ICD-10-CM | POA: Insufficient documentation

## 2015-04-14 DIAGNOSIS — K5520 Angiodysplasia of colon without hemorrhage: Secondary | ICD-10-CM | POA: Diagnosis not present

## 2015-04-14 DIAGNOSIS — I252 Old myocardial infarction: Secondary | ICD-10-CM | POA: Diagnosis not present

## 2015-04-14 DIAGNOSIS — M1990 Unspecified osteoarthritis, unspecified site: Secondary | ICD-10-CM | POA: Insufficient documentation

## 2015-04-14 DIAGNOSIS — Z9842 Cataract extraction status, left eye: Secondary | ICD-10-CM | POA: Diagnosis not present

## 2015-04-14 DIAGNOSIS — Z9841 Cataract extraction status, right eye: Secondary | ICD-10-CM | POA: Insufficient documentation

## 2015-04-14 DIAGNOSIS — I509 Heart failure, unspecified: Secondary | ICD-10-CM | POA: Insufficient documentation

## 2015-04-14 DIAGNOSIS — J449 Chronic obstructive pulmonary disease, unspecified: Secondary | ICD-10-CM | POA: Diagnosis not present

## 2015-04-14 DIAGNOSIS — F419 Anxiety disorder, unspecified: Secondary | ICD-10-CM | POA: Diagnosis not present

## 2015-04-14 DIAGNOSIS — Z961 Presence of intraocular lens: Secondary | ICD-10-CM | POA: Insufficient documentation

## 2015-04-14 DIAGNOSIS — Z955 Presence of coronary angioplasty implant and graft: Secondary | ICD-10-CM | POA: Insufficient documentation

## 2015-04-14 DIAGNOSIS — Z87891 Personal history of nicotine dependence: Secondary | ICD-10-CM | POA: Diagnosis not present

## 2015-04-14 DIAGNOSIS — G8929 Other chronic pain: Secondary | ICD-10-CM | POA: Insufficient documentation

## 2015-04-14 DIAGNOSIS — K31811 Angiodysplasia of stomach and duodenum with bleeding: Secondary | ICD-10-CM | POA: Diagnosis not present

## 2015-04-14 DIAGNOSIS — E780 Pure hypercholesterolemia: Secondary | ICD-10-CM | POA: Diagnosis not present

## 2015-04-14 DIAGNOSIS — M549 Dorsalgia, unspecified: Secondary | ICD-10-CM | POA: Diagnosis not present

## 2015-04-14 HISTORY — PX: ENTEROSCOPY: SHX5533

## 2015-04-14 LAB — GLUCOSE, CAPILLARY: GLUCOSE-CAPILLARY: 155 mg/dL — AB (ref 65–99)

## 2015-04-14 SURGERY — ENTEROSCOPY
Anesthesia: Monitor Anesthesia Care

## 2015-04-14 MED ORDER — SODIUM CHLORIDE 0.9 % IV SOLN
INTRAVENOUS | Status: DC
Start: 2015-04-14 — End: 2015-04-14

## 2015-04-14 MED ORDER — PROPOFOL 10 MG/ML IV BOLUS
INTRAVENOUS | Status: DC | PRN
  Administered 2015-04-14: 60 mg via INTRAVENOUS
  Administered 2015-04-14: 40 mg via INTRAVENOUS
  Administered 2015-04-14: 20 mg via INTRAVENOUS
  Administered 2015-04-14: 40 mg via INTRAVENOUS
  Administered 2015-04-14: 60 mg via INTRAVENOUS

## 2015-04-14 MED ORDER — LACTATED RINGERS IV SOLN
INTRAVENOUS | Status: DC | PRN
  Administered 2015-04-14: 10:00:00 via INTRAVENOUS

## 2015-04-14 MED ORDER — PROPOFOL 10 MG/ML IV BOLUS
INTRAVENOUS | Status: AC
  Filled 2015-04-14: qty 20

## 2015-04-14 MED ORDER — BUTAMBEN-TETRACAINE-BENZOCAINE 2-2-14 % EX AERO
INHALATION_SPRAY | CUTANEOUS | Status: DC | PRN
Start: 2015-04-14 — End: 2015-04-14
  Administered 2015-04-14: 1 via TOPICAL

## 2015-04-14 NOTE — Anesthesia Postprocedure Evaluation (Signed)
  Anesthesia Post-op Note  Patient: [name]  Procedure(s) Performed: Procedure(s) (LRB): ESOPHAGOGASTRODUODENOSCOPY (EGD) (N/A) ENTEROSCOPY (N/A)  Patient Location: PACU  Anesthesia Type: MAC  Level of Consciousness: awake and alert   Airway and Oxygen Therapy: Patient Spontanous Breathing  Post-op Pain: mild  Post-op Assessment: Post-op Vital signs reviewed, Patient's Cardiovascular Status Stable, Respiratory Function Stable, Patent Airway and No signs of Nausea or vomiting  Last Vitals:  Filed Vitals:   04/14/15 1127  BP: 149/65  Pulse: 60  Temp:   Resp: 16    Post-op Vital Signs: stable   Complications: No apparent anesthesia complications

## 2015-04-14 NOTE — Transfer of Care (Signed)
Immediate Anesthesia Transfer of Care Note  Patient: [name]  Procedure(s) Performed: Procedure(s): ESOPHAGOGASTRODUODENOSCOPY (EGD) (N/A) ENTEROSCOPY (N/A)  Patient Location: PACU and Endoscopy Unit  Anesthesia Type:MAC  Level of Consciousness: awake, lethargic and responds to stimulation  Airway & Oxygen Therapy: Patient Spontanous Breathing and Patient connected to nasal cannula oxygen  Post-op Assessment: Report given to RN, Post -op Vital signs reviewed and stable and Patient moving all extremities  Post vital signs: Reviewed and stable  Last Vitals:  Filed Vitals:   04/14/15 0930  BP: 141/60  Temp: 36.9 C  Resp: 16    Complications: No apparent anesthesia complications

## 2015-04-14 NOTE — H&P (Signed)
HISTORY OF PRESENT ILLNESS:  [name] is a 74 y.o. female being evaluated for anemia and heme positive stool. 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 Recent capsule endoscopy with bleeding in the proximal small bowel. Question underlying vascular lesion versus nonspecific oozing on Brilinta. Has been off Brilinta per cardiology for weeks. Now for enteroscopy with possible hemostatic therapy   REVIEW OF SYSTEMS:  All non-GI ROS negative currently  Past Medical History  Diagnosis Date  . COPD (chronic obstructive pulmonary disease)   . Hypertension   . Asthma   . Psoriatic arthritis   . Psoriasis   . GERD (gastroesophageal reflux disease)   . Hiatal hernia   . Chronic sinus infection   . Heart murmur   . Blood transfusion ~ 1962    "11 units of blood"  . Ankylosing spondylitis   . Chronic back pain   . Dysrhythmia     "palpitations"  . Esophageal dilatation   . Candida esophagitis   . Cirrhosis of liver without mention of alcohol   . Hypercholesteremia   . Peripheral vascular disease   . Exertional dyspnea   . Pneumonia 10/07/11; 2006; 1980's  . Chronic bronchitis     "since childhood" (12/06/2012)  . Type II diabetes mellitus   . Iron deficiency anemia   . External bleeding hemorrhoids   . Sinus headache   . Arthritis     "psorasic"  . OA (osteoarthritis)   . DJD (degenerative joint disease) of knee     "both" (12/06/2012)  . Carpal tunnel syndrome on right   . Anxiety   . Barrett esophagus   . Non-healing ulcer of foot, secondary to diabetes and PVD 12/07/2012  . PVD (peripheral vascular disease) 12/07/2012  . Tobacco abuse 12/07/2012  . Critical lower limb ischemia, with ulcer Lt foot 12/07/2012  . Gout   . COPD (chronic obstructive pulmonary disease)   . Carotid artery disease     dopplers showed mod L ICA stenosis   . PAD (peripheral artery disease)   . History of nuclear stress test 11/2012    lexiscan; normal, low risk   .  Dermatophytosis of nail [id number]  . Psoriasis   . GI bleed   . Diverticulosis   . Pericardial effusion     moderate by 2-D echo without evidence of An odd    Past Surgical History  Procedure Laterality Date  . Nasal sinus surgery  09/29/2009  . Carpal tunnel release  2011    left hand  . Lipoma excision  2010    "left occipital area"  . Cataract extraction w/ intraocular lens implant  2009/2010    right/left  . Eye surgery    . Oophorectomy      'not sure which one"  . Appendectomy    . Angioplasty  12/10/2012    "LLE"; LSFA Diamondback orbital rotational arthrectomy, PTA , stenting with IDEV stent (left ABI improved from 0.35 to 0.75)  . Abdominal hysterectomy  1976  . Transmetatarsal amputation Left 01/08/2013    Procedure: TRANSMETATARSAL AMPUTATION;  Surgeon: Angelia Mould, MD;  Location: [facility];  Service: Vascular;  Laterality: Left;  . Esophagogastroduodenoscopy  2011  . Colonoscopy  2007    Hemorrhoids and Diverticulosis  . Esophagogastroduodenoscopy N/A 03/17/2014    Procedure: ESOPHAGOGASTRODUODENOSCOPY (EGD);  Surgeon: Irene Shipper, MD;  Location: [facility] ENDOSCOPY;  Service: Endoscopy;  Laterality: N/A;  . Lower extremity angiogram N/A 12/06/2012    Procedure: LOWER EXTREMITY  ANGIOGRAM;  Surgeon: Lorretta Harp, MD;  Location: [facility] CATH LAB;  Service: Cardiovascular;  Laterality: N/A;  . Atherectomy N/A 12/10/2012    Procedure: ATHERECTOMY;  Surgeon: Lorretta Harp, MD;  Location: [facility] CATH LAB;  Service: Cardiovascular;  Laterality: N/A;  . Left heart catheterization with coronary angiogram N/A 04/09/2014    Procedure: LEFT HEART CATHETERIZATION WITH CORONARY ANGIOGRAM;  Surgeon: Sinclair Grooms, MD;  Location: [facility] CATH LAB;  Service: Cardiovascular;  Laterality: N/A;  . Percutaneous coronary rotoblator intervention (pci-r) N/A 04/16/2014    Procedure: PERCUTANEOUS CORONARY ROTOBLATOR INTERVENTION (PCI-R);  Surgeon: Blane Ohara, MD;  Location: [facility] CATH LAB;  Service:  Cardiovascular;  Laterality: N/A;  . Coronary angioplasty      x2    Social History Sheelah C Fluhr  reports that she has quit smoking. Her smoking use included Cigarettes. She has a 40 pack-year smoking history. She quit smokeless tobacco use about 18 months ago. She reports that she does not drink alcohol or use illicit drugs.  family history includes Arthritis in her brother and sister; Cancer in her mother and paternal grandfather; Diabetes in her brother; Heart Problems in her maternal grandmother; Lung cancer in her sister; Lupus in her sister; Thyroid disease in her sister. There is no history of Colon cancer.  Allergies  Allergen Reactions  . Amlodipine Besy-Benazepril Hcl Other (See Comments)    Nervous/shakiness  . Statins Other (See Comments)    "can't take any of them; cramp me up" (12/06/2012)  . Tradjenta [Linagliptin] Other (See Comments)    Extreme muscle pains, chest and back pains  . Plavix [Clopidogrel] Rash    Severe burning of the skin.Pt states that the doctor told her that her rash was psoriasis and not related to plavix but she states the rash came from the plavix  . Raptiva [Efalizumab] Rash       PHYSICAL EXAMINATION: Vital signs: BP 141/60 mmHg  Temp(Src) 98.4 F (36.9 C) (Oral)  Resp 16  SpO2 98% General: Well-developed, well-nourished, no acute distress HEENT: Sclerae are anicteric, conjunctiva pink. Oral mucosa intact Lungs: Clear Heart: Regular Abdomen: soft, nontender, nondistended, no obvious ascites, no peritoneal signs, normal bowel sounds. No organomegaly. Extremities: No edema Psychiatric: alert and oriented x3. Cooperative     ASSESSMENT:  #1. Multifactorial anemia. #2. GI bleeding secondary to AVM versus oozing on Brilinta  #3. Multiple medical problems   PLAN:  #1. Upper endoscopy/enteroscopy with possible hemostatic therapy. High-risk patient.The nature of the procedure, as well as the risks, benefits, and alternatives were  carefully and thoroughly reviewed with the patient. Ample time for discussion and questions allowed. The patient understood, was satisfied, and agreed to proceed.

## 2015-04-14 NOTE — Op Note (Signed)
[facility] [place], [address]   ENTEROSCOPY PROCEDURE REPORT     EXAM DATE: 04/14/2015  PATIENT NAME:      [name], [name]           MR #:      [id number]  BIRTHDATE:       September 20, 1941      VISIT #:     [phone]  ATTENDING:     Eustace Quail, MD     STATUS:     outpatient ASSISTANT:      Sharon Mt and Ronny Flurry MD: Heath Lark, M.D. ASA CLASS:        Class III  INDICATIONS:  The patient is a 74 yr old female here for an enteroscopy procedure due to iron deficiency anemia and upper G.I. bleeding. Proximal bleeding lesions in small bowel on capsule endoscopy while on Brilinta. Now off Brilinta PROCEDURE PERFORMED:     Small bowel enteroscopy with control of bleeding  MEDICATIONS:     Monitored anesthesia care and Per Anesthesia  CONSENT: The patient understands the risks and benefits of the procedure and understands that these risks include, but are not limited to: sedation, allergic reaction, infection, perforation and/or bleeding. Alternative means of evaluation and treatment include, among others: physical exam, x-rays, and/or surgical intervention. The patient elects to proceed with this endoscopic procedure.  DESCRIPTION OF PROCEDURE: During intra-op preparation period all mechanical & medical equipment was checked for proper function. Hand hygiene and appropriate measures for infection prevention was taken. After the risks, benefits and alternatives of the procedure were thoroughly explained, Informed consent was verified, confirmed and timeout was successfully executed by the treatment team. The [id number] endoscope was introduced through the mouth and advanced to the proximal jejunum jejunum. The prep was The overall prep quality was excellent.. The instrument was then slowly withdrawn while examining the mucosa circumferentially. The scope was then completely withdrawn from the patient and  the procedure terminated. The pulse, BP, and O2 saturation were monitored and documented by the physician and the nursing staff throughout the entire procedure.  The patient was cared for as planned according to standard protocol, then discharged to recovery in stable condition and with appropriate post procedure care. Estimated blood loss is zero unless otherwise noted in this procedure report. EXAM:The esophagus was normal.  The stomach was normal.  The duodenal bulb revealed a nonbleeding AVM.  Post bulbar duodenum and proximal jejunum revealed multiple AVMs.  They range between 1 mm and 4 mm.  No active bleeding.  All visualized AVMs (6) were ablated with APC.    ADVERSE EVENTS:      There were no immediate complications.  IMPRESSIONS:     1. 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 Otherwise normal exam 3. Multifactorial anemia. Hopefully, the combination of endoscopic therapy, chronic iron, and discontinuation of Brilinta allow for stable blood counts.  RECOMMENDATIONS:     1. Continue iron therapy and Aranesp 2. Continue to have your blood counts monitored by Dr. Alvy Bimler 3. GI follow-up as needed RECALL:  _____________________________ Eustace Quail, MD eSigned:  Eustace Quail, MD 04/14/2015 10:58 AM   cc:  The Patient , Vincente Liberty MD , Ni Gorsuch,MD     PATIENT NAME:  Shelby, Harding MR#: [id number]

## 2015-04-14 NOTE — Discharge Instructions (Signed)
Esophagogastroduodenoscopy °Care After °Refer to this sheet in the next few weeks. These instructions provide you with information on caring for yourself after your procedure. Your caregiver may also give you more specific instructions. Your treatment has been planned according to current medical practices, but problems sometimes occur. Call your caregiver if you have any problems or questions after your procedure.  °HOME CARE INSTRUCTIONS °· Do not eat or drink anything until the numbing medicine (local anesthetic) has worn off and your gag reflex has returned. You will know that the local anesthetic has worn off when you can swallow comfortably. °· Do not drive for 12 hours after the procedure or as directed by your caregiver. °· Only take medicines as directed by your caregiver. °SEEK MEDICAL CARE IF:  °· You cannot stop coughing. °· You are not urinating at all or less than usual. °SEEK IMMEDIATE MEDICAL CARE IF: °· You have difficulty swallowing. °· You cannot eat or drink. °· You have worsening throat or chest pain. °· You have dizziness, lightheadedness, or you faint. °· You have nausea or vomiting. °· You have chills. °· You have a fever. °· You have severe abdominal pain. °· You have black, tarry, or bloody stools. °Document Released: 10/31/2012 Document Reviewed: 10/31/2012 °ExitCare® Patient Information ©2015 [employer]. This information is not intended to replace advice given to you by your health care provider. Make sure you discuss any questions you have with your health care provider. ° °

## 2015-04-16 ENCOUNTER — Encounter (HOSPITAL_COMMUNITY): Payer: Self-pay | Admitting: Internal Medicine

## 2015-04-21 ENCOUNTER — Ambulatory Visit: Payer: Medicare Other | Admitting: Cardiology

## 2015-04-24 ENCOUNTER — Ambulatory Visit (HOSPITAL_BASED_OUTPATIENT_CLINIC_OR_DEPARTMENT_OTHER): Payer: Medicare Other

## 2015-04-24 ENCOUNTER — Other Ambulatory Visit (HOSPITAL_BASED_OUTPATIENT_CLINIC_OR_DEPARTMENT_OTHER): Payer: Medicare Other

## 2015-04-24 VITALS — BP 151/54 | HR 67 | Temp 98.6°F

## 2015-04-24 DIAGNOSIS — D638 Anemia in other chronic diseases classified elsewhere: Secondary | ICD-10-CM

## 2015-04-24 DIAGNOSIS — D649 Anemia, unspecified: Secondary | ICD-10-CM

## 2015-04-24 DIAGNOSIS — D539 Nutritional anemia, unspecified: Secondary | ICD-10-CM

## 2015-04-24 DIAGNOSIS — D509 Iron deficiency anemia, unspecified: Secondary | ICD-10-CM

## 2015-04-24 LAB — CBC & DIFF AND RETIC
BASO%: 0.5 % (ref 0.0–2.0)
Basophils Absolute: 0 10*3/uL (ref 0.0–0.1)
EOS ABS: 0.3 10*3/uL (ref 0.0–0.5)
EOS%: 4.2 % (ref 0.0–7.0)
HCT: 36.2 % (ref 34.8–46.6)
HEMOGLOBIN: 10.8 g/dL — AB (ref 11.6–15.9)
IMMATURE RETIC FRACT: 17.1 % — AB (ref 1.60–10.00)
LYMPH%: 19.9 % (ref 14.0–49.7)
MCH: 27.6 pg (ref 25.1–34.0)
MCHC: 29.8 g/dL — AB (ref 31.5–36.0)
MCV: 92.6 fL (ref 79.5–101.0)
MONO#: 0.7 10*3/uL (ref 0.1–0.9)
MONO%: 9.7 % (ref 0.0–14.0)
NEUT#: 5 10*3/uL (ref 1.5–6.5)
NEUT%: 65.7 % (ref 38.4–76.8)
Platelets: 170 10*3/uL (ref 145–400)
RBC: 3.91 10*6/uL (ref 3.70–5.45)
RDW: 16.3 % — ABNORMAL HIGH (ref 11.2–14.5)
Retic %: 4.48 % — ABNORMAL HIGH (ref 0.70–2.10)
Retic Ct Abs: 175.17 10*3/uL — ABNORMAL HIGH (ref 33.70–90.70)
WBC: 7.5 10*3/uL (ref 3.9–10.3)
lymph#: 1.5 10*3/uL (ref 0.9–3.3)

## 2015-04-24 MED ORDER — DARBEPOETIN ALFA 300 MCG/0.6ML IJ SOSY
300.0000 ug | PREFILLED_SYRINGE | Freq: Once | INTRAMUSCULAR | Status: AC
  Administered 2015-04-24: 300 ug via SUBCUTANEOUS
  Filled 2015-04-24: qty 0.6

## 2015-04-24 NOTE — Patient Instructions (Signed)
Darbepoetin Alfa injection What is this medicine? DARBEPOETIN ALFA (dar be POE e tin AL fa) helps your body make more red blood cells. It is used to treat anemia caused by chronic kidney failure and chemotherapy. This medicine may be used for other purposes; ask your health care provider or pharmacist if you have questions. COMMON BRAND NAME(S): Aranesp What should I tell my health care provider before I take this medicine? They need to know if you have any of these conditions: -blood clotting disorders or history of blood clots -cancer patient not on chemotherapy -cystic fibrosis -heart disease, such as angina, heart failure, or a history of a heart attack -hemoglobin level of 12 g/dL or greater -high blood pressure -low levels of folate, iron, or vitamin B12 -seizures -an unusual or allergic reaction to darbepoetin, erythropoietin, albumin, hamster proteins, latex, other medicines, foods, dyes, or preservatives -pregnant or trying to get pregnant -breast-feeding How should I use this medicine? This medicine is for injection into a vein or under the skin. It is usually given by a health care professional in a hospital or clinic setting. If you get this medicine at home, you will be taught how to prepare and give this medicine. Do not shake the solution before you withdraw a dose. Use exactly as directed. Take your medicine at regular intervals. Do not take your medicine more often than directed. It is important that you put your used needles and syringes in a special sharps container. Do not put them in a trash can. If you do not have a sharps container, call your pharmacist or healthcare provider to get one. Talk to your pediatrician regarding the use of this medicine in children. While this medicine may be used in children as young as 1 year for selected conditions, precautions do apply. Overdosage: If you think you have taken too much of this medicine contact a poison control center or  emergency room at once. NOTE: This medicine is only for you. Do not share this medicine with others. What if I miss a dose? If you miss a dose, take it as soon as you can. If it is almost time for your next dose, take only that dose. Do not take double or extra doses. What may interact with this medicine? Do not take this medicine with any of the following medications: -epoetin alfa This list may not describe all possible interactions. Give your health care provider a list of all the medicines, herbs, non-prescription drugs, or dietary supplements you use. Also tell them if you smoke, drink alcohol, or use illegal drugs. Some items may interact with your medicine. What should I watch for while using this medicine? Visit your prescriber or health care professional for regular checks on your progress and for the needed blood tests and blood pressure measurements. It is especially important for the doctor to make sure your hemoglobin level is in the desired range, to limit the risk of potential side effects and to give you the best benefit. Keep all appointments for any recommended tests. Check your blood pressure as directed. Ask your doctor what your blood pressure should be and when you should contact him or her. As your body makes more red blood cells, you may need to take iron, folic acid, or vitamin B supplements. Ask your doctor or health care provider which products are right for you. If you have kidney disease continue dietary restrictions, even though this medication can make you feel better. Talk with your doctor or health   care professional about the foods you eat and the vitamins that you take. What side effects may I notice from receiving this medicine? Side effects that you should report to your doctor or health care professional as soon as possible: -allergic reactions like skin rash, itching or hives, swelling of the face, lips, or tongue -breathing problems -changes in vision -chest  pain -confusion, trouble speaking or understanding -feeling faint or lightheaded, falls -high blood pressure -muscle aches or pains -pain, swelling, warmth in the leg -rapid weight gain -severe headaches -sudden numbness or weakness of the face, arm or leg -trouble walking, dizziness, loss of balance or coordination -seizures (convulsions) -swelling of the ankles, feet, hands -unusually weak or tired Side effects that usually do not require medical attention (report to your doctor or health care professional if they continue or are bothersome): -diarrhea -fever, chills (flu-like symptoms) -headaches -nausea, vomiting -redness, stinging, or swelling at site where injected This list may not describe all possible side effects. Call your doctor for medical advice about side effects. You may report side effects to FDA at 1-800-FDA-1088. Where should I keep my medicine? Keep out of the reach of children. Store in a refrigerator between 2 and 8 degrees C (36 and 46 degrees F). Do not freeze. Do not shake. Throw away any unused portion if using a single-dose vial. Throw away any unused medicine after the expiration date. NOTE: This sheet is a summary. It may not cover all possible information. If you have questions about this medicine, talk to your doctor, pharmacist, or health care provider.  2015, Elsevier/[employer] Standard. (2008-10-28 10:23:57)  

## 2015-05-08 ENCOUNTER — Other Ambulatory Visit (HOSPITAL_BASED_OUTPATIENT_CLINIC_OR_DEPARTMENT_OTHER): Payer: Medicare Other

## 2015-05-08 ENCOUNTER — Ambulatory Visit: Payer: Medicare Other

## 2015-05-08 DIAGNOSIS — D649 Anemia, unspecified: Secondary | ICD-10-CM

## 2015-05-08 DIAGNOSIS — D539 Nutritional anemia, unspecified: Secondary | ICD-10-CM

## 2015-05-08 DIAGNOSIS — D509 Iron deficiency anemia, unspecified: Secondary | ICD-10-CM

## 2015-05-08 LAB — CBC & DIFF AND RETIC
BASO%: 0.3 % (ref 0.0–2.0)
Basophils Absolute: 0 10*3/uL (ref 0.0–0.1)
EOS%: 3.1 % (ref 0.0–7.0)
Eosinophils Absolute: 0.3 10*3/uL (ref 0.0–0.5)
HCT: 38.7 % (ref 34.8–46.6)
HGB: 11.8 g/dL (ref 11.6–15.9)
Immature Retic Fract: 9.3 % (ref 1.60–10.00)
LYMPH#: 1.4 10*3/uL (ref 0.9–3.3)
LYMPH%: 18.1 % (ref 14.0–49.7)
MCH: 27.6 pg (ref 25.1–34.0)
MCHC: 30.5 g/dL — ABNORMAL LOW (ref 31.5–36.0)
MCV: 90.4 fL (ref 79.5–101.0)
MONO#: 1 10*3/uL — ABNORMAL HIGH (ref 0.1–0.9)
MONO%: 11.9 % (ref 0.0–14.0)
NEUT%: 66.6 % (ref 38.4–76.8)
NEUTROS ABS: 5.3 10*3/uL (ref 1.5–6.5)
Platelets: 204 10*3/uL (ref 145–400)
RBC: 4.28 10*6/uL (ref 3.70–5.45)
RDW: 16.1 % — ABNORMAL HIGH (ref 11.2–14.5)
RETIC %: 3.47 % — AB (ref 0.70–2.10)
RETIC CT ABS: 148.52 10*3/uL — AB (ref 33.70–90.70)
WBC: 8 10*3/uL (ref 3.9–10.3)

## 2015-05-08 LAB — COMPREHENSIVE METABOLIC PANEL (CC13)
ALT: 13 U/L (ref 0–55)
AST: 15 U/L (ref 5–34)
Albumin: 3.5 g/dL (ref 3.5–5.0)
Alkaline Phosphatase: 77 U/L (ref 40–150)
Anion Gap: 10 mEq/L (ref 3–11)
BILIRUBIN TOTAL: 0.29 mg/dL (ref 0.20–1.20)
BUN: 21.3 mg/dL (ref 7.0–26.0)
CO2: 26 mEq/L (ref 22–29)
Calcium: 9.6 mg/dL (ref 8.4–10.4)
Chloride: 102 mEq/L (ref 98–109)
Creatinine: 1.2 mg/dL — ABNORMAL HIGH (ref 0.6–1.1)
EGFR: 53 mL/min/{1.73_m2} — ABNORMAL LOW (ref >90)
Glucose: 211 mg/dl — ABNORMAL HIGH (ref 70–140)
POTASSIUM: 4.2 meq/L (ref 3.5–5.1)
Sodium: 138 mEq/L (ref 136–145)
Total Protein: 6.9 g/dL (ref 6.4–8.3)

## 2015-05-08 MED ORDER — DARBEPOETIN ALFA 300 MCG/0.6ML IJ SOSY: 300.0000 ug | PREFILLED_SYRINGE | Freq: Once | INTRAMUSCULAR | Status: DC

## 2015-05-13 ENCOUNTER — Ambulatory Visit (HOSPITAL_COMMUNITY)
Admission: RE | Admit: 2015-05-13 | Discharge: 2015-05-13 | Disposition: A | Discharge status: Home / Self Care | Payer: Medicare Other | Source: Ambulatory Visit | Attending: Cardiovascular Disease | Admitting: Cardiovascular Disease

## 2015-05-13 ENCOUNTER — Ambulatory Visit (HOSPITAL_COMMUNITY): Payer: Medicare Other

## 2015-05-13 DIAGNOSIS — I319 Disease of pericardium, unspecified: Secondary | ICD-10-CM | POA: Diagnosis not present

## 2015-05-13 DIAGNOSIS — I358 Other nonrheumatic aortic valve disorders: Secondary | ICD-10-CM | POA: Diagnosis not present

## 2015-05-13 DIAGNOSIS — I517 Cardiomegaly: Secondary | ICD-10-CM | POA: Diagnosis not present

## 2015-05-13 DIAGNOSIS — I371 Nonrheumatic pulmonary valve insufficiency: Secondary | ICD-10-CM | POA: Diagnosis not present

## 2015-05-13 DIAGNOSIS — I313 Pericardial effusion (noninflammatory): Secondary | ICD-10-CM | POA: Diagnosis present

## 2015-05-13 DIAGNOSIS — I3139 Other pericardial effusion (noninflammatory): Secondary | ICD-10-CM

## 2015-05-22 ENCOUNTER — Ambulatory Visit: Payer: Medicare Other

## 2015-05-22 ENCOUNTER — Other Ambulatory Visit (HOSPITAL_BASED_OUTPATIENT_CLINIC_OR_DEPARTMENT_OTHER): Payer: Medicare Other

## 2015-05-22 DIAGNOSIS — D649 Anemia, unspecified: Secondary | ICD-10-CM

## 2015-05-22 DIAGNOSIS — D539 Nutritional anemia, unspecified: Secondary | ICD-10-CM

## 2015-05-22 DIAGNOSIS — D509 Iron deficiency anemia, unspecified: Secondary | ICD-10-CM

## 2015-05-22 DIAGNOSIS — D638 Anemia in other chronic diseases classified elsewhere: Secondary | ICD-10-CM

## 2015-05-22 LAB — COMPREHENSIVE METABOLIC PANEL (CC13)
ALK PHOS: 72 U/L (ref 40–150)
ALT: 20 U/L (ref 0–55)
AST: 18 U/L (ref 5–34)
Albumin: 3.3 g/dL — ABNORMAL LOW (ref 3.5–5.0)
Anion Gap: 7 mEq/L (ref 3–11)
BUN: 23.9 mg/dL (ref 7.0–26.0)
CO2: 29 mEq/L (ref 22–29)
CREATININE: 1.2 mg/dL — AB (ref 0.6–1.1)
Calcium: 9.8 mg/dL (ref 8.4–10.4)
Chloride: 102 mEq/L (ref 98–109)
EGFR: 52 mL/min/{1.73_m2} — AB (ref >90)
Glucose: 215 mg/dl — ABNORMAL HIGH (ref 70–140)
Potassium: 4 mEq/L (ref 3.5–5.1)
Sodium: 139 mEq/L (ref 136–145)
Total Bilirubin: 0.23 mg/dL (ref 0.20–1.20)
Total Protein: 6.6 g/dL (ref 6.4–8.3)

## 2015-05-22 LAB — CBC & DIFF AND RETIC
BASO%: 0.4 % (ref 0.0–2.0)
BASOS ABS: 0 10*3/uL (ref 0.0–0.1)
EOS%: 3.5 % (ref 0.0–7.0)
Eosinophils Absolute: 0.3 10*3/uL (ref 0.0–0.5)
HEMATOCRIT: 36.7 % (ref 34.8–46.6)
HEMOGLOBIN: 11.4 g/dL — AB (ref 11.6–15.9)
IMMATURE RETIC FRACT: 7.4 % (ref 1.60–10.00)
LYMPH#: 1.6 10*3/uL (ref 0.9–3.3)
LYMPH%: 19.3 % (ref 14.0–49.7)
MCH: 27.6 pg (ref 25.1–34.0)
MCHC: 31.1 g/dL — AB (ref 31.5–36.0)
MCV: 88.9 fL (ref 79.5–101.0)
MONO#: 0.8 10*3/uL (ref 0.1–0.9)
MONO%: 10.1 % (ref 0.0–14.0)
NEUT#: 5.5 10*3/uL (ref 1.5–6.5)
NEUT%: 66.7 % (ref 38.4–76.8)
Platelets: 155 10*3/uL (ref 145–400)
RBC: 4.13 10*6/uL (ref 3.70–5.45)
RDW: 15.8 % — AB (ref 11.2–14.5)
RETIC CT ABS: 81.36 10*3/uL (ref 33.70–90.70)
Retic %: 1.97 % (ref 0.70–2.10)
WBC: 8.2 10*3/uL (ref 3.9–10.3)

## 2015-05-22 MED ORDER — DARBEPOETIN ALFA 300 MCG/0.6ML IJ SOSY: 300.0000 ug | PREFILLED_SYRINGE | Freq: Once | INTRAMUSCULAR | Status: DC

## 2015-05-25 ENCOUNTER — Other Ambulatory Visit: Payer: Self-pay

## 2015-05-27 ENCOUNTER — Ambulatory Visit (INDEPENDENT_AMBULATORY_CARE_PROVIDER_SITE_OTHER): Payer: Medicare Other | Admitting: Cardiovascular Disease

## 2015-05-27 ENCOUNTER — Encounter: Payer: Self-pay | Admitting: Cardiovascular Disease

## 2015-05-27 VITALS — BP 136/74 | HR 64 | Ht 64.0 in | Wt 176.1 lb

## 2015-05-27 DIAGNOSIS — I2510 Atherosclerotic heart disease of native coronary artery without angina pectoris: Secondary | ICD-10-CM

## 2015-05-27 DIAGNOSIS — I319 Disease of pericardium, unspecified: Secondary | ICD-10-CM

## 2015-05-27 DIAGNOSIS — I3139 Other pericardial effusion (noninflammatory): Secondary | ICD-10-CM

## 2015-05-27 DIAGNOSIS — I313 Pericardial effusion (noninflammatory): Secondary | ICD-10-CM

## 2015-05-27 DIAGNOSIS — I739 Peripheral vascular disease, unspecified: Secondary | ICD-10-CM

## 2015-05-27 DIAGNOSIS — I779 Disorder of arteries and arterioles, unspecified: Secondary | ICD-10-CM

## 2015-05-27 DIAGNOSIS — I2583 Coronary atherosclerosis due to lipid rich plaque: Secondary | ICD-10-CM

## 2015-05-27 DIAGNOSIS — I10 Essential (primary) hypertension: Secondary | ICD-10-CM

## 2015-05-27 DIAGNOSIS — Z79899 Other long term (current) drug therapy: Secondary | ICD-10-CM

## 2015-05-27 DIAGNOSIS — E785 Hyperlipidemia, unspecified: Secondary | ICD-10-CM

## 2015-05-27 NOTE — Assessment & Plan Note (Signed)
History of CAD status post hemodynamically supported left main and LAD high-speed rotational atherectomy, PCI and stenting using Alpine drug-eluting stent performed radially by Dr. Burt Knack 04/16/14. She had an Impella device placed in her left common femoral artery which was removed postprocedure. Her EF was 35% prior to intervention. She recovered nicely with improvement in her ejection fraction.her most recent ejection fraction by 2-D echo was in the 45-50% range. She denies chest pain or shortness of breath. Her Brilenta was discontinued approximately 6 weeks ago because of ongoing GI bleeding. She had endoscopy revealing several duodenal bleeding lesions. She underwent laser therapy by Dr. Henrene Pastor. At this point, given her bleeding issues and the fact that she is over a year post intervention I feel comfortable keeping her on low-dose aspirin alone.

## 2015-05-27 NOTE — Assessment & Plan Note (Signed)
She is not on a statin drug because of statin intolerance. I will have Cyril Mourning discussed with her the possibility of initiating a PCSK9 monoclonal injectable

## 2015-05-27 NOTE — Assessment & Plan Note (Signed)
History of mild to moderate stable pericardial effusion by recent echo performed 05/13/15 without evidence of hemodynamic compromise.

## 2015-05-27 NOTE — Progress Notes (Signed)
05/27/2015 [name]   10-Feb-1941  [id number]  Primary Physician Shelby Brazil, MD Primary Cardiologist: Lorretta Harp MD [name]   HPI:  74year-old female with a history of hypertension, combined chronic heart failure with EF 35% and grade 1 diastolic dysfunction, COPD, CAD, diabetes, perform atrial disease, pulmonary hypertension, GI bleed.  I last saw her in the office 11/11/14. 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 I last saw her in the office 06/10/14. She saw Tenny Craw PA-C 08/01/14. She was hospitalized 04/05/2014 secondary to acute respiratory failure requiring intubation. During the hospitalization her peak troponin was > 20 she had pulmonary edema requiring IV diuresing. Echo was done EF is 35-40% she did undergo left heart cath 04/09/2014 with three-vessel disease involving proximal and mid LAD, severe distal left main, ostial circumflex and RCA. PCI was not feasible due to coronary anatomy. T CTS evaluated her for bypass grafting but she was found to be a poor surgical candidate. PCI was felt to be the safest intervention at that point. 04/16/2014 she underwent Impala insertion IVUS, rotational atherectomy of the LAD, angioplasty and stenting of the proximal and mid LAD with Xience Alpine drug-eluting stent. Postop was complicated by hypotension, cardiogenic shock requiring dopamine drip. She also had acute blood loss anemia requiring 2 units packed RBCs. The plan is to use Brilinta alone for her new stent for 12 months and then change to aspirin alone daily. She was mobilized by cardiac rehabilitation, continued to to improve and was finally discharged home 04/21/2014. I saw Pt for follow up on 04/28/14.  She had not started her Iron at that time. She had also complained of some dark stools. Labs were ordered and her H/H Had decreased from 9.4 to 8.8. Arrangements were made for her to see GI which she did and was then  admitted for transfusion of 2 units of packed cells on 05/09/2014. Plans are also new for virtual colonoscopy with camera.  Since her procedure she has done well except for blood loss anemia of unclear etiology despite aggressive GI evaluation. Initially I had recommended that she stop her aspirin however since she has a drug-eluting stent in LAD and is on Brilenta I suggested that she restart her aspirin with careful followup of her hemoglobin. She has gotten iron infusions since I last saw her and is followed by hematologist as well as a gastroenterologist. A 2-D echocardiogram performed on 11/07/14 revealed marked improvement in her ejection fraction from 35-65%. She continues to have a moderate pericardial effusion which has remained stable without evidence of pericardial tamponade by recent 2-D echocardiogram performed 05/13/15. Her EF was 45-50%. . She denies chest pain or shortness of breath. 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 These were addressed with laser by Dr. Henrene Pastor.   Current Outpatient Prescriptions  Medication Sig Dispense Refill  . albuterol (PROVENTIL HFA;VENTOLIN HFA) 108 (90 BASE) MCG/ACT inhaler Inhale 1 puff into the lungs every 6 (six) hours as needed. FOR SHORTNESS OF BREATH    . ALPRAZolam (XANAX) 0.5 MG tablet Take 0.25 mg by mouth 2 (two) times daily as needed for anxiety or sleep.    Marland Kitchen ammonium lactate (LAC-HYDRIN) 12 % lotion Apply 1 application topically daily as needed for dry skin (psorias).     Marland Kitchen aspirin 81 MG tablet Take 81 mg by mouth daily.    Marland Kitchen  carvedilol (COREG) 3.125 MG tablet Take 1 tablet (3.125 mg total) by mouth 2 (two) times daily with a meal. 180 tablet 3  . Cholecalciferol (VITAMIN D) 2000 UNITS CAPS Take 1 capsule by mouth every morning.    . Darbepoetin Alfa (ARANESP, ALBUMIN FREE, IJ) Inject 1 each as directed every 14 (fourteen) days.     . dorzolamide  (TRUSOPT) 2 % ophthalmic solution Place 1 drop into the right eye 2 (two) times daily.    Marland Kitchen esomeprazole (NEXIUM) 40 MG capsule Take 40 mg by mouth daily before breakfast.    . feeding supplement, GLUCERNA SHAKE, (GLUCERNA SHAKE) LIQD Take 237 mLs by mouth daily at 3 pm.     . ferrous sulfate 325 (65 FE) MG tablet Take 1 tablet (325 mg total) by mouth 2 (two) times daily with a meal. 60 tablet 2  . fluticasone (FLONASE) 50 MCG/ACT nasal spray Place 1 spray into both nostrils at bedtime as needed for allergies.     . Fluticasone-Salmeterol (ADVAIR HFA IN) Inhale 2 puffs into the lungs every morning.     . furosemide (LASIX) 40 MG tablet Take 1 tablet (40 mg total) by mouth daily. 30 tablet   . glimepiride (AMARYL) 2 MG tablet Take 2 mg by mouth every morning.     Marland Kitchen ipratropium-albuterol (DUONEB) 0.5-2.5 (3) MG/3ML SOLN Take 3 mLs by nebulization every 4 (four) hours as needed (shortness of breath).    . Liniments ([employer] ARTHRITIS PAIN RELIEVER EX) Apply 1 application topically 2 (two) times daily as needed (for shoulder pains, knee, back.).     Marland Kitchen Multiple Vitamins-Minerals (ICAPS) CAPS Take 1 capsule by mouth every morning.     . nystatin (MYCOSTATIN) 100000 UNIT/ML suspension Use as directed 5 mLs in the mouth or throat every 8 (eight) hours as needed (sore mouth.).   1  . predniSONE (DELTASONE) 5 MG tablet Take 5 mg by mouth every Monday, Wednesday, and Friday.     . ranitidine (ZANTAC) 150 MG capsule Take 150 mg by mouth at bedtime.     . simethicone (MYLICON) 80 MG chewable tablet Chew 1 tablet (80 mg total) by mouth 4 (four) times daily as needed for flatulence. 30 tablet 0  . Travoprost, BAK Free, (TRAVATAMN) 0.004 % SOLN ophthalmic solution Place 1 drop into both eyes at bedtime.      No current facility-administered medications for this visit.    Allergies  Allergen Reactions  . Amlodipine Besy-Benazepril Hcl Other (See Comments)    Nervous/shakiness  . Statins Other (See Comments)     "can't take any of them; cramp me up" (12/06/2012)  . Tradjenta [Linagliptin] Other (See Comments)    Extreme muscle pains, chest and back pains  . Plavix [Clopidogrel] Rash    Severe burning of the skin.Pt states that the doctor told her that her rash was psoriasis and not related to plavix but she states the rash came from the plavix  . Raptiva [Efalizumab] Rash    History   Social History  . Marital Status: Widowed    Spouse Name: N/A  . Number of Children: 0  . Years of Education: N/A   Occupational History  . 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 Smoking status: Former Smoker -- 1.00 packs/day for 40 years    Types: Cigarettes  . Smokeless tobacco: Former Systems developer    Quit date: 09/28/2013     Comment: pt states that she smokes "every once  in a while"  . Alcohol Use: No     Comment: "quit social drinking years ago"  . Drug Use: No  . Sexual Activity: No   Other Topics Concern  . Not on file   Social History Narrative     Review of Systems: General: negative for chills, fever, night sweats or weight changes.  Cardiovascular: negative for chest pain, dyspnea on exertion, edema, orthopnea, palpitations, paroxysmal nocturnal dyspnea or shortness of breath Dermatological: negative for rash Respiratory: negative for cough or wheezing Urologic: negative for hematuria Abdominal: negative for nausea, vomiting, diarrhea, bright red blood per rectum, melena, or hematemesis Neurologic: negative for visual changes, syncope, or dizziness All other systems reviewed and are otherwise negative except as noted above.    Blood pressure 136/74, pulse 64, height 5\' 4"  (1.626 m), weight 176 lb 1.6 oz (79.878 kg).  General appearance: alert and no distress Neck: no adenopathy, no JVD, supple, symmetrical, trachea midline, thyroid not enlarged, symmetric, no tenderness/mass/nodules and soft left carotid bruit Lungs: clear to auscultation  bilaterally Heart: regular rate and rhythm, S1, S2 normal, no murmur, click, rub or gallop Extremities: extremities normal, atraumatic, no cyanosis or edema  EKG normal sinus rhythm at 64 with nonspecific ST and T-wave changes, septal Q waves and left axis deviation. I personally reviewed this EKG  ASSESSMENT AND PLAN:   Pericardial effusion History of mild to moderate stable pericardial effusion by recent echo performed 05/13/15 without evidence of hemodynamic compromise.  Left-sided carotid artery disease History of moderate left ICA stenosis by duplex ultrasound performed in February of this year and she is neurologically a symptomatically scheduled to have this performed again in February 2017  Hyperlipidemia with target LDL less than 70 She is not on a statin drug because of statin intolerance. I will have Cyril Mourning discussed with her the possibility of initiating a PCSK9 monoclonal injectable  CAD (coronary artery disease), 04/16/14 Impala insertion IVUS, rotational atherectomy of the LAD, angioplasty and stenting of the proximal and mid LAD with Xience Alpine drug-eluting stent History of CAD status post hemodynamically supported left main and LAD high-speed rotational atherectomy, PCI and stenting using Alpine drug-eluting stent performed radially by Dr. Burt Knack 04/16/14. She had an Impella device placed in her left common femoral artery which was removed postprocedure. Her EF was 35% prior to intervention. She recovered nicely with improvement in her ejection fraction.her most recent ejection fraction by 2-D echo was in the 45-50% range. She denies chest pain or shortness of breath. Her Brilenta was discontinued approximately 6 weeks ago because of ongoing GI bleeding. She had endoscopy revealing several duodenal bleeding lesions. She underwent laser therapy by Dr. Henrene Pastor. At this point, given her bleeding issues and the fact that she is over a year post intervention I feel comfortable keeping  her on low-dose aspirin alone.      Lorretta Harp MD FACP,FACC,FAHA, [facility] 05/27/2015 11:01 AM

## 2015-05-27 NOTE — Assessment & Plan Note (Signed)
History of moderate left ICA stenosis by duplex ultrasound performed in February of this year and she is neurologically a symptomatically scheduled to have this performed again in February 2017

## 2015-05-27 NOTE — Patient Instructions (Addendum)
Please make an appointment in 2-3 weeks with our pharmacist, Tommy Medal, for lipid clinic.  Your physician recommends that you return for lab work at your earliest convenience (before your appointment with Erasmo Downer, PharmD) - FASTING.  Your physician recommends that you schedule a follow-up appointment in 6 month with Kerin Ransom, PA-C. Dr Gwenlyn Found recommends that you schedule a follow-up appointment in 1 year. You will receive a reminder letter in the mail two months in advance. If you don't receive a letter, please call our office to schedule the follow-up appointment.

## 2015-06-05 ENCOUNTER — Ambulatory Visit: Payer: Medicare Other

## 2015-06-05 ENCOUNTER — Other Ambulatory Visit (HOSPITAL_BASED_OUTPATIENT_CLINIC_OR_DEPARTMENT_OTHER): Payer: Medicare Other

## 2015-06-05 DIAGNOSIS — D539 Nutritional anemia, unspecified: Secondary | ICD-10-CM

## 2015-06-05 DIAGNOSIS — D649 Anemia, unspecified: Secondary | ICD-10-CM

## 2015-06-05 DIAGNOSIS — D509 Iron deficiency anemia, unspecified: Secondary | ICD-10-CM

## 2015-06-05 DIAGNOSIS — D638 Anemia in other chronic diseases classified elsewhere: Secondary | ICD-10-CM

## 2015-06-05 LAB — CBC & DIFF AND RETIC
BASO%: 0.4 % (ref 0.0–2.0)
Basophils Absolute: 0 10*3/uL (ref 0.0–0.1)
EOS%: 3.4 % (ref 0.0–7.0)
Eosinophils Absolute: 0.3 10*3/uL (ref 0.0–0.5)
HEMATOCRIT: 37 % (ref 34.8–46.6)
HGB: 11.6 g/dL (ref 11.6–15.9)
Immature Retic Fract: 12.5 % — ABNORMAL HIGH (ref 1.60–10.00)
LYMPH#: 1.4 10*3/uL (ref 0.9–3.3)
LYMPH%: 16.7 % (ref 14.0–49.7)
MCH: 28.1 pg (ref 25.1–34.0)
MCHC: 31.4 g/dL — AB (ref 31.5–36.0)
MCV: 89.6 fL (ref 79.5–101.0)
MONO#: 0.8 10*3/uL (ref 0.1–0.9)
MONO%: 9.4 % (ref 0.0–14.0)
NEUT#: 5.8 10*3/uL (ref 1.5–6.5)
NEUT%: 70.1 % (ref 38.4–76.8)
Platelets: 173 10*3/uL (ref 145–400)
RBC: 4.13 10*6/uL (ref 3.70–5.45)
RDW: 15.9 % — AB (ref 11.2–14.5)
Retic %: 2.8 % — ABNORMAL HIGH (ref 0.70–2.10)
Retic Ct Abs: 115.64 10*3/uL — ABNORMAL HIGH (ref 33.70–90.70)
WBC: 8.3 10*3/uL (ref 3.9–10.3)

## 2015-06-05 LAB — COMPREHENSIVE METABOLIC PANEL (CC13)
ALT: 16 U/L (ref 0–55)
AST: 18 U/L (ref 5–34)
Albumin: 3.5 g/dL (ref 3.5–5.0)
Alkaline Phosphatase: 75 U/L (ref 40–150)
Anion Gap: 10 mEq/L (ref 3–11)
BUN: 18.7 mg/dL (ref 7.0–26.0)
CALCIUM: 10.5 mg/dL — AB (ref 8.4–10.4)
CHLORIDE: 102 meq/L (ref 98–109)
CO2: 27 mEq/L (ref 22–29)
CREATININE: 1.2 mg/dL — AB (ref 0.6–1.1)
EGFR: 53 mL/min/{1.73_m2} — ABNORMAL LOW (ref >90)
Glucose: 212 mg/dl — ABNORMAL HIGH (ref 70–140)
Potassium: 4 mEq/L (ref 3.5–5.1)
Sodium: 139 mEq/L (ref 136–145)
Total Bilirubin: 0.31 mg/dL (ref 0.20–1.20)
Total Protein: 7 g/dL (ref 6.4–8.3)

## 2015-06-05 LAB — FERRITIN CHCC: Ferritin: 65 ng/ml (ref 9–269)

## 2015-06-05 MED ORDER — DARBEPOETIN ALFA 300 MCG/0.6ML IJ SOSY: 300.0000 ug | PREFILLED_SYRINGE | Freq: Once | INTRAMUSCULAR | Status: DC

## 2015-06-19 ENCOUNTER — Ambulatory Visit: Payer: Medicare Other

## 2015-06-19 ENCOUNTER — Other Ambulatory Visit (HOSPITAL_BASED_OUTPATIENT_CLINIC_OR_DEPARTMENT_OTHER): Payer: Medicare Other

## 2015-06-19 DIAGNOSIS — D649 Anemia, unspecified: Secondary | ICD-10-CM | POA: Diagnosis not present

## 2015-06-19 DIAGNOSIS — D509 Iron deficiency anemia, unspecified: Secondary | ICD-10-CM

## 2015-06-19 DIAGNOSIS — D539 Nutritional anemia, unspecified: Secondary | ICD-10-CM

## 2015-06-19 LAB — CBC & DIFF AND RETIC
BASO%: 0.4 % (ref 0.0–2.0)
Basophils Absolute: 0 10*3/uL (ref 0.0–0.1)
EOS ABS: 0.3 10*3/uL (ref 0.0–0.5)
EOS%: 3.8 % (ref 0.0–7.0)
HCT: 35 % (ref 34.8–46.6)
HEMOGLOBIN: 11 g/dL — AB (ref 11.6–15.9)
IMMATURE RETIC FRACT: 15.3 % — AB (ref 1.60–10.00)
LYMPH%: 18.7 % (ref 14.0–49.7)
MCH: 28.3 pg (ref 25.1–34.0)
MCHC: 31.4 g/dL — ABNORMAL LOW (ref 31.5–36.0)
MCV: 90 fL (ref 79.5–101.0)
MONO#: 0.8 10*3/uL (ref 0.1–0.9)
MONO%: 9.6 % (ref 0.0–14.0)
NEUT#: 5.5 10*3/uL (ref 1.5–6.5)
NEUT%: 67.5 % (ref 38.4–76.8)
Platelets: 175 10*3/uL (ref 145–400)
RBC: 3.89 10*6/uL (ref 3.70–5.45)
RDW: 16.4 % — ABNORMAL HIGH (ref 11.2–14.5)
Retic %: 4.11 % — ABNORMAL HIGH (ref 0.70–2.10)
Retic Ct Abs: 159.88 10*3/uL — ABNORMAL HIGH (ref 33.70–90.70)
WBC: 8.2 10*3/uL (ref 3.9–10.3)
lymph#: 1.5 10*3/uL (ref 0.9–3.3)

## 2015-06-19 MED ORDER — DARBEPOETIN ALFA 300 MCG/0.6ML IJ SOSY: 300.0000 ug | PREFILLED_SYRINGE | Freq: Once | INTRAMUSCULAR | Status: DC

## 2015-06-25 ENCOUNTER — Ambulatory Visit: Payer: Medicare Other | Admitting: Pharmacist Clinician (PhC)/ Clinical Pharmacy Specialist

## 2015-07-01 ENCOUNTER — Telehealth: Payer: Self-pay | Admitting: Cardiovascular Disease

## 2015-07-01 NOTE — Telephone Encounter (Signed)
Returned call to patient.She stated she wanted Dr.Berry to know she cancelled appointment with Erasmo Downer in lipid clinic.Stated she has problems with transportation and paying copays.Stated her PCP Dr.Kilpatrick advised her not to take new injectable cholesterol medication at this time due to not enough data back.He will do a repeat lipid panel in 11/16 and if cholesterol still elevated he will prescribe a cholesterol medication.Advised to have lipid panel results faxed to Dr.Berry.I will send message to [facility] and let him know.

## 2015-07-01 NOTE — Telephone Encounter (Signed)
Pt called in wanting to speak with Dr. Kennon Holter nurse about some information she needs to give to Dr. Gwenlyn Found in regards to her lipids. Please call  Thanks

## 2015-07-03 ENCOUNTER — Ambulatory Visit (HOSPITAL_BASED_OUTPATIENT_CLINIC_OR_DEPARTMENT_OTHER): Payer: Medicare Other

## 2015-07-03 ENCOUNTER — Other Ambulatory Visit (HOSPITAL_BASED_OUTPATIENT_CLINIC_OR_DEPARTMENT_OTHER): Payer: Medicare Other

## 2015-07-03 VITALS — BP 158/58 | HR 70 | Temp 98.1°F

## 2015-07-03 DIAGNOSIS — D649 Anemia, unspecified: Secondary | ICD-10-CM | POA: Diagnosis not present

## 2015-07-03 DIAGNOSIS — D539 Nutritional anemia, unspecified: Secondary | ICD-10-CM

## 2015-07-03 DIAGNOSIS — D509 Iron deficiency anemia, unspecified: Secondary | ICD-10-CM

## 2015-07-03 LAB — CBC & DIFF AND RETIC
BASO%: 0.4 % (ref 0.0–2.0)
Basophils Absolute: 0 10*3/uL (ref 0.0–0.1)
EOS%: 4 % (ref 0.0–7.0)
Eosinophils Absolute: 0.3 10*3/uL (ref 0.0–0.5)
HCT: 33.3 % — ABNORMAL LOW (ref 34.8–46.6)
HEMOGLOBIN: 10.5 g/dL — AB (ref 11.6–15.9)
IMMATURE RETIC FRACT: 10.2 % — AB (ref 1.60–10.00)
LYMPH%: 17.2 % (ref 14.0–49.7)
MCH: 29.2 pg (ref 25.1–34.0)
MCHC: 31.5 g/dL (ref 31.5–36.0)
MCV: 92.8 fL (ref 79.5–101.0)
MONO#: 0.7 10*3/uL (ref 0.1–0.9)
MONO%: 8.4 % (ref 0.0–14.0)
NEUT#: 5.4 10*3/uL (ref 1.5–6.5)
NEUT%: 70 % (ref 38.4–76.8)
Platelets: 162 10*3/uL (ref 145–400)
RBC: 3.59 10*6/uL — ABNORMAL LOW (ref 3.70–5.45)
RDW: 16.5 % — ABNORMAL HIGH (ref 11.2–14.5)
RETIC CT ABS: 166.58 10*3/uL — AB (ref 33.70–90.70)
Retic %: 4.64 % — ABNORMAL HIGH (ref 0.70–2.10)
WBC: 7.7 10*3/uL (ref 3.9–10.3)
lymph#: 1.3 10*3/uL (ref 0.9–3.3)

## 2015-07-03 MED ORDER — DARBEPOETIN ALFA 300 MCG/0.6ML IJ SOSY
300.0000 ug | PREFILLED_SYRINGE | Freq: Once | INTRAMUSCULAR | Status: AC
  Administered 2015-07-03: 300 ug via SUBCUTANEOUS
  Filled 2015-07-03: qty 0.6

## 2015-07-08 ENCOUNTER — Ambulatory Visit (INDEPENDENT_AMBULATORY_CARE_PROVIDER_SITE_OTHER): Payer: Medicare Other | Admitting: Podiatry

## 2015-07-08 DIAGNOSIS — B351 Tinea unguium: Secondary | ICD-10-CM

## 2015-07-08 DIAGNOSIS — M79674 Pain in right toe(s): Secondary | ICD-10-CM

## 2015-07-08 NOTE — Progress Notes (Signed)
Patient ID: [name], female   DOB: 09-21-1941, 74 y.o.   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

## 2015-07-08 NOTE — Patient Instructions (Signed)
Diabetes and Foot Care Diabetes may cause you to have problems because of poor blood supply (circulation) to your feet and legs. This may cause the skin on your feet to become thinner, break easier, and heal more slowly. Your skin may become dry, and the skin may peel and crack. You may also have nerve damage in your legs and feet causing decreased feeling in them. You may not notice minor injuries to your feet that could lead to infections or more serious problems. Taking care of your feet is one of the most important things you can do for yourself.  HOME CARE INSTRUCTIONS  Wear shoes at all times, even in the house. Do not go barefoot. Bare feet are easily injured.  Check your feet daily for blisters, cuts, and redness. If you cannot see the bottom of your feet, use a mirror or ask someone for help.  Wash your feet with warm water (do not use hot water) and mild soap. Then pat your feet and the areas between your toes until they are completely dry. Do not soak your feet as this can dry your skin.  Apply a moisturizing lotion or petroleum jelly (that does not contain alcohol and is unscented) to the skin on your feet and to dry, brittle toenails. Do not apply lotion between your toes.  Trim your toenails straight across. Do not dig under them or around the cuticle. File the edges of your nails with an emery board or nail file.  Do not cut corns or calluses or try to remove them with medicine.  Wear clean socks or stockings every day. Make sure they are not too tight. Do not wear knee-high stockings since they may decrease blood flow to your legs.  Wear shoes that fit properly and have enough cushioning. To break in new shoes, wear them for just a few hours a day. This prevents you from injuring your feet. Always look in your shoes before you put them on to be sure there are no objects inside.  Do not cross your legs. This may decrease the blood flow to your feet.  If you find a minor scrape,  cut, or break in the skin on your feet, keep it and the skin around it clean and dry. These areas may be cleansed with mild soap and water. Do not cleanse the area with peroxide, alcohol, or iodine.  When you remove an adhesive bandage, be sure not to damage the skin around it.  If you have a wound, look at it several times a day to make sure it is healing.  Do not use heating pads or hot water bottles. They may burn your skin. If you have lost feeling in your feet or legs, you may not know it is happening until it is too late.  Make sure your health care provider performs a complete foot exam at least annually or more often if you have foot problems. Report any cuts, sores, or bruises to your health care provider immediately. SEEK MEDICAL CARE IF:   You have an injury that is not healing.  You have cuts or breaks in the skin.  You have an ingrown nail.  You notice redness on your legs or feet.  You feel burning or tingling in your legs or feet.  You have pain or cramps in your legs and feet.  Your legs or feet are numb.  Your feet always feel cold. SEEK IMMEDIATE MEDICAL CARE IF:   There is increasing redness,   swelling, or pain in or around a wound.  There is a red line that goes up your leg.  Pus is coming from a wound.  You develop a fever or as directed by your health care provider.  You notice a bad smell coming from an ulcer or wound. Document Released: 11/11/2000 Document Revised: 07/17/2013 Document Reviewed: 04/23/2013 ExitCare Patient Information 2015 [employer]. This information is not intended to replace advice given to you by your health care provider. Make sure you discuss any questions you have with your health care provider.  

## 2015-07-09 ENCOUNTER — Other Ambulatory Visit: Payer: Self-pay | Admitting: Cardiovascular Disease

## 2015-07-09 NOTE — Telephone Encounter (Signed)
Rx(s) sent to pharmacy electronically.  

## 2015-07-17 ENCOUNTER — Encounter: Payer: Self-pay | Admitting: Hematology and Oncology

## 2015-07-17 ENCOUNTER — Ambulatory Visit (HOSPITAL_BASED_OUTPATIENT_CLINIC_OR_DEPARTMENT_OTHER): Payer: Medicare Other | Admitting: Hematology and Oncology

## 2015-07-17 ENCOUNTER — Other Ambulatory Visit (HOSPITAL_BASED_OUTPATIENT_CLINIC_OR_DEPARTMENT_OTHER): Payer: Medicare Other

## 2015-07-17 ENCOUNTER — Telehealth: Payer: Self-pay | Admitting: Hematology and Oncology

## 2015-07-17 ENCOUNTER — Ambulatory Visit: Payer: Medicare Other

## 2015-07-17 VITALS — BP 146/46 | HR 76 | Temp 98.1°F | Resp 16 | Ht 64.0 in | Wt 170.0 lb

## 2015-07-17 DIAGNOSIS — D649 Anemia, unspecified: Secondary | ICD-10-CM | POA: Diagnosis not present

## 2015-07-17 DIAGNOSIS — D539 Nutritional anemia, unspecified: Secondary | ICD-10-CM

## 2015-07-17 DIAGNOSIS — D509 Iron deficiency anemia, unspecified: Secondary | ICD-10-CM

## 2015-07-17 LAB — CBC & DIFF AND RETIC
BASO%: 0.3 % (ref 0.0–2.0)
Basophils Absolute: 0 10*3/uL (ref 0.0–0.1)
EOS ABS: 0.3 10*3/uL (ref 0.0–0.5)
EOS%: 3.9 % (ref 0.0–7.0)
HEMATOCRIT: 35.9 % (ref 34.8–46.6)
HGB: 11.1 g/dL — ABNORMAL LOW (ref 11.6–15.9)
Immature Retic Fract: 15.5 % — ABNORMAL HIGH (ref 1.60–10.00)
LYMPH#: 1.4 10*3/uL (ref 0.9–3.3)
LYMPH%: 17.7 % (ref 14.0–49.7)
MCH: 29.1 pg (ref 25.1–34.0)
MCHC: 30.9 g/dL — AB (ref 31.5–36.0)
MCV: 94.2 fL (ref 79.5–101.0)
MONO#: 0.8 10*3/uL (ref 0.1–0.9)
MONO%: 9.7 % (ref 0.0–14.0)
NEUT%: 68.4 % (ref 38.4–76.8)
NEUTROS ABS: 5.3 10*3/uL (ref 1.5–6.5)
Platelets: 172 10*3/uL (ref 145–400)
RBC: 3.81 10*6/uL (ref 3.70–5.45)
RDW: 15.9 % — AB (ref 11.2–14.5)
RETIC %: 5.54 % — AB (ref 0.70–2.10)
RETIC CT ABS: 211.07 10*3/uL — AB (ref 33.70–90.70)
WBC: 7.7 10*3/uL (ref 3.9–10.3)

## 2015-07-17 MED ORDER — DARBEPOETIN ALFA 300 MCG/0.6ML IJ SOSY: 300.0000 ug | PREFILLED_SYRINGE | Freq: Once | INTRAMUSCULAR | Status: DC

## 2015-07-17 NOTE — Assessment & Plan Note (Signed)
I believe the cause of her anemia is multifactorial, likely component of anemia chronic disease and iron deficiency. After intravenous iron infusion, she has improved. I recommend the use of erythropoeitin stimulating agents. We discussed the risks, benefits, side effects of erythropoietin stimulating agents for anemia, with the goal of keeping the hemoglobin greater than 11 g. She will continue oral iron supplement daily. She has not needed much injection since IV iron. I will space out her injection appointment to every 6 weeks. I will recheck vitamin B-12 and iron studies in October.

## 2015-07-17 NOTE — Telephone Encounter (Signed)
Gave adn printed appt sched and avs for pt for Sept, OCT and DEC

## 2015-07-17 NOTE — Progress Notes (Signed)
[name]  [name],Shelby R, MD SUMMARY OF HEMATOLOGIC HISTORY:  She was found to have abnormal CBC from routine blood work. In 2012, she has normal hemoglobin. Since November 2012 until present time, she has recurrence of anemia requiring blood transfusion, with her blood counts as close to 6.2. She was placed on oral iron supplement for over a year. She also carries a diagnosis of autoimmune psoriatic arthritis and was placed on prednisone in the past. She denies recent chest pain on exertion, pre-syncopal episodes, or palpitations. She has mild weakness since the recent heart attack and shortness of breath on minimal exertion. Her last colonoscopy was several months ago in 2015 with virtual colonoscopy for positive fecal occult blood. Prior endoscopy in June 2011 revealed significant esophagitis. It was repeated again in April 2015 which was unremarkable.  The patient was prescribed oral iron supplements and she takes one tablet twice a day with breakfast and dinner. It is causing some abdominal bloating and constipation. In October 2015, she was given dose of intravenous iron Feraheme without improvement of anemia. On 10/28/2014, she was started on Darbopoeitin 200 g every 3 weeks. On 12/30/2014, dose of darbepoetin was increased to 300 g every 2 weeks. In February and March 2016, she received IV iron INTERVAL HISTORY: [name] 74 y.o. female returns for  Further follow-up. She feels well. She complained of chronic fatigue. The patient denies any recent signs or symptoms of bleeding such as spontaneous epistaxis, hematuria or hematochezia.   I have reviewed the past medical history, past surgical history, social history and family history with the patient and they are unchanged from previous Harding.  ALLERGIES:  is allergic to amlodipine besy-benazepril hcl; statins; tradjenta; plavix; and raptiva.  MEDICATIONS:  Current Outpatient  Prescriptions  Medication Sig Dispense Refill  . albuterol (PROVENTIL HFA;VENTOLIN HFA) 108 (90 BASE) MCG/ACT inhaler Inhale 1 puff into the lungs every 6 (six) hours as needed. FOR SHORTNESS OF BREATH    . ALPRAZolam (XANAX) 0.5 MG tablet Take 0.25 mg by mouth 2 (two) times daily as needed for anxiety or sleep.    Marland Kitchen ammonium lactate (LAC-HYDRIN) 12 % lotion Apply 1 application topically daily as needed for dry skin (psorias).     Marland Kitchen aspirin 81 MG tablet Take 81 mg by mouth daily.    . carvedilol (COREG) 3.125 MG tablet Take 1 tablet by mouth 2  (two) times daily with   meals 180 tablet 3  . Cholecalciferol (VITAMIN D) 2000 UNITS CAPS Take 1 capsule by mouth every morning.    . Darbepoetin Alfa (ARANESP, ALBUMIN FREE, IJ) Inject 1 each as directed every 14 (fourteen) days.     . dorzolamide (TRUSOPT) 2 % ophthalmic solution Place 1 drop into the right eye 2 (two) times daily.    Marland Kitchen esomeprazole (NEXIUM) 40 MG capsule Take 40 mg by mouth daily before breakfast.    . feeding supplement, GLUCERNA SHAKE, (GLUCERNA SHAKE) LIQD Take 237 mLs by mouth daily at 3 pm.     . ferrous sulfate 325 (65 FE) MG tablet Take 1 tablet (325 mg total) by mouth 2 (two) times daily with a meal. 60 tablet 2  . fluticasone (FLONASE) 50 MCG/ACT nasal spray Place 1 spray into both nostrils at bedtime as needed for allergies.     . Fluticasone-Salmeterol (ADVAIR HFA IN) Inhale 2 puffs into the lungs every morning.     . furosemide (LASIX) 40 MG tablet Take 1 tablet (  40 mg total) by mouth daily. 30 tablet   . glimepiride (AMARYL) 2 MG tablet Take 2 mg by mouth every morning.     Marland Kitchen ipratropium-albuterol (DUONEB) 0.5-2.5 (3) MG/3ML SOLN Take 3 mLs by nebulization every 4 (four) hours as needed (shortness of breath).    . Liniments ([employer] ARTHRITIS PAIN RELIEVER EX) Apply 1 application topically 2 (two) times daily as needed (for shoulder pains, knee, back.).     Marland Kitchen Multiple Vitamins-Minerals (ICAPS) CAPS Take 1 capsule by mouth every  morning.     . nystatin (MYCOSTATIN) 100000 UNIT/ML suspension Use as directed 5 mLs in the mouth or throat every 8 (eight) hours as needed (sore mouth.).   1  . ONE TOUCH ULTRA TEST test strip     . predniSONE (DELTASONE) 5 MG tablet Take 5 mg by mouth every Monday, Wednesday, and Friday.     . ranitidine (ZANTAC) 150 MG capsule Take 150 mg by mouth at bedtime.     . simethicone (MYLICON) 80 MG chewable tablet Chew 1 tablet (80 mg total) by mouth 4 (four) times daily as needed for flatulence. 30 tablet 0  . Travoprost, BAK Free, (TRAVATAMN) 0.004 % SOLN ophthalmic solution Place 1 drop into both eyes at bedtime.      No current facility-administered medications for this visit.     REVIEW OF SYSTEMS:   Constitutional: Denies fevers, chills or night sweats Eyes: Denies blurriness of vision Ears, nose, mouth, throat, and face: Denies mucositis or sore throat Respiratory: Denies cough, dyspnea or wheezes Cardiovascular: Denies palpitation, chest discomfort or lower extremity swelling Gastrointestinal:  Denies nausea, heartburn or change in bowel habits Skin: Denies abnormal skin rashes Lymphatics: Denies new lymphadenopathy or easy bruising Neurological:Denies numbness, tingling or new weaknesses Behavioral/Psych: Mood is stable, no new changes  All other systems were reviewed with the patient and are negative.  PHYSICAL EXAMINATION: ECOG PERFORMANCE STATUS: 0 - Asymptomatic  Filed Vitals:   07/17/15 1100  BP: 146/46  Pulse: 76  Temp: 98.1 F (36.7 C)  Resp: 16   Filed Weights   07/17/15 1100  Weight: 170 lb (77.111 kg)    GENERAL:alert, no distress and comfortable SKIN: skin color, texture, turgor are normal, no rashes or significant lesions EYES: normal, Conjunctiva are pink and non-injected, sclera clear Musculoskeletal:no cyanosis of digits and no clubbing  NEURO: alert & oriented x 3 with fluent speech, no focal motor/sensory deficits  LABORATORY DATA:  I have  reviewed the data as listed Results for orders placed or performed in visit on 07/17/15 (from the past 48 hour(s))  CBC & Diff and Retic     Status: Abnormal   Collection Time: 07/17/15  9:32 AM  Result Value Ref Range   WBC 7.7 3.9 - 10.3 10e3/uL   NEUT# 5.3 1.5 - 6.5 10e3/uL   HGB 11.1 (L) 11.6 - 15.9 g/dL   HCT 35.9 34.8 - 46.6 %   Platelets 172 145 - 400 10e3/uL   MCV 94.2 79.5 - 101.0 fL   MCH 29.1 25.1 - 34.0 pg   MCHC 30.9 (L) 31.5 - 36.0 g/dL   RBC 3.81 3.70 - 5.45 10e6/uL   RDW 15.9 (H) 11.2 - 14.5 %   lymph# 1.4 0.9 - 3.3 10e3/uL   MONO# 0.8 0.1 - 0.9 10e3/uL   Eosinophils Absolute 0.3 0.0 - 0.5 10e3/uL   Basophils Absolute 0.0 0.0 - 0.1 10e3/uL   NEUT% 68.4 38.4 - 76.8 %   LYMPH% 17.7 14.0 - 49.7 %  MONO% 9.7 0.0 - 14.0 %   EOS% 3.9 0.0 - 7.0 %   BASO% 0.3 0.0 - 2.0 %   Retic % 5.54 (H) 0.70 - 2.10 %   Retic Ct Abs 211.07 (H) 33.70 - 90.70 10e3/uL   Immature Retic Fract 15.50 (H) 1.60 - 10.00 %    Lab Results  Component Value Date   WBC 7.7 07/17/2015   HGB 11.1* 07/17/2015   HCT 35.9 07/17/2015   MCV 94.2 07/17/2015   PLT 172 07/17/2015    ASSESSMENT & PLAN:  Deficiency anemia I believe the cause of her anemia is multifactorial, likely component of anemia chronic disease and iron deficiency. After intravenous iron infusion, she has improved. I recommend the use of erythropoeitin stimulating agents. We discussed the risks, benefits, side effects of erythropoietin stimulating agents for anemia, with the goal of keeping the hemoglobin greater than 11 g. She will continue oral iron supplement daily. She has not needed much injection since IV iron. I will space out her injection appointment to every 6 weeks. I will recheck vitamin B-12 and iron studies in October.     All questions were answered. The patient knows to call the clinic with any problems, questions or concerns. No barriers to learning was detected.  I spent 15 minutes counseling the patient  face to face. The total time spent in the appointment was 20 minutes and more than 50% was on counseling.     [facility], Sarissa Dern, MD 8/19/201611:03 AM

## 2015-07-27 ENCOUNTER — Ambulatory Visit: Payer: Self-pay | Admitting: Surgery

## 2015-07-27 NOTE — H&P (Signed)
History of Present Illness [name]. Shelby Stavely MD; 07/27/2015 10:44 AM) Patient words: left breast mass.  The patient is a 74 year old female who presents with a breast mass. Referred by Dr. Iona Beard Harding/ Dr. Marcelo Harding for left breast mass This is a 74 year old female with multiple medical issues including coronary artery disease managed by Dr. Quay Harding who presents with a 30 year history of a palpable cyst in her left breast. This has enlarged. She has had several aspirations of this area and the cytology always showed benign cyst fluid. This area has become larger and is more firm. Surgery has been recommended to remove the mass entirely. She presents now for surgical consultation. The patient reports no nipple drainage or breast pain. No other significant findings on mammogram. This area was last aspirated in July 2015. The last mammogram was done on 07/13/15 and showed a 2.6 cm cyst in the left breast at 12:00 in the retroareolar region. No other significant findings. Other Problems (Shelby Eversole, LPN; 2/35/5732 20:25 AM) Anxiety Disorder Arthritis Asthma Back Pain Chronic Obstructive Lung Disease Congestive Heart Failure Diabetes Mellitus Diverticulosis Gastric Ulcer Gastroesophageal Reflux Disease General anesthesia - complications Heart murmur Hemorrhoids High blood pressure Inguinal Hernia Kidney Stone Lump In Breast Myocardial infarction Oophorectomy Bilateral. Thyroid Disease Transfusion history Vascular Disease Ventral Hernia Repair  Past Surgical History (Shelby Eversole, LPN; 03/24/622 76:28 AM) Appendectomy Breast Biopsy Left. multiple Bypass Surgery for Poor Blood Flow to Legs Cataract Surgery Bilateral. Foot Surgery Left. Hysterectomy (not due to cancer) - Complete  Diagnostic Studies History (Shelby Eversole, LPN; 02/10/1760 60:73 AM) Colonoscopy within last year Mammogram 1-3 years ago  Allergies (Shelby  Eversole, LPN; 06/06/6268 48:54 AM) Amlodipine Besy-Benazepril HCl *ANTIHYPERTENSIVES* Statins Depletion *DIETARY PRODUCTS/DIETARY MANAGEMENT PRODUCTS* Tradjenta *ANTIDIABETICS* Plavix *HEMATOLOGICAL AGENTS - MISC.* Raptiva *DERMATOLOGICALS*  Medication History (Shelby Eversole, LPN; 05/24/349 09:38 AM) ALPRAZolam (0.5MG  Tablet, Oral) Active. Ammonium Acetate Active. Aspirin (81MG  Tablet Chewable, Oral) Active. Coreg (3.125MG  Tablet, Oral) Active. Vitamin D (2000UNIT Capsule, Oral) Active. Aranesp (Albumin Free) (10MCG/0.4ML Solution, Injection) Active. Trusopt (2% Solution, Ophthalmic) Active. NexIUM (40MG  Capsule DR, Oral) Active. Ferrous Sulfate (325 (65 Fe)MG Tablet, Oral) Active. Flonase (50MCG/ACT Suspension, Nasal) Active. Advair Diskus (100-50MCG/DOSE Aero Pow Br Act, Inhalation) Active. Lasix (40MG  Tablet, Oral) Active. Amaryl (2MG  Tablet, Oral) Active. DuoNeb (0.5-2.5 (3)MG/3ML Solution, Inhalation) Active. Liniments (External) Active. Multiple Vitamin (Oral) Active. Nystatin (100000 UNIT/ML Suspension, Mouth/Throat) Active. PredniSONE (5MG  (21) Tab Ther Pack, Oral) Active. Zantac 150 Maximum Strength (150MG  Tablet, Oral) Active. Simethicone (80MG  Tablet Chewable, Oral) Active. Medications Reconciled  Social History (Shelby Eversole, LPN; 1/82/9937 16:96 AM) Alcohol use Remotely quit alcohol use. Caffeine use Tea. No drug use Tobacco use Former smoker.  Family History Shelby Borer, LPN; 7/89/3810 17:51 AM) Arthritis Brother, Father, Mother, Sister. Bleeding disorder Sister. Breast Cancer Sister. Cancer Mother. Cerebrovascular Accident Sister. Colon Cancer Sister. Colon Polyps Sister. Diabetes Mellitus Sister. Heart disease in female family member before age 15 Respiratory Condition Sister. Thyroid problems Sister.  Pregnancy / Birth History Shelby Borer, LPN; 0/25/[birth year]27 78:24 AM) Age at menarche 84 years. Gravida  1 Maternal age 24-20 Para 0     Review of Systems (Shelby Eversole LPN; 2/35/3614 43:15 AM) General Present- Fatigue. Not Present- Appetite Loss, Chills, Fever, Night Sweats, Weight Gain and Weight Loss. Skin Present- Dryness and Rash. Not Present- Change in Wart/Mole, Hives, Jaundice, New Lesions, Non-Healing Wounds and Ulcer. HEENT Present- Seasonal Allergies, Sinus Pain, Visual Disturbances and Wears glasses/contact lenses. Not Present- Earache, Hearing Loss, Hoarseness, Nose Bleed,  Oral Ulcers, Ringing in the Ears, Sore Throat and Yellow Eyes. Respiratory Present- Difficulty Breathing. Not Present- Bloody sputum, Chronic Cough, Snoring and Wheezing. Breast Present- Breast Mass. Not Present- Breast Pain, Nipple Discharge and Skin Changes. Cardiovascular Present- Difficulty Breathing Lying Down, Leg Cramps, Palpitations and Shortness of Breath. Not Present- Chest Pain, Rapid Heart Rate and Swelling of Extremities. Gastrointestinal Present- Bloating, Constipation, Difficulty Swallowing, Hemorrhoids and Indigestion. Not Present- Abdominal Pain, Bloody Stool, Change in Bowel Habits, Chronic diarrhea, Excessive gas, Gets full quickly at meals, Nausea, Rectal Pain and Vomiting. Female Genitourinary Present- Urgency. Not Present- Frequency, Nocturia, Painful Urination and Pelvic Pain. Musculoskeletal Present- Back Pain, Joint Pain, Joint Stiffness, Muscle Pain, Muscle Weakness and Swelling of Extremities. Neurological Present- Tingling, Trouble walking and Weakness. Not Present- Decreased Memory, Fainting, Headaches, Numbness, Seizures and Tremor. Psychiatric Present- Anxiety. Not Present- Bipolar, Change in Sleep Pattern, Depression, Fearful and Frequent crying. Endocrine Present- Hair Changes. Not Present- Cold Intolerance, Excessive Hunger, Heat Intolerance, Hot flashes and New Diabetes. Hematology Present- Easy Bruising. Not Present- Excessive bleeding, Gland problems, HIV and Persistent  Infections.  Vitals (Shelby Eversole LPN; 0/08/711 19:75 AM) 07/27/2015 10:16 AM Weight: 178.2 lb Height: 64in Body Surface Area: 1.91 m Body Mass Index: 30.59 kg/m Temp.: 98.6F(Oral)  Pulse: 88 (Regular)  BP: 178/80 (Sitting, Left Arm, Standard)     Physical Exam Rodman Key K. Barrett Goldie MD; 07/27/2015 10:45 AM)  The physical exam findings are as follows: Note:WDWN in NAD HEENT: EOMI, sclera anicteric Neck: No masses, no thyromegaly Lungs: CTA bilaterally; normal respiratory effort Breasts: symmetric; firm palpable mass just lateral to the left areola; smooth, lobulated, mobile no right breast masses CV: Regular rate and rhythm; no murmurs Abd: +bowel sounds, soft, non-tender, no masses Ext: Well-perfused; no edema Skin: Warm, dry; no sign of jaundice    Assessment & Plan Rodman Key K. Ami Mally MD; 07/27/2015 10:35 AM)  LARGE MASS OF LEFT BREAST (611.72  N63)  Current Plans Schedule for Surgery - left breast lumpectomy. The surgical procedure has been discussed with the patient. Potential risks, benefits, alternative treatments, and expected outcomes have been explained. All of the patient's questions at this time have been answered. The likelihood of reaching the patient's treatment goal is good. The patient understand the proposed surgical procedure and wishes to proceed.  We will contact you after we receive cardiac clearance from Dr. Gwenlyn Found Pt Education - Fibrocystic Breast Changes: breast cysts   [name]. Georgette Dover, MD, [facility]  General/ Trauma Surgery  07/27/2015 10:48 AM

## 2015-07-28 ENCOUNTER — Encounter: Payer: Self-pay | Admitting: *Deleted

## 2015-07-28 NOTE — Telephone Encounter (Signed)
This encounter was created in error - please disregard.

## 2015-07-29 ENCOUNTER — Telehealth: Payer: Self-pay | Admitting: Cardiology

## 2015-07-29 NOTE — Telephone Encounter (Signed)
Received records from [facility] for appointment with Kerin Ransom, PA on 08/19/15.  Records given to [employer] (medical records) for Luke's schedule on 08/19/15. lp

## 2015-08-14 ENCOUNTER — Ambulatory Visit: Payer: Medicare Other

## 2015-08-14 ENCOUNTER — Other Ambulatory Visit (HOSPITAL_BASED_OUTPATIENT_CLINIC_OR_DEPARTMENT_OTHER): Payer: Medicare Other

## 2015-08-14 DIAGNOSIS — D649 Anemia, unspecified: Secondary | ICD-10-CM

## 2015-08-14 DIAGNOSIS — D509 Iron deficiency anemia, unspecified: Secondary | ICD-10-CM

## 2015-08-14 DIAGNOSIS — D539 Nutritional anemia, unspecified: Secondary | ICD-10-CM

## 2015-08-14 LAB — CBC & DIFF AND RETIC
BASO%: 0.2 % (ref 0.0–2.0)
Basophils Absolute: 0 10*3/uL (ref 0.0–0.1)
EOS ABS: 0.4 10*3/uL (ref 0.0–0.5)
EOS%: 4.2 % (ref 0.0–7.0)
HCT: 35.5 % (ref 34.8–46.6)
HEMOGLOBIN: 11.2 g/dL — AB (ref 11.6–15.9)
IMMATURE RETIC FRACT: 14.7 % — AB (ref 1.60–10.00)
LYMPH%: 13.1 % — AB (ref 14.0–49.7)
MCH: 29.1 pg (ref 25.1–34.0)
MCHC: 31.5 g/dL (ref 31.5–36.0)
MCV: 92.2 fL (ref 79.5–101.0)
MONO#: 0.8 10*3/uL (ref 0.1–0.9)
MONO%: 9.3 % (ref 0.0–14.0)
NEUT#: 6.1 10*3/uL (ref 1.5–6.5)
NEUT%: 73.2 % (ref 38.4–76.8)
PLATELETS: 190 10*3/uL (ref 145–400)
RBC: 3.85 10*6/uL (ref 3.70–5.45)
RDW: 13.8 % (ref 11.2–14.5)
Retic %: 2.83 % — ABNORMAL HIGH (ref 0.70–2.10)
Retic Ct Abs: 108.96 10*3/uL — ABNORMAL HIGH (ref 33.70–90.70)
WBC: 8.4 10*3/uL (ref 3.9–10.3)
lymph#: 1.1 10*3/uL (ref 0.9–3.3)

## 2015-08-14 MED ORDER — DARBEPOETIN ALFA 300 MCG/0.6ML IJ SOSY: 300.0000 ug | PREFILLED_SYRINGE | Freq: Once | INTRAMUSCULAR | Status: DC

## 2015-08-18 ENCOUNTER — Ambulatory Visit: Payer: Medicare Other | Admitting: Physician Assistant

## 2015-08-19 ENCOUNTER — Encounter: Payer: Self-pay | Admitting: Cardiology

## 2015-08-19 ENCOUNTER — Ambulatory Visit (INDEPENDENT_AMBULATORY_CARE_PROVIDER_SITE_OTHER): Payer: Medicare Other | Admitting: Cardiology

## 2015-08-19 DIAGNOSIS — R0789 Other chest pain: Secondary | ICD-10-CM | POA: Diagnosis not present

## 2015-08-19 DIAGNOSIS — I313 Pericardial effusion (noninflammatory): Secondary | ICD-10-CM

## 2015-08-19 DIAGNOSIS — I319 Disease of pericardium, unspecified: Secondary | ICD-10-CM

## 2015-08-19 DIAGNOSIS — N6320 Unspecified lump in the left breast, unspecified quadrant: Secondary | ICD-10-CM | POA: Insufficient documentation

## 2015-08-19 DIAGNOSIS — I3139 Other pericardial effusion (noninflammatory): Secondary | ICD-10-CM

## 2015-08-19 NOTE — Assessment & Plan Note (Signed)
Pt will need surgery

## 2015-08-19 NOTE — Assessment & Plan Note (Signed)
Moderate pericardial effusion on echo June 2016

## 2015-08-19 NOTE — Assessment & Plan Note (Signed)
Pt admits to SSCP "indigestion" in the evening.

## 2015-08-19 NOTE — Assessment & Plan Note (Signed)
Followed by hematology, getting IV Iron infusions

## 2015-08-19 NOTE — Patient Instructions (Signed)
Your physician has requested that you have an echocardiogram. Echocardiography is a painless test that uses sound waves to create images of your heart. It provides your doctor with information about the size and shape of your heart and how well your heart's chambers and valves are working. This procedure takes approximately one hour. There are no restrictions for this procedure.   Your physician has requested that you have a lexiscan myoview. For further information please visit [email or web address]. Please follow instruction sheet, as given.   Your physician recommends that you schedule a follow-up appointment in: 3 months with Dr. Gwenlyn Found.

## 2015-08-19 NOTE — Assessment & Plan Note (Signed)
LAD DES May 2015 after pt turned down for CABG

## 2015-08-19 NOTE — Assessment & Plan Note (Signed)
EF improved to 45-50% after PCI

## 2015-08-19 NOTE — Assessment & Plan Note (Signed)
[name] has been stopped previously secondary to GI bleeding

## 2015-08-19 NOTE — Progress Notes (Signed)
08/19/2015 [name]   [birth year]  [id number]  Primary Physician Leola Brazil, MD Primary Cardiologist: Dr Gwenlyn Found  HPI:  74 y/o AA female followed by Dr Gwenlyn Found with a history of CAD. The pt was turned down for CABG in May 2015. She underwent LAD PCI with DES then. This was complicated but she ultimately did well. Brilinta was stopped secondary to GI bleeding and AVMs. Her EF did improve to 45-50% post PCI though she did have a pericardial effusion on echo without tamponade.             She is in the office today to be seen prior to surgery for a new Lt breast mass. The pt does complain of SSCP "indigestion" she does not take NTG and it does not radiate to her neck or arms.    Current Outpatient Prescriptions  Medication Sig Dispense Refill  . albuterol (PROVENTIL HFA;VENTOLIN HFA) 108 (90 BASE) MCG/ACT inhaler Inhale 1 puff into the lungs every 6 (six) hours as needed. FOR SHORTNESS OF BREATH    . ALPRAZolam (XANAX) 0.5 MG tablet Take 0.25 mg by mouth 2 (two) times daily as needed for anxiety or sleep.    Marland Kitchen ammonium lactate (LAC-HYDRIN) 12 % lotion Apply 1 application topically daily as needed for dry skin (psorias).     Marland Kitchen amoxicillin (AMOXIL) 250 MG capsule Take 1 capsule by mouth every 8 (eight) hours.  1  . aspirin 81 MG tablet Take 81 mg by mouth daily.    . carvedilol (COREG) 3.125 MG tablet Take 1 tablet by mouth 2  (two) times daily with   meals 180 tablet 3  . Cholecalciferol (VITAMIN D) 2000 UNITS CAPS Take 1 capsule by mouth every morning.    . Darbepoetin Alfa (ARANESP, ALBUMIN FREE, IJ) Inject 1 each as directed every 14 (fourteen) days.     . dorzolamide (TRUSOPT) 2 % ophthalmic solution Place 1 drop into the right eye 2 (two) times daily.    Marland Kitchen esomeprazole (NEXIUM) 40 MG capsule Take 40 mg by mouth daily before breakfast.    . feeding supplement, GLUCERNA SHAKE, (GLUCERNA SHAKE) LIQD Take 237 mLs by mouth daily at 3 pm.     . ferrous sulfate 325 (65 FE) MG  tablet Take 1 tablet (325 mg total) by mouth 2 (two) times daily with a meal. 60 tablet 2  . fluticasone (FLONASE) 50 MCG/ACT nasal spray Place 1 spray into both nostrils at bedtime as needed for allergies.     . Fluticasone-Salmeterol (ADVAIR HFA IN) Inhale 2 puffs into the lungs every morning.     . furosemide (LASIX) 40 MG tablet Take 1 tablet (40 mg total) by mouth daily. 30 tablet   . glimepiride (AMARYL) 2 MG tablet Take 2 mg by mouth every morning.     Marland Kitchen ipratropium-albuterol (DUONEB) 0.5-2.5 (3) MG/3ML SOLN Take 3 mLs by nebulization every 4 (four) hours as needed (shortness of breath).    . Liniments ([employer] ARTHRITIS PAIN RELIEVER EX) Apply 1 application topically 2 (two) times daily as needed (for shoulder pains, knee, back.).     Marland Kitchen Multiple Vitamins-Minerals (ICAPS) CAPS Take 1 capsule by mouth every morning.     . nystatin (MYCOSTATIN) 100000 UNIT/ML suspension Use as directed 5 mLs in the mouth or throat every 8 (eight) hours as needed (sore mouth.).   1  . ONE TOUCH ULTRA TEST test strip     . predniSONE (DELTASONE) 5 MG tablet Take 5 mg  by mouth every Monday, Wednesday, and Friday.     . ranitidine (ZANTAC) 150 MG capsule Take 150 mg by mouth at bedtime.     . simethicone (MYLICON) 80 MG chewable tablet Chew 1 tablet (80 mg total) by mouth 4 (four) times daily as needed for flatulence. 30 tablet 0  . Travoprost, BAK Free, (TRAVATAMN) 0.004 % SOLN ophthalmic solution Place 1 drop into both eyes at bedtime.      No current facility-administered medications for this visit.    Allergies  Allergen Reactions  . Amlodipine Besy-Benazepril Hcl Other (See Comments)    Nervous/shakiness  . Statins Other (See Comments)    "can't take any of them; cramp me up" (12/06/2012)  . Tradjenta [Linagliptin] Other (See Comments)    Extreme muscle pains, chest and back pains  . Plavix [Clopidogrel] Rash    Severe burning of the skin.Pt states that the doctor told her that her rash was psoriasis and  not related to plavix but she states the rash came from the plavix  . Raptiva [Efalizumab] Rash    Social History   Social History  . Marital Status: Widowed    Spouse Name: N/A  . Number of Children: 0  . Years of Education: N/A   Occupational History  . 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 Smoking status: Former Smoker -- 1.00 packs/day for 40 years    Types: Cigarettes  . Smokeless tobacco: Former Systems developer    Quit date: 09/28/2013     Comment: pt states that she smokes "every once in a while"  . Alcohol Use: No     Comment: "quit social drinking years ago"  . Drug Use: No  . Sexual Activity: No   Other Topics Concern  . Not on file   Social History Narrative     Review of Systems: General: negative for chills, fever, night sweats or weight changes.  Cardiovascular: negative for chest pain, dyspnea on exertion, edema, orthopnea, palpitations, paroxysmal nocturnal dyspnea or shortness of breath Dermatological: negative for rash Respiratory: negative for cough or wheezing Urologic: negative for hematuria Abdominal: negative for nausea, vomiting, diarrhea, bright red blood per rectum, melena, or hematemesis Neurologic: negative for visual changes, syncope, or dizziness All other systems reviewed and are otherwise negative except as noted above.    Blood pressure 114/52, pulse 68, height 5\' 4"  (1.626 m), weight 175 lb 9.6 oz (79.652 kg).  General appearance: alert, cooperative, no distress and moderately obese Neck: no carotid bruit and no JVD Lungs: scattered rhonchi Heart: regular rate and rhythm Extremities: no edema Skin: Skin color, texture, turgor normal. No rashes or lesions Neurologic: Grossly normal  EKG NSR, septal Qs  ASSESSMENT AND PLAN:   Chest pain with moderate risk of acute coronary syndrome Pt admits to SSCP "indigestion" in the evening.  Cardiomyopathy, ischemic EF improved to 45-50% after PCI  CAD S/P  percutaneous coronary angioplasty LAD DES May 2015 after pt turned down for CABG  Deficiency anemia Followed by hematology, getting IV Iron infusions  Angiodysplasia of intestine with hemorrhage Brilinita has been stopped previously secondary to GI bleeding  Pericardial effusion Moderate pericardial effusion on echo June 2016  Mass of breast, left Pt will need surgery   PLAN  Reviewed with Dr Gwenlyn Found- he would like the pt to have an echo and Myoview. Pre op clearance pending these studies.   Kerin Ransom K PA-C 08/19/2015 10:46 AM

## 2015-08-26 ENCOUNTER — Telehealth (HOSPITAL_COMMUNITY): Payer: Self-pay | Admitting: *Deleted

## 2015-08-26 NOTE — Telephone Encounter (Signed)
Patient given detailed instructions per Myocardial Perfusion Study Information Sheet for test on 08/28/15 at 1145. Patient notified to arrive 15 minutes early and that it is imperative to arrive on time for appointment to keep from having the test rescheduled.  If you need to cancel or reschedule your appointment, please call the office within 24 hours of your appointment. Failure to do so may result in a cancellation of your appointment, and a $50 no show fee. Patient verbalized understanding. Hubbard Robinson, RN

## 2015-08-27 ENCOUNTER — Other Ambulatory Visit (HOSPITAL_COMMUNITY): Payer: Medicare Other

## 2015-08-28 ENCOUNTER — Ambulatory Visit (HOSPITAL_COMMUNITY): Payer: Medicare Other | Attending: Cardiovascular Disease

## 2015-08-28 ENCOUNTER — Other Ambulatory Visit: Payer: Self-pay | Admitting: Cardiology

## 2015-08-28 ENCOUNTER — Other Ambulatory Visit: Payer: Self-pay

## 2015-08-28 ENCOUNTER — Ambulatory Visit (HOSPITAL_BASED_OUTPATIENT_CLINIC_OR_DEPARTMENT_OTHER): Payer: Medicare Other

## 2015-08-28 DIAGNOSIS — I517 Cardiomegaly: Secondary | ICD-10-CM | POA: Insufficient documentation

## 2015-08-28 DIAGNOSIS — R0789 Other chest pain: Secondary | ICD-10-CM

## 2015-08-28 DIAGNOSIS — R29898 Other symptoms and signs involving the musculoskeletal system: Secondary | ICD-10-CM | POA: Insufficient documentation

## 2015-08-28 DIAGNOSIS — I3139 Other pericardial effusion (noninflammatory): Secondary | ICD-10-CM

## 2015-08-28 DIAGNOSIS — I319 Disease of pericardium, unspecified: Secondary | ICD-10-CM | POA: Diagnosis not present

## 2015-08-28 DIAGNOSIS — I313 Pericardial effusion (noninflammatory): Secondary | ICD-10-CM

## 2015-08-28 DIAGNOSIS — Z87891 Personal history of nicotine dependence: Secondary | ICD-10-CM | POA: Insufficient documentation

## 2015-08-28 DIAGNOSIS — R9439 Abnormal result of other cardiovascular function study: Secondary | ICD-10-CM | POA: Diagnosis not present

## 2015-08-28 LAB — MYOCARDIAL PERFUSION IMAGING
LV dias vol: 148 mL
LV sys vol: 90 mL
Peak HR: 83 {beats}/min
RATE: 0.29
Rest HR: 60 {beats}/min
SDS: 10
SRS: 19
SSS: 29
TID: 1

## 2015-08-28 MED ORDER — TECHNETIUM TC 99M SESTAMIBI GENERIC - CARDIOLITE
32.3000 | Freq: Once | INTRAVENOUS | Status: AC | PRN
  Administered 2015-08-28: 32.3 via INTRAVENOUS

## 2015-08-28 MED ORDER — TECHNETIUM TC 99M SESTAMIBI GENERIC - CARDIOLITE
10.1000 | Freq: Once | INTRAVENOUS | Status: AC | PRN
  Administered 2015-08-28: 10.1 via INTRAVENOUS

## 2015-08-28 MED ORDER — REGADENOSON 0.4 MG/5ML IV SOLN
0.4000 mg | Freq: Once | INTRAVENOUS | Status: AC
  Administered 2015-08-28: 0.4 mg via INTRAVENOUS

## 2015-08-31 NOTE — Progress Notes (Signed)
[name], the patient needs to come back to see me in the office to discuss the results

## 2015-09-01 ENCOUNTER — Telehealth: Payer: Self-pay | Admitting: Cardiovascular Disease

## 2015-09-01 NOTE — Telephone Encounter (Signed)
Forward to Dr Gwenlyn Found and Bernardo Heater RN  Patient saw  Kerin Ransom PA 08/19/15 test  ( echo, myoview)were order for clearance

## 2015-09-01 NOTE — Telephone Encounter (Signed)
[name] is calling to get to see if [name] has been cleared for Surgery .Marland Kitchen Please call   Thanks

## 2015-09-03 NOTE — Telephone Encounter (Signed)
Pt scheduled with Dr. Gwenlyn Found on 10/19 @ 10 AM to review myoview results.

## 2015-09-16 ENCOUNTER — Ambulatory Visit
Admission: RE | Admit: 2015-09-16 | Discharge: 2015-09-16 | Disposition: A | Discharge status: Home / Self Care | Payer: Medicare Other | Source: Ambulatory Visit | Attending: Cardiovascular Disease | Admitting: Cardiovascular Disease

## 2015-09-16 ENCOUNTER — Ambulatory Visit (INDEPENDENT_AMBULATORY_CARE_PROVIDER_SITE_OTHER): Payer: Medicare Other | Admitting: Cardiovascular Disease

## 2015-09-16 ENCOUNTER — Encounter: Payer: Self-pay | Admitting: Cardiovascular Disease

## 2015-09-16 VITALS — BP 138/70 | HR 70 | Ht 64.0 in | Wt 174.0 lb

## 2015-09-16 DIAGNOSIS — Z01818 Encounter for other preprocedural examination: Secondary | ICD-10-CM

## 2015-09-16 DIAGNOSIS — I2510 Atherosclerotic heart disease of native coronary artery without angina pectoris: Secondary | ICD-10-CM

## 2015-09-16 DIAGNOSIS — I2583 Coronary atherosclerosis due to lipid rich plaque: Secondary | ICD-10-CM

## 2015-09-16 DIAGNOSIS — Z79899 Other long term (current) drug therapy: Secondary | ICD-10-CM

## 2015-09-16 DIAGNOSIS — I10 Essential (primary) hypertension: Secondary | ICD-10-CM

## 2015-09-16 LAB — CBC WITH DIFFERENTIAL/PLATELET
BASOS ABS: 0 10*3/uL (ref 0.0–0.1)
BASOS PCT: 0 % (ref 0–1)
EOS ABS: 0.2 10*3/uL (ref 0.0–0.7)
EOS PCT: 3 % (ref 0–5)
HEMATOCRIT: 37.9 % (ref 36.0–46.0)
Hemoglobin: 12.1 g/dL (ref 12.0–15.0)
Lymphocytes Relative: 18 % (ref 12–46)
Lymphs Abs: 1.4 10*3/uL (ref 0.7–4.0)
MCH: 29.4 pg (ref 26.0–34.0)
MCHC: 31.9 g/dL (ref 30.0–36.0)
MCV: 92.2 fL (ref 78.0–100.0)
MPV: 10.2 fL (ref 8.6–12.4)
Monocytes Absolute: 0.6 10*3/uL (ref 0.1–1.0)
Monocytes Relative: 7 % (ref 3–12)
NEUTROS PCT: 72 % (ref 43–77)
Neutro Abs: 5.7 10*3/uL (ref 1.7–7.7)
Platelets: 214 10*3/uL (ref 150–400)
RBC: 4.11 MIL/uL (ref 3.87–5.11)
RDW: 14.1 % (ref 11.5–15.5)
WBC: 7.9 10*3/uL (ref 4.0–10.5)

## 2015-09-16 LAB — BASIC METABOLIC PANEL
BUN: 17 mg/dL (ref 7–25)
CALCIUM: 9.9 mg/dL (ref 8.6–10.4)
CHLORIDE: 100 mmol/L (ref 98–110)
CO2: 29 mmol/L (ref 20–31)
CREATININE: 1.11 mg/dL — AB (ref 0.60–0.93)
Glucose, Bld: 263 mg/dL — ABNORMAL HIGH (ref 65–99)
Potassium: 4.5 mmol/L (ref 3.5–5.3)
Sodium: 137 mmol/L (ref 135–146)

## 2015-09-16 LAB — TSH: TSH: 1.119 u[IU]/mL (ref 0.350–4.500)

## 2015-09-16 NOTE — Assessment & Plan Note (Signed)
History of carotid artery disease with duplex evidence of moderate left ICA stenosis on 01/07/15. She is neurologically asymptomatic.we will follow her on an annual basis noninvasively

## 2015-09-16 NOTE — Progress Notes (Signed)
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 I last saw her in the office 05/27/15.. 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 I last saw her in the office 06/10/14. She saw Tenny Craw PA-C 08/01/14. She was hospitalized 04/05/2014 secondary to acute respiratory failure requiring intubation. During the hospitalization her peak troponin was > 20 she had pulmonary edema requiring IV diuresing. Echo was done EF is 35-40% she did undergo left heart cath 04/09/2014 with three-vessel disease involving proximal and mid LAD, severe distal left main, ostial circumflex and RCA. PCI was not feasible due to coronary anatomy. T CTS evaluated her for bypass grafting but she was found to be a poor surgical candidate. PCI was felt to be the safest intervention at that point. 04/16/2014 she underwent Impala insertion IVUS, rotational atherectomy of the LAD, angioplasty and stenting of the proximal and mid LAD with Xience Alpine drug-eluting stent. Postop was complicated by hypotension, cardiogenic shock requiring dopamine drip. She also had acute blood loss anemia requiring 2 units packed RBCs. The plan is to use Brilinta alone for her new stent for 12 months and then change to aspirin alone daily. She was mobilized by cardiac rehabilitation, continued to to improve and was finally discharged home 04/21/2014. I saw Pt for follow up on 04/28/14.  She had not started her Iron at that time. She had also complained of some dark stools. Labs were ordered and her H/H Had decreased from 9.4 to 8.8. Arrangements were made for her to see GI which she did and was then  admitted for transfusion of 2 units of packed cells on 05/09/2014. Plans are also new for virtual colonoscopy with camera.  Since her procedure she has done well except for blood loss anemia of unclear etiology despite aggressive GI evaluation. Initially I had recommended that she stop her aspirin however since she has a drug-eluting stent in LAD and is on Brilenta I suggested that she restart her aspirin with careful followup of her hemoglobin. She has gotten iron infusions since I last saw her and is followed by hematologist as well as a gastroenterologist. A 2-D echocardiogram performed on 11/07/14 revealed marked improvement in her ejection fraction from 35-65%. She continues to have a moderate pericardial effusion which has remained stable without evidence of pericardial tamponade by recent 2-D echocardiogram performed 05/13/15. Her EF was 45-50%. . She denies chest pain or shortness of breath. 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 These were addressed with laser by Dr. Henrene Pastor. She gets occasional chest pain which she attributed to the digestion. She apparently needs a small left breast mass removed under local anesthesia. We obtained a 2-D echo which revealed a decline in her LV function from the 45-50% down to 35% range  As well as a Myoview stress test performed 08/28/15 which showed inferolateral scar with moderate peri-infarct ischemia.   Current Outpatient Prescriptions  Medication Sig Dispense Refill  . albuterol (PROVENTIL HFA;VENTOLIN HFA) 108 (90 BASE) MCG/ACT inhaler Inhale 1 puff into the lungs every 6 (six) hours as needed. FOR SHORTNESS OF BREATH    .  ALPRAZolam (XANAX) 0.5 MG tablet Take 0.25 mg by mouth 2 (two) times daily as needed for anxiety or sleep.    Marland Kitchen ammonium lactate (LAC-HYDRIN) 12 % lotion Apply 1 application topically daily as needed for dry skin (psorias).     Marland Kitchen amoxicillin  (AMOXIL) 250 MG capsule Take 1 capsule by mouth every 8 (eight) hours.  1  . aspirin 81 MG tablet Take 81 mg by mouth daily.    . carvedilol (COREG) 3.125 MG tablet Take 1 tablet by mouth 2  (two) times daily with   meals 180 tablet 3  . Cholecalciferol (VITAMIN D) 2000 UNITS CAPS Take 1 capsule by mouth every morning.    . Darbepoetin Alfa (ARANESP, ALBUMIN FREE, IJ) Inject 1 each as directed every 14 (fourteen) days.     . dorzolamide (TRUSOPT) 2 % ophthalmic solution Place 1 drop into the right eye 2 (two) times daily.    Marland Kitchen esomeprazole (NEXIUM) 40 MG capsule Take 40 mg by mouth daily before breakfast.    . feeding supplement, GLUCERNA SHAKE, (GLUCERNA SHAKE) LIQD Take 237 mLs by mouth daily at 3 pm.     . ferrous sulfate 325 (65 FE) MG tablet Take 1 tablet (325 mg total) by mouth 2 (two) times daily with a meal. 60 tablet 2  . fluticasone (FLONASE) 50 MCG/ACT nasal spray Place 1 spray into both nostrils at bedtime as needed for allergies.     . Fluticasone-Salmeterol (ADVAIR HFA IN) Inhale 2 puffs into the lungs every morning.     . furosemide (LASIX) 40 MG tablet Take 1 tablet (40 mg total) by mouth daily. 30 tablet   . glimepiride (AMARYL) 2 MG tablet Take 2 mg by mouth every morning.     Marland Kitchen ipratropium-albuterol (DUONEB) 0.5-2.5 (3) MG/3ML SOLN Take 3 mLs by nebulization every 4 (four) hours as needed (shortness of breath).    . Liniments (CVS ARTHRITIS PAIN RELIEVER EX) Apply 1 application topically 2 (two) times daily as needed (for shoulder pains, knee, back.).     Marland Kitchen Multiple Vitamins-Minerals (ICAPS) CAPS Take 1 capsule by mouth every morning.     . nystatin (MYCOSTATIN) 100000 UNIT/ML suspension Use as directed 5 mLs in the mouth or throat every 8 (eight) hours as needed (sore mouth.).   1  . ONE TOUCH ULTRA TEST test strip     . predniSONE (DELTASONE) 5 MG tablet Take 5 mg by mouth every Monday, Wednesday, and Friday.     . ranitidine (ZANTAC) 150 MG capsule Take 150 mg by mouth at  bedtime.     . simethicone (MYLICON) 80 MG chewable tablet Chew 1 tablet (80 mg total) by mouth 4 (four) times daily as needed for flatulence. 30 tablet 0  . Travoprost, BAK Free, (TRAVATAMN) 0.004 % SOLN ophthalmic solution Place 1 drop into both eyes at bedtime.      No current facility-administered medications for this visit.    Allergies  Allergen Reactions  . Amlodipine Besy-Benazepril Hcl Other (See Comments)    Nervous/shakiness  . Statins Other (See Comments)    "can't take any of them; cramp me up" (12/06/2012)  . Tradjenta [Linagliptin] Other (See Comments)    Extreme muscle pains, chest and back pains  . Plavix [Clopidogrel] Rash    Severe burning of the skin.Pt states that the doctor told her that her rash was psoriasis and not related to plavix but she states the rash came from the plavix  . Raptiva [Efalizumab] Rash  Social History   Social History  . Marital Status: Widowed    Spouse Name: N/A  . Number of Children: 0  . Years of Education: N/A   Occupational History  . 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 Smoking status: Former Smoker -- 1.00 packs/day for 40 years    Types: Cigarettes  . Smokeless tobacco: Former Systems developer    Quit date: 09/28/2013     Comment: pt states that she smokes "every once in a while"  . Alcohol Use: No     Comment: "quit social drinking years ago"  . Drug Use: No  . Sexual Activity: No   Other Topics Concern  . Not on file   Social History Narrative     Review of Systems: General: negative for chills, fever, night sweats or weight changes.  Cardiovascular: negative for chest pain, dyspnea on exertion, edema, orthopnea, palpitations, paroxysmal nocturnal dyspnea or shortness of breath Dermatological: negative for rash Respiratory: negative for cough or wheezing Urologic: negative for hematuria Abdominal: negative for nausea, vomiting, diarrhea, bright red blood per rectum, melena, or  hematemesis Neurologic: negative for visual changes, syncope, or dizziness All other systems reviewed and are otherwise negative except as noted above.    Blood pressure 138/70, pulse 70, height 5\' 4"  (1.626 m), weight 174 lb (78.926 kg).  General appearance: alert and no distress Neck: no adenopathy, no carotid bruit, no JVD, supple, symmetrical, trachea midline and thyroid not enlarged, symmetric, no tenderness/mass/nodules Lungs: clear to auscultation bilaterally Heart: regular rate and rhythm, S1, S2 normal, no murmur, click, rub or gallop Extremities: extremities normal, atraumatic, no cyanosis or edema  EKG not performed today  ASSESSMENT AND PLAN:   Left-sided carotid artery disease History of carotid artery disease with duplex evidence of moderate left ICA stenosis on 01/07/15. She is neurologically asymptomatic.we will follow her on an annual basis noninvasively  STEMI (ST elevation myocardial infarction) ([place]) History of CAD status post LAD PCI and stenting with high-speed rotational atherectomy through the right radial approach using Impella  hemodynamic support via the left femoral approach performed by Dr. Burt Knack May 2015. She has frequent heartburn which she does not achieve E to her heart. She had a recent Myoview that showed inferolateral scar with moderate peri-infarct ischemia  And a 2-D echo that revealed revealed a decline in his ejection fraction from 45-50% back in June, 35% on 08/28/15. I'm concerned that this may be the ischemic mediated. I'm going to arrange for him to undergo outpatient elective right radial diagnostic cath next week.  Essential hypertension History of hypertension blood pressure measured today 130/70. She is on carvedilol. Continue current meds at current dosing      Lorretta Harp MD [facility], [facility] 09/16/2015 11:06 AM

## 2015-09-16 NOTE — Assessment & Plan Note (Signed)
History of hypertension blood pressure measured today 130/70. She is on carvedilol. Continue current meds at current dosing

## 2015-09-16 NOTE — Patient Instructions (Signed)
Medication Instructions:  Your physician recommends that you continue on your current medications as directed. Please refer to the Current Medication list given to you today.  If you need a refill on your cardiac medications before your next appointment, please call your pharmacy.  Labwork: Your physician recommends that you return for lab work in: TODAY The lab can be found on the FIRST FLOOR of out building in [address]   Testing/Procedures: Your physician has requested that you have a Right Radial cardiac catheterization. Cardiac catheterization is used to diagnose and/or treat various heart conditions. Doctors may recommend this procedure for a number of different reasons. The most common reason is to evaluate chest pain. Chest pain can be a symptom of coronary artery disease (CAD), and cardiac catheterization can show whether plaque is narrowing or blocking your heart's arteries. This procedure is also used to evaluate the valves, as well as measure the blood flow and oxygen levels in different parts of your heart. For further information please visit [email or web address]. SCHEDULE ON 10/24  Following your catheterization, you will not be allowed to drive for 3 days.  No lifting, pushing, or pulling greater that 10 pounds is allowed for 1 week.  You will be required to have the following tests prior to the procedure:  1. Blood work-the blood work can be done no more than 7 days prior to the procedure.  It can be done at any [facility] lab.  There is one downstairs on the first floor of this building and one in the [facility] building [phone]).  2. Chest Xray-the chest xray order has already been placed at the [facility].      Puncture site RIGHT RADIAL   Any Other Special Instructions Will Be Listed Below (If Applicable).

## 2015-09-16 NOTE — Assessment & Plan Note (Signed)
History of CAD status post LAD PCI and stenting with high-speed rotational atherectomy through the right radial approach using Impella  hemodynamic support via the left femoral approach performed by Dr. Burt Knack May 2015. She has frequent heartburn which she does not achieve E to her heart. She had a recent Myoview that showed inferolateral scar with moderate peri-infarct ischemia  And a 2-D echo that revealed revealed a decline in his ejection fraction from 45-50% back in June, 35% on 08/28/15. I'm concerned that this may be the ischemic mediated. I'm going to arrange for him to undergo outpatient elective right radial diagnostic cath next week.

## 2015-09-17 LAB — PROTIME-INR
INR: 1.03 (ref <1.50)
Prothrombin Time: 13.6 seconds (ref 11.6–15.2)

## 2015-09-17 LAB — APTT: aPTT: 29 seconds (ref 24–37)

## 2015-09-18 ENCOUNTER — Other Ambulatory Visit: Payer: Self-pay

## 2015-09-18 DIAGNOSIS — I10 Essential (primary) hypertension: Secondary | ICD-10-CM

## 2015-09-21 ENCOUNTER — Encounter (HOSPITAL_COMMUNITY)
Admission: RE | Disposition: A | Discharge status: Home / Self Care | Payer: Self-pay | Source: Ambulatory Visit | Attending: Cardiovascular Disease

## 2015-09-21 ENCOUNTER — Ambulatory Visit (HOSPITAL_COMMUNITY)
Admission: RE | Admit: 2015-09-21 | Discharge: 2015-09-21 | Disposition: A | Discharge status: Home / Self Care | Payer: Medicare Other | Source: Ambulatory Visit | Attending: Cardiovascular Disease | Admitting: Cardiovascular Disease

## 2015-09-21 ENCOUNTER — Encounter (HOSPITAL_COMMUNITY): Payer: Self-pay | Admitting: Cardiovascular Disease

## 2015-09-21 DIAGNOSIS — M1990 Unspecified osteoarthritis, unspecified site: Secondary | ICD-10-CM | POA: Insufficient documentation

## 2015-09-21 DIAGNOSIS — Z87891 Personal history of nicotine dependence: Secondary | ICD-10-CM | POA: Insufficient documentation

## 2015-09-21 DIAGNOSIS — I2510 Atherosclerotic heart disease of native coronary artery without angina pectoris: Secondary | ICD-10-CM | POA: Insufficient documentation

## 2015-09-21 DIAGNOSIS — J449 Chronic obstructive pulmonary disease, unspecified: Secondary | ICD-10-CM | POA: Insufficient documentation

## 2015-09-21 DIAGNOSIS — I110 Hypertensive heart disease with heart failure: Secondary | ICD-10-CM | POA: Insufficient documentation

## 2015-09-21 DIAGNOSIS — I2583 Coronary atherosclerosis due to lipid rich plaque: Secondary | ICD-10-CM | POA: Diagnosis not present

## 2015-09-21 DIAGNOSIS — E119 Type 2 diabetes mellitus without complications: Secondary | ICD-10-CM | POA: Diagnosis not present

## 2015-09-21 DIAGNOSIS — Z7952 Long term (current) use of systemic steroids: Secondary | ICD-10-CM | POA: Insufficient documentation

## 2015-09-21 DIAGNOSIS — I252 Old myocardial infarction: Secondary | ICD-10-CM | POA: Diagnosis not present

## 2015-09-21 DIAGNOSIS — Z7982 Long term (current) use of aspirin: Secondary | ICD-10-CM | POA: Diagnosis not present

## 2015-09-21 DIAGNOSIS — I272 Other secondary pulmonary hypertension: Secondary | ICD-10-CM | POA: Diagnosis not present

## 2015-09-21 DIAGNOSIS — I5032 Chronic diastolic (congestive) heart failure: Secondary | ICD-10-CM | POA: Diagnosis not present

## 2015-09-21 DIAGNOSIS — Z9861 Coronary angioplasty status: Secondary | ICD-10-CM

## 2015-09-21 HISTORY — PX: CARDIAC CATHETERIZATION: SHX172

## 2015-09-21 LAB — GLUCOSE, CAPILLARY: GLUCOSE-CAPILLARY: 196 mg/dL — AB (ref 65–99)

## 2015-09-21 SURGERY — LEFT HEART CATH AND CORONARY ANGIOGRAPHY
Anesthesia: LOCAL

## 2015-09-21 MED ORDER — MIDAZOLAM HCL 2 MG/2ML IJ SOLN
INTRAMUSCULAR | Status: DC | PRN
  Administered 2015-09-21: 1 mg via INTRAVENOUS

## 2015-09-21 MED ORDER — SODIUM CHLORIDE 0.9 % IJ SOLN: 3.0000 mL | INTRAMUSCULAR | Status: DC | PRN

## 2015-09-21 MED ORDER — ONDANSETRON HCL 4 MG/2ML IJ SOLN: 4.0000 mg | Freq: Four times a day (QID) | INTRAMUSCULAR | Status: DC | PRN

## 2015-09-21 MED ORDER — ASPIRIN 81 MG PO CHEW: 81.0000 mg | CHEWABLE_TABLET | ORAL | Status: DC

## 2015-09-21 MED ORDER — FENTANYL CITRATE (PF) 100 MCG/2ML IJ SOLN
INTRAMUSCULAR | Status: DC | PRN
  Administered 2015-09-21: 25 ug via INTRAVENOUS

## 2015-09-21 MED ORDER — NITROGLYCERIN 1 MG/10 ML FOR IR/CATH LAB
INTRA_ARTERIAL | Status: AC
  Filled 2015-09-21: qty 10

## 2015-09-21 MED ORDER — FENTANYL CITRATE (PF) 100 MCG/2ML IJ SOLN
INTRAMUSCULAR | Status: AC
  Filled 2015-09-21: qty 4

## 2015-09-21 MED ORDER — SODIUM CHLORIDE 0.9 % WEIGHT BASED INFUSION
3.0000 mL/kg/h | INTRAVENOUS | Status: AC
  Administered 2015-09-21: 3 mL/kg/h via INTRAVENOUS

## 2015-09-21 MED ORDER — HEPARIN SODIUM (PORCINE) 1000 UNIT/ML IJ SOLN
INTRAMUSCULAR | Status: DC | PRN
  Administered 2015-09-21: 4000 [IU] via INTRAVENOUS

## 2015-09-21 MED ORDER — HEPARIN (PORCINE) IN NACL 2-0.9 UNIT/ML-% IJ SOLN
INTRAMUSCULAR | Status: AC
  Filled 2015-09-21: qty 1500

## 2015-09-21 MED ORDER — SODIUM CHLORIDE 0.9 % IV SOLN: 250.0000 mL | INTRAVENOUS | Status: DC | PRN

## 2015-09-21 MED ORDER — LIDOCAINE HCL (PF) 1 % IJ SOLN
INTRAMUSCULAR | Status: DC | PRN
  Administered 2015-09-21: 08:00:00

## 2015-09-21 MED ORDER — SODIUM CHLORIDE 0.9 % IV SOLN
INTRAVENOUS | Status: DC | PRN
  Administered 2015-09-21: 79 mL/h via INTRAVENOUS

## 2015-09-21 MED ORDER — ACETAMINOPHEN 325 MG PO TABS: 650.0000 mg | ORAL_TABLET | ORAL | Status: DC | PRN

## 2015-09-21 MED ORDER — HEPARIN SODIUM (PORCINE) 1000 UNIT/ML IJ SOLN
INTRAMUSCULAR | Status: AC
  Filled 2015-09-21: qty 1

## 2015-09-21 MED ORDER — LIDOCAINE HCL (PF) 1 % IJ SOLN
INTRAMUSCULAR | Status: AC
  Filled 2015-09-21: qty 30

## 2015-09-21 MED ORDER — MORPHINE SULFATE (PF) 2 MG/ML IV SOLN: 2.0000 mg | INTRAVENOUS | Status: DC | PRN

## 2015-09-21 MED ORDER — SODIUM CHLORIDE 0.9 % WEIGHT BASED INFUSION: 3.0000 mL/kg/h | INTRAVENOUS | Status: AC

## 2015-09-21 MED ORDER — ASPIRIN 81 MG PO CHEW: 81.0000 mg | CHEWABLE_TABLET | Freq: Every day | ORAL | Status: DC

## 2015-09-21 MED ORDER — IOHEXOL 350 MG/ML SOLN
INTRAVENOUS | Status: DC | PRN
  Administered 2015-09-21: 80 mL via INTRA_ARTERIAL

## 2015-09-21 MED ORDER — SODIUM CHLORIDE 0.9 % IJ SOLN: 3.0000 mL | Freq: Two times a day (BID) | INTRAMUSCULAR | Status: DC

## 2015-09-21 MED ORDER — VERAPAMIL HCL 2.5 MG/ML IV SOLN
INTRAVENOUS | Status: AC
  Filled 2015-09-21: qty 2

## 2015-09-21 MED ORDER — VERAPAMIL HCL 2.5 MG/ML IV SOLN
INTRA_ARTERIAL | Status: DC | PRN
  Administered 2015-09-21: 08:00:00 via INTRA_ARTERIAL

## 2015-09-21 MED ORDER — MIDAZOLAM HCL 2 MG/2ML IJ SOLN
INTRAMUSCULAR | Status: AC
  Filled 2015-09-21: qty 4

## 2015-09-21 MED ORDER — SODIUM CHLORIDE 0.9 % WEIGHT BASED INFUSION: 1.0000 mL/kg/h | INTRAVENOUS | Status: DC

## 2015-09-21 SURGICAL SUPPLY — 11 items

## 2015-09-21 NOTE — Discharge Instructions (Signed)
Radial Site Care °Refer to this sheet in the next few weeks. These instructions provide you with information about caring for yourself after your procedure. Your health care provider may also give you more specific instructions. Your treatment has been planned according to current medical practices, but problems sometimes occur. Call your health care provider if you have any problems or questions after your procedure. °WHAT TO EXPECT AFTER THE PROCEDURE °After your procedure, it is typical to have the following: °· Bruising at the radial site that usually fades within 1-2 weeks. °· Blood collecting in the tissue (hematoma) that may be painful to the touch. It should usually decrease in size and tenderness within 1-2 weeks. °HOME CARE INSTRUCTIONS °· Take medicines only as directed by your health care provider. °· You may shower 24-48 hours after the procedure or as directed by your health care provider. Remove the bandage (dressing) and gently wash the site with plain soap and water. Pat the area dry with a clean towel. Do not rub the site, because this may cause bleeding. °· Do not take baths, swim, or use a hot tub until your health care provider approves. °· Check your insertion site every day for redness, swelling, or drainage. °· Do not apply powder or lotion to the site. °· Do not flex or bend the affected arm for 24 hours or as directed by your health care provider. °· Do not push or pull heavy objects with the affected arm for 24 hours or as directed by your health care provider. °· Do not lift over 10 lb (4.5 kg) for 5 days after your procedure or as directed by your health care provider. °· Ask your health care provider when it is okay to: °¨ Return to work or school. °¨ Resume usual physical activities or sports. °¨ Resume sexual activity. °· Do not drive home if you are discharged the same day as the procedure. Have someone else drive you. °· You may drive 24 hours after the procedure unless otherwise  instructed by your health care provider. °· Do not operate machinery or power tools for 24 hours after the procedure. °· If your procedure was done as an outpatient procedure, which means that you went home the same day as your procedure, a responsible adult should be with you for the first 24 hours after you arrive home. °· Keep all follow-up visits as directed by your health care provider. This is important. °SEEK MEDICAL CARE IF: °· You have a fever. °· You have chills. °· You have increased bleeding from the radial site. Hold pressure on the site and call 911. °SEEK IMMEDIATE MEDICAL CARE IF: °· You have unusual pain at the radial site. °· You have redness, warmth, or swelling at the radial site. °· You have drainage (other than a small amount of blood on the dressing) from the radial site. °· The radial site is bleeding, and the bleeding does not stop after 30 minutes of holding steady pressure on the site. °· Your arm or hand becomes pale, cool, tingly, or numb. °  °This information is not intended to replace advice given to you by your health care provider. Make sure you discuss any questions you have with your health care provider. °  °Document Released: 12/17/2010 Document Revised: 12/05/2014 Document Reviewed: 06/02/2014 °Elsevier Interactive Patient Education ©2016 [employer] ° °

## 2015-09-21 NOTE — Interval H&P Note (Signed)
Cath Lab Visit (complete for each Cath Lab visit)  Clinical Evaluation Leading to the Procedure:   ACS: No.  Non-ACS:    Anginal Classification: CCS II  Anti-ischemic medical therapy: Minimal Therapy (1 class of medications)  Non-Invasive Test Results: Intermediate-risk stress test findings: cardiac mortality 1-3%/year  Prior CABG: No previous CABG      History and Physical Interval Note:  09/21/2015 7:33 AM  [name]  has presented today for surgery, with the diagnosis of abnormal stress test  The various methods of treatment have been discussed with the patient and family. After consideration of risks, benefits and other options for treatment, the patient has consented to  Procedure(s): Left Heart Cath and Coronary Angiography (N/A) as a surgical intervention .  The patient's history has been reviewed, patient examined, no change in status, stable for surgery.  I have reviewed the patient's chart and labs.  Questions were answered to the patient's satisfaction.     Quay Burow

## 2015-09-21 NOTE — H&P (View-Only) (Signed)
09/16/2015 [name]   06/21/1941  [id number]  Primary Physician Shelby Brazil, MD Primary Cardiologist: Shelby Harp MD [name]   HPI:  73year-old female with a history of hypertension, combined chronic heart failure with EF 35% and grade 1 diastolic dysfunction, COPD, CAD, diabetes, perform atrial disease, pulmonary hypertension, GI bleed. I last saw her in the office 05/27/15.. 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 I last saw her in the office 06/10/14. She saw Shelby Craw PA-C 08/01/14. She was hospitalized 04/05/2014 secondary to acute respiratory failure requiring intubation. During the hospitalization her peak troponin was > 20 she had pulmonary edema requiring IV diuresing. Echo was done EF is 35-40% she did undergo left heart cath 04/09/2014 with three-vessel disease involving proximal and mid LAD, severe distal left main, ostial circumflex and RCA. PCI was not feasible due to coronary anatomy. T CTS evaluated her for bypass grafting but she was found to be a poor surgical candidate. PCI was felt to be the safest intervention at that point. 04/16/2014 she underwent Impala insertion IVUS, rotational atherectomy of the LAD, angioplasty and stenting of the proximal and mid LAD with Xience Alpine drug-eluting stent. Postop was complicated by hypotension, cardiogenic shock requiring dopamine drip. She also had acute blood loss anemia requiring 2 units packed RBCs. The plan is to use Brilinta alone for her new stent for 12 months and then change to aspirin alone daily. She was mobilized by cardiac rehabilitation, continued to to improve and was finally discharged home 04/21/2014. I saw Pt for follow up on 04/28/14.  She had not started her Iron at that time. She had also complained of some dark stools. Labs were ordered and her H/H Had decreased from 9.4 to 8.8. Arrangements were made for her to see GI which she did and was then  admitted for transfusion of 2 units of packed cells on 05/09/2014. Plans are also new for virtual colonoscopy with camera.  Since her procedure she has done well except for blood loss anemia of unclear etiology despite aggressive GI evaluation. Initially I had recommended that she stop her aspirin however since she has a drug-eluting stent in LAD and is on Brilenta I suggested that she restart her aspirin with careful followup of her hemoglobin. She has gotten iron infusions since I last saw her and is followed by hematologist as well as a gastroenterologist. A 2-D echocardiogram performed on 11/07/14 revealed marked improvement in her ejection fraction from 35-65%. She continues to have a moderate pericardial effusion which has remained stable without evidence of pericardial tamponade by recent 2-D echocardiogram performed 05/13/15. Her EF was 45-50%. . She denies chest pain or shortness of breath. 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 These were addressed with laser by Dr. Henrene Pastor. She gets occasional chest pain which she attributed to the digestion. She apparently needs a small left breast mass removed under local anesthesia. We obtained a 2-D echo which revealed a decline in her LV function from the 45-50% down to 35% range  As well as a Myoview stress test performed 08/28/15 which showed inferolateral scar with moderate peri-infarct ischemia.   Current Outpatient Prescriptions  Medication Sig Dispense Refill  . albuterol (PROVENTIL HFA;VENTOLIN HFA) 108 (90 BASE) MCG/ACT inhaler Inhale 1 puff into the lungs every 6 (six) hours as needed. FOR SHORTNESS OF BREATH    .  ALPRAZolam (XANAX) 0.5 MG tablet Take 0.25 mg by mouth 2 (two) times daily as needed for anxiety or sleep.    Marland Kitchen ammonium lactate (LAC-HYDRIN) 12 % lotion Apply 1 application topically daily as needed for dry skin (psorias).     Marland Kitchen amoxicillin  (AMOXIL) 250 MG capsule Take 1 capsule by mouth every 8 (eight) hours.  1  . aspirin 81 MG tablet Take 81 mg by mouth daily.    . carvedilol (COREG) 3.125 MG tablet Take 1 tablet by mouth 2  (two) times daily with   meals 180 tablet 3  . Cholecalciferol (VITAMIN D) 2000 UNITS CAPS Take 1 capsule by mouth every morning.    . Darbepoetin Alfa (ARANESP, ALBUMIN FREE, IJ) Inject 1 each as directed every 14 (fourteen) days.     . dorzolamide (TRUSOPT) 2 % ophthalmic solution Place 1 drop into the right eye 2 (two) times daily.    Marland Kitchen esomeprazole (NEXIUM) 40 MG capsule Take 40 mg by mouth daily before breakfast.    . feeding supplement, GLUCERNA SHAKE, (GLUCERNA SHAKE) LIQD Take 237 mLs by mouth daily at 3 pm.     . ferrous sulfate 325 (65 FE) MG tablet Take 1 tablet (325 mg total) by mouth 2 (two) times daily with a meal. 60 tablet 2  . fluticasone (FLONASE) 50 MCG/ACT nasal spray Place 1 spray into both nostrils at bedtime as needed for allergies.     . Fluticasone-Salmeterol (ADVAIR HFA IN) Inhale 2 puffs into the lungs every morning.     . furosemide (LASIX) 40 MG tablet Take 1 tablet (40 mg total) by mouth daily. 30 tablet   . glimepiride (AMARYL) 2 MG tablet Take 2 mg by mouth every morning.     Marland Kitchen ipratropium-albuterol (DUONEB) 0.5-2.5 (3) MG/3ML SOLN Take 3 mLs by nebulization every 4 (four) hours as needed (shortness of breath).    . Liniments ([employer] ARTHRITIS PAIN RELIEVER EX) Apply 1 application topically 2 (two) times daily as needed (for shoulder pains, knee, back.).     Marland Kitchen Multiple Vitamins-Minerals (ICAPS) CAPS Take 1 capsule by mouth every morning.     . nystatin (MYCOSTATIN) 100000 UNIT/ML suspension Use as directed 5 mLs in the mouth or throat every 8 (eight) hours as needed (sore mouth.).   1  . ONE TOUCH ULTRA TEST test strip     . predniSONE (DELTASONE) 5 MG tablet Take 5 mg by mouth every Monday, Wednesday, and Friday.     . ranitidine (ZANTAC) 150 MG capsule Take 150 mg by mouth at  bedtime.     . simethicone (MYLICON) 80 MG chewable tablet Chew 1 tablet (80 mg total) by mouth 4 (four) times daily as needed for flatulence. 30 tablet 0  . Travoprost, BAK Free, (TRAVATAMN) 0.004 % SOLN ophthalmic solution Place 1 drop into both eyes at bedtime.      No current facility-administered medications for this visit.    Allergies  Allergen Reactions  . Amlodipine Besy-Benazepril Hcl Other (See Comments)    Nervous/shakiness  . Statins Other (See Comments)    "can't take any of them; cramp me up" (12/06/2012)  . Tradjenta [Linagliptin] Other (See Comments)    Extreme muscle pains, chest and back pains  . Plavix [Clopidogrel] Rash    Severe burning of the skin.Pt states that the doctor told her that her rash was psoriasis and not related to plavix but she states the rash came from the plavix  . Raptiva [Efalizumab] Rash  Social History   Social History  . Marital Status: Widowed    Spouse Name: N/A  . Number of Children: 0  . Years of Education: N/A   Occupational History  . 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 Smoking status: Former Smoker -- 1.00 packs/day for 40 years    Types: Cigarettes  . Smokeless tobacco: Former Systems developer    Quit date: 09/28/2013     Comment: pt states that she smokes "every once in a while"  . Alcohol Use: No     Comment: "quit social drinking years ago"  . Drug Use: No  . Sexual Activity: No   Other Topics Concern  . Not on file   Social History Narrative     Review of Systems: General: negative for chills, fever, night sweats or weight changes.  Cardiovascular: negative for chest pain, dyspnea on exertion, edema, orthopnea, palpitations, paroxysmal nocturnal dyspnea or shortness of breath Dermatological: negative for rash Respiratory: negative for cough or wheezing Urologic: negative for hematuria Abdominal: negative for nausea, vomiting, diarrhea, bright red blood per rectum, melena, or  hematemesis Neurologic: negative for visual changes, syncope, or dizziness All other systems reviewed and are otherwise negative except as noted above.    Blood pressure 138/70, pulse 70, height 5\' 4"  (1.626 m), weight 174 lb (78.926 kg).  General appearance: alert and no distress Neck: no adenopathy, no carotid bruit, no JVD, supple, symmetrical, trachea midline and thyroid not enlarged, symmetric, no tenderness/mass/nodules Lungs: clear to auscultation bilaterally Heart: regular rate and rhythm, S1, S2 normal, no murmur, click, rub or gallop Extremities: extremities normal, atraumatic, no cyanosis or edema  EKG not performed today  ASSESSMENT AND PLAN:   Left-sided carotid artery disease History of carotid artery disease with duplex evidence of moderate left ICA stenosis on 01/07/15. She is neurologically asymptomatic.we will follow her on an annual basis noninvasively  STEMI (ST elevation myocardial infarction) ([place]) History of CAD status post LAD PCI and stenting with high-speed rotational atherectomy through the right radial approach using Impella  hemodynamic support via the left femoral approach performed by Dr. Burt Knack May 2015. She has frequent heartburn which she does not achieve E to her heart. She had a recent Myoview that showed inferolateral scar with moderate peri-infarct ischemia  And a 2-D echo that revealed revealed a decline in his ejection fraction from 45-50% back in June, 35% on 08/28/15. I'm concerned that this may be the ischemic mediated. I'm going to arrange for him to undergo outpatient elective right radial diagnostic cath next week.  Essential hypertension History of hypertension blood pressure measured today 130/70. She is on carvedilol. Continue current meds at current dosing      Shelby Harp MD [facility], [facility] 09/16/2015 11:06 AM

## 2015-09-22 ENCOUNTER — Telehealth: Payer: Self-pay | Admitting: Cardiovascular Disease

## 2015-09-22 NOTE — Telephone Encounter (Signed)
The patient can proceed with outpatient lumpectomy by Dr. Juanita Laster. If necessary, she can interrupt her antiplatelet medications but will need to restart afterwards

## 2015-09-22 NOTE — Telephone Encounter (Signed)
Pt had Cath yesterday,she needs to ask some questions please.

## 2015-09-22 NOTE — Telephone Encounter (Signed)
Pt stated she is doing well today just tired from cath yesterday.   Pt is wondering is she can go ahead and schedule to have mass removed from her left breast with Dr. Georgette Dover, as she is not schedule back for f/u with Gwenlyn Found until 12/20.   Contacted Dr. Vonna Kotyk office to verify procedure  Requesting surgical clearance:   1. Type of surgery: Left breast lumpectomy - outpatient  2. Surgeon: Dr. Rodman Key Tsue  3. Surgical date: pending  4. Medications that need to be held: [facility] Surgery [phone] phone [phone] fax Attn: Amy Eversole 5. CAD: yes     6. I will defer to: Gwenlyn Found

## 2015-09-23 NOTE — Telephone Encounter (Signed)
Pt made aware she is cleared from cardiac stand point and paper will be sent to Dr. Vonna Kotyk office.

## 2015-09-25 ENCOUNTER — Ambulatory Visit: Payer: Medicare Other

## 2015-09-25 ENCOUNTER — Other Ambulatory Visit (HOSPITAL_BASED_OUTPATIENT_CLINIC_OR_DEPARTMENT_OTHER): Payer: Medicare Other

## 2015-09-25 DIAGNOSIS — D649 Anemia, unspecified: Secondary | ICD-10-CM

## 2015-09-25 DIAGNOSIS — D539 Nutritional anemia, unspecified: Secondary | ICD-10-CM

## 2015-09-25 DIAGNOSIS — D509 Iron deficiency anemia, unspecified: Secondary | ICD-10-CM

## 2015-09-25 LAB — CBC & DIFF AND RETIC
BASO%: 0.2 % (ref 0.0–2.0)
BASOS ABS: 0 10*3/uL (ref 0.0–0.1)
EOS ABS: 0.3 10*3/uL (ref 0.0–0.5)
EOS%: 3.2 % (ref 0.0–7.0)
HEMATOCRIT: 35.9 % (ref 34.8–46.6)
HGB: 11.4 g/dL — ABNORMAL LOW (ref 11.6–15.9)
Immature Retic Fract: 8.3 % (ref 1.60–10.00)
LYMPH#: 1.3 10*3/uL (ref 0.9–3.3)
LYMPH%: 15.7 % (ref 14.0–49.7)
MCH: 29.7 pg (ref 25.1–34.0)
MCHC: 31.8 g/dL (ref 31.5–36.0)
MCV: 93.5 fL (ref 79.5–101.0)
MONO#: 0.7 10*3/uL (ref 0.1–0.9)
MONO%: 8.1 % (ref 0.0–14.0)
NEUT#: 5.9 10*3/uL (ref 1.5–6.5)
NEUT%: 72.8 % (ref 38.4–76.8)
Platelets: 166 10*3/uL (ref 145–400)
RBC: 3.84 10*6/uL (ref 3.70–5.45)
RDW: 13.9 % (ref 11.2–14.5)
RETIC %: 2.82 % — AB (ref 0.70–2.10)
RETIC CT ABS: 108.29 10*3/uL — AB (ref 33.70–90.70)
WBC: 8 10*3/uL (ref 3.9–10.3)

## 2015-09-25 LAB — FERRITIN CHCC: FERRITIN: 87 ng/mL (ref 9–269)

## 2015-09-25 LAB — IRON AND TIBC CHCC
%SAT: 20 % — ABNORMAL LOW (ref 21–57)
IRON: 49 ug/dL (ref 41–142)
TIBC: 246 ug/dL (ref 236–444)
UIBC: 197 ug/dL (ref 120–384)

## 2015-09-25 LAB — VITAMIN B12: VITAMIN B 12: 1113 pg/mL — AB (ref 211–911)

## 2015-09-25 MED ORDER — DARBEPOETIN ALFA 300 MCG/0.6ML IJ SOSY: 300.0000 ug | PREFILLED_SYRINGE | Freq: Once | INTRAMUSCULAR | Status: DC

## 2015-10-05 ENCOUNTER — Encounter (HOSPITAL_COMMUNITY): Payer: Self-pay

## 2015-10-05 ENCOUNTER — Encounter (HOSPITAL_COMMUNITY)
Admission: RE | Admit: 2015-10-05 | Discharge: 2015-10-05 | Disposition: A | Discharge status: Home / Self Care | Payer: Medicare Other | Source: Ambulatory Visit | Attending: Surgery | Admitting: Surgery

## 2015-10-05 DIAGNOSIS — J45909 Unspecified asthma, uncomplicated: Secondary | ICD-10-CM | POA: Diagnosis not present

## 2015-10-05 DIAGNOSIS — D242 Benign neoplasm of left breast: Secondary | ICD-10-CM | POA: Diagnosis not present

## 2015-10-05 DIAGNOSIS — K219 Gastro-esophageal reflux disease without esophagitis: Secondary | ICD-10-CM | POA: Diagnosis not present

## 2015-10-05 DIAGNOSIS — E119 Type 2 diabetes mellitus without complications: Secondary | ICD-10-CM | POA: Diagnosis not present

## 2015-10-05 DIAGNOSIS — Z955 Presence of coronary angioplasty implant and graft: Secondary | ICD-10-CM | POA: Diagnosis not present

## 2015-10-05 DIAGNOSIS — Z90710 Acquired absence of both cervix and uterus: Secondary | ICD-10-CM | POA: Diagnosis not present

## 2015-10-05 DIAGNOSIS — Z87891 Personal history of nicotine dependence: Secondary | ICD-10-CM | POA: Diagnosis not present

## 2015-10-05 DIAGNOSIS — Z7982 Long term (current) use of aspirin: Secondary | ICD-10-CM | POA: Diagnosis not present

## 2015-10-05 DIAGNOSIS — Z79899 Other long term (current) drug therapy: Secondary | ICD-10-CM | POA: Diagnosis not present

## 2015-10-05 DIAGNOSIS — J449 Chronic obstructive pulmonary disease, unspecified: Secondary | ICD-10-CM | POA: Diagnosis not present

## 2015-10-05 DIAGNOSIS — F419 Anxiety disorder, unspecified: Secondary | ICD-10-CM | POA: Diagnosis not present

## 2015-10-05 DIAGNOSIS — I2510 Atherosclerotic heart disease of native coronary artery without angina pectoris: Secondary | ICD-10-CM | POA: Diagnosis not present

## 2015-10-05 HISTORY — DX: Atherosclerotic heart disease of native coronary artery without angina pectoris: I25.10

## 2015-10-05 HISTORY — DX: Acute myocardial infarction, unspecified: I21.9

## 2015-10-05 LAB — CBC
HEMATOCRIT: 35.5 % — AB (ref 36.0–46.0)
Hemoglobin: 11.1 g/dL — ABNORMAL LOW (ref 12.0–15.0)
MCH: 29.1 pg (ref 26.0–34.0)
MCHC: 31.3 g/dL (ref 30.0–36.0)
MCV: 93.2 fL (ref 78.0–100.0)
Platelets: 171 10*3/uL (ref 150–400)
RBC: 3.81 MIL/uL — ABNORMAL LOW (ref 3.87–5.11)
RDW: 14.1 % (ref 11.5–15.5)
WBC: 7.9 10*3/uL (ref 4.0–10.5)

## 2015-10-05 LAB — BASIC METABOLIC PANEL
Anion gap: 7 (ref 5–15)
BUN: 29 mg/dL — AB (ref 6–20)
CHLORIDE: 102 mmol/L (ref 101–111)
CO2: 27 mmol/L (ref 22–32)
Calcium: 9.7 mg/dL (ref 8.9–10.3)
Creatinine, Ser: 1.09 mg/dL — ABNORMAL HIGH (ref 0.44–1.00)
GFR calc Af Amer: 56 mL/min — ABNORMAL LOW (ref >60)
GFR calc non Af Amer: 48 mL/min — ABNORMAL LOW (ref >60)
GLUCOSE: 281 mg/dL — AB (ref 65–99)
POTASSIUM: 4.4 mmol/L (ref 3.5–5.1)
SODIUM: 136 mmol/L (ref 135–145)

## 2015-10-05 LAB — GLUCOSE, CAPILLARY: GLUCOSE-CAPILLARY: 260 mg/dL — AB (ref 65–99)

## 2015-10-05 NOTE — Pre-Procedure Instructions (Signed)
[name]  10/05/2015      [id number] [place] [address], [place] [address] Phone: [phone] Fax: [phone]  [place], [place] [address] [place] [address] [place] [address] Phone: [phone] Fax: [phone]    Your procedure is scheduled on Tuesday, Nov.8.  Report to [facility] Admitting at 8:30 A.M.  Call this number if you have problems the morning of surgery:  [phone]   Remember:  Do not eat food or drink liquids after midnight.  Take these medicines the morning of surgery with A SIP OF WATER: advir inhaler-bring to hospital, albuterol inhaler-bring to hospital,xanax if needed, carvedilol,trusopt eye drops, nexium, dunodeb if needed              Stop aspirin, vitamins, motrin, advil ibuprofen, herbal medicines   How to Manage Your Diabetes Before Surgery   Why is it important to control my blood sugar before and after surgery?   Improving blood sugar levels before and after surgery helps healing and can limit problems.  A way of improving blood sugar control is eating a healthy diet by:  - Eating less sugar and carbohydrates  - Increasing activity/exercise  - Talk with your doctor about reaching your blood sugar goals  High blood sugars (greater than 180 mg/dL) can raise your risk of infections and slow down your recovery so you will need to focus on controlling your diabetes during the weeks before surgery.  Make sure that the doctor who takes care of your diabetes knows about your planned surgery including the date and location.  How do I manage my blood sugars before surgery?   Check your blood sugar at least 4 times a day, 2 days before surgery to make sure that they are not too high or low.   Check your blood sugar the morning of your surgery when you wake up and every 2               hours until you get to the  Short-Stay unit.  If your blood sugar is less than 70 mg/dL, you will need to treat for low blood sugar by:  Treat a low blood sugar (less than 70 mg/dL) with 1/2 cup of clear juice (cranberry or apple), 4 glucose tablets, OR glucose gel.  Recheck blood sugar in 15 minutes after treatment (to make sure it is greater than 70 mg/dL).  If blood sugar is not greater than 70 mg/dL on re-check, call [phone] for further instructions.   Report your blood sugar to the Short-Stay nurse when you get to Short-Stay.  References:  [facility], 2007 "How to Manage your Diabetes Before and After Surgery".  What do I do about my diabetes medications?   Do not take oral diabetes medicines (pills) the morning of surgery.    Do not wear jewelry, make-up or nail polish.  Do not wear lotions, powders, or perfumes.  You may wear deodorant.  Do not shave 48 hours prior to surgery.  Men may shave face and neck.  Do not bring valuables to the hospital.  [facility] is not responsible for any belongings or valuables.  Contacts, dentures or bridgework may not be worn into surgery.  Leave your suitcase in the car.  After surgery it may be brought to your room.  For patients admitted to the hospital, discharge time will  be determined by your treatment team.  Patients discharged the day of surgery will not be allowed to drive home.   Name and phone number of your driver:    Special instructions:  Preparing for surgery  Please read over the following fact sheets that you were given. Pain Booklet, Coughing and Deep Breathing and Surgical Site Infection Prevention

## 2015-10-05 NOTE — Progress Notes (Signed)
Anesthesia Chart Review: Patient is a 74 year old female scheduled for left breast lumpectomy tomorrow by Dr. Georgette Dover. DX: Left breast mass.  History includes former smoker, COPD, HTN, psoriatic arthritis, asthma, GERD, hiatal hernia, CAD/STEMI (she was turned down by Dr. [place] Gum wIth TCTS for CABG 03/2014 due to multiple co-morbidities and underwent IVUS with rotational atherectomy of the LAD with PTCA and DES to proximal and mid LAD 04/16/14), ischemic cardiomyopathy, combined chronic HF, murmur, palpitations, ankylosing spondylitis, carotid occlusive disease, question of liver cirrhosis on 09/2011 CT with "labs showed no evidence of autoimmune hepatitis, chronic viral hepatitis, or other metabolic causes of chronic liver disease" (liver noted to be WNL in parenchymal echogenicity on 2015 and 2014 ultrasounds), GI bleed, duodenal AVMs s/p ablation with APC  by Dr. Henrene Pastor 03/2015 (Brilinta had to be stopped due to GIB/AVMs), DM2, iron deficiency anemia, anxiety, PVD s/p left SFA stent 12/09/12 s/p left TMA '14, nasal sinus surgery, hysterectomy. PCP is listed as Dr. Vincente Liberty. HEM-ONC is Dr. Alvy Bimler.  Cardiologist is Dr. Gwenlyn Found. Following recent cardiac testing, she wrote, "The patient can proceed with outpatient lumpectomy by Dr. Juanita Laster. If necessary, she can interrupt her antiplatelet medications but will need to restart afterwards."  No PAT vitals entered in Epic as of yet.  Meds include Advair, Xanax, albuterol, ASA 81 mg, Coreg, Trusopt and Travoprost ophthalmic, Nexium, ferrous sulfate, Flonase, Lasix, glimepiride, Duoneb, pravastatin, prednisone, Zantac.  09/21/15 EKG: SB at 59 bpm, LAD, septal infarct (age undetermined).   09/21/15 LHC (done for high risk stress test 08/28/15, EF 39%, prior MI with peri-infarct ischemia):   Ost LM to LM lesion, 60% stenosed.  Mid RCA to Dist RCA lesion, 60% stenosed.  Prox RCA to Mid RCA lesion, 35% stenosed.  There is moderate left ventricular systolic  dysfunction. IMPRESSION:Mrs. Mickiewicz has mild LV dysfunction with wall motion abnormalities in the apex and low anterior wall and inferior apex. EF of 45-50%. The proximal LAD stents patent. Circumflex appears free of significant disease. She does have a 50-60% left main stenosis. She has noncritical coronary disease. At this point, I favor medical therapy and antianginal medications. I suspect that if her left main was hemodynamically significant she would not have ischemia in the LAD territory as well.   08/28/15 Echo (limted): Study Conclusions - Left ventricle: Septal apical and inferoapical akinesis. The cavity size was moderately dilated. Wall thickness was increased in a pattern of moderate LVH. The estimated ejection fraction was 35%. Doppler parameters are consistent with abnormal leftventricular relaxation (grade 1 diastolic dysfunction). - Left atrium: The atrium was mildly dilated. - Atrial septum: No defect or patent foramen ovale was identified. - Pericardium, extracardiac: Small pericardial effusion no tamponade. (Previous echo 05/13/15: LVEF 20-25%, grade 1 diastolic dysfunction, mild PI, PA peak pressure 39 mmHg. Mild to moderate size pericardial effusion with no hemodynamic compromise.)  01/07/15 Carotid duplex: Right bulb/proximal ICA: Mild fibrous plaque with no evidence of significant diameter reduction, tortuosity or other vascular abnormality. Left bulb/ICA: Moderate amount of heterogeneous plaque with moderately elevated velocities suggesting 50-69% diameter reduction. No change since 07/01/14. Recommend follow-up study in 12 months.  09/16/15 CXR: IMPRESSION: 1. Cardiomegaly with pulmonary venous congestion. 2. Mild right base subsegmental atelectasis. 3. Small left pleural effusion cannot be excluded.  Preoperative labs noted. Cr 1.09. H/H 11.1/35.5. PLT 171K. Glucose 281, but reports fasting CBG run ~ 150-160. She has had multiple LFTs done between 04/2014-05/2015 which were  WNL.   Patient with significant cardiac history, but  with recent testing and cardiac clearance for this procedure. If no acute changes then I would anticipate that she could proceed as planned. She will get a fasting CBG on arrival.  [name] Hugh [facility] Short Stay Center/Anesthesiology Phone [phone] 10/05/2015 1:39 PM

## 2015-10-05 NOTE — Progress Notes (Addendum)
PCP: Dr. Vincente Liberty Cardiologist: Dr.Berry  Notified Dr. Vonna Kotyk office, spoke to [place], that pt. Hasn't stopped aspirin 81mg . She took it this am.  Blood sugars fasting run 150-160.  Dr. Oralia Rud office called stated last hgb A1C was 09-24-15, results 9.2

## 2015-10-06 ENCOUNTER — Ambulatory Visit (HOSPITAL_COMMUNITY): Payer: Medicare Other | Admitting: Certified Registered Nurse Anesthetist

## 2015-10-06 ENCOUNTER — Encounter (HOSPITAL_COMMUNITY): Payer: Self-pay | Admitting: Certified Registered Nurse Anesthetist

## 2015-10-06 ENCOUNTER — Ambulatory Visit (HOSPITAL_COMMUNITY): Payer: Medicare Other | Admitting: Vascular Surgery

## 2015-10-06 ENCOUNTER — Ambulatory Visit (HOSPITAL_COMMUNITY)
Admission: RE | Admit: 2015-10-06 | Discharge: 2015-10-06 | Disposition: A | Discharge status: Home / Self Care | Payer: Medicare Other | Source: Ambulatory Visit | Attending: Surgery | Admitting: Surgery

## 2015-10-06 ENCOUNTER — Encounter (HOSPITAL_COMMUNITY)
Admission: RE | Disposition: A | Discharge status: Home / Self Care | Payer: Self-pay | Source: Ambulatory Visit | Attending: Surgery

## 2015-10-06 DIAGNOSIS — Z955 Presence of coronary angioplasty implant and graft: Secondary | ICD-10-CM | POA: Insufficient documentation

## 2015-10-06 DIAGNOSIS — D242 Benign neoplasm of left breast: Secondary | ICD-10-CM | POA: Insufficient documentation

## 2015-10-06 DIAGNOSIS — J45909 Unspecified asthma, uncomplicated: Secondary | ICD-10-CM | POA: Insufficient documentation

## 2015-10-06 DIAGNOSIS — Z7982 Long term (current) use of aspirin: Secondary | ICD-10-CM | POA: Insufficient documentation

## 2015-10-06 DIAGNOSIS — I2510 Atherosclerotic heart disease of native coronary artery without angina pectoris: Secondary | ICD-10-CM | POA: Insufficient documentation

## 2015-10-06 DIAGNOSIS — J449 Chronic obstructive pulmonary disease, unspecified: Secondary | ICD-10-CM | POA: Insufficient documentation

## 2015-10-06 DIAGNOSIS — Z79899 Other long term (current) drug therapy: Secondary | ICD-10-CM | POA: Insufficient documentation

## 2015-10-06 DIAGNOSIS — Z90710 Acquired absence of both cervix and uterus: Secondary | ICD-10-CM | POA: Insufficient documentation

## 2015-10-06 DIAGNOSIS — E119 Type 2 diabetes mellitus without complications: Secondary | ICD-10-CM | POA: Insufficient documentation

## 2015-10-06 DIAGNOSIS — Z87891 Personal history of nicotine dependence: Secondary | ICD-10-CM | POA: Insufficient documentation

## 2015-10-06 DIAGNOSIS — F419 Anxiety disorder, unspecified: Secondary | ICD-10-CM | POA: Insufficient documentation

## 2015-10-06 DIAGNOSIS — K219 Gastro-esophageal reflux disease without esophagitis: Secondary | ICD-10-CM | POA: Insufficient documentation

## 2015-10-06 HISTORY — PX: BREAST LUMPECTOMY: SHX2

## 2015-10-06 LAB — GLUCOSE, CAPILLARY
Glucose-Capillary: 223 mg/dL — ABNORMAL HIGH (ref 65–99)
Glucose-Capillary: 176 mg/dL — ABNORMAL HIGH (ref 65–99)

## 2015-10-06 SURGERY — BREAST LUMPECTOMY
Anesthesia: General | Laterality: Left

## 2015-10-06 MED ORDER — SUCCINYLCHOLINE CHLORIDE 20 MG/ML IJ SOLN
INTRAMUSCULAR | Status: AC
  Filled 2015-10-06: qty 1

## 2015-10-06 MED ORDER — MORPHINE SULFATE (PF) 2 MG/ML IV SOLN: 2.0000 mg | INTRAVENOUS | Status: DC | PRN

## 2015-10-06 MED ORDER — ONDANSETRON HCL 4 MG/2ML IJ SOLN
INTRAMUSCULAR | Status: DC | PRN
  Administered 2015-10-06 (×2): 4 mg via INTRAVENOUS

## 2015-10-06 MED ORDER — ROCURONIUM BROMIDE 50 MG/5ML IV SOLN
INTRAVENOUS | Status: AC
  Filled 2015-10-06: qty 1

## 2015-10-06 MED ORDER — LIDOCAINE HCL (CARDIAC) 20 MG/ML IV SOLN
INTRAVENOUS | Status: DC | PRN
  Administered 2015-10-06: 40 mg via INTRAVENOUS

## 2015-10-06 MED ORDER — HYDROCODONE-ACETAMINOPHEN 5-325 MG PO TABS: 1.0000 | ORAL_TABLET | ORAL | Status: DC | PRN

## 2015-10-06 MED ORDER — PHENYLEPHRINE 40 MCG/ML (10ML) SYRINGE FOR IV PUSH (FOR BLOOD PRESSURE SUPPORT)
PREFILLED_SYRINGE | INTRAVENOUS | Status: AC
  Filled 2015-10-06: qty 20

## 2015-10-06 MED ORDER — ONDANSETRON HCL 4 MG/2ML IJ SOLN: 4.0000 mg | INTRAMUSCULAR | Status: DC | PRN

## 2015-10-06 MED ORDER — BUPIVACAINE-EPINEPHRINE (PF) 0.25% -1:200000 IJ SOLN
INTRAMUSCULAR | Status: AC
  Filled 2015-10-06: qty 30

## 2015-10-06 MED ORDER — PROPOFOL 10 MG/ML IV BOLUS
INTRAVENOUS | Status: AC
  Filled 2015-10-06: qty 20

## 2015-10-06 MED ORDER — 0.9 % SODIUM CHLORIDE (POUR BTL) OPTIME
TOPICAL | Status: DC | PRN
  Administered 2015-10-06: 1000 mL

## 2015-10-06 MED ORDER — GLYCOPYRROLATE 0.2 MG/ML IJ SOLN
INTRAMUSCULAR | Status: AC
  Filled 2015-10-06: qty 1

## 2015-10-06 MED ORDER — NEOSTIGMINE METHYLSULFATE 10 MG/10ML IV SOLN
INTRAVENOUS | Status: AC
  Filled 2015-10-06: qty 1

## 2015-10-06 MED ORDER — EPHEDRINE SULFATE 50 MG/ML IJ SOLN
INTRAMUSCULAR | Status: AC
  Filled 2015-10-06: qty 1

## 2015-10-06 MED ORDER — HYDROMORPHONE HCL 1 MG/ML IJ SOLN: 0.2500 mg | INTRAMUSCULAR | Status: DC | PRN

## 2015-10-06 MED ORDER — LACTATED RINGERS IV SOLN
INTRAVENOUS | Status: DC | PRN
  Administered 2015-10-06: 07:00:00 via INTRAVENOUS

## 2015-10-06 MED ORDER — SODIUM CHLORIDE 0.9 % IJ SOLN
INTRAMUSCULAR | Status: AC
  Filled 2015-10-06: qty 10

## 2015-10-06 MED ORDER — FENTANYL CITRATE (PF) 100 MCG/2ML IJ SOLN
INTRAMUSCULAR | Status: DC | PRN
  Administered 2015-10-06 (×2): 25 ug via INTRAVENOUS

## 2015-10-06 MED ORDER — PROMETHAZINE HCL 25 MG/ML IJ SOLN: 6.2500 mg | INTRAMUSCULAR | Status: DC | PRN

## 2015-10-06 MED ORDER — LIDOCAINE HCL (CARDIAC) 20 MG/ML IV SOLN
INTRAVENOUS | Status: AC
  Filled 2015-10-06: qty 10

## 2015-10-06 MED ORDER — CEFAZOLIN SODIUM-DEXTROSE 2-3 GM-% IV SOLR
INTRAVENOUS | Status: AC
  Filled 2015-10-06: qty 50

## 2015-10-06 MED ORDER — FENTANYL CITRATE (PF) 250 MCG/5ML IJ SOLN
INTRAMUSCULAR | Status: AC
  Filled 2015-10-06: qty 5

## 2015-10-06 MED ORDER — PROPOFOL 10 MG/ML IV BOLUS
INTRAVENOUS | Status: DC | PRN
  Administered 2015-10-06: 130 mg via INTRAVENOUS

## 2015-10-06 MED ORDER — BUPIVACAINE-EPINEPHRINE 0.25% -1:200000 IJ SOLN
INTRAMUSCULAR | Status: DC | PRN
  Administered 2015-10-06: 10 mL

## 2015-10-06 MED ORDER — CHLORHEXIDINE GLUCONATE 4 % EX LIQD: 1.0000 "application " | Freq: Once | CUTANEOUS | Status: DC

## 2015-10-06 MED ORDER — EPHEDRINE SULFATE 50 MG/ML IJ SOLN
INTRAMUSCULAR | Status: DC | PRN
  Administered 2015-10-06: 10 mg via INTRAVENOUS
  Administered 2015-10-06: 5 mg via INTRAVENOUS
  Administered 2015-10-06: 10 mg via INTRAVENOUS

## 2015-10-06 MED ORDER — MEPERIDINE HCL 25 MG/ML IJ SOLN: 6.2500 mg | INTRAMUSCULAR | Status: DC | PRN

## 2015-10-06 MED ORDER — PHENYLEPHRINE HCL 10 MG/ML IJ SOLN
INTRAMUSCULAR | Status: DC | PRN
  Administered 2015-10-06: 40 ug via INTRAVENOUS

## 2015-10-06 MED ORDER — CEFAZOLIN SODIUM-DEXTROSE 2-3 GM-% IV SOLR
2.0000 g | INTRAVENOUS | Status: AC
  Administered 2015-10-06: 2 g via INTRAVENOUS

## 2015-10-06 MED ORDER — ONDANSETRON HCL 4 MG/2ML IJ SOLN
INTRAMUSCULAR | Status: AC
  Filled 2015-10-06: qty 8

## 2015-10-06 SURGICAL SUPPLY — 49 items
APL SKNCLS STERI-STRIP NONHPOA (GAUZE/BANDAGES/DRESSINGS) ×1
APPLIER CLIP 9.375 MED OPEN (MISCELLANEOUS) ×3
APR CLP MED 9.3 20 MLT OPN (MISCELLANEOUS) ×1
BENZOIN TINCTURE PRP APPL 2/3 (GAUZE/BANDAGES/DRESSINGS) ×3 IMPLANT
BINDER BREAST LRG (GAUZE/BANDAGES/DRESSINGS) IMPLANT
BINDER BREAST XLRG (GAUZE/BANDAGES/DRESSINGS) IMPLANT
BLADE SURG ROTATE 9660 (MISCELLANEOUS) IMPLANT
CHLORAPREP W/TINT 26ML (MISCELLANEOUS) ×3 IMPLANT
CLIP APPLIE 9.375 MED OPEN (MISCELLANEOUS) IMPLANT
CLIP TI WIDE RED SMALL 24 (CLIP) ×2 IMPLANT
CLOSURE STERI-STRIP 1/2X4 (GAUZE/BANDAGES/DRESSINGS) ×1
CLOSURE WOUND 1/2 X4 (GAUZE/BANDAGES/DRESSINGS) ×1
CLSR STERI-STRIP ANTIMIC 1/2X4 (GAUZE/BANDAGES/DRESSINGS) ×1 IMPLANT
CONT SPEC 4OZ CLIKSEAL STRL BL (MISCELLANEOUS) ×3 IMPLANT
COVER SURGICAL LIGHT HANDLE (MISCELLANEOUS) ×3 IMPLANT
DECANTER SPIKE VIAL GLASS SM (MISCELLANEOUS) ×2 IMPLANT
DRAPE LAPAROTOMY TRNSV 102X78 (DRAPE) ×3 IMPLANT
DRAPE UTILITY XL STRL (DRAPES) ×6 IMPLANT
DRSG TEGADERM 4X4.75 (GAUZE/BANDAGES/DRESSINGS) ×2 IMPLANT
ELECT CAUTERY BLADE 6.4 (BLADE) ×2 IMPLANT
ELECT REM PT RETURN 9FT ADLT (ELECTROSURGICAL) ×3
ELECTRODE REM PT RTRN 9FT ADLT (ELECTROSURGICAL) ×1 IMPLANT
GAUZE SPONGE 2X2 8PLY STRL LF (GAUZE/BANDAGES/DRESSINGS) IMPLANT
GAUZE SPONGE 4X4 12PLY STRL (GAUZE/BANDAGES/DRESSINGS) ×3 IMPLANT
GAUZE SPONGE 4X4 16PLY XRAY LF (GAUZE/BANDAGES/DRESSINGS) ×3 IMPLANT
GLOVE BIO SURGEON STRL SZ7 (GLOVE) ×3 IMPLANT
GLOVE BIOGEL PI IND STRL 7.5 (GLOVE) ×1 IMPLANT
GLOVE BIOGEL PI INDICATOR 7.5 (GLOVE) ×2
GOWN STRL REUS W/ TWL LRG LVL3 (GOWN DISPOSABLE) ×2 IMPLANT
GOWN STRL REUS W/TWL LRG LVL3 (GOWN DISPOSABLE) ×6
KIT BASIN OR (CUSTOM PROCEDURE TRAY) ×3 IMPLANT
KIT MARKER MARGIN INK (KITS) ×2 IMPLANT
KIT ROOM TURNOVER OR (KITS) ×3 IMPLANT
NDL HYPO 25GX1X1/2 BEV (NEEDLE) ×1 IMPLANT
NEEDLE HYPO 25GX1X1/2 BEV (NEEDLE) ×3 IMPLANT
NS IRRIG 1000ML POUR BTL (IV SOLUTION) ×3 IMPLANT
PACK GENERAL/GYN (CUSTOM PROCEDURE TRAY) ×3 IMPLANT
PAD ARMBOARD 7.5X6 YLW CONV (MISCELLANEOUS) ×6 IMPLANT
SPECIMEN JAR MEDIUM (MISCELLANEOUS) ×3 IMPLANT
SPONGE GAUZE 2X2 STER 10/PKG (GAUZE/BANDAGES/DRESSINGS) ×2
STAPLER VISISTAT 35W (STAPLE) ×1 IMPLANT
STRIP CLOSURE SKIN 1/2X4 (GAUZE/BANDAGES/DRESSINGS) ×2 IMPLANT
SUT MNCRL AB 4-0 PS2 18 (SUTURE) ×3 IMPLANT
SUT VIC AB 3-0 SH 27 (SUTURE) ×3
SUT VIC AB 3-0 SH 27X BRD (SUTURE) ×1 IMPLANT
SYR CONTROL 10ML LL (SYRINGE) ×3 IMPLANT
TOWEL OR 17X24 6PK STRL BLUE (TOWEL DISPOSABLE) ×3 IMPLANT
TOWEL OR 17X26 10 PK STRL BLUE (TOWEL DISPOSABLE) ×3 IMPLANT
WATER STERILE IRR 1000ML POUR (IV SOLUTION) IMPLANT

## 2015-10-06 NOTE — Anesthesia Procedure Notes (Signed)
Procedure Name: LMA Insertion Date/Time: 10/06/2015 7:50 AM Performed by: Merdis Delay Pre-anesthesia Checklist: Patient identified, Emergency Drugs available, Suction available, Patient being monitored and Timeout performed Patient Re-evaluated:Patient Re-evaluated prior to inductionOxygen Delivery Method: Circle system utilized Preoxygenation: Pre-oxygenation with 100% oxygen Intubation Type: IV induction LMA: LMA inserted LMA Size: 4.0 Number of attempts: 1 Placement Confirmation: CO2 detector,  breath sounds checked- equal and bilateral and positive ETCO2 Dental Injury: Teeth and Oropharynx as per pre-operative assessment

## 2015-10-06 NOTE — Anesthesia Postprocedure Evaluation (Signed)
Anesthesia Post Note  Patient: [name]  Procedure(s) Performed: Procedure(s) (LRB): LEFT BREAST LUMPECTOMY (Left)  Anesthesia type: General  Patient location: PACU  Post pain: Pain level controlled and Adequate analgesia  Post assessment: Post-op Vital signs reviewed, Patient's Cardiovascular Status Stable, Respiratory Function Stable, Patent Airway and Pain level controlled  Last Vitals:  Filed Vitals:   10/06/15 0856  BP: 154/69  Pulse: 63  Temp: 36.3 C  Resp:     Post vital signs: Reviewed and stable  Level of consciousness: awake, alert  and oriented  Complications: No apparent anesthesia complications

## 2015-10-06 NOTE — Op Note (Signed)
Preop diagnosis: Left breast mass Postop diagnosis: Same Procedure performed: Left breast lumpectomy Surgeon:Jeryn Cerney K. Anesthesia: Gen. via LMA Indications: This is a 74 year old female who presents with a 30 year history of a palpable mass in her left breast. She has had this aspirated before and this was always found to be a benign cyst. The area has become larger and more firm. She has been referred for surgical evaluation for removal of entire mass to prevent recurrence.  Description of procedure: The patient brought to the operating room and placed in a supine position on the operating room table. After an adequate level general anesthesia was obtained her left breast was prepped with ChloraPrep and draped sterile fashion. A timeout was taken to ensure the proper patient and proper procedure. We infiltrated the area around the left nipple with 0.25% Marcaine with epinephrine. A curvilinear circumareolar incision was made around the lateral edge of the nipple. The mass is adherent to the posterior surface of the dermis of the nipple. We dissected the mass away with cautery. We excised the entire mass wall intact. The masses dark blue/purple in color. We excised entire mass and oriented with a paint kit. This sent for pathologic examination. Ears no residual mass remaining in the biopsy cavity. We irrigated thoroughly and inspected for hemostasis. The biopsy cavity was oriented with 5 small clips. We closed the wound with a deep layer of 3-0 Vicryl and a subcuticular layer of 4-0 Monocryl. Station clean dressings were applied. Patient was then extubated and brought to the recovery room in stable condition. All sponge, instrument, and needle counts are correct.  Imogene Burn. Georgette Dover, MD, [facility] Surgery  General/ Trauma Surgery  10/06/2015 8:27 AM

## 2015-10-06 NOTE — Anesthesia Preprocedure Evaluation (Addendum)
Anesthesia Evaluation  Patient identified by MRN, date of birth, ID band Patient awake    Reviewed: Allergy & Precautions, NPO status , Patient's Chart, lab work & pertinent test results  History of Anesthesia Complications Negative for: history of anesthetic complications  Airway Mallampati: II  TM Distance: >3 FB Neck ROM: Full    Dental no notable dental hx. (+) Dental Advisory Given, Edentulous Upper, Edentulous Lower   Pulmonary shortness of breath, asthma , pneumonia, COPD,  COPD inhaler, former smoker,    Pulmonary exam normal breath sounds clear to auscultation       Cardiovascular hypertension, Pt. on medications and Pt. on home beta blockers + CAD, + Past MI, + Cardiac Stents, + Peripheral Vascular Disease and +CHF  Normal cardiovascular exam+ dysrhythmias + Valvular Problems/Murmurs  Rhythm:Regular Rate:Normal   08/28/15 Echo (limted): Study Conclusions - Left ventricle: Septal apical and inferoapical akinesis. The cavity size was moderately dilated. Wall thickness was increased in a pattern of moderate LVH. The estimated ejection fraction was 35%. Doppler parameters are consistent with abnormal leftventricular relaxation (grade 1 diastolic dysfunction). - Left atrium: The atrium was mildly dilated. - Atrial septum: No defect or patent foramen ovale was identified. - Pericardium, extracardiac: Small pericardial effusion no tamponade. (Previous echo 05/13/15: LVEF 46-80%, grade 1 diastolic dysfunction, mild PI, PA peak pressure 39 mmHg. Mild to moderate size pericardial effusion with no hemodynamic compromise.)   Neuro/Psych  Headaches, PSYCHIATRIC DISORDERS Anxiety  Neuromuscular disease    GI/Hepatic Neg liver ROS, hiatal hernia, GERD  Medicated and Controlled,  Endo/Other  diabetes  Renal/GU negative Renal ROS     Musculoskeletal  (+) Arthritis , Hx of ankylosing spondylitis    Abdominal   Peds   Hematology  (+) anemia ,   Anesthesia Other Findings Pain with extension of neck but full extension able to be acheived  Reproductive/Obstetrics negative OB ROS                         Anesthesia Physical  Anesthesia Plan  ASA: IV  Anesthesia Plan: General   Post-op Pain Management:    Induction: Intravenous  Airway Management Planned: LMA  Additional Equipment:   Intra-op Plan:   Post-operative Plan: Extubation in OR  Informed Consent: I have reviewed the patients History and Physical, chart, labs and discussed the procedure including the risks, benefits and alternatives for the proposed anesthesia with the patient or authorized representative who has indicated his/her understanding and acceptance.   Dental advisory given  Plan Discussed with: CRNA  Anesthesia Plan Comments:         Anesthesia Quick Evaluation

## 2015-10-06 NOTE — Discharge Instructions (Signed)
[facility] °Office Phone Number [phone] ° °BREAST BIOPSY/ PARTIAL MASTECTOMY: POST OP INSTRUCTIONS ° °Always review your discharge instruction sheet given to you by the facility where your surgery was performed. ° °IF YOU HAVE DISABILITY OR FAMILY LEAVE FORMS, YOU MUST BRING THEM TO THE OFFICE FOR PROCESSING.  DO NOT GIVE THEM TO YOUR DOCTOR. ° °1. A prescription for pain medication may be given to you upon discharge.  Take your pain medication as prescribed, if needed.  If narcotic pain medicine is not needed, then you may take acetaminophen (Tylenol) or ibuprofen (Advil) as needed. °2. Take your usually prescribed medications unless otherwise directed °3. If you need a refill on your pain medication, please contact your pharmacy.  They will contact our office to request authorization.  Prescriptions will not be filled after 5pm or on week-ends. °4. You should eat very light the first 24 hours after surgery, such as soup, crackers, pudding, etc.  Resume your normal diet the day after surgery. °5. Most patients will experience some swelling and bruising in the breast.  Ice packs and a good support bra will help.  Swelling and bruising can take several days to resolve.  °6. It is common to experience some constipation if taking pain medication after surgery.  Increasing fluid intake and taking a stool softener will usually help or prevent this problem from occurring.  A mild laxative (Milk of Magnesia or Miralax) should be taken according to package directions if there are no bowel movements after 48 hours. °7. Unless discharge instructions indicate otherwise, you may remove your bandages 24-48 hours after surgery, and you may shower at that time.  You may have steri-strips (small skin tapes) in place directly over the incision.  These strips should be left on the skin for 7-10 days.  If your surgeon used skin glue on the incision, you may shower in 24 hours.  The glue will flake off over the  next 2-3 weeks.  Any sutures or staples will be removed at the office during your follow-up visit. °8. ACTIVITIES:  You may resume regular daily activities (gradually increasing) beginning the next day.  Wearing a good support bra or sports bra minimizes pain and swelling.  You may have sexual intercourse when it is comfortable. °a. You may drive when you no longer are taking prescription pain medication, you can comfortably wear a seatbelt, and you can safely maneuver your car and apply brakes. °b. RETURN TO WORK:  ______________________________________________________________________________________ °9. You should see your doctor in the office for a follow-up appointment approximately two weeks after your surgery.  Your doctor’s nurse will typically make your follow-up appointment when she calls you with your pathology report.  Expect your pathology report 2-3 business days after your surgery.  You may call to check if you do not hear from us after three days. °10. OTHER INSTRUCTIONS: _______________________________________________________________________________________________ _____________________________________________________________________________________________________________________________________ °_____________________________________________________________________________________________________________________________________ °_____________________________________________________________________________________________________________________________________ ° °WHEN TO CALL YOUR DOCTOR: °1. Fever over 101.0 °2. Nausea and/or vomiting. °3. Extreme swelling or bruising. °4. Continued bleeding from incision. °5. Increased pain, redness, or drainage from the incision. ° °The clinic staff is available to answer your questions during regular business hours.  Please don’t hesitate to call and ask to speak to one of the nurses for clinical concerns.  If you have a medical emergency, go to the nearest  emergency room or call 911.  A surgeon from [facility] is always on call at the hospital. ° °For further questions, please visit [email or web address]  °

## 2015-10-06 NOTE — H&P (Signed)
History of Present Illness Patient words: left breast mass.  The patient is a 74 year old female who presents with a breast mass. Referred by Dr. Iona Beard Kilpatrick/ Dr. Marcelo Baldy for left breast mass This is a 74 year old female with multiple medical issues including coronary artery disease managed by Dr. Quay Burow who presents with a 30 year history of a palpable cyst in her left breast. This has enlarged. She has had several aspirations of this area and the cytology always showed benign cyst fluid. This area has become larger and is more firm. Surgery has been recommended to remove the mass entirely. She presents now for surgical consultation. The patient reports no nipple drainage or breast pain. No other significant findings on mammogram. This area was last aspirated in July 2015. The last mammogram was done on 07/13/15 and showed a 2.6 cm cyst in the left breast at 12:00 in the retroareolar region. No other significant findings.  Other Problems  Anxiety Disorder Arthritis Asthma Back Pain Chronic Obstructive Lung Disease Congestive Heart Failure Diabetes Mellitus Diverticulosis Gastric Ulcer Gastroesophageal Reflux Disease General anesthesia - complications Heart murmur Hemorrhoids High blood pressure Inguinal Hernia Kidney Stone Lump In Breast Myocardial infarction Oophorectomy Bilateral. Thyroid Disease Transfusion history Vascular Disease Ventral Hernia Repair  Past Surgical History Appendectomy Breast Biopsy Left. multiple Bypass Surgery for Poor Blood Flow to Legs Cataract Surgery Bilateral. Foot Surgery Left. Hysterectomy (not due to cancer) - Complete  Diagnostic Studies History Colonoscopy within last year Mammogram 1-3 years ago  Allergies Amlodipine Besy-Benazepril HCl *ANTIHYPERTENSIVES* Statins Depletion *DIETARY PRODUCTS/DIETARY MANAGEMENT PRODUCTS* Tradjenta *ANTIDIABETICS* Plavix *HEMATOLOGICAL AGENTS -  MISC.* Raptiva *DERMATOLOGICALS*  Medication History  ALPRAZolam (0.5MG  Tablet, Oral) Active. Ammonium Acetate Active. Aspirin (81MG  Tablet Chewable, Oral) Active. Coreg (3.125MG  Tablet, Oral) Active. Vitamin D (2000UNIT Capsule, Oral) Active. Aranesp (Albumin Free) (10MCG/0.4ML Solution, Injection) Active. Trusopt (2% Solution, Ophthalmic) Active. NexIUM (40MG  Capsule DR, Oral) Active. Ferrous Sulfate (325 (65 Fe)MG Tablet, Oral) Active. Flonase (50MCG/ACT Suspension, Nasal) Active. Advair Diskus (100-50MCG/DOSE Aero Pow Br Act, Inhalation) Active. Lasix (40MG  Tablet, Oral) Active. Amaryl (2MG  Tablet, Oral) Active. DuoNeb (0.5-2.5 (3)MG/3ML Solution, Inhalation) Active. Liniments (External) Active. Multiple Vitamin (Oral) Active. Nystatin (100000 UNIT/ML Suspension, Mouth/Throat) Active. PredniSONE (5MG  (21) Tab Ther Pack, Oral) Active. Zantac 150 Maximum Strength (150MG  Tablet, Oral) Active. Simethicone (80MG  Tablet Chewable, Oral) Active. Medications Reconciled  Social History Alcohol use Remotely quit alcohol use. Caffeine use Tea. No drug use Tobacco use Former smoker.  Family History  Arthritis Brother, Father, Mother, Sister. Bleeding disorder Sister. Breast Cancer Sister. Cancer Mother. Cerebrovascular Accident Sister. Colon Cancer Sister. Colon Polyps Sister. Diabetes Mellitus Sister. Heart disease in female family member before age 53 Respiratory Condition Sister. Thyroid problems Sister.  Pregnancy / Birth History Age at menarche 33 years. Gravida 1 Maternal age 63-20 Para 0     Review of Systems  General Present- Fatigue. Not Present- Appetite Loss, Chills, Fever, Night Sweats, Weight Gain and Weight Loss. Skin Present- Dryness and Rash. Not Present- Change in Wart/Mole, Hives, Jaundice, New Lesions, Non-Healing Wounds and Ulcer. HEENT Present- Seasonal Allergies, Sinus Pain, Visual Disturbances and Wears  glasses/contact lenses. Not Present- Earache, Hearing Loss, Hoarseness, Nose Bleed, Oral Ulcers, Ringing in the Ears, Sore Throat and Yellow Eyes. Respiratory Present- Difficulty Breathing. Not Present- Bloody sputum, Chronic Cough, Snoring and Wheezing. Breast Present- Breast Mass. Not Present- Breast Pain, Nipple Discharge and Skin Changes. Cardiovascular Present- Difficulty Breathing Lying Down, Leg Cramps, Palpitations and Shortness of Breath. Not Present- Chest Pain, Rapid Heart  Rate and Swelling of Extremities. Gastrointestinal Present- Bloating, Constipation, Difficulty Swallowing, Hemorrhoids and Indigestion. Not Present- Abdominal Pain, Bloody Stool, Change in Bowel Habits, Chronic diarrhea, Excessive gas, Gets full quickly at meals, Nausea, Rectal Pain and Vomiting. Female Genitourinary Present- Urgency. Not Present- Frequency, Nocturia, Painful Urination and Pelvic Pain. Musculoskeletal Present- Back Pain, Joint Pain, Joint Stiffness, Muscle Pain, Muscle Weakness and Swelling of Extremities. Neurological Present- Tingling, Trouble walking and Weakness. Not Present- Decreased Memory, Fainting, Headaches, Numbness, Seizures and Tremor. Psychiatric Present- Anxiety. Not Present- Bipolar, Change in Sleep Pattern, Depression, Fearful and Frequent crying. Endocrine Present- Hair Changes. Not Present- Cold Intolerance, Excessive Hunger, Heat Intolerance, Hot flashes and New Diabetes. Hematology Present- Easy Bruising. Not Present- Excessive bleeding, Gland problems, HIV and Persistent Infections.  Vitals  Weight: 178.2 lb Height: 64in Body Surface Area: 1.91 m Body Mass Index: 30.59 kg/m Temp.: 98.21F(Oral)  Pulse: 88 (Regular)  BP: 178/80 (Sitting, Left Arm, Standard)     Physical Exam   The physical exam findings are as follows: Note:WDWN in NAD HEENT: EOMI, sclera anicteric Neck: No masses, no thyromegaly Lungs: CTA bilaterally; normal respiratory effort Breasts:  symmetric; firm palpable mass just lateral to the left areola; smooth, lobulated, mobile no right breast masses CV: Regular rate and rhythm; no murmurs Abd: +bowel sounds, soft, non-tender, no masses Ext: Well-perfused; no edema Skin: Warm, dry; no sign of jaundice    Assessment & Plan  LARGE MASS OF LEFT BREAST (611.72  N63)  Current Plans Schedule for Surgery - left breast lumpectomy. The surgical procedure has been discussed with the patient. Potential risks, benefits, alternative treatments, and expected outcomes have been explained. All of the patient's questions at this time have been answered. The likelihood of reaching the patient's treatment goal is good. The patient understand the proposed surgical procedure and wishes to proceed.   Imogene Burn. Georgette Dover, MD, [facility]  General/ Trauma Surgery  10/06/2015 7:14 AM

## 2015-10-06 NOTE — Transfer of Care (Signed)
Immediate Anesthesia Transfer of Care Note  Patient: [name]  Procedure(s) Performed: Procedure(s): LEFT BREAST LUMPECTOMY (Left)  Patient Location: PACU  Anesthesia Type:General  Level of Consciousness: awake, alert  and oriented  Airway & Oxygen Therapy: Patient Spontanous Breathing  Post-op Assessment: Report given to RN and Post -op Vital signs reviewed and stable  Post vital signs: Reviewed and stable  Last Vitals:  Filed Vitals:   10/06/15 0617  BP: 162/60  Pulse: 63  Temp: 36.9 C  Resp: 18    Complications: No apparent anesthesia complications

## 2015-10-07 ENCOUNTER — Encounter (HOSPITAL_COMMUNITY): Payer: Self-pay | Admitting: Surgery

## 2015-10-14 ENCOUNTER — Ambulatory Visit (INDEPENDENT_AMBULATORY_CARE_PROVIDER_SITE_OTHER): Payer: Medicare Other | Admitting: Podiatry

## 2015-10-14 ENCOUNTER — Encounter: Payer: Self-pay | Admitting: Podiatry

## 2015-10-14 DIAGNOSIS — M79674 Pain in right toe(s): Secondary | ICD-10-CM

## 2015-10-14 DIAGNOSIS — B351 Tinea unguium: Secondary | ICD-10-CM

## 2015-10-14 NOTE — Progress Notes (Signed)
Patient ID: [name], female   DOB: [birth year], 74 y.o.   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

## 2015-10-14 NOTE — Patient Instructions (Signed)
Diabetes and Foot Care Diabetes may cause you to have problems because of poor blood supply (circulation) to your feet and legs. This may cause the skin on your feet to become thinner, break easier, and heal more slowly. Your skin may become dry, and the skin may peel and crack. You may also have nerve damage in your legs and feet causing decreased feeling in them. You may not notice minor injuries to your feet that could lead to infections or more serious problems. Taking care of your feet is one of the most important things you can do for yourself.  HOME CARE INSTRUCTIONS  Wear shoes at all times, even in the house. Do not go barefoot. Bare feet are easily injured.  Check your feet daily for blisters, cuts, and redness. If you cannot see the bottom of your feet, use a mirror or ask someone for help.  Wash your feet with warm water (do not use hot water) and mild soap. Then pat your feet and the areas between your toes until they are completely dry. Do not soak your feet as this can dry your skin.  Apply a moisturizing lotion or petroleum jelly (that does not contain alcohol and is unscented) to the skin on your feet and to dry, brittle toenails. Do not apply lotion between your toes.  Trim your toenails straight across. Do not dig under them or around the cuticle. File the edges of your nails with an emery board or nail file.  Do not cut corns or calluses or try to remove them with medicine.  Wear clean socks or stockings every day. Make sure they are not too tight. Do not wear knee-high stockings since they may decrease blood flow to your legs.  Wear shoes that fit properly and have enough cushioning. To break in new shoes, wear them for just a few hours a day. This prevents you from injuring your feet. Always look in your shoes before you put them on to be sure there are no objects inside.  Do not cross your legs. This may decrease the blood flow to your feet.  If you find a minor scrape,  cut, or break in the skin on your feet, keep it and the skin around it clean and dry. These areas may be cleansed with mild soap and water. Do not cleanse the area with peroxide, alcohol, or iodine.  When you remove an adhesive bandage, be sure not to damage the skin around it.  If you have a wound, look at it several times a day to make sure it is healing.  Do not use heating pads or hot water bottles. They may burn your skin. If you have lost feeling in your feet or legs, you may not know it is happening until it is too late.  Make sure your health care provider performs a complete foot exam at least annually or more often if you have foot problems. Report any cuts, sores, or bruises to your health care provider immediately. SEEK MEDICAL CARE IF:   You have an injury that is not healing.  You have cuts or breaks in the skin.  You have an ingrown nail.  You notice redness on your legs or feet.  You feel burning or tingling in your legs or feet.  You have pain or cramps in your legs and feet.  Your legs or feet are numb.  Your feet always feel cold. SEEK IMMEDIATE MEDICAL CARE IF:   There is increasing redness,   swelling, or pain in or around a wound.  There is a red line that goes up your leg.  Pus is coming from a wound.  You develop a fever or as directed by your health care provider.  You notice a bad smell coming from an ulcer or wound.   This information is not intended to replace advice given to you by your health care provider. Make sure you discuss any questions you have with your health care provider.   Document Released: 11/11/2000 Document Revised: 07/17/2013 Document Reviewed: 04/23/2013 Elsevier Interactive Patient Education 2016 [employer]  

## 2015-11-05 ENCOUNTER — Encounter: Payer: Self-pay | Admitting: Gastroenterology

## 2015-11-06 ENCOUNTER — Ambulatory Visit: Payer: Medicare Other

## 2015-11-06 ENCOUNTER — Telehealth: Payer: Self-pay | Admitting: Hematology and Oncology

## 2015-11-06 ENCOUNTER — Other Ambulatory Visit (HOSPITAL_BASED_OUTPATIENT_CLINIC_OR_DEPARTMENT_OTHER): Payer: Medicare Other

## 2015-11-06 ENCOUNTER — Ambulatory Visit (HOSPITAL_BASED_OUTPATIENT_CLINIC_OR_DEPARTMENT_OTHER): Payer: Medicare Other | Admitting: Hematology and Oncology

## 2015-11-06 ENCOUNTER — Encounter: Payer: Self-pay | Admitting: Hematology and Oncology

## 2015-11-06 VITALS — BP 129/53 | HR 68 | Temp 98.1°F | Resp 18 | Ht 64.0 in | Wt 176.2 lb

## 2015-11-06 DIAGNOSIS — D509 Iron deficiency anemia, unspecified: Secondary | ICD-10-CM

## 2015-11-06 DIAGNOSIS — D649 Anemia, unspecified: Secondary | ICD-10-CM

## 2015-11-06 DIAGNOSIS — D539 Nutritional anemia, unspecified: Secondary | ICD-10-CM

## 2015-11-06 DIAGNOSIS — D638 Anemia in other chronic diseases classified elsewhere: Secondary | ICD-10-CM

## 2015-11-06 HISTORY — DX: Anemia in other chronic diseases classified elsewhere: D63.8

## 2015-11-06 LAB — CBC & DIFF AND RETIC
BASO%: 0.3 % (ref 0.0–2.0)
BASOS ABS: 0 10*3/uL (ref 0.0–0.1)
EOS%: 3.5 % (ref 0.0–7.0)
Eosinophils Absolute: 0.3 10*3/uL (ref 0.0–0.5)
HEMATOCRIT: 35.2 % (ref 34.8–46.6)
HGB: 11.1 g/dL — ABNORMAL LOW (ref 11.6–15.9)
Immature Retic Fract: 11.9 % — ABNORMAL HIGH (ref 1.60–10.00)
LYMPH%: 17.8 % (ref 14.0–49.7)
MCH: 30.1 pg (ref 25.1–34.0)
MCHC: 31.5 g/dL (ref 31.5–36.0)
MCV: 95.4 fL (ref 79.5–101.0)
MONO#: 0.8 10*3/uL (ref 0.1–0.9)
MONO%: 10 % (ref 0.0–14.0)
NEUT#: 5.2 10*3/uL (ref 1.5–6.5)
NEUT%: 68.4 % (ref 38.4–76.8)
Platelets: 190 10*3/uL (ref 145–400)
RBC: 3.69 10*6/uL — ABNORMAL LOW (ref 3.70–5.45)
RDW: 13.9 % (ref 11.2–14.5)
RETIC %: 4.39 % — AB (ref 0.70–2.10)
Retic Ct Abs: 161.99 10*3/uL — ABNORMAL HIGH (ref 33.70–90.70)
WBC: 7.5 10*3/uL (ref 3.9–10.3)
lymph#: 1.3 10*3/uL (ref 0.9–3.3)

## 2015-11-06 NOTE — Telephone Encounter (Signed)
per pof to sch pt appt-gave pt copy of avs °

## 2015-11-06 NOTE — Progress Notes (Signed)
[place] OFFICE PROGRESS NOTE  KILPATRICK JR,[name] R, MD SUMMARY OF HEMATOLOGIC HISTORY:  She was found to have abnormal CBC from routine blood work. In 2012, she has normal hemoglobin. Since November 2012 until present time, she has recurrence of anemia requiring blood transfusion, with her blood counts as close to 6.2. She was placed on oral iron supplement for over a year. She also carries a diagnosis of autoimmune psoriatic arthritis and was placed on prednisone in the past. She denies recent chest pain on exertion, pre-syncopal episodes, or palpitations. She has mild weakness since the recent heart attack and shortness of breath on minimal exertion. Her last colonoscopy was several months ago in 2015 with virtual colonoscopy for positive fecal occult blood. Prior endoscopy in June 2011 revealed significant esophagitis. It was repeated again in April 2015 which was unremarkable.  The patient was prescribed oral iron supplements and she takes one tablet twice a day with breakfast and dinner. It is causing some abdominal bloating and constipation. In October 2015, she was given dose of intravenous iron Feraheme without improvement of anemia. On 10/28/2014, she was started on Darbopoeitin 200 g every 3 weeks. On 12/30/2014, dose of darbepoetin was increased to 300 g every 2 weeks. In February and March 2016, she received IV iron INTERVAL HISTORY: [name] 74 y.o. female returns for  Further follow-up. Recently, she underwent extensive cardiac evaluation and lumpectomy. The lumpectomy specimen show no evidence of cancer. She continue oral iron supplement daily. She has not received Aranesp since August 2016. Her energy level is fair. The patient denies any recent signs or symptoms of bleeding such as spontaneous epistaxis, hematuria or hematochezia.   I have reviewed the past medical history, past surgical history, social history and family history with the  patient and they are unchanged from previous note.  ALLERGIES:  is allergic to amlodipine besy-benazepril hcl; statins; tradjenta; plavix; and raptiva.  MEDICATIONS:  Current Outpatient Prescriptions  Medication Sig Dispense Refill  . ADVAIR HFA 115-21 MCG/ACT inhaler Inhale 2 puffs into the lungs daily.    Marland Kitchen albuterol (PROVENTIL HFA;VENTOLIN HFA) 108 (90 BASE) MCG/ACT inhaler Inhale 1 puff into the lungs every 6 (six) hours as needed for shortness of breath.     . ALPRAZolam (XANAX) 0.5 MG tablet Take 0.25 mg by mouth 2 (two) times daily as needed for anxiety or sleep.    Marland Kitchen ammonium lactate (LAC-HYDRIN) 12 % lotion Apply 1 application topically daily as needed for dry skin (psorias).     . Artificial Tear Ointment (LUBRICANT EYE OP) Place 1 drop into both eyes as needed (for dry eyes).    Marland Kitchen aspirin 81 MG tablet Take 81 mg by mouth daily.    . carvedilol (COREG) 3.125 MG tablet Take 1 tablet by mouth 2  (two) times daily with   meals 180 tablet 3  . Cholecalciferol (VITAMIN D) 2000 UNITS CAPS Take 2,000 Units by mouth every morning.     . dorzolamide (TRUSOPT) 2 % ophthalmic solution Place 1 drop into the right eye 2 (two) times daily.    Marland Kitchen esomeprazole (NEXIUM) 40 MG capsule Take 40 mg by mouth daily before breakfast.    . feeding supplement, GLUCERNA SHAKE, (GLUCERNA SHAKE) LIQD Take 237 mLs by mouth daily at 3 pm.     . ferrous sulfate 325 (65 FE) MG tablet Take 1 tablet (325 mg total) by mouth 2 (two) times daily with a meal. 60 tablet 2  . fluticasone (FLONASE)  50 MCG/ACT nasal spray Place 1 spray into both nostrils at bedtime as needed for allergies.     . furosemide (LASIX) 40 MG tablet Take 1 tablet (40 mg total) by mouth daily. 30 tablet   . glimepiride (AMARYL) 2 MG tablet Take 2 mg by mouth every morning.     Marland Kitchen HYDROcodone-acetaminophen (NORCO/VICODIN) 5-325 MG tablet Take 1 tablet by mouth every 4 (four) hours as needed. 40 tablet 0  . ipratropium-albuterol (DUONEB) 0.5-2.5 (3)  MG/3ML SOLN Take 3 mLs by nebulization every 4 (four) hours as needed (shortness of breath).    . Liniments ([employer] ARTHRITIS PAIN RELIEVER EX) Apply 1 application topically 2 (two) times daily as needed (for shoulder pains, knee, back.).     Marland Kitchen metFORMIN (GLUCOPHAGE) 500 MG tablet Take 500 mg by mouth daily after supper. Pt. Told that she is  only to take only as needed , for a blood sugar of 250 or greater    . Multiple Vitamins-Minerals (ICAPS) CAPS Take 1 capsule by mouth every morning.     . ONE TOUCH ULTRA TEST test strip 1 each by Other route See admin instructions. Check blood sugar twice daily    . pravastatin (PRAVACHOL) 10 MG tablet Take 10 mg by mouth 2 (two) times a week. Monday  and Thursday    . predniSONE (DELTASONE) 5 MG tablet Take 5 mg by mouth every Monday, Wednesday, and Friday.     . ranitidine (ZANTAC) 150 MG capsule Take 150 mg by mouth at bedtime.     . simethicone (MYLICON) 80 MG chewable tablet Chew 1 tablet (80 mg total) by mouth 4 (four) times daily as needed for flatulence. 30 tablet 0  . Travoprost, BAK Free, (TRAVATAMN) 0.004 % SOLN ophthalmic solution Place 1 drop into both eyes at bedtime.      No current facility-administered medications for this visit.     REVIEW OF SYSTEMS:   Constitutional: Denies fevers, chills or night sweats Eyes: Denies blurriness of vision Ears, nose, mouth, throat, and face: Denies mucositis or sore throat Respiratory: Denies cough, dyspnea or wheezes Cardiovascular: Denies palpitation, chest discomfort or lower extremity swelling Gastrointestinal:  Denies nausea, heartburn or change in bowel habits Skin: Denies abnormal skin rashes Lymphatics: Denies new lymphadenopathy or easy bruising Neurological:Denies numbness, tingling or new weaknesses Behavioral/Psych: Mood is stable, no new changes  All other systems were reviewed with the patient and are negative.  PHYSICAL EXAMINATION: ECOG PERFORMANCE STATUS: 1 - Symptomatic but  completely ambulatory  Filed Vitals:   11/06/15 1009  BP: 129/53  Pulse: 68  Temp: 98.1 F (36.7 C)  Resp: 18   Filed Weights   11/06/15 1009  Weight: 176 lb 3.2 oz (79.924 kg)    GENERAL:alert, no distress and comfortable SKIN: skin color, texture, turgor are normal, no rashes or significant lesions EYES: normal, Conjunctiva are pink and non-injected, sclera clear Musculoskeletal:no cyanosis of digits and no clubbing  NEURO: alert & oriented x 3 with fluent speech, no focal motor/sensory deficits  LABORATORY DATA:  I have reviewed the data as listed Results for orders placed or performed in visit on 11/06/15 (from the past 48 hour(s))  CBC & Diff and Retic     Status: Abnormal   Collection Time: 11/06/15  9:53 AM  Result Value Ref Range   WBC 7.5 3.9 - 10.3 10e3/uL   NEUT# 5.2 1.5 - 6.5 10e3/uL   HGB 11.1 (L) 11.6 - 15.9 g/dL   HCT 35.2 34.8 - 46.6 %  Platelets 190 145 - 400 10e3/uL   MCV 95.4 79.5 - 101.0 fL   MCH 30.1 25.1 - 34.0 pg   MCHC 31.5 31.5 - 36.0 g/dL   RBC 3.69 (L) 3.70 - 5.45 10e6/uL   RDW 13.9 11.2 - 14.5 %   lymph# 1.3 0.9 - 3.3 10e3/uL   MONO# 0.8 0.1 - 0.9 10e3/uL   Eosinophils Absolute 0.3 0.0 - 0.5 10e3/uL   Basophils Absolute 0.0 0.0 - 0.1 10e3/uL   NEUT% 68.4 38.4 - 76.8 %   LYMPH% 17.8 14.0 - 49.7 %   MONO% 10.0 0.0 - 14.0 %   EOS% 3.5 0.0 - 7.0 %   BASO% 0.3 0.0 - 2.0 %   Retic % 4.39 (H) 0.70 - 2.10 %   Retic Ct Abs 161.99 (H) 33.70 - 90.70 10e3/uL   Immature Retic Fract 11.90 (H) 1.60 - 10.00 %    Lab Results  Component Value Date   WBC 7.5 11/06/2015   HGB 11.1* 11/06/2015   HCT 35.2 11/06/2015   MCV 95.4 11/06/2015   PLT 190 11/06/2015    ASSESSMENT & PLAN:  Deficiency anemia I believe the cause of her anemia is multifactorial, likely component of anemia chronic disease and iron deficiency. After intravenous iron infusion, she has improved. I recommend the use of erythropoeitin stimulating agents. We discussed the risks,  benefits, side effects of erythropoietin stimulating agents for anemia, with the goal of keeping the hemoglobin greater than 11 g. She will continue oral iron supplement daily.  she responded very well to treatment and her hemoglobin has been consistently above 11 g over the last few months. I would discontinue Aranesp injection and continue monitoring.  Iron deficiency anemia  The patient has history of GI bleed and AVMs. She will continue at least one oral iron supplement indefinitely.   All questions were answered. The patient knows to call the clinic with any problems, questions or concerns. No barriers to learning was detected.  I spent 15 minutes counseling the patient face to face. The total time spent in the appointment was 20 minutes and more than 50% was on counseling.     Beverlee Wilmarth, MD 12/9/201610:58 AM

## 2015-11-06 NOTE — Assessment & Plan Note (Signed)
The patient has history of GI bleed and AVMs. She will continue at least one oral iron supplement indefinitely.

## 2015-11-06 NOTE — Assessment & Plan Note (Signed)
I believe the cause of her anemia is multifactorial, likely component of anemia chronic disease and iron deficiency. After intravenous iron infusion, she has improved. I recommend the use of erythropoeitin stimulating agents. We discussed the risks, benefits, side effects of erythropoietin stimulating agents for anemia, with the goal of keeping the hemoglobin greater than 11 g. She will continue oral iron supplement daily.  she responded very well to treatment and her hemoglobin has been consistently above 11 g over the last few months. I would discontinue Aranesp injection and continue monitoring.

## 2015-11-17 ENCOUNTER — Encounter: Payer: Self-pay | Admitting: Cardiovascular Disease

## 2015-11-17 ENCOUNTER — Ambulatory Visit (INDEPENDENT_AMBULATORY_CARE_PROVIDER_SITE_OTHER): Payer: Medicare Other | Admitting: Cardiovascular Disease

## 2015-11-17 VITALS — BP 130/58 | HR 76 | Ht 64.0 in | Wt 172.0 lb

## 2015-11-17 DIAGNOSIS — Z9861 Coronary angioplasty status: Secondary | ICD-10-CM

## 2015-11-17 DIAGNOSIS — I10 Essential (primary) hypertension: Secondary | ICD-10-CM

## 2015-11-17 DIAGNOSIS — I2510 Atherosclerotic heart disease of native coronary artery without angina pectoris: Secondary | ICD-10-CM | POA: Diagnosis not present

## 2015-11-17 DIAGNOSIS — I779 Disorder of arteries and arterioles, unspecified: Secondary | ICD-10-CM

## 2015-11-17 DIAGNOSIS — I739 Peripheral vascular disease, unspecified: Secondary | ICD-10-CM

## 2015-11-17 DIAGNOSIS — E785 Hyperlipidemia, unspecified: Secondary | ICD-10-CM | POA: Diagnosis not present

## 2015-11-17 NOTE — Patient Instructions (Signed)
Medication Instructions:   NO CHANGE  Labwork:  Your physician recommends that you return for lab work PRIOR TO EATING  Follow-Up:  Your physician wants you to follow-up in: 6 MONTHS WITH DR BERRY You will receive a reminder letter in the mail two months in advance. If you don't receive a letter, please call our office to schedule the follow-up appointment.   If you need a refill on your cardiac medications before your next appointment, please call your pharmacy.    

## 2015-11-17 NOTE — Assessment & Plan Note (Signed)
History of hyperlipidemia on pravastatin followed by her PCP. We will recheck a lipid and liver profile

## 2015-11-17 NOTE — Assessment & Plan Note (Signed)
History of hypertension blood pressure measured at 170/80 which is high for her. She is on carvedilol. Continue current meds at current dosing

## 2015-11-17 NOTE — Assessment & Plan Note (Signed)
History of CAD status post high-speed rotational atherectomy, PCI and drug-eluting stenting of her proximal LAD with hemodynamic support using Impala by Dr. Burt Knack 04/16/14. She had an excellent result. Her EF actually improved from 35-65%. Because of an abnormal Myoview done for preoperative clearance 08/28/15 I recatheterized her 09/21/15 revealing an EF of 45-50%, widely patent proximal LAD stent, 60% left main and 60% distal RCA stenosis. She denies chest pain or shortness of breath.

## 2015-11-17 NOTE — Assessment & Plan Note (Signed)
History of moderate left ICA stenosis by duplex ultrasound 01/07/15. She is neurologically asymptomatic.Following this noninvasively on annual basis.

## 2015-11-17 NOTE — Progress Notes (Signed)
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 I last saw her in the office 09/16/15. 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 I last saw her in the office 06/10/14. She saw Tenny Craw PA-C 08/01/14. She was hospitalized 04/05/2014 secondary to acute respiratory failure requiring intubation. During the hospitalization her peak troponin was > 20 she had pulmonary edema requiring IV diuresing. Echo was done EF is 35-40% she did undergo left heart cath 04/09/2014 with three-vessel disease involving proximal and mid LAD, severe distal left main, ostial circumflex and RCA. PCI was not feasible due to coronary anatomy. T CTS evaluated her for bypass grafting but she was found to be a poor surgical candidate. PCI was felt to be the safest intervention at that point. 04/16/2014 she underwent Impala insertion IVUS, rotational atherectomy of the LAD, angioplasty and stenting of the proximal and mid LAD with Xience Alpine drug-eluting stent. Postop was complicated by hypotension, cardiogenic shock requiring dopamine drip. She also had acute blood loss anemia requiring 2 units packed RBCs. The plan is to use Brilinta alone for her new stent for 12 months and then change to aspirin alone daily. She was mobilized by cardiac rehabilitation, continued to to improve and was finally discharged home 04/21/2014. I saw Pt for follow up on 04/28/14.  She had not started her Iron at that time. She had also complained of some dark stools. Labs were ordered and her H/H Had decreased from 9.4 to 8.8. Arrangements were made for her to see GI which she did and was then  admitted for transfusion of 2 units of packed cells on 05/09/2014. Plans are also new for virtual colonoscopy with camera.  Since her procedure she has done well except for blood loss anemia of unclear etiology despite aggressive GI evaluation. Initially I had recommended that she stop her aspirin however since she has a drug-eluting stent in LAD and is on Brilenta I suggested that she restart her aspirin with careful followup of her hemoglobin. She has gotten iron infusions since I last saw her and is followed by hematologist as well as a gastroenterologist. A 2-D echocardiogram performed on 11/07/14 revealed marked improvement in her ejection fraction from 35-65%. She continues to have a moderate pericardial effusion which has remained stable without evidence of pericardial tamponade by recent 2-D echocardiogram performed 05/13/15. Her EF was 45-50%. . She denies chest pain or shortness of breath. 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 These were addressed with laser by Dr. Henrene Pastor. She gets occasional chest pain which she attributed to the digestion. She apparently needs a small left breast mass removed under local anesthesia. We obtained a 2-D echo which revealed a decline in her LV function from the 45-50% down to 35% range As well as a Myoview stress test performed 08/28/15 which showed inferolateral scar with moderate peri-infarct ischemia. Based on this, I performed cardiac catheterization on her 09/21/15 via the right radial approach revealing a widely patent LAD stent, 60% left main and 40% distal RCA with an EF of 45-50%. She is asymptomatic. She also had her left breast  mass successfully excised. This was found to be not neoplastic.   Current Outpatient Prescriptions  Medication Sig Dispense Refill  . ADVAIR HFA 115-21 MCG/ACT inhaler Inhale 2 puffs into the lungs daily.    Marland Kitchen albuterol (PROVENTIL  HFA;VENTOLIN HFA) 108 (90 BASE) MCG/ACT inhaler Inhale 1 puff into the lungs every 6 (six) hours as needed for shortness of breath.     . ALPRAZolam (XANAX) 0.5 MG tablet Take 0.25 mg by mouth 2 (two) times daily as needed for anxiety or sleep.    Marland Kitchen ammonium lactate (LAC-HYDRIN) 12 % lotion Apply 1 application topically daily as needed for dry skin (psorias).     . Artificial Tear Ointment (LUBRICANT EYE OP) Place 1 drop into both eyes as needed (for dry eyes).    Marland Kitchen aspirin 81 MG tablet Take 81 mg by mouth daily.    . carvedilol (COREG) 3.125 MG tablet Take 1 tablet by mouth 2  (two) times daily with   meals 180 tablet 3  . Cholecalciferol (VITAMIN D) 2000 UNITS CAPS Take 2,000 Units by mouth every morning.     . dorzolamide (TRUSOPT) 2 % ophthalmic solution Place 1 drop into the right eye 2 (two) times daily.    Marland Kitchen esomeprazole (NEXIUM) 40 MG capsule Take 40 mg by mouth daily before breakfast.    . feeding supplement, GLUCERNA SHAKE, (GLUCERNA SHAKE) LIQD Take 237 mLs by mouth daily at 3 pm.     . ferrous sulfate 325 (65 FE) MG tablet Take 1 tablet (325 mg total) by mouth 2 (two) times daily with a meal. 60 tablet 2  . fluticasone (FLONASE) 50 MCG/ACT nasal spray Place 1 spray into both nostrils at bedtime as needed for allergies.     . furosemide (LASIX) 40 MG tablet Take 1 tablet (40 mg total) by mouth daily. 30 tablet   . glimepiride (AMARYL) 2 MG tablet Take 2 mg by mouth every morning.     Marland Kitchen HYDROcodone-acetaminophen (NORCO/VICODIN) 5-325 MG tablet Take 1 tablet by mouth every 4 (four) hours as needed. 40 tablet 0  . ipratropium-albuterol (DUONEB) 0.5-2.5 (3) MG/3ML SOLN Take 3 mLs by nebulization every 4 (four) hours as needed (shortness of breath).    . Liniments ([employer] ARTHRITIS PAIN RELIEVER EX) Apply 1 application topically 2 (two) times daily as needed (for shoulder pains, knee, back.).     Marland Kitchen metFORMIN (GLUCOPHAGE) 500 MG tablet Take 500 mg by mouth daily after supper. Pt. Told that she is   only to take only as needed , for a blood sugar of 250 or greater    . Multiple Vitamins-Minerals (ICAPS) CAPS Take 1 capsule by mouth every morning.     . ONE TOUCH ULTRA TEST test strip 1 each by Other route See admin instructions. Check blood sugar twice daily    . pravastatin (PRAVACHOL) 10 MG tablet Take 10 mg by mouth 2 (two) times a week. Monday  and Thursday    . predniSONE (DELTASONE) 5 MG tablet Take 5 mg by mouth every Monday, Wednesday, and Friday.     . ranitidine (ZANTAC) 150 MG capsule Take 150 mg by mouth at bedtime.     . simethicone (MYLICON) 80 MG chewable tablet Chew 1 tablet (80 mg total) by mouth 4 (four) times daily as needed for flatulence. 30 tablet 0  . Travoprost, BAK Free, (TRAVATAMN) 0.004 % SOLN ophthalmic solution Place 1 drop into both eyes at bedtime.      No current facility-administered medications for  this visit.    Allergies  Allergen Reactions  . Amlodipine Besy-Benazepril Hcl Other (See Comments)    Nervous/shakiness  . Statins Other (See Comments)    "can't take any of them; cramp me up" (12/06/2012)  . Tradjenta [Linagliptin] Other (See Comments)    Extreme muscle pains, chest and back pains  . Plavix [Clopidogrel] Rash and Other (See Comments)    Severe burning of the skin.Pt states that the doctor told her that her rash was psoriasis and not related to plavix but she states the rash came from the plavix  . Raptiva [Efalizumab] Rash    Social History   Social History  . Marital Status: Widowed    Spouse Name: N/A  . Number of Children: 0  . Years of Education: N/A   Occupational History  . 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 Smoking status: Former Smoker -- 1.00 packs/day for 40 years    Types: Cigarettes  . Smokeless tobacco: Former Systems developer    Quit date: 09/28/2013     Comment: pt states that she smokes "every once in a while"  . Alcohol Use: No     Comment: "quit social drinking years ago"  .  Drug Use: No  . Sexual Activity: No   Other Topics Concern  . Not on file   Social History Narrative     Review of Systems: General: negative for chills, fever, night sweats or weight changes.  Cardiovascular: negative for chest pain, dyspnea on exertion, edema, orthopnea, palpitations, paroxysmal nocturnal dyspnea or shortness of breath Dermatological: negative for rash Respiratory: negative for cough or wheezing Urologic: negative for hematuria Abdominal: negative for nausea, vomiting, diarrhea, bright red blood per rectum, melena, or hematemesis Neurologic: negative for visual changes, syncope, or dizziness All other systems reviewed and are otherwise negative except as noted above.    Blood pressure 130/58, pulse 76, height 5\' 4"  (1.626 m), weight 172 lb (78.019 kg).  General appearance: alert and no distress Neck: no adenopathy, no JVD, supple, symmetrical, trachea midline, thyroid not enlarged, symmetric, no tenderness/mass/nodules and soft left carotid bruit Lungs: clear to auscultation bilaterally Heart: regular rate and rhythm, S1, S2 normal, no murmur, click, rub or gallop Extremities: extremities normal, atraumatic, no cyanosis or edema  EKG not performed today  ASSESSMENT AND PLAN:   Hyperlipidemia with target LDL less than 70 History of hyperlipidemia on pravastatin followed by her PCP. We will recheck a lipid and liver profile  Essential hypertension History of hypertension blood pressure measured at 170/80 which is high for her. She is on carvedilol. Continue current meds at current dosing  CAD S/P percutaneous coronary angioplasty History of CAD status post high-speed rotational atherectomy, PCI and drug-eluting stenting of her proximal LAD with hemodynamic support using Impala by Dr. Burt Knack 04/16/14. She had an excellent result. Her EF actually improved from 35-65%. Because of an abnormal Myoview done for preoperative clearance 08/28/15 I recatheterized her  09/21/15 revealing an EF of 45-50%, widely patent proximal LAD stent, 60% left main and 60% distal RCA stenosis. She denies chest pain or shortness of breath.  Left-sided carotid artery disease History of moderate left ICA stenosis by duplex ultrasound 01/07/15. She is neurologically asymptomatic.Following this noninvasively on annual basis.      Lorretta Harp MD FACP,FACC,FAHA, [facility] 11/17/2015 11:23 AM

## 2015-12-03 LAB — LIPID PANEL
CHOLESTEROL: 196 mg/dL (ref 125–200)
HDL: 50 mg/dL (ref >=46)
LDL Cholesterol: 110 mg/dL (ref <130)
TRIGLYCERIDES: 179 mg/dL — AB (ref <150)
Total CHOL/HDL Ratio: 3.9 Ratio (ref <=5.0)
VLDL: 36 mg/dL — ABNORMAL HIGH (ref <30)

## 2015-12-03 LAB — HEPATIC FUNCTION PANEL
ALBUMIN: 3.7 g/dL (ref 3.6–5.1)
ALK PHOS: 81 U/L (ref 33–130)
ALT: 14 U/L (ref 6–29)
AST: 18 U/L (ref 10–35)
BILIRUBIN TOTAL: 0.3 mg/dL (ref 0.2–1.2)
Bilirubin, Direct: 0.1 mg/dL (ref <=0.2)
Indirect Bilirubin: 0.2 mg/dL (ref 0.2–1.2)
Total Protein: 6.5 g/dL (ref 6.1–8.1)

## 2016-01-05 ENCOUNTER — Other Ambulatory Visit: Payer: Self-pay | Admitting: Cardiovascular Disease

## 2016-01-05 DIAGNOSIS — I6523 Occlusion and stenosis of bilateral carotid arteries: Secondary | ICD-10-CM

## 2016-01-12 ENCOUNTER — Ambulatory Visit (HOSPITAL_COMMUNITY)
Admission: RE | Admit: 2016-01-12 | Discharge: 2016-01-12 | Disposition: A | Discharge status: Home / Self Care | Payer: Medicare Other | Source: Ambulatory Visit | Attending: Cardiology | Admitting: Cardiology

## 2016-01-12 DIAGNOSIS — I2510 Atherosclerotic heart disease of native coronary artery without angina pectoris: Secondary | ICD-10-CM | POA: Diagnosis not present

## 2016-01-12 DIAGNOSIS — Z87891 Personal history of nicotine dependence: Secondary | ICD-10-CM | POA: Insufficient documentation

## 2016-01-12 DIAGNOSIS — I6523 Occlusion and stenosis of bilateral carotid arteries: Secondary | ICD-10-CM

## 2016-01-12 DIAGNOSIS — E119 Type 2 diabetes mellitus without complications: Secondary | ICD-10-CM | POA: Insufficient documentation

## 2016-01-12 DIAGNOSIS — E785 Hyperlipidemia, unspecified: Secondary | ICD-10-CM | POA: Insufficient documentation

## 2016-01-12 DIAGNOSIS — I10 Essential (primary) hypertension: Secondary | ICD-10-CM | POA: Insufficient documentation

## 2016-01-14 ENCOUNTER — Telehealth: Payer: Self-pay

## 2016-01-14 DIAGNOSIS — I10 Essential (primary) hypertension: Secondary | ICD-10-CM

## 2016-01-14 NOTE — Telephone Encounter (Signed)
-----   Message from Lorretta Harp, MD sent at 01/13/2016  8:15 PM EST ----- No change from prior study. Repeat in 12 months.

## 2016-01-20 ENCOUNTER — Encounter: Payer: Self-pay | Admitting: Podiatry

## 2016-01-20 ENCOUNTER — Ambulatory Visit (INDEPENDENT_AMBULATORY_CARE_PROVIDER_SITE_OTHER): Payer: Medicare Other | Admitting: Podiatry

## 2016-01-20 DIAGNOSIS — B351 Tinea unguium: Secondary | ICD-10-CM

## 2016-01-20 DIAGNOSIS — M79674 Pain in right toe(s): Secondary | ICD-10-CM | POA: Diagnosis not present

## 2016-01-20 NOTE — Progress Notes (Signed)
Patient ID: [name], female   DOB: 13-Aug-1941, 75 y.o.   MRN: [id number]    Expand All Collapse All   Patient ID: [name], female DOB: 12/16/1940, 75 y.o. 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

## 2016-01-20 NOTE — Patient Instructions (Signed)
Diabetes and Foot Care Diabetes may cause you to have problems because of poor blood supply (circulation) to your feet and legs. This may cause the skin on your feet to become thinner, break easier, and heal more slowly. Your skin may become dry, and the skin may peel and crack. You may also have nerve damage in your legs and feet causing decreased feeling in them. You may not notice minor injuries to your feet that could lead to infections or more serious problems. Taking care of your feet is one of the most important things you can do for yourself.  HOME CARE INSTRUCTIONS  Wear shoes at all times, even in the house. Do not go barefoot. Bare feet are easily injured.  Check your feet daily for blisters, cuts, and redness. If you cannot see the bottom of your feet, use a mirror or ask someone for help.  Wash your feet with warm water (do not use hot water) and mild soap. Then pat your feet and the areas between your toes until they are completely dry. Do not soak your feet as this can dry your skin.  Apply a moisturizing lotion or petroleum jelly (that does not contain alcohol and is unscented) to the skin on your feet and to dry, brittle toenails. Do not apply lotion between your toes.  Trim your toenails straight across. Do not dig under them or around the cuticle. File the edges of your nails with an emery board or nail file.  Do not cut corns or calluses or try to remove them with medicine.  Wear clean socks or stockings every day. Make sure they are not too tight. Do not wear knee-high stockings since they may decrease blood flow to your legs.  Wear shoes that fit properly and have enough cushioning. To break in new shoes, wear them for just a few hours a day. This prevents you from injuring your feet. Always look in your shoes before you put them on to be sure there are no objects inside.  Do not cross your legs. This may decrease the blood flow to your feet.  If you find a minor scrape,  cut, or break in the skin on your feet, keep it and the skin around it clean and dry. These areas may be cleansed with mild soap and water. Do not cleanse the area with peroxide, alcohol, or iodine.  When you remove an adhesive bandage, be sure not to damage the skin around it.  If you have a wound, look at it several times a day to make sure it is healing.  Do not use heating pads or hot water bottles. They may burn your skin. If you have lost feeling in your feet or legs, you may not know it is happening until it is too late.  Make sure your health care provider performs a complete foot exam at least annually or more often if you have foot problems. Report any cuts, sores, or bruises to your health care provider immediately. SEEK MEDICAL CARE IF:   You have an injury that is not healing.  You have cuts or breaks in the skin.  You have an ingrown nail.  You notice redness on your legs or feet.  You feel burning or tingling in your legs or feet.  You have pain or cramps in your legs and feet.  Your legs or feet are numb.  Your feet always feel cold. SEEK IMMEDIATE MEDICAL CARE IF:   There is increasing redness,   swelling, or pain in or around a wound.  There is a red line that goes up your leg.  Pus is coming from a wound.  You develop a fever or as directed by your health care provider.  You notice a bad smell coming from an ulcer or wound.   This information is not intended to replace advice given to you by your health care provider. Make sure you discuss any questions you have with your health care provider.   Document Released: 11/11/2000 Document Revised: 07/17/2013 Document Reviewed: 04/23/2013 Elsevier Interactive Patient Education 2016 [employer]  

## 2016-01-26 ENCOUNTER — Telehealth: Payer: Self-pay | Admitting: Cardiovascular Disease

## 2016-01-26 NOTE — Telephone Encounter (Signed)
Advised pt results normal, repeat in 1 year. Pt voiced understanding & thanks.

## 2016-01-26 NOTE — Telephone Encounter (Signed)
Pt would like her results from her doppler from 01-12-16 please.

## 2016-02-24 ENCOUNTER — Other Ambulatory Visit: Payer: Self-pay | Admitting: Hematology and Oncology

## 2016-02-26 ENCOUNTER — Ambulatory Visit (HOSPITAL_BASED_OUTPATIENT_CLINIC_OR_DEPARTMENT_OTHER): Payer: Medicare Other | Admitting: Hematology and Oncology

## 2016-02-26 ENCOUNTER — Other Ambulatory Visit (HOSPITAL_BASED_OUTPATIENT_CLINIC_OR_DEPARTMENT_OTHER): Payer: Medicare Other

## 2016-02-26 ENCOUNTER — Encounter: Payer: Self-pay | Admitting: Hematology and Oncology

## 2016-02-26 VITALS — BP 167/79 | HR 78 | Temp 98.0°F | Resp 18 | Ht 64.0 in | Wt 175.7 lb

## 2016-02-26 DIAGNOSIS — I10 Essential (primary) hypertension: Secondary | ICD-10-CM

## 2016-02-26 DIAGNOSIS — D539 Nutritional anemia, unspecified: Secondary | ICD-10-CM

## 2016-02-26 DIAGNOSIS — D649 Anemia, unspecified: Secondary | ICD-10-CM

## 2016-02-26 DIAGNOSIS — J309 Allergic rhinitis, unspecified: Secondary | ICD-10-CM | POA: Diagnosis not present

## 2016-02-26 DIAGNOSIS — D509 Iron deficiency anemia, unspecified: Secondary | ICD-10-CM

## 2016-02-26 DIAGNOSIS — D638 Anemia in other chronic diseases classified elsewhere: Secondary | ICD-10-CM

## 2016-02-26 LAB — CBC & DIFF AND RETIC
BASO%: 0.3 % (ref 0.0–2.0)
BASOS ABS: 0 10*3/uL (ref 0.0–0.1)
EOS ABS: 0.3 10*3/uL (ref 0.0–0.5)
EOS%: 4 % (ref 0.0–7.0)
HEMATOCRIT: 36.7 % (ref 34.8–46.6)
HEMOGLOBIN: 11.9 g/dL (ref 11.6–15.9)
Immature Retic Fract: 11.6 % — ABNORMAL HIGH (ref 1.60–10.00)
LYMPH%: 18 % (ref 14.0–49.7)
MCH: 30.7 pg (ref 25.1–34.0)
MCHC: 32.4 g/dL (ref 31.5–36.0)
MCV: 94.8 fL (ref 79.5–101.0)
MONO#: 0.7 10*3/uL (ref 0.1–0.9)
MONO%: 9.1 % (ref 0.0–14.0)
NEUT#: 5.1 10*3/uL (ref 1.5–6.5)
NEUT%: 68.6 % (ref 38.4–76.8)
PLATELETS: 164 10*3/uL (ref 145–400)
RBC: 3.87 10*6/uL (ref 3.70–5.45)
RDW: 13.7 % (ref 11.2–14.5)
Retic %: 3.98 % — ABNORMAL HIGH (ref 0.70–2.10)
Retic Ct Abs: 154.03 10*3/uL — ABNORMAL HIGH (ref 33.70–90.70)
WBC: 7.5 10*3/uL (ref 3.9–10.3)
lymph#: 1.3 10*3/uL (ref 0.9–3.3)

## 2016-02-26 NOTE — Assessment & Plan Note (Signed)
Since the last visit, she has not needed erythropoietin stimulating agents. Her anemia has resolved. I suspect the cause of anemia was multifactorial, a combination of anemia of chronic disease and iron deficiency anemia. I recommend she continues one tablets of oral iron supplement indefinitely. I will transition her care and blood work monitoring to her primary care provider. I will see her back in the future if her hemoglobin drifted less than 10 g.

## 2016-02-26 NOTE — Progress Notes (Signed)
[facility] OFFICE PROGRESS NOTE  KILPATRICK JR,[name] R, MD SUMMARY OF HEMATOLOGIC HISTORY:  She was found to have abnormal CBC from routine blood work. In 2012, she has normal hemoglobin. Since November 2012 until present time, she has recurrence of anemia requiring blood transfusion, with her blood counts as close to 6.2. She was placed on oral iron supplement for over a year. She also carries a diagnosis of autoimmune psoriatic arthritis and was placed on prednisone in the past. She denies recent chest pain on exertion, pre-syncopal episodes, or palpitations. She has mild weakness since the recent heart attack and shortness of breath on minimal exertion. Her last colonoscopy was several months ago in 2015 with virtual colonoscopy for positive fecal occult blood. Prior endoscopy in June 2011 revealed significant esophagitis. It was repeated again in April 2015 which was unremarkable.  The patient was prescribed oral iron supplements and she takes one tablet twice a day with breakfast and dinner. It is causing some abdominal bloating and constipation. In October 2015, she was given dose of intravenous iron Feraheme without improvement of anemia. On 10/28/2014, she was started on Darbopoeitin 200 g every 3 weeks. On 12/30/2014, dose of darbepoetin was increased to 300 g every 2 weeks. In February and March 2016, she received IV iron INTERVAL HISTORY: [name] 75 y.o. female returns for further follow-up. She feels well apart from just some nasal congestion and sinusitis. She denies recent fever or chills. She is compliant taking oral iron supplement. The patient denies any recent signs or symptoms of bleeding such as spontaneous epistaxis, hematuria or hematochezia.   I have reviewed the past medical history, past surgical history, social history and family history with the patient and they are unchanged from previous note.  ALLERGIES:  is allergic to amlodipine  besy-benazepril hcl; statins; tradjenta; plavix; and raptiva.  MEDICATIONS:  Current Outpatient Prescriptions  Medication Sig Dispense Refill  . ADVAIR HFA 115-21 MCG/ACT inhaler Inhale 2 puffs into the lungs daily.    Marland Kitchen albuterol (PROVENTIL HFA;VENTOLIN HFA) 108 (90 BASE) MCG/ACT inhaler Inhale 1 puff into the lungs every 6 (six) hours as needed for shortness of breath.     . ALPRAZolam (XANAX) 0.5 MG tablet Take 0.25 mg by mouth 2 (two) times daily as needed for anxiety or sleep.    Marland Kitchen ammonium lactate (LAC-HYDRIN) 12 % lotion Apply 1 application topically daily as needed for dry skin (psorias).     . Artificial Tear Ointment (LUBRICANT EYE OP) Place 1 drop into both eyes as needed (for dry eyes).    Marland Kitchen aspirin 81 MG tablet Take 81 mg by mouth daily.    . carvedilol (COREG) 3.125 MG tablet Take 1 tablet by mouth 2  (two) times daily with   meals 180 tablet 3  . Cholecalciferol (VITAMIN D) 2000 UNITS CAPS Take 2,000 Units by mouth every morning.     . dorzolamide (TRUSOPT) 2 % ophthalmic solution Place 1 drop into the right eye 2 (two) times daily.    Marland Kitchen esomeprazole (NEXIUM) 40 MG capsule Take 40 mg by mouth daily before breakfast.    . feeding supplement, GLUCERNA SHAKE, (GLUCERNA SHAKE) LIQD Take 237 mLs by mouth daily at 3 pm.     . ferrous sulfate 325 (65 FE) MG tablet Take 1 tablet (325 mg total) by mouth 2 (two) times daily with a meal. 60 tablet 2  . fluticasone (FLONASE) 50 MCG/ACT nasal spray Place 1 spray into both nostrils at bedtime  as needed for allergies.     . furosemide (LASIX) 40 MG tablet Take 1 tablet (40 mg total) by mouth daily. 30 tablet   . glimepiride (AMARYL) 2 MG tablet Take 2 mg by mouth every morning.     Marland Kitchen ipratropium-albuterol (DUONEB) 0.5-2.5 (3) MG/3ML SOLN Take 3 mLs by nebulization every 4 (four) hours as needed (shortness of breath).    . Liniments ([employer] ARTHRITIS PAIN RELIEVER EX) Apply 1 application topically 2 (two) times daily as needed (for shoulder pains,  knee, back.).     Marland Kitchen metFORMIN (GLUCOPHAGE) 500 MG tablet Take 500 mg by mouth daily after supper. Pt. Told that she is  only to take only as needed , for a blood sugar of 250 or greater    . Multiple Vitamins-Minerals (ICAPS) CAPS Take 1 capsule by mouth every morning.     . ONE TOUCH ULTRA TEST test strip 1 each by Other route See admin instructions. Check blood sugar twice daily    . pravastatin (PRAVACHOL) 10 MG tablet Take 10 mg by mouth 2 (two) times a week. Monday  and Thursday    . predniSONE (DELTASONE) 5 MG tablet Take 5 mg by mouth every Monday, Wednesday, and Friday.     . ranitidine (ZANTAC) 150 MG capsule Take 150 mg by mouth at bedtime.     . simethicone (MYLICON) 80 MG chewable tablet Chew 1 tablet (80 mg total) by mouth 4 (four) times daily as needed for flatulence. 30 tablet 0  . Travoprost, BAK Free, (TRAVATAMN) 0.004 % SOLN ophthalmic solution Place 1 drop into both eyes at bedtime.      No current facility-administered medications for this visit.     REVIEW OF SYSTEMS:   Constitutional: Denies fevers, chills or night sweats Eyes: Denies blurriness of vision Ears, nose, mouth, throat, and face: Denies mucositis or sore throat Respiratory: Denies cough, dyspnea or wheezes Cardiovascular: Denies palpitation, chest discomfort or lower extremity swelling Gastrointestinal:  Denies nausea, heartburn or change in bowel habits Skin: Denies abnormal skin rashes Lymphatics: Denies new lymphadenopathy or easy bruising Neurological:Denies numbness, tingling or new weaknesses Behavioral/Psych: Mood is stable, no new changes  All other systems were reviewed with the patient and are negative.  PHYSICAL EXAMINATION: ECOG PERFORMANCE STATUS: 0 - Asymptomatic  Filed Vitals:   02/26/16 1026  BP: 167/79  Pulse: 78  Temp: 98 F (36.7 C)  Resp: 18   Filed Weights   02/26/16 1026  Weight: 175 lb 11.2 oz (79.697 kg)    GENERAL:alert, no distress and comfortable SKIN: skin color,  texture, turgor are normal, no rashes or significant lesions EYES: normal, Conjunctiva are pink and non-injected, sclera clear Musculoskeletal:no cyanosis of digits and no clubbing  NEURO: alert & oriented x 3 with fluent speech, no focal motor/sensory deficits  LABORATORY DATA:  I have reviewed the data as listed Results for orders placed or performed in visit on 02/26/16 (from the past 48 hour(s))  CBC & Diff and Retic     Status: Abnormal   Collection Time: 02/26/16 10:11 AM  Result Value Ref Range   WBC 7.5 3.9 - 10.3 10e3/uL   NEUT# 5.1 1.5 - 6.5 10e3/uL   HGB 11.9 11.6 - 15.9 g/dL   HCT 36.7 34.8 - 46.6 %   Platelets 164 145 - 400 10e3/uL   MCV 94.8 79.5 - 101.0 fL   MCH 30.7 25.1 - 34.0 pg   MCHC 32.4 31.5 - 36.0 g/dL   RBC 3.87 3.70 -  5.45 10e6/uL   RDW 13.7 11.2 - 14.5 %   lymph# 1.3 0.9 - 3.3 10e3/uL   MONO# 0.7 0.1 - 0.9 10e3/uL   Eosinophils Absolute 0.3 0.0 - 0.5 10e3/uL   Basophils Absolute 0.0 0.0 - 0.1 10e3/uL   NEUT% 68.6 38.4 - 76.8 %   LYMPH% 18.0 14.0 - 49.7 %   MONO% 9.1 0.0 - 14.0 %   EOS% 4.0 0.0 - 7.0 %   BASO% 0.3 0.0 - 2.0 %   Retic % 3.98 (H) 0.70 - 2.10 %   Retic Ct Abs 154.03 (H) 33.70 - 90.70 10e3/uL   Immature Retic Fract 11.60 (H) 1.60 - 10.00 %    Lab Results  Component Value Date   WBC 7.5 02/26/2016   HGB 11.9 02/26/2016   HCT 36.7 02/26/2016   MCV 94.8 02/26/2016   PLT 164 02/26/2016   ASSESSMENT & PLAN:  Deficiency anemia Since the last visit, she has not needed erythropoietin stimulating agents. Her anemia has resolved. I suspect the cause of anemia was multifactorial, a combination of anemia of chronic disease and iron deficiency anemia. I recommend she continues one tablets of oral iron supplement indefinitely. I will transition her care and blood work monitoring to her primary care provider. I will see her back in the future if her hemoglobin drifted less than 10 g.  Essential hypertension Her blood pressure is suboptimally  controlled she will continue current medical management. I recommend close follow-up with primary care doctor for medication adjustment.   Allergic sinusitis She has symptoms of sinus congestion. She has no fever or leukocytosis. I recommend conservative management with over-the-counter allergy medications.   All questions were answered. The patient knows to call the clinic with any problems, questions or concerns. No barriers to learning was detected.  I spent 10 minutes counseling the patient face to face. The total time spent in the appointment was 15 minutes and more than 50% was on counseling.     [facility], Shandon Burlingame, MD 3/31/201711:27 AM

## 2016-02-26 NOTE — Assessment & Plan Note (Signed)
She has symptoms of sinus congestion. She has no fever or leukocytosis. I recommend conservative management with over-the-counter allergy medications.

## 2016-02-26 NOTE — Assessment & Plan Note (Signed)
Her blood pressure is suboptimally controlled she will continue current medical management. I recommend close follow-up with primary care doctor for medication adjustment.

## 2016-03-07 ENCOUNTER — Other Ambulatory Visit: Payer: Self-pay | Admitting: Adult Health

## 2016-03-07 NOTE — Telephone Encounter (Signed)
Rx request sent to pharmacy.  

## 2016-04-27 ENCOUNTER — Encounter: Payer: Self-pay | Admitting: Podiatry

## 2016-04-27 ENCOUNTER — Ambulatory Visit (INDEPENDENT_AMBULATORY_CARE_PROVIDER_SITE_OTHER): Payer: Medicare Other | Admitting: Podiatry

## 2016-04-27 DIAGNOSIS — M79674 Pain in right toe(s): Secondary | ICD-10-CM | POA: Diagnosis not present

## 2016-04-27 DIAGNOSIS — B351 Tinea unguium: Secondary | ICD-10-CM

## 2016-04-27 NOTE — Patient Instructions (Signed)
Okay to use a small amount of Vaseline along the edges of the right big toenail to soften the callused nail groove. Use as needed  Diabetes and Foot Care Diabetes may cause you to have problems because of poor blood supply (circulation) to your feet and legs. This may cause the skin on your feet to become thinner, break easier, and heal more slowly. Your skin may become dry, and the skin may peel and crack. You may also have nerve damage in your legs and feet causing decreased feeling in them. You may not notice minor injuries to your feet that could lead to infections or more serious problems. Taking care of your feet is one of the most important things you can do for yourself.  HOME CARE INSTRUCTIONS  Wear shoes at all times, even in the house. Do not go barefoot. Bare feet are easily injured.  Check your feet daily for blisters, cuts, and redness. If you cannot see the bottom of your feet, use a mirror or ask someone for help.  Wash your feet with warm water (do not use hot water) and mild soap. Then pat your feet and the areas between your toes until they are completely dry. Do not soak your feet as this can dry your skin.  Apply a moisturizing lotion or petroleum jelly (that does not contain alcohol and is unscented) to the skin on your feet and to dry, brittle toenails. Do not apply lotion between your toes.  Trim your toenails straight across. Do not dig under them or around the cuticle. File the edges of your nails with an emery board or nail file.  Do not cut corns or calluses or try to remove them with medicine.  Wear clean socks or stockings every day. Make sure they are not too tight. Do not wear knee-high stockings since they may decrease blood flow to your legs.  Wear shoes that fit properly and have enough cushioning. To break in new shoes, wear them for just a few hours a day. This prevents you from injuring your feet. Always look in your shoes before you put them on to be sure  there are no objects inside.  Do not cross your legs. This may decrease the blood flow to your feet.  If you find a minor scrape, cut, or break in the skin on your feet, keep it and the skin around it clean and dry. These areas may be cleansed with mild soap and water. Do not cleanse the area with peroxide, alcohol, or iodine.  When you remove an adhesive bandage, be sure not to damage the skin around it.  If you have a wound, look at it several times a day to make sure it is healing.  Do not use heating pads or hot water bottles. They may burn your skin. If you have lost feeling in your feet or legs, you may not know it is happening until it is too late.  Make sure your health care provider performs a complete foot exam at least annually or more often if you have foot problems. Report any cuts, sores, or bruises to your health care provider immediately. SEEK MEDICAL CARE IF:   You have an injury that is not healing.  You have cuts or breaks in the skin.  You have an ingrown nail.  You notice redness on your legs or feet.  You feel burning or tingling in your legs or feet.  You have pain or cramps in your legs  and feet.  Your legs or feet are numb.  Your feet always feel cold. SEEK IMMEDIATE MEDICAL CARE IF:   There is increasing redness, swelling, or pain in or around a wound.  There is a red line that goes up your leg.  Pus is coming from a wound.  You develop a fever or as directed by your health care provider.  You notice a bad smell coming from an ulcer or wound.   This information is not intended to replace advice given to you by your health care provider. Make sure you discuss any questions you have with your health care provider.   Document Released: 11/11/2000 Document Revised: 07/17/2013 Document Reviewed: 04/23/2013 Elsevier Interactive Patient Education [employer].

## 2016-04-27 NOTE — Progress Notes (Signed)
Patient ID: [name], female   DOB: 1940/11/30, 75 y.o.   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

## 2016-05-02 ENCOUNTER — Other Ambulatory Visit: Payer: Self-pay | Admitting: Pulmonary Disease

## 2016-05-02 ENCOUNTER — Ambulatory Visit
Admission: RE | Admit: 2016-05-02 | Discharge: 2016-05-02 | Disposition: A | Discharge status: Home / Self Care | Payer: Medicare Other | Source: Ambulatory Visit | Attending: Pulmonary Disease | Admitting: Pulmonary Disease

## 2016-05-02 DIAGNOSIS — R51 Headache: Principal | ICD-10-CM

## 2016-05-02 DIAGNOSIS — R519 Headache, unspecified: Secondary | ICD-10-CM

## 2016-05-25 ENCOUNTER — Ambulatory Visit (INDEPENDENT_AMBULATORY_CARE_PROVIDER_SITE_OTHER): Payer: Medicare Other | Admitting: Cardiovascular Disease

## 2016-05-25 ENCOUNTER — Encounter: Payer: Self-pay | Admitting: Cardiovascular Disease

## 2016-05-25 VITALS — BP 130/58 | HR 69 | Ht 64.0 in | Wt 178.0 lb

## 2016-05-25 DIAGNOSIS — Z9861 Coronary angioplasty status: Secondary | ICD-10-CM

## 2016-05-25 DIAGNOSIS — I10 Essential (primary) hypertension: Secondary | ICD-10-CM | POA: Diagnosis not present

## 2016-05-25 DIAGNOSIS — I779 Disorder of arteries and arterioles, unspecified: Secondary | ICD-10-CM | POA: Diagnosis not present

## 2016-05-25 DIAGNOSIS — I2510 Atherosclerotic heart disease of native coronary artery without angina pectoris: Secondary | ICD-10-CM | POA: Diagnosis not present

## 2016-05-25 DIAGNOSIS — I739 Peripheral vascular disease, unspecified: Secondary | ICD-10-CM

## 2016-05-25 NOTE — Progress Notes (Signed)
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 I last saw her in the office 11/17/15.. 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 I last saw her in the office 06/10/14. She saw Tenny Craw PA-C 08/01/14. She was hospitalized 04/05/2014 secondary to acute respiratory failure requiring intubation. During the hospitalization her peak troponin was > 20 she had pulmonary edema requiring IV diuresing. Echo was done EF is 35-40% she did undergo left heart cath 04/09/2014 with three-vessel disease involving proximal and mid LAD, severe distal left main, ostial circumflex and RCA. PCI was not feasible due to coronary anatomy. T CTS evaluated her for bypass grafting but she was found to be a poor surgical candidate. PCI was felt to be the safest intervention at that point. 04/16/2014 she underwent Impala insertion IVUS, rotational atherectomy of the LAD, angioplasty and stenting of the proximal and mid LAD with Xience Alpine drug-eluting stent. Postop was complicated by hypotension, cardiogenic shock requiring dopamine drip. She also had acute blood loss anemia requiring 2 units packed RBCs. The plan is to use Brilinta alone for her new stent for 12 months and then change to aspirin alone daily. She was mobilized by cardiac rehabilitation, continued to to improve and was finally discharged home 04/21/2014. I saw Pt for follow up on 04/28/14.  She had not started her Iron at that time. She had also complained of some dark stools. Labs were ordered and her H/H Had decreased from 9.4 to 8.8. Arrangements were made for her to see GI which she did and was then  admitted for transfusion of 2 units of packed cells on 05/09/2014. Plans are also new for virtual colonoscopy with camera.  Since her procedure she has done well except for blood loss anemia of unclear etiology despite aggressive GI evaluation. Initially I had recommended that she stop her aspirin however since she has a drug-eluting stent in LAD and is on Brilenta I suggested that she restart her aspirin with careful followup of her hemoglobin. She has gotten iron infusions since I last saw her and is followed by hematologist as well as a gastroenterologist. A 2-D echocardiogram performed on 11/07/14 revealed marked improvement in her ejection fraction from 35-65%. She continues to have a moderate pericardial effusion which has remained stable without evidence of pericardial tamponade by recent 2-D echocardiogram performed 05/13/15. Her EF was 45-50%. . She denies chest pain or shortness of breath. 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 These were addressed with laser by Dr. Henrene Pastor. She gets occasional chest pain which she attributed to the digestion. She apparently needs a small left breast mass removed under local anesthesia. We obtained a 2-D echo which revealed a decline in her LV function from the 45-50% down to 35% range As well as a Myoview stress test performed 08/28/15 which showed inferolateral scar with moderate peri-infarct ischemia. Based on this, I performed cardiac catheterization on her 09/21/15 via the right radial approach revealing a widely patent LAD stent, 60% left main and 40% distal RCA with an EF of 45-50%. She is asymptomatic. She also had  her left breast mass successfully excised. This was found to be not neoplastic. Since I saw her in the office 6 months ago she's remained clinically stable.   Current Outpatient Prescriptions  Medication Sig Dispense Refill  . ADVAIR HFA 115-21  MCG/ACT inhaler Inhale 2 puffs into the lungs daily.    Marland Kitchen albuterol (PROVENTIL HFA;VENTOLIN HFA) 108 (90 BASE) MCG/ACT inhaler Inhale 1 puff into the lungs every 6 (six) hours as needed for shortness of breath.     . ALPRAZolam (XANAX) 0.5 MG tablet Take 0.25 mg by mouth 2 (two) times daily as needed for anxiety or sleep.    Marland Kitchen ammonium lactate (LAC-HYDRIN) 12 % lotion Apply 1 application topically daily as needed for dry skin (psorias).     . Artificial Tear Ointment (LUBRICANT EYE OP) Place 1 drop into both eyes as needed (for dry eyes).    Marland Kitchen aspirin 81 MG tablet Take 81 mg by mouth daily.    . carvedilol (COREG) 3.125 MG tablet TAKE 1 TABLET BY MOUTH TWICE A DAY WITH A MEAL 60 tablet 1  . Cholecalciferol (VITAMIN D) 2000 UNITS CAPS Take 2,000 Units by mouth every morning.     . dorzolamide (TRUSOPT) 2 % ophthalmic solution Place 1 drop into the right eye 2 (two) times daily.    Marland Kitchen esomeprazole (NEXIUM) 40 MG capsule Take 40 mg by mouth daily before breakfast.    . feeding supplement, GLUCERNA SHAKE, (GLUCERNA SHAKE) LIQD Take 237 mLs by mouth daily at 3 pm.     . ferrous sulfate 325 (65 FE) MG tablet Take 1 tablet (325 mg total) by mouth 2 (two) times daily with a meal. 60 tablet 2  . fluticasone (FLONASE) 50 MCG/ACT nasal spray Place 1 spray into both nostrils at bedtime as needed for allergies.     . furosemide (LASIX) 40 MG tablet Take 1 tablet (40 mg total) by mouth daily. 30 tablet   . glimepiride (AMARYL) 2 MG tablet Take 2 mg by mouth every morning.     Marland Kitchen ipratropium-albuterol (DUONEB) 0.5-2.5 (3) MG/3ML SOLN Take 3 mLs by nebulization every 4 (four) hours as needed (shortness of breath).    . Liniments ([employer] ARTHRITIS PAIN RELIEVER EX) Apply 1 application topically 2 (two) times daily as needed (for shoulder pains, knee, back.).     Marland Kitchen metFORMIN (GLUCOPHAGE) 500 MG tablet Take 500 mg by mouth daily after supper. Pt. Told that she is  only to take only as needed , for a blood sugar of 250 or  greater    . Multiple Vitamins-Minerals (ICAPS) CAPS Take 1 capsule by mouth every morning.     . ONE TOUCH ULTRA TEST test strip 1 each by Other route See admin instructions. Check blood sugar twice daily    . pravastatin (PRAVACHOL) 10 MG tablet Take 10 mg by mouth 2 (two) times a week. Monday  and Thursday    . predniSONE (DELTASONE) 5 MG tablet Take 5 mg by mouth every Monday, Wednesday, and Friday.     . ranitidine (ZANTAC) 150 MG capsule Take 150 mg by mouth at bedtime.     . simethicone (MYLICON) 80 MG chewable tablet Chew 1 tablet (80 mg total) by mouth 4 (four) times daily as needed for flatulence. 30 tablet 0  . Travoprost, BAK Free, (TRAVATAMN) 0.004 % SOLN ophthalmic solution Place 1 drop into both eyes at bedtime.      No current facility-administered medications for this visit.    Allergies  Allergen Reactions  . Amlodipine Besy-Benazepril Hcl Other (See Comments)    Nervous/shakiness  . Statins Other (See Comments)    "can't take any of them; cramp me up" (12/06/2012)  . Tradjenta [Linagliptin] Other (See Comments)    Extreme muscle pains, chest and back pains  . Plavix [Clopidogrel] Rash and Other (See Comments)    Severe burning of the skin.Pt states that the doctor told her that her rash was psoriasis and not related to plavix but she states the rash came from the plavix  . Raptiva [Efalizumab] Rash    Social History   Social History  . Marital Status: Widowed    Spouse Name: N/A  . Number of Children: 0  . Years of Education: N/A   Occupational History  . 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 Smoking status: Former Smoker -- 1.00 packs/day for 40 years    Types: Cigarettes  . Smokeless tobacco: Former Systems developer    Quit date: 09/28/2013     Comment: pt states that she smokes "every once in a while"  . Alcohol Use: No     Comment: "quit social drinking years ago"  . Drug Use: No  . Sexual Activity: No   Other Topics  Concern  . Not on file   Social History Narrative     Review of Systems: General: negative for chills, fever, night sweats or weight changes.  Cardiovascular: negative for chest pain, dyspnea on exertion, edema, orthopnea, palpitations, paroxysmal nocturnal dyspnea or shortness of breath Dermatological: negative for rash Respiratory: negative for cough or wheezing Urologic: negative for hematuria Abdominal: negative for nausea, vomiting, diarrhea, bright red blood per rectum, melena, or hematemesis Neurologic: negative for visual changes, syncope, or dizziness All other systems reviewed and are otherwise negative except as noted above.    Blood pressure 130/58, pulse 69, height 5\' 4"  (1.626 m), weight 178 lb (80.74 kg).  General appearance: alert and no distress Neck: no adenopathy, no JVD, supple, symmetrical, trachea midline, thyroid not enlarged, symmetric, no tenderness/mass/nodules and Soft left carotid bruit Lungs: clear to auscultation bilaterally Heart: normal apical impulse Extremities: extremities normal, atraumatic, no cyanosis or edema  EKG sinus rhythm at 69 occasional PVC and Q waves V1 through V5 with left axis deviation. I personally reviewed this EKG  ASSESSMENT AND PLAN:   Essential hypertension History of hypertension with blood pressure measured at 130/58. She is on carvedilol. Current meds at current dosing  CAD S/P percutaneous coronary angioplasty History of CAD status post cath 04/09/14 revealing three-vessel disease involving the proximal mid LAD, severe distal left main, ostial circumflex and RCA. PCI was not feasible due to her coronary anatomy and surgeons thought she was a poor surgical candidate. She also underwent high-speed rotational atherectomy PCI and stenting by Dr. Burt Knack. The right radial approach with Jerilynn Som hemodynamic support. She did well after that. She does have EF in the 35% range. She denies chest pain. I performed cardiac catheterization  on her 09/21/15. Right radial approach after Myoview stress test performed 08/28/15 showed inferolateral scar with moderate peri-infarct ischemia. This revealed a widely patent LAD stent, 60% left main, 40% distal RCA with an EF of 45-50%.  Hyperlipidemia with target LDL less than 70 History of hyperlipidemia on statin therapy with recent lipid profile performed 12/02/15 revealed a total cholesterol 96, LDL 110 and HDL of 50  Left-sided carotid artery disease History of carotid artery disease with recent Doppler performed 01/12/16 revealing  moderate left ICA stenosis. This is being followed on an annual basis.      Lorretta Harp MD FACP,FACC,FAHA, [facility] 05/25/2016 10:40 AM

## 2016-05-25 NOTE — Assessment & Plan Note (Signed)
History of CAD status post cath 04/09/14 revealing three-vessel disease involving the proximal mid LAD, severe distal left main, ostial circumflex and RCA. PCI was not feasible due to her coronary anatomy and surgeons thought she was a poor surgical candidate. She also underwent high-speed rotational atherectomy PCI and stenting by Dr. Burt Knack. The right radial approach with Jerilynn Som hemodynamic support. She did well after that. She does have EF in the 35% range. She denies chest pain. I performed cardiac catheterization on her 09/21/15. Right radial approach after Myoview stress test performed 08/28/15 showed inferolateral scar with moderate peri-infarct ischemia. This revealed a widely patent LAD stent, 60% left main, 40% distal RCA with an EF of 45-50%.

## 2016-05-25 NOTE — Assessment & Plan Note (Signed)
History of carotid artery disease with recent Doppler performed 01/12/16 revealing moderate left ICA stenosis. This is being followed on an annual basis.

## 2016-05-25 NOTE — Assessment & Plan Note (Signed)
History of hyperlipidemia on statin therapy with recent lipid profile performed 12/02/15 revealed a total cholesterol 96, LDL 110 and HDL of 50

## 2016-05-25 NOTE — Patient Instructions (Signed)
Medication Instructions:  Your physician recommends that you continue on your current medications as directed. Please refer to the Current Medication list given to you today.   Follow-Up: We request that you follow-up in: [place] and in 12 MONTHS with Dr Andria Rhein will receive a reminder letter in the mail two months in advance. If you don't receive a letter, please call our office to schedule the follow-up appointment.    Any Other Special Instructions Will Be Listed Below (If Applicable).     If you need a refill on your cardiac medications before your next appointment, please call your pharmacy.

## 2016-05-25 NOTE — Assessment & Plan Note (Signed)
History of hypertension with blood pressure measured at 130/58. She is on carvedilol. Current meds at current dosing

## 2016-07-15 ENCOUNTER — Other Ambulatory Visit: Payer: Self-pay | Admitting: Cardiovascular Disease

## 2016-07-15 NOTE — Telephone Encounter (Signed)
Rx request sent to pharmacy.  

## 2016-08-03 ENCOUNTER — Encounter: Payer: Self-pay | Admitting: Podiatry

## 2016-08-03 ENCOUNTER — Ambulatory Visit (INDEPENDENT_AMBULATORY_CARE_PROVIDER_SITE_OTHER): Payer: Medicare Other | Admitting: Podiatry

## 2016-08-03 DIAGNOSIS — B351 Tinea unguium: Secondary | ICD-10-CM

## 2016-08-03 DIAGNOSIS — M79674 Pain in right toe(s): Secondary | ICD-10-CM

## 2016-08-03 NOTE — Patient Instructions (Signed)
Diabetes and Foot Care Diabetes may cause you to have problems because of poor blood supply (circulation) to your feet and legs. This may cause the skin on your feet to become thinner, break easier, and heal more slowly. Your skin may become dry, and the skin may peel and crack. You may also have nerve damage in your legs and feet causing decreased feeling in them. You may not notice minor injuries to your feet that could lead to infections or more serious problems. Taking care of your feet is one of the most important things you can do for yourself.  HOME CARE INSTRUCTIONS  Wear shoes at all times, even in the house. Do not go barefoot. Bare feet are easily injured.  Check your feet daily for blisters, cuts, and redness. If you cannot see the bottom of your feet, use a mirror or ask someone for help.  Wash your feet with warm water (do not use hot water) and mild soap. Then pat your feet and the areas between your toes until they are completely dry. Do not soak your feet as this can dry your skin.  Apply a moisturizing lotion or petroleum jelly (that does not contain alcohol and is unscented) to the skin on your feet and to dry, brittle toenails. Do not apply lotion between your toes.  Trim your toenails straight across. Do not dig under them or around the cuticle. File the edges of your nails with an emery board or nail file.  Do not cut corns or calluses or try to remove them with medicine.  Wear clean socks or stockings every day. Make sure they are not too tight. Do not wear knee-high stockings since they may decrease blood flow to your legs.  Wear shoes that fit properly and have enough cushioning. To break in new shoes, wear them for just a few hours a day. This prevents you from injuring your feet. Always look in your shoes before you put them on to be sure there are no objects inside.  Do not cross your legs. This may decrease the blood flow to your feet.  If you find a minor scrape,  cut, or break in the skin on your feet, keep it and the skin around it clean and dry. These areas may be cleansed with mild soap and water. Do not cleanse the area with peroxide, alcohol, or iodine.  When you remove an adhesive bandage, be sure not to damage the skin around it.  If you have a wound, look at it several times a day to make sure it is healing.  Do not use heating pads or hot water bottles. They may burn your skin. If you have lost feeling in your feet or legs, you may not know it is happening until it is too late.  Make sure your health care provider performs a complete foot exam at least annually or more often if you have foot problems. Report any cuts, sores, or bruises to your health care provider immediately. SEEK MEDICAL CARE IF:   You have an injury that is not healing.  You have cuts or breaks in the skin.  You have an ingrown nail.  You notice redness on your legs or feet.  You feel burning or tingling in your legs or feet.  You have pain or cramps in your legs and feet.  Your legs or feet are numb.  Your feet always feel cold. SEEK IMMEDIATE MEDICAL CARE IF:   There is increasing redness,   swelling, or pain in or around a wound.  There is a red line that goes up your leg.  Pus is coming from a wound.  You develop a fever or as directed by your health care provider.  You notice a bad smell coming from an ulcer or wound.   This information is not intended to replace advice given to you by your health care provider. Make sure you discuss any questions you have with your health care provider.   Document Released: 11/11/2000 Document Revised: 07/17/2013 Document Reviewed: 04/23/2013 Elsevier Interactive Patient Education 2016 [employer]  

## 2016-08-03 NOTE — Progress Notes (Signed)
Patient ID: [name], female   DOB: 02-Apr-1941, 75 y.o.   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

## 2016-09-30 ENCOUNTER — Emergency Department (HOSPITAL_COMMUNITY): Payer: Medicare Other

## 2016-09-30 ENCOUNTER — Encounter (HOSPITAL_COMMUNITY): Payer: Self-pay

## 2016-09-30 ENCOUNTER — Inpatient Hospital Stay (HOSPITAL_COMMUNITY)
Admission: EM | Admit: 2016-09-30 | Discharge: 2016-10-20 | DRG: 004 | Disposition: A | Discharge status: Other Acute Inpatient Hospital | Payer: Medicare Other | Attending: Internal Medicine | Admitting: Internal Medicine

## 2016-09-30 DIAGNOSIS — I10 Essential (primary) hypertension: Secondary | ICD-10-CM

## 2016-09-30 DIAGNOSIS — J9600 Acute respiratory failure, unspecified whether with hypoxia or hypercapnia: Secondary | ICD-10-CM | POA: Diagnosis not present

## 2016-09-30 DIAGNOSIS — J439 Emphysema, unspecified: Secondary | ICD-10-CM | POA: Diagnosis not present

## 2016-09-30 DIAGNOSIS — E118 Type 2 diabetes mellitus with unspecified complications: Secondary | ICD-10-CM

## 2016-09-30 DIAGNOSIS — I5023 Acute on chronic systolic (congestive) heart failure: Secondary | ICD-10-CM | POA: Diagnosis not present

## 2016-09-30 DIAGNOSIS — I5042 Chronic combined systolic (congestive) and diastolic (congestive) heart failure: Secondary | ICD-10-CM

## 2016-09-30 DIAGNOSIS — J9601 Acute respiratory failure with hypoxia: Secondary | ICD-10-CM

## 2016-09-30 DIAGNOSIS — J9602 Acute respiratory failure with hypercapnia: Secondary | ICD-10-CM

## 2016-09-30 DIAGNOSIS — Z87891 Personal history of nicotine dependence: Secondary | ICD-10-CM

## 2016-09-30 DIAGNOSIS — J9811 Atelectasis: Secondary | ICD-10-CM

## 2016-09-30 DIAGNOSIS — I252 Old myocardial infarction: Secondary | ICD-10-CM

## 2016-09-30 DIAGNOSIS — R0682 Tachypnea, not elsewhere classified: Secondary | ICD-10-CM

## 2016-09-30 DIAGNOSIS — R414 Neurologic neglect syndrome: Secondary | ICD-10-CM | POA: Diagnosis present

## 2016-09-30 DIAGNOSIS — E870 Hyperosmolality and hypernatremia: Secondary | ICD-10-CM | POA: Diagnosis present

## 2016-09-30 DIAGNOSIS — R4701 Aphasia: Secondary | ICD-10-CM | POA: Diagnosis present

## 2016-09-30 DIAGNOSIS — I2510 Atherosclerotic heart disease of native coronary artery without angina pectoris: Secondary | ICD-10-CM | POA: Diagnosis present

## 2016-09-30 DIAGNOSIS — Z833 Family history of diabetes mellitus: Secondary | ICD-10-CM

## 2016-09-30 DIAGNOSIS — M109 Gout, unspecified: Secondary | ICD-10-CM | POA: Diagnosis present

## 2016-09-30 DIAGNOSIS — N170 Acute kidney failure with tubular necrosis: Secondary | ICD-10-CM

## 2016-09-30 DIAGNOSIS — I161 Hypertensive emergency: Secondary | ICD-10-CM | POA: Diagnosis present

## 2016-09-30 DIAGNOSIS — R1310 Dysphagia, unspecified: Secondary | ICD-10-CM

## 2016-09-30 DIAGNOSIS — L4050 Arthropathic psoriasis, unspecified: Secondary | ICD-10-CM | POA: Diagnosis present

## 2016-09-30 DIAGNOSIS — Z7189 Other specified counseling: Secondary | ICD-10-CM

## 2016-09-30 DIAGNOSIS — E1122 Type 2 diabetes mellitus with diabetic chronic kidney disease: Secondary | ICD-10-CM | POA: Diagnosis present

## 2016-09-30 DIAGNOSIS — J189 Pneumonia, unspecified organism: Secondary | ICD-10-CM

## 2016-09-30 DIAGNOSIS — J9621 Acute and chronic respiratory failure with hypoxia: Secondary | ICD-10-CM | POA: Diagnosis not present

## 2016-09-30 DIAGNOSIS — N289 Disorder of kidney and ureter, unspecified: Secondary | ICD-10-CM | POA: Diagnosis not present

## 2016-09-30 DIAGNOSIS — I6789 Other cerebrovascular disease: Secondary | ICD-10-CM | POA: Diagnosis not present

## 2016-09-30 DIAGNOSIS — J811 Chronic pulmonary edema: Secondary | ICD-10-CM

## 2016-09-30 DIAGNOSIS — E1151 Type 2 diabetes mellitus with diabetic peripheral angiopathy without gangrene: Secondary | ICD-10-CM | POA: Diagnosis present

## 2016-09-30 DIAGNOSIS — Z961 Presence of intraocular lens: Secondary | ICD-10-CM | POA: Diagnosis present

## 2016-09-30 DIAGNOSIS — M170 Bilateral primary osteoarthritis of knee: Secondary | ICD-10-CM | POA: Diagnosis present

## 2016-09-30 DIAGNOSIS — G8929 Other chronic pain: Secondary | ICD-10-CM | POA: Diagnosis present

## 2016-09-30 DIAGNOSIS — N183 Chronic kidney disease, stage 3 unspecified: Secondary | ICD-10-CM

## 2016-09-30 DIAGNOSIS — K5900 Constipation, unspecified: Secondary | ICD-10-CM | POA: Diagnosis present

## 2016-09-30 DIAGNOSIS — Z430 Encounter for attention to tracheostomy: Secondary | ICD-10-CM | POA: Diagnosis not present

## 2016-09-30 DIAGNOSIS — I498 Other specified cardiac arrhythmias: Secondary | ICD-10-CM

## 2016-09-30 DIAGNOSIS — Z7984 Long term (current) use of oral hypoglycemic drugs: Secondary | ICD-10-CM

## 2016-09-30 DIAGNOSIS — Z781 Physical restraint status: Secondary | ICD-10-CM

## 2016-09-30 DIAGNOSIS — G936 Cerebral edema: Secondary | ICD-10-CM | POA: Diagnosis present

## 2016-09-30 DIAGNOSIS — Z930 Tracheostomy status: Secondary | ICD-10-CM

## 2016-09-30 DIAGNOSIS — I63512 Cerebral infarction due to unspecified occlusion or stenosis of left middle cerebral artery: Secondary | ICD-10-CM | POA: Diagnosis not present

## 2016-09-30 DIAGNOSIS — G8191 Hemiplegia, unspecified affecting right dominant side: Secondary | ICD-10-CM | POA: Diagnosis present

## 2016-09-30 DIAGNOSIS — D638 Anemia in other chronic diseases classified elsewhere: Secondary | ICD-10-CM | POA: Diagnosis present

## 2016-09-30 DIAGNOSIS — I6522 Occlusion and stenosis of left carotid artery: Secondary | ICD-10-CM | POA: Diagnosis present

## 2016-09-30 DIAGNOSIS — D72820 Lymphocytosis (symptomatic): Secondary | ICD-10-CM

## 2016-09-30 DIAGNOSIS — J441 Chronic obstructive pulmonary disease with (acute) exacerbation: Secondary | ICD-10-CM | POA: Diagnosis present

## 2016-09-30 DIAGNOSIS — R0600 Dyspnea, unspecified: Secondary | ICD-10-CM

## 2016-09-30 DIAGNOSIS — I638 Other cerebral infarction: Secondary | ICD-10-CM | POA: Diagnosis not present

## 2016-09-30 DIAGNOSIS — Z7951 Long term (current) use of inhaled steroids: Secondary | ICD-10-CM

## 2016-09-30 DIAGNOSIS — I619 Nontraumatic intracerebral hemorrhage, unspecified: Secondary | ICD-10-CM

## 2016-09-30 DIAGNOSIS — I5043 Acute on chronic combined systolic (congestive) and diastolic (congestive) heart failure: Secondary | ICD-10-CM | POA: Diagnosis present

## 2016-09-30 DIAGNOSIS — D62 Acute posthemorrhagic anemia: Secondary | ICD-10-CM | POA: Diagnosis not present

## 2016-09-30 DIAGNOSIS — Z978 Presence of other specified devices: Secondary | ICD-10-CM

## 2016-09-30 DIAGNOSIS — L8990 Pressure ulcer of unspecified site, unspecified stage: Secondary | ICD-10-CM | POA: Insufficient documentation

## 2016-09-30 DIAGNOSIS — Z9841 Cataract extraction status, right eye: Secondary | ICD-10-CM

## 2016-09-30 DIAGNOSIS — I745 Embolism and thrombosis of iliac artery: Secondary | ICD-10-CM | POA: Diagnosis present

## 2016-09-30 DIAGNOSIS — R1319 Other dysphagia: Secondary | ICD-10-CM | POA: Diagnosis present

## 2016-09-30 DIAGNOSIS — E46 Unspecified protein-calorie malnutrition: Secondary | ICD-10-CM

## 2016-09-30 DIAGNOSIS — I611 Nontraumatic intracerebral hemorrhage in hemisphere, cortical: Secondary | ICD-10-CM | POA: Diagnosis present

## 2016-09-30 DIAGNOSIS — Z681 Body mass index (BMI) 19 or less, adult: Secondary | ICD-10-CM | POA: Diagnosis not present

## 2016-09-30 DIAGNOSIS — Z801 Family history of malignant neoplasm of trachea, bronchus and lung: Secondary | ICD-10-CM

## 2016-09-30 DIAGNOSIS — Z4659 Encounter for fitting and adjustment of other gastrointestinal appliance and device: Secondary | ICD-10-CM

## 2016-09-30 DIAGNOSIS — Z9911 Dependence on respirator [ventilator] status: Secondary | ICD-10-CM

## 2016-09-30 DIAGNOSIS — K7460 Unspecified cirrhosis of liver: Secondary | ICD-10-CM | POA: Diagnosis present

## 2016-09-30 DIAGNOSIS — E8809 Other disorders of plasma-protein metabolism, not elsewhere classified: Secondary | ICD-10-CM

## 2016-09-30 DIAGNOSIS — Z888 Allergy status to other drugs, medicaments and biological substances status: Secondary | ICD-10-CM

## 2016-09-30 DIAGNOSIS — N182 Chronic kidney disease, stage 2 (mild): Secondary | ICD-10-CM

## 2016-09-30 DIAGNOSIS — K449 Diaphragmatic hernia without obstruction or gangrene: Secondary | ICD-10-CM | POA: Diagnosis present

## 2016-09-30 DIAGNOSIS — K5790 Diverticulosis of intestine, part unspecified, without perforation or abscess without bleeding: Secondary | ICD-10-CM

## 2016-09-30 DIAGNOSIS — S32591A Other specified fracture of right pubis, initial encounter for closed fracture: Secondary | ICD-10-CM | POA: Diagnosis present

## 2016-09-30 DIAGNOSIS — Z7952 Long term (current) use of systemic steroids: Secondary | ICD-10-CM

## 2016-09-30 DIAGNOSIS — K219 Gastro-esophageal reflux disease without esophagitis: Secondary | ICD-10-CM | POA: Diagnosis present

## 2016-09-30 DIAGNOSIS — I6302 Cerebral infarction due to thrombosis of basilar artery: Secondary | ICD-10-CM | POA: Diagnosis not present

## 2016-09-30 DIAGNOSIS — I878 Other specified disorders of veins: Secondary | ICD-10-CM | POA: Diagnosis present

## 2016-09-30 DIAGNOSIS — Z955 Presence of coronary angioplasty implant and graft: Secondary | ICD-10-CM

## 2016-09-30 DIAGNOSIS — IMO0002 Reserved for concepts with insufficient information to code with codable children: Secondary | ICD-10-CM

## 2016-09-30 DIAGNOSIS — J9690 Respiratory failure, unspecified, unspecified whether with hypoxia or hypercapnia: Secondary | ICD-10-CM

## 2016-09-30 DIAGNOSIS — I63412 Cerebral infarction due to embolism of left middle cerebral artery: Secondary | ICD-10-CM

## 2016-09-30 DIAGNOSIS — Z8261 Family history of arthritis: Secondary | ICD-10-CM

## 2016-09-30 DIAGNOSIS — E876 Hypokalemia: Secondary | ICD-10-CM | POA: Diagnosis present

## 2016-09-30 DIAGNOSIS — Z7982 Long term (current) use of aspirin: Secondary | ICD-10-CM

## 2016-09-30 DIAGNOSIS — M459 Ankylosing spondylitis of unspecified sites in spine: Secondary | ICD-10-CM | POA: Diagnosis present

## 2016-09-30 DIAGNOSIS — F419 Anxiety disorder, unspecified: Secondary | ICD-10-CM | POA: Diagnosis present

## 2016-09-30 DIAGNOSIS — J181 Lobar pneumonia, unspecified organism: Secondary | ICD-10-CM | POA: Diagnosis not present

## 2016-09-30 DIAGNOSIS — I130 Hypertensive heart and chronic kidney disease with heart failure and stage 1 through stage 4 chronic kidney disease, or unspecified chronic kidney disease: Secondary | ICD-10-CM | POA: Diagnosis present

## 2016-09-30 DIAGNOSIS — S32591S Other specified fracture of right pubis, sequela: Secondary | ICD-10-CM

## 2016-09-30 DIAGNOSIS — I639 Cerebral infarction, unspecified: Secondary | ICD-10-CM | POA: Diagnosis not present

## 2016-09-30 DIAGNOSIS — E1159 Type 2 diabetes mellitus with other circulatory complications: Secondary | ICD-10-CM | POA: Diagnosis not present

## 2016-09-30 DIAGNOSIS — I63411 Cerebral infarction due to embolism of right middle cerebral artery: Secondary | ICD-10-CM

## 2016-09-30 DIAGNOSIS — E1165 Type 2 diabetes mellitus with hyperglycemia: Secondary | ICD-10-CM | POA: Diagnosis present

## 2016-09-30 DIAGNOSIS — N189 Chronic kidney disease, unspecified: Secondary | ICD-10-CM | POA: Diagnosis not present

## 2016-09-30 DIAGNOSIS — Z9842 Cataract extraction status, left eye: Secondary | ICD-10-CM

## 2016-09-30 DIAGNOSIS — E872 Acidosis: Secondary | ICD-10-CM | POA: Diagnosis present

## 2016-09-30 DIAGNOSIS — I6300 Cerebral infarction due to thrombosis of unspecified precerebral artery: Secondary | ICD-10-CM | POA: Diagnosis not present

## 2016-09-30 DIAGNOSIS — E782 Mixed hyperlipidemia: Secondary | ICD-10-CM | POA: Diagnosis present

## 2016-09-30 DIAGNOSIS — Z8349 Family history of other endocrine, nutritional and metabolic diseases: Secondary | ICD-10-CM

## 2016-09-30 DIAGNOSIS — M549 Dorsalgia, unspecified: Secondary | ICD-10-CM | POA: Diagnosis present

## 2016-09-30 DIAGNOSIS — E119 Type 2 diabetes mellitus without complications: Secondary | ICD-10-CM

## 2016-09-30 DIAGNOSIS — I248 Other forms of acute ischemic heart disease: Secondary | ICD-10-CM | POA: Diagnosis present

## 2016-09-30 DIAGNOSIS — I272 Pulmonary hypertension, unspecified: Secondary | ICD-10-CM | POA: Diagnosis present

## 2016-09-30 DIAGNOSIS — D539 Nutritional anemia, unspecified: Secondary | ICD-10-CM

## 2016-09-30 DIAGNOSIS — E049 Nontoxic goiter, unspecified: Secondary | ICD-10-CM

## 2016-09-30 LAB — CBC WITH DIFFERENTIAL/PLATELET
BASOS PCT: 0 %
Basophils Absolute: 0 10*3/uL (ref 0.0–0.1)
Eosinophils Absolute: 0.1 10*3/uL (ref 0.0–0.7)
Eosinophils Relative: 1 %
HEMATOCRIT: 35.4 % — AB (ref 36.0–46.0)
HEMOGLOBIN: 11.5 g/dL — AB (ref 12.0–15.0)
LYMPHS PCT: 6 %
Lymphs Abs: 0.8 10*3/uL (ref 0.7–4.0)
MCH: 30.3 pg (ref 26.0–34.0)
MCHC: 32.5 g/dL (ref 30.0–36.0)
MCV: 93.2 fL (ref 78.0–100.0)
MONOS PCT: 6 %
Monocytes Absolute: 0.8 10*3/uL (ref 0.1–1.0)
NEUTROS ABS: 12.3 10*3/uL — AB (ref 1.7–7.7)
Neutrophils Relative %: 87 %
Platelets: 174 10*3/uL (ref 150–400)
RBC: 3.8 MIL/uL — ABNORMAL LOW (ref 3.87–5.11)
RDW: 13.9 % (ref 11.5–15.5)
WBC: 14 10*3/uL — ABNORMAL HIGH (ref 4.0–10.5)

## 2016-09-30 LAB — URINALYSIS, ROUTINE W REFLEX MICROSCOPIC
BILIRUBIN URINE: NEGATIVE
Glucose, UA: 100 mg/dL — AB
Ketones, ur: NEGATIVE mg/dL
Leukocytes, UA: NEGATIVE
Nitrite: NEGATIVE
PH: 5.5 (ref 5.0–8.0)
Protein, ur: 100 mg/dL — AB
SPECIFIC GRAVITY, URINE: 1.014 (ref 1.005–1.030)

## 2016-09-30 LAB — PROCALCITONIN: Procalcitonin: <0.1 ng/mL

## 2016-09-30 LAB — I-STAT CHEM 8, ED
BUN: 30 mg/dL — ABNORMAL HIGH (ref 6–20)
CHLORIDE: 102 mmol/L (ref 101–111)
Calcium, Ion: 1.18 mmol/L (ref 1.15–1.40)
Creatinine, Ser: 1.4 mg/dL — ABNORMAL HIGH (ref 0.44–1.00)
GLUCOSE: 266 mg/dL — AB (ref 65–99)
HEMATOCRIT: 36 % (ref 36.0–46.0)
Hemoglobin: 12.2 g/dL (ref 12.0–15.0)
POTASSIUM: 4.4 mmol/L (ref 3.5–5.1)
Sodium: 138 mmol/L (ref 135–145)
TCO2: 24 mmol/L (ref 0–100)

## 2016-09-30 LAB — BRAIN NATRIURETIC PEPTIDE: B Natriuretic Peptide: 737.8 pg/mL — ABNORMAL HIGH (ref 0.0–100.0)

## 2016-09-30 LAB — URINE MICROSCOPIC-ADD ON

## 2016-09-30 LAB — COMPREHENSIVE METABOLIC PANEL
ALT: 21 U/L (ref 14–54)
AST: 30 U/L (ref 15–41)
Albumin: 3.3 g/dL — ABNORMAL LOW (ref 3.5–5.0)
Alkaline Phosphatase: 66 U/L (ref 38–126)
Anion gap: 10 (ref 5–15)
BILIRUBIN TOTAL: 0.7 mg/dL (ref 0.3–1.2)
BUN: 27 mg/dL — AB (ref 6–20)
CHLORIDE: 103 mmol/L (ref 101–111)
CO2: 25 mmol/L (ref 22–32)
CREATININE: 1.45 mg/dL — AB (ref 0.44–1.00)
Calcium: 9.6 mg/dL (ref 8.9–10.3)
GFR, EST AFRICAN AMERICAN: 39 mL/min — AB (ref >60)
GFR, EST NON AFRICAN AMERICAN: 34 mL/min — AB (ref >60)
Glucose, Bld: 270 mg/dL — ABNORMAL HIGH (ref 65–99)
Potassium: 4.4 mmol/L (ref 3.5–5.1)
Sodium: 138 mmol/L (ref 135–145)
Total Protein: 6.5 g/dL (ref 6.5–8.1)

## 2016-09-30 LAB — LACTIC ACID, PLASMA
LACTIC ACID, VENOUS: 1.9 mmol/L (ref 0.5–1.9)
Lactic Acid, Venous: 2.2 mmol/L (ref 0.5–1.9)

## 2016-09-30 LAB — I-STAT ARTERIAL BLOOD GAS, ED
ACID-BASE EXCESS: 1 mmol/L (ref 0.0–2.0)
BICARBONATE: 27.7 mmol/L (ref 20.0–28.0)
O2 SAT: 100 %
PH ART: 7.32 — AB (ref 7.350–7.450)
TCO2: 29 mmol/L (ref 0–100)
pCO2 arterial: 54.5 mmHg — ABNORMAL HIGH (ref 32.0–48.0)
pO2, Arterial: 427 mmHg — ABNORMAL HIGH (ref 83.0–108.0)

## 2016-09-30 LAB — I-STAT TROPONIN, ED: TROPONIN I, POC: 0.05 ng/mL (ref 0.00–0.08)

## 2016-09-30 LAB — TYPE AND SCREEN
ABO/RH(D): A NEG
Antibody Screen: NEGATIVE

## 2016-09-30 LAB — PROTIME-INR
INR: 1.04
PROTHROMBIN TIME: 13.6 s (ref 11.4–15.2)

## 2016-09-30 LAB — LIPASE, BLOOD: Lipase: 42 U/L (ref 11–51)

## 2016-09-30 LAB — CBG MONITORING, ED: Glucose-Capillary: 295 mg/dL — ABNORMAL HIGH (ref 65–99)

## 2016-09-30 LAB — I-STAT CG4 LACTIC ACID, ED
LACTIC ACID, VENOUS: 2.05 mmol/L — AB (ref 0.5–1.9)
Lactic Acid, Venous: 1.92 mmol/L (ref 0.5–1.9)

## 2016-09-30 LAB — TRIGLYCERIDES
TRIGLYCERIDES: 159 mg/dL — AB (ref <150)
TRIGLYCERIDES: 141 mg/dL (ref <150)

## 2016-09-30 LAB — APTT: APTT: 24 s (ref 24–36)

## 2016-09-30 MED ORDER — METHYLPREDNISOLONE SODIUM SUCC 125 MG IJ SOLR
125.0000 mg | Freq: Once | INTRAMUSCULAR | Status: AC
  Administered 2016-09-30: 125 mg via INTRAVENOUS
  Filled 2016-09-30: qty 2

## 2016-09-30 MED ORDER — PROPOFOL 1000 MG/100ML IV EMUL
INTRAVENOUS | Status: AC
  Filled 2016-09-30: qty 100

## 2016-09-30 MED ORDER — ORAL CARE MOUTH RINSE
15.0000 mL | Freq: Four times a day (QID) | OROMUCOSAL | Status: DC
  Administered 2016-09-30 – 2016-10-01 (×2): 15 mL via OROMUCOSAL

## 2016-09-30 MED ORDER — LABETALOL HCL 5 MG/ML IV SOLN
10.0000 mg | INTRAVENOUS | Status: DC | PRN
  Administered 2016-10-02 – 2016-10-08 (×9): 10 mg via INTRAVENOUS
  Filled 2016-09-30 (×8): qty 4

## 2016-09-30 MED ORDER — MIDAZOLAM HCL 2 MG/2ML IJ SOLN
4.0000 mg | Freq: Once | INTRAMUSCULAR | Status: DC
Start: 2016-09-30 — End: 2016-09-30

## 2016-09-30 MED ORDER — IPRATROPIUM-ALBUTEROL 0.5-2.5 (3) MG/3ML IN SOLN: 3.0000 mL | RESPIRATORY_TRACT | Status: DC | PRN

## 2016-09-30 MED ORDER — BUDESONIDE 0.5 MG/2ML IN SUSP
0.5000 mg | Freq: Two times a day (BID) | RESPIRATORY_TRACT | Status: DC
  Administered 2016-10-01 – 2016-10-20 (×39): 0.5 mg via RESPIRATORY_TRACT
  Filled 2016-09-30 (×40): qty 2

## 2016-09-30 MED ORDER — LABETALOL HCL 5 MG/ML IV SOLN
20.0000 mg | Freq: Once | INTRAVENOUS | Status: AC
  Administered 2016-09-30: 20 mg via INTRAVENOUS

## 2016-09-30 MED ORDER — CHLORHEXIDINE GLUCONATE 0.12% ORAL RINSE (MEDLINE KIT)
15.0000 mL | Freq: Two times a day (BID) | OROMUCOSAL | Status: DC
  Administered 2016-10-01 – 2016-10-03 (×4): 15 mL via OROMUCOSAL

## 2016-09-30 MED ORDER — ETOMIDATE 2 MG/ML IV SOLN
INTRAVENOUS | Status: AC | PRN
  Administered 2016-09-30: 20 mg via INTRAVENOUS

## 2016-09-30 MED ORDER — PIPERACILLIN-TAZOBACTAM 3.375 G IVPB 30 MIN
3.3750 g | Freq: Once | INTRAVENOUS | Status: AC
  Administered 2016-09-30: 3.375 g via INTRAVENOUS
  Filled 2016-09-30: qty 50

## 2016-09-30 MED ORDER — IOPAMIDOL (ISOVUE-370) INJECTION 76%
INTRAVENOUS | Status: AC
  Administered 2016-09-30: 100 mL
  Filled 2016-09-30: qty 100

## 2016-09-30 MED ORDER — SODIUM CHLORIDE 0.9 % IV SOLN
INTRAVENOUS | Status: DC
  Administered 2016-09-30: 15:00:00 via INTRAVENOUS
  Administered 2016-10-02: 45 mL via INTRAVENOUS
  Administered 2016-10-03 – 2016-10-08 (×2): via INTRAVENOUS
  Administered 2016-10-10: 45 mL/h via INTRAVENOUS

## 2016-09-30 MED ORDER — FENTANYL BOLUS VIA INFUSION
25.0000 ug | INTRAVENOUS | Status: DC | PRN
  Filled 2016-09-30: qty 25

## 2016-09-30 MED ORDER — MIDAZOLAM HCL 2 MG/2ML IJ SOLN
INTRAMUSCULAR | Status: AC
  Filled 2016-09-30: qty 4

## 2016-09-30 MED ORDER — INSULIN ASPART 100 UNIT/ML ~~LOC~~ SOLN
0.0000 [IU] | SUBCUTANEOUS | Status: DC
  Administered 2016-09-30 – 2016-10-01 (×3): 11 [IU] via SUBCUTANEOUS
  Administered 2016-10-01 (×2): 4 [IU] via SUBCUTANEOUS
  Administered 2016-10-01 – 2016-10-02 (×3): 3 [IU] via SUBCUTANEOUS
  Administered 2016-10-02: 4 [IU] via SUBCUTANEOUS
  Administered 2016-10-02 (×5): 3 [IU] via SUBCUTANEOUS
  Administered 2016-10-03: 4 [IU] via SUBCUTANEOUS
  Administered 2016-10-03: 7 [IU] via SUBCUTANEOUS
  Administered 2016-10-03: 4 [IU] via SUBCUTANEOUS
  Administered 2016-10-03: 7 [IU] via SUBCUTANEOUS
  Administered 2016-10-03: 4 [IU] via SUBCUTANEOUS
  Administered 2016-10-04: 7 [IU] via SUBCUTANEOUS
  Administered 2016-10-04: 4 [IU] via SUBCUTANEOUS
  Administered 2016-10-04: 3 [IU] via SUBCUTANEOUS
  Administered 2016-10-04: 4 [IU] via SUBCUTANEOUS
  Administered 2016-10-04: 3 [IU] via SUBCUTANEOUS
  Administered 2016-10-04 – 2016-10-05 (×4): 4 [IU] via SUBCUTANEOUS
  Administered 2016-10-05: 7 [IU] via SUBCUTANEOUS
  Administered 2016-10-05: 4 [IU] via SUBCUTANEOUS
  Administered 2016-10-05: 7 [IU] via SUBCUTANEOUS
  Administered 2016-10-06: 4 [IU] via SUBCUTANEOUS
  Administered 2016-10-06: 3 [IU] via SUBCUTANEOUS
  Administered 2016-10-06: 4 [IU] via SUBCUTANEOUS
  Administered 2016-10-06 (×2): 7 [IU] via SUBCUTANEOUS
  Administered 2016-10-06: 3 [IU] via SUBCUTANEOUS
  Administered 2016-10-07 (×2): 4 [IU] via SUBCUTANEOUS
  Administered 2016-10-07: 7 [IU] via SUBCUTANEOUS
  Administered 2016-10-07 – 2016-10-08 (×4): 4 [IU] via SUBCUTANEOUS
  Administered 2016-10-08 (×2): 7 [IU] via SUBCUTANEOUS
  Administered 2016-10-08: 4 [IU] via SUBCUTANEOUS
  Administered 2016-10-09: 7 [IU] via SUBCUTANEOUS
  Administered 2016-10-09: 4 [IU] via SUBCUTANEOUS
  Administered 2016-10-09 (×2): 7 [IU] via SUBCUTANEOUS
  Administered 2016-10-09 – 2016-10-10 (×3): 4 [IU] via SUBCUTANEOUS
  Administered 2016-10-10 (×2): 7 [IU] via SUBCUTANEOUS
  Administered 2016-10-10: 11 [IU] via SUBCUTANEOUS
  Administered 2016-10-10: 4 [IU] via SUBCUTANEOUS
  Administered 2016-10-10: 7 [IU] via SUBCUTANEOUS
  Administered 2016-10-11: 4 [IU] via SUBCUTANEOUS
  Administered 2016-10-11: 11 [IU] via SUBCUTANEOUS
  Administered 2016-10-11 (×2): 7 [IU] via SUBCUTANEOUS
  Administered 2016-10-11: 11 [IU] via SUBCUTANEOUS
  Administered 2016-10-11 – 2016-10-12 (×2): 7 [IU] via SUBCUTANEOUS
  Administered 2016-10-12: 4 [IU] via SUBCUTANEOUS
  Administered 2016-10-12: 11 [IU] via SUBCUTANEOUS
  Administered 2016-10-12 (×3): 4 [IU] via SUBCUTANEOUS
  Administered 2016-10-13: 7 [IU] via SUBCUTANEOUS
  Administered 2016-10-13: 3 [IU] via SUBCUTANEOUS
  Administered 2016-10-13: 4 [IU] via SUBCUTANEOUS
  Administered 2016-10-13 – 2016-10-14 (×3): 3 [IU] via SUBCUTANEOUS
  Administered 2016-10-14: 7 [IU] via SUBCUTANEOUS
  Administered 2016-10-14 – 2016-10-15 (×5): 4 [IU] via SUBCUTANEOUS
  Administered 2016-10-15: 7 [IU] via SUBCUTANEOUS
  Administered 2016-10-15: 11 [IU] via SUBCUTANEOUS
  Administered 2016-10-15 – 2016-10-16 (×3): 4 [IU] via SUBCUTANEOUS
  Administered 2016-10-16 (×3): 7 [IU] via SUBCUTANEOUS
  Administered 2016-10-16: 4 [IU] via SUBCUTANEOUS
  Administered 2016-10-17: 7 [IU] via SUBCUTANEOUS
  Administered 2016-10-17: 4 [IU] via SUBCUTANEOUS
  Administered 2016-10-17: 3 [IU] via SUBCUTANEOUS
  Administered 2016-10-17: 4 [IU] via SUBCUTANEOUS
  Administered 2016-10-18: 7 [IU] via SUBCUTANEOUS
  Administered 2016-10-18: 4 [IU] via SUBCUTANEOUS
  Administered 2016-10-18: 3 [IU] via SUBCUTANEOUS
  Administered 2016-10-18 (×2): 4 [IU] via SUBCUTANEOUS
  Administered 2016-10-18: 3 [IU] via SUBCUTANEOUS
  Administered 2016-10-19: 4 [IU] via SUBCUTANEOUS
  Administered 2016-10-19 (×2): 7 [IU] via SUBCUTANEOUS
  Administered 2016-10-19 (×2): 4 [IU] via SUBCUTANEOUS
  Administered 2016-10-19: 7 [IU] via SUBCUTANEOUS
  Administered 2016-10-20: 4 [IU] via SUBCUTANEOUS
  Administered 2016-10-20: 7 [IU] via SUBCUTANEOUS
  Administered 2016-10-20: 4 [IU] via SUBCUTANEOUS
  Administered 2016-10-20: 11 [IU] via SUBCUTANEOUS
  Filled 2016-09-30: qty 1

## 2016-09-30 MED ORDER — ROCURONIUM BROMIDE 50 MG/5ML IV SOLN
INTRAVENOUS | Status: AC | PRN
  Administered 2016-09-30: 100 mg via INTRAVENOUS

## 2016-09-30 MED ORDER — VANCOMYCIN HCL IN DEXTROSE 1-5 GM/200ML-% IV SOLN
1000.0000 mg | Freq: Once | INTRAVENOUS | Status: AC
  Administered 2016-09-30: 1000 mg via INTRAVENOUS
  Filled 2016-09-30: qty 200

## 2016-09-30 MED ORDER — LABETALOL HCL 5 MG/ML IV SOLN: 20.0000 mg | Freq: Once | INTRAVENOUS | Status: DC

## 2016-09-30 MED ORDER — ACETAMINOPHEN 650 MG RE SUPP
650.0000 mg | Freq: Four times a day (QID) | RECTAL | Status: DC | PRN
  Administered 2016-09-30 – 2016-10-05 (×3): 650 mg via RECTAL
  Filled 2016-09-30 (×3): qty 1

## 2016-09-30 MED ORDER — PROPOFOL 1000 MG/100ML IV EMUL
5.0000 ug/kg/min | INTRAVENOUS | Status: DC
  Administered 2016-09-30: 10 ug/kg/min via INTRAVENOUS

## 2016-09-30 MED ORDER — PROPOFOL 1000 MG/100ML IV EMUL
0.0000 ug/kg/min | INTRAVENOUS | Status: DC
  Administered 2016-09-30 – 2016-10-01 (×2): 25 ug/kg/min via INTRAVENOUS
  Administered 2016-10-02: 15 ug/kg/min via INTRAVENOUS
  Filled 2016-09-30 (×4): qty 100

## 2016-09-30 MED ORDER — BISACODYL 10 MG RE SUPP
10.0000 mg | Freq: Every day | RECTAL | Status: DC | PRN
  Filled 2016-09-30: qty 1

## 2016-09-30 MED ORDER — IPRATROPIUM-ALBUTEROL 0.5-2.5 (3) MG/3ML IN SOLN
3.0000 mL | Freq: Four times a day (QID) | RESPIRATORY_TRACT | Status: DC
  Administered 2016-09-30 – 2016-10-07 (×28): 3 mL via RESPIRATORY_TRACT
  Filled 2016-09-30 (×27): qty 3

## 2016-09-30 MED ORDER — DOCUSATE SODIUM 50 MG/5ML PO LIQD
100.0000 mg | Freq: Two times a day (BID) | ORAL | Status: DC | PRN
  Filled 2016-09-30: qty 10

## 2016-09-30 MED ORDER — HYDRALAZINE HCL 20 MG/ML IJ SOLN
10.0000 mg | INTRAMUSCULAR | Status: DC | PRN
  Administered 2016-09-30 – 2016-10-07 (×13): 10 mg via INTRAVENOUS
  Filled 2016-09-30 (×13): qty 1

## 2016-09-30 MED ORDER — STROKE: EARLY STAGES OF RECOVERY BOOK
Freq: Once | Status: DC
  Filled 2016-09-30: qty 1

## 2016-09-30 MED ORDER — FENTANYL 2500MCG IN NS 250ML (10MCG/ML) PREMIX INFUSION
25.0000 ug/h | INTRAVENOUS | Status: DC
  Administered 2016-09-30 – 2016-10-02 (×2): 50 ug/h via INTRAVENOUS
  Filled 2016-09-30 (×2): qty 250

## 2016-09-30 MED ORDER — PANTOPRAZOLE SODIUM 40 MG PO PACK
40.0000 mg | PACK | ORAL | Status: DC
  Administered 2016-09-30 – 2016-10-01 (×2): 40 mg
  Filled 2016-09-30 (×2): qty 20

## 2016-09-30 MED ORDER — IPRATROPIUM-ALBUTEROL 0.5-2.5 (3) MG/3ML IN SOLN
3.0000 mL | RESPIRATORY_TRACT | Status: DC | PRN
  Administered 2016-09-30: 3 mL via RESPIRATORY_TRACT
  Filled 2016-09-30 (×2): qty 3

## 2016-09-30 MED ORDER — MIDAZOLAM HCL 2 MG/2ML IJ SOLN: 1.0000 mg | INTRAMUSCULAR | Status: DC | PRN

## 2016-09-30 NOTE — ED Provider Notes (Signed)
[facility] DEPT Provider Note   CSN: [id number] Arrival date & time: 09/30/16  0907     History   Chief Complaint No chief complaint on file.   HPI [name] is a 75 y.o. female with a history of CAD, COPD, PAD, HTN, HLD, presents to the ED by EMS for evaluation of altered mental status with right sided hemineglect, last seen normal last night. On scene, she was found to be hypoxic to 88% on room air, on no home oxygen, in respiratory distress. She was placed on NRB and brought her sats up to the high 90's. Throughout transport she was not seen to move her right hemibody, was unable to speak or provide any further history.  Further history was obtained from family members including, no hx of prior CVA. No known falls, trauma, or recent illness. They states that she was feeling relatively normal yesterday. She reportedly did have a stressful episode at home a few days ago due to a home appliance malfunctioning, but has reportedly not been noting any recent chest pains, increased SOB, cough, fever, nausea, or vomiting.    The history is provided by the EMS personnel. The history is limited by the condition of the patient.    Past Medical History:  Diagnosis Date  . Anemia of chronic illness 11/06/2015  . Ankylosing spondylitis ([place])   . Anxiety   . Arthritis    "psorasic"  . Asthma   . Barrett esophagus   . Blood transfusion ~ 1962   "11 units of blood"  . Candida esophagitis ([place])   . Carotid artery disease (HCC)    dopplers showed mod L ICA stenosis   . Carpal tunnel syndrome on right   . Chronic back pain   . Chronic bronchitis ([place])    "since childhood" (12/06/2012)  . Chronic sinus infection   . Cirrhosis of liver without mention of alcohol   . COPD (chronic obstructive pulmonary disease) ([place])   . COPD (chronic obstructive pulmonary disease) ([place])   . Coronary artery disease   . Critical lower limb ischemia, with ulcer Lt foot 12/07/2012  . Dermatophytosis of  nail [id number]  . Diverticulosis   . DJD (degenerative joint disease) of knee    "both" (12/06/2012)  . Dysrhythmia    "palpitations"  . Esophageal dilatation   . Exertional dyspnea   . External bleeding hemorrhoids   . GERD (gastroesophageal reflux disease)   . GI bleed   . Gout   . Heart murmur   . Hiatal hernia   . History of nuclear stress test 11/2012   lexiscan; normal, low risk   . Hypercholesteremia   . Hypertension   . Iron deficiency anemia   . Myocardial infarction   . Non-healing ulcer of foot, secondary to diabetes and PVD 12/07/2012  . OA (osteoarthritis)   . PAD (peripheral artery disease) ([place])   . Pericardial effusion    moderate by 2-D echo without evidence of An odd  . Peripheral vascular disease ([place])   . Pneumonia 10/07/11; 2006; 1980's  . Psoriasis   . Psoriasis   . Psoriatic arthritis ([place])   . PVD (peripheral vascular disease) ([place]) 12/07/2012  . Sinus headache   . Tobacco abuse 12/07/2012  . Type II diabetes mellitus [facility])     Patient Active Problem List   Diagnosis Date Noted  . CVA (cerebral vascular accident) ([place]) 09/30/2016  . Allergic sinusitis 02/26/2016  . Anemia of chronic illness 11/06/2015  . Mass of  breast, left 08/19/2015  . Angiodysplasia of intestine with hemorrhage   . Angiodysplasia of duodenum with hemorrhage   . Left-sided carotid artery disease ([place]) 11/11/2014  . SOB (shortness of breath) 07/20/2014  . Pericardial effusion 07/20/2014  . Deficiency anemia 05/09/2014  . Heme positive stool 05/09/2014  . Symptomatic anemia 05/09/2014  . CAD S/P percutaneous coronary angioplasty 05/02/2014  . Cardiomyopathy, ischemic 05/02/2014  . Hyperlipidemia with target LDL less than 70 05/02/2014  . Hypotension 04/17/2014  . UTI (urinary tract infection) 04/16/2014  . Pulmonary hypertension 04/10/2014  . Constipation 04/09/2014  . STEMI (ST elevation myocardial infarction) ([place]) 04/06/2014  . Acute respiratory failure with hypoxia ([place])  04/05/2014  . Acute on chronic combined systolic and diastolic CHF (congestive heart failure) ([place]) 03/18/2014  . GI bleeding 03/16/2014  . Abdominal distention 03/16/2014  . Chest pain with moderate risk of acute coronary syndrome 03/16/2014  . GI bleed 03/16/2014  . Acute exacerbation of chronic obstructive pulmonary disease (COPD) ([place]) 11/26/2013  . Barrett esophagus 12/11/2012  . Psoriatic arthritis ([place])   . Essential hypertension 04/30/2010  . Iron deficiency anemia 02/17/2010  . DIABETES-TYPE 2 08/05/2009  . GERD 08/05/2009    Past Surgical History:  Procedure Laterality Date  . ABDOMINAL HYSTERECTOMY  1976  . ANGIOPLASTY  12/10/2012   "LLE"; LSFA [place] orbital rotational arthrectomy, PTA , stenting with IDEV stent (left ABI improved from 0.35 to 0.75)  . APPENDECTOMY    . ATHERECTOMY N/A 12/10/2012   Procedure: ATHERECTOMY;  Surgeon: Lorretta Harp, MD;  Location: [facility] CATH LAB;  Service: Cardiovascular;  Laterality: N/A;  . BREAST LUMPECTOMY Left 10/06/2015   Procedure: LEFT BREAST LUMPECTOMY;  Surgeon: Donnie Mesa, MD;  Location: [facility];  Service: General;  Laterality: Left;  . CARDIAC CATHETERIZATION N/A 09/21/2015   Procedure: Left Heart Cath and Coronary Angiography;  Surgeon: Lorretta Harp, MD;  Location: [facility] CV LAB;  Service: Cardiovascular;  Laterality: N/A;  . CARPAL TUNNEL RELEASE  2011   left hand  . CATARACT EXTRACTION W/ INTRAOCULAR LENS IMPLANT  2009/2010   right/left  . COLONOSCOPY  2007   Hemorrhoids and Diverticulosis  . CORONARY ANGIOPLASTY     x2  . ENTEROSCOPY N/A 04/14/2015   Procedure: ENTEROSCOPY;  Surgeon: Irene Shipper, MD;  Location: [facility] ENDOSCOPY;  Service: Endoscopy;  Laterality: N/A;  . ESOPHAGOGASTRODUODENOSCOPY  2011  . ESOPHAGOGASTRODUODENOSCOPY N/A 03/17/2014   Procedure: ESOPHAGOGASTRODUODENOSCOPY (EGD);  Surgeon: Irene Shipper, MD;  Location: [facility] ENDOSCOPY;  Service: Endoscopy;  Laterality: N/A;  . EYE SURGERY    . LEFT  HEART CATHETERIZATION WITH CORONARY ANGIOGRAM N/A 04/09/2014   Procedure: LEFT HEART CATHETERIZATION WITH CORONARY ANGIOGRAM;  Surgeon: Sinclair Grooms, MD;  Location: [facility] CATH LAB;  Service: Cardiovascular;  Laterality: N/A;  . LIPOMA EXCISION  2010   "left occipital area"  . LOWER EXTREMITY ANGIOGRAM N/A 12/06/2012   Procedure: LOWER EXTREMITY ANGIOGRAM;  Surgeon: Lorretta Harp, MD;  Location: [facility] CATH LAB;  Service: Cardiovascular;  Laterality: N/A;  . NASAL SINUS SURGERY  09/29/2009  . OOPHORECTOMY     'not sure which one"  . PERCUTANEOUS CORONARY ROTOBLATOR INTERVENTION (PCI-R) N/A 04/16/2014   Procedure: PERCUTANEOUS CORONARY ROTOBLATOR INTERVENTION (PCI-R);  Surgeon: Blane Ohara, MD;  Location: [facility] CATH LAB;  Service: Cardiovascular;  Laterality: N/A;  . TRANSMETATARSAL AMPUTATION Left 01/08/2013   Procedure: TRANSMETATARSAL AMPUTATION;  Surgeon: Angelia Mould, MD;  Location: [facility];  Service: Vascular;  Laterality: Left;  OB History    No data available       Home Medications    Prior to Admission medications   Medication Sig Start Date End Date Taking? Authorizing Provider  ADVAIR HFA 115-21 MCG/ACT inhaler Inhale 2 puffs into the lungs daily. 09/01/15   Historical Provider, MD  albuterol (PROVENTIL HFA;VENTOLIN HFA) 108 (90 BASE) MCG/ACT inhaler Inhale 1 puff into the lungs every 6 (six) hours as needed for shortness of breath.     Historical Provider, MD  ALPRAZolam Duanne Moron) 0.5 MG tablet Take 0.25 mg by mouth 2 (two) times daily as needed for anxiety or sleep.    Historical Provider, MD  ammonium lactate (LAC-HYDRIN) 12 % lotion Apply 1 application topically daily as needed for dry skin (psorias).     Historical Provider, MD  Artificial Tear Ointment (LUBRICANT EYE OP) Place 1 drop into both eyes as needed (for dry eyes).    Historical Provider, MD  aspirin 81 MG tablet Take 81 mg by mouth daily.    Historical Provider, MD  carvedilol (COREG) 3.125 MG tablet Take 1  tablet by mouth 2  times daily with  meals 07/15/16   Lorretta Harp, MD  Cholecalciferol (VITAMIN D) 2000 UNITS CAPS Take 2,000 Units by mouth every morning.     Historical Provider, MD  dorzolamide (TRUSOPT) 2 % ophthalmic solution Place 1 drop into the right eye 2 (two) times daily.    Historical Provider, MD  esomeprazole (NEXIUM) 40 MG capsule Take 40 mg by mouth daily before breakfast.    Historical Provider, MD  feeding supplement, GLUCERNA SHAKE, (GLUCERNA SHAKE) LIQD Take 237 mLs by mouth daily at 3 pm.     Historical Provider, MD  ferrous sulfate 325 (65 FE) MG tablet Take 1 tablet (325 mg total) by mouth 2 (two) times daily with a meal. 03/21/14   Bobby Rumpf [place], PA-C  fluticasone (FLONASE) 50 MCG/ACT nasal spray Place 1 spray into both nostrils at bedtime as needed for allergies.     Historical Provider, MD  furosemide (LASIX) 40 MG tablet Take 1 tablet (40 mg total) by mouth daily. 07/07/14   Lorretta Harp, MD  glimepiride (AMARYL) 2 MG tablet Take 2 mg by mouth every morning.  10/21/14   Historical Provider, MD  ipratropium-albuterol (DUONEB) 0.5-2.5 (3) MG/3ML SOLN Take 3 mLs by nebulization every 4 (four) hours as needed (shortness of breath).    Historical Provider, MD  Liniments ([employer] ARTHRITIS PAIN RELIEVER EX) Apply 1 application topically 2 (two) times daily as needed (for shoulder pains, knee, back.).     Historical Provider, MD  metFORMIN (GLUCOPHAGE) 500 MG tablet Take 500 mg by mouth daily after supper. Pt. Told that she is  only to take only as needed , for a blood sugar of 250 or greater    Historical Provider, MD  Multiple Vitamins-Minerals (ICAPS) CAPS Take 1 capsule by mouth every morning.     Historical Provider, MD  ONE TOUCH ULTRA TEST test strip 1 each by Other route See admin instructions. Check blood sugar twice daily 06/03/15   Historical Provider, MD  pravastatin (PRAVACHOL) 10 MG tablet Take 10 mg by mouth 2 (two) times a week. Monday  and Thursday    Historical  Provider, MD  predniSONE (DELTASONE) 5 MG tablet Take 5 mg by mouth every Monday, Wednesday, and Friday.     Historical Provider, MD  ranitidine (ZANTAC) 150 MG capsule Take 150 mg by mouth at bedtime.  02/20/14  Historical Provider, MD  simethicone (MYLICON) 80 MG chewable tablet Chew 1 tablet (80 mg total) by mouth 4 (four) times daily as needed for flatulence. 12/04/13   Robbie Lis, MD  Travoprost, BAK Free, (TRAVATAMN) 0.004 % SOLN ophthalmic solution Place 1 drop into both eyes at bedtime.     Historical Provider, MD    Family History Family History  Problem Relation Age of Onset  . Cancer Mother   . Lupus Sister     x2  . Lung cancer Sister   . Thyroid disease Sister   . Heart Problems Maternal Grandmother   . Cancer Paternal Grandfather   . Diabetes Brother     x2  . Arthritis Brother     x3  . Arthritis Sister     x3  . Colon cancer Neg Hx     Social History Social History  Substance Use Topics  . Smoking status: Former Smoker    Packs/day: 1.00    Years: 40.00    Types: Cigarettes  . Smokeless tobacco: Former Systems developer    Quit date: 09/28/2013     Comment: pt states that she smokes "every once in a while"  . Alcohol use No     Comment: "quit social drinking years ago"     Allergies   Amlodipine besy-benazepril hcl; Statins; Tradjenta [linagliptin]; Plavix [clopidogrel]; and Raptiva [efalizumab]   Review of Systems Review of Systems  Unable to perform ROS: Acuity of condition     Physical Exam Updated Vital Signs BP 172/70   Pulse (!) 52   Temp 101.1 F (38.4 C)   Resp 20   Ht 5' 7"  (1.702 m)   Wt 80.7 kg   SpO2 100%   BMI 27.88 kg/m   Physical Exam  Constitutional: She appears well-developed and well-nourished. She appears distressed.  HENT:  Head: Normocephalic and atraumatic.  Nose: Nose normal.  Mouth/Throat: Oropharynx is clear and moist.  Eyes: Conjunctivae are normal. Pupils are equal, round, and reactive to light.  Neck: Normal range  of motion. Neck supple.  Cardiovascular: Regular rhythm and normal heart sounds.  Tachycardia present.   Pulmonary/Chest: Accessory muscle usage present. Tachypnea noted. She is in respiratory distress. She has decreased breath sounds. She has wheezes in the right lower field and the left lower field.  Abdominal: Soft. She exhibits no distension. There is no tenderness.  Musculoskeletal:  S/p amputation of distal left foot  Neurological: She is unresponsive. GCS eye subscore is 2. GCS verbal subscore is 1. GCS motor subscore is 4.  No noted movement and marked weakness of her right hemibody. Intermittent movements of her left hemibody noted with no clear purposeful movements. Unable to speak.   Skin: Skin is warm and dry. She is not diaphoretic.  Nursing note and vitals reviewed.    ED Treatments / Results  Labs (all labs ordered are listed, but only abnormal results are displayed) Labs Reviewed  TRIGLYCERIDES - Abnormal; Notable for the following:       Result Value   Triglycerides 159 (*)    All other components within normal limits  COMPREHENSIVE METABOLIC PANEL - Abnormal; Notable for the following:    Glucose, Bld 270 (*)    BUN 27 (*)    Creatinine, Ser 1.45 (*)    Albumin 3.3 (*)    GFR calc non Af Amer 34 (*)    GFR calc Af Amer 39 (*)    All other components within normal limits  CBC WITH  DIFFERENTIAL/PLATELET - Abnormal; Notable for the following:    WBC 14.0 (*)    RBC 3.80 (*)    Hemoglobin 11.5 (*)    HCT 35.4 (*)    Neutro Abs 12.3 (*)    All other components within normal limits  URINALYSIS, ROUTINE W REFLEX MICROSCOPIC (NOT AT [facility]) - Abnormal; Notable for the following:    Glucose, UA 100 (*)    Hgb urine dipstick SMALL (*)    Protein, ur 100 (*)    All other components within normal limits  LACTIC ACID, PLASMA - Abnormal; Notable for the following:    Lactic Acid, Venous 2.2 (*)    All other components within normal limits  BRAIN NATRIURETIC PEPTIDE -  Abnormal; Notable for the following:    B Natriuretic Peptide 737.8 (*)    All other components within normal limits  URINE MICROSCOPIC-ADD ON - Abnormal; Notable for the following:    Squamous Epithelial / LPF 0-5 (*)    Bacteria, UA RARE (*)    All other components within normal limits  I-STAT ARTERIAL BLOOD GAS, ED - Abnormal; Notable for the following:    pH, Arterial 7.320 (*)    pCO2 arterial 54.5 (*)    pO2, Arterial 427.0 (*)    All other components within normal limits  I-STAT CG4 LACTIC ACID, ED - Abnormal; Notable for the following:    Lactic Acid, Venous 2.05 (*)    All other components within normal limits  I-STAT CHEM 8, ED - Abnormal; Notable for the following:    BUN 30 (*)    Creatinine, Ser 1.40 (*)    Glucose, Bld 266 (*)    All other components within normal limits  I-STAT CG4 LACTIC ACID, ED - Abnormal; Notable for the following:    Lactic Acid, Venous 1.92 (*)    All other components within normal limits  CULTURE, BLOOD (ROUTINE X 2)  CULTURE, BLOOD (ROUTINE X 2)  URINE CULTURE  LACTIC ACID, PLASMA  LIPASE, BLOOD  PROCALCITONIN  PROTIME-INR  APTT  TRIGLYCERIDES  I-STAT TROPOININ, ED  TYPE AND SCREEN    EKG  EKG Interpretation  Date/Time:  Friday September 30 2016 09:20:30 EDT Ventricular Rate:  75 PR Interval:    QRS Duration: 78 QT Interval:  399 QTC Calculation: 446 R Axis:   -31 Text Interpretation:  Sinus rhythm LVH with secondary repolarization abnormality Anterior Q waves, possibly due to LVH No significant change since last tracing Confirmed by YAO  MD, DAVID ([id number]) on 09/30/2016 10:45:32 AM       Radiology Ct Head Wo Contrast  Result Date: 09/30/2016 CLINICAL DATA:  Right-sided weakness EXAM: CT HEAD WITHOUT CONTRAST TECHNIQUE: Contiguous axial images were obtained from the base of the skull through the vertex without intravenous contrast. COMPARISON:  MRI 07/24/2009 FINDINGS: Brain: Acute infarct in the left MCA territory.  Low-density is seen in the insula extending into the left frontal temporal lobe and left parietal lobe. There is hemorrhagic transformation with blood in the left frontal temporal lobe and in the left parietal lobe. Generalized atrophy. Negative for hydrocephalus. Low-density in the parietal white matter bilaterally appears chronic. Brainstem and cerebellum intact. Vascular: Hyperdense vessel in the sylvian fissure compatible with thrombosed left MCA branch leading to acute infarct. M1 does not appear hyperdense. Skull: Negative Sinuses/Orbits: Mucosal edema in the paranasal sinuses. NG tube in place. Normal orbit. 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 No shift of the midline structures. Atrophy and  chronic ischemic change in the white matter bilaterally These results were called by telephone at the time of interpretation on 09/30/2016 at 11:51 am to Dr. Rockne Menghini , who verbally acknowledged these results. Electronically Signed   By: Franchot Gallo M.D.   On: 09/30/2016 11:52   Dg Chest Portable 1 View  Result Date: 09/30/2016 CLINICAL DATA:  Hypoxia with endotracheal tube adjustment EXAM: PORTABLE CHEST 1 VIEW COMPARISON:  Study obtained earlier in the day FINDINGS: Endotracheal tube tip is now 3.6 cm above the carina. No pneumothorax. There is airspace consolidation in the left base laterally. Lungs elsewhere are clear. Heart is mildly enlarged with pulmonary vascularity within normal limits. No adenopathy. There is atherosclerotic calcification in the aorta. No evident bone lesions. IMPRESSION: Endotracheal tube as described without pneumothorax. Left base airspace consolidation consistent with pneumonia. Lungs elsewhere clear. Stable cardiac silhouette. There is aortic atherosclerosis. Electronically Signed   By: Lowella Grip III M.D.   On: 09/30/2016 09:36   Dg Chest Portable 1 View  Result Date: 09/30/2016 CLINICAL DATA:  75 year old female found  unresponsive this morning. Intubated. Initial encounter. EXAM: PORTABLE CHEST 1 VIEW COMPARISON:  09/16/2015 and earlier. FINDINGS: Portable AP semi upright view at 0915 hours. Endotracheal tube tip in good position about 2.5 cm above the carina. Enteric tube placed, tip not definitely included. Somewhat low lung volumes. Mild patchy opacity at the left lung base. Stable cardiomegaly and mediastinal contours. Calcified aortic atherosclerosis. No pneumothorax, pulmonary edema, or definite pleural effusion. IMPRESSION: 1. ET tube in good position. Enteric tube courses to the left abdomen. 2. Lower lung volumes. Patchy left lung base opacity could reflect atelectasis, but consider also aspiration or bronchopneumonia in this setting. 3.  Calcified aortic atherosclerosis. Electronically Signed   By: Genevie Ann M.D.   On: 09/30/2016 09:34   Ct Angio Chest/abd/pel For Dissection W And/or Wo Contrast  Result Date: 09/30/2016 CLINICAL DATA:  Respiratory distress, right-sided weakness EXAM: CT ANGIOGRAPHY CHEST, ABDOMEN AND PELVIS TECHNIQUE: Multidetector CT imaging through the chest, abdomen and pelvis was performed using the standard protocol during bolus administration of intravenous contrast. Multiplanar reconstructed images and MIPs were obtained and reviewed to evaluate the vascular anatomy. CONTRAST:  100 mL Isovue 370 COMPARISON:  None. FINDINGS: CTA CHEST FINDINGS Cardiovascular: Preferential opacification of the thoracic aorta. No evidence of thoracic aortic aneurysm or dissection. Thoracic aortic atherosclerosis major vessels arising from the aortic arch are patent. Bilateral vertebral arteries are patent. Dense coronary artery atherosclerosis in the LAD, left main, and circumflex. Normal heart size. No pericardial effusion. Mediastinum/Nodes: No enlarged mediastinal, hilar, or axillary lymph nodes. Thyroid gland, trachea, and esophagus demonstrate no significant findings. Lungs/Pleura: Bibasilar atelectasis.  Lingular airspace disease which may reflect atelectasis versus pneumonia. No pleural effusion or pneumothorax. Musculoskeletal: No acute osseous abnormality. Degenerative disc disease throughout the thoracic spine. No aggressive lytic or sclerotic osseous lesion. Degenerative disc disease with disc space narrowing of the lower cervical spine. Review of the MIP images confirms the above findings. CTA ABDOMEN AND PELVIS FINDINGS VASCULAR Aorta: Normal caliber aorta without aneurysm, dissection, vasculitis or significant stenosis. Abdominal aortic atherosclerosis. Celiac: Patent without evidence of aneurysm, dissection, vasculitis or significant stenosis. Mild atherosclerotic plaque at the origin. SMA: Patent without evidence of aneurysm, dissection, vasculitis or significant stenosis. Mild atherosclerosis at the origin and slightly more distally. Renals: Both renal arteries are patent without evidence of aneurysm, dissection, vasculitis, fibromuscular dysplasia or significant stenosis. IMA: Patent without evidence of aneurysm, dissection, vasculitis or significant stenosis.  Veins: No obvious venous abnormality within the limitations of this arterial phase study. Iliacs: Atherosclerotic disease involving bilateral common iliac arteries extending to the external iliac arteries. 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 Review of the MIP images confirms the above findings. NON-VASCULAR Hepatobiliary: 10 mm hypodensity in the right hepatic lobe of indeterminate etiology likely representing a small cyst unchanged from 05/20/2014. Liver is otherwise unremarkable. No gallstones, gallbladder wall thickening, or biliary dilatation. Pancreas: Unremarkable. No pancreatic ductal dilatation or surrounding inflammatory changes.  Spleen: Normal in size without focal abnormality. Adrenals/Urinary Tract: Adrenal glands are unremarkable. Kidneys are normal, without renal calculi, focal lesion, or hydronephrosis. Bladder is decompressed with a Foley catheter present. Stomach/Bowel: Stomach is within normal limits. Sigmoid diverticulosis without evidence of diverticulitis. Appendix appears normal. No evidence of bowel wall thickening, distention, or inflammatory changes. Lymphatic:  No lymphandeopathy. Reproductive: Status post hysterectomy. No adnexal masses. Other: Fat containing umbilical hernia.  No abdominopelvic ascites. Musculoskeletal: Nondisplaced fracture of the right superior and inferior pubic rami. Incidentally noted, severe radiocarpal and first [facility] joint osteoarthritis bilaterally. Review of the MIP images confirms the above findings. IMPRESSION: 1. No aortic aneurysm or dissection. 2. Diverticulosis without evidence diverticulitis. 3. Nondisplaced fracture of the right superior and inferior pubic rami. 4. 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 5.  Aortic Atherosclerosis (ICD10-170.0) 6. Severe coronary artery atherosclerosis. 7. Lingular airspace disease which may reflect atelectasis versus pneumonia. Electronically Signed   By: Kathreen Devoid   On: 09/30/2016 12:06    Procedures Procedure Name: Intubation Date/Time: 09/30/2016 9:37 AM Performed by: Zenovia Jarred Pre-anesthesia Checklist: Patient identified, Emergency Drugs available, Suction available, Patient being monitored and Timeout performed Oxygen Delivery Method: Non-rebreather mask Preoxygenation: Pre-oxygenation with 100% oxygen Intubation Type: Rapid sequence Ventilation: Mask ventilation without difficulty Laryngoscope Size: 4 Grade View: Grade  I Tube size: 7.5 mm Number of attempts: 1 Airway Equipment and Method: Video-laryngoscopy Placement Confirmation: ETT inserted through vocal cords under direct vision,  Positive ETCO2,  Breath sounds checked- equal and bilateral and CO2 detector Secured at: 23 cm Tube secured with: ETT holder        (including critical care time)  Medications Ordered in ED Medications  propofol (DIPRIVAN) 1000 MG/100ML infusion (not administered)   stroke: mapping our early stages of recovery book (not administered)  0.9 %  sodium chloride infusion ( Intravenous New Bag/Given 09/30/16 1447)  chlorhexidine gluconate (MEDLINE KIT) (PERIDEX) 0.12 % solution 15 mL (not administered)  MEDLINE mouth rinse (not administered)  fentaNYL 2540mg in NS 2536m(1059mml) infusion-PREMIX (not administered)  fentaNYL (SUBLIMAZE) bolus via infusion 25 mcg (not administered)  propofol (DIPRIVAN) 1000 MG/100ML infusion (not administered)  midazolam (VERSED) injection 1 mg (not administered)  docusate (COLACE) 50 MG/5ML liquid 100 mg (not administered)  bisacodyl (DULCOLAX) suppository 10 mg (not administered)  pantoprazole sodium (PROTONIX) 40 mg/20 mL oral suspension 40 mg (not administered)  labetalol (NORMODYNE,TRANDATE) injection 10 mg (not administered)  hydrALAZINE (APRESOLINE) injection 10 mg (not administered)  insulin aspart (novoLOG) injection 0-20 Units (not administered)  ipratropium-albuterol (DUONEB) 0.5-2.5 (3) MG/3ML nebulizer solution 3 mL (not administered)  budesonide (PULMICORT) nebulizer solution  0.5 mg (not administered)  etomidate (AMIDATE) injection (20 mg Intravenous Given 09/30/16 0913)  rocuronium (ZEMURON) injection (100 mg Intravenous Given 09/30/16 0913)  methylPREDNISolone sodium succinate (SOLU-MEDROL) 125 mg/2 mL injection 125 mg (125 mg Intravenous Given 09/30/16 1005)  vancomycin (VANCOCIN) IVPB 1000 mg/200 mL premix (0 mg Intravenous Stopped 09/30/16 1253)  piperacillin-tazobactam  (ZOSYN) IVPB 3.375 g (0 g Intravenous Stopped 09/30/16 1149)  labetalol (NORMODYNE,TRANDATE) injection 20 mg (20 mg Intravenous Given 09/30/16 1014)  iopamidol (ISOVUE-370) 76 % injection (100 mLs  Contrast Given 09/30/16 1121)     Initial Impression / Assessment and Plan / ED Course  I have reviewed the triage vital signs and the nursing notes.  Pertinent labs & imaging results that were available during my care of the patient were reviewed by me and considered in my medical decision making (see chart for details).  Clinical Course   75 y.o. female presents to the ED with acute hypoxic respiratory failure and altered mental status with right-sided hemi-neglect concerning for CVA. Initial GCS was low and concern for inability to protect airway, so the patient was intubated shortly after arriving to the ED, as described above.  Patient's vital signs were significant for tachycardia and hypertension with systolics over 454. Concern raised for acute intracranial hemorrhage, versus dissection precipitating CVA.   Chest x-ray was done and showed evidence of pneumonia, in LLL. ETT appropriately positioned.   She was given vancomycin and Zosyn given PNA and critical illness.  Given solumedrol and duonebs with COPD and   ABG shows evidence of hypercapnia and acidosis. Labs show evidence of leukocytosis of 14, lactic acidosis of 2.2, Cr 1.45, BUN 27, BNP 737, neg troponin. Lactate cleared to 1.9   CT head and CT chest were ordered to further evaluate and returned showing acute left MCA infarct with hemorrhagic transformation but no midline shift. No aneurysm or dissection was noted. Incidentally nondisplaced fracture of the right superior and inferior pubic rami were noted.  Neuro was consulted and agreed to see the patient at the bedside for evaluation of CVA. She was given labetalol for blood pressure mgmt.  Ortho was consulted and agreed to see the patient at the bedside.   She was then admitted  to critical care in the ICU for further care and assessment.  Final Clinical Impressions(s) / ED Diagnoses   Final diagnoses:  Acute respiratory failure with hypoxia and hypercapnia (HCC)  Pneumonia of left lower lobe due to infectious organism [facility])  Arterial ischemic stroke, MCA (middle cerebral artery), left, acute ([place])  Hemorrhagic stroke [facility])    New Prescriptions New Prescriptions   No medications on file     Zenovia Jarred, DO 09/30/16 [place] Yao, MD 10/03/16 [phone]

## 2016-09-30 NOTE — ED Notes (Signed)
Family at bedside. 

## 2016-09-30 NOTE — Consult Note (Signed)
ORTHOPAEDIC CONSULTATION  REQUESTING PHYSICIAN: Chesley Mires, MD  PCP:  Leola Brazil, MD  Chief Complaint: Right LC 1 pelvis fracture  HPI: [name] is a 75 y.o. female who is intubated and sedated in the emergency department. Apparently she was found by family. She was making gurgling noises and not moving her right arm. She was brought to the emergency department. She was intubated for respiratory distress. A CT scan was obtained of the chest, abdomen, and pelvis showing nondisplaced right superior and inferior rami fractures. She does have a left-sided MCA stroke. Orthopedic consultation was obtained for management of her right-sided pelvic fractures. 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 The patient lives with family. Apparently she is a household ambulator with a walker. History was obtained from the chart and from the patient's family who is at bedside.  Past Medical History:  Diagnosis Date  . Anemia of chronic illness 11/06/2015  . Ankylosing spondylitis ([place])   . Anxiety   . Arthritis    "psorasic"  . Asthma   . Barrett esophagus   . Blood transfusion ~ 1962   "11 units of blood"  . Candida esophagitis ([place])   . Carotid artery disease (HCC)    dopplers showed mod L ICA stenosis   . Carpal tunnel syndrome on right   . Chronic back pain   . Chronic bronchitis ([place])    "since childhood" (12/06/2012)  . Chronic sinus infection   . Cirrhosis of liver without mention of alcohol   . COPD (chronic obstructive pulmonary disease) ([place])   . COPD (chronic obstructive pulmonary disease) ([place])   . Coronary artery disease   . Critical lower limb ischemia, with ulcer Lt foot 12/07/2012  . Dermatophytosis of nail [id number]  . Diverticulosis   . DJD (degenerative joint disease) of knee    "both" (12/06/2012)  . Dysrhythmia    "palpitations"  . Esophageal dilatation   . Exertional dyspnea   . External bleeding hemorrhoids   . GERD  (gastroesophageal reflux disease)   . GI bleed   . Gout   . Heart murmur   . Hiatal hernia   . History of nuclear stress test 11/2012   lexiscan; normal, low risk   . Hypercholesteremia   . Hypertension   . Iron deficiency anemia   . Myocardial infarction   . Non-healing ulcer of foot, secondary to diabetes and PVD 12/07/2012  . OA (osteoarthritis)   . PAD (peripheral artery disease) ([place])   . Pericardial effusion    moderate by 2-D echo without evidence of An odd  . Peripheral vascular disease ([place])   . Pneumonia 10/07/11; 2006; 1980's  . Psoriasis   . Psoriasis   . Psoriatic arthritis ([place])   . PVD (peripheral vascular disease) ([place]) 12/07/2012  . Sinus headache   . Tobacco abuse 12/07/2012  . Type II diabetes mellitus ([place])    Past Surgical History:  Procedure Laterality Date  . ABDOMINAL HYSTERECTOMY  1976  . ANGIOPLASTY  12/10/2012   "LLE"; LSFA [place] orbital rotational arthrectomy, PTA , stenting with IDEV stent (left ABI improved from 0.35 to 0.75)  . APPENDECTOMY    . ATHERECTOMY N/A 12/10/2012   Procedure: ATHERECTOMY;  Surgeon: Lorretta Harp, MD;  Location: [facility] CATH LAB;  Service: Cardiovascular;  Laterality: N/A;  . BREAST LUMPECTOMY Left 10/06/2015   Procedure: LEFT BREAST LUMPECTOMY;  Surgeon: Donnie Mesa, MD;  Location: [place];  Service: General;  Laterality: Left;  . CARDIAC CATHETERIZATION N/A 09/21/2015   Procedure: Left Heart Cath and Coronary Angiography;  Surgeon: Lorretta Harp, MD;  Location: [place] CV LAB;  Service: Cardiovascular;  Laterality: N/A;  . CARPAL TUNNEL RELEASE  2011   left hand  . CATARACT EXTRACTION W/ INTRAOCULAR LENS IMPLANT  2009/2010   right/left  . COLONOSCOPY  2007   Hemorrhoids and Diverticulosis  . CORONARY ANGIOPLASTY     x2  . ENTEROSCOPY N/A 04/14/2015   Procedure: ENTEROSCOPY;  Surgeon: Irene Shipper, MD;  Location: [facility] ENDOSCOPY;  Service: Endoscopy;  Laterality: N/A;  . ESOPHAGOGASTRODUODENOSCOPY  2011  .  ESOPHAGOGASTRODUODENOSCOPY N/A 03/17/2014   Procedure: ESOPHAGOGASTRODUODENOSCOPY (EGD);  Surgeon: Irene Shipper, MD;  Location: [facility] ENDOSCOPY;  Service: Endoscopy;  Laterality: N/A;  . EYE SURGERY    . LEFT HEART CATHETERIZATION WITH CORONARY ANGIOGRAM N/A 04/09/2014   Procedure: LEFT HEART CATHETERIZATION WITH CORONARY ANGIOGRAM;  Surgeon: Sinclair Grooms, MD;  Location: [facility] CATH LAB;  Service: Cardiovascular;  Laterality: N/A;  . LIPOMA EXCISION  2010   "left occipital area"  . LOWER EXTREMITY ANGIOGRAM N/A 12/06/2012   Procedure: LOWER EXTREMITY ANGIOGRAM;  Surgeon: Lorretta Harp, MD;  Location: [facility] CATH LAB;  Service: Cardiovascular;  Laterality: N/A;  . NASAL SINUS SURGERY  09/29/2009  . OOPHORECTOMY     'not sure which one"  . PERCUTANEOUS CORONARY ROTOBLATOR INTERVENTION (PCI-R) N/A 04/16/2014   Procedure: PERCUTANEOUS CORONARY ROTOBLATOR INTERVENTION (PCI-R);  Surgeon: Blane Ohara, MD;  Location: [facility] CATH LAB;  Service: Cardiovascular;  Laterality: N/A;  . TRANSMETATARSAL AMPUTATION Left 01/08/2013   Procedure: TRANSMETATARSAL AMPUTATION;  Surgeon: Angelia Mould, MD;  Location: [facility] OR;  Service: Vascular;  Laterality: Left;   Social History   Social History  . Marital status: Widowed    Spouse name: N/A  . Number of children: 0  . Years of education: N/A   Occupational History  . 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 Smoking status: Former Smoker    Packs/day: 1.00    Years: 40.00    Types: Cigarettes  . Smokeless tobacco: Former Systems developer    Quit date: 09/28/2013     Comment: pt states that she smokes "every once in a while"  . Alcohol use No     Comment: "quit social drinking years ago"  . Drug use: No  . Sexual activity: No   Other Topics Concern  . None   Social History Narrative  . None   Family History  Problem Relation Age of Onset  . Cancer Mother   . Lupus Sister     x2  . Lung cancer Sister   . Thyroid  disease Sister   . Heart Problems Maternal Grandmother   . Cancer Paternal Grandfather   . Diabetes Brother     x2  . Arthritis Brother     x3  . Arthritis Sister     x3  . Colon cancer Neg Hx    Allergies  Allergen Reactions  . Amlodipine Besy-Benazepril Hcl Other (See Comments)    Nervous/shakiness  . Statins Other (See Comments)    "can't take any of them; cramp me up" (12/06/2012)  . Tradjenta [Linagliptin] Other (See Comments)    Extreme muscle pains, chest and back pains  . Plavix [Clopidogrel] Rash and Other (See Comments)    Severe burning of the skin.Pt states that the doctor told her that her rash was  psoriasis and not related to plavix but she states the rash came from the plavix  . Raptiva [Efalizumab] Rash   Prior to Admission medications   Medication Sig Start Date End Date Taking? Authorizing Provider  ADVAIR HFA 115-21 MCG/ACT inhaler Inhale 2 puffs into the lungs daily. 09/01/15   Historical Provider, MD  albuterol (PROVENTIL HFA;VENTOLIN HFA) 108 (90 BASE) MCG/ACT inhaler Inhale 1 puff into the lungs every 6 (six) hours as needed for shortness of breath.     Historical Provider, MD  ALPRAZolam Duanne Moron) 0.5 MG tablet Take 0.25 mg by mouth 2 (two) times daily as needed for anxiety or sleep.    Historical Provider, MD  ammonium lactate (LAC-HYDRIN) 12 % lotion Apply 1 application topically daily as needed for dry skin (psorias).     Historical Provider, MD  Artificial Tear Ointment (LUBRICANT EYE OP) Place 1 drop into both eyes as needed (for dry eyes).    Historical Provider, MD  aspirin 81 MG tablet Take 81 mg by mouth daily.    Historical Provider, MD  carvedilol (COREG) 3.125 MG tablet Take 1 tablet by mouth 2  times daily with  meals 07/15/16   Lorretta Harp, MD  Cholecalciferol (VITAMIN D) 2000 UNITS CAPS Take 2,000 Units by mouth every morning.     Historical Provider, MD  dorzolamide (TRUSOPT) 2 % ophthalmic solution Place 1 drop into the right eye 2 (two) times  daily.    Historical Provider, MD  esomeprazole (NEXIUM) 40 MG capsule Take 40 mg by mouth daily before breakfast.    Historical Provider, MD  feeding supplement, GLUCERNA SHAKE, (GLUCERNA SHAKE) LIQD Take 237 mLs by mouth daily at 3 pm.     Historical Provider, MD  ferrous sulfate 325 (65 FE) MG tablet Take 1 tablet (325 mg total) by mouth 2 (two) times daily with a meal. 03/21/14   Bobby Rumpf [place], PA-C  fluticasone (FLONASE) 50 MCG/ACT nasal spray Place 1 spray into both nostrils at bedtime as needed for allergies.     Historical Provider, MD  furosemide (LASIX) 40 MG tablet Take 1 tablet (40 mg total) by mouth daily. 07/07/14   Lorretta Harp, MD  glimepiride (AMARYL) 2 MG tablet Take 2 mg by mouth every morning.  10/21/14   Historical Provider, MD  ipratropium-albuterol (DUONEB) 0.5-2.5 (3) MG/3ML SOLN Take 3 mLs by nebulization every 4 (four) hours as needed (shortness of breath).    Historical Provider, MD  Liniments ([employer] ARTHRITIS PAIN RELIEVER EX) Apply 1 application topically 2 (two) times daily as needed (for shoulder pains, knee, back.).     Historical Provider, MD  metFORMIN (GLUCOPHAGE) 500 MG tablet Take 500 mg by mouth daily after supper. Pt. Told that she is  only to take only as needed , for a blood sugar of 250 or greater    Historical Provider, MD  Multiple Vitamins-Minerals (ICAPS) CAPS Take 1 capsule by mouth every morning.     Historical Provider, MD  ONE TOUCH ULTRA TEST test strip 1 each by Other route See admin instructions. Check blood sugar twice daily 06/03/15   Historical Provider, MD  pravastatin (PRAVACHOL) 10 MG tablet Take 10 mg by mouth 2 (two) times a week. Monday  and Thursday    Historical Provider, MD  predniSONE (DELTASONE) 5 MG tablet Take 5 mg by mouth every Monday, Wednesday, and Friday.     Historical Provider, MD  ranitidine (ZANTAC) 150 MG capsule Take 150 mg by mouth at bedtime.  02/20/14   Historical Provider, MD  simethicone (MYLICON) 80 MG chewable tablet  Chew 1 tablet (80 mg total) by mouth 4 (four) times daily as needed for flatulence. 12/04/13   Robbie Lis, MD  Travoprost, BAK Free, (TRAVATAMN) 0.004 % SOLN ophthalmic solution Place 1 drop into both eyes at bedtime.     Historical Provider, MD   Ct Head Wo Contrast  Result Date: 09/30/2016 CLINICAL DATA:  Right-sided weakness EXAM: CT HEAD WITHOUT CONTRAST TECHNIQUE: Contiguous axial images were obtained from the base of the skull through the vertex without intravenous contrast. COMPARISON:  MRI 07/24/2009 FINDINGS: Brain: Acute infarct in the left MCA territory. Low-density is seen in the insula extending into the left frontal temporal lobe and left parietal lobe. There is hemorrhagic transformation with blood in the left frontal temporal lobe and in the left parietal lobe. Generalized atrophy. Negative for hydrocephalus. Low-density in the parietal white matter bilaterally appears chronic. Brainstem and cerebellum intact. Vascular: Hyperdense vessel in the sylvian fissure compatible with thrombosed left MCA branch leading to acute infarct. M1 does not appear hyperdense. Skull: Negative Sinuses/Orbits: Mucosal edema in the paranasal sinuses. NG tube in place. Normal orbit. 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 No shift of the midline structures. Atrophy and chronic ischemic change in the white matter bilaterally These results were called by telephone at the time of interpretation on 09/30/2016 at 11:51 am to Dr. Rockne Menghini , who verbally acknowledged these results. Electronically Signed   By: Franchot Gallo M.D.   On: 09/30/2016 11:52   Dg Chest Portable 1 View  Result Date: 09/30/2016 CLINICAL DATA:  Hypoxia with endotracheal tube adjustment EXAM: PORTABLE CHEST 1 VIEW COMPARISON:  Study obtained earlier in the day FINDINGS: Endotracheal tube tip is now 3.6 cm above the carina. No pneumothorax. There is airspace consolidation in the left base laterally. Lungs  elsewhere are clear. Heart is mildly enlarged with pulmonary vascularity within normal limits. No adenopathy. There is atherosclerotic calcification in the aorta. No evident bone lesions. IMPRESSION: Endotracheal tube as described without pneumothorax. Left base airspace consolidation consistent with pneumonia. Lungs elsewhere clear. Stable cardiac silhouette. There is aortic atherosclerosis. Electronically Signed   By: Lowella Grip III M.D.   On: 09/30/2016 09:36   Dg Chest Portable 1 View  Result Date: 09/30/2016 CLINICAL DATA:  75 year old female found unresponsive this morning. Intubated. Initial encounter. EXAM: PORTABLE CHEST 1 VIEW COMPARISON:  09/16/2015 and earlier. FINDINGS: Portable AP semi upright view at 0915 hours. Endotracheal tube tip in good position about 2.5 cm above the carina. Enteric tube placed, tip not definitely included. Somewhat low lung volumes. Mild patchy opacity at the left lung base. Stable cardiomegaly and mediastinal contours. Calcified aortic atherosclerosis. No pneumothorax, pulmonary edema, or definite pleural effusion. IMPRESSION: 1. ET tube in good position. Enteric tube courses to the left abdomen. 2. Lower lung volumes. Patchy left lung base opacity could reflect atelectasis, but consider also aspiration or bronchopneumonia in this setting. 3.  Calcified aortic atherosclerosis. Electronically Signed   By: Genevie Ann M.D.   On: 09/30/2016 09:34   Ct Angio Chest/abd/pel For Dissection W And/or Wo Contrast  Result Date: 09/30/2016 CLINICAL DATA:  Respiratory distress, right-sided weakness EXAM: CT ANGIOGRAPHY CHEST, ABDOMEN AND PELVIS TECHNIQUE: Multidetector CT imaging through the chest, abdomen and pelvis was performed using the standard protocol during bolus administration of intravenous contrast. Multiplanar reconstructed images and MIPs were obtained and reviewed to evaluate the vascular anatomy. CONTRAST:  100 mL Isovue 370 COMPARISON:  None. FINDINGS: CTA CHEST  FINDINGS Cardiovascular: Preferential opacification of the thoracic aorta. No evidence of thoracic aortic aneurysm or dissection. Thoracic aortic atherosclerosis major vessels arising from the aortic arch are patent. Bilateral vertebral arteries are patent. Dense coronary artery atherosclerosis in the LAD, left main, and circumflex. Normal heart size. No pericardial effusion. Mediastinum/Nodes: No enlarged mediastinal, hilar, or axillary lymph nodes. Thyroid gland, trachea, and esophagus demonstrate no significant findings. Lungs/Pleura: Bibasilar atelectasis. Lingular airspace disease which may reflect atelectasis versus pneumonia. No pleural effusion or pneumothorax. Musculoskeletal: No acute osseous abnormality. Degenerative disc disease throughout the thoracic spine. No aggressive lytic or sclerotic osseous lesion. Degenerative disc disease with disc space narrowing of the lower cervical spine. Review of the MIP images confirms the above findings. CTA ABDOMEN AND PELVIS FINDINGS VASCULAR Aorta: Normal caliber aorta without aneurysm, dissection, vasculitis or significant stenosis. Abdominal aortic atherosclerosis. Celiac: Patent without evidence of aneurysm, dissection, vasculitis or significant stenosis. Mild atherosclerotic plaque at the origin. SMA: Patent without evidence of aneurysm, dissection, vasculitis or significant stenosis. Mild atherosclerosis at the origin and slightly more distally. Renals: Both renal arteries are patent without evidence of aneurysm, dissection, vasculitis, fibromuscular dysplasia or significant stenosis. IMA: Patent without evidence of aneurysm, dissection, vasculitis or significant stenosis. Veins: No obvious venous abnormality within the limitations of this arterial phase study. Iliacs: Atherosclerotic disease involving bilateral common iliac arteries extending to the external iliac arteries. 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 Review of the MIP images confirms the above findings. NON-VASCULAR Hepatobiliary: 10 mm hypodensity in the right hepatic lobe of indeterminate etiology likely representing a small cyst unchanged from 05/20/2014. Liver is otherwise unremarkable. No gallstones, gallbladder wall thickening, or biliary dilatation. Pancreas: Unremarkable. No pancreatic ductal dilatation or surrounding inflammatory changes. Spleen: Normal in size without focal abnormality. Adrenals/Urinary Tract: Adrenal glands are unremarkable. Kidneys are normal, without renal calculi, focal lesion, or hydronephrosis. Bladder is decompressed with a Foley catheter present. Stomach/Bowel: Stomach is within normal limits. Sigmoid diverticulosis without evidence of diverticulitis. Appendix appears normal. No evidence of bowel wall thickening, distention, or inflammatory changes. Lymphatic:  No lymphandeopathy. Reproductive: Status post hysterectomy. No adnexal masses. Other: Fat containing umbilical hernia.  No abdominopelvic ascites. Musculoskeletal: Nondisplaced fracture of the right superior and inferior pubic rami. Incidentally noted, severe radiocarpal and first [facility] joint osteoarthritis bilaterally. Review of the MIP images confirms the above findings. IMPRESSION: 1. No aortic aneurysm or dissection. 2. Diverticulosis without evidence diverticulitis. 3. Nondisplaced fracture of the right superior and inferior pubic rami. 4. 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 5.  Aortic Atherosclerosis (ICD10-170.0) 6. Severe coronary artery atherosclerosis. 7. Lingular airspace disease which may  reflect atelectasis versus pneumonia. Electronically Signed   By: Kathreen Devoid   On: 09/30/2016 12:06    Positive ROS: All other systems have been reviewed and were otherwise negative with the exception of those mentioned in the HPI and as above.  Physical Exam: General: Alert, no acute distress Cardiovascular: No pedal edema Respiratory: No cyanosis, no use of accessory musculature GI: No organomegaly, abdomen is soft and non-tender Skin: No lesions in  the area of chief complaint Neurologic: Sensation intact distally Psychiatric: Patient is competent for consent with normal mood and affect Lymphatic: No axillary or cervical lymphadenopathy  MUSCULOSKELETAL:   Examination of the right lower extremity reveals no skin wounds or lesions no crepitation she has full range of motion of the hip and knee. No swelling no evidence of trauma. Motor and sensory exam deferred due to mental status.  Examination of the left lower extremity reveals a well-healed TMA on the left side. There is no swelling, open wounds, or crepitation. Motor and sensory exam deferred due to mental status.  Assessment: Right LC 1 pelvis fracture, amenable to conservative management.  Plan: I discussed the findings with the patient's family. She may weight-bear as tolerated with a walker. I would like to see her in the office in 4 weeks for repeat radiographs. All questions solicited and answered thanks.    Raye Slyter, Horald Pollen, MD Cell [phone]    09/30/2016 2:24 PM

## 2016-09-30 NOTE — ED Notes (Signed)
Spoke to CCM regarding pt's temp of 101.4, tylenol suppository ordered.

## 2016-09-30 NOTE — H&P (Signed)
PULMONARY / CRITICAL CARE MEDICINE   Name: [name] MRN: [id number] DOB: Feb 04, 1941    ADMISSION DATE:  09/30/2016  REFERRING MD:  Dr. Cletus Gash, ER  CHIEF COMPLAINT:  Rt sided weakness.  HISTORY OF PRESENT ILLNESS:   Hx from chart and pt's niece (Dr. Lauro Franklin).  75 yo female was noted to be making gurgling noises by family and was not moving right side.  Last seen at baseline prior to going to bed on 11/02.  She had blood pressure of 224/113.  She was given labetalol.  There was concern for airway protection and she required intubation.  She had elevated lactic acid and given Abx.  She was also given solumedrol and neb tx.  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  PAST MEDICAL HISTORY :  She  has a past medical history of Anemia of chronic illness (11/06/2015); Ankylosing spondylitis ([place]); Anxiety; Arthritis; Asthma; Barrett esophagus; Blood transfusion (~ 1962); Candida esophagitis ([place]); Carotid artery disease ([place]); Carpal tunnel syndrome on right; Chronic back pain; Chronic bronchitis ([place]); Chronic sinus infection; Cirrhosis of liver without mention of alcohol; COPD (chronic obstructive pulmonary disease) (H[facility]); COPD (chronic obstructive pulmonary disease) ([place]); Coronary artery disease; Critical lower limb ischemia, with ulcer Lt foot (12/07/2012); Dermatophytosis of nail ([id number]); Diverticulosis; DJD (degenerative joint disease) of knee; Dysrhythmia; Esophageal dilatation; Exertional dyspnea; External bleeding hemorrhoids; GERD (gastroesophageal reflux disease); GI bleed; Gout; Heart murmur; Hiatal hernia; History of nuclear stress test (11/2012); Hypercholesteremia; Hypertension;  Iron deficiency anemia; Myocardial infarction; Non-healing ulcer of foot, secondary to diabetes and PVD (12/07/2012); OA (osteoarthritis); PAD (peripheral artery disease) ([place]); Pericardial effusion; Peripheral vascular disease ([place]); Pneumonia (10/07/11; 2006; 1980's); Psoriasis; Psoriasis; Psoriatic arthritis ([place]); PVD (peripheral vascular disease) ([place]) (12/07/2012); Sinus headache; Tobacco abuse (12/07/2012); and Type II diabetes mellitus ([place]).  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  Allergies  Allergen Reactions  . Amlodipine Besy-Benazepril Hcl Other (See Comments)    Nervous/shakiness  . Statins Other (See Comments)    "can't take any of them; cramp me up" (12/06/2012)  . Tradjenta [Linagliptin] Other (See Comments)    Extreme muscle pains, chest and back pains  . Plavix [Clopidogrel] Rash and Other (See Comments)    Severe burning of the skin.Pt states that the doctor told her that her rash was psoriasis and not related to plavix but she states the rash came from the plavix  . Raptiva [Efalizumab] Rash    No current facility-administered medications on file prior to encounter.    Current Outpatient Prescriptions on File Prior to  Encounter  Medication Sig  . ADVAIR HFA 115-21 MCG/ACT inhaler Inhale 2 puffs into the lungs daily.  Marland Kitchen albuterol (PROVENTIL HFA;VENTOLIN HFA) 108 (90 BASE) MCG/ACT inhaler Inhale 1 puff into the lungs every 6 (six)  hours as needed for shortness of breath.   . ALPRAZolam (XANAX) 0.5 MG tablet Take 0.25 mg by mouth 2 (two) times daily as needed for anxiety or sleep.  Marland Kitchen ammonium lactate (LAC-HYDRIN) 12 % lotion Apply 1 application topically daily as needed for dry skin (psorias).   . Artificial Tear Ointment (LUBRICANT EYE OP) Place 1 drop into both eyes as needed (for dry eyes).  Marland Kitchen aspirin 81 MG tablet Take 81 mg by mouth daily.  . carvedilol (COREG) 3.125 MG tablet Take 1 tablet by mouth 2  times daily with  meals  . Cholecalciferol (VITAMIN D) 2000 UNITS CAPS Take 2,000 Units by mouth every morning.   . dorzolamide (TRUSOPT) 2 % ophthalmic solution Place 1 drop into the right eye 2 (two) times daily.  Marland Kitchen esomeprazole (NEXIUM) 40 MG capsule Take 40 mg by mouth daily before breakfast.  . feeding supplement, GLUCERNA SHAKE, (GLUCERNA SHAKE) LIQD Take 237 mLs by mouth daily at 3 pm.   . ferrous sulfate 325 (65 FE) MG tablet Take 1 tablet (325 mg total) by mouth 2 (two) times daily with a meal.  . fluticasone (FLONASE) 50 MCG/ACT nasal spray Place 1 spray into both nostrils at bedtime as needed for allergies.   . furosemide (LASIX) 40 MG tablet Take 1 tablet (40 mg total) by mouth daily.  Marland Kitchen glimepiride (AMARYL) 2 MG tablet Take 2 mg by mouth every morning.   Marland Kitchen ipratropium-albuterol (DUONEB) 0.5-2.5 (3) MG/3ML SOLN Take 3 mLs by nebulization every 4 (four) hours as needed (shortness of breath).  . Liniments ([employer] ARTHRITIS PAIN RELIEVER EX) Apply 1 application topically 2 (two) times daily as needed (for shoulder pains, knee, back.).   Marland Kitchen metFORMIN (GLUCOPHAGE) 500 MG tablet Take 500 mg by mouth daily after supper. Pt. Told that she is  only to take only as needed , for a blood sugar of 250 or greater  .  Multiple Vitamins-Minerals (ICAPS) CAPS Take 1 capsule by mouth every morning.   . ONE TOUCH ULTRA TEST test strip 1 each by Other route See admin instructions. Check blood sugar twice daily  . pravastatin (PRAVACHOL) 10 MG tablet Take 10 mg by mouth 2 (two) times a week. Monday  and Thursday  . predniSONE (DELTASONE) 5 MG tablet Take 5 mg by mouth every Monday, Wednesday, and Friday.   . ranitidine (ZANTAC) 150 MG capsule Take 150 mg by mouth at bedtime.   . simethicone (MYLICON) 80 MG chewable tablet Chew 1 tablet (80 mg total) by mouth 4 (four) times daily as needed for flatulence.  . Travoprost, BAK Free, (TRAVATAMN) 0.004 % SOLN ophthalmic solution Place 1 drop into both eyes at bedtime.     FAMILY HISTORY:  Her indicated that her mother is deceased. She indicated that her father is deceased. She indicated that the status of her maternal grandmother is unknown. She indicated that the status of her paternal grandfather is unknown. She indicated that the status of her neg hx is unknown.    SOCIAL HISTORY: She  reports that she has quit smoking. Her smoking use included Cigarettes. She has a 40.00 pack-year smoking history. She quit smokeless tobacco use about 3 years ago. She reports that she does not drink alcohol or use drugs.  REVIEW OF SYSTEMS:   Unable to obtain.  SUBJECTIVE:   VITAL SIGNS: BP 180/68   Pulse 63   Temp 100.4 F (38 C)   Resp 26   Ht 5\' 7"  (1.702 m)  Wt 178 lb (80.7 kg)   SpO2 100%   BMI 27.88 kg/m   HEMODYNAMICS:    VENTILATOR SETTINGS: Vent Mode: PRVC FiO2 (%):  [40 %-100 %] 40 % Set Rate:  [16 bmp] 16 bmp Vt Set:  [500 mL] 500 mL PEEP:  [5 cmH20] 5 cmH20 Plateau Pressure:  [21 cmH20] 21 cmH20  INTAKE / OUTPUT: No intake/output data recorded.  PHYSICAL EXAMINATION: General:  Sedated Neuro:  RASS -3 HEENT:  Pupils mid point and reactive Cardiovascular:  Regular, no murmur Lungs:  No wheeze/rales Abdomen:  Soft, decreased BS, non  tender Musculoskeletal:  Lt foot TMA, no edema Skin:  Chronic venous stasis changes  LABS:  BMET  Recent Labs Lab 09/30/16 0940 09/30/16 0953  NA 138 138  K 4.4 4.4  CL 103 102  CO2 25  --   BUN 27* 30*  CREATININE 1.45* 1.40*  GLUCOSE 270* 266*    Electrolytes  Recent Labs Lab 09/30/16 0940  CALCIUM 9.6    CBC  Recent Labs Lab 09/30/16 0940 09/30/16 0953  WBC 14.0*  --   HGB 11.5* 12.2  HCT 35.4* 36.0  PLT 174  --     Coag's  Recent Labs Lab 09/30/16 0940  APTT 24  INR 1.04    Sepsis Markers  Recent Labs Lab 09/30/16 0940 09/30/16 0952 09/30/16 0954  LATICACIDVEN  --  2.2* 2.05*  PROCALCITON <0.10  --   --     ABG  Recent Labs Lab 09/30/16 1056  PHART 7.320*  PCO2ART 54.5*  PO2ART 427.0*    Liver Enzymes  Recent Labs Lab 09/30/16 0940  AST 30  ALT 21  ALKPHOS 66  BILITOT 0.7  ALBUMIN 3.3*    Cardiac Enzymes No results for input(s): TROPONINI, PROBNP in the last 168 hours.  Glucose No results for input(s): GLUCAP in the last 168 hours.  Imaging Ct Head Wo Contrast  Result Date: 09/30/2016 CLINICAL DATA:  Right-sided weakness EXAM: CT HEAD WITHOUT CONTRAST TECHNIQUE: Contiguous axial images were obtained from the base of the skull through the vertex without intravenous contrast. COMPARISON:  MRI 07/24/2009 FINDINGS: Brain: Acute infarct in the left MCA territory. Low-density is seen in the insula extending into the left frontal temporal lobe and left parietal lobe. There is hemorrhagic transformation with blood in the left frontal temporal lobe and in the left parietal lobe. Generalized atrophy. Negative for hydrocephalus. Low-density in the parietal white matter bilaterally appears chronic. Brainstem and cerebellum intact. Vascular: Hyperdense vessel in the sylvian fissure compatible with thrombosed left MCA branch leading to acute infarct. M1 does not appear hyperdense. Skull: Negative Sinuses/Orbits: Mucosal edema in the  paranasal sinuses. NG tube in place. Normal orbit. 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 No shift of the midline structures. Atrophy and chronic ischemic change in the white matter bilaterally These results were called by telephone at the time of interpretation on 09/30/2016 at 11:51 am to Dr. Rockne Menghini , who verbally acknowledged these results. Electronically Signed   By: Franchot Gallo M.D.   On: 09/30/2016 11:52   Dg Chest Portable 1 View  Result Date: 09/30/2016 CLINICAL DATA:  Hypoxia with endotracheal tube adjustment EXAM: PORTABLE CHEST 1 VIEW COMPARISON:  Study obtained earlier in the day FINDINGS: Endotracheal tube tip is now 3.6 cm above the carina. No pneumothorax. There is airspace consolidation in the left base laterally. Lungs elsewhere are clear. Heart is mildly enlarged with pulmonary vascularity within normal limits. No adenopathy.  There is atherosclerotic calcification in the aorta. No evident bone lesions. IMPRESSION: Endotracheal tube as described without pneumothorax. Left base airspace consolidation consistent with pneumonia. Lungs elsewhere clear. Stable cardiac silhouette. There is aortic atherosclerosis. Electronically Signed   By: Lowella Grip III M.D.   On: 09/30/2016 09:36   Dg Chest Portable 1 View  Result Date: 09/30/2016 CLINICAL DATA:  75 year old female found unresponsive this morning. Intubated. Initial encounter. EXAM: PORTABLE CHEST 1 VIEW COMPARISON:  09/16/2015 and earlier. FINDINGS: Portable AP semi upright view at 0915 hours. Endotracheal tube tip in good position about 2.5 cm above the carina. Enteric tube placed, tip not definitely included. Somewhat low lung volumes. Mild patchy opacity at the left lung base. Stable cardiomegaly and mediastinal contours. Calcified aortic atherosclerosis. No pneumothorax, pulmonary edema, or definite pleural effusion. IMPRESSION: 1. ET tube in good position. Enteric tube courses to the  left abdomen. 2. Lower lung volumes. Patchy left lung base opacity could reflect atelectasis, but consider also aspiration or bronchopneumonia in this setting. 3.  Calcified aortic atherosclerosis. Electronically Signed   By: Genevie Ann M.D.   On: 09/30/2016 09:34   Ct Angio Chest/abd/pel For Dissection W And/or Wo Contrast  Result Date: 09/30/2016 CLINICAL DATA:  Respiratory distress, right-sided weakness EXAM: CT ANGIOGRAPHY CHEST, ABDOMEN AND PELVIS TECHNIQUE: Multidetector CT imaging through the chest, abdomen and pelvis was performed using the standard protocol during bolus administration of intravenous contrast. Multiplanar reconstructed images and MIPs were obtained and reviewed to evaluate the vascular anatomy. CONTRAST:  100 mL Isovue 370 COMPARISON:  None. FINDINGS: CTA CHEST FINDINGS Cardiovascular: Preferential opacification of the thoracic aorta. No evidence of thoracic aortic aneurysm or dissection. Thoracic aortic atherosclerosis major vessels arising from the aortic arch are patent. Bilateral vertebral arteries are patent. Dense coronary artery atherosclerosis in the LAD, left main, and circumflex. Normal heart size. No pericardial effusion. Mediastinum/Nodes: No enlarged mediastinal, hilar, or axillary lymph nodes. Thyroid gland, trachea, and esophagus demonstrate no significant findings. Lungs/Pleura: Bibasilar atelectasis. Lingular airspace disease which may reflect atelectasis versus pneumonia. No pleural effusion or pneumothorax. Musculoskeletal: No acute osseous abnormality. Degenerative disc disease throughout the thoracic spine. No aggressive lytic or sclerotic osseous lesion. Degenerative disc disease with disc space narrowing of the lower cervical spine. Review of the MIP images confirms the above findings. CTA ABDOMEN AND PELVIS FINDINGS VASCULAR Aorta: Normal caliber aorta without aneurysm, dissection, vasculitis or significant stenosis. Abdominal aortic atherosclerosis. Celiac: Patent  without evidence of aneurysm, dissection, vasculitis or significant stenosis. Mild atherosclerotic plaque at the origin. SMA: Patent without evidence of aneurysm, dissection, vasculitis or significant stenosis. Mild atherosclerosis at the origin and slightly more distally. Renals: Both renal arteries are patent without evidence of aneurysm, dissection, vasculitis, fibromuscular dysplasia or significant stenosis. IMA: Patent without evidence of aneurysm, dissection, vasculitis or significant stenosis. Veins: No obvious venous abnormality within the limitations of this arterial phase study. Iliacs: Atherosclerotic disease involving bilateral common iliac arteries extending to the external iliac arteries. 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 Review of the MIP images confirms the above findings. NON-VASCULAR Hepatobiliary: 10 mm hypodensity in the right hepatic lobe of indeterminate etiology likely representing a small cyst unchanged from 05/20/2014. Liver is otherwise unremarkable. No gallstones, gallbladder wall thickening, or biliary dilatation. Pancreas: Unremarkable. No pancreatic ductal dilatation or surrounding inflammatory changes. Spleen:  Normal in size without focal abnormality. Adrenals/Urinary Tract: Adrenal glands are unremarkable. Kidneys are normal, without renal calculi, focal lesion, or hydronephrosis. Bladder is decompressed with a Foley catheter present. Stomach/Bowel: Stomach is within normal limits. Sigmoid diverticulosis without evidence of diverticulitis. Appendix appears normal. No evidence of bowel wall thickening, distention, or inflammatory changes. Lymphatic:  No lymphandeopathy. Reproductive: Status post hysterectomy. No adnexal masses. Other: Fat containing umbilical hernia.  No  abdominopelvic ascites. Musculoskeletal: Nondisplaced fracture of the right superior and inferior pubic rami. Incidentally noted, severe radiocarpal and first [facility] joint osteoarthritis bilaterally. Review of the MIP images confirms the above findings. IMPRESSION: 1. No aortic aneurysm or dissection. 2. Diverticulosis without evidence diverticulitis. 3. Nondisplaced fracture of the right superior and inferior pubic rami. 4. 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 5.  Aortic Atherosclerosis (ICD10-170.0) 6. Severe coronary artery atherosclerosis. 7. Lingular airspace disease which may reflect atelectasis versus pneumonia. 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  Also found to have non displaced Rt pubic rami fracture.  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 - neurology consulted - RASS goal 0 to -1   HTN emergency. - goal SBP < 160  Compromised airway in setting of CVA. Hx of COPD/asthma. - full vent support - f/u CXR -  scheduled pulmicort, duoneb  Pubic rami fracture. - consult orthopedics  DM type II. - SSI  Lactic acidosis >> no evidence for infection. - f/u lactic acid  Nutrition. Hx of GERD. - tube feeds if unable to extubate soon  CKD 2. - monitor renal fx, urine outpt  DVT prophylaxis - SCDs SUP - Protonix Goals of care - Full code  Updated pt's niece at bedside.  Chesley Mires, MD [facility] Pulmonary/Critical Care 09/30/2016, 12:41 PM Pager:  [phone] After 3pm call: [phone]

## 2016-09-30 NOTE — Consult Note (Signed)
NEURO HOSPITALIST CONSULT NOTE   Requestig physician: Dr. Halford Chessman   Reason for Consult: L MCA infract   History obtained from:  Chart  HPI:                                                                                                                                          [name] is an 75 yo female was noted to be making gurgling noises by family and was not moving right side.  Last seen at baseline prior to going to bed on 11/02  Past Medical History:  Diagnosis Date  . Anemia of chronic illness 11/06/2015  . Ankylosing spondylitis ([place])   . Anxiety   . Arthritis    "psorasic"  . Asthma   . Barrett esophagus   . Blood transfusion ~ 1962   "11 units of blood"  . Candida esophagitis ([place])   . Carotid artery disease (H[facility])    dopplers showed mod L ICA stenosis   . Carpal tunnel syndrome on right   . Chronic back pain   . Chronic bronchitis ([place])    "since childhood" (12/06/2012)  . Chronic sinus infection   . Cirrhosis of liver without mention of alcohol   . COPD (chronic obstructive pulmonary disease) ([place])   . COPD (chronic obstructive pulmonary disease) ([place])   . Coronary artery disease   . Critical lower limb ischemia, with ulcer Lt foot 12/07/2012  . Dermatophytosis of nail [id number]  . Diverticulosis   . DJD (degenerative joint disease) of knee    "both" (12/06/2012)  . Dysrhythmia    "palpitations"  . Esophageal dilatation   . Exertional dyspnea   . External bleeding hemorrhoids   . GERD (gastroesophageal reflux disease)   . GI bleed   . Gout   . Heart murmur   . Hiatal hernia   . History of nuclear stress test 11/2012   lexiscan; normal, low risk   . Hypercholesteremia   . Hypertension   . Iron deficiency anemia   . Myocardial infarction   . Non-healing ulcer of foot, secondary to diabetes and PVD 12/07/2012  . OA (osteoarthritis)   . PAD (peripheral artery disease) ([place])   . Pericardial effusion    moderate by 2-D echo without  evidence of An odd  . Peripheral vascular disease ([place])   . Pneumonia 10/07/11; 2006; 1980's  . Psoriasis   . Psoriasis   . Psoriatic arthritis ([place])   . PVD (peripheral vascular disease) ([place]) 12/07/2012  . Sinus headache   . Tobacco abuse 12/07/2012  . Type II diabetes mellitus ([place])     Past Surgical History:  Procedure Laterality Date  . ABDOMINAL HYSTERECTOMY  1976  . ANGIOPLASTY  12/10/2012   "LLE"; LSFA [place] orbital rotational arthrectomy, PTA ,  stenting with IDEV stent (left ABI improved from 0.35 to 0.75)  . APPENDECTOMY    . ATHERECTOMY N/A 12/10/2012   Procedure: ATHERECTOMY;  Surgeon: Lorretta Harp, MD;  Location: [facility] CATH LAB;  Service: Cardiovascular;  Laterality: N/A;  . BREAST LUMPECTOMY Left 10/06/2015   Procedure: LEFT BREAST LUMPECTOMY;  Surgeon: Donnie Mesa, MD;  Location: [place];  Service: General;  Laterality: Left;  . CARDIAC CATHETERIZATION N/A 09/21/2015   Procedure: Left Heart Cath and Coronary Angiography;  Surgeon: Lorretta Harp, MD;  Location: [facility] CV LAB;  Service: Cardiovascular;  Laterality: N/A;  . CARPAL TUNNEL RELEASE  2011   left hand  . CATARACT EXTRACTION W/ INTRAOCULAR LENS IMPLANT  2009/2010   right/left  . COLONOSCOPY  2007   Hemorrhoids and Diverticulosis  . CORONARY ANGIOPLASTY     x2  . ENTEROSCOPY N/A 04/14/2015   Procedure: ENTEROSCOPY;  Surgeon: Irene Shipper, MD;  Location: [facility] ENDOSCOPY;  Service: Endoscopy;  Laterality: N/A;  . ESOPHAGOGASTRODUODENOSCOPY  2011  . ESOPHAGOGASTRODUODENOSCOPY N/A 03/17/2014   Procedure: ESOPHAGOGASTRODUODENOSCOPY (EGD);  Surgeon: Irene Shipper, MD;  Location: [facility] ENDOSCOPY;  Service: Endoscopy;  Laterality: N/A;  . EYE SURGERY    . LEFT HEART CATHETERIZATION WITH CORONARY ANGIOGRAM N/A 04/09/2014   Procedure: LEFT HEART CATHETERIZATION WITH CORONARY ANGIOGRAM;  Surgeon: Sinclair Grooms, MD;  Location: [facility] CATH LAB;  Service: Cardiovascular;  Laterality: N/A;  . LIPOMA EXCISION  2010    "left occipital area"  . LOWER EXTREMITY ANGIOGRAM N/A 12/06/2012   Procedure: LOWER EXTREMITY ANGIOGRAM;  Surgeon: Lorretta Harp, MD;  Location: [facility] CATH LAB;  Service: Cardiovascular;  Laterality: N/A;  . NASAL SINUS SURGERY  09/29/2009  . OOPHORECTOMY     'not sure which one"  . PERCUTANEOUS CORONARY ROTOBLATOR INTERVENTION (PCI-R) N/A 04/16/2014   Procedure: PERCUTANEOUS CORONARY ROTOBLATOR INTERVENTION (PCI-R);  Surgeon: Blane Ohara, MD;  Location: [facility] CATH LAB;  Service: Cardiovascular;  Laterality: N/A;  . TRANSMETATARSAL AMPUTATION Left 01/08/2013   Procedure: TRANSMETATARSAL AMPUTATION;  Surgeon: Angelia Mould, MD;  Location: [facility] OR;  Service: Vascular;  Laterality: Left;    Family History  Problem Relation Age of Onset  . Cancer Mother   . Lupus Sister     x2  . Lung cancer Sister   . Thyroid disease Sister   . Heart Problems Maternal Grandmother   . Cancer Paternal Grandfather   . Diabetes Brother     x2  . Arthritis Brother     x3  . Arthritis Sister     x3  . Colon cancer Neg Hx       Social History:  reports that she has quit smoking. Her smoking use included Cigarettes. She has a 40.00 pack-year smoking history. She quit smokeless tobacco use about 3 years ago. She reports that she does not drink alcohol or use drugs.  Allergies  Allergen Reactions  . Amlodipine Besy-Benazepril Hcl Other (See Comments)    Nervous/shakiness  . Statins Other (See Comments)    "can't take any of them; cramp me up" (12/06/2012)  . Tradjenta [Linagliptin] Other (See Comments)    Extreme muscle pains, chest and back pains  . Plavix [Clopidogrel] Rash and Other (See Comments)    Severe burning of the skin.Pt states that the doctor told her that her rash was psoriasis and not related to plavix but she states the rash came from the plavix  . Raptiva [Efalizumab] Rash    MEDICATIONS:  I have reviewed the patient's current medications.   ROS:                                                                                                                                       History obtained from chart review  General ROS: negative for - chills, fatigue, fever, night sweats, weight gain or weight loss Psychological ROS: negative for - behavioral disorder, hallucinations, memory difficulties, mood swings or suicidal ideation Ophthalmic ROS: negative for - blurry vision, double vision, eye pain or loss of vision ENT ROS: negative for - epistaxis, nasal discharge, oral lesions, sore throat, tinnitus or vertigo Allergy and Immunology ROS: negative for - hives or itchy/watery eyes Hematological and Lymphatic ROS: negative for - bleeding problems, bruising or swollen lymph nodes Endocrine ROS: negative for - galactorrhea, hair pattern changes, polydipsia/polyuria or temperature intolerance Respiratory ROS: negative for - cough, hemoptysis, shortness of breath or wheezing Cardiovascular ROS: negative for - chest pain, dyspnea on exertion, edema or irregular heartbeat Gastrointestinal ROS: negative for - abdominal pain, diarrhea, hematemesis, nausea/vomiting or stool incontinence Genito-Urinary ROS: negative for - dysuria, hematuria, incontinence or urinary frequency/urgency Musculoskeletal ROS: negative for - joint swelling or muscular weakness Neurological ROS: as noted in HPI Dermatological ROS: negative for rash and skin lesion changes   Blood pressure 180/68, pulse 63, temperature 100.4 F (38 C), resp. rate 26, height 5\' 7"  (1.702 m), weight 80.7 kg (178 lb), SpO2 99 %.   Neurologic Examination:                                                                                                      HEENT-  Normocephalic, no lesions, without obvious abnormality.  Normal external eye and conjunctiva.  Normal TM's bilaterally.  Normal auditory canals and  external ears. Normal external nose, mucus membranes and septum.  Normal pharynx. Cardiovascular- regular rate and rhythm, S1, S2 normal, no murmur, click, rub or gallop, pulses palpable throughout   Lungs- chest clear, no wheezing, rales, normal symmetric air entry, Heart exam - S1, S2 normal, no murmur, no gallop, rate regular Abdomen- soft, non-tender; bowel sounds normal; no masses,  no organomegaly   Neurological Examination Mental Status: On propofol Opened eyes to noxious stimuli Withdraws in b/l LE's Withdraws in the L UE No movement in the R UE Pupils are small and sluggish b/l + corneals + cough + gag + VOR's      Lab Results: Basic Metabolic Panel:  Recent Labs Lab  09/30/16 0940 09/30/16 0953  NA 138 138  K 4.4 4.4  CL 103 102  CO2 25  --   GLUCOSE 270* 266*  BUN 27* 30*  CREATININE 1.45* 1.40*  CALCIUM 9.6  --     Liver Function Tests:  Recent Labs Lab 09/30/16 0940  AST 30  ALT 21  ALKPHOS 66  BILITOT 0.7  PROT 6.5  ALBUMIN 3.3*    Recent Labs Lab 09/30/16 0940  LIPASE 42   No results for input(s): AMMONIA in the last 168 hours.  CBC:  Recent Labs Lab 09/30/16 0940 09/30/16 0953  WBC 14.0*  --   NEUTROABS 12.3*  --   HGB 11.5* 12.2  HCT 35.4* 36.0  MCV 93.2  --   PLT 174  --     Cardiac Enzymes: No results for input(s): CKTOTAL, CKMB, CKMBINDEX, TROPONINI in the last 168 hours.  Lipid Panel:  Recent Labs Lab 09/30/16 0953  TRIG 159*    CBG: No results for input(s): GLUCAP in the last 168 hours.  Microbiology: Results for orders placed or performed during the hospital encounter of 04/05/14  Culture, blood (routine x 2)     Status: None   Collection Time: 04/05/14 10:50 AM  Result Value Ref Range Status   Specimen Description BLOOD RIGHT HAND  Final   Special Requests BOTTLES DRAWN AEROBIC AND ANAEROBIC 5CC  Final   Culture  Setup Time   Final    04/05/2014 17:38 Performed at [facility]   Culture    Final    NO GROWTH 5 DAYS Performed at [facility]   Report Status 04/11/2014 FINAL  Final  Culture, blood (routine x 2)     Status: None   Collection Time: 04/05/14 11:35 AM  Result Value Ref Range Status   Specimen Description BLOOD LEFT HAND  Final   Special Requests BOTTLES DRAWN AEROBIC AND ANAEROBIC 10CC  Final   Culture  Setup Time   Final    04/05/2014 17:38 Performed at [facility]   Final    NO GROWTH 5 DAYS Performed at [facility]   Report Status 04/11/2014 FINAL  Final  MRSA PCR Screening     Status: None   Collection Time: 04/05/14  2:44 PM  Result Value Ref Range Status   MRSA by PCR NEGATIVE NEGATIVE Final    Comment:        The GeneXpert MRSA Assay (FDA approved for NASAL specimens only), is one component of a comprehensive MRSA colonization surveillance program. It is not intended to diagnose MRSA infection nor to guide or monitor treatment for MRSA infections.  Culture, respiratory (NON-Expectorated)     Status: None   Collection Time: 04/07/14 12:16 PM  Result Value Ref Range Status   Specimen Description TRACHEAL ASPIRATE  Final   Special Requests NONE  Final   Gram Stain   Final    FEW WBC PRESENT,BOTH PMN AND MONONUCLEAR NO SQUAMOUS EPITHELIAL CELLS SEEN NO ORGANISMS SEEN Performed at [facility]   Culture NO GROWTH Performed at [facility]  Final   Report Status 04/09/2014 FINAL  Final  Culture, Urine     Status: None   Collection Time: 04/15/14 12:08 PM  Result Value Ref Range Status   Specimen Description URINE, CLEAN CATCH  Final   Special Requests NONE  Final   Culture  Setup Time   Final    04/15/2014 17:18 Performed at [facility]  Colony Count   Final    >=100,000 COLONIES/ML Performed at [facility]   Culture   Final    ESCHERICHIA COLI Performed at [facility]   Report Status 04/17/2014 FINAL  Final   Organism ID, Bacteria ESCHERICHIA COLI   Final      Susceptibility   Escherichia coli - MIC*    AMPICILLIN 8 SENSITIVE Sensitive     CEFAZOLIN <=4 SENSITIVE Sensitive     CEFTRIAXONE <=1 SENSITIVE Sensitive     CIPROFLOXACIN >=4 RESISTANT Resistant     GENTAMICIN <=1 SENSITIVE Sensitive     LEVOFLOXACIN >=8 RESISTANT Resistant     NITROFURANTOIN <=16 SENSITIVE Sensitive     TOBRAMYCIN <=1 SENSITIVE Sensitive     TRIMETH/SULFA >=320 RESISTANT Resistant     PIP/TAZO <=4 SENSITIVE Sensitive     * ESCHERICHIA COLI    Coagulation Studies:  Recent Labs  09/30/16 0940  LABPROT 13.6  INR 1.04    Imaging: Ct Head Wo Contrast  Result Date: 09/30/2016 CLINICAL DATA:  Right-sided weakness EXAM: CT HEAD WITHOUT CONTRAST TECHNIQUE: Contiguous axial images were obtained from the base of the skull through the vertex without intravenous contrast. COMPARISON:  MRI 07/24/2009 FINDINGS: Brain: Acute infarct in the left MCA territory. Low-density is seen in the insula extending into the left frontal temporal lobe and left parietal lobe. There is hemorrhagic transformation with blood in the left frontal temporal lobe and in the left parietal lobe. Generalized atrophy. Negative for hydrocephalus. Low-density in the parietal white matter bilaterally appears chronic. Brainstem and cerebellum intact. Vascular: Hyperdense vessel in the sylvian fissure compatible with thrombosed left MCA branch leading to acute infarct. M1 does not appear hyperdense. Skull: Negative Sinuses/Orbits: Mucosal edema in the paranasal sinuses. NG tube in place. Normal orbit. 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 No shift of the midline structures. Atrophy and chronic ischemic change in the white matter bilaterally These results were called by telephone at the time of interpretation on 09/30/2016 at 11:51 am to Dr. Rockne Menghini , who verbally acknowledged these results. Electronically Signed   By: Franchot Gallo M.D.   On: 09/30/2016 11:52    Dg Chest Portable 1 View  Result Date: 09/30/2016 CLINICAL DATA:  Hypoxia with endotracheal tube adjustment EXAM: PORTABLE CHEST 1 VIEW COMPARISON:  Study obtained earlier in the day FINDINGS: Endotracheal tube tip is now 3.6 cm above the carina. No pneumothorax. There is airspace consolidation in the left base laterally. Lungs elsewhere are clear. Heart is mildly enlarged with pulmonary vascularity within normal limits. No adenopathy. There is atherosclerotic calcification in the aorta. No evident bone lesions. IMPRESSION: Endotracheal tube as described without pneumothorax. Left base airspace consolidation consistent with pneumonia. Lungs elsewhere clear. Stable cardiac silhouette. There is aortic atherosclerosis. Electronically Signed   By: Lowella Grip III M.D.   On: 09/30/2016 09:36   Dg Chest Portable 1 View  Result Date: 09/30/2016 CLINICAL DATA:  75 year old female found unresponsive this morning. Intubated. Initial encounter. EXAM: PORTABLE CHEST 1 VIEW COMPARISON:  09/16/2015 and earlier. FINDINGS: Portable AP semi upright view at 0915 hours. Endotracheal tube tip in good position about 2.5 cm above the carina. Enteric tube placed, tip not definitely included. Somewhat low lung volumes. Mild patchy opacity at the left lung base. Stable cardiomegaly and mediastinal contours. Calcified aortic atherosclerosis. No pneumothorax, pulmonary edema, or definite pleural effusion. IMPRESSION: 1. ET tube in good position. Enteric tube courses to the left abdomen. 2. Lower lung  volumes. Patchy left lung base opacity could reflect atelectasis, but consider also aspiration or bronchopneumonia in this setting. 3.  Calcified aortic atherosclerosis. Electronically Signed   By: Genevie Ann M.D.   On: 09/30/2016 09:34   Ct Angio Chest/abd/pel For Dissection W And/or Wo Contrast  Result Date: 09/30/2016 CLINICAL DATA:  Respiratory distress, right-sided weakness EXAM: CT ANGIOGRAPHY CHEST, ABDOMEN AND PELVIS  TECHNIQUE: Multidetector CT imaging through the chest, abdomen and pelvis was performed using the standard protocol during bolus administration of intravenous contrast. Multiplanar reconstructed images and MIPs were obtained and reviewed to evaluate the vascular anatomy. CONTRAST:  100 mL Isovue 370 COMPARISON:  None. FINDINGS: CTA CHEST FINDINGS Cardiovascular: Preferential opacification of the thoracic aorta. No evidence of thoracic aortic aneurysm or dissection. Thoracic aortic atherosclerosis major vessels arising from the aortic arch are patent. Bilateral vertebral arteries are patent. Dense coronary artery atherosclerosis in the LAD, left main, and circumflex. Normal heart size. No pericardial effusion. Mediastinum/Nodes: No enlarged mediastinal, hilar, or axillary lymph nodes. Thyroid gland, trachea, and esophagus demonstrate no significant findings. Lungs/Pleura: Bibasilar atelectasis. Lingular airspace disease which may reflect atelectasis versus pneumonia. No pleural effusion or pneumothorax. Musculoskeletal: No acute osseous abnormality. Degenerative disc disease throughout the thoracic spine. No aggressive lytic or sclerotic osseous lesion. Degenerative disc disease with disc space narrowing of the lower cervical spine. Review of the MIP images confirms the above findings. CTA ABDOMEN AND PELVIS FINDINGS VASCULAR Aorta: Normal caliber aorta without aneurysm, dissection, vasculitis or significant stenosis. Abdominal aortic atherosclerosis. Celiac: Patent without evidence of aneurysm, dissection, vasculitis or significant stenosis. Mild atherosclerotic plaque at the origin. SMA: Patent without evidence of aneurysm, dissection, vasculitis or significant stenosis. Mild atherosclerosis at the origin and slightly more distally. Renals: Both renal arteries are patent without evidence of aneurysm, dissection, vasculitis, fibromuscular dysplasia or significant stenosis. IMA: Patent without evidence of aneurysm,  dissection, vasculitis or significant stenosis. Veins: No obvious venous abnormality within the limitations of this arterial phase study. Iliacs: Atherosclerotic disease involving bilateral common iliac arteries extending to the external iliac arteries. 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 Review of the MIP images confirms the above findings. NON-VASCULAR Hepatobiliary: 10 mm hypodensity in the right hepatic lobe of indeterminate etiology likely representing a small cyst unchanged from 05/20/2014. Liver is otherwise unremarkable. No gallstones, gallbladder wall thickening, or biliary dilatation. Pancreas: Unremarkable. No pancreatic ductal dilatation or surrounding inflammatory changes. Spleen: Normal in size without focal abnormality. Adrenals/Urinary Tract: Adrenal glands are unremarkable. Kidneys are normal, without renal calculi, focal lesion, or hydronephrosis. Bladder is decompressed with a Foley catheter present. Stomach/Bowel: Stomach is within normal limits. Sigmoid diverticulosis without evidence of diverticulitis. Appendix appears normal. No evidence of bowel wall thickening, distention, or inflammatory changes. Lymphatic:  No lymphandeopathy. Reproductive: Status post hysterectomy. No adnexal masses. Other: Fat containing umbilical hernia.  No abdominopelvic ascites. Musculoskeletal: Nondisplaced fracture of the right superior and inferior pubic rami. Incidentally noted, severe radiocarpal and first [facility] joint osteoarthritis bilaterally. Review of the MIP images confirms the above findings. IMPRESSION: 1. No aortic aneurysm or dissection. 2. Diverticulosis without evidence diverticulitis. 3. Nondisplaced fracture of the right superior and inferior pubic rami. 4. Complete occlusion of the left  external iliac artery just distal to the origin with reconstitution of the right common femoral artery through the hypogastric artery. 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 5.  Aortic Atherosclerosis (ICD10-170.0) 6. Severe coronary artery atherosclerosis. 7. Lingular airspace disease which may reflect atelectasis versus pneumonia. Electronically Signed   By: Kathreen Devoid   On: 09/30/2016 12:06        Assessment/Plan:  75 yo female was noted to be making gurgling noises by family and was not moving right side.  Last seen at baseline prior to going to bed on 11/02  CTH found to have a large L MCA evolving infract with some hemorraghic transformation  CTH already showing some signs of edema and mild ventricle compression  - MRI brain - CTA head and neck - SBP < 160 - Will need mannitol or 3% due to swelling and frequent Na checks to hold for Na > 155  Discussed prognosis with family at bedside. Niece does not know code status but I explained the prognosis with her given the severity and size of infract. Explained that this will not have a good prognosis given her age, being left sided and she will probably be nursing home bound if she survives.

## 2016-09-30 NOTE — ED Notes (Signed)
Pt transported to and from CT with RN and RT without incident.

## 2016-09-30 NOTE — ED Notes (Signed)
Oral care and suctioning completed

## 2016-09-30 NOTE — Progress Notes (Signed)
eLink Physician-Brief Progress Note Patient Name: [name] DOB: 1941-06-02 MRN: [id number]   Date of Service  09/30/2016  HPI/Events of Note  Fever - Temp = 101.1 F. Already cultured and started on Vancomycin and Zosyn.   eICU Interventions  Will order: 1. Tylenol Suppository 650 mg PR Q 6 hours PRN Temp > 101.0 F.     Intervention Category Intermediate Interventions: Infection - evaluation and management  Sommer,Steven Eugene 09/30/2016, 4:12 PM

## 2016-09-30 NOTE — ED Notes (Signed)
Ortho at bedside.

## 2016-09-30 NOTE — Progress Notes (Signed)
Pt. transported uneventfully from ED-Trauma-C to 3MW-02.

## 2016-09-30 NOTE — ED Triage Notes (Signed)
Pt BIB [facility] for evaluation of respiratory distress and altered mental status discovered by family this AM. Pt. Was seen last night at baseline. EMS noted no purposeful movement to R side and leaning towards R side. Pt. Lives with family at home, typically is AxO x4, ambulatory at baseline, no home O2.

## 2016-10-01 ENCOUNTER — Inpatient Hospital Stay (HOSPITAL_COMMUNITY): Payer: Medicare Other

## 2016-10-01 DIAGNOSIS — J9602 Acute respiratory failure with hypercapnia: Secondary | ICD-10-CM

## 2016-10-01 DIAGNOSIS — J9601 Acute respiratory failure with hypoxia: Secondary | ICD-10-CM

## 2016-10-01 LAB — LIPID PANEL
CHOL/HDL RATIO: 4.1 ratio
Cholesterol: 201 mg/dL — ABNORMAL HIGH (ref 0–200)
HDL: 49 mg/dL (ref >40)
LDL CALC: 131 mg/dL — AB (ref 0–99)
TRIGLYCERIDES: 104 mg/dL (ref <150)
VLDL: 21 mg/dL (ref 0–40)

## 2016-10-01 LAB — CBC
HEMATOCRIT: 34 % — AB (ref 36.0–46.0)
Hemoglobin: 11.2 g/dL — ABNORMAL LOW (ref 12.0–15.0)
MCH: 30.1 pg (ref 26.0–34.0)
MCHC: 32.9 g/dL (ref 30.0–36.0)
MCV: 91.4 fL (ref 78.0–100.0)
Platelets: 168 10*3/uL (ref 150–400)
RBC: 3.72 MIL/uL — ABNORMAL LOW (ref 3.87–5.11)
RDW: 14.1 % (ref 11.5–15.5)
WBC: 12.6 10*3/uL — ABNORMAL HIGH (ref 4.0–10.5)

## 2016-10-01 LAB — URINE CULTURE: CULTURE: NO GROWTH

## 2016-10-01 LAB — SODIUM
Sodium: 139 mmol/L (ref 135–145)
Sodium: 140 mmol/L (ref 135–145)
Sodium: 140 mmol/L (ref 135–145)

## 2016-10-01 LAB — BASIC METABOLIC PANEL
Anion gap: 8 (ref 5–15)
BUN: 33 mg/dL — AB (ref 6–20)
CO2: 20 mmol/L — ABNORMAL LOW (ref 22–32)
Calcium: 9.4 mg/dL (ref 8.9–10.3)
Chloride: 105 mmol/L (ref 101–111)
Creatinine, Ser: 1.63 mg/dL — ABNORMAL HIGH (ref 0.44–1.00)
GFR calc Af Amer: 34 mL/min — ABNORMAL LOW (ref >60)
GFR, EST NON AFRICAN AMERICAN: 30 mL/min — AB (ref >60)
GLUCOSE: 259 mg/dL — AB (ref 65–99)
POTASSIUM: 4 mmol/L (ref 3.5–5.1)
Sodium: 133 mmol/L — ABNORMAL LOW (ref 135–145)

## 2016-10-01 LAB — GLUCOSE, CAPILLARY
GLUCOSE-CAPILLARY: 127 mg/dL — AB (ref 65–99)
GLUCOSE-CAPILLARY: 261 mg/dL — AB (ref 65–99)
Glucose-Capillary: 262 mg/dL — ABNORMAL HIGH (ref 65–99)
Glucose-Capillary: 126 mg/dL — ABNORMAL HIGH (ref 65–99)
Glucose-Capillary: 178 mg/dL — ABNORMAL HIGH (ref 65–99)
Glucose-Capillary: 162 mg/dL — ABNORMAL HIGH (ref 65–99)
Glucose-Capillary: 148 mg/dL — ABNORMAL HIGH (ref 65–99)

## 2016-10-01 LAB — MRSA PCR SCREENING: MRSA by PCR: NEGATIVE

## 2016-10-01 MED ORDER — SODIUM CHLORIDE 3 % IV SOLN
30.0000 mL/h | INTRAVENOUS | Status: DC
  Administered 2016-10-01 – 2016-10-02 (×2): 30 mL/h via INTRAVENOUS
  Filled 2016-10-01 (×4): qty 500

## 2016-10-01 MED ORDER — SODIUM CHLORIDE 3 % IV SOLN: INTRAVENOUS | Status: DC

## 2016-10-01 MED ORDER — ORAL CARE MOUTH RINSE
15.0000 mL | OROMUCOSAL | Status: DC
  Administered 2016-10-01 – 2016-10-03 (×25): 15 mL via OROMUCOSAL

## 2016-10-01 NOTE — Progress Notes (Signed)
Initial Nutrition Assessment  DOCUMENTATION CODES:  Not applicable  INTERVENTION:  If unable to extubate, or deemed unsafe to swallow, recommend initiation of TF.   Initiate TF via OGT/NGT with Glucerna 1.2 at goal rate of 60 ml/h (1440 ml per day) to provide 1728 kcals (193 from sedation), 86 gm protein, 1159 ml free water daily.  NUTRITION DIAGNOSIS:  Inadequate oral intake related to inability to eat as evidenced by NPO status.  GOAL:  Patient will meet greater than or equal to 90% of their needs  MONITOR:  Labs, Vent status, I & O's, Diet advancement  REASON FOR ASSESSMENT:  Ventilator    ASSESSMENT:  75 y/o female w/ extensive PMHx including Anemia, Anxiety, CAD, COPD, CAD, HTN, DM2, MI, GERD, Hiatal Hernia, Cirrhosis. Noted by family to be making gurgling noises and not moving right side. CT showed acute L MCA infarct and fx of Rt superior and inferior pubic rami.   RN reports pt to undergo CT of head, potentially may extubate.   Pt intubated, Sedated. Distant family member present, but was unable to provide any history.   NFPE: Abdomen soft, non distended. No fa/tmuscle wasting. Mild edema  Patient is currently intubated on ventilator support MV: 10.3 L/min Temp (24hrs), Avg:100.9 F (38.3 C), Min:99.1 F (37.3 C), Max:101.7 F (38.7 C)  Propofol: 7.3 ml/hr = 193 kcals  Labs:  Hyperglycemia Medications: Insulin, PPI, Propofol, IVF   Recent Labs Lab 09/30/16 0940 09/30/16 0953 10/01/16 0413  NA 138 138 133*  K 4.4 4.4 4.0  CL 103 102 105  CO2 25  --  20*  BUN 27* 30* 33*  CREATININE 1.45* 1.40* 1.63*  CALCIUM 9.6  --  9.4  GLUCOSE 270* 266* 259*   Diet Order:  Diet NPO time specified  Skin: Dry  Last BM:  Unknown  Height:  Ht Readings from Last 1 Encounters:  09/30/16 5\' 7"  (1.702 m)   Weight:  Wt Readings from Last 1 Encounters:  10/01/16 188 lb 15 oz (85.7 kg)   Wt Readings from Last 10 Encounters:  10/01/16 188 lb 15 oz (85.7 kg)   05/25/16 178 lb (80.7 kg)  02/26/16 175 lb 11.2 oz (79.7 kg)  11/17/15 172 lb (78 kg)  11/06/15 176 lb 3.2 oz (79.9 kg)  10/06/15 174 lb (78.9 kg)  10/05/15 174 lb 4.8 oz (79.1 kg)  09/21/15 174 lb 5 oz (79.1 kg)  09/16/15 174 lb (78.9 kg)  08/19/15 175 lb 9.6 oz (79.7 kg)   Ideal Body Weight:  61.36 kg  BMI:  Body mass index is 29.59 kg/m.  Estimated Nutritional Needs:  Kcal:  1900 kcals Protein:  85-100 g Pro (1.4-1.6 g/kg ibw) Fluid:  >1.9 Liters  EDUCATION NEEDS:  No education needs identified at this time  Burtis Junes RD, LDN, [facility] Pager: [phone] 10/01/2016 11:25 AM

## 2016-10-01 NOTE — Progress Notes (Signed)
Patient agitated with turn.

## 2016-10-01 NOTE — Progress Notes (Signed)
STROKE TEAM PROGRESS NOTE   HISTORY OF PRESENT ILLNESS (per record) [name] is an 75 yo female was noted to be making gurgling noises by family and was not moving right side. Last seen at baseline prior to going to bed on 11/02   SUBJECTIVE (INTERVAL HISTORY) Her family arrived after rounds and I spoke with them at length.  Overall her condition is worsened. CT of the head revealed evolution of stroke and increased hemorrhage as well as cerebral edema.  Started Hypertonic saline   OBJECTIVE Temp:  [99.1 F (37.3 C)-101.7 F (38.7 C)] 99.5 F (37.5 C) (11/04 1830) Pulse Rate:  [49-101] 59 (11/04 1830) Cardiac Rhythm: Normal sinus rhythm (11/04 0800) Resp:  [0-30] 18 (11/04 1830) BP: (120-182)/(58-121) 144/63 (11/04 1830) SpO2:  [97 %-100 %] 100 % (11/04 1830) FiO2 (%):  [40 %] 40 % (11/04 1800) Weight:  [85.7 kg (188 lb 15 oz)] 85.7 kg (188 lb 15 oz) (11/04 0100)  CBC:   Recent Labs Lab 09/30/16 0940 09/30/16 0953 10/01/16 0413  WBC 14.0*  --  12.6*  NEUTROABS 12.3*  --   --   HGB 11.5* 12.2 11.2*  HCT 35.4* 36.0 34.0*  MCV 93.2  --  91.4  PLT 174  --  [id number]    Basic Metabolic Panel:   Recent Labs Lab 09/30/16 0940 09/30/16 0953 10/01/16 0413 10/01/16 1126 10/01/16 1603  NA 138 138 133* 139 140  K 4.4 4.4 4.0  --   --   CL 103 102 105  --   --   CO2 25  --  20*  --   --   GLUCOSE 270* 266* 259*  --   --   BUN 27* 30* 33*  --   --   CREATININE 1.45* 1.40* 1.63*  --   --   CALCIUM 9.6  --  9.4  --   --     Lipid Panel:     Component Value Date/Time   CHOL 201 (H) 10/01/2016 0413   TRIG 104 10/01/2016 0413   HDL 49 10/01/2016 0413   CHOLHDL 4.1 10/01/2016 0413   VLDL 21 10/01/2016 0413   LDLCALC 131 (H) 10/01/2016 0413   HgbA1c:  Lab Results  Component Value Date   HGBA1C 7.2 (H) 01/03/2013   Urine Drug Screen: No results found for: LABOPIA, COCAINSCRNUR, LABBENZ, AMPHETMU, THCU, LABBARB    IMAGING  Ct Head Wo  Contrast 09/30/2016 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 No shift of the midline structures. Atrophy and chronic ischemic change in the white matter bilaterally   Dg Chest Port 1 View 10/01/2016 Mild bibasilar atelectasis new from the prior exam. No other focal abnormality is seen.    Dg Chest Portable 1 View 09/30/2016 Endotracheal tube as described without pneumothorax. Left base airspace consolidation consistent with pneumonia. Lungs elsewhere clear. Stable cardiac silhouette. There is aortic atherosclerosis.     Dg Chest Portable 1 View 09/30/2016 1. ET tube in good position. Enteric tube courses to the left abdomen.  2. Lower lung volumes. Patchy left lung base opacity could reflect atelectasis, but consider also aspiration or bronchopneumonia in this setting.  3.  Calcified aortic atherosclerosis.   Ct Angio Chest/abd/pel For Dissection W And/or Wo Contrast 09/30/2016 1. No aortic aneurysm or dissection.  2. Diverticulosis without evidence diverticulitis.  3. Nondisplaced fracture of the right superior and inferior pubic rami.  4. 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  5.  Aortic Atherosclerosis (ICD10-170.0)  6. Severe coronary artery atherosclerosis.  7. Lingular airspace disease which may reflect atelectasis versus pneumonia.    PHYSICAL EXAM HEENT-  Normocephalic, no lesions, without obvious abnormality.  Normal external eye and conjunctiva.  Normal TM's bilaterally.  Normal auditory canals and external ears. Normal external nose, mucus membranes and septum.  Normal pharynx. Cardiovascular- regular rate and rhythm, S1, S2 normal, no murmur, click, rub or gallop, pulses palpable throughout   Lungs- chest clear, no wheezing, rales, normal symmetric air  entry, Heart exam - S1, S2 normal, no murmur, no gallop, rate regular Abdomen- soft, non-tender; bowel sounds normal; no masses,  no organomegaly   Neurological Examination Mental Status: On propofol Did not open eyes to noxious stimuli; Pupils with poor reaction; poor OCR; + corneals minimal gag Withdraws in b/l LE's; less on the right Withdraws in the L UE No movement in the R UE   ASSESSMENT/PLAN [name] is a 75 y.o. female with history of diabetes mellitus, tobacco use, peripheral vascular disease, coronary artery disease with previous MI, hypertension, hyperlipidemia, history of GI bleed, COPD, anemia, anxiety, ankylosing spondylitis, and carotid artery disease, presenting with right hemiparesis. She did not receive IV t-PA due to  Late presentation.  Stroke:  Dominant infarct possibly embolic from an unknown source.  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   Carotid Doppler - pending  2D Echo - pending  LDL - 131  HgbA1c pending  VTE prophylaxis - SCDs Diet NPO time specified  aspirin 81 mg daily prior to admission, now on No antithrombotic - secondary to hemorrhagic transformation.  Ongoing aggressive stroke risk factor management  Therapy recommendations: - pending  Disposition: - Pending  Hypertension  Stable  Permissive hypertension (OK if < 220/120) but gradually normalize in 5-7 days  Long-term BP goal normotensive  Hyperlipidemia  Home meds:  Pravachol 10 mg twice each week - not resumed in hospital  LDL 131, goal < 70  History of statin intolerance  Continue statin at discharge  Diabetes  HgbA1c pending, goal < 7.0  Uncontrolled  Other Stroke Risk Factors  Advanced age  Former smoker. Quit 3 years ago.  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   Plavix allergy - statin intolerance  Intubated / NPO  Cerebral edema  Hospital day # 1  CRITICAL CARE NEUROLOGY ATTENDING NOTE Patient was seen and examined by me personally. I independently viewed imaging studies, participated in medical decision making and plan of care. The laboratory and radiographic studies were personally reviewed by me.  ROS pertinent positives could not be fully documented due to LOC  Assessment and plan completed by me personally and fully documented above.  Condition is worsened due to cerebral edema and more hemorrhage  This patient is critically ill and at significant risk of neurological worsening, death and  care requires constant monitoring of vital signs, hemodynamics,respiratory and cardiac monitoring, extensive review of multiple databases, frequent neurological assessment, discussion with family, other specialists and medical decision making of high complexity.  This critical care time does not reflect procedure time, or teaching time or supervisory time of PA/NP/Med Resident etc. but could involve care discussion time.  I spent 30 minutes of Neurocritical Care time in the care of  this patient.  SIGNED BY: Dr. Elissa Hefty     To contact Stroke Continuity provider, please refer to [email or web address] After hours, contact General Neurology

## 2016-10-01 NOTE — Progress Notes (Signed)
PULMONARY / CRITICAL CARE MEDICINE   Name: [name] MRN: [id number] DOB: Jan 05, 1941    ADMISSION DATE:  09/30/2016  REFERRING MD:  Dr. Cletus Gash, ER  CHIEF COMPLAINT:  Rt sided weakness. Left MCA stroke  SUBJECTIVE:  Awake, pulls at ETT. Does not f/c VITAL SIGNS: BP (!) 145/90   Pulse 63   Temp 99.7 F (37.6 C)   Resp 20   Ht 5\' 7"  (1.702 m)   Wt 188 lb 15 oz (85.7 kg)   SpO2 100%   BMI 29.59 kg/m   HEMODYNAMICS:    VENTILATOR SETTINGS: Vent Mode: PRVC FiO2 (%):  [40 %] 40 % Set Rate:  [20 bmp] 20 bmp Vt Set:  [500 mL] 500 mL PEEP:  [5 cmH20] 5 cmH20 Plateau Pressure:  [19 cmH20-24 cmH20] 24 cmH20  INTAKE / OUTPUT: I/O last 3 completed shifts: In: 1010.8 [I.V.:760.8; IV Piggyback:250] Out: 950 [Urine:950]  PHYSICAL EXAMINATION: General:  Agitated at times. Moves all ext. Does not f/c Neuro:  RASS -1, moves all ext. Does not f/c. Seems to have receptive aphasia  HEENT:  Pupils mid point and reactive; orally intubated Cardiovascular:  Regular, no murmur Lungs:  No wheeze/rales Abdomen:  Soft, decreased BS, non tender Musculoskeletal:  Lt foot TMA, no edema Skin:  Chronic venous stasis changes  LABS:  BMET  Recent Labs Lab 09/30/16 0940 09/30/16 0953 10/01/16 0413  NA 138 138 133*  K 4.4 4.4 4.0  CL 103 102 105  CO2 25  --  20*  BUN 27* 30* 33*  CREATININE 1.45* 1.40* 1.63*  GLUCOSE 270* 266* 259*    Electrolytes  Recent Labs Lab 09/30/16 0940 10/01/16 0413  CALCIUM 9.6 9.4    CBC  Recent Labs Lab 09/30/16 0940 09/30/16 0953 10/01/16 0413  WBC 14.0*  --  12.6*  HGB 11.5* 12.2 11.2*  HCT 35.4* 36.0 34.0*  PLT 174  --  168    Coag's  Recent Labs Lab 09/30/16 0940  APTT 24  INR 1.04    Sepsis Markers  Recent Labs Lab 09/30/16 0940  09/30/16 0954 09/30/16 1222 09/30/16 1341  LATICACIDVEN  --   < > 2.05* 1.9 1.92*  PROCALCITON <0.10  --   --   --   --   < > = values in this interval not  displayed.  ABG  Recent Labs Lab 09/30/16 1056  PHART 7.320*  PCO2ART 54.5*  PO2ART 427.0*    Liver Enzymes  Recent Labs Lab 09/30/16 0940  AST 30  ALT 21  ALKPHOS 66  BILITOT 0.7  ALBUMIN 3.3*    Cardiac Enzymes No results for input(s): TROPONINI, PROBNP in the last 168 hours.  Glucose  Recent Labs Lab 09/30/16 1720 10/01/16 0051 10/01/16 0315 10/01/16 0735  GLUCAP 295* 261* 262* 126*    Imaging Ct Head Wo Contrast  Result Date: 09/30/2016 CLINICAL DATA:  Right-sided weakness EXAM: CT HEAD WITHOUT CONTRAST TECHNIQUE: Contiguous axial images were obtained from the base of the skull through the vertex without intravenous contrast. COMPARISON:  MRI 07/24/2009 FINDINGS: Brain: Acute infarct in the left MCA territory. Low-density is seen in the insula extending into the left frontal temporal lobe and left parietal lobe. There is hemorrhagic transformation with blood in the left frontal temporal lobe and in the left parietal lobe. Generalized atrophy. Negative for hydrocephalus. Low-density in the parietal white matter bilaterally appears chronic. Brainstem and cerebellum intact. Vascular: Hyperdense vessel in the sylvian fissure compatible with thrombosed left MCA branch leading  to acute infarct. M1 does not appear hyperdense. Skull: Negative Sinuses/Orbits: Mucosal edema in the paranasal sinuses. NG tube in place. Normal orbit. 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 No shift of the midline structures. Atrophy and chronic ischemic change in the white matter bilaterally These results were called by telephone at the time of interpretation on 09/30/2016 at 11:51 am to Dr. Rockne Menghini , who verbally acknowledged these results. Electronically Signed   By: Franchot Gallo M.D.   On: 09/30/2016 11:52   Dg Chest Port 1 View  Result Date: 10/01/2016 CLINICAL DATA:  Respiratory failure EXAM: PORTABLE CHEST 1 VIEW COMPARISON:  09/30/2016 FINDINGS:  Cardiac shadow is enlarged. Endotracheal tube and nasogastric catheter are noted. The endotracheal tube has been advanced somewhat and now lies approximately 1.7 cm above the carina. The lungs are well aerated bilaterally with mild patchy they basilar atelectasis. No new focal infiltrate is seen. No bony abnormality is noted. IMPRESSION: Mild bibasilar atelectasis new from the prior exam. No other focal abnormality is seen. Electronically Signed   By: Inez Catalina M.D.   On: 10/01/2016 07:21   Ct Angio Chest/abd/pel For Dissection W And/or Wo Contrast  Result Date: 09/30/2016 CLINICAL DATA:  Respiratory distress, right-sided weakness EXAM: CT ANGIOGRAPHY CHEST, ABDOMEN AND PELVIS TECHNIQUE: Multidetector CT imaging through the chest, abdomen and pelvis was performed using the standard protocol during bolus administration of intravenous contrast. Multiplanar reconstructed images and MIPs were obtained and reviewed to evaluate the vascular anatomy. CONTRAST:  100 mL Isovue 370 COMPARISON:  None. FINDINGS: CTA CHEST FINDINGS Cardiovascular: Preferential opacification of the thoracic aorta. No evidence of thoracic aortic aneurysm or dissection. Thoracic aortic atherosclerosis major vessels arising from the aortic arch are patent. Bilateral vertebral arteries are patent. Dense coronary artery atherosclerosis in the LAD, left main, and circumflex. Normal heart size. No pericardial effusion. Mediastinum/Nodes: No enlarged mediastinal, hilar, or axillary lymph nodes. Thyroid gland, trachea, and esophagus demonstrate no significant findings. Lungs/Pleura: Bibasilar atelectasis. Lingular airspace disease which may reflect atelectasis versus pneumonia. No pleural effusion or pneumothorax. Musculoskeletal: No acute osseous abnormality. Degenerative disc disease throughout the thoracic spine. No aggressive lytic or sclerotic osseous lesion. Degenerative disc disease with disc space narrowing of the lower cervical spine.  Review of the MIP images confirms the above findings. CTA ABDOMEN AND PELVIS FINDINGS VASCULAR Aorta: Normal caliber aorta without aneurysm, dissection, vasculitis or significant stenosis. Abdominal aortic atherosclerosis. Celiac: Patent without evidence of aneurysm, dissection, vasculitis or significant stenosis. Mild atherosclerotic plaque at the origin. SMA: Patent without evidence of aneurysm, dissection, vasculitis or significant stenosis. Mild atherosclerosis at the origin and slightly more distally. Renals: Both renal arteries are patent without evidence of aneurysm, dissection, vasculitis, fibromuscular dysplasia or significant stenosis. IMA: Patent without evidence of aneurysm, dissection, vasculitis or significant stenosis. Veins: No obvious venous abnormality within the limitations of this arterial phase study. Iliacs: Atherosclerotic disease involving bilateral common iliac arteries extending to the external iliac arteries. 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 Review of the MIP images confirms the above findings. NON-VASCULAR Hepatobiliary: 10 mm hypodensity in the right hepatic lobe of indeterminate etiology likely representing a small cyst unchanged from 05/20/2014. Liver is otherwise unremarkable. No gallstones, gallbladder wall thickening, or biliary dilatation. Pancreas: Unremarkable. No pancreatic ductal dilatation or surrounding inflammatory changes. Spleen:  Normal in size without focal abnormality. Adrenals/Urinary Tract: Adrenal glands are unremarkable. Kidneys are normal, without renal calculi, focal lesion, or hydronephrosis. Bladder is decompressed with a Foley catheter present. Stomach/Bowel: Stomach is within normal limits. Sigmoid diverticulosis without evidence of diverticulitis.  Appendix appears normal. No evidence of bowel wall thickening, distention, or inflammatory changes. Lymphatic:  No lymphandeopathy. Reproductive: Status post hysterectomy. No adnexal masses. Other: Fat containing umbilical hernia.  No abdominopelvic ascites. Musculoskeletal: Nondisplaced fracture of the right superior and inferior pubic rami. 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 Review of the MIP images confirms the above findings. IMPRESSION: 1. No aortic aneurysm or dissection. 2. Diverticulosis without evidence diverticulitis. 3. Nondisplaced fracture of the right superior and inferior pubic rami. 4. 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 5.  Aortic Atherosclerosis (ICD10-170.0) 6. Severe coronary artery atherosclerosis. 7. Lingular airspace disease which may reflect atelectasis versus pneumonia. Electronically Signed   By: Kathreen Devoid   On: 09/30/2016 12:06   PCXR: basilar atx bilaterally ETT good position   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, aneurysmal dilation of Lt common iliac, atherosclerosis  CULTURES: Blood 11/03 >> Urine 11/03 >>   ANTIBIOTICS: Vancomycin 11/03 >> 11/03 Zosyn 11/03 >> 11/03   SIGNIFICANT EVENTS: 11/03 Admit, neuro consulted  LINES/TUBES: ETT 11/03 >>   DISCUSSION: 75 yo female former smoker with altered mental status, HTN emergency, Rt sided weakness, compromised airway from Lt MCA infarct.  Also found to have non displaced Rt pubic rami fracture. She is currently awake, agitated but moves all  extremities. Have spoken w/ neuro-team. They are starting 3% saline. IF CT head imaging stable we can consider trial of extubation. IF progressing will cont supportive care including start tube feed trials. Will coordinate extubation w/ neuro team. Of course the variable will be if she can protect her airway after extubation. I have discussed this w/ her family.   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 ->prognosis poor - neurology consulted - RASS goal 0 to -1  - starting 3% for cerebral edema  HTN emergency. - goal SBP < 160  Compromised airway in setting of CVA. Hx of COPD/asthma. - full vent support - f/u CXR - scheduled pulmicort, duoneb  Pubic rami fracture. - consulted orthopedics-->for ROV 4 weeks after admit - WBAT w/ walker   DM type II. - SSI  Lactic acidosis >> no evidence for infection. - f/u lactic acid cleared - d/c monitoring   Nutrition. Hx of GERD.  - start tube feeds   A on C AKI - monitor renal fx, urine outpt - increase NS to 75 ml/hr  DVT prophylaxis - SCDs SUP - Protonix Goals of care - Full code  Updated pt's niece at bedside. Erick Colace ACNP-BC [place] Pager # [phone] OR # [phone] if no answer

## 2016-10-01 NOTE — Progress Notes (Signed)
Pt. Was transported to CT & back to 3M02 without any complications.

## 2016-10-01 NOTE — Progress Notes (Signed)
Patient agitated with mouth care.

## 2016-10-02 ENCOUNTER — Inpatient Hospital Stay (HOSPITAL_COMMUNITY): Payer: Medicare Other

## 2016-10-02 LAB — COMPREHENSIVE METABOLIC PANEL
ALBUMIN: 2.7 g/dL — AB (ref 3.5–5.0)
ALT: 20 U/L (ref 14–54)
ANION GAP: 9 (ref 5–15)
AST: 34 U/L (ref 15–41)
Alkaline Phosphatase: 46 U/L (ref 38–126)
BILIRUBIN TOTAL: 1 mg/dL (ref 0.3–1.2)
BUN: 32 mg/dL — ABNORMAL HIGH (ref 6–20)
CALCIUM: 8.6 mg/dL — AB (ref 8.9–10.3)
CO2: 22 mmol/L (ref 22–32)
Chloride: 118 mmol/L — ABNORMAL HIGH (ref 101–111)
Creatinine, Ser: 1.44 mg/dL — ABNORMAL HIGH (ref 0.44–1.00)
GFR calc non Af Amer: 34 mL/min — ABNORMAL LOW (ref >60)
GFR, EST AFRICAN AMERICAN: 40 mL/min — AB (ref >60)
GLUCOSE: 128 mg/dL — AB (ref 65–99)
POTASSIUM: 3.6 mmol/L (ref 3.5–5.1)
SODIUM: 149 mmol/L — AB (ref 135–145)
TOTAL PROTEIN: 5.7 g/dL — AB (ref 6.5–8.1)

## 2016-10-02 LAB — BLOOD GAS, ARTERIAL
ACID-BASE DEFICIT: 0.6 mmol/L (ref 0.0–2.0)
Acid-base deficit: 3 mmol/L — ABNORMAL HIGH (ref 0.0–2.0)
BICARBONATE: 22.8 mmol/L (ref 20.0–28.0)
Bicarbonate: 21.8 mmol/L (ref 20.0–28.0)
DRAWN BY: 44166
DRAWN BY: 331761
FIO2: 40
FIO2: 40
LHR: 20 {breaths}/min
MECHVT: 500 mL
MECHVT: 500 mL
O2 SAT: 96.1 %
O2 SAT: 95.7 %
PATIENT TEMPERATURE: 99.1
PCO2 ART: 41.3 mmHg (ref 32.0–48.0)
PEEP/CPAP: 5 cmH2O
PEEP: 5 cmH2O
PH ART: 7.461 — AB (ref 7.350–7.450)
PH ART: 7.343 — AB (ref 7.350–7.450)
PO2 ART: 88.6 mmHg (ref 83.0–108.0)
Patient temperature: 98.6
RATE: 16 resp/min
pCO2 arterial: 32.4 mmHg (ref 32.0–48.0)
pO2, Arterial: 80 mmHg — ABNORMAL LOW (ref 83.0–108.0)

## 2016-10-02 LAB — CBC
HCT: 31.1 % — ABNORMAL LOW (ref 36.0–46.0)
Hemoglobin: 9.9 g/dL — ABNORMAL LOW (ref 12.0–15.0)
MCH: 29.8 pg (ref 26.0–34.0)
MCHC: 31.8 g/dL (ref 30.0–36.0)
MCV: 93.7 fL (ref 78.0–100.0)
Platelets: 154 10*3/uL (ref 150–400)
RBC: 3.32 MIL/uL — ABNORMAL LOW (ref 3.87–5.11)
RDW: 14.4 % (ref 11.5–15.5)
WBC: 9.8 10*3/uL (ref 4.0–10.5)

## 2016-10-02 LAB — GLUCOSE, CAPILLARY
GLUCOSE-CAPILLARY: 133 mg/dL — AB (ref 65–99)
GLUCOSE-CAPILLARY: 135 mg/dL — AB (ref 65–99)
GLUCOSE-CAPILLARY: 129 mg/dL — AB (ref 65–99)
Glucose-Capillary: 130 mg/dL — ABNORMAL HIGH (ref 65–99)
Glucose-Capillary: 132 mg/dL — ABNORMAL HIGH (ref 65–99)
Glucose-Capillary: 184 mg/dL — ABNORMAL HIGH (ref 65–99)

## 2016-10-02 LAB — MAGNESIUM: MAGNESIUM: 1.8 mg/dL (ref 1.7–2.4)

## 2016-10-02 LAB — SODIUM
SODIUM: 144 mmol/L (ref 135–145)
SODIUM: 146 mmol/L — AB (ref 135–145)
Sodium: 147 mmol/L — ABNORMAL HIGH (ref 135–145)
Sodium: 148 mmol/L — ABNORMAL HIGH (ref 135–145)

## 2016-10-02 LAB — HEMOGLOBIN A1C
Hgb A1c MFr Bld: 8.1 % — ABNORMAL HIGH (ref 4.8–5.6)
Mean Plasma Glucose: 186 mg/dL

## 2016-10-02 LAB — PHOSPHORUS: PHOSPHORUS: 3.1 mg/dL (ref 2.5–4.6)

## 2016-10-02 MED ORDER — PROPOFOL 1000 MG/100ML IV EMUL
5.0000 ug/kg/min | INTRAVENOUS | Status: DC
  Administered 2016-10-02 – 2016-10-03 (×2): 10 ug/kg/min via INTRAVENOUS
  Administered 2016-10-04 – 2016-10-05 (×3): 15 ug/kg/min via INTRAVENOUS
  Administered 2016-10-05: 10 ug/kg/min via INTRAVENOUS
  Administered 2016-10-06 (×2): 20 ug/kg/min via INTRAVENOUS
  Administered 2016-10-06: 12 ug/kg/min via INTRAVENOUS
  Administered 2016-10-07: 40 ug/kg/min via INTRAVENOUS
  Administered 2016-10-07: 30 ug/kg/min via INTRAVENOUS
  Administered 2016-10-07: 40 ug/kg/min via INTRAVENOUS
  Administered 2016-10-07: 10 ug/kg/min via INTRAVENOUS
  Administered 2016-10-08: 40 ug/kg/min via INTRAVENOUS
  Administered 2016-10-08: 25 ug/kg/min via INTRAVENOUS
  Administered 2016-10-08 – 2016-10-09 (×4): 40 ug/kg/min via INTRAVENOUS
  Filled 2016-10-02 (×17): qty 100
  Filled 2016-10-02: qty 200

## 2016-10-02 MED ORDER — ORAL CARE MOUTH RINSE
15.0000 mL | Freq: Four times a day (QID) | OROMUCOSAL | Status: DC
  Administered 2016-10-02 – 2016-10-20 (×70): 15 mL via OROMUCOSAL

## 2016-10-02 MED ORDER — MIDAZOLAM HCL 2 MG/2ML IJ SOLN
INTRAMUSCULAR | Status: AC
  Filled 2016-10-02: qty 2

## 2016-10-02 MED ORDER — FENTANYL CITRATE (PF) 100 MCG/2ML IJ SOLN: 50.0000 ug | INTRAMUSCULAR | Status: DC | PRN

## 2016-10-02 MED ORDER — CHLORHEXIDINE GLUCONATE 0.12% ORAL RINSE (MEDLINE KIT)
15.0000 mL | Freq: Two times a day (BID) | OROMUCOSAL | Status: DC
  Administered 2016-10-02 – 2016-10-20 (×33): 15 mL via OROMUCOSAL

## 2016-10-02 MED ORDER — SODIUM CHLORIDE 0.9% FLUSH: 10.0000 mL | INTRAVENOUS | Status: DC | PRN

## 2016-10-02 MED ORDER — FAMOTIDINE IN NACL 20-0.9 MG/50ML-% IV SOLN
20.0000 mg | INTRAVENOUS | Status: DC
  Administered 2016-10-02: 20 mg via INTRAVENOUS
  Filled 2016-10-02: qty 50

## 2016-10-02 MED ORDER — SODIUM CHLORIDE 0.9% FLUSH
10.0000 mL | Freq: Two times a day (BID) | INTRAVENOUS | Status: DC
  Administered 2016-10-02: 10 mL
  Administered 2016-10-02: 30 mL
  Administered 2016-10-03 – 2016-10-09 (×11): 10 mL
  Administered 2016-10-10: 20 mL
  Administered 2016-10-11 – 2016-10-17 (×9): 10 mL

## 2016-10-02 MED ORDER — DOCUSATE SODIUM 50 MG/5ML PO LIQD
100.0000 mg | Freq: Two times a day (BID) | ORAL | Status: DC
  Administered 2016-10-04 – 2016-10-17 (×22): 100 mg via ORAL
  Filled 2016-10-02 (×22): qty 10

## 2016-10-02 MED ORDER — VITAL HIGH PROTEIN PO LIQD
1000.0000 mL | ORAL | Status: DC
  Administered 2016-10-02 – 2016-10-03 (×2): 1000 mL

## 2016-10-02 MED ORDER — FENTANYL CITRATE (PF) 100 MCG/2ML IJ SOLN
100.0000 ug | Freq: Once | INTRAMUSCULAR | Status: AC
Start: 2016-10-02 — End: 2016-10-02
  Administered 2016-10-02: 100 ug via INTRAVENOUS

## 2016-10-02 MED ORDER — FENTANYL CITRATE (PF) 100 MCG/2ML IJ SOLN
INTRAMUSCULAR | Status: AC
  Filled 2016-10-02: qty 2

## 2016-10-02 MED ORDER — SUCCINYLCHOLINE CHLORIDE 20 MG/ML IJ SOLN
85.0000 mg | Freq: Once | INTRAMUSCULAR | Status: AC
  Administered 2016-10-02: 85 mg via INTRAVENOUS

## 2016-10-02 MED ORDER — PRO-STAT SUGAR FREE PO LIQD
30.0000 mL | Freq: Two times a day (BID) | ORAL | Status: DC
  Administered 2016-10-02 – 2016-10-03 (×3): 30 mL
  Filled 2016-10-02 (×3): qty 30

## 2016-10-02 MED ORDER — BISACODYL 10 MG RE SUPP
10.0000 mg | Freq: Once | RECTAL | Status: AC
Start: 2016-10-02 — End: 2016-10-02
  Administered 2016-10-02: 10 mg via RECTAL

## 2016-10-02 MED ORDER — POLYETHYLENE GLYCOL 3350 17 G PO PACK
17.0000 g | PACK | Freq: Every day | ORAL | Status: DC | PRN
  Filled 2016-10-02: qty 1

## 2016-10-02 MED ORDER — FENTANYL CITRATE (PF) 100 MCG/2ML IJ SOLN
50.0000 ug | INTRAMUSCULAR | Status: DC | PRN
  Administered 2016-10-03 – 2016-10-16 (×24): 50 ug via INTRAVENOUS
  Administered 2016-10-16: 25 ug via INTRAVENOUS
  Administered 2016-10-16 – 2016-10-17 (×2): 50 ug via INTRAVENOUS
  Filled 2016-10-02 (×28): qty 2

## 2016-10-02 MED ORDER — ROCURONIUM BROMIDE 50 MG/5ML IV SOLN
60.0000 mg | Freq: Once | INTRAVENOUS | Status: DC
  Filled 2016-10-02: qty 6

## 2016-10-02 MED ORDER — ETOMIDATE 2 MG/ML IV SOLN
20.0000 mg | Freq: Once | INTRAVENOUS | Status: AC
  Administered 2016-10-02: 20 mg via INTRAVENOUS

## 2016-10-02 MED ORDER — MIDAZOLAM HCL 2 MG/2ML IJ SOLN
2.0000 mg | Freq: Once | INTRAMUSCULAR | Status: AC
  Administered 2016-10-02: 2 mg via INTRAVENOUS

## 2016-10-02 NOTE — Progress Notes (Signed)
Patient's restraints were discontinued shortly after extubation at 0915. Patient no longer interfering with lines or equipment. At the discontinuation of restraints, the order was not discontinued. Will continue to monitor.

## 2016-10-02 NOTE — Progress Notes (Signed)
Patient anxious prior to extubation. Apresoline given.

## 2016-10-02 NOTE — Procedures (Signed)
Intubation Procedure Note LACE CRUEY [id number] 1941-09-25  Procedure: Intubation Indications: Airway protection and maintenance  Procedure Details Consent: Risks of procedure as well as the alternatives and risks of each were explained to the (patient/caregiver).  Consent for procedure obtained. Time Out: Verified patient identification, verified procedure, site/side was marked, verified correct patient position, special equipment/implants available, medications/allergies/relevent history reviewed, required imaging and test results available.  Performed  Maximum sterile technique was used including antiseptics, cap, gloves, hand hygiene and mask.  MAC and 3  # 3 glide # 7 tube  Evaluation Hemodynamic Status: BP stable throughout; O2 sats: stable throughout Patient's Current Condition: stable Complications: No apparent complications Patient did tolerate procedure well. Chest X-ray ordered to verify placement.  CXR: pending.   Clementeen Graham 10/02/2016  Erick Colace ACNP-BC [place] Pager # [phone] OR # [phone] if no answer

## 2016-10-02 NOTE — Procedures (Signed)
Extubation Procedure Note  Patient Details:   Name: YURI CICCARELLI DOB: 01/30/1941 MRN: [id number]   Airway Documentation:     Evaluation  O2 sats: stable throughout Complications: No apparent complications Patient did tolerate procedure well. Bilateral Breath Sounds: Clear   Yes   Pt. Was extubated to a 4L [place] without any complications, dyspnea or stridor noted.   [place], Eddie North 10/02/2016, 8:45 AM

## 2016-10-02 NOTE — Progress Notes (Signed)
200 ml of Fentanyl in a bag of 25,000 mcg in 250 ml wasted in sink and witnessed by Roselyn Reef W.,RN. Will continue to monitor.

## 2016-10-02 NOTE — Progress Notes (Signed)
eLink Physician-Brief Progress Note Patient Name: [name] DOB: 1941-10-21 MRN: [id number]   Date of Service  10/02/2016  HPI/Events of Note  Multiple issues: 1. Agitation - Intubated and mechanically ventilated and 2. Need for stress ulcer prophylaxis.    eICU Interventions  Will order: 1. Restart Propofol IV infusion. Titrate to RASS = -2 to -3. 2. Pepcid 20 mg IV Q 12 hours.      Intervention Category Minor Interventions: Agitation / anxiety - evaluation and management  Clatie Kessen [place] 10/02/2016, 3:28 PM

## 2016-10-02 NOTE — Progress Notes (Signed)
PULMONARY / CRITICAL CARE MEDICINE   Name: [name] MRN: [id number] DOB: 06/27/1941    ADMISSION DATE:  09/30/2016  REFERRING MD:  Dr. Cletus Gash, ER  CHIEF COMPLAINT:  Rt sided weakness. Left MCA stroke  SUBJECTIVE:  Awake, pulls at ETT. Does not f/c VITAL SIGNS: BP (!) 146/62   Pulse (!) 52   Temp 97.9 F (36.6 C)   Resp 20   Ht 5\' 7"  (1.702 m)   Wt 188 lb 15 oz (85.7 kg)   SpO2 100%   BMI 29.59 kg/m   HEMODYNAMICS:    VENTILATOR SETTINGS: Vent Mode: PRVC FiO2 (%):  [40 %] 40 % Set Rate:  [0 bmp-20 bmp] 20 bmp Vt Set:  [500 mL] 500 mL PEEP:  [5 cmH20] 5 cmH20 Plateau Pressure:  [18 cmH20-22 cmH20] 22 cmH20  INTAKE / OUTPUT: I/O last 3 completed shifts: In: 2733 [I.V.:2733] Out: 1175 [Urine:1175]  PHYSICAL EXAMINATION: General:  Agitated at times. Moves all ext. Does not f/c Neuro:  RASS -1, moves all ext. Does not f/c. Seems to have receptive aphasia  HEENT:  Pupils mid point and reactive; orally intubated Cardiovascular:  Regular, no murmur Lungs:  No wheeze/rales Abdomen:  Soft, decreased BS, non tender Musculoskeletal:  Lt foot TMA, no edema Skin:  Chronic venous stasis changes  LABS:  BMET  Recent Labs Lab 09/30/16 0940 09/30/16 0953 10/01/16 0413  10/01/16 2250 10/02/16 0403 10/02/16 0613  NA 138 138 133*  < > 140 149* 144  K 4.4 4.4 4.0  --   --  3.6  --   CL 103 102 105  --   --  118*  --   CO2 25  --  20*  --   --  22  --   BUN 27* 30* 33*  --   --  32*  --   CREATININE 1.45* 1.40* 1.63*  --   --  1.44*  --   GLUCOSE 270* 266* 259*  --   --  128*  --   < > = values in this interval not displayed.  Electrolytes  Recent Labs Lab 09/30/16 0940 10/01/16 0413 10/02/16 0403  CALCIUM 9.6 9.4 8.6*    CBC  Recent Labs Lab 09/30/16 0940 09/30/16 0953 10/01/16 0413 10/02/16 0403  WBC 14.0*  --  12.6* 9.8  HGB 11.5* 12.2 11.2* 9.9*  HCT 35.4* 36.0 34.0* 31.1*  PLT 174  --  168 154    Coag's  Recent Labs Lab  09/30/16 0940  APTT 24  INR 1.04    Sepsis Markers  Recent Labs Lab 09/30/16 0940  09/30/16 0954 09/30/16 1222 09/30/16 1341  LATICACIDVEN  --   < > 2.05* 1.9 1.92*  PROCALCITON <0.10  --   --   --   --   < > = values in this interval not displayed.  ABG  Recent Labs Lab 09/30/16 1056 10/02/16 0148  PHART 7.320* 7.461*  PCO2ART 54.5* 32.4  PO2ART 427.0* 80.0*    Liver Enzymes  Recent Labs Lab 09/30/16 0940 10/02/16 0403  AST 30 34  ALT 21 20  ALKPHOS 66 46  BILITOT 0.7 1.0  ALBUMIN 3.3* 2.7*    Cardiac Enzymes No results for input(s): TROPONINI, PROBNP in the last 168 hours.  Glucose  Recent Labs Lab 10/01/16 0735 10/01/16 1126 10/01/16 1531 10/01/16 1941 10/01/16 2327 10/02/16 0322  GLUCAP 126* 178* 162* 148* 127* 133*    Imaging Ct Head Wo Contrast  Result Date: 10/01/2016  CLINICAL DATA:  Stroke. EXAM: CT HEAD WITHOUT CONTRAST TECHNIQUE: Contiguous axial images were obtained from the base of the skull through the vertex without intravenous contrast. COMPARISON:  09/30/2016 FINDINGS: Brain: An acute left MCA infarct is again seen. 2.2 cm focus of parenchymal hemorrhage in the left frontal lobe appears minimally larger than on the prior study (previously 2.0 cm). There is increased petechial hemorrhage in the posterior left temporal lobe. Less hypodense appearance of some portions of the infarct compared to yesterday's study may reflect a combination of reperfusion and petechial hemorrhage. The infarct again involves the left frontal, temporal, and parietal lobes as well as insula without evidence of significant interval extension. There is slightly increased effacement of the left lateral ventricle without significant midline shift or evidence of hydrocephalus. Overall cerebral volume is within normal limits for age. A cavum septum pellucidum et vergae is noted. Patchy cerebral white matter hypodensities bilaterally are unchanged and compatible with mild  chronic small vessel ischemic disease. Vascular: Calcified atherosclerosis at the skullbase. Hyperdense left MCA branch vessel on the sylvian fissure as previously seen. Skull: No acute fracture. Mild diffuse skull heterogeneity with numerous subcentimeter lucencies, nonspecific. Sinuses/Orbits: Chronic sinusitis with unchanged partial left maxillary sinus opacification. Chronic defects in the anterior and posterior walls of the right frontal sinus. Clear mastoid air cells. Prior bilateral cataract extraction. Other: None. IMPRESSION: 1. Evolving acute left MCA infarct with hemorrhagic transformation. Mildly increased petechial hemorrhage and slightly increased size of 2.2 cm left frontal lobe parenchymal hematoma. 2. Slightly increased mass effect without midline shift or hydrocephalus. Electronically Signed   By: Logan Bores M.D.   On: 10/01/2016 12:57   PCXR: basilar atx bilaterally ETT good position   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, aneurysmal dilation of Lt common iliac, atherosclerosis CT head 11/4: . Evolving acute left MCA infarct with hemorrhagic transformation. Mildly increased petechial hemorrhage and slightly increased size of 2.2 cm left frontal lobe parenchymal hematoma. 2. Slightly increased mass effect without midline shift or hydrocephalus. CULTURES: Blood 11/03 >> Urine 11/03 >> neg  ANTIBIOTICS: Vancomycin 11/03 >> 11/03 Zosyn 11/03 >> 11/03   SIGNIFICANT EVENTS: 11/03 Admit, neuro consulted 11/4 3% started.  11/5 passed SBT, extubated  LINES/TUBES: ETT 11/03 >> 11/5  DISCUSSION: 75 yo female former smoker with altered mental status, HTN emergency, Rt sided weakness, compromised airway from Lt MCA infarct.  Also found to have non displaced Rt pubic rami  fracture. She is currently awake, agitated but moves all extremities. Started 3% saline on 11/4, MS and exam has improved. Passing SBT. Will cont 3% NS, extubate and watch closely. I suspect she will have extensive expressive and receptive aphasia. Will cont routine stroke care s/p extubation.    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 ->prognosis poor - neurology following - cont 3% for cerebral edema - trend Na and creatinine   HTN emergency. - goal SBP < 160  Compromised airway in setting of CVA. Hx of COPD/asthma. - extubate - aspiration and reflux precautions - early mobilization  - NPO until MS allows   Pubic rami fracture. - consulted orthopedics-->for ROV 4 weeks after admit - WBAT w/ walker   DM type II. - SSI  Constipation  Nutrition. Hx of GERD.  - NPO w/ pending extubation  - dulcolax sup  A on C AKI - monitor renal fx, urine outpt - cont current IVFs  DVT prophylaxis - SCDs SUP - Protonix Goals of care - Full code  Updated pt's niece at bedside. Erick Colace ACNP-BC [place] Pager # [phone] OR # [phone] if no answer

## 2016-10-02 NOTE — Progress Notes (Signed)
Peripherally Inserted Central Catheter/Midline Placement  The IV Nurse has discussed with the patient and/or persons authorized to consent for the patient, the purpose of this procedure and the potential benefits and risks involved with this procedure.  The benefits include less needle sticks, lab draws from the catheter, and the patient may be discharged home with the catheter. Risks include, but not limited to, infection, bleeding, blood clot (thrombus formation), and puncture of an artery; nerve damage and irregular heartbeat and possibility to perform a PICC exchange if needed/ordered by physician.  Alternatives to this procedure were also discussed.  Bard Power PICC patient education guide, fact sheet on infection prevention and patient information card has been provided to patient /or left at bedside.    PICC/Midline Placement Documentation  PICC Triple Lumen [id number] PICC Left Basilic 45 cm 1 cm (Active)  Indication for Insertion or Continuance of Line Vasoactive infusions 10/02/2016 11:53 AM  Exposed Catheter (cm) 1 cm 10/02/2016 11:53 AM  Site Assessment Clean;Dry;Intact 10/02/2016 11:53 AM  Lumen #1 Status Flushed;Saline locked;Blood return noted 10/02/2016 11:53 AM  Lumen #2 Status Flushed;Saline locked;Blood return noted 10/02/2016 11:53 AM  Lumen #3 Status Flushed;Saline locked;Blood return noted 10/02/2016 11:53 AM  Dressing Type Transparent 10/02/2016 11:53 AM  Dressing Status Clean;Dry;Intact 10/02/2016 11:53 AM  Dressing Change Due 10/09/16 10/02/2016 11:53 AM       Gordan Payment 10/02/2016, 11:55 AM

## 2016-10-02 NOTE — Progress Notes (Signed)
STROKE TEAM PROGRESS NOTE   HISTORY OF PRESENT ILLNESS (per record) [name] is an 75 yo female was noted to be making gurgling noises by family and was not moving right side. Last seen at baseline prior to going to bed on 11/02   SUBJECTIVE (INTERVAL HISTORY) Her family arrived after rounds and I spoke with them at length.  Overall her condition is worsened. CT of the head revealed evolution of stroke and increased hemorrhage as well as cerebral edema.  Started Hypertonic saline   OBJECTIVE Temp:  [97.5 F (36.4 C)-99.7 F (37.6 C)] 97.9 F (36.6 C) (11/05 0700) Pulse Rate:  [52-101] 52 (11/05 0728) Cardiac Rhythm: Normal sinus rhythm (11/04 2000) Resp:  [8-22] 20 (11/05 0728) BP: (120-182)/(54-121) 146/62 (11/05 0728) SpO2:  [97 %-100 %] 100 % (11/05 0729) FiO2 (%):  [40 %] 40 % (11/05 0729)  CBC:   Recent Labs Lab 09/30/16 0940  10/01/16 0413 10/02/16 0403  WBC 14.0*  --  12.6* 9.8  NEUTROABS 12.3*  --   --   --   HGB 11.5*  < > 11.2* 9.9*  HCT 35.4*  < > 34.0* 31.1*  MCV 93.2  --  91.4 93.7  PLT 174  --  168 154  < > = values in this interval not displayed.  Basic Metabolic Panel:   Recent Labs Lab 10/01/16 0413  10/02/16 0403 10/02/16 0613  NA 133*  < > 149* 144  K 4.0  --  3.6  --   CL 105  --  118*  --   CO2 20*  --  22  --   GLUCOSE 259*  --  128*  --   BUN 33*  --  32*  --   CREATININE 1.63*  --  1.44*  --   CALCIUM 9.4  --  8.6*  --   < > = values in this interval not displayed.  Lipid Panel:     Component Value Date/Time   CHOL 201 (H) 10/01/2016 0413   TRIG 104 10/01/2016 0413   HDL 49 10/01/2016 0413   CHOLHDL 4.1 10/01/2016 0413   VLDL 21 10/01/2016 0413   LDLCALC 131 (H) 10/01/2016 0413   HgbA1c:  Lab Results  Component Value Date   HGBA1C 7.2 (H) 01/03/2013   Urine Drug Screen: No results found for: LABOPIA, COCAINSCRNUR, LABBENZ, AMPHETMU, THCU, LABBARB    IMAGING  Ct Head Wo Contrast 09/30/2016 Acute left MCA  infarct with hemorrhagic transformation. No shift of the midline structures. Atrophy and chronic ischemic change in the white matter bilaterally   Dg Chest Port 1 View 10/01/2016 Mild bibasilar atelectasis new from the prior exam. No other focal abnormality is seen.    Dg Chest Portable 1 View 09/30/2016 Endotracheal tube as described without pneumothorax. Left base airspace consolidation consistent with pneumonia. Lungs elsewhere clear. Stable cardiac silhouette. There is aortic atherosclerosis.     Dg Chest Portable 1 View 09/30/2016 1. ET tube in good position. Enteric tube courses to the left abdomen.  2. Lower lung volumes. Patchy left lung base opacity could reflect atelectasis, but consider also aspiration or bronchopneumonia in this setting.  3.  Calcified aortic atherosclerosis.   Ct Angio Chest/abd/pel For Dissection W And/or Wo Contrast 09/30/2016 1. No aortic aneurysm or dissection.  2. Diverticulosis without evidence diverticulitis.  3. Nondisplaced fracture of the right superior and inferior pubic rami.  4. 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  5.  Aortic Atherosclerosis (ICD10-170.0)  6. Severe coronary artery atherosclerosis.  7. Lingular airspace disease which may reflect atelectasis versus pneumonia.    PHYSICAL EXAM HEENT-  Normocephalic, no lesions, without obvious abnormality.  Normal external eye and conjunctiva. Patient intubated Cardiovascular- regular rate and rhythm, S1, S2 normal, no murmur, click, rub or gallop, pulses palpable throughout   Lungs- scattered rhonchi Heart exam - S1, S2 normal, no murmur, no gallop, rate regular Abdomen- firm and slightly distended Extremities: No C/C/E    Neurological Examination Mental Status: Propofol paused;  Fentanyl continued for this exam Opens eyes spontaneously; Pupils with poor reaction; limited OCRs + corneals; strong gag Withdraws in b/l LE's; less on the right Withdraws in the L UE No movement in the R UE  ASSESSMENT/PLAN [name] is a 75 y.o. female with history of diabetes mellitus, tobacco use, peripheral vascular disease, coronary artery disease with previous MI, hypertension, hyperlipidemia, history of GI bleed, COPD, anemia, anxiety, ankylosing spondylitis, and carotid artery disease, presenting with right hemiparesis. She did not receive IV t-PA due to  Late presentation.  Stroke:  Dominant infarct possibly embolic from an unknown source.  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   Carotid Doppler - pending  2D Echo - pending  LDL - 131  HgbA1c pending  VTE prophylaxis - SCDs Diet NPO time specified  aspirin 81 mg daily prior to admission, now on No antithrombotic - secondary to hemorrhagic transformation.  Ongoing aggressive stroke risk factor management  Therapy recommendations: - pending  Disposition: - Pending  Hypertension  Stable  Permissive hypertension (OK if < 220/120) but gradually normalize in 5-7 days  Long-term BP goal normotensive  Hyperlipidemia  Home meds:  Pravachol 10 mg twice each week - not resumed in hospital  LDL 131, goal < 70  History of statin intolerance  Continue statin at discharge  Diabetes  HgbA1c pending, goal < 7.0  Uncontrolled  Other Stroke Risk Factors  Advanced age  Former smoker. Quit 3 years ago.  Obesity, Body mass index is 29.59 kg/m., recommend weight loss, diet and exercise as appropriate   Coronary artery disease   Other Active Problems  Low-grade fevers - IV antibiotics - WBCs - 12.6-->9.8  Kidney disease - BUN 33; creatinine 1.63-->1.44  Complete occlusion of the  left external iliac artery just distal to the origin with reconstitution  Nondisplaced fracture of the right superior and inferior pubic rami.   Plavix allergy - statin intolerance  Intubated / NPO  Cerebral edema  Hospital day # 2  CRITICAL CARE NEUROLOGY ATTENDING NOTE Patient was seen and examined by me personally. I independently viewed imaging studies, participated in medical decision making and plan of care. The laboratory and radiographic studies were personally reviewed by me.  ROS pertinent positives could not be fully documented due to LOC  Assessment and plan completed by me personally:  Continue on 3% and follow sodiums; hold if >155  CCM following CXRs and will evaluate for possible extubation today  Changed colace to BID; added Miralax to prn options  PICC line ordered  Condition is unchanged.  Continue to monitor closely for neuro changes to indicate cerebral edema or more hemorrhage  This patient is  critically ill and at significant risk of neurological worsening, death and care requires constant monitoring of vital signs, hemodynamics,respiratory and cardiac monitoring, extensive review of multiple databases, frequent neurological assessment, discussion with family, other specialists and medical decision making of high complexity.  This critical care time does not reflect procedure time, or teaching time or supervisory time of PA/NP/Med Resident etc. but could involve care discussion time.  I spent 30 minutes of Neurocritical Care time in the care of  this patient.  SIGNED BY: Dr. Elissa Hefty     To contact Stroke Continuity provider, please refer to [email or web address] After hours, contact General Neurology

## 2016-10-02 NOTE — Progress Notes (Signed)
Afternoon eval  Interval: - Extubated earlier this am - Not able to cough up secretions.  - increased respiratory effort and accessory use.   BP (!) 156/70   Pulse 80   Temp 98.6 F (37 C)   Resp (!) 21   Ht 5\' 7"  (1.702 m)   Wt 188 lb 15 oz (85.7 kg)   SpO2 100%   BMI 29.59 kg/m   General: restless, agitated. . Moves all ext. Does not f/c.  Neuro:  RASS +3, moves all ext. Does not f/c. Seems to have both receptive aphasia  HEENT:  Pupils mid point and reactive Cardiovascular:  Regular, no murmur Lungs:  diffuse rhonchi, some upper airway wheeze. Marked accessory use  Abdomen:  Soft, decreased BS, non tender Musculoskeletal:  Lt foot TMA, no edema Skin:  Chronic venous stasis changes  IP  Inability to protect airway w/ resultant acute respiratory failure.  -in setting of acute stroke I am concerned about the impact that this could have on her stroke recovery. She has clearly deteriorated since extubation earlier this am.  Plan -re-intubate/ventilate -start tube feeds -resume PAD protocol -cont supportive care - may need trach  Erick Colace ACNP-BC [place] Pager # [phone] OR # [phone] if no answer  30 minutes PCCM time

## 2016-10-03 ENCOUNTER — Inpatient Hospital Stay (HOSPITAL_COMMUNITY): Payer: Medicare Other

## 2016-10-03 ENCOUNTER — Encounter (HOSPITAL_COMMUNITY): Payer: Medicare Other

## 2016-10-03 DIAGNOSIS — I638 Other cerebral infarction: Secondary | ICD-10-CM

## 2016-10-03 DIAGNOSIS — J9600 Acute respiratory failure, unspecified whether with hypoxia or hypercapnia: Secondary | ICD-10-CM

## 2016-10-03 DIAGNOSIS — J9621 Acute and chronic respiratory failure with hypoxia: Secondary | ICD-10-CM

## 2016-10-03 DIAGNOSIS — I6789 Other cerebrovascular disease: Secondary | ICD-10-CM

## 2016-10-03 DIAGNOSIS — J189 Pneumonia, unspecified organism: Secondary | ICD-10-CM

## 2016-10-03 DIAGNOSIS — J181 Lobar pneumonia, unspecified organism: Secondary | ICD-10-CM

## 2016-10-03 LAB — COMPREHENSIVE METABOLIC PANEL
ALBUMIN: 3 g/dL — AB (ref 3.5–5.0)
ALK PHOS: 53 U/L (ref 38–126)
ALT: 30 U/L (ref 14–54)
AST: 45 U/L — AB (ref 15–41)
Anion gap: 7 (ref 5–15)
BILIRUBIN TOTAL: 0.9 mg/dL (ref 0.3–1.2)
BUN: 32 mg/dL — AB (ref 6–20)
CO2: 22 mmol/L (ref 22–32)
CREATININE: 1.28 mg/dL — AB (ref 0.44–1.00)
Calcium: 9 mg/dL (ref 8.9–10.3)
Chloride: 121 mmol/L — ABNORMAL HIGH (ref 101–111)
GFR calc Af Amer: 46 mL/min — ABNORMAL LOW (ref >60)
GFR, EST NON AFRICAN AMERICAN: 40 mL/min — AB (ref >60)
GLUCOSE: 219 mg/dL — AB (ref 65–99)
Potassium: 3.9 mmol/L (ref 3.5–5.1)
Sodium: 150 mmol/L — ABNORMAL HIGH (ref 135–145)
TOTAL PROTEIN: 6.1 g/dL — AB (ref 6.5–8.1)

## 2016-10-03 LAB — GLUCOSE, CAPILLARY
GLUCOSE-CAPILLARY: 190 mg/dL — AB (ref 65–99)
GLUCOSE-CAPILLARY: 192 mg/dL — AB (ref 65–99)
Glucose-Capillary: 199 mg/dL — ABNORMAL HIGH (ref 65–99)
Glucose-Capillary: 210 mg/dL — ABNORMAL HIGH (ref 65–99)
Glucose-Capillary: 214 mg/dL — ABNORMAL HIGH (ref 65–99)

## 2016-10-03 LAB — CBC
HEMATOCRIT: 31 % — AB (ref 36.0–46.0)
HEMOGLOBIN: 9.7 g/dL — AB (ref 12.0–15.0)
MCH: 30.1 pg (ref 26.0–34.0)
MCHC: 31.3 g/dL (ref 30.0–36.0)
MCV: 96.3 fL (ref 78.0–100.0)
Platelets: 162 10*3/uL (ref 150–400)
RBC: 3.22 MIL/uL — AB (ref 3.87–5.11)
RDW: 14.9 % (ref 11.5–15.5)
WBC: 8.9 10*3/uL (ref 4.0–10.5)

## 2016-10-03 LAB — MAGNESIUM
MAGNESIUM: 2.2 mg/dL (ref 1.7–2.4)
Magnesium: 2.2 mg/dL (ref 1.7–2.4)

## 2016-10-03 LAB — ECHOCARDIOGRAM COMPLETE
Height: 67 in
Weight: 2962.98 oz

## 2016-10-03 LAB — SODIUM
SODIUM: 153 mmol/L — AB (ref 135–145)
SODIUM: 153 mmol/L — AB (ref 135–145)
SODIUM: 152 mmol/L — AB (ref 135–145)

## 2016-10-03 LAB — PHOSPHORUS
PHOSPHORUS: 2.1 mg/dL — AB (ref 2.5–4.6)
Phosphorus: 2.5 mg/dL (ref 2.5–4.6)

## 2016-10-03 MED ORDER — PRO-STAT SUGAR FREE PO LIQD
30.0000 mL | Freq: Three times a day (TID) | ORAL | Status: DC
  Administered 2016-10-03 – 2016-10-09 (×19): 30 mL
  Filled 2016-10-03 (×20): qty 30

## 2016-10-03 MED ORDER — IOPAMIDOL (ISOVUE-370) INJECTION 76%
50.0000 mL | Freq: Once | INTRAVENOUS | Status: AC | PRN
  Administered 2016-10-03: 50 mL via INTRAVENOUS

## 2016-10-03 MED ORDER — FAMOTIDINE 40 MG/5ML PO SUSR
20.0000 mg | Freq: Every day | ORAL | Status: DC
  Administered 2016-10-03 – 2016-10-20 (×16): 20 mg
  Filled 2016-10-03 (×18): qty 2.5

## 2016-10-03 MED ORDER — CARVEDILOL 3.125 MG PO TABS
3.1250 mg | ORAL_TABLET | Freq: Two times a day (BID) | ORAL | Status: DC
  Administered 2016-10-03 – 2016-10-04 (×2): 3.125 mg via ORAL
  Filled 2016-10-03 (×2): qty 1

## 2016-10-03 MED ORDER — GLUCERNA 1.2 CAL PO LIQD
1000.0000 mL | ORAL | Status: DC
  Administered 2016-10-03 – 2016-10-09 (×6): 1000 mL
  Filled 2016-10-03 (×10): qty 1000

## 2016-10-03 MED ORDER — HEPARIN SODIUM (PORCINE) 5000 UNIT/ML IJ SOLN
5000.0000 [IU] | Freq: Three times a day (TID) | INTRAMUSCULAR | Status: DC
  Administered 2016-10-03 – 2016-10-12 (×27): 5000 [IU] via SUBCUTANEOUS
  Filled 2016-10-03 (×29): qty 1

## 2016-10-03 NOTE — Progress Notes (Signed)
Foley removed at 1639. Pt due to void by 2230. Adali Pennings, Rande Brunt, RN

## 2016-10-03 NOTE — Progress Notes (Signed)
OT Cancellation Note  Patient Details Name: [name] MRN: [id number] DOB: October 22, 1941   Cancelled Treatment:    Reason Eval/Treat Not Completed: Patient not medically ready. Pt currently vent support and RN requesting remain supine at this time. MD please order PT if appropriate for increase activity. MD please incr activity if appropriate.   OT to continue to follow acutely at this time  Peri Maris 10/03/2016, 1:18 PM

## 2016-10-03 NOTE — Progress Notes (Signed)
Nutrition Follow-up  INTERVENTION:   D/C Vital High Protein  Glucerna 1.2 @ 45 ml/hr (1080 ml/day) 30 ml Prostat TID Provides: 1596 kcal, 109 grams protein, and 869 ml H2O.  TF regimen and propofol at current rate providing 1730 total kcal/day   NUTRITION DIAGNOSIS:   Inadequate oral intake related to inability to eat as evidenced by NPO status. Ongoing.   GOAL:   Patient will meet greater than or equal to 90% of their needs Progressing.   MONITOR:   TF tolerance, Vent status, Labs  REASON FOR ASSESSMENT:   Consult Enteral/tube feeding initiation and management  ASSESSMENT:   75 y/o female w/ extensive PMHx including Anemia, Anxiety, CAD, COPD, CAD, HTN, DM2, MI, GERD, Hiatal Hernia, Cirrhosis. Noted by family to be making gurgling noises and not moving right side. CT showed acute L MCA infarct and fx of Rt superior and inferior pubic rami.   11/5 extubated then re-intubated Pt discussed during ICU rounds and with RN.   Patient is currently intubated on ventilator support MV: 9 L/min Temp (24hrs), Avg:99.3 F (37.4 C), Min:98.8 F (37.1 C), Max:99.9 F (37.7 C)  Propofol: 5.1 ml/hr provides 134 kcal per day from lipid Medications reviewed and include: colace, pepcid, novolog Labs reviewed: Na 153 CBG's: 210-214 Vital High Protein @ 40 ml/hr via OG tube (tip in stomach) Weight up 7 lb and 2.6 L positive  Diet Order:  Diet NPO time specified  Skin:  Reviewed, no issues  Last BM:  11/6  Height:   Ht Readings from Last 1 Encounters:  09/30/16 5\' 7"  (1.702 m)    Weight:   Wt Readings from Last 1 Encounters:  10/03/16 185 lb 3 oz (84 kg)  Admission weight: 178 lb (80.7 kg)  Ideal Body Weight:  61.36 kg  BMI:  Body mass index is 29 kg/m.  Estimated Nutritional Needs:   Kcal:  1700  Protein:  85-100 g Pro (1.4-1.6 g/kg ibw)  Fluid:  >1.9 Liters  EDUCATION NEEDS:   No education needs identified at this time  [place], [place],  [place] Pager [phone] After Hours Pager

## 2016-10-03 NOTE — Progress Notes (Signed)
Echocardiogram 2D Echocardiogram has been performed.  Darlina Sicilian M 10/03/2016, 9:16 AM

## 2016-10-03 NOTE — Progress Notes (Signed)
STROKE TEAM PROGRESS NOTE   SUBJECTIVE (INTERVAL HISTORY) Patient RN is at bedside. She was reintubated yesterday due to intolerance for extubation. She is awake but aphasia, but able to mimic. Right side neglect. On weaning protocol. On tube feeding, BP on the higher side.    OBJECTIVE Temp:  [98.4 F (36.9 C)-99.9 F (37.7 C)] 99.1 F (37.3 C) (11/06 0900) Pulse Rate:  [45-94] 80 (11/06 0900) Cardiac Rhythm: Normal sinus rhythm (11/06 0800) Resp:  [16-30] 24 (11/06 0900) BP: (112-188)/(54-101) 173/72 (11/06 0900) SpO2:  [100 %] 100 % (11/06 0900) FiO2 (%):  [40 %] 40 % (11/06 0732) Weight:  [185 lb 3 oz (84 kg)-187 lb 2.7 oz (84.9 kg)] 185 lb 3 oz (84 kg) (11/06 0500)  CBC:   Recent Labs Lab 09/30/16 0940  10/02/16 0403 10/03/16 0351  WBC 14.0*  < > 9.8 8.9  NEUTROABS 12.3*  --   --   --   HGB 11.5*  < > 9.9* 9.7*  HCT 35.4*  < > 31.1* 31.0*  MCV 93.2  < > 93.7 96.3  PLT 174  < > 154 162  < > = values in this interval not displayed.  Basic Metabolic Panel:   Recent Labs Lab 10/02/16 0403  10/02/16 1624  10/03/16 0351 10/03/16 0818  NA 149*  < > 147*  < > 150* 153*  K 3.6  --   --   --  3.9  --   CL 118*  --   --   --  121*  --   CO2 22  --   --   --  22  --   GLUCOSE 128*  --   --   --  219*  --   BUN 32*  --   --   --  32*  --   CREATININE 1.44*  --   --   --  1.28*  --   CALCIUM 8.6*  --   --   --  9.0  --   MG  --   --  1.8  --  2.2  --   PHOS  --   --  3.1  --  2.5  --   < > = values in this interval not displayed.  Lipid Panel:     Component Value Date/Time   CHOL 201 (H) 10/01/2016 0413   TRIG 104 10/01/2016 0413   HDL 49 10/01/2016 0413   CHOLHDL 4.1 10/01/2016 0413   VLDL 21 10/01/2016 0413   LDLCALC 131 (H) 10/01/2016 0413   HgbA1c:  Lab Results  Component Value Date   HGBA1C 8.1 (H) 10/01/2016   Urine Drug Screen: No results found for: LABOPIA, COCAINSCRNUR, LABBENZ, AMPHETMU, THCU, LABBARB    IMAGING I have personally reviewed the  radiological images below and agree with the radiology interpretations.  Ct Head Wo Contrast 09/30/2016 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 No shift of the midline structures. Atrophy and chronic ischemic change in the white matter bilaterally   Ct Angio Chest/abd/pel For Dissection W And/or Wo Contrast 09/30/2016 1. No aortic aneurysm or dissection.  2. Diverticulosis without evidence diverticulitis.  3. Nondisplaced fracture of the right superior and inferior pubic rami.  4. 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  5.  Aortic Atherosclerosis (ICD10-170.0)  6.  Severe coronary artery atherosclerosis.  7. Lingular airspace disease which may reflect atelectasis versus pneumonia.   Ct Angio Neck and head W Or Wo Contrast 10/03/2016 IMPRESSION: 1. Unchanged size of intraparenchymal hemorrhage within the left frontal lobe, with small amount of associated parietotemporal petechial or subarachnoid blood. No new mass effect or hydrocephalus. Hypoattenuation throughout the left MCA distribution is consistent with expected evolution. 2. No intracranial arterial occlusion. 3. 40% stenosis of the proximal left internal carotid artery secondary to noncalcified atherosclerotic plaque. 4. Bilateral vertebral artery origin narrowing that is at least moderate. 5. Multifocal mild-to-moderate narrowing along the courses of both vertebral arteries. 6. Aortic atherosclerosis.   Dg Chest Port 1 View 10/03/2016 IMPRESSION: 1. Slight worsening of bibasilar infiltrates or atelectasis. 2.   nasogastric tube to the stomach.   TTE - Left ventricle: The cavity size was normal. Systolic function was   mildly to moderately reduced. The estimated ejection fraction was   in the range of 40% to 45%.  There is akinesis of the   mid-apicalanteroseptal, anterior, anterolateral, lateral,   inferolateral, inferior, inferoseptal, and apical myocardium.   Doppler parameters are consistent with abnormal left ventricular   relaxation (grade 1 diastolic dysfunction). - Aortic valve: Trileaflet; mildly thickened, mildly calcified   leaflets. - Mitral valve: There was trivial regurgitation. - Left atrium: The atrium was mildly dilated. - Tricuspid valve: There was mild regurgitation. - Pulmonary arteries: Systolic pressure was moderately increased.   PA peak pressure: 51 mm Hg (S). - Pericardium, extracardiac: A small pericardial effusion was   identified circumferential to the heart. There was no evidence of   hemodynamic compromise. Impressions: - No cardiac source of emboli was indentified.  LE venous doppler pending  MRI brain pending   PHYSICAL EXAM General - Well nourished, well developed, intubated.  Ophthalmologic - Fundi not visualized due to noncooperation.  Cardiovascular - Regular rate and rhythm.  Neuro - awake, eyes open, not able to following commands, but able to pantomime. Blinking to the visual threat on the left but not on the right. May have right neglect. PERRL, able to attend both sides. Facial asymmetry can not be tested due to ET tube. Tongue midline inside mouth. LUE and LLE 4/5, RUE 3/5 and RLE 3-/5. DTR 1+ and no babinski bilaterally. Sensation, coordination and gait not tested.   ASSESSMENT/PLAN [name] is a 75 y.o. female with history of diabetes mellitus, tobacco use, peripheral vascular disease, coronary artery disease with previous MI, hypertension, hyperlipidemia, history of GI bleed presenting with right hemiparesis. She did not receive IV t-PA due to Late presentation.  Stroke:  Left MCA large infarct, embolic pattern, likely due to large vessel atherosclerosis given multiple uncontrolled stroke risk factors including DM, HTN, HLD, ED,  CAD/MI and left ICA stenosis. Cardioembolic cannot be ruled out.  Resultant  Right hemiparesis and aphasia  MRI  pending  CT - acute left MCA infarct with hemorrhagic transformation.   Repeat CT stable infarct and hemorrhagic transformation  CTA head and neck - left ICA 40% stenosis, no intracranial occlusion. Diffuse atherosclerosis  2D Echo - EF 40-45%  LE venous Doppler pending  Recommend 30 day cardiac event monitoring as outpatient to rule out A. fib  LDL - 131  HgbA1c 8.1  VTE prophylaxis - heparin subcutaneous Diet NPO time specified, on TF  aspirin 81 mg daily prior to admission, now on No antithrombotic - secondary to hemorrhagic transformation.  Ongoing aggressive stroke risk factor management  Therapy recommendations: -  pending  Disposition: - Pending  Respiratory failure  Not tolerating extubation  Re-intubated  On weaning trials  Hypertension  Stable  Resume home Corag  BP goal < 160 due to hemorrhagic transformation  Hyperlipidemia  Home meds:  Pravachol 10 mg twice each week - not resumed in hospital  LDL 131, goal < 70  Hold off due to hemorrhagic transformation  Diabetes  HgbA1c 8.1, goal < 7.0  Uncontrolled  SSI  CBG monitoring  Other Stroke Risk Factors  Advanced age  Former smoker. Quit 3 years ago.  Obesity, Body mass index is 29 kg/m., recommend weight loss, diet and exercise as appropriate   CAD/MI  PVD with left foot anterior amputation - Complete occlusion of the left external iliac artery just distal to the origin with reconstitution  Other Active Problems  Leukocytosis 12.6-->9.8->8.9  AKI - creatinine 1.63-->1.44->1.28  Nondisplaced fracture of the right superior and inferior pubic rami.   Plavix allergy  statin intolerance  Hospital day # 3  This patient is critically ill due to left MCA infarct with hemorrhagic transformation, respiratory failure requiring intubation, uncontrolled diabetes,  hypertensive urgency and at significant risk of neurological worsening, death form hematoma expansion, recurrent stroke, respiratory failure, heart failure, cerebral edema and brain herniation. This patient's care requires constant monitoring of vital signs, hemodynamics, respiratory and cardiac monitoring, review of multiple databases, neurological assessment, discussion with family, other specialists and medical decision making of high complexity. I spent 45 minutes of neurocritical care time in the care of this patient.  Rosalin Hawking, MD PhD Stroke Neurology 10/03/2016 4:49 PM

## 2016-10-03 NOTE — Progress Notes (Signed)
Patient transported to CT on 100% FIO2 on servo I vent and bought back on 100% FIO2 with no complications. Patient returned back to room and placed back on 40% FIO2. ETT is secure and patent.

## 2016-10-03 NOTE — Progress Notes (Signed)
PULMONARY / CRITICAL CARE MEDICINE   Name: ARIONNE PHARRIS MRN: [id number] DOB: [birth year]    ADMISSION DATE:  09/30/2016  REFERRING MD:  Dr. Cletus Gash, ER  CHIEF COMPLAINT:  Rt sided weakness. Left MCA stroke  SUBJECTIVE:  Awake, pulls at ETT. Does not f/c VITAL SIGNS: BP (!) 174/70   Pulse 84   Temp 99 F (37.2 C)   Resp (!) 30   Ht 5\' 7"  (1.702 m)   Wt 84 kg (185 lb 3 oz)   SpO2 100%   BMI 29.00 kg/m   HEMODYNAMICS:    VENTILATOR SETTINGS: Vent Mode: PSV;CPAP FiO2 (%):  [40 %] 40 % Set Rate:  [16 bmp] 16 bmp Vt Set:  [500 mL] 500 mL PEEP:  [5 cmH20] 5 cmH20 Pressure Support:  [10 cmH20] 10 cmH20 Plateau Pressure:  [14 cmH20-16 cmH20] 16 cmH20  INTAKE / OUTPUT: I/O last 3 completed shifts: In: 3671.6 [I.V.:3021.6; NG/GT:600; IV Piggyback:50] Out: [id number]; Emesis/NG output:40]  PHYSICAL EXAM General:  Agitated int Neuro:  RASS -1, moves all ext. Does not f/c HEENT:  Pupils mid point and reactive; orally intubated Cardiovascular:  s1 s2 Regular, no murmur Lungs:  Scattered ronchi Abdomen:  Soft, decreased BS, non tender Musculoskeletal:  Lt foot TMA, min edema Skin:  Chronic venous stasis changes  LABS:  BMET  Recent Labs Lab 10/01/16 0413  10/02/16 0403  10/02/16 1624 10/02/16 2209 10/03/16 0351  NA 133*  < > 149*  < > 147* 148* 150*  K 4.0  --  3.6  --   --   --  3.9  CL 105  --  118*  --   --   --  121*  CO2 20*  --  22  --   --   --  22  BUN 33*  --  32*  --   --   --  32*  CREATININE 1.63*  --  1.44*  --   --   --  1.28*  GLUCOSE 259*  --  128*  --   --   --  219*  < > = values in this interval not displayed.  Electrolytes  Recent Labs Lab 10/01/16 0413 10/02/16 0403 10/02/16 1624 10/03/16 0351  CALCIUM 9.4 8.6*  --  9.0  MG  --   --  1.8 2.2  PHOS  --   --  3.1 2.5    CBC  Recent Labs Lab 10/01/16 0413 10/02/16 0403 10/03/16 0351  WBC 12.6* 9.8 8.9  HGB 11.2* 9.9* 9.7*  HCT 34.0* 31.1* 31.0*  PLT 168 154 162     Coag's  Recent Labs Lab 09/30/16 0940  APTT 24  INR 1.04    Sepsis Markers  Recent Labs Lab 09/30/16 0940  09/30/16 0954 09/30/16 1222 09/30/16 1341  LATICACIDVEN  --   < > 2.05* 1.9 1.92*  PROCALCITON <0.10  --   --   --   --   < > = values in this interval not displayed.  ABG  Recent Labs Lab 09/30/16 1056 10/02/16 0148 10/02/16 1637  PHART 7.320* 7.461* 7.343*  PCO2ART 54.5* 32.4 41.3  PO2ART 427.0* 80.0* 88.6    Liver Enzymes  Recent Labs Lab 09/30/16 0940 10/02/16 0403 10/03/16 0351  AST 30 34 45*  ALT 21 20 30   ALKPHOS 66 46 53  BILITOT 0.7 1.0 0.9  ALBUMIN 3.3* 2.7* 3.0*    Cardiac Enzymes No results for input(s): TROPONINI, PROBNP in the last 168 hours.  Glucose  Recent Labs Lab 10/02/16 0838 10/02/16 1304 10/02/16 1602 10/02/16 2018 10/02/16 2354 10/03/16 0320  GLUCAP 130* 135* 129* 132* 184* 199*    Imaging Ct Angio Head W Or Wo Contrast  Result Date: 10/03/2016 CLINICAL DATA:  Left MCA infarct with hemorrhagic transformation. EXAM: CT ANGIOGRAPHY HEAD AND NECK TECHNIQUE: Multidetector CT imaging of the head and neck was performed using the standard protocol during bolus administration of intravenous contrast. Multiplanar CT image reconstructions and MIPs were obtained to evaluate the vascular anatomy. Carotid stenosis measurements (when applicable) are obtained utilizing NASCET criteria, using the distal internal carotid diameter as the denominator. CONTRAST:  50 mL Isovue 370 IV COMPARISON:  Head CT 10/01/2016 FINDINGS: CT HEAD FINDINGS Brain: Focus of intraparenchymal hemorrhage within the inferior left frontal lobe is unchanged in size measuring up to 2.2 cm. Extensive hypoattenuation within the left MCA distribution has increased from the prior study. Suspected small amount of left temporoparietal subarachnoid blood/petechial hemorrhage is unchanged. No midline shift. Mild mass effect on the left lateral ventricle is unchanged.  Again noted is a cavum septum pellucidum et vergae. Review of the MIP images confirms the above findings CTA NECK FINDINGS Aortic arch: There is atherosclerotic calcification of the aortic arch. Proximal subclavian arteries are patent. Normal three-vessel branch pattern. Right carotid system: There is atherosclerotic calcification of the right carotid bifurcation without hemodynamically significant stenosis. Left carotid system: There is noncalcified plaque at the left carotid bifurcation, resulting in stenosis measuring 40%. Vertebral arteries: There is at least moderate narrowing of the right vertebral artery origin. There is also at least moderate narrowing of the left vertebral artery origin. There is moderate narrowing of the distal right V1 segment. There is multifocal atherosclerotic calcification along the course of both V2 segments without hemodynamically significant stenosis. The vertebral system is left dominant. There is a large amount of eccentric calcified plaque within the left V3 segment the causes at least 50% luminal narrowing. There is focal narrowing of the left V4 segment just proximal to the confluence with the basilar artery. Skeleton: No advanced bony spinal canal stenosis. No lytic or blastic osseous lesions. Other neck: Bilateral lens replacements. Near complete opacification of the left maxillary sinus. The patient is intubated. Upper chest: Negative Review of the MIP images confirms the above findings CTA HEAD FINDINGS Anterior circulation: --Intracranial internal carotid arteries: There is calcification of both proximal intracranial internal carotid arteries resulting in approximately 50% stenosis bilaterally. This is slightly worse on the left. --Anterior cerebral arteries: Normal. --Middle cerebral arteries: Normal. --Posterior communicating arteries: Absent bilaterally. Posterior circulation: --Posterior cerebral arteries: Normal. --Superior cerebellar arteries: Normal. --Basilar  artery: Normal. --Anterior inferior cerebellar arteries: Normal. --Posterior inferior cerebellar arteries: Normal. Venous sinuses: As permitted by contrast timing, patent. Anatomic variants: None Delayed phase: No abnormal intracranial enhancement. Review of the MIP images confirms the above findings IMPRESSION: 1. Unchanged size of intraparenchymal hemorrhage within the left frontal lobe, with small amount of associated parietotemporal petechial or subarachnoid blood. No new mass effect or hydrocephalus. Hypoattenuation throughout the left MCA distribution is consistent with expected evolution. 2. No intracranial arterial occlusion. 3. 40% stenosis of the proximal left internal carotid artery secondary to noncalcified atherosclerotic plaque. 4. Bilateral vertebral artery origin narrowing that is at least moderate. 5. Multifocal mild-to-moderate narrowing along the courses of both vertebral arteries. 6. Aortic atherosclerosis. Electronically Signed   By: Ulyses Jarred M.D.   On: 10/03/2016 05:49   Ct Angio Neck W Or Wo Contrast  Result Date:  10/03/2016 CLINICAL DATA:  Left MCA infarct with hemorrhagic transformation. EXAM: CT ANGIOGRAPHY HEAD AND NECK TECHNIQUE: Multidetector CT imaging of the head and neck was performed using the standard protocol during bolus administration of intravenous contrast. Multiplanar CT image reconstructions and MIPs were obtained to evaluate the vascular anatomy. Carotid stenosis measurements (when applicable) are obtained utilizing NASCET criteria, using the distal internal carotid diameter as the denominator. CONTRAST:  50 mL Isovue 370 IV COMPARISON:  Head CT 10/01/2016 FINDINGS: CT HEAD FINDINGS Brain: Focus of intraparenchymal hemorrhage within the inferior left frontal lobe is unchanged in size measuring up to 2.2 cm. Extensive hypoattenuation within the left MCA distribution has increased from the prior study. Suspected small amount of left temporoparietal subarachnoid  blood/petechial hemorrhage is unchanged. No midline shift. Mild mass effect on the left lateral ventricle is unchanged. Again noted is a cavum septum pellucidum et vergae. Review of the MIP images confirms the above findings CTA NECK FINDINGS Aortic arch: There is atherosclerotic calcification of the aortic arch. Proximal subclavian arteries are patent. Normal three-vessel branch pattern. Right carotid system: There is atherosclerotic calcification of the right carotid bifurcation without hemodynamically significant stenosis. Left carotid system: There is noncalcified plaque at the left carotid bifurcation, resulting in stenosis measuring 40%. Vertebral arteries: There is at least moderate narrowing of the right vertebral artery origin. There is also at least moderate narrowing of the left vertebral artery origin. There is moderate narrowing of the distal right V1 segment. There is multifocal atherosclerotic calcification along the course of both V2 segments without hemodynamically significant stenosis. The vertebral system is left dominant. There is a large amount of eccentric calcified plaque within the left V3 segment the causes at least 50% luminal narrowing. There is focal narrowing of the left V4 segment just proximal to the confluence with the basilar artery. Skeleton: No advanced bony spinal canal stenosis. No lytic or blastic osseous lesions. Other neck: Bilateral lens replacements. Near complete opacification of the left maxillary sinus. The patient is intubated. Upper chest: Negative Review of the MIP images confirms the above findings CTA HEAD FINDINGS Anterior circulation: --Intracranial internal carotid arteries: There is calcification of both proximal intracranial internal carotid arteries resulting in approximately 50% stenosis bilaterally. This is slightly worse on the left. --Anterior cerebral arteries: Normal. --Middle cerebral arteries: Normal. --Posterior communicating arteries: Absent  bilaterally. Posterior circulation: --Posterior cerebral arteries: Normal. --Superior cerebellar arteries: Normal. --Basilar artery: Normal. --Anterior inferior cerebellar arteries: Normal. --Posterior inferior cerebellar arteries: Normal. Venous sinuses: As permitted by contrast timing, patent. Anatomic variants: None Delayed phase: No abnormal intracranial enhancement. Review of the MIP images confirms the above findings IMPRESSION: 1. Unchanged size of intraparenchymal hemorrhage within the left frontal lobe, with small amount of associated parietotemporal petechial or subarachnoid blood. No new mass effect or hydrocephalus. Hypoattenuation throughout the left MCA distribution is consistent with expected evolution. 2. No intracranial arterial occlusion. 3. 40% stenosis of the proximal left internal carotid artery secondary to noncalcified atherosclerotic plaque. 4. Bilateral vertebral artery origin narrowing that is at least moderate. 5. Multifocal mild-to-moderate narrowing along the courses of both vertebral arteries. 6. Aortic atherosclerosis. Electronically Signed   By: Ulyses Jarred M.D.   On: 10/03/2016 05:49   Dg Chest Port 1 View  Result Date: 10/03/2016 CLINICAL DATA:  Intubation/vent EXAM: PORTABLE CHEST - 1 VIEW COMPARISON:  10/02/2016 FINDINGS: Endotracheal tube and left PICC remain in place. Nasogastric tube has been advanced at least as far as the stomach, tip not seen. Relatively low lung  volumes with patchy bibasilar atelectasis or infiltrates slightly increased. Heart size upper limits normal for technique. Atheromatous aorta. Blunting of left lateral costophrenic angle suggesting small effusion. Visualized bones unremarkable. IMPRESSION: 1. Slight worsening of bibasilar infiltrates or atelectasis. 2.   nasogastric tube to the stomach. Electronically Signed   By: Lucrezia Europe M.D.   On: 10/03/2016 07:37   Portable Chest Xray  Result Date: 10/02/2016 CLINICAL DATA:  Endotracheal tube  placement EXAM: PORTABLE CHEST 1 VIEW COMPARISON:  12/02/2015 FINDINGS: Cardiac shadow remains enlarged. An endotracheal tube is again noted approximately 2.8 cm above the carina in satisfactory position. The nasogastric catheter has been removed in the interval. A new left-sided PICC line is noted with the tip at cavoatrial junction. The lungs show some patchy atelectatic changes in the bases bilaterally IMPRESSION: Bibasilar atelectasis. Tubes and lines as described. Electronically Signed   By: Inez Catalina M.D.   On: 10/02/2016 15:17   Dg Abd Portable 1v  Result Date: 10/02/2016 CLINICAL DATA:  Feeding tube placement. EXAM: PORTABLE ABDOMEN - 1 VIEW COMPARISON:  04/07/2015 FINDINGS: Nasogastric terminates in the right-sided side of the abdomen, either within the distal stomach or proximal duodenum. Nonobstructive bowel gas pattern. Cardiomegaly. IMPRESSION: Nasogastric terminating at the distal stomach or proximal duodenum. Electronically Signed   By: Abigail Miyamoto M.D.   On: 10/02/2016 16:30   PCXR: basilar atx bilaterally ETT good position   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, aneurysmal dilation of Lt common iliac, atherosclerosis CT head 11/4: . Evolving acute left MCA infarct with hemorrhagic transformation. Mildly increased petechial hemorrhage and slightly increased size of 2.2 cm left frontal lobe parenchymal hematoma. 2. Slightly increased mass effect without midline shift or Hydrocephalus.  CULTURES: Blood 11/03 >> Urine 11/03 >> neg  ANTIBIOTICS: Vancomycin 11/03 >> 11/03 Zosyn 11/03 >> 11/03   SIGNIFICANT EVENTS: 11/03 Admit, neuro consulted 11/4 3% started.  11/5 passed SBT, extubated 11/5 re intubated  LINES/TUBES: ETT 11/03 >> 11/5  DISCUSSION: 75 yo female  former smoker with altered mental status, HTN emergency, Rt sided weakness, compromised airway from Lt MCA infarct.  Also found to have non displaced Rt pubic rami fracture. She is currently awake, agitated but moves all extremities. Started 3% saline on 11/4, MS and exam has improved. Passing SBT. Will cont 3% NS, extubate and watch closely. I suspect she will have extensive expressive and receptive aphasia. Will cont routine stroke care s/p extubation.    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 ->prognosis poor - neurology following - cont 3% for cerebral edema- to NA goals , at 150 now - monitor for pulm edema on 3% -WUA  HTN emergency. - goal SBP < 160  Compromised airway in setting of CVA. Hx of COPD/asthma. ARF, reintubated 11/5 - reintubated rapid, follow neuro examination and wean -wean cpap 5 ps 10, volumes unlikely to be able to reduce -pcxr in am for edema risk on 3%  Pubic rami fracture. - consulted orthopedics-->for ROV 4 weeks after admit - WBAT w/ walker   DM type II. - SSI  Constipation  Nutrition. Hx of GERD.  - esnure Tf retsarted - dulcolax sup  A on C AKI - monitor renal fx, urine outpt -  cont current IVFs- 3%, when off, kvo saline  DVT prophylaxis - SCDs SUP - Protonix Goals of care - Full code  Ccm time 35 min   Lavon Paganini. Titus Mould, MD, [place] Pgr: [facility]

## 2016-10-04 ENCOUNTER — Inpatient Hospital Stay (HOSPITAL_COMMUNITY): Payer: Medicare Other

## 2016-10-04 DIAGNOSIS — I639 Cerebral infarction, unspecified: Secondary | ICD-10-CM

## 2016-10-04 DIAGNOSIS — E785 Hyperlipidemia, unspecified: Secondary | ICD-10-CM

## 2016-10-04 LAB — BASIC METABOLIC PANEL
ANION GAP: 3 — AB (ref 5–15)
BUN: 40 mg/dL — ABNORMAL HIGH (ref 6–20)
CALCIUM: 9.3 mg/dL (ref 8.9–10.3)
CO2: 23 mmol/L (ref 22–32)
Chloride: 125 mmol/L — ABNORMAL HIGH (ref 101–111)
Creatinine, Ser: 1.29 mg/dL — ABNORMAL HIGH (ref 0.44–1.00)
GFR, EST AFRICAN AMERICAN: 45 mL/min — AB (ref >60)
GFR, EST NON AFRICAN AMERICAN: 39 mL/min — AB (ref >60)
Glucose, Bld: 169 mg/dL — ABNORMAL HIGH (ref 65–99)
Potassium: 3.8 mmol/L (ref 3.5–5.1)
Sodium: 151 mmol/L — ABNORMAL HIGH (ref 135–145)

## 2016-10-04 LAB — GLUCOSE, CAPILLARY
GLUCOSE-CAPILLARY: 200 mg/dL — AB (ref 65–99)
GLUCOSE-CAPILLARY: 149 mg/dL — AB (ref 65–99)
Glucose-Capillary: 146 mg/dL — ABNORMAL HIGH (ref 65–99)
Glucose-Capillary: 164 mg/dL — ABNORMAL HIGH (ref 65–99)
Glucose-Capillary: 169 mg/dL — ABNORMAL HIGH (ref 65–99)
Glucose-Capillary: 196 mg/dL — ABNORMAL HIGH (ref 65–99)
Glucose-Capillary: 250 mg/dL — ABNORMAL HIGH (ref 65–99)

## 2016-10-04 LAB — CBC WITH DIFFERENTIAL/PLATELET
BASOS ABS: 0 10*3/uL (ref 0.0–0.1)
BASOS PCT: 0 %
EOS PCT: 1 %
Eosinophils Absolute: 0.1 10*3/uL (ref 0.0–0.7)
HCT: 29 % — ABNORMAL LOW (ref 36.0–46.0)
Hemoglobin: 8.8 g/dL — ABNORMAL LOW (ref 12.0–15.0)
Lymphocytes Relative: 12 %
Lymphs Abs: 0.9 10*3/uL (ref 0.7–4.0)
MCH: 30 pg (ref 26.0–34.0)
MCHC: 30.3 g/dL (ref 30.0–36.0)
MCV: 99 fL (ref 78.0–100.0)
MONO ABS: 0.6 10*3/uL (ref 0.1–1.0)
Monocytes Relative: 9 %
Neutro Abs: 5.6 10*3/uL (ref 1.7–7.7)
Neutrophils Relative %: 78 %
PLATELETS: 134 10*3/uL — AB (ref 150–400)
RBC: 2.93 MIL/uL — ABNORMAL LOW (ref 3.87–5.11)
RDW: 15.6 % — AB (ref 11.5–15.5)
WBC: 7.2 10*3/uL (ref 4.0–10.5)

## 2016-10-04 LAB — CBC
HCT: 30.7 % — ABNORMAL LOW (ref 36.0–46.0)
HEMOGLOBIN: 9.2 g/dL — AB (ref 12.0–15.0)
MCH: 29.7 pg (ref 26.0–34.0)
MCHC: 30 g/dL (ref 30.0–36.0)
MCV: 99 fL (ref 78.0–100.0)
Platelets: 145 10*3/uL — ABNORMAL LOW (ref 150–400)
RBC: 3.1 MIL/uL — AB (ref 3.87–5.11)
RDW: 15.4 % (ref 11.5–15.5)
WBC: 8.9 10*3/uL (ref 4.0–10.5)

## 2016-10-04 LAB — SODIUM
SODIUM: 152 mmol/L — AB (ref 135–145)
SODIUM: 154 mmol/L — AB (ref 135–145)

## 2016-10-04 LAB — TRIGLYCERIDES: Triglycerides: 117 mg/dL (ref <150)

## 2016-10-04 MED ORDER — INSULIN GLARGINE 100 UNIT/ML ~~LOC~~ SOLN
15.0000 [IU] | Freq: Every day | SUBCUTANEOUS | Status: DC
  Administered 2016-10-04 – 2016-10-05 (×2): 15 [IU] via SUBCUTANEOUS
  Filled 2016-10-04 (×2): qty 0.15

## 2016-10-04 MED ORDER — HYDRALAZINE HCL 50 MG PO TABS
50.0000 mg | ORAL_TABLET | Freq: Three times a day (TID) | ORAL | Status: DC
  Administered 2016-10-04 – 2016-10-10 (×18): 50 mg via ORAL
  Filled 2016-10-04 (×19): qty 1

## 2016-10-04 MED ORDER — CARVEDILOL 6.25 MG PO TABS
6.2500 mg | ORAL_TABLET | Freq: Two times a day (BID) | ORAL | Status: DC
  Administered 2016-10-04 – 2016-10-08 (×8): 6.25 mg via ORAL
  Filled 2016-10-04 (×2): qty 2
  Filled 2016-10-04: qty 1
  Filled 2016-10-04 (×2): qty 2
  Filled 2016-10-04 (×3): qty 1
  Filled 2016-10-04 (×2): qty 2

## 2016-10-04 NOTE — Progress Notes (Signed)
**  Preliminary report by tech**  Bilateral lower extremity venous duplex completed. There is no evidence of deep or superficial vein thrombosis involving the right and left lower extremities. All visualized vessels appear patent and compressible. There is no evidence of Baker's cysts bilaterally. Results were given to the patient's nurse, Sarah.  10/04/16 1:45 PM Carlos Levering RVT

## 2016-10-04 NOTE — Progress Notes (Signed)
MEDICATION RELATED NOTE   Pharmacy Re:  Home meds   Medications:  Prescriptions Prior to Admission  Medication Sig Dispense Refill Last Dose  . calcipotriene (DOVONOX) 0.005 % cream Apply 1 application topically 2 (two) times daily.   unknown  . carvedilol (COREG) 3.125 MG tablet Take 1 tablet by mouth 2  times daily with  meals (Patient taking differently: Take 1 tablet (3.125mg ) by mouth 2  times daily with  meals) 180 tablet 3 unknown  . dorzolamide (TRUSOPT) 2 % ophthalmic solution Place 1 drop into the right eye 2 (two) times daily.   unknown  . esomeprazole (NEXIUM) 40 MG capsule Take 40 mg by mouth daily before breakfast.   unknown  . furosemide (LASIX) 40 MG tablet Take 1 tablet (40 mg total) by mouth daily. 30 tablet  unknown  . glimepiride (AMARYL) 2 MG tablet Take 2 mg by mouth daily with breakfast.    unknown  . metFORMIN (GLUCOPHAGE) 500 MG tablet Take 500 mg by mouth daily after supper. Pt. Told that she is  only to take only as needed , for a blood sugar of 250 or greater   unknown  . nystatin (MYCOSTATIN) 100000 UNIT/ML suspension Use as directed 5 mLs in the mouth or throat every 8 (eight) hours.   unknown  . pravastatin (PRAVACHOL) 10 MG tablet Take 10 mg by mouth daily. Monday  and Thursday    unknown  . predniSONE (DELTASONE) 5 MG tablet Take 5 mg by mouth daily.    unknown  . ranitidine (ZANTAC) 150 MG tablet Take 150 mg by mouth daily.   unknown  . Travoprost, BAK Free, (TRAVATAMN) 0.004 % SOLN ophthalmic solution Place 1 drop into both eyes at bedtime.    unknown  . ADVAIR HFA 115-21 MCG/ACT inhaler Inhale 2 puffs into the lungs daily.   Taking  . albuterol (PROVENTIL HFA;VENTOLIN HFA) 108 (90 BASE) MCG/ACT inhaler Inhale 1 puff into the lungs every 6 (six) hours as needed for shortness of breath.    Taking  . ALPRAZolam (XANAX) 0.5 MG tablet Take 0.25 mg by mouth 2 (two) times daily as needed for anxiety or sleep.   Taking  . ammonium lactate (LAC-HYDRIN) 12 % lotion  Apply 1 application topically daily as needed for dry skin (psorias).    Taking  . Artificial Tear Ointment (LUBRICANT EYE OP) Place 1 drop into both eyes as needed (for dry eyes).   Taking  . aspirin 81 MG tablet Take 81 mg by mouth daily.   Taking  . Cholecalciferol (VITAMIN D) 2000 UNITS CAPS Take 2,000 Units by mouth every morning.    Taking  . feeding supplement, GLUCERNA SHAKE, (GLUCERNA SHAKE) LIQD Take 237 mLs by mouth daily at 3 pm.    Taking  . ferrous sulfate 325 (65 FE) MG tablet Take 1 tablet (325 mg total) by mouth 2 (two) times daily with a meal. 60 tablet 2 Taking  . fluticasone (FLONASE) 50 MCG/ACT nasal spray Place 1 spray into both nostrils at bedtime as needed for allergies.    Taking  . ipratropium-albuterol (DUONEB) 0.5-2.5 (3) MG/3ML SOLN Take 3 mLs by nebulization every 4 (four) hours as needed (shortness of breath).   Taking  . Liniments ([employer] ARTHRITIS PAIN RELIEVER EX) Apply 1 application topically 2 (two) times daily as needed (for shoulder pains, knee, back.).    Taking  . Multiple Vitamins-Minerals (ICAPS) CAPS Take 1 capsule by mouth every morning.    Taking  . ONE  TOUCH ULTRA TEST test strip 1 each by Other route See admin instructions. Check blood sugar twice daily   Taking  . simethicone (MYLICON) 80 MG chewable tablet Chew 1 tablet (80 mg total) by mouth 4 (four) times daily as needed for flatulence. 30 tablet 0 Taking    Assessment: Multiple attempts have been made to confirm this patients home medications with the retail stores, mail order and family members.  We are unable to confirm each one of them as still active.   Plan:  I have marked this medication history as complete for now - please let us know if we can assist further once patient is able to discuss.   Resume those medications you feel most appropriate.  Rober Minion Faye 10/04/2016,2:00 PM

## 2016-10-04 NOTE — Progress Notes (Signed)
STROKE TEAM PROGRESS NOTE   SUBJECTIVE (INTERVAL HISTORY) Patient RN is at bedside. She is still intubated but did weaning trial well yesterday. She is awake but aphasia. Right side neglect. On tube feeding. BP still at high side. Right arm moving more than before.    OBJECTIVE Temp:  [98.7 F (37.1 C)-99.5 F (37.5 C)] 98.8 F (37.1 C) (11/07 0800) Pulse Rate:  [62-102] 65 (11/07 1600) Cardiac Rhythm: Normal sinus rhythm (11/07 0800) Resp:  [16-33] 16 (11/07 1600) BP: (140-186)/(54-119) 140/56 (11/07 1600) SpO2:  [99 %-100 %] 99 % (11/07 1600) FiO2 (%):  [40 %] 40 % (11/07 1520) Weight:  [190 lb 11.2 oz (86.5 kg)] 190 lb 11.2 oz (86.5 kg) (11/07 0600)  CBC:   Recent Labs Lab 09/30/16 0940  10/04/16 0600 10/04/16 1045  WBC 14.0*  < > 7.2 8.9  NEUTROABS 12.3*  --  5.6  --   HGB 11.5*  < > 8.8* 9.2*  HCT 35.4*  < > 29.0* 30.7*  MCV 93.2  < > 99.0 99.0  PLT 174  < > 134* 145*  < > = values in this interval not displayed.  Basic Metabolic Panel:   Recent Labs Lab 10/03/16 0351  10/03/16 1641  10/04/16 0600 10/04/16 0947  NA 150*  < > 153*  < > 151* 152*  K 3.9  --   --   --  3.8  --   CL 121*  --   --   --  125*  --   CO2 22  --   --   --  23  --   GLUCOSE 219*  --   --   --  169*  --   BUN 32*  --   --   --  40*  --   CREATININE 1.28*  --   --   --  1.29*  --   CALCIUM 9.0  --   --   --  9.3  --   MG 2.2  --  2.2  --   --   --   PHOS 2.5  --  2.1*  --   --   --   < > = values in this interval not displayed.  Lipid Panel:     Component Value Date/Time   CHOL 201 (H) 10/01/2016 0413   TRIG 117 10/04/2016 0600   HDL 49 10/01/2016 0413   CHOLHDL 4.1 10/01/2016 0413   VLDL 21 10/01/2016 0413   LDLCALC 131 (H) 10/01/2016 0413   HgbA1c:  Lab Results  Component Value Date   HGBA1C 8.1 (H) 10/01/2016   Urine Drug Screen: No results found for: LABOPIA, COCAINSCRNUR, LABBENZ, AMPHETMU, THCU, LABBARB    IMAGING I have personally reviewed the radiological  images below and agree with the radiology interpretations.  Ct Head Wo Contrast 09/30/2016 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 No shift of the midline structures. Atrophy and chronic ischemic change in the white matter bilaterally   Ct Angio Chest/abd/pel For Dissection W And/or Wo Contrast 09/30/2016 1. No aortic aneurysm or dissection.  2. Diverticulosis without evidence diverticulitis.  3. Nondisplaced fracture of the right superior and inferior pubic rami.  4. 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  5.  Aortic Atherosclerosis (ICD10-170.0)  6. Severe coronary artery atherosclerosis.  7.  Lingular airspace disease which may reflect atelectasis versus pneumonia.   Ct Angio Neck and head W Or Wo Contrast 10/03/2016 IMPRESSION: 1. Unchanged size of intraparenchymal hemorrhage within the left frontal lobe, with small amount of associated parietotemporal petechial or subarachnoid blood. No new mass effect or hydrocephalus. Hypoattenuation throughout the left MCA distribution is consistent with expected evolution. 2. No intracranial arterial occlusion. 3. 40% stenosis of the proximal left internal carotid artery secondary to noncalcified atherosclerotic plaque. 4. Bilateral vertebral artery origin narrowing that is at least moderate. 5. Multifocal mild-to-moderate narrowing along the courses of both vertebral arteries. 6. Aortic atherosclerosis.   Dg Chest Port 1 View 10/03/2016 IMPRESSION: 1. Slight worsening of bibasilar infiltrates or atelectasis. 2.   nasogastric tube to the stomach.   TTE - Left ventricle: The cavity size was normal. Systolic function was   mildly to moderately reduced. The estimated ejection fraction was   in the range of 40% to 45%. There is  akinesis of the   mid-apicalanteroseptal, anterior, anterolateral, lateral,   inferolateral, inferior, inferoseptal, and apical myocardium.   Doppler parameters are consistent with abnormal left ventricular   relaxation (grade 1 diastolic dysfunction). - Aortic valve: Trileaflet; mildly thickened, mildly calcified   leaflets. - Mitral valve: There was trivial regurgitation. - Left atrium: The atrium was mildly dilated. - Tricuspid valve: There was mild regurgitation. - Pulmonary arteries: Systolic pressure was moderately increased.   PA peak pressure: 51 mm Hg (S). - Pericardium, extracardiac: A small pericardial effusion was   identified circumferential to the heart. There was no evidence of   hemodynamic compromise. Impressions: - No cardiac source of emboli was indentified.  LE venous doppler There is no evidence of deep or superficial vein thrombosis involving the right and left lower extremities. All visualized vessels appear patent and compressible. There is no evidence of Baker's cysts bilaterally. Results were given to the patient's nurse, Sarah.  MRI brain pending   PHYSICAL EXAM General - Well nourished, well developed, intubated.  Ophthalmologic - Fundi not visualized due to noncooperation.  Cardiovascular - Regular rate and rhythm.  Neuro - awake, eyes open, not able to following commands, but able to pantomime. Blinking to the visual threat on the left but not on the right. May have right neglect. PERRL, able to attend both sides. Facial asymmetry can not be tested due to ET tube. Tongue midline inside mouth. LUE and LLE 4/5, RUE 3+/5 and RLE 3-/5. DTR 1+ and no babinski bilaterally. Sensation, coordination and gait not tested.   ASSESSMENT/PLAN Ms. LINDSEE HOLLOPETER is a 75 y.o. female with history of diabetes mellitus, tobacco use, peripheral vascular disease, coronary artery disease with previous MI, hypertension, hyperlipidemia, history of GI bleed presenting with  right hemiparesis. She did not receive IV t-PA due to Late presentation.  Stroke:  Left MCA large infarct, embolic pattern, likely due to large vessel atherosclerosis given multiple uncontrolled stroke risk factors including DM, HTN, HLD, ED, CAD/MI and left ICA stenosis. Cardioembolic cannot be ruled out.  Resultant  Right hemiparesis and aphasia  MRI  pending  CT - acute left MCA infarct with hemorrhagic transformation.   Repeat CT stable infarct and hemorrhagic transformation  CTA head and neck - left ICA 40% stenosis, no intracranial occlusion. Diffuse atherosclerosis  2D Echo - EF 40-45%  LE venous Doppler no DVT  Recommend 30 day cardiac event monitoring as outpatient to rule out A. fib  LDL - 131  HgbA1c 8.1  VTE prophylaxis - heparin  subcutaneous Diet NPO time specified, on TF  aspirin 81 mg daily prior to admission, now on No antithrombotic - secondary to hemorrhagic transformation. Pt has plavix allergy.   Ongoing aggressive stroke risk factor management  Therapy recommendations: - pending  Disposition: - Pending  Respiratory failure  Not tolerating extubation  Re-intubated  On weaning trials, tolerating well  Wean off vent as able  CCM on board  Hypertension  Stable  Resume home Corag  Add hydralazine 50mg  tid BP goal < 160 due to hemorrhagic transformation  Hyperlipidemia  Home meds:  Pravachol 10 mg twice each week - not resumed in hospital  LDL 131, goal < 70  Statin intolerance   Hold off due to hemorrhagic transformation  Diabetes  HgbA1c 8.1, goal < 7.0  Uncontrolled  SSI  Put on lantus 15 units daily  CBG monitoring  Other Stroke Risk Factors  Advanced age  Former smoker. Quit 3 years ago.  Obesity, Body mass index is 29.87 kg/m., recommend weight loss, diet and exercise as appropriate   CAD/MI  PVD with left foot anterior amputation - Complete occlusion of the left external iliac artery just distal to the  origin with reconstitution  Other Active Problems  Leukocytosis 12.6-->9.8->8.9->7.2  AKI - creatinine 1.63-->1.44->1.28->1.29  Nondisplaced fracture of the right superior and inferior pubic rami.   Hospital day # 4  This patient is critically ill due to left MCA infarct with hemorrhagic transformation, respiratory failure requiring intubation, uncontrolled diabetes, hypertensive urgency and at significant risk of neurological worsening, death form hematoma expansion, recurrent stroke, respiratory failure, heart failure, cerebral edema and brain herniation. This patient's care requires constant monitoring of vital signs, hemodynamics, respiratory and cardiac monitoring, review of multiple databases, neurological assessment, discussion with family, other specialists and medical decision making of high complexity. I spent 40 minutes of neurocritical care time in the care of this patient.  Rosalin Hawking, MD PhD Stroke Neurology 10/04/2016 4:09 PM

## 2016-10-04 NOTE — Progress Notes (Signed)
PULMONARY / CRITICAL CARE MEDICINE   Name: [name] MRN: [id number] DOB: [birth year]    ADMISSION DATE:  09/30/2016  REFERRING MD:  Dr. Cletus Gash, ER  CHIEF COMPLAINT:  Rt sided weakness. Left MCA stroke  SUBJECTIVE:  Resting quietly on full support, propofol restarted 2/2 agitation  VITAL SIGNS: BP (!) 144/119   Pulse 91   Temp 98.8 F (37.1 C) (Axillary)   Resp (!) 23   Ht 5\' 7"  (1.702 m)   Wt 190 lb 11.2 oz (86.5 kg)   SpO2 100%   BMI 29.87 kg/m   HEMODYNAMICS:    VENTILATOR SETTINGS: Vent Mode: PRVC FiO2 (%):  [40 %] 40 % Set Rate:  [16 bmp] 16 bmp Vt Set:  [500 mL] 500 mL PEEP:  [5 cmH20] 5 cmH20 Pressure Support:  [10 cmH20] 10 cmH20 Plateau Pressure:  [19 cmH20-24 cmH20] 24 cmH20  INTAKE / OUTPUT: I/O last 3 completed shifts: In: 2880.6 [I.V.:1550.8; NG/GT:1329.8] Out: 1300 [Urine:1300]  PHYSICAL EXAM General:  Comfortable on vent., NAD Neuro:  RASS -1, moves all ext. Follows some commands per nursing HEENT:  Pupils mid point and reactive; orally intubated Cardiovascular:  s1 s2 Regular, no murmur Lungs:  Scattered ronchi Abdomen:  Soft, decreased BS, non tender, obese Musculoskeletal:  Lt foot TMA, min edema Skin:  Chronic venous stasis changes  LABS:  BMET  Recent Labs Lab 10/02/16 0403  10/03/16 0351  10/03/16 1641 10/03/16 2220 10/04/16 0600  NA 149*  < > 150*  < > 153* 152* 151*  K 3.6  --  3.9  --   --   --  3.8  CL 118*  --  121*  --   --   --  125*  CO2 22  --  22  --   --   --  23  BUN 32*  --  32*  --   --   --  40*  CREATININE 1.44*  --  1.28*  --   --   --  1.29*  GLUCOSE 128*  --  219*  --   --   --  169*  < > = values in this interval not displayed.  Electrolytes  Recent Labs Lab 10/02/16 0403 10/02/16 1624 10/03/16 0351 10/03/16 1641 10/04/16 0600  CALCIUM 8.6*  --  9.0  --  9.3  MG  --  1.8 2.2 2.2  --   PHOS  --  3.1 2.5 2.1*  --     CBC  Recent Labs Lab 10/02/16 0403 10/03/16 0351 10/04/16 0600   WBC 9.8 8.9 7.2  HGB 9.9* 9.7* 8.8*  HCT 31.1* 31.0* 29.0*  PLT 154 162 134*    Coag's  Recent Labs Lab 09/30/16 0940  APTT 24  INR 1.04    Sepsis Markers  Recent Labs Lab 09/30/16 0940  09/30/16 0954 09/30/16 1222 09/30/16 1341  LATICACIDVEN  --   < > 2.05* 1.9 1.92*  PROCALCITON <0.10  --   --   --   --   < > = values in this interval not displayed.  ABG  Recent Labs Lab 09/30/16 1056 10/02/16 0148 10/02/16 1637  PHART 7.320* 7.461* 7.343*  PCO2ART 54.5* 32.4 41.3  PO2ART 427.0* 80.0* 88.6    Liver Enzymes  Recent Labs Lab 09/30/16 0940 10/02/16 0403 10/03/16 0351  AST 30 34 45*  ALT 21 20 30   ALKPHOS 66 46 53  BILITOT 0.7 1.0 0.9  ALBUMIN 3.3* 2.7* 3.0*    Cardiac  Enzymes No results for input(s): TROPONINI, PROBNP in the last 168 hours.  Glucose  Recent Labs Lab 10/03/16 1142 10/03/16 1601 10/03/16 2023 10/04/16 0030 10/04/16 0410 10/04/16 0833  GLUCAP 214* 190* 192* 250* 196* 146*    Imaging No results found. PCXR: basilar atx bilaterally ETT good position   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, aneurysmal dilation of Lt common iliac, atherosclerosis CT head 11/4: . Evolving acute left MCA infarct with hemorrhagic transformation. Mildly increased petechial hemorrhage and slightly increased size of 2.2 cm left frontal lobe parenchymal hematoma. 2. Slightly increased mass effect without midline shift or Hydrocephalus. CT Head 10/03/16: 1. Unchanged size of intraparenchymal hemorrhage within the left frontal lobe, with small amount of associated parietotemporal petechial or subarachnoid blood. No new mass effect or hydrocephalus. Hypoattenuation throughout the left MCA distribution is consistent with expected evolution. 2. No  intracranial arterial occlusion.  MRI Brain 11/7: Pending  CULTURES: Blood 11/03 >> Urine 11/03 >> neg  ANTIBIOTICS: Vancomycin 11/03 >> 11/03 Zosyn 11/03 >> 11/03   SIGNIFICANT EVENTS: 11/03 Admit, neuro consulted 11/4 3% started.  11/5 passed SBT, extubated 11/5 re intubated  LINES/TUBES: ETT 11/03 >> 11/5         11/5>>> DISCUSSION: 75 yo female former smoker with altered mental status, HTN emergency, Rt sided weakness, compromised airway from Lt MCA infarct.  Also found to have non displaced Rt pubic rami fracture. She is currently awake, agitated but moves all extremities. Started 3% saline on 11/4, MS and exam has improved. Passing SBT. Will cont 3% NS, extubate and watch closely. I suspect she will have extensive expressive and receptive aphasia. Will cont routine stroke care s/p extubation.    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 ->prognosis poor, following some commands, agitation off propofol - neurology following -  3% for cerebral edema d/c'd as -  NA goals , at 151 now - monitor for pulm edema on 3% - WUA Daily   HTN emergency. - goal SBP < 160  Compromised airway in setting of CVA. Hx of COPD/asthma. ARF, reintubated 11/5 CXR with stable mild CHF, trace bilateral pleural effusions Elevated chloride  - reintubated rapid, follow neuro examination and wean -wean cpap 5 ps 10, volumes unlikely to be able to reduce -pcxr 11/7 for edema risk on 3% ( Now d/c'd) - saturation goals >90%   Hematology: - Drop in HGB from 8.9 11/6 to 7.2 11/7 - No obvious source of bleeding - Plan - Repeat CBC now to confirm - CBC in 11/8 - If result confirmed as accurate consider d/c heparin - transfuse for HGB <7   - Low Grade Fever/ WBC WNL - Trace Bilateral effusions - P - Trend WBC and fever curve   Pubic rami fracture. - consulted orthopedics-->for  ROV 4 weeks after admit - WBAT w/ walker  - PT/OT as able and appropriate  DM type II. - CBG's Q 4  - SSI  Constipation  Nutrition. Hx of GERD.  - esnure Tf restarted and at goal - dulcolax prn  A on C AKI - monitor renal fx, urine outpt ( Foley d/c'd I&O inaccurate) - cont current IVFs-  kvo saline when appropriate  DVT prophylaxis - SCDs  SUP - Protonix Goals of care - Full code  Magdalen Spatz, AGACNP-BC [place] Pager # [phone] 10/04/2016  STAFF NOTE: Linwood Dibbles, MD FACP have personally reviewed patient's available data, including medical history, events of note, physical examination and test results as part of my evaluation. I have discussed with resident/NP and other care providers such as pharmacist, RN and RRT. In addition, I personally evaluated patient and elicited key findings of:  Awake, alert, FC , waving at me, some aphasia evident, lungs slight coarse, sys is controlled on propofol,  If prop off would need to add htn meds, 3% off, Na remains in range, keep even balance , NO free water, wean today cpap 5 ps5 , goal 30 min , assess rsbi, no extubation until MRI done, would like to have another opportunity for extubation trial soon as neuro has improved?, WUA mandatory, no abx, follow cough , airway protection skills, appears to have them The patient is critically ill with multiple organ systems failure and requires high complexity decision making for assessment and support, frequent evaluation and titration of therapies, application of advanced monitoring technologies and extensive interpretation of multiple databases.   Critical Care Time devoted to patient care services described in this note is 30 Minutes. This time reflects time of care of this signee: Merrie Roof, MD FACP. This critical care time does not reflect procedure time, or teaching time or supervisory time of PA/NP/Med student/Med Resident etc but could  involve care discussion time. Rest per NP/medical resident whose note is outlined above and that I agree with   Lavon Paganini. Titus Mould, MD, [place] Pgr: [facility] 10/04/2016 11:14 AM

## 2016-10-04 NOTE — Care Management Important Message (Signed)
Important Message  Patient Details  Name: [name] MRN: [id number] Date of Birth: 1941-03-04   Medicare Important Message Given:  Other (see comment)    [name], [name] 10/04/2016, 9:18 AM

## 2016-10-04 NOTE — Progress Notes (Signed)
OT Cancellation Note  Patient Details Name: ARRIEL ONTIVEROS MRN: [id number] DOB: 07-24-1941   Cancelled Treatment:    Reason Eval/Treat Not Completed: Patient not medically ready Please order PT if appropriate  Vonita Moss   OTR/L Pager: [phone] Office: [phone] .  10/04/2016, 9:17 AM

## 2016-10-05 ENCOUNTER — Inpatient Hospital Stay (HOSPITAL_COMMUNITY): Payer: Medicare Other

## 2016-10-05 DIAGNOSIS — Z794 Long term (current) use of insulin: Secondary | ICD-10-CM

## 2016-10-05 DIAGNOSIS — I739 Peripheral vascular disease, unspecified: Secondary | ICD-10-CM

## 2016-10-05 LAB — CBC
HCT: 29.7 % — ABNORMAL LOW (ref 36.0–46.0)
HEMOGLOBIN: 8.9 g/dL — AB (ref 12.0–15.0)
MCH: 30 pg (ref 26.0–34.0)
MCHC: 30 g/dL (ref 30.0–36.0)
MCV: 100 fL (ref 78.0–100.0)
Platelets: 142 10*3/uL — ABNORMAL LOW (ref 150–400)
RBC: 2.97 MIL/uL — AB (ref 3.87–5.11)
RDW: 15.3 % (ref 11.5–15.5)
WBC: 11.6 10*3/uL — ABNORMAL HIGH (ref 4.0–10.5)

## 2016-10-05 LAB — SODIUM
SODIUM: 152 mmol/L — AB (ref 135–145)
SODIUM: 151 mmol/L — AB (ref 135–145)
Sodium: 153 mmol/L — ABNORMAL HIGH (ref 135–145)
Sodium: 153 mmol/L — ABNORMAL HIGH (ref 135–145)
Sodium: 151 mmol/L — ABNORMAL HIGH (ref 135–145)

## 2016-10-05 LAB — CULTURE, BLOOD (ROUTINE X 2)
CULTURE: NO GROWTH
CULTURE: NO GROWTH

## 2016-10-05 LAB — GLUCOSE, CAPILLARY
GLUCOSE-CAPILLARY: 217 mg/dL — AB (ref 65–99)
GLUCOSE-CAPILLARY: 211 mg/dL — AB (ref 65–99)
GLUCOSE-CAPILLARY: 184 mg/dL — AB (ref 65–99)
Glucose-Capillary: 186 mg/dL — ABNORMAL HIGH (ref 65–99)
Glucose-Capillary: 172 mg/dL — ABNORMAL HIGH (ref 65–99)

## 2016-10-05 LAB — BASIC METABOLIC PANEL
ANION GAP: 4 — AB (ref 5–15)
BUN: 43 mg/dL — ABNORMAL HIGH (ref 6–20)
CALCIUM: 9.4 mg/dL (ref 8.9–10.3)
CHLORIDE: 124 mmol/L — AB (ref 101–111)
CO2: 24 mmol/L (ref 22–32)
CREATININE: 1.32 mg/dL — AB (ref 0.44–1.00)
GFR calc non Af Amer: 38 mL/min — ABNORMAL LOW (ref >60)
GFR, EST AFRICAN AMERICAN: 44 mL/min — AB (ref >60)
Glucose, Bld: 232 mg/dL — ABNORMAL HIGH (ref 65–99)
Potassium: 4 mmol/L (ref 3.5–5.1)
SODIUM: 152 mmol/L — AB (ref 135–145)

## 2016-10-05 MED ORDER — INSULIN GLARGINE 100 UNIT/ML ~~LOC~~ SOLN
18.0000 [IU] | Freq: Every day | SUBCUTANEOUS | Status: DC
  Administered 2016-10-06 – 2016-10-09 (×4): 18 [IU] via SUBCUTANEOUS
  Filled 2016-10-05 (×5): qty 0.18

## 2016-10-05 MED ORDER — CHLORHEXIDINE GLUCONATE 0.12 % MT SOLN
OROMUCOSAL | Status: AC
  Administered 2016-10-05: 15 mL
  Filled 2016-10-05: qty 15

## 2016-10-05 NOTE — Evaluation (Signed)
Occupational Therapy Evaluation Patient Details Name: [name] MRN: [id number] DOB: 1941-02-27 Today's Date: 10/05/2016    History of Present Illness Patient is a 75 y/o female with hx of DM, PVD, PAD, MI, HTN, gout, COPD, CAD, lower limb ischemia presents with right sided weakness. 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 Found to have Nondisplaced fracture of the right superior and inferior pubic rami. 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    Clinical Impression   PT admitted with L MCA and R pubic rami fx. Pt currently with functional limitiations due to the deficits listed below (see OT problem list).  PTA unknown at this time no family present. Pt tolerated EOB sitting and stable VSS throughout session. Pt with inattention to the R and needed physical (A) for neck rotation/  Pt will benefit from skilled OT to increase their independence and safety with adls and balance to allow discharge cir.     Follow Up Recommendations  CIR    Equipment Recommendations  3 in 1 bedside comode;Wheelchair (measurements OT);Wheelchair cushion (measurements OT);Hospital bed    Recommendations for Other Services Rehab consult     Precautions / Restrictions Precautions Precautions: Fall Precaution Comments: wrist restraints; vent; PEG Restrictions Weight Bearing Restrictions: No      Mobility Bed Mobility Overal bed mobility: Needs Assistance Bed Mobility: Supine to Sit;Sit to Supine     Supine to sit: Max assist;+2 for physical assistance;HOB elevated Sit to supine: Max assist;+2 for physical assistance   General bed mobility comments: Assist with BLEs and to elevate trunk to sitting. Able to reach up to rail with LUE to scoot self up in bed.   Transfers                      Balance Overall balance  assessment: Needs assistance Sitting-balance support: Feet supported;Single extremity supported Sitting balance-Leahy Scale: Fair Sitting balance - Comments: Initially requires support for sitting but able to scoot self to EOB with Min A for balance; Able to sit unsupported towards end of session and initiate abdominals.                                    ADL Overall ADL's : Needs assistance/impaired Eating/Feeding: NPO   Grooming: Wash/dry face;Maximal assistance   Upper Body Bathing: Maximal assistance   Lower Body Bathing: Total assistance                         General ADL Comments: pt eob for > 10 minutes and progressed from max (A) sitting to min guard (A). pt moving bil Le. pt could attempt static standing next session. pt currently full vent support with propofol turned off for session     Vision     Perception     Praxis      Pertinent Vitals/Pain Pain Assessment: Faces Faces Pain Scale: Hurts little more Pain Location: grimance Pain Descriptors / Indicators: Grimacing Pain Intervention(s): Monitored during session;Premedicated before session;Repositioned     Hand Dominance  (unknown at this time )   Extremity/Trunk Assessment Upper Extremity Assessment Upper Extremity Assessment: RUE deficits/detail RUE Deficits / Details: flaccid during session. No active movement noted. Pt positioned for weight bearing during static sitting   Lower  Extremity Assessment Lower Extremity Assessment: Defer to PT evaluation RLE Deficits / Details: Able to spontaneously move RLE into knee extension; withdraws to noxious stimuli. No clonus noted.  RLE Sensation: decreased light touch   Cervical / Trunk Assessment Cervical / Trunk Assessment: Kyphotic   Communication Communication Communication: Other (comment) (intubated)   Cognition Arousal/Alertness: Awake/alert Behavior During Therapy: Flat affect Overall Cognitive Status: Difficult to  assess Area of Impairment: Following commands;Problem solving;Attention   Current Attention Level: Focused   Following Commands: Follows one step commands inconsistently     Problem Solving: Slow processing;Decreased initiation General Comments: Nods yes to all questions asked, appropriate or not. Right neglect noted. Pt following simple command with demonstration and then perseverates on that task. pt holding L LE up for PT but then continues task and needed max cues to terminate   General Comments       Exercises       Shoulder Instructions      Home Living Family/patient expects to be discharged to:: Inpatient rehab                             Home Equipment: Gilford Rile - 2 wheels;Cane - single point;Bedside commode          Prior Functioning/Environment          Comments: Not able to obtain PLOF/history due to aphasia and pt on the ventilator.         OT Problem List: Decreased strength;Decreased activity tolerance;Impaired balance (sitting and/or standing);Impaired vision/perception;Decreased coordination;Decreased cognition;Decreased safety awareness;Decreased knowledge of use of DME or AE;Decreased knowledge of precautions;Cardiopulmonary status limiting activity;Impaired sensation;Obesity;Impaired UE functional use;Increased edema;Decreased range of motion   OT Treatment/Interventions: Self-care/ADL training;Therapeutic exercise;Neuromuscular education;Energy conservation;DME and/or AE instruction;Therapeutic activities;Cognitive remediation/compensation;Visual/perceptual remediation/compensation;Patient/family education;Balance training    OT Goals(Current goals can be found in the care plan section) Acute Rehab OT Goals Patient Stated Goal: pt unable to state OT Goal Formulation: Patient unable to participate in goal setting Potential to Achieve Goals: Good  OT Frequency: Min 2X/week   Barriers to D/C: Other (comment) (unknown)           Co-evaluation PT/OT/SLP Co-Evaluation/Treatment: Yes Reason for Co-Treatment: Complexity of the patient's impairments (multi-system involvement);For patient/therapist safety   OT goals addressed during session: Strengthening/ROM;ADL's and self-care      End of Session Equipment Utilized During Treatment: Oxygen Nurse Communication: Mobility status;Precautions  Activity Tolerance: Patient tolerated treatment well Patient left: in bed;with call bell/phone within reach;with bed alarm set;with SCD's reapplied;with restraints reapplied   Time: [id number] OT Time Calculation (min): 22 min Charges:  OT General Charges $OT Visit: 1 Procedure OT Evaluation $OT Eval Moderate Complexity: 1 Procedure G-Codes:    Parke Poisson B 10/19/2016, 2:55 PM   Jeri Modena   OTR/L PagerIP:[phone] Office: [phone] .

## 2016-10-05 NOTE — Progress Notes (Signed)
eLink Physician-Brief Progress Note Patient Name: ANNABELLE ALLCORN DOB: 05-15-1941 MRN: [id number]   Date of Service  10/05/2016  HPI/Events of Note  Request to renew restraint orders.   eICU Interventions  Restraint orders renewed.      Intervention Category Minor Interventions: Agitation / anxiety - evaluation and management  Zunairah Devers [place] 10/05/2016, 12:38 AM

## 2016-10-05 NOTE — Progress Notes (Signed)
Spoke with several family members for updates today. Talked with Clarene Critchley (patient's sister) who she lives with and told her that MD would call to give her updates and from there on she can update other family members. Jamilett Ferrante, Rande Brunt, RN

## 2016-10-05 NOTE — Progress Notes (Signed)
STROKE TEAM PROGRESS NOTE   SUBJECTIVE (INTERVAL HISTORY) Patient RN is at bedside. She is still intubated and did not do very well this morning for weaning. CCM considering trach now. She is still awake but aphasia. Right side neglect. On tube feeding. Fever this am, 100.68F.    OBJECTIVE Temp:  [99.2 F (37.3 C)-100.8 F (38.2 C)] 99.4 F (37.4 C) (11/08 2000) Pulse Rate:  [58-93] 78 (11/08 2000) Cardiac Rhythm: (P) Normal sinus rhythm (11/08 1945) Resp:  [16-29] 22 (11/08 2000) BP: (124-181)/(51-124) 158/124 (11/08 2000) SpO2:  [98 %-100 %] 100 % (11/08 2000) FiO2 (%):  [40 %] 40 % (11/08 1914) Weight:  [187 lb 2.7 oz (84.9 kg)] 187 lb 2.7 oz (84.9 kg) (11/08 0334)  CBC:   Recent Labs Lab 09/30/16 0940  10/04/16 0600 10/04/16 1045 10/05/16 0540  WBC 14.0*  < > 7.2 8.9 11.6*  NEUTROABS 12.3*  --  5.6  --   --   HGB 11.5*  < > 8.8* 9.2* 8.9*  HCT 35.4*  < > 29.0* 30.7* 29.7*  MCV 93.2  < > 99.0 99.0 100.0  PLT 174  < > 134* 145* 142*  < > = values in this interval not displayed.  Basic Metabolic Panel:   Recent Labs Lab 10/03/16 0351  10/03/16 1641  10/04/16 0600  10/05/16 0050  10/05/16 1000 10/05/16 1648  NA 150*  < > 153*  < > 151*  < > 152*  < > 153* 151*  K 3.9  --   --   --  3.8  --  4.0  --   --   --   CL 121*  --   --   --  125*  --  124*  --   --   --   CO2 22  --   --   --  23  --  24  --   --   --   GLUCOSE 219*  --   --   --  169*  --  232*  --   --   --   BUN 32*  --   --   --  40*  --  43*  --   --   --   CREATININE 1.28*  --   --   --  1.29*  --  1.32*  --   --   --   CALCIUM 9.0  --   --   --  9.3  --  9.4  --   --   --   MG 2.2  --  2.2  --   --   --   --   --   --   --   PHOS 2.5  --  2.1*  --   --   --   --   --   --   --   < > = values in this interval not displayed.  Lipid Panel:     Component Value Date/Time   CHOL 201 (H) 10/01/2016 0413   TRIG 117 10/04/2016 0600   HDL 49 10/01/2016 0413   CHOLHDL 4.1 10/01/2016 0413   VLDL 21  10/01/2016 0413   LDLCALC 131 (H) 10/01/2016 0413   HgbA1c:  Lab Results  Component Value Date   HGBA1C 8.1 (H) 10/01/2016   Urine Drug Screen: No results found for: LABOPIA, COCAINSCRNUR, LABBENZ, AMPHETMU, THCU, LABBARB    IMAGING I have personally reviewed the radiological images below and agree with the  radiology interpretations.  Ct Head Wo Contrast 09/30/2016 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 No shift of the midline structures. Atrophy and chronic ischemic change in the white matter bilaterally   Ct Angio Chest/abd/pel For Dissection W And/or Wo Contrast 09/30/2016 1. No aortic aneurysm or dissection.  2. Diverticulosis without evidence diverticulitis.  3. Nondisplaced fracture of the right superior and inferior pubic rami.  4. 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  5.  Aortic Atherosclerosis (ICD10-170.0)  6. Severe coronary artery atherosclerosis.  7. Lingular airspace disease which may reflect atelectasis versus pneumonia.   Ct Angio Neck and head W Or Wo Contrast 10/03/2016 IMPRESSION: 1. Unchanged size of intraparenchymal hemorrhage within the left frontal lobe, with small amount of associated parietotemporal petechial or subarachnoid blood. No new mass effect or hydrocephalus. Hypoattenuation throughout the left MCA distribution is consistent with expected evolution. 2. No intracranial arterial occlusion. 3. 40% stenosis of the proximal left internal carotid artery secondary to noncalcified atherosclerotic plaque. 4. Bilateral vertebral artery origin narrowing that is at least moderate. 5. Multifocal mild-to-moderate narrowing along the courses of both vertebral arteries. 6. Aortic atherosclerosis.   Dg Chest Port 1 View 10/03/2016 IMPRESSION: 1.  Slight worsening of bibasilar infiltrates or atelectasis. 2.   nasogastric tube to the stomach.   TTE - Left ventricle: The cavity size was normal. Systolic function was   mildly to moderately reduced. The estimated ejection fraction was   in the range of 40% to 45%. There is akinesis of the   mid-apicalanteroseptal, anterior, anterolateral, lateral,   inferolateral, inferior, inferoseptal, and apical myocardium.   Doppler parameters are consistent with abnormal left ventricular   relaxation (grade 1 diastolic dysfunction). - Aortic valve: Trileaflet; mildly thickened, mildly calcified   leaflets. - Mitral valve: There was trivial regurgitation. - Left atrium: The atrium was mildly dilated. - Tricuspid valve: There was mild regurgitation. - Pulmonary arteries: Systolic pressure was moderately increased.   PA peak pressure: 51 mm Hg (S). - Pericardium, extracardiac: A small pericardial effusion was   identified circumferential to the heart. There was no evidence of   hemodynamic compromise. Impressions: - No cardiac source of emboli was indentified.  LE venous doppler There is no evidence of deep or superficial vein thrombosis involving the right and left lower extremities. All visualized vessels appear patent and compressible. There is no evidence of Baker's cysts bilaterally. Results were given to the patient's nurse, Sarah.  MRI brain  Acute infarct involving the large majority of the left MCA territory. Hemorrhagic conversion is stable compared to head CT from yesterday. Stable mass effect from cytotoxic edema; no midline shift.   PHYSICAL EXAM General - Well nourished, well developed, intubated.  Ophthalmologic - Fundi not visualized due to noncooperation.  Cardiovascular - Regular rate and rhythm.  Neuro - awake, eyes open, not able to following commands, but able to pantomime. Blinking to the visual threat on the left but not on the right. May have right neglect. PERRL,  able to attend both sides. Facial asymmetry can not be tested due to ET tube. Tongue midline inside mouth. LUE and LLE 4/5, RUE 3+/5 and RLE 3-/5. DTR 1+ and no babinski bilaterally. Sensation, coordination and gait not tested.   ASSESSMENT/PLAN Ms. COLBY GARANT is a 75 y.o. female with history of diabetes  mellitus, tobacco use, peripheral vascular disease, coronary artery disease with previous MI, hypertension, hyperlipidemia, history of GI bleed presenting with right hemiparesis. She did not receive IV t-PA due to Late presentation.  Stroke:  Left MCA large infarct, embolic pattern, likely due to large vessel atherosclerosis given multiple uncontrolled stroke risk factors including DM, HTN, HLD, PVD, CAD/MI and left ICA stenosis. Cardioembolic cannot be ruled out.  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   Repeat CT stable infarct and hemorrhagic transformation  CTA head and neck - left ICA 40% stenosis, no intracranial occlusion. Diffuse atherosclerosis  2D Echo - EF 40-45%  LE venous Doppler no DVT  Recommend 30 day cardiac event monitoring as outpatient to rule out A. fib  LDL - 131  HgbA1c 8.1  VTE prophylaxis - heparin subcutaneous Diet NPO time specified, on TF  aspirin 81 mg daily prior to admission, now on No antithrombotic - secondary to hemorrhagic transformation. Pt has plavix allergy.   Ongoing aggressive stroke risk factor management  Therapy recommendations: - pending  Disposition: - Pending  Respiratory failure  Not tolerating extubation  Re-intubated  On weaning trials  CCM on board  Considering trach now by CCM  Hypertension  Stable now  Resume home Corag  Add hydralazine 50mg  tid BP goal < 160 due to hemorrhagic transformation  Hyperlipidemia  Home meds:  Pravachol 10 mg twice each week - not resumed in  hospital  LDL 131, goal < 70  Statin intolerance   Hold off due to hemorrhagic transformation  Diabetes  HgbA1c 8.1, goal < 7.0  Uncontrolled  SSI  Now on lantus 18 units daily  CBG monitoring  Other Stroke Risk Factors  Advanced age  Former smoker. Quit 3 years ago.  Obesity, Body mass index is 29.32 kg/m., recommend weight loss, diet and exercise as appropriate   CAD/MI  PVD with left foot anterior amputation - Complete occlusion of the left external iliac artery just distal to the origin with reconstitution  Other Active Problems  Leukocytosis 12.6-->9.8->8.9->7.2->11.6  AKI - creatinine 1.63-->1.44->1.28->1.29->1.32  Nondisplaced fracture of the right superior and inferior pubic rami.   Hospital day # 5  This patient is critically ill due to left MCA infarct with hemorrhagic transformation, respiratory failure requiring intubation, uncontrolled diabetes, hypertensive urgency and at significant risk of neurological worsening, death form hematoma expansion, recurrent stroke, respiratory failure, heart failure, cerebral edema and brain herniation. This patient's care requires constant monitoring of vital signs, hemodynamics, respiratory and cardiac monitoring, review of multiple databases, neurological assessment, discussion with family, other specialists and medical decision making of high complexity. I spent 35 minutes of neurocritical care time in the care of this patient.  Neurology will follow peripherally. Feel free to call with questions. Pt will follow up with Dr. Erlinda Hong at [facility] in about 6-8 weeks.   Rosalin Hawking, MD PhD Stroke Neurology 10/05/2016 9:05 PM

## 2016-10-05 NOTE — Progress Notes (Signed)
Inpatient Rehabilitation  Pt has requested that we prescreen pt. For possible IP Rehab. Have reviewed Dr. Janit Pagan notes from today Note that pt. Is still on the vent and with poor progress.  We will follow pt's medical course and to request IP Rehab consult if it becomes appropriate.     Daniel Admissions Coordinator Cell [phone] Office [phone]

## 2016-10-05 NOTE — Evaluation (Signed)
Physical Therapy Evaluation Patient Details Name: [name] MRN: [id number] DOB: [birth year] Today's Date: 10/05/2016   History of Present Illness  Patient is a 75 y/o female with hx of DM, PVD, PAD, MI, HTN, gout, COPD, CAD, lower limb ischemia presents with right sided weakness. 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 Found to have Nondisplaced fracture of the right superior and inferior pubic rami. 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   Clinical Impression  Patient presents with right hemiparesis, left gaze preference and difficulty following commands s/p above. Able to spontaneously move RLE but no active movement noted in RUE. Able to sit EOB unsupported for a few minutes. Pt nodding yes to all questions asked. Will attempt standing and pregait activities next session as tolerated. Would benefit from CIR to maximize independence and mobility prior to return home. Will follow acutely.     Follow Up Recommendations CIR    Equipment Recommendations  Other (comment) (TBA)    Recommendations for Other Services Rehab consult     Precautions / Restrictions Precautions Precautions: Fall Precaution Comments: wrist restraints; vent; PEG Restrictions Weight Bearing Restrictions: No      Mobility  Bed Mobility Overal bed mobility: Needs Assistance Bed Mobility: Supine to Sit;Sit to Supine     Supine to sit: Max assist;+2 for physical assistance;HOB elevated Sit to supine: Max assist;+2 for physical assistance   General bed mobility comments: Assist with BLEs and to elevate trunk to sitting. Able to reach up to rail with LUE to scoot self up in bed.   Transfers                    Ambulation/Gait                Stairs            Wheelchair Mobility    Modified Rankin (Stroke Patients Only)        Balance Overall balance assessment: Needs assistance Sitting-balance support: Feet supported;Single extremity supported Sitting balance-Leahy Scale: Fair Sitting balance - Comments: Initially requires support for sitting but able to scoot self to EOB with Min A for balance; Able to sit unsupported towards end of session and initiate abdominals.                                     Pertinent Vitals/Pain Pain Assessment: Faces Faces Pain Scale: Hurts little more Pain Location: grimacing Pain Descriptors / Indicators: Grimacing Pain Intervention(s): Monitored during session;Repositioned    Home Living Family/patient expects to be discharged to:: Inpatient rehab               Home Equipment: Gilford Rile - 2 wheels;Cane - single point;Bedside commode      Prior Function           Comments: Not able to obtain PLOF/history due to aphasia and pt on the ventilator.      Hand Dominance        Extremity/Trunk Assessment   Upper Extremity Assessment: Defer to OT evaluation           Lower Extremity Assessment: RLE deficits/detail RLE Deficits / Details: Able to spontaneously move RLE into knee extension; withdraws to noxious stimuli. No clonus noted.        Communication  Cognition Arousal/Alertness: Awake/alert Behavior During Therapy: Flat affect Overall Cognitive Status: Difficult to assess Area of Impairment: Following commands;Problem solving       Following Commands: Follows one step commands inconsistently       General Comments: Nods yes to all questions asked, appropriate or not. Right neglect noted.     General Comments      Exercises     Assessment/Plan    PT Assessment Patient needs continued PT services  PT Problem List Decreased strength;Decreased mobility;Decreased cognition;Decreased activity tolerance;Decreased range of motion;Pain;Decreased balance;Impaired sensation;Cardiopulmonary status limiting activity           PT Treatment Interventions DME instruction;Therapeutic activities;Gait training;Therapeutic exercise;Patient/family education;Balance training;Functional mobility training;Neuromuscular re-education;Cognitive remediation    PT Goals (Current goals can be found in the Care Plan section)  Acute Rehab PT Goals Patient Stated Goal: pt unable to state PT Goal Formulation: Patient unable to participate in goal setting Time For Goal Achievement: 10/19/16 Potential to Achieve Goals: Fair    Frequency Min 4X/week   Barriers to discharge   not sure    Co-evaluation PT/OT/SLP Co-Evaluation/Treatment: Yes Reason for Co-Treatment: Complexity of the patient's impairments (multi-system involvement);For patient/therapist safety PT goals addressed during session: Mobility/safety with mobility;Strengthening/ROM         End of Session Equipment Utilized During Treatment: Other (comment) (vent) Activity Tolerance: Patient tolerated treatment well Patient left: in bed;with call bell/phone within reach;with bed alarm set;with SCD's reapplied Nurse Communication: Mobility status         Time: [id number] PT Time Calculation (min) (ACUTE ONLY): 22 min   Charges:   PT Evaluation $PT Eval High Complexity: 1 Procedure     PT G Codes:        Anthany Thornhill A Parish Dubose 10/05/2016, 1:29 PM Wray Kearns, [place], DPT [phone]

## 2016-10-05 NOTE — Progress Notes (Signed)
PULMONARY / CRITICAL CARE MEDICINE   Name: [name] MRN: [id number] DOB: 17-May-1941    ADMISSION DATE:  09/30/2016  REFERRING MD:  Dr. Cletus Gash, ER  CHIEF COMPLAINT:  Rt sided weakness. Left MCA stroke  SUBJECTIVE:  Resting quietly on full support, propofol gtt. Agitated on WUA.  VITAL SIGNS: BP (!) 162/69   Pulse 74   Temp (!) 100.8 F (38.2 C) (Axillary)   Resp 19   Ht 5\' 7"  (1.702 m)   Wt 187 lb 2.7 oz (84.9 kg)   SpO2 100%   BMI 29.32 kg/m   HEMODYNAMICS:    VENTILATOR SETTINGS: Vent Mode: PRVC FiO2 (%):  [40 %] 40 % Set Rate:  [16 bmp] 16 bmp Vt Set:  [500 mL] 500 mL PEEP:  [5 cmH20] 5 cmH20 Pressure Support:  [5 cmH20-10 cmH20] 10 cmH20 Plateau Pressure:  [19 cmH20-22 cmH20] 21 cmH20  INTAKE / OUTPUT: I/O last 3 completed shifts: In: 3454.6 [I.V.:1881.1; NG/GT:1573.5] Out: -   Intake/Output Summary (Last 24 hours) at 10/05/16 1124 Last data filed at 10/05/16 1000  Gross per 24 hour  Intake           2127.8 ml  Output                0 ml  Net           2127.8 ml   PHYSICAL EXAM General:  Comfortable on vent., NAD when sedated. Neuro:  RASS -1, moves all ext.  Nods head regardless of request and did not follow commands. Rt side neglect  HEENT:  Pupils mid point and reactive; orally intubated.  Disconjugate gaze Cardiovascular:  s1 s2 Regular, no murmur Lungs:  Scattered ronchi, diminished in the bases Abdomen:  Soft, decreased BS, non tender, obese Musculoskeletal:  Lt foot TMA, min edema Skin:  Chronic venous stasis changes  LABS:  BMET  Recent Labs Lab 10/03/16 0351  10/04/16 0600  10/04/16 2220 10/05/16 0050 10/05/16 0540  NA 150*  < > 151*  < > 153* 152* 152*  K 3.9  --  3.8  --   --  4.0  --   CL 121*  --  125*  --   --  124*  --   CO2 22  --  23  --   --  24  --   BUN 32*  --  40*  --   --  43*  --   CREATININE 1.28*  --  1.29*  --   --  1.32*  --   GLUCOSE 219*  --  169*  --   --  232*  --   < > = values in this interval  not displayed.  Electrolytes  Recent Labs Lab 10/02/16 1624 10/03/16 0351 10/03/16 1641 10/04/16 0600 10/05/16 0050  CALCIUM  --  9.0  --  9.3 9.4  MG 1.8 2.2 2.2  --   --   PHOS 3.1 2.5 2.1*  --   --     CBC  Recent Labs Lab 10/04/16 0600 10/04/16 1045 10/05/16 0540  WBC 7.2 8.9 11.6*  HGB 8.8* 9.2* 8.9*  HCT 29.0* 30.7* 29.7*  PLT 134* 145* 142*    Coag's  Recent Labs Lab 09/30/16 0940  APTT 24  INR 1.04    Sepsis Markers  Recent Labs Lab 09/30/16 0940  09/30/16 0954 09/30/16 1222 09/30/16 1341  LATICACIDVEN  --   < > 2.05* 1.9 1.92*  PROCALCITON <0.10  --   --   --   --   < > =  values in this interval not displayed.  ABG  Recent Labs Lab 09/30/16 1056 10/02/16 0148 10/02/16 1637  PHART 7.320* 7.461* 7.343*  PCO2ART 54.5* 32.4 41.3  PO2ART 427.0* 80.0* 88.6    Liver Enzymes  Recent Labs Lab 09/30/16 0940 10/02/16 0403 10/03/16 0351  AST 30 34 45*  ALT 21 20 30   ALKPHOS 66 46 53  BILITOT 0.7 1.0 0.9  ALBUMIN 3.3* 2.7* 3.0*    Cardiac Enzymes No results for input(s): TROPONINI, PROBNP in the last 168 hours.  Glucose  Recent Labs Lab 10/04/16 1225 10/04/16 1621 10/04/16 2018 10/04/16 2336 10/05/16 0401 10/05/16 0840  GLUCAP 200* 149* 164* 169* 217* 75*    Imaging Mr Brain Wo Contrast  Result Date: 10/04/2016 CLINICAL DATA:  Right-sided weakness.  Hemorrhagic infarct. EXAM: MRI HEAD WITHOUT CONTRAST TECHNIQUE: Multiplanar, multiecho pulse sequences of the brain and surrounding structures were obtained without intravenous contrast. COMPARISON:  Head CT and CTA from yesterday. FINDINGS: Brain: Restricted diffusion throughout the large majority of the left MCA territory with superimposed petechial hemorrhage and anterior frontal parenchymal hematoma measuring 14 mm. Background of moderate chronic microvascular disease in the cerebral white matter. Expected cytotoxic edema causes effacement of left lateral ventricle. No midline  shift or herniation. Vascular: Preserved major flow voids. Skull and upper cervical spine: Normal marrow signal. Sinuses/Orbits: Chronic maxillary sinusitis with atelectasis and wall sclerosis on the left. IMPRESSION: Acute infarct involving the large majority of the left MCA territory. Hemorrhagic conversion is stable compared to head CT from yesterday. Stable mass effect from cytotoxic edema; no midline shift. Electronically Signed   By: Monte Fantasia M.D.   On: 10/04/2016 15:10   Dg Chest Port 1 View  Result Date: 10/05/2016 CLINICAL DATA:  Respiratory failure. EXAM: PORTABLE CHEST 1 VIEW COMPARISON:  10/04/2016 FINDINGS: Endotracheal tube is 2.4 cm above the carina. PICC line tip in the SVC region. Heart remains enlarged. Patchy densities at both lung bases. Negative for pneumothorax. Again noted is slight enlargement of central vascular structures. IMPRESSION: Endotracheal tube is appropriately positioned. Central vascular congestion with patchy basilar chest densities. Basilar chest densities may represent a combination of atelectasis and small effusions. Electronically Signed   By: Markus Daft M.D.   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 Evolving acute left MCA infarct with hemorrhagic transformation. Mildly increased petechial hemorrhage and slightly increased size of 2.2 cm left frontal lobe parenchymal hematoma. 2. Slightly increased mass effect without midline shift or Hydrocephalus. CT Head 10/03/16: 1. Unchanged size of intraparenchymal hemorrhage within the left frontal lobe, with small amount of associated parietotemporal petechial or  subarachnoid blood. No new mass effect or hydrocephalus. Hypoattenuation throughout the left MCA distribution is consistent with expected evolution. 2. No intracranial arterial occlusion.  MRI Brain 11/7: Acute infarct involving the large majority of the left MCA territory. Hemorrhagic conversion is stable compared to head CT from yesterday. Stable mass effect from cytotoxic edema; no midline shift.  CULTURES: Blood 11/03 >> pending Urine 11/03 >> neg  ANTIBIOTICS: Vancomycin 11/03 >> 11/03 Zosyn 11/03 >> 11/03   SIGNIFICANT EVENTS: 11/03 Admit, neuro consulted 11/4 3% started.  11/5 passed SBT, extubated 11/5 re intubated  LINES/TUBES: ETT 11/03 >> 11/5         11/5>>> DISCUSSION: 75 yo female former smoker with altered mental status, HTN emergency, Rt sided weakness, compromised airway from Lt MCA infarct.  Also found to have non displaced Rt pubic rami fracture.   Pt follows commands per nursing, however on exam she would only nod yes to all questions.  She moved all extremities spontaneously. She is still having some paradoxical breathing though it has improved per nursing.  Plan is to attempt extubation but suspect she will fail and need trach in future.  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 ->prognosis poor, following some commands, agitation off propofol - neurology following - WUA Daily - monitor for Pulm edema  HTN emergency. - goal SBP < 160  Compromised airway in setting of CVA. Hx of COPD/asthma. ARF, reintubated 11/5 CXR with stable mild CHF, trace bilateral pleural effusions Elevated chloride - P - wean propofol, plan to extubate as tolerated but suspect she will need trach based on neuro injury and body habitus. -Consider precedex trial. - saturation goals >90%   Hematology:  Recent Labs  10/04/16 1045 10/05/16 0540  HGB 9.2* 8.9*     - Anemia - No obvious source of bleeding - Plan - trend CBC  - transfuse for HGB <7   - Low Grade Fever/ WBC WNL - Trace Bilateral effusions - P - Trend WBC and fever curve -No Abx for now   Pubic rami fracture. - consulted orthopedics-->for ROV 4 weeks after admit - WBAT w/ walker  - PT/OT as able and appropriate  DM type II. CBG (last 3)   Recent Labs  10/04/16 2336 10/05/16 0401 10/05/16 0840  GLUCAP 169* 217* 211*      - CBG's Q 4  - SSI  Constipation  Nutrition. Hx of GERD.  - continue Tf at goal - dulcolax prn   Lab Results  Component Value Date   CREATININE 1.32 (H) 10/05/2016   CREATININE 1.29 (H) 10/04/2016   CREATININE 1.28 (H) 10/03/2016   CREATININE 1.11 (H) 09/16/2015   CREATININE 1.2 (H) 06/05/2015   CREATININE 1.2 (H) 05/22/2015   CREATININE 1.2 (H) 05/08/2015   A on C AKI - monitor renal fx, urine outpt ( Foley d/c'd I&O inaccurate) - cont current IVFs-  kvo saline when appropriate  DVT prophylaxis - SCDs SUP - Protonix Goals of care - Full code   [facility] Minor ACNP Maryanna Shape PCCM Pager [phone] till 3 pm If no answer page [phone] 10/05/2016, 11:40 AM  STAFF NOTE: I, Merrie Roof, MD FACP have personally reviewed patient's available data, including medical history, events of note, physical examination and test results as part of my evaluation. I have discussed with resident/NP and other care providers such as pharmacist, RN and RRT. In addition, I personally evaluated patient and elicited key findings of: awakens, FC minimal, hold s arm and legs up, weak rue about unchanged, coarse BS, MRi reviewed extensive MCA with unchanged bleed, she weans but has moderate dyschrony paradoxical breathing, repeat wean this afternoon and hope that Na will improve neuro/ resp status, igf not extubated by thur will consider trach Friday, , chem in am , allow NA to be 150-154, keep same MV as on vent, I will call sister teresa today to update,  continued feeds, no free water The patient is critically ill with multiple organ systems  failure and requires high complexity decision making for assessment and support, frequent evaluation and titration of therapies, application of advanced monitoring technologies and extensive interpretation of multiple databases.   Critical Care Time devoted to patient care services described in this note is 35 Minutes. This time reflects time of care of this signee: Merrie Roof, MD FACP. This critical care time does not reflect procedure time, or teaching time or supervisory time of PA/NP/Med student/Med Resident etc but could involve care discussion time. Rest per NP/medical resident whose note is outlined above and that I agree with   Lavon Paganini. Titus Mould, MD, [place] Pgr: [facility] 10/05/2016 2:35 PM

## 2016-10-05 NOTE — Progress Notes (Signed)
Inpatient Diabetes Program Recommendations  AACE/ADA: New Consensus Statement on Inpatient Glycemic Control (2015)  Target Ranges:  Prepandial:   less than 140 mg/dL      Peak postprandial:   less than 180 mg/dL (1-2 hours)      Critically ill patients:  140 - 180 mg/dL   Lab Results  Component Value Date   GLUCAP 211 (H) 10/05/2016   HGBA1C 8.1 (H) 10/01/2016   Results for MYRLE, BAHRI (MRN [id number]) as of 10/05/2016 09:15  Ref. Range 10/04/2016 23:36 10/05/2016 04:01 10/05/2016 08:40  Glucose-Capillary Latest Ref Range: 65 - 99 mg/dL 169 (H) 217 (H) 211 (H)   Review of Glycemic Control  Blood sugars > goal. Would benefit from ICU GLycemic Control Protocol.  Inpatient Diabetes Program Recommendations:    Consider ICU Glycemic Control Protocol.  Thank you. Lorenda Peck, RD, LDN, CDE Inpatient Diabetes Coordinator [phone]

## 2016-10-06 ENCOUNTER — Inpatient Hospital Stay (HOSPITAL_COMMUNITY): Payer: Medicare Other

## 2016-10-06 LAB — PROTIME-INR
INR: 1.11
Prothrombin Time: 14.3 seconds (ref 11.4–15.2)

## 2016-10-06 LAB — CBC
HCT: 29 % — ABNORMAL LOW (ref 36.0–46.0)
Hemoglobin: 8.7 g/dL — ABNORMAL LOW (ref 12.0–15.0)
MCH: 30.2 pg (ref 26.0–34.0)
MCHC: 30 g/dL (ref 30.0–36.0)
MCV: 100.7 fL — ABNORMAL HIGH (ref 78.0–100.0)
PLATELETS: 136 10*3/uL — AB (ref 150–400)
RBC: 2.88 MIL/uL — ABNORMAL LOW (ref 3.87–5.11)
RDW: 15.4 % (ref 11.5–15.5)
WBC: 9.1 10*3/uL (ref 4.0–10.5)

## 2016-10-06 LAB — BASIC METABOLIC PANEL
Anion gap: 7 (ref 5–15)
BUN: 52 mg/dL — AB (ref 6–20)
CALCIUM: 9.8 mg/dL (ref 8.9–10.3)
CO2: 26 mmol/L (ref 22–32)
CREATININE: 1.41 mg/dL — AB (ref 0.44–1.00)
Chloride: 120 mmol/L — ABNORMAL HIGH (ref 101–111)
GFR calc Af Amer: 41 mL/min — ABNORMAL LOW (ref >60)
GFR, EST NON AFRICAN AMERICAN: 35 mL/min — AB (ref >60)
Glucose, Bld: 227 mg/dL — ABNORMAL HIGH (ref 65–99)
POTASSIUM: 4 mmol/L (ref 3.5–5.1)
SODIUM: 153 mmol/L — AB (ref 135–145)

## 2016-10-06 LAB — SODIUM
SODIUM: 151 mmol/L — AB (ref 135–145)
SODIUM: 151 mmol/L — AB (ref 135–145)

## 2016-10-06 LAB — APTT: aPTT: 33 seconds (ref 24–36)

## 2016-10-06 LAB — GLUCOSE, CAPILLARY
GLUCOSE-CAPILLARY: 149 mg/dL — AB (ref 65–99)
GLUCOSE-CAPILLARY: 167 mg/dL — AB (ref 65–99)
GLUCOSE-CAPILLARY: 187 mg/dL — AB (ref 65–99)
GLUCOSE-CAPILLARY: 190 mg/dL — AB (ref 65–99)
Glucose-Capillary: 141 mg/dL — ABNORMAL HIGH (ref 65–99)
Glucose-Capillary: 202 mg/dL — ABNORMAL HIGH (ref 65–99)
Glucose-Capillary: 208 mg/dL — ABNORMAL HIGH (ref 65–99)

## 2016-10-06 LAB — MAGNESIUM: MAGNESIUM: 2.4 mg/dL (ref 1.7–2.4)

## 2016-10-06 LAB — PHOSPHORUS: Phosphorus: 2.9 mg/dL (ref 2.5–4.6)

## 2016-10-06 MED ORDER — FENTANYL CITRATE (PF) 100 MCG/2ML IJ SOLN
200.0000 ug | Freq: Once | INTRAMUSCULAR | Status: AC
  Administered 2016-10-07: 200 ug via INTRAVENOUS
  Filled 2016-10-06: qty 4

## 2016-10-06 MED ORDER — MIDAZOLAM HCL 2 MG/2ML IJ SOLN
4.0000 mg | Freq: Once | INTRAMUSCULAR | Status: AC
  Administered 2016-10-07: 4 mg via INTRAVENOUS
  Filled 2016-10-06: qty 4

## 2016-10-06 MED ORDER — BISACODYL 10 MG RE SUPP
10.0000 mg | Freq: Once | RECTAL | Status: AC
  Administered 2016-10-06: 10 mg via RECTAL
  Filled 2016-10-06: qty 1

## 2016-10-06 MED ORDER — VECURONIUM BROMIDE 10 MG IV SOLR
10.0000 mg | Freq: Once | INTRAVENOUS | Status: AC
  Administered 2016-10-07: 6 mg via INTRAVENOUS

## 2016-10-06 MED ORDER — ETOMIDATE 2 MG/ML IV SOLN: 40.0000 mg | Freq: Once | INTRAVENOUS | Status: DC

## 2016-10-06 MED ORDER — PROPOFOL 500 MG/50ML IV EMUL
5.0000 ug/kg/min | Freq: Once | INTRAVENOUS | Status: DC
Start: 2016-10-06 — End: 2016-10-08

## 2016-10-06 NOTE — Progress Notes (Signed)
PULMONARY / CRITICAL CARE MEDICINE   Name: [name] MRN: [id number] DOB: July 19, 1941    ADMISSION DATE:  09/30/2016  REFERRING MD:  Dr. Cletus Gash, ER  CHIEF COMPLAINT:  Rt sided weakness. Left MCA stroke  SUBJECTIVE:  Some temp 101 Weaned with dyschrony  VITAL SIGNS: BP (!) 136/57   Pulse 61   Temp 98.3 F (36.8 C) (Oral)   Resp 16   Ht 5\' 7"  (1.702 m)   Wt 85.5 kg (188 lb 7.9 oz)   SpO2 100%   BMI 29.52 kg/m   HEMODYNAMICS:    VENTILATOR SETTINGS: Vent Mode: PSV;CPAP FiO2 (%):  [40 %] 40 % Set Rate:  [16 bmp] 16 bmp Vt Set:  [500 mL] 500 mL PEEP:  [5 cmH20] 5 cmH20 Pressure Support:  [5 cmH20-12 cmH20] 12 cmH20 Plateau Pressure:  [20 cmH20-22 cmH20] 22 cmH20  INTAKE / OUTPUT: I/O last 3 completed shifts: In: 3448.2 [I.V.:1828.2; NG/GT:1620] Out: -   Intake/Output Summary (Last 24 hours) at 10/06/16 0833 Last data filed at 10/06/16 0700  Gross per 24 hour  Intake          2294.93 ml  Output                0 ml  Net          2294.93 ml   PHYSICAL EXAM General:  Comfortable on vent Neuro:  RASS -1, moves all ext.  Nods head regardless, no movement rt soes WD pain  HEENT:  Pupils mid point and reactive; orally intubated Cardiovascular:  s1 s2 Regular, no murmur Lungs: clear Abdomen:  Soft, decreased BS, non tender, obese Musculoskeletal:  Lt foot TMA, min edema Skin:  Chronic venous stasis changes  LABS:  BMET  Recent Labs Lab 10/04/16 0600  10/05/16 0050  10/05/16 1648 10/05/16 2300 10/06/16 0430  NA 151*  < > 152*  < > 151* 151* 153*  K 3.8  --  4.0  --   --   --  4.0  CL 125*  --  124*  --   --   --  120*  CO2 23  --  24  --   --   --  26  BUN 40*  --  43*  --   --   --  52*  CREATININE 1.29*  --  1.32*  --   --   --  1.41*  GLUCOSE 169*  --  232*  --   --   --  227*  < > = values in this interval not displayed.  Electrolytes  Recent Labs Lab 10/03/16 0351 10/03/16 1641 10/04/16 0600 10/05/16 0050 10/06/16 0430  CALCIUM 9.0   --  9.3 9.4 9.8  MG 2.2 2.2  --   --  2.4  PHOS 2.5 2.1*  --   --  2.9    CBC  Recent Labs Lab 10/04/16 1045 10/05/16 0540 10/06/16 0430  WBC 8.9 11.6* 9.1  HGB 9.2* 8.9* 8.7*  HCT 30.7* 29.7* 29.0*  PLT 145* 142* 136*    Coag's  Recent Labs Lab 09/30/16 0940  APTT 24  INR 1.04    Sepsis Markers  Recent Labs Lab 09/30/16 0940  09/30/16 0954 09/30/16 1222 09/30/16 1341  LATICACIDVEN  --   < > 2.05* 1.9 1.92*  PROCALCITON <0.10  --   --   --   --   < > = values in this interval not displayed.  ABG  Recent Labs Lab 09/30/16  1056 10/02/16 0148 10/02/16 1637  PHART 7.320* 7.461* 7.343*  PCO2ART 54.5* 32.4 41.3  PO2ART 427.0* 80.0* 88.6    Liver Enzymes  Recent Labs Lab 09/30/16 0940 10/02/16 0403 10/03/16 0351  AST 30 34 45*  ALT 21 20 30   ALKPHOS 66 46 53  BILITOT 0.7 1.0 0.9  ALBUMIN 3.3* 2.7* 3.0*    Cardiac Enzymes No results for input(s): TROPONINI, PROBNP in the last 168 hours.  Glucose  Recent Labs Lab 10/05/16 0840 10/05/16 1215 10/05/16 1653 10/05/16 1955 10/06/16 0025 10/06/16 0411  GLUCAP 211* 186* 172* 184* 208* 202*    Imaging Dg Chest Port 1 View  Result Date: 10/06/2016 CLINICAL DATA:  Respiratory failure.  COPD. EXAM: PORTABLE CHEST 1 VIEW COMPARISON:  10/05/2016 FINDINGS: Endotracheal tube remains in place with tip approximately 2 cm above the carina. Enteric tube courses into the left upper abdomen with tip not imaged. The cardiac silhouette remains enlarged. Aortic atherosclerosis is noted. Lung volumes are minimally increased compared yesterday. Patchy bibasilar opacities have not significantly changed. No large pleural effusion or pneumothorax is identified. Mild central pulmonary vascular congestion is unchanged. IMPRESSION: 1. No significant interval change. Cardiomegaly, pulmonary vascular congestion, and patchy bibasilar opacities which may reflect atelectasis. 2. Aortic atherosclerosis. Electronically Signed    By: Logan Bores M.D.   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 Evolving acute left MCA infarct with hemorrhagic transformation. Mildly increased petechial hemorrhage and slightly increased size of 2.2 cm left frontal lobe parenchymal hematoma. 2. Slightly increased mass effect without midline shift or Hydrocephalus. CT Head 10/03/16: 1. Unchanged size of intraparenchymal hemorrhage within the left frontal lobe, with small amount of associated parietotemporal petechial or subarachnoid blood. No new mass effect or hydrocephalus. Hypoattenuation throughout the left MCA distribution is consistent with expected evolution. 2. No intracranial arterial occlusion.  MRI Brain 11/7: Acute infarct involving the large majority of the left MCA territory. Hemorrhagic conversion is stable compared to head CT from yesterday. Stable mass effect from cytotoxic edema; no midline shift.  CULTURES: Blood 11/03 >> neg Urine 11/03 >> neg  ANTIBIOTICS: Vancomycin 11/03 >> 11/03 Zosyn 11/03 >> 11/03   SIGNIFICANT EVENTS: 11/03 Admit, neuro consulted 11/4 3% started.  11/5 passed SBT, extubated 11/5 re intubated  LINES/TUBES: ETT 11/03 >> 11/5         11/5>>> DISCUSSION: 75 yo female former smoker with altered mental status, HTN emergency, Rt sided weakness, compromised airway from Lt MCA infarct.  Also found to have non displaced Rt pubic rami fracture.   Pt follows commands per nursing, however on exam she would only nod yes to all questions.  She moved all extremities spontaneously. She is still having some paradoxical breathing though it has improved per  nursing.  Plan is to attempt extubation but suspect she will fail and need trach in future.  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 ->prognosis poor, following some commands, agitation off propofol - neurology following - WUA Daily - short acting sedation  HTN emergency. - goal SBP < 160 Controlled overall  Compromised airway in  setting of CVA. Hx of COPD/asthma. ARF, reintubated 11/5 - P -PS 12 required, again is paradoxical, wean as able Ps 8 goal I feel she would need trach to progress Even balance goals pcxr in am  I will call sister   Hematology:  Recent Labs  10/05/16 0540 10/06/16 0430  HGB 8.9* 8.7*    - Anemia - No obvious source of bleeding - Plan - trend CBC  - transfuse for HGB <7  coags okay for trach  - Low Grade Fever/ WBC WNL - Trace Bilateral effusions Fever 101 neuro? - P - Trend WBC and fever curve -No Abx for now -repeat UA BC if spike again  Pubic rami fracture. - consulted orthopedics-->for ROV 4 weeks after admit - WBAT w/ walker  - PT/OT as able and appropriate  DM type II. CBG (last 3)   Recent Labs  10/05/16 1955 10/06/16 0025 10/06/16 0411  GLUCAP 184* 208* 202*    - CBG's Q 4  - SSI  Constipation  Nutrition. Hx of GERD.  - continue Tf at goal - dulcolax prn BM assessment   Lab Results  Component Value Date   CREATININE 1.41 (H) 10/06/2016   CREATININE 1.32 (H) 10/05/2016   CREATININE 1.29 (H) 10/04/2016   CREATININE 1.11 (H) 09/16/2015   CREATININE 1.2 (H) 06/05/2015   CREATININE 1.2 (H) 05/22/2015   CREATININE 1.2 (H) 05/08/2015   A on C AKI - no free water] bmet in am for NA Allow na 150-154  DVT prophylaxis - SCDs SUP - Protonix Goals of care - Full code  Ccm time 35 min   Lavon Paganini. Titus Mould, MD, [place] Pgr: [facility] 10/06/2016 8:33  AM

## 2016-10-06 NOTE — Care Management Note (Signed)
Case Management Note  Patient Details  Name: [name] MRN: [id number] Date of Birth: [birth year]  Subjective/Objective:  Pt admitted on 09/30/16 s/p Lt MCA infarct.  PTA, pt independent, lives with family members.                    Action/Plan: Pt remains intubated currently.  Planning possible tracheostomy on Friday, 11/10.  Will follow progress.    Expected Discharge Date:                  Expected Discharge Plan:  [place]  In-House Referral:     Discharge planning Services  CM Consult  Post Acute Care Choice:    Choice offered to:     DME Arranged:    DME Agency:     HH Arranged:    [place] Agency:     Status of Service:  In process, will continue to follow  If discussed at Long Length of Stay Meetings, dates discussed:    Additional Comments:  Reinaldo Raddle, RN, BSN  Trauma/Neuro ICU Case Manager [phone]

## 2016-10-06 NOTE — Progress Notes (Signed)
Physical Therapy Treatment Patient Details Name: [name] MRN: [id number] DOB: [birth year] Today's Date: 10/06/2016    History of Present Illness Patient is a 75 y/o female with hx of DM, PVD, PAD, MI, HTN, gout, COPD, CAD, lower limb ischemia presents with right sided weakness. 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 Found to have Nondisplaced fracture of the right superior and inferior pubic rami. 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     PT Comments    Patient more restless today. Able to sit unsupported at EOB with 1 UE support. Following simple commands with demonstration and increased time/cues today. Still not attending to right side but moving RLE. Tolerated standing with assist of 2. Continues to nod yes to most questions asked. Will continue to follow.   Follow Up Recommendations  CIR     Equipment Recommendations  Other (comment)    Recommendations for Other Services Rehab consult     Precautions / Restrictions Precautions Precautions: Fall Precaution Comments: wrist restraints; vent; PEG Restrictions Weight Bearing Restrictions: No    Mobility  Bed Mobility Overal bed mobility: Needs Assistance Bed Mobility: Rolling;Supine to Sit;Sit to Supine Rolling: Mod assist   Supine to sit: Max assist;HOB elevated;+2 for safety/equipment Sit to supine: Max assist;+2 for safety/equipment;HOB elevated   General bed mobility comments: Assist with BLEs and to elevate trunk to sitting. Able to assist with rolling to the left using LUE to pull on rail.  Transfers Overall transfer level: Needs assistance Equipment used: 2 person hand held assist Transfers: Sit to/from Stand Sit to Stand: Mod assist;+2 physical assistance         General transfer comment: Assist of 2 to stand from EOB with demonstration and  tactile cues to initiate. Therapist blocking right knee for stability and pt reaching to hold onto IV pole for LUE.   Ambulation/Gait                 Stairs            Wheelchair Mobility    Modified Rankin (Stroke Patients Only) Modified Rankin (Stroke Patients Only) Pre-Morbid Rankin Score: No symptoms Modified Rankin: Severe disability     Balance Overall balance assessment: Needs assistance Sitting-balance support: Feet supported;Single extremity supported Sitting balance-Leahy Scale: Fair Sitting balance - Comments: No support needed in static sitting using LUE for support.  Able to perform AROM BLEs with posterior lean but no LOB.   Standing balance support: During functional activity Standing balance-Leahy Scale: Poor Standing balance comment: Requires Mod A of 2 for standing balance with manual assist for hip extension and therapist stabilizes right knee.                     Cognition Arousal/Alertness: Awake/alert Behavior During Therapy: Flat affect Overall Cognitive Status: Difficult to assess     Current Attention Level: Focused   Following Commands: Follows one step commands inconsistently     Problem Solving: Slow processing;Decreased initiation;Requires verbal cues General Comments: Nods yes to all questions asked, appropriate or not. Right neglect noted. Following verbal commands today- simple 1 step with increased time and assist with initiation.    Exercises General Exercises - Lower Extremity Long Arc Quad: Both;10 reps;Seated    General Comments General comments (skin integrity, edema, etc.): VSS throughout.      Pertinent Vitals/Pain Pain Assessment: Faces Faces  Pain Scale: Hurts little more Pain Location: grimace Pain Descriptors / Indicators: Grimacing Pain Intervention(s): Monitored during session;Repositioned    Home Living                      Prior Function            PT Goals (current goals can now  be found in the care plan section) Progress towards PT goals: Progressing toward goals    Frequency    Min 4X/week      PT Plan Current plan remains appropriate    Co-evaluation             End of Session Equipment Utilized During Treatment: Other (comment) (vent) Activity Tolerance: Patient tolerated treatment well Patient left: in bed;with call bell/phone within reach;with bed alarm set     Time: 1000-1023 PT Time Calculation (min) (ACUTE ONLY): 23 min  Charges:  $Therapeutic Activity: 23-37 mins                    G Codes:      Winfield Caba A Jerett Odonohue 10/06/2016, 11:01 AM Wray Kearns, PT, DPT [phone]

## 2016-10-07 ENCOUNTER — Inpatient Hospital Stay (HOSPITAL_COMMUNITY): Payer: Medicare Other

## 2016-10-07 ENCOUNTER — Encounter (HOSPITAL_COMMUNITY): Payer: Medicare Other

## 2016-10-07 LAB — BASIC METABOLIC PANEL
ANION GAP: 5 (ref 5–15)
BUN: 57 mg/dL — ABNORMAL HIGH (ref 6–20)
CO2: 25 mmol/L (ref 22–32)
Calcium: 9.5 mg/dL (ref 8.9–10.3)
Chloride: 121 mmol/L — ABNORMAL HIGH (ref 101–111)
Creatinine, Ser: 1.35 mg/dL — ABNORMAL HIGH (ref 0.44–1.00)
GFR, EST AFRICAN AMERICAN: 43 mL/min — AB (ref >60)
GFR, EST NON AFRICAN AMERICAN: 37 mL/min — AB (ref >60)
GLUCOSE: 240 mg/dL — AB (ref 65–99)
POTASSIUM: 4.3 mmol/L (ref 3.5–5.1)
SODIUM: 151 mmol/L — AB (ref 135–145)

## 2016-10-07 LAB — TRIGLYCERIDES: TRIGLYCERIDES: 215 mg/dL — AB (ref <150)

## 2016-10-07 LAB — GLUCOSE, CAPILLARY
GLUCOSE-CAPILLARY: 238 mg/dL — AB (ref 65–99)
GLUCOSE-CAPILLARY: 197 mg/dL — AB (ref 65–99)
GLUCOSE-CAPILLARY: 109 mg/dL — AB (ref 65–99)
GLUCOSE-CAPILLARY: 106 mg/dL — AB (ref 65–99)
GLUCOSE-CAPILLARY: 153 mg/dL — AB (ref 65–99)

## 2016-10-07 LAB — SODIUM: Sodium: 152 mmol/L — ABNORMAL HIGH (ref 135–145)

## 2016-10-07 MED ORDER — IPRATROPIUM-ALBUTEROL 0.5-2.5 (3) MG/3ML IN SOLN
3.0000 mL | RESPIRATORY_TRACT | Status: DC | PRN
  Administered 2016-10-15: 3 mL via RESPIRATORY_TRACT
  Filled 2016-10-07: qty 3

## 2016-10-07 NOTE — Progress Notes (Signed)
Vecuronium and Etomidate pull for trach procedure.  Vecuronium 4 mg and Etomidate 40 mg wasted in sharps.  Witness by Maudie Mercury.

## 2016-10-07 NOTE — Procedures (Signed)
Intubation Procedure Note [name] [id number] [birth year]  Procedure: Intubation Indications: ETT needed to be changed for larger for bronch for trach  Procedure Details Consent: Unable to obtain consent because of emergent medical necessity. Time Out: Verified patient identification, verified procedure, site/side was marked, verified correct patient position, special equipment/implants available, medications/allergies/relevent history reviewed, required imaging and test results available.  Performed  Maximum sterile technique was used including cap, gloves and hand hygiene.  MAC and 3 placed in mouth, placed bouche tube exchanger in ett , removed it, all done direct visualization,. Airway look swollen diffuse and redundant. Placed new 7.5, hit resistance at cords and aborted. Replaced with 7.0 easily.    Evaluation Hemodynamic Status: BP stable throughout; O2 sats: stable throughout Patient's Current Condition: stable Complications: No apparent complications Patient did tolerate procedure well. Chest X-ray ordered to verify placement.  CXR: pending.   [name] 10/07/2016

## 2016-10-07 NOTE — Procedures (Signed)
Bronchoscopy  for Percutaneous  Tracheostomy  Name: [name] MRN: [id number] DOB: [birth year] Procedure: Bronchoscopy for Percutaneous Tracheostomy Indications: Diagnostic evaluation of the airways In conjunction with: Dr. Titus Mould   Procedure Details Consent: Risks of procedure as well as the alternatives and risks of each were explained to the (patient/caregiver).  Consent for procedure obtained. Time Out: Verified patient identification, verified procedure, site/side was marked, verified correct patient position, special equipment/implants available, medications/allergies/relevent history reviewed, required imaging and test results available.  Performed  In preparation for procedure, patient was given 100% FiO2 and bronchoscope lubricated. Sedation: Benzodiazepines, Muscle relaxants and Etomidate  Airway entered and the following  were examined: trachea to carina. ET pulled back and observation of Angiocath, wire, dilators and trach were observed. Procedures performed: Endotracheal Tube retracted in 2 cm increments. Cannulation of airway observed. Dilation observed. Placement of trachel tube  observed . No overt complications. Bronchoscope removed.    Evaluation Hemodynamic Status: BP stable throughout; O2 sats: stable throughout Patient's Current Condition: stable Specimens:  None Complications: No apparent complications Patient did tolerate procedure well.   Richardson Landry Minor ACNP Maryanna Shape PCCM Pager [phone] till 3 pm If no answer page [phone] 10/07/2016, 12:15 PM  Rush Farmer, M.D. [facility] Pulmonary/Critical Care Medicine. Pager: [phone]. After hours pager: [phone].

## 2016-10-07 NOTE — Progress Notes (Signed)
Inpatient Diabetes Program Recommendations  AACE/ADA: New Consensus Statement on Inpatient Glycemic Control (2015)  Target Ranges:  Prepandial:   less than 140 mg/dL      Peak postprandial:   less than 180 mg/dL (1-2 hours)      Critically ill patients:  140 - 180 mg/dL   Lab Results  Component Value Date   GLUCAP 109 (H) 10/07/2016   HGBA1C 8.1 (H) 10/01/2016    Review of Glycemic Control  Blood sugars > goal.  Inpatient Diabetes Program Recommendations:     Consider ICU Glycemic Control Protocol for improvement of blood sugars.  Continue to follow. Thank you. Lorenda Peck, RD, LDN, CDE Inpatient Diabetes Coordinator [phone]

## 2016-10-07 NOTE — Progress Notes (Signed)
OT Cancellation Note  Patient Details Name: [name] MRN: [id number] DOB: 09-17-1941   Cancelled Treatment:    Reason Eval/Treat Not Completed: Patient at procedure or test/ unavailable Trach / peg pending   Parke Poisson B 10/07/2016, 1:32 PM

## 2016-10-07 NOTE — Procedures (Signed)
Bronchoscopy Procedure Note EUA LUNT [id number] 1941-05-27  Procedure: Bronchoscopy Indications: assess ett placement post attempt at ETT change  Procedure Details Consent: Unable to obtain consent because of emergent medical necessity. Time Out: Verified patient identification, verified procedure, site/side was marked, verified correct patient position, special equipment/implants available, medications/allergies/relevent history reviewed, required imaging and test results available.  Performed  In preparation for procedure, patient was given 100% FiO2 and bronchoscope lubricated. Sedation: Benzodiazepines  Airway entered and the following bronchi were examined: RUL, RML, RLL and Bronchi.   Procedures performed: diagnostic Bronchoscope removed.  , Patient placed back on 100% FiO2 at conclusion of procedure.    Evaluation Hemodynamic Status: BP stable throughout; O2 sats: stable throughout Patient's Current Condition: stable Specimens:  None Complications: No apparent complications Patient did tolerate procedure well.   [name]. 10/07/2016  1. Placed bronch through 7.0, noted at carina.  Back edup to 2 cm above . Airway look edematous mild and has moderate tracheal malacia

## 2016-10-07 NOTE — Care Management Important Message (Signed)
Important Message  Patient Details  Name: [name] MRN: [id number] Date of Birth: Jun 21, 1941   Medicare Important Message Given:  Other (see comment)    Stites, Terryl Niziolek Abena 10/07/2016, 10:57 AM

## 2016-10-07 NOTE — Procedures (Signed)
Bedside Tracheostomy Insertion Procedure Note   Patient Details:   Name: [name] DOB: 09-Nov-1941 MRN: [id number]  Procedure: Tracheostomy  Pre Procedure Assessment: ET Tube Size:7.0 ET Tube secured at lip (cm):22 Bite block in place: yes Breath Sounds:bilateral breath sounds  Post Procedure Assessment: BP (!) 165/68   Pulse 62   Temp 97.9 F (36.6 C) (Oral)   Resp 20   Ht 5\' 7"  (1.702 m)   Wt 190 lb 0.6 oz (86.2 kg)   SpO2 100%   BMI 29.76 kg/m  O2 sats: 95 Complications:none noted Patient did tolerate procedure well Tracheostomy Brand:shiley Tracheostomy Style:cuffed Tracheostomy Size: 6.0Tracheostomy Secured [id number] and velcro ties Tracheostomy Placement Confirmation:direct visualization with bronchoscope    Jama Flavors 10/07/2016, 3:59 PM

## 2016-10-07 NOTE — Procedures (Addendum)
Name:  [name] MRN:  [id number] DOB:  03/18/1941  OPERATIVE NOTE  Procedure:  Percutaneous tracheostomy.  Indications:  Ventilator-dependent respiratory failure.  Consent:  Procedure, alternatives, risks and benefits discussed with medical POA.  Questions answered.  Consent obtained.  Anesthesia:  Vec, prop, versed, fent  Procedure summary:  Appropriate equipment was assembled.  The patient was identified as [name] and safety timeout was performed. The patient was placed in supine position with a towel roll behind shoulder blades and neck extended.  Sterile technique was used. The patient's neck and upper chest were prepped using chlorhexidine / alcohol scrub and the field was draped in usual sterile fashion with full body drape. 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 Approximately two fingerbreadths above the sternal notch, a verticle incision was created with a scalpel after local infiltration with 0.2% Lidocaine. I disected down and noted large goiters inferior ( avoided) and rt lower ( avoied) and just superior to 1st ics , was able to dissect through using my finger without any bleeding to airway and create a path without direct to airway. Then, using Seldinger technique and a percutaneous tracheostomy set, the trachea was entered with a 14 gauge needle with an overlying sheath. This was all confirmed under direct visualization of a fiberoptic flexible bronchoscope. Entrance into the trachea was identified through the third tracheal ring interspace. Following this, a guidewire was inserted. The needle was removed, leaving the sheath and the guidewire intact. Next, the sheath was removed and a small dilator was inserted. The tracheal rings were then dilated. A #6 Shiley was then opened. The balloon was checked. It was placed over a tracheal dilator, which was then  advanced over the guidewire and through the previously dilated tract. The Shiley tracheostomy tube was noted to pass in the trachea with little resistance. The guidewire and dilator tubes were removed from the trachea. An inner cannula was placed through the tracheostomy tube. The tracheostomy was then secured at the anterior neck with 4 monofilament sutures. The oral endotracheal tube was removed and the ventilator was attached to the newly placed tracheostomy tube. Adequate tidal volumes were noted. The cuff was inflated and no evidence of air leak was noted. No evidence of bleeding was noted. At this point, the procedure was concluded. Post-procedure chest x-ray was ordered.  Complications:  No immediate complications were noted.  Hemodynamic parameters and oxygenation remained stable throughout the procedure.  Estimated blood loss:  Less then 1 mL.  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 Will need US neck consideration with trach if able then neck ct in future during this stay

## 2016-10-07 NOTE — Progress Notes (Signed)
Physical Therapy Treatment Patient Details Name: [name] MRN: [id number] DOB: 10-Dec-1940 Today's Date: 10/07/2016    History of Present Illness Patient is a 75 y/o female with hx of DM, PVD, PAD, MI, HTN, gout, COPD, CAD, lower limb ischemia presents with right sided weakness. 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 Found to have Nondisplaced fracture of the right superior and inferior pubic rami. 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     PT Comments    Pt initially drowsy, but able to be aroused and participates with PT.  When pt is given functional directions followed by gestures, she initiates movements and attempts to follow directions, but only to L side.  Noted plan for trach later today.  Will continue to follow along and feel pt would be appropriate for CIR once medically appropriate.    Follow Up Recommendations  CIR     Equipment Recommendations  None recommended by PT    Recommendations for Other Services       Precautions / Restrictions Precautions Precautions: Fall Precaution Comments: wrist restraints; vent; PEG Restrictions Weight Bearing Restrictions: No    Mobility  Bed Mobility Overal bed mobility: Needs Assistance;+2 for physical assistance Bed Mobility: Supine to Sit;Sit to Supine     Supine to sit: Mod assist;+2 for physical assistance Sit to supine: Mod assist;+2 for physical assistance   General bed mobility comments: When tp given simple verbal followed by gestural direction, she is then able to initiate mobility when movement is towards her L side.  Not following any directions to R side due to neglect.    Transfers                    Ambulation/Gait                 Stairs            Wheelchair Mobility    Modified Rankin (Stroke Patients Only)        Balance Overall balance assessment: Needs assistance Sitting-balance support: No upper extremity supported;Feet supported Sitting balance-Leahy Scale: Fair Sitting balance - Comments: Initially needed MinA to maintain balance, but quickly progresses to MinG.  R UE rests next to her on bed, but is not functionally being used for support.  Worked on reaching tasks with L UE and pt only able to come to target without A, but not coming to target when it is brought closer to midline.                              Cognition Arousal/Alertness: Awake/alert (Drowsy) Behavior During Therapy: Flat affect Overall Cognitive Status: Difficult to assess Area of Impairment: Attention;Following commands   Current Attention Level: Focused   Following Commands: Follows one step commands inconsistently       General Comments: Attempted to have pt answer yes/no questions, but pt not appropriate with answers.  pt with L gaze preference and difficulty bringing eyes to midline when name is called, but nods head when name is called.  pt followed commands with functional directions and using gestures.      Exercises      General Comments        Pertinent Vitals/Pain Pain Assessment: Faces Faces Pain Scale: No hurt    Home Living  Prior Function            PT Goals (current goals can now be found in the care plan section) Acute Rehab PT Goals Patient Stated Goal: pt unable to state PT Goal Formulation: Patient unable to participate in goal setting Time For Goal Achievement: 10/19/16 Potential to Achieve Goals: Fair Progress towards PT goals: Progressing toward goals    Frequency    Min 4X/week      PT Plan Current plan remains appropriate    Co-evaluation             End of Session Equipment Utilized During Treatment:  (Vent) Activity Tolerance: Patient tolerated treatment well Patient left: in bed;with call bell/phone within reach;with  restraints reapplied     Time: [id number] PT Time Calculation (min) (ACUTE ONLY): 19 min  Charges:  $Therapeutic Activity: 8-22 mins                    G CodesCatarina Hartshorn, [place] 10/07/2016, 10:03 AM

## 2016-10-07 NOTE — Progress Notes (Addendum)
PULMONARY / CRITICAL CARE MEDICINE   Name: [name] MRN: [id number] DOB: [birth year]    ADMISSION DATE:  09/30/2016  REFERRING MD:  Dr. Cletus Gash, ER  CHIEF COMPLAINT:  Rt sided weakness.  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  SUBJECTIVE:  Tolerating pressure support 10/5.  VITAL SIGNS: BP (!) 156/64   Pulse 67   Temp 98.6 F (37 C) (Axillary)   Resp 17   Ht 5\' 7"  (1.702 m)   Wt 190 lb 0.6 oz (86.2 kg)   SpO2 100%   BMI 29.76 kg/m   VENTILATOR SETTINGS: Vent Mode: CPAP;PSV FiO2 (%):  [40 %] 40 % Set Rate:  [16 bmp] 16 bmp Vt Set:  [500 mL] 500 mL PEEP:  [5 cmH20] 5 cmH20 Pressure Support:  [10 cmH20] 10 cmH20 Plateau Pressure:  [20 cmH20-21 cmH20] 21 cmH20  INTAKE / OUTPUT: I/O last 3 completed shifts: In: 3129.2 [I.V.:1824.2; NG/GT:1305] Out: -   PHYSICAL EXAMINATION: General: alert Neuro: moves Lt side HEENT: ETT in place Cardiovascular:  Regular, no murmur Lungs:  No wheeze/rales Abdomen:  Soft, decreased BS, non tender Musculoskeletal:  Lt foot TMA, no edema Skin:  Chronic venous stasis changes  LABS:  BMET  Recent Labs Lab 10/05/16 0050  10/06/16 0430  10/06/16 1800 10/06/16 2245 10/07/16 0420  NA 152*  < > 153*  < > 151* 152* 151*  K 4.0  --  4.0  --   --   --  4.3  CL 124*  --  120*  --   --   --  121*  CO2 24  --  26  --   --   --  25  BUN 43*  --  52*  --   --   --  57*  CREATININE 1.32*  --  1.41*  --   --   --  1.35*  GLUCOSE 232*  --  227*  --   --   --  240*  < > = values in this interval not displayed.  Electrolytes  Recent Labs Lab 10/03/16 0351 10/03/16 1641  10/05/16 0050 10/06/16 0430 10/07/16 0420  CALCIUM 9.0  --   < > 9.4 9.8 9.5  MG  2.2 2.2  --   --  2.4  --   PHOS 2.5 2.1*  --   --  2.9  --   < > = values in this interval not displayed.  CBC  Recent Labs Lab 10/04/16 1045 10/05/16 0540 10/06/16 0430  WBC 8.9 11.6* 9.1  HGB 9.2* 8.9* 8.7*  HCT 30.7* 29.7* 29.0*  PLT 145* 142* 136*    Coag's  Recent Labs Lab 09/30/16 0940 10/06/16 0900  APTT 24 33  INR 1.04 1.11    Sepsis Markers  Recent Labs Lab 09/30/16 0940  09/30/16 0954 09/30/16 1222 09/30/16 1341  LATICACIDVEN  --   < > 2.05* 1.9 1.92*  PROCALCITON <0.10  --   --   --   --   < > = values in this interval not displayed.  ABG  Recent Labs Lab 09/30/16 1056 10/02/16 0148 10/02/16 1637  PHART 7.320* 7.461* 7.343*  PCO2ART 54.5* 32.4 41.3  PO2ART 427.0* 80.0* 88.6    Liver Enzymes  Recent Labs Lab 09/30/16 0940 10/02/16 0403 10/03/16 0351  AST 30 34 45*  ALT 21 20 30   ALKPHOS 66 46 53  BILITOT 0.7 1.0 0.9  ALBUMIN 3.3* 2.7* 3.0*    Cardiac Enzymes No results for input(s): TROPONINI, PROBNP in the last 168 hours.  Glucose  Recent Labs Lab 10/06/16 1235 10/06/16 1530 10/06/16 2018 10/06/16 2352 10/07/16 0322 10/07/16 0726  GLUCAP 187* 167* 190* 149* 238* 197*    Imaging No results found.   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, aneurysmal dilation of Lt common iliac, atherosclerosis Echo 11/06 >> EF 40 to 45%, grade 1 DD, PAS 51 mmHg MRI brain 11/07 >> acute infarct Lt MCA with hemorrhagic conversion, cytotoxic edema  CULTURES: Blood 11/03 >> Urine 11/03 >>   ANTIBIOTICS: Vancomycin 11/03 >> 11/03 Zosyn 11/03 >> 11/03   SIGNIFICANT EVENTS: 11/03 Admit, neuro consulted 11/04 Start 3% NS 11/05 Extubated >> reintubated  LINES/TUBES: ETT 11/03 >> 11/05 ETT 11/05 >>  Lt PICC 11/05 >>    DISCUSSION: 75 yo female former smoker with altered mental status, HTN emergency, Rt sided weakness, compromised airway from Lt MCA infarct.  Also found to have non displaced Rt pubic rami fracture.  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 - neurology signed off 11/08 - RASS goal 0 to -1   HTN emergency. Chronic systolic CHF. - continue coreg  Compromised airway in setting of CVA. Hx of COPD/asthma. Failed extubation attempt 11/05. - full vent support - for tracheostomy - f/u CXR - scheduled pulmicort, duoneb  Hypernatremia. - add free water when able to resume tube feeds after trach  Pubic rami fracture. - consult orthopedics  DM type II. - SSI with lantus  Nutrition. Hx of GERD. - tube feeds if unable to extubate soon  CKD 2. - monitor renal fx, urine outpt  DVT prophylaxis - SQ heparin, SCDs SUP - Pepcid Goals of care - Full code  CC time 31 minutes.  Chesley Mires, MD [facility] Pulmonary/Critical Care 10/07/2016, 8:29 AM Pager:  [phone] After 3pm call: [phone]

## 2016-10-08 ENCOUNTER — Inpatient Hospital Stay (HOSPITAL_COMMUNITY): Payer: Medicare Other

## 2016-10-08 DIAGNOSIS — I6302 Cerebral infarction due to thrombosis of basilar artery: Secondary | ICD-10-CM

## 2016-10-08 LAB — CBC
HEMATOCRIT: 27 % — AB (ref 36.0–46.0)
HEMOGLOBIN: 8.3 g/dL — AB (ref 12.0–15.0)
MCH: 30.9 pg (ref 26.0–34.0)
MCHC: 30.7 g/dL (ref 30.0–36.0)
MCV: 100.4 fL — AB (ref 78.0–100.0)
Platelets: 161 10*3/uL (ref 150–400)
RBC: 2.69 MIL/uL — AB (ref 3.87–5.11)
RDW: 15.3 % (ref 11.5–15.5)
WBC: 6.6 10*3/uL (ref 4.0–10.5)

## 2016-10-08 LAB — URINALYSIS, ROUTINE W REFLEX MICROSCOPIC
BILIRUBIN URINE: NEGATIVE
GLUCOSE, UA: NEGATIVE mg/dL
KETONES UR: NEGATIVE mg/dL
Nitrite: NEGATIVE
PROTEIN: 100 mg/dL — AB
Specific Gravity, Urine: 1.012 (ref 1.005–1.030)
pH: 5.5 (ref 5.0–8.0)

## 2016-10-08 LAB — GLUCOSE, CAPILLARY
GLUCOSE-CAPILLARY: 191 mg/dL — AB (ref 65–99)
GLUCOSE-CAPILLARY: 207 mg/dL — AB (ref 65–99)
GLUCOSE-CAPILLARY: 166 mg/dL — AB (ref 65–99)
GLUCOSE-CAPILLARY: 193 mg/dL — AB (ref 65–99)
Glucose-Capillary: 225 mg/dL — ABNORMAL HIGH (ref 65–99)
Glucose-Capillary: 191 mg/dL — ABNORMAL HIGH (ref 65–99)

## 2016-10-08 LAB — BASIC METABOLIC PANEL
ANION GAP: 7 (ref 5–15)
BUN: 54 mg/dL — AB (ref 6–20)
CHLORIDE: 120 mmol/L — AB (ref 101–111)
CO2: 24 mmol/L (ref 22–32)
Calcium: 9 mg/dL (ref 8.9–10.3)
Creatinine, Ser: 1.22 mg/dL — ABNORMAL HIGH (ref 0.44–1.00)
GFR, EST AFRICAN AMERICAN: 49 mL/min — AB (ref >60)
GFR, EST NON AFRICAN AMERICAN: 42 mL/min — AB (ref >60)
Glucose, Bld: 210 mg/dL — ABNORMAL HIGH (ref 65–99)
POTASSIUM: 3.8 mmol/L (ref 3.5–5.1)
SODIUM: 151 mmol/L — AB (ref 135–145)

## 2016-10-08 LAB — URINE MICROSCOPIC-ADD ON: SQUAMOUS EPITHELIAL / LPF: NONE SEEN

## 2016-10-08 MED ORDER — FUROSEMIDE 10 MG/ML IJ SOLN
40.0000 mg | Freq: Four times a day (QID) | INTRAMUSCULAR | Status: AC
  Administered 2016-10-08 (×2): 40 mg via INTRAVENOUS
  Filled 2016-10-08 (×2): qty 4

## 2016-10-08 NOTE — Progress Notes (Signed)
PULMONARY / CRITICAL CARE MEDICINE   Name: [name] MRN: [id number] DOB: Dec 27, 1940    ADMISSION DATE:  09/30/2016  REFERRING MD:  Dr. Cletus Gash, ER  CHIEF COMPLAINT:  Rt sided weakness.  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  SUBJECTIVE:   No acute events overnight Had tracheostomy yesterday  VITAL SIGNS: BP (!) 153/61   Pulse 63   Temp 100.3 F (37.9 C) (Oral)   Resp (!) 27   Ht 5\' 7"  (1.702 m)   Wt 85.9 kg (189 lb 6 oz)   SpO2 100%   BMI 29.66 kg/m   VENTILATOR SETTINGS: Vent Mode: PSV;CPAP FiO2 (%):  [40 %] 40 % Set Rate:  [16 bmp] 16 bmp Vt Set:  [500 mL] 500 mL PEEP:  [5 cmH20] 5 cmH20 Pressure Support:  [18 cmH20] 18 cmH20 Plateau Pressure:  [21 cmH20-26 cmH20] 21 cmH20  INTAKE / OUTPUT: I/O last 3 completed shifts: In: 3798.1 [I.V.:2103.8; NG/GT:1694.3] Out: -   PHYSICAL EXAMINATION: General: sleepy on vent  Neuro: opens eyes HEENT: tracheostomy in place Cardiovascular:  RRR, no mgr Lungs:  Few crackles, vent supported breaths Abdomen:  Soft, BS+, soft, non tender Skin:  Chronic venous stasis changes  LABS:  BMET  Recent Labs Lab 10/06/16 0430  10/06/16 2245 10/07/16 0420 10/08/16 0342  NA 153*  < > 152* 151* 151*  K 4.0  --   --  4.3 3.8  CL 120*  --   --  121* 120*  CO2 26  --   --  25 24  BUN 52*  --   --  57* 54*  CREATININE 1.41*  --   --  1.35* 1.22*  GLUCOSE 227*  --   --  240* 210*  < > = values in this interval not displayed.  Electrolytes  Recent Labs Lab 10/03/16 0351 10/03/16 1641  10/06/16 0430 10/07/16 0420 10/08/16 0342  CALCIUM 9.0  --   < > 9.8 9.5 9.0  MG 2.2 2.2  --  2.4  --   --   PHOS 2.5 2.1*  --  2.9  --   --   < > =  values in this interval not displayed.  CBC  Recent Labs Lab 10/05/16 0540 10/06/16 0430 10/08/16 0342  WBC 11.6* 9.1 6.6  HGB 8.9* 8.7* 8.3*  HCT 29.7* 29.0* 27.0*  PLT 142* 136* 161    Coag's  Recent Labs Lab 10/06/16 0900  APTT 33  INR 1.11    Sepsis Markers No results for input(s): LATICACIDVEN, PROCALCITON, O2SATVEN in the last 168 hours.  ABG  Recent Labs Lab 10/02/16 0148 10/02/16 1637  PHART 7.461* 7.343*  PCO2ART 32.4 41.3  PO2ART 80.0* 88.6    Liver Enzymes  Recent Labs Lab 10/02/16 0403 10/03/16 0351  AST 34 45*  ALT 20 30  ALKPHOS 46 53  BILITOT 1.0 0.9  ALBUMIN 2.7* 3.0*    Cardiac Enzymes No results for input(s): TROPONINI, PROBNP in the last 168 hours.  Glucose  Recent Labs Lab 10/07/16 1530 10/07/16 1932 10/08/16 0050 10/08/16 0334 10/08/16 0848 10/08/16 1211  GLUCAP 106* 153* 225* 191* 207* 191*    Imaging Dg Abd 1 View  Result Date: 10/07/2016 CLINICAL DATA:  Evaluate  NG tube placement. EXAM: ABDOMEN - 1 VIEW COMPARISON:  10/02/2016 FINDINGS: The feeding tube tip is in the projecting over the lower abdomen. This is in the projection of the expected location of the descending duodenum. Bowel loops appear nondilated. IMPRESSION: 1. The tip of the feeding tube projects over the expected location of the descending duodenum. Electronically Signed   By: Kerby Moors M.D.   On: 10/07/2016 17:08   Dg Chest Port 1 View  Result Date: 10/08/2016 CLINICAL DATA:  ETT.  Respiratory failure. EXAM: PORTABLE CHEST 1 VIEW COMPARISON:  October 07, 2016 FINDINGS: A tracheostomy tube is again identified. A feeding tube terminates below today's film. No pneumothorax. Probable layering effusion on the right. No overt edema. Left basilar atelectasis. No other interval changes. IMPRESSION: Stable tracheostomy. Opacity in the right base is mildly more prominent, favored to represent layering effusion and underlying atelectasis. Recommend  attention on follow-up. Mild atelectasis in the left base. Stable left PICC line. Electronically Signed   By: Dorise Bullion III M.D   On: 10/08/2016 08:10   Dg Abd Portable 1v  Result Date: 10/08/2016 CLINICAL DATA:  Evaluate enteric tube placement. EXAM: PORTABLE ABDOMEN - 1 VIEW COMPARISON:  Common radiograph 10/07/2016. FINDINGS: Enteric tube tip projects over the gastric antrum. Unremarkable bowel gas pattern. Lumbar spine degenerative changes. IMPRESSION: Enteric tube tip projects over the gastric antrum. Electronically Signed   By: Lovey Newcomer M.D.   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, aneurysmal dilation of Lt common iliac, atherosclerosis Echo 11/06 >> EF 40 to 45%, grade 1 DD, PAS 51 mmHg MRI brain 11/07 >> acute infarct Lt MCA with hemorrhagic conversion, cytotoxic edema  CULTURES: Blood 11/03 >> Urine 11/03 >>   ANTIBIOTICS: Vancomycin 11/03 >> 11/03 Zosyn 11/03 >> 11/03   SIGNIFICANT EVENTS: 11/03 Admit, neuro consulted 11/04 Start 3% NS 11/05 Extubated >> reintubated  LINES/TUBES: ETT 11/03 >> 11/05 ETT 11/05 >>  Lt PICC 11/05 >>   DISCUSSION: 75 yo female former smoker with altered mental status, HTN emergency, Rt sided weakness, compromised airway from Lt MCA infarct.  Also found to have non displaced Rt pubic rami fracture.  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 Hx of COPD/asthma Failed extubation attempt 11/05, continue tracheostomy  management - full vent support - tracheostomy care - f/u CXR - scheduled pulmicort, duoneb  Hypernatremia - continue tube feedings - continue free water  Pubic rami fracture - per orthopedics can weight bear as tolerated with walker  DM type II - SSI with lantus  Nutrition Hx of GERD - continue tube feeding  CKD 2. - monitor renal fx, urine outpt  DVT prophylaxis - SQ heparin, SCDs SUP - Pepcid Goals of care - Full code  CC time 33 minutes.  Roselie Awkward, MD [facility] Pager: [phone] Cell: [phone] After 3pm or if no response, call [phone]

## 2016-10-09 ENCOUNTER — Inpatient Hospital Stay (HOSPITAL_COMMUNITY): Payer: Medicare Other

## 2016-10-09 DIAGNOSIS — I6300 Cerebral infarction due to thrombosis of unspecified precerebral artery: Secondary | ICD-10-CM

## 2016-10-09 LAB — BASIC METABOLIC PANEL
ANION GAP: 7 (ref 5–15)
BUN: 58 mg/dL — ABNORMAL HIGH (ref 6–20)
CALCIUM: 8.7 mg/dL — AB (ref 8.9–10.3)
CHLORIDE: 118 mmol/L — AB (ref 101–111)
CO2: 24 mmol/L (ref 22–32)
Creatinine, Ser: 1.38 mg/dL — ABNORMAL HIGH (ref 0.44–1.00)
GFR calc non Af Amer: 36 mL/min — ABNORMAL LOW (ref >60)
GFR, EST AFRICAN AMERICAN: 42 mL/min — AB (ref >60)
GLUCOSE: 192 mg/dL — AB (ref 65–99)
POTASSIUM: 3.5 mmol/L (ref 3.5–5.1)
Sodium: 149 mmol/L — ABNORMAL HIGH (ref 135–145)

## 2016-10-09 LAB — CBC WITH DIFFERENTIAL/PLATELET
Basophils Absolute: 0 10*3/uL (ref 0.0–0.1)
Basophils Relative: 0 %
Eosinophils Absolute: 0.1 10*3/uL (ref 0.0–0.7)
Eosinophils Relative: 1 %
HEMATOCRIT: 25.1 % — AB (ref 36.0–46.0)
HEMOGLOBIN: 7.7 g/dL — AB (ref 12.0–15.0)
LYMPHS ABS: 1.3 10*3/uL (ref 0.7–4.0)
LYMPHS PCT: 16 %
MCH: 30.6 pg (ref 26.0–34.0)
MCHC: 30.7 g/dL (ref 30.0–36.0)
MCV: 99.6 fL (ref 78.0–100.0)
MONO ABS: 0.8 10*3/uL (ref 0.1–1.0)
MONOS PCT: 10 %
NEUTROS ABS: 5.8 10*3/uL (ref 1.7–7.7)
NEUTROS PCT: 73 %
Platelets: 164 10*3/uL (ref 150–400)
RBC: 2.52 MIL/uL — ABNORMAL LOW (ref 3.87–5.11)
RDW: 15.3 % (ref 11.5–15.5)
WBC: 8.1 10*3/uL (ref 4.0–10.5)

## 2016-10-09 LAB — GLUCOSE, CAPILLARY
GLUCOSE-CAPILLARY: 155 mg/dL — AB (ref 65–99)
Glucose-Capillary: 190 mg/dL — ABNORMAL HIGH (ref 65–99)
Glucose-Capillary: 227 mg/dL — ABNORMAL HIGH (ref 65–99)

## 2016-10-09 MED ORDER — CARVEDILOL 12.5 MG PO TABS
12.5000 mg | ORAL_TABLET | Freq: Two times a day (BID) | ORAL | Status: DC
  Administered 2016-10-09 – 2016-10-11 (×2): 12.5 mg via ORAL
  Filled 2016-10-09 (×4): qty 1

## 2016-10-09 MED ORDER — HYDRALAZINE HCL 20 MG/ML IJ SOLN
10.0000 mg | INTRAMUSCULAR | Status: DC | PRN
  Administered 2016-10-10 (×2): 20 mg via INTRAVENOUS
  Filled 2016-10-09 (×3): qty 1

## 2016-10-09 MED ORDER — FUROSEMIDE 10 MG/ML IJ SOLN
40.0000 mg | Freq: Four times a day (QID) | INTRAMUSCULAR | Status: AC
  Administered 2016-10-09 (×2): 40 mg via INTRAVENOUS
  Filled 2016-10-09 (×2): qty 4

## 2016-10-09 NOTE — Progress Notes (Signed)
PULMONARY / CRITICAL CARE MEDICINE   Name: [name] MRN: [id number] DOB: 12/24/1940    ADMISSION DATE:  09/30/2016  REFERRING MD:  Dr. Cletus Gash, ER  CHIEF COMPLAINT:  Rt sided weakness.  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  SUBJECTIVE:   Quiet night Maintaining on pressure support Low grade temp overnight  VITAL SIGNS: BP (!) 127/56   Pulse 66   Temp (!) 100.6 F (38.1 C) (Oral)   Resp (!) 29   Ht 5\' 7"  (1.702 m)   Wt 83.4 kg (183 lb 13.8 oz)   SpO2 100%   BMI 28.80 kg/m   VENTILATOR SETTINGS: Vent Mode: PSV;CPAP FiO2 (%):  [30 %-40 %] 30 % Set Rate:  [16 bmp] 16 bmp Vt Set:  [500 mL] 500 mL PEEP:  [5 cmH20] 5 cmH20 Pressure Support:  [18 cmH20] 18 cmH20 Plateau Pressure:  [19 cmH20-22 cmH20] 19 cmH20  INTAKE / OUTPUT: I/O last 3 completed shifts: In: 3543.4 [I.V.:2206.9; NG/GT:1336.5] Out: 2575 [Urine:2575]  PHYSICAL EXAMINATION: General: sleepy on vent  Neuro: opens eyes HEENT: tracheostomy in place Cardiovascular:  RRR, no mgr Lungs:  Few crackles, vent supported breaths Abdomen:  Soft, BS+, soft, non tender Skin:  Chronic venous stasis changes  LABS:  BMET  Recent Labs Lab 10/07/16 0420 10/08/16 0342 10/09/16 0430  NA 151* 151* 149*  K 4.3 3.8 3.5  CL 121* 120* 118*  CO2 25 24 24   BUN 57* 54* 58*  CREATININE 1.35* 1.22* 1.38*  GLUCOSE 240* 210* 192*    Electrolytes  Recent Labs Lab 10/03/16 0351 10/03/16 1641  10/06/16 0430 10/07/16 0420 10/08/16 0342 10/09/16 0430  CALCIUM 9.0  --   < > 9.8 9.5 9.0 8.7*  MG 2.2 2.2  --  2.4  --   --   --   PHOS 2.5 2.1*  --  2.9  --   --   --   < > = values in this interval not displayed.  CBC  Recent  Labs Lab 10/06/16 0430 10/08/16 0342 10/09/16 0430  WBC 9.1 6.6 8.1  HGB 8.7* 8.3* 7.7*  HCT 29.0* 27.0* 25.1*  PLT 136* 161 164    Coag's  Recent Labs Lab 10/06/16 0900  APTT 33  INR 1.11    Sepsis Markers No results for input(s): LATICACIDVEN, PROCALCITON, O2SATVEN in the last 168 hours.  ABG  Recent Labs Lab 10/02/16 1637  PHART 7.343*  PCO2ART 41.3  PO2ART 88.6    Liver Enzymes  Recent Labs Lab 10/03/16 0351  AST 45*  ALT 30  ALKPHOS 53  BILITOT 0.9  ALBUMIN 3.0*    Cardiac Enzymes No results for input(s): TROPONINI, PROBNP in the last 168 hours.  Glucose  Recent Labs Lab 10/08/16 0334 10/08/16 0848 10/08/16 1211 10/08/16 1627 10/08/16 2013 10/09/16 0005  GLUCAP 191* 207* 191* 166* 193* 190*    Imaging Dg Chest Port 1 View  Result Date: 10/09/2016 CLINICAL DATA:  Respiratory failure EXAM: PORTABLE CHEST 1 VIEW COMPARISON:  October 08, 2016 FINDINGS: The feeding tube terminates below today's film. Other support apparatus stable. No pneumothorax. Stable cardiomegaly. Support apparatus obscures the right base. Probable mild venous congestion with no definitive focal infiltrate. IMPRESSION: Stable support apparatus. Probable mild  pulmonary venous congestion. Electronically Signed   By: Dorise Bullion III M.D   On: 10/09/2016 07:16   Dg Abd Portable 1v  Result Date: 10/08/2016 CLINICAL DATA:  Evaluate enteric tube placement. EXAM: PORTABLE ABDOMEN - 1 VIEW COMPARISON:  Common radiograph 10/07/2016. FINDINGS: Enteric tube tip projects over the gastric antrum. Unremarkable bowel gas pattern. Lumbar spine degenerative changes. IMPRESSION: Enteric tube tip projects over the gastric antrum. Electronically Signed   By: Lovey Newcomer M.D.   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, aneurysmal dilation of Lt common iliac, atherosclerosis Echo 11/06 >> EF 40 to 45%, grade 1 DD, PAS 51 mmHg MRI brain 11/07 >> acute infarct Lt MCA with hemorrhagic conversion, cytotoxic edema  CULTURES: Blood 11/03 >> Urine 11/03 >>   ANTIBIOTICS: Vancomycin 11/03 >> 11/03 Zosyn 11/03 >> 11/03   SIGNIFICANT EVENTS: 11/03 Admit, neuro consulted 11/04 Start 3% NS 11/05 Extubated >> reintubated  LINES/TUBES: ETT 11/03 >> 11/05 ETT 11/05 >>  Lt PICC 11/05 >>   DISCUSSION: 75 yo female former smoker with altered mental status, HTN emergency, Rt sided weakness, compromised airway from Lt MCA infarct.  Also found to have non displaced Rt pubic rami fracture.  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 Hx of COPD/asthma Failed extubation attempt 11/05, continue tracheostomy management - continue pressure support - tracheostomy care - f/u CXR - scheduled pulmicort, duoneb  Hypernatremia > improving - continue tube feedings - continue free water  Pubic rami fracture - per orthopedics can weight bear as tolerated with walker  DM type II - SSI with lantus  Nutrition Hx of GERD - continue tube feeding  CKD 2. - monitor renal fx, urine outpt  DVT prophylaxis - SQ heparin, SCDs SUP - Pepcid Goals of care - Full code  CC time 31 minutes.  Roselie Awkward, MD [facility] Pager: [phone] Cell: [phone] After 3pm or if no response, call [phone]

## 2016-10-09 NOTE — Progress Notes (Signed)
Patient returned to full support with previous settings due to increased RR. Vital signs stable at this time. RN aware. RT will continue to monitor.

## 2016-10-10 ENCOUNTER — Inpatient Hospital Stay (HOSPITAL_COMMUNITY): Payer: Medicare Other

## 2016-10-10 LAB — CBC WITH DIFFERENTIAL/PLATELET
BASOS ABS: 0 10*3/uL (ref 0.0–0.1)
BASOS PCT: 0 %
Eosinophils Absolute: 0.1 10*3/uL (ref 0.0–0.7)
Eosinophils Relative: 1 %
HEMATOCRIT: 38.5 % (ref 36.0–46.0)
HEMOGLOBIN: 11.9 g/dL — AB (ref 12.0–15.0)
Lymphocytes Relative: 10 %
Lymphs Abs: 1 10*3/uL (ref 0.7–4.0)
MCH: 30.5 pg (ref 26.0–34.0)
MCHC: 30.9 g/dL (ref 30.0–36.0)
MCV: 98.7 fL (ref 78.0–100.0)
Monocytes Absolute: 1.2 10*3/uL — ABNORMAL HIGH (ref 0.1–1.0)
Monocytes Relative: 12 %
NEUTROS ABS: 7.8 10*3/uL — AB (ref 1.7–7.7)
NEUTROS PCT: 78 %
Platelets: 144 10*3/uL — ABNORMAL LOW (ref 150–400)
RBC: 3.9 MIL/uL (ref 3.87–5.11)
RDW: 15.7 % — ABNORMAL HIGH (ref 11.5–15.5)
WBC: 10 10*3/uL (ref 4.0–10.5)

## 2016-10-10 LAB — BASIC METABOLIC PANEL
ANION GAP: 7 (ref 5–15)
BUN: 58 mg/dL — ABNORMAL HIGH (ref 6–20)
CALCIUM: 9.4 mg/dL (ref 8.9–10.3)
CO2: 28 mmol/L (ref 22–32)
Chloride: 116 mmol/L — ABNORMAL HIGH (ref 101–111)
Creatinine, Ser: 1.45 mg/dL — ABNORMAL HIGH (ref 0.44–1.00)
GFR, EST AFRICAN AMERICAN: 39 mL/min — AB (ref >60)
GFR, EST NON AFRICAN AMERICAN: 34 mL/min — AB (ref >60)
Glucose, Bld: 273 mg/dL — ABNORMAL HIGH (ref 65–99)
Potassium: 3.3 mmol/L — ABNORMAL LOW (ref 3.5–5.1)
Sodium: 151 mmol/L — ABNORMAL HIGH (ref 135–145)

## 2016-10-10 LAB — GLUCOSE, CAPILLARY
GLUCOSE-CAPILLARY: 207 mg/dL — AB (ref 65–99)
GLUCOSE-CAPILLARY: 211 mg/dL — AB (ref 65–99)
GLUCOSE-CAPILLARY: 228 mg/dL — AB (ref 65–99)
GLUCOSE-CAPILLARY: 235 mg/dL — AB (ref 65–99)
GLUCOSE-CAPILLARY: 237 mg/dL — AB (ref 65–99)
GLUCOSE-CAPILLARY: 269 mg/dL — AB (ref 65–99)
Glucose-Capillary: 166 mg/dL — ABNORMAL HIGH (ref 65–99)
Glucose-Capillary: 214 mg/dL — ABNORMAL HIGH (ref 65–99)
Glucose-Capillary: 187 mg/dL — ABNORMAL HIGH (ref 65–99)

## 2016-10-10 MED ORDER — PRO-STAT SUGAR FREE PO LIQD
30.0000 mL | Freq: Every day | ORAL | Status: DC
  Administered 2016-10-11 – 2016-10-13 (×3): 30 mL
  Filled 2016-10-10 (×4): qty 30

## 2016-10-10 MED ORDER — HYDRALAZINE HCL 50 MG PO TABS
100.0000 mg | ORAL_TABLET | Freq: Three times a day (TID) | ORAL | Status: DC
  Administered 2016-10-10 – 2016-10-20 (×30): 100 mg
  Filled 2016-10-10 (×33): qty 2

## 2016-10-10 MED ORDER — INSULIN GLARGINE 100 UNIT/ML ~~LOC~~ SOLN
22.0000 [IU] | Freq: Every day | SUBCUTANEOUS | Status: DC
  Administered 2016-10-10 – 2016-10-13 (×4): 22 [IU] via SUBCUTANEOUS
  Filled 2016-10-10 (×7): qty 0.22

## 2016-10-10 MED ORDER — FUROSEMIDE 10 MG/ML IJ SOLN
40.0000 mg | Freq: Once | INTRAMUSCULAR | Status: AC
  Administered 2016-10-10: 40 mg via INTRAVENOUS
  Filled 2016-10-10: qty 4

## 2016-10-10 MED ORDER — FREE WATER
200.0000 mL | Status: DC
  Administered 2016-10-10 – 2016-10-11 (×4): 200 mL

## 2016-10-10 MED ORDER — GLUCERNA 1.2 CAL PO LIQD
1000.0000 mL | ORAL | Status: DC
  Administered 2016-10-10 – 2016-10-11 (×2): 1000 mL
  Filled 2016-10-10 (×7): qty 1000

## 2016-10-10 MED ORDER — POTASSIUM CHLORIDE 20 MEQ/15ML (10%) PO SOLN
40.0000 meq | ORAL | Status: AC
  Filled 2016-10-10: qty 30

## 2016-10-10 MED ORDER — DEXTROSE 5 % IV SOLN
INTRAVENOUS | Status: DC
  Administered 2016-10-10 – 2016-10-12 (×2): via INTRAVENOUS
  Administered 2016-10-12 – 2016-10-13 (×2): 75 mL via INTRAVENOUS

## 2016-10-10 MED ORDER — POTASSIUM CHLORIDE 20 MEQ/15ML (10%) PO SOLN
40.0000 meq | Freq: Once | ORAL | Status: AC
  Administered 2016-10-10: 40 meq
  Filled 2016-10-10: qty 30

## 2016-10-10 NOTE — Progress Notes (Signed)
eLink Physician-Brief Progress Note Patient Name: [name] DOB: [birth year] MRN: [id number]   Date of Service  10/10/2016  HPI/Events of Note  Request to renew bilateral wrist restraints.   eICU Interventions  Will renew bilateral wrist restraints.      Intervention Category Minor Interventions: Agitation / anxiety - evaluation and management  Sommer,Steven Eugene 10/10/2016, 8:50 PM

## 2016-10-10 NOTE — Care Management Note (Signed)
Case Management Note Original note created by Ellan Lambert 10/06/16  Patient Details  Name: CHENIYA RYBKA MRN: [id number] Date of Birth: February 23, 1941  Subjective/Objective:  Pt admitted on 09/30/16 s/p Lt MCA infarct.  PTA, pt independent, lives with family members.                    Action/Plan: Pt remains intubated currently.  Planning possible tracheostomy on Friday, 11/10.  Will follow progress.    Expected Discharge Date:                  Expected Discharge Plan:  [place]  In-House Referral:     Discharge planning Services  CM Consult  Post Acute Care Choice:    Choice offered to:     DME Arranged:    DME Agency:     HH Arranged:    [place] Agency:     Status of Service:  In process, will continue to follow  If discussed at Long Length of Stay Meetings, dates discussed:    Additional Comments: 10/10/2016 Pt transferred to 2M over weekend.  Pt trached 10/07/16 - remains on ventilator 30%, sedation discontinued earlier this am, tube feeds on hold awaiting access - may need PEG at some point.  Creatinine rising.  Attending requested LTACH referral - CM called referral in to physician advisor to determine appropriateness.  PT recommending CIR; however pt is not currently participating in therapies

## 2016-10-10 NOTE — Progress Notes (Addendum)
Nutrition Follow-up  DOCUMENTATION CODES:   Not applicable  INTERVENTION:    Continue Glucerna 1.2 via OGT, increase goal rate to 55 ml/h (1320 ml) with Prostat 30 ml once daily to provide 1684 kcal, 94 gm protein, 1063 ml free water daily.  NUTRITION DIAGNOSIS:   Inadequate oral intake related to inability to eat as evidenced by NPO status.  Ongoing  GOAL:   Patient will meet greater than or equal to 90% of their needs  Met  MONITOR:   TF tolerance, Vent status, Labs  ASSESSMENT:   75 y/o female w/ extensive PMHx including Anemia, Anxiety, CAD, COPD, CAD, HTN, DM2, MI, GERD, Hiatal Hernia, Cirrhosis. Noted by family to be making gurgling noises and not moving right side. CT showed acute L MCA infarct and fx of Rt superior and inferior pubic rami.   Patient remains intubated on ventilator support. Failed trach collar trials this morning.  MV: 10.3 L/min Temp (24hrs), Avg:99.5 F (37.5 C), Min:98.3 F (36.8 C), Max:100.3 F (37.9 C)  S/P tracheostomy 11/10. Cortrak tube out 11/12. NGT was placed for feedings.  Patient is currently receiving Glucerna 1.2 via NGT at 45 ml/h (1080 ml/day) with Prostat 30 ml TID to provide 1596 kcal, 109 grams protein, and 869 ml free water daily. Tolerating TF well. Free water ordered this morning: 200 ml every 4 hours. Labs reviewed: sodium elevated at 151, potassium low at 3.3. CBG's: 227-155 Medications reviewed and include colace, lasix, KCl.  Diet Order:   NPO  Skin:  Reviewed, no issues  Last BM:  11/9  Height:   Ht Readings from Last 1 Encounters:  09/30/16 5' 7"  (1.702 m)    Weight:   Wt Readings from Last 1 Encounters:  10/10/16 174 lb 9.7 oz (79.2 kg)    Ideal Body Weight:  61.36 kg  BMI:  Body mass index is 27.35 kg/m.  Estimated Nutritional Needs:   Kcal:  1703  Protein:  90-100 gm  Fluid:  >/= 1.9 L  EDUCATION NEEDS:   No education needs identified at this time  Molli Barrows, [place], [place],  [place] Pager [phone] After Hours Pager [phone]

## 2016-10-10 NOTE — Care Management Important Message (Signed)
Important Message  Patient Details  Name: [name] MRN: [id number] Date of Birth: Nov 26, 1941   Medicare Important Message Given:  Other (see comment)    [place], Gonzalo Waymire [place] 10/10/2016, 10:20 AM

## 2016-10-10 NOTE — Progress Notes (Signed)
PT was trialed on trach collar. PT failed and was placed back on ventilator

## 2016-10-10 NOTE — Care Management Note (Signed)
Case Management Note Original note created by Ellan Lambert 10/06/16  Patient Details  Name: RYENNE CLIFFORD MRN: [id number] Date of Birth: Aug 18, 1941  Subjective/Objective:  Pt admitted on 09/30/16 s/p Lt MCA infarct.  PTA, pt independent, lives with family members.                    Action/Plan: Pt remains intubated currently.  Planning possible tracheostomy on Friday, 11/10.  Will follow progress.    Expected Discharge Date:                  Expected Discharge Plan:  [place]  In-House Referral:     Discharge planning Services  CM Consult  Post Acute Care Choice:    Choice offered to:     DME Arranged:    DME Agency:     HH Arranged:    [place] Agency:     Status of Service:  In process, will continue to follow  If discussed at Long Length of Stay Meetings, dates discussed:    Additional Comments: 10/10/2016 Pt transferred to 90M over weekend.  Pt trached 10/07/16 - remains on ventilator 30%, sedation discontinued earlier this am, tube feeds on hold awaiting access - may need PEG at some point.  Creatinine rising.  Attending requested LTACH referral - CM called referral in to physician advisor to determine appropriateness.

## 2016-10-10 NOTE — Progress Notes (Addendum)
PULMONARY / CRITICAL CARE MEDICINE   Name: [name] MRN: [id number] DOB: 09-20-1941    ADMISSION DATE:  09/30/2016  REFERRING MD:  Dr. Cletus Gash, ER  CHIEF COMPLAINT:  Rt sided weakness.  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  SUBJECTIVE:   Afebrile  Still on hypertensive side No acute events overnight  Pulled FT out yesterday   VITAL SIGNS: BP (!) 168/78   Pulse 77   Temp 99.6 F (37.6 C) (Oral)   Resp (!) 22   Ht 5\' 7"  (1.702 m)   Wt 174 lb 9.7 oz (79.2 kg)   SpO2 94%   BMI 27.35 kg/m   VENTILATOR SETTINGS: Vent Mode: PRVC FiO2 (%):  [30 %-40 %] 30 % Set Rate:  [16 bmp] 16 bmp Vt Set:  [500 mL] 500 mL PEEP:  [5 cmH20] 5 cmH20 Pressure Support:  [15 cmH20] 15 cmH20 Plateau Pressure:  [21 cmH20-23 cmH20] 23 cmH20  INTAKE / OUTPUT:  Intake/Output Summary (Last 24 hours) at 10/10/16 0833 Last data filed at 10/10/16 0600  Gross per 24 hour  Intake           1525.6 ml  Output             6720 ml  Net          -5194.4 ml    PHYSICAL EXAMINATION: General: resting comfortably. No distress. Awake on exam Neuro: opens eyes, right sided hemiplegia, seems to neglect the right. Has what appears to be expressive and receptive aphasia  HEENT: tracheostomy in place, right sided facial droop  Cardiovascular:  RRR, no mgr Lungs:  Scattered rhonchi, vent supported breaths, no accessory use  Abdomen:  Soft, BS+, soft, non tender Skin:  Chronic venous stasis changes  LABS:  BMET  Recent Labs Lab 10/08/16 0342 10/09/16 0430 10/10/16 0320  NA 151* 149* 151*  K 3.8 3.5 3.3*  CL 120* 118* 116*  CO2 24 24 28   BUN 54* 58* 58*  CREATININE 1.22* 1.38* 1.45*  GLUCOSE 210* 192* 273*     Electrolytes  Recent Labs Lab 10/03/16 1641  10/06/16 0430  10/08/16 0342 10/09/16 0430 10/10/16 0320  CALCIUM  --   < > 9.8  < > 9.0 8.7* 9.4  MG 2.2  --  2.4  --   --   --   --   PHOS 2.1*  --  2.9  --   --   --   --   < > = values in this interval not displayed.  CBC  Recent Labs Lab 10/08/16 0342 10/09/16 0430 10/10/16 0320  WBC 6.6 8.1 10.0  HGB 8.3* 7.7* 11.9*  HCT 27.0* 25.1* 38.5  PLT 161 164 144*    Coag's  Recent Labs Lab 10/06/16 0900  APTT 33  INR 1.11    Sepsis Markers No results for input(s): LATICACIDVEN, PROCALCITON, O2SATVEN in the last 168 hours.  ABG No results for input(s): PHART, PCO2ART, PO2ART in the last 168 hours.  Liver Enzymes No results for input(s): AST, ALT, ALKPHOS, BILITOT, ALBUMIN in the last 168 hours.  Cardiac Enzymes No results for input(s): TROPONINI, PROBNP in the last 168 hours.  Glucose  Recent Labs Lab 10/08/16 1211 10/08/16 1627 10/08/16 2013  10/09/16 0005 10/09/16 1244 10/09/16 1924  GLUCAP 191* 166* 193* 190* 227* 155*    Imaging Dg Chest Port 1 View  Result Date: 10/10/2016 CLINICAL DATA:  Acute respiratory failure with hypoxemia EXAM: PORTABLE CHEST 1 VIEW COMPARISON:  Yesterday FINDINGS: Nasogastric in place of feeding tube, at least reaches the stomach. Left upper extremity PICC with tip at the SVC level. Tracheostomy tube remains seated. COPD with chronic hyperinflation and apical lucency. Improving basilar aeration with mild residual pneumonia or atelectasis. Chronic cardiopericardial enlargement. No edema, effusion, or pneumothorax. IMPRESSION: 1. Unremarkable positioning of tubes and central line. 2. Improving basilar aeration. Electronically Signed   By: Monte Fantasia M.D.   On: 10/10/2016 07:13  PCXR persistent bibasilar atx. 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, aneurysmal dilation of Lt common iliac, atherosclerosis Echo 11/06 >> EF 40 to 45%, grade 1 DD, PAS 51 mmHg MRI brain 11/07 >> acute infarct Lt MCA with hemorrhagic conversion, cytotoxic edema  CULTURES: Blood 11/03 >>neg Urine 11/03 >> neg  ANTIBIOTICS: Vancomycin 11/03 >> 11/03 Zosyn 11/03 >> 11/03   SIGNIFICANT EVENTS: 11/03 Admit, neuro consulted 11/04 Start 3% NS 11/05 Extubated >> reintubated trach  LINES/TUBES: ETT 11/03 >> 11/05 ETT 11/05 >>  Lt PICC 11/05 >>   DISCUSSION: 76 yo female former smoker with altered mental status, HTN emergency, Rt sided weakness, compromised airway from Lt MCA infarct.  Also found to have non displaced Rt pubic rami fracture. She is now s/p hypertonic saline protocol, trach now placed and working towards rehab focused efforts. For today goal: cont to correct water def, push lasix, adjust antihypertensives, replace FT and get PT involved. Will also ask CM to eval for LTAC.   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 Hx of COPD/asthma Failed extubation attempt 11/05, continue tracheostomy management - continue pressure support and ATC as tolerated - get OOB - tracheostomy care - f/u CXR PRN - scheduled pulmicort, duoneb  Hypernatremia > improving - continue tube feedings - dc NS; adjust free water replacement   Pubic rami fracture - per  orthopedics can weight bear as tolerated with walker  DM type II - SSI with lantus  Nutrition Hx of GERD - continue tube feeding  CKD 2. - monitor renal fx, urine outpt  Physical deconditioning  - PT consult   DVT prophylaxis - SQ heparin, SCDs SUP - Pepcid Goals of care - Full code   30 minutes PCCM time  Erick Colace ACNP-BC [place] Pager # [phone] OR # [phone] if no answer  STAFF NOTE: I, Merrie Roof, MD FACP have personally reviewed patient's available data, including medical history, events of note, physical examination and test results as part of my evaluation. I have discussed with resident/NP and other care providers such  as pharmacist, RN and RRT. In addition, I personally evaluated patient and elicited key findings of: awakens, agitation, neg 5 liters, reduce or hold lasix with crt noted, allow NA to some extent, weaning TC fail, has dyschony, PS attempts, may need to tolerate dyschrony appearance if abg okay, goal PS 5 for 6 hours, may need higher PS, ltach consult,. To sdu vent bed, if any more CT head would add neck to thyroid goiter, I wil lplace NGT and feed, HTn, increase hydralazine The patient is critically ill with multiple organ systems failure and requires high complexity decision making for assessment and support, frequent evaluation and titration of therapies, application of advanced monitoring technologies and extensive interpretation of multiple databases.   Critical Care Time devoted to patient care services described in this note is 30 Minutes. This time reflects time of care of this signee: Merrie Roof, MD FACP. This critical care time does not reflect procedure time, or teaching time or supervisory time of PA/NP/Med student/Med Resident etc but could involve care discussion time. Rest per NP/medical resident whose note is outlined above and that I agree with   Lavon Paganini. Titus Mould, MD, [place] Pgr: [place]  [facility] 10/10/2016 9:41 AM

## 2016-10-10 NOTE — Progress Notes (Signed)
[facility] Progress Note Patient Name: [name] DOB: January 27, 1941 MRN: [id number]   Date of Service  10/10/2016  HPI/Events of Note  Low K, renal failure  eICU Interventions  Replace K cautiously     Intervention Category Intermediate Interventions: Electrolyte abnormality - evaluation and management  [place] 10/10/2016, 4:11 AM

## 2016-10-10 NOTE — Progress Notes (Signed)
Physical Therapy Treatment Patient Details Name: [name] MRN: [id number] DOB: [birth year] Today's Date: 10/10/2016    History of Present Illness Patient is a 75 y/o female with hx of DM, PVD, PAD, MI, HTN, gout, COPD, CAD, lower limb ischemia presents with right sided weakness. 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 Found to have Nondisplaced fracture of the right superior and inferior pubic rami. 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     PT Comments    Pt lethargic today with lack of response to questions or minimal head nods, drifts back to sleep easily in supine. Pt with poor sitting balance and not assisting with mobility today but noted to move legs in bed spontaneously. Will continue to follow to maximize balance, mobility and function.  Pt on vent PRVC 30% BP 147/61 HR 80   Follow Up Recommendations  CIR     Equipment Recommendations       Recommendations for Other Services       Precautions / Restrictions Precautions Precautions: Fall Precaution Comments: wrist restraints; vent    Mobility  Bed Mobility Overal bed mobility: Needs Assistance;+2 for physical assistance Bed Mobility: Rolling;Supine to Sit;Sit to Supine Rolling: Max assist   Supine to sit: Total assist;+2 for safety/equipment;+2 for physical assistance;HOB elevated Sit to supine: Total assist;+2 for physical assistance;+2 for safety/equipment   General bed mobility comments: pt limited assistance to help turn in bed, pt not assisting with transition to or from EOB with assist to elevate trunk as well as bring legs off and back onto bed. Total assist +2 to scoot to [facility] Rolling bil x 2 for pericare  Transfers                 General transfer comment: unable to attempt  Ambulation/Gait                 Stairs             Wheelchair Mobility    Modified Rankin (Stroke Patients Only)       Balance Overall balance assessment: Needs assistance Sitting-balance support: Feet supported;Bilateral upper extremity supported Sitting balance-Leahy Scale: Zero Sitting balance - Comments: pt initially max assist for sitting balance with progression to min assist with use of LUE grasping foot rail. Without hand on rail max assist with inability to correct trunk position to midline or extend trunk. Pt unable to prop on elbow bil UE with max assist and cues. Pt with posterior right lean without use of assist or rail. Pt not assisting with scooting or positioning today Postural control: Right lateral lean;Posterior lean                          Cognition Arousal/Alertness: Lethargic Behavior During Therapy: Flat affect Overall Cognitive Status: Difficult to assess                 General Comments: pt maintaining left gaze will not come to or cross midline. nodding yes or no response to all questions    Exercises      General Comments        Pertinent Vitals/Pain Pain Assessment:  (CPOT= 0)    Home Living                      Prior Function  PT Goals (current goals can now be found in the care plan section) Progress towards PT goals: Not progressing toward goals - comment (pt lethargic with limited ability to participate or follow commands today on vent)    Frequency    Min 3X/week      PT Plan Frequency needs to be updated;Current plan remains appropriate (if pt able to progress activity )    Co-evaluation             End of Session   Activity Tolerance: Patient tolerated treatment well Patient left: in bed;with call bell/phone within reach;with bed alarm set     Time: [id number] PT Time Calculation (min) (ACUTE ONLY): 25 min  Charges:  $Therapeutic Activity: 23-37 mins                    G CodesMelford Aase 10/18/2016,  12:12 PM  Elwyn Reach, [place]

## 2016-10-11 ENCOUNTER — Inpatient Hospital Stay (HOSPITAL_COMMUNITY): Payer: Medicare Other

## 2016-10-11 DIAGNOSIS — I2510 Atherosclerotic heart disease of native coronary artery without angina pectoris: Secondary | ICD-10-CM

## 2016-10-11 DIAGNOSIS — K5790 Diverticulosis of intestine, part unspecified, without perforation or abscess without bleeding: Secondary | ICD-10-CM

## 2016-10-11 DIAGNOSIS — Z930 Tracheostomy status: Secondary | ICD-10-CM

## 2016-10-11 DIAGNOSIS — S32591S Other specified fracture of right pubis, sequela: Secondary | ICD-10-CM

## 2016-10-11 DIAGNOSIS — I5042 Chronic combined systolic (congestive) and diastolic (congestive) heart failure: Secondary | ICD-10-CM

## 2016-10-11 DIAGNOSIS — E1159 Type 2 diabetes mellitus with other circulatory complications: Secondary | ICD-10-CM

## 2016-10-11 DIAGNOSIS — J449 Chronic obstructive pulmonary disease, unspecified: Secondary | ICD-10-CM

## 2016-10-11 DIAGNOSIS — I619 Nontraumatic intracerebral hemorrhage, unspecified: Secondary | ICD-10-CM

## 2016-10-11 DIAGNOSIS — E119 Type 2 diabetes mellitus without complications: Secondary | ICD-10-CM

## 2016-10-11 DIAGNOSIS — D62 Acute posthemorrhagic anemia: Secondary | ICD-10-CM

## 2016-10-11 DIAGNOSIS — I10 Essential (primary) hypertension: Secondary | ICD-10-CM

## 2016-10-11 DIAGNOSIS — N189 Chronic kidney disease, unspecified: Secondary | ICD-10-CM

## 2016-10-11 DIAGNOSIS — J9601 Acute respiratory failure with hypoxia: Secondary | ICD-10-CM

## 2016-10-11 DIAGNOSIS — E46 Unspecified protein-calorie malnutrition: Secondary | ICD-10-CM

## 2016-10-11 DIAGNOSIS — I63512 Cerebral infarction due to unspecified occlusion or stenosis of left middle cerebral artery: Secondary | ICD-10-CM | POA: Diagnosis present

## 2016-10-11 DIAGNOSIS — N289 Disorder of kidney and ureter, unspecified: Secondary | ICD-10-CM

## 2016-10-11 DIAGNOSIS — E870 Hyperosmolality and hypernatremia: Secondary | ICD-10-CM

## 2016-10-11 DIAGNOSIS — E8809 Other disorders of plasma-protein metabolism, not elsewhere classified: Secondary | ICD-10-CM

## 2016-10-11 DIAGNOSIS — R0682 Tachypnea, not elsewhere classified: Secondary | ICD-10-CM

## 2016-10-11 DIAGNOSIS — J439 Emphysema, unspecified: Secondary | ICD-10-CM

## 2016-10-11 DIAGNOSIS — Z9911 Dependence on respirator [ventilator] status: Secondary | ICD-10-CM

## 2016-10-11 DIAGNOSIS — D72820 Lymphocytosis (symptomatic): Secondary | ICD-10-CM

## 2016-10-11 DIAGNOSIS — E876 Hypokalemia: Secondary | ICD-10-CM

## 2016-10-11 DIAGNOSIS — D539 Nutritional anemia, unspecified: Secondary | ICD-10-CM

## 2016-10-11 DIAGNOSIS — E782 Mixed hyperlipidemia: Secondary | ICD-10-CM

## 2016-10-11 DIAGNOSIS — I63412 Cerebral infarction due to embolism of left middle cerebral artery: Secondary | ICD-10-CM

## 2016-10-11 LAB — GLUCOSE, CAPILLARY
GLUCOSE-CAPILLARY: 161 mg/dL — AB (ref 65–99)
GLUCOSE-CAPILLARY: 181 mg/dL — AB (ref 65–99)
GLUCOSE-CAPILLARY: 210 mg/dL — AB (ref 65–99)
GLUCOSE-CAPILLARY: 220 mg/dL — AB (ref 65–99)
GLUCOSE-CAPILLARY: 240 mg/dL — AB (ref 65–99)
GLUCOSE-CAPILLARY: 251 mg/dL — AB (ref 65–99)
Glucose-Capillary: 262 mg/dL — ABNORMAL HIGH (ref 65–99)

## 2016-10-11 LAB — CBC
HEMATOCRIT: 29.7 % — AB (ref 36.0–46.0)
HEMOGLOBIN: 9 g/dL — AB (ref 12.0–15.0)
MCH: 30.4 pg (ref 26.0–34.0)
MCHC: 30.3 g/dL (ref 30.0–36.0)
MCV: 100.3 fL — ABNORMAL HIGH (ref 78.0–100.0)
Platelets: 187 10*3/uL (ref 150–400)
RBC: 2.96 MIL/uL — AB (ref 3.87–5.11)
RDW: 16.2 % — ABNORMAL HIGH (ref 11.5–15.5)
WBC: 15 10*3/uL — ABNORMAL HIGH (ref 4.0–10.5)

## 2016-10-11 LAB — BASIC METABOLIC PANEL
ANION GAP: 7 (ref 5–15)
BUN: 51 mg/dL — ABNORMAL HIGH (ref 6–20)
CALCIUM: 9.3 mg/dL (ref 8.9–10.3)
CO2: 28 mmol/L (ref 22–32)
Chloride: 113 mmol/L — ABNORMAL HIGH (ref 101–111)
Creatinine, Ser: 1.33 mg/dL — ABNORMAL HIGH (ref 0.44–1.00)
GFR calc Af Amer: 44 mL/min — ABNORMAL LOW (ref >60)
GFR calc non Af Amer: 38 mL/min — ABNORMAL LOW (ref >60)
GLUCOSE: 236 mg/dL — AB (ref 65–99)
Potassium: 3.8 mmol/L (ref 3.5–5.1)
Sodium: 148 mmol/L — ABNORMAL HIGH (ref 135–145)

## 2016-10-11 LAB — COMPREHENSIVE METABOLIC PANEL
ALK PHOS: 112 U/L (ref 38–126)
ALT: 55 U/L — AB (ref 14–54)
ANION GAP: 9 (ref 5–15)
AST: 41 U/L (ref 15–41)
Albumin: 2.3 g/dL — ABNORMAL LOW (ref 3.5–5.0)
BUN: 48 mg/dL — AB (ref 6–20)
CHLORIDE: 117 mmol/L — AB (ref 101–111)
CO2: 26 mmol/L (ref 22–32)
Calcium: 9.6 mg/dL (ref 8.9–10.3)
Creatinine, Ser: 1.37 mg/dL — ABNORMAL HIGH (ref 0.44–1.00)
GFR calc non Af Amer: 36 mL/min — ABNORMAL LOW (ref >60)
GFR, EST AFRICAN AMERICAN: 42 mL/min — AB (ref >60)
Glucose, Bld: 248 mg/dL — ABNORMAL HIGH (ref 65–99)
POTASSIUM: 3.5 mmol/L (ref 3.5–5.1)
SODIUM: 152 mmol/L — AB (ref 135–145)
TOTAL PROTEIN: 6.1 g/dL — AB (ref 6.5–8.1)
Total Bilirubin: 0.7 mg/dL (ref 0.3–1.2)

## 2016-10-11 MED ORDER — CARVEDILOL 12.5 MG PO TABS
12.5000 mg | ORAL_TABLET | Freq: Two times a day (BID) | ORAL | Status: DC
  Administered 2016-10-11 – 2016-10-17 (×12): 12.5 mg via ORAL
  Filled 2016-10-11 (×12): qty 1

## 2016-10-11 MED ORDER — POTASSIUM CHLORIDE 20 MEQ/15ML (10%) PO SOLN
20.0000 meq | ORAL | Status: AC
  Administered 2016-10-11: 20 meq
  Filled 2016-10-11: qty 15

## 2016-10-11 MED ORDER — CARVEDILOL 25 MG PO TABS: 25.0000 mg | ORAL_TABLET | Freq: Two times a day (BID) | ORAL | Status: DC

## 2016-10-11 MED ORDER — ONDANSETRON HCL 4 MG/2ML IJ SOLN
4.0000 mg | Freq: Once | INTRAMUSCULAR | Status: AC
  Administered 2016-10-11: 4 mg via INTRAVENOUS
  Filled 2016-10-11: qty 2

## 2016-10-11 MED ORDER — INSULIN ASPART 100 UNIT/ML ~~LOC~~ SOLN
4.0000 [IU] | SUBCUTANEOUS | Status: DC
  Administered 2016-10-11 – 2016-10-20 (×50): 4 [IU] via SUBCUTANEOUS

## 2016-10-11 NOTE — Progress Notes (Signed)
PULMONARY / CRITICAL CARE MEDICINE   Name: [name] MRN: [id number] DOB: 11-07-1941    ADMISSION DATE:  09/30/2016  REFERRING MD:  Dr. Cletus Gash, ER  CHIEF COMPLAINT:  Rt sided weakness.  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   CULTURES: Blood 11/03 >>neg Urine 11/03 >> neg  ANTIBIOTICS: Vancomycin 11/03 >> 11/03 Zosyn 11/03 >> 11/03   SIGNIFICANT EVENTS: 11/03 Admit, neuro consulted 11/04 Start 3% NS 11/05 Extubated >> reintubated trach  LINES/TUBES: ETT 11/03 >> 11/05 ETT 11/05 >>  Lt PICC 11/05 >>   SUBJECTIVE:   Uncomfortable during trach cholar. Nods to pain but not specific where she hurts. She is on two point restraint.   VITAL SIGNS: BP (!) 182/101   Pulse 81   Temp 99 F (37.2 C) (Oral)   Resp 20   Ht 5\' 7"  (1.702 m)   Wt 179 lb 0.2 oz (81.2 kg)   SpO2 98%   BMI 28.04 kg/m   VENTILATOR SETTINGS: Vent Mode: PRVC FiO2 (%):  [30 %-40 %] 30 % Set Rate:  [16 bmp] 16 bmp Vt Set:  [500 mL] 500 mL PEEP:  [5 cmH20] 5 cmH20 Pressure Support:  [20 cmH20] 20 cmH20 Plateau Pressure:  [20 cmH20-22 cmH20] 20 cmH20  INTAKE  / OUTPUT:  Intake/Output Summary (Last 24 hours) at 10/11/16 0742 Last data filed at 10/11/16 0600  Gross per 24 hour  Intake          1351.42 ml  Output             2825 ml  Net         -1473.58 ml    PHYSICAL EXAMINATION: General: appears uncomfortable.  Neuro: awake, opens eyes, intermittently follows command, appears to be expressive and receptive aphasia,  HEENT: tracheostomy in place, right sided facial droop  Cardiovascular:  RRR, no mgr Lungs:  On trach collar, scattered rhonchi, no accessory use  Abdomen:  Soft, BS+, soft, non tender Skin:  Chronic venous stasis changes  LABS:  BMET  Recent Labs Lab 10/09/16 0430 10/10/16 0320 10/11/16 0340  NA 149* 151* 152*  K 3.5 3.3* 3.5  CL 118* 116* 117*  CO2 24 28 26   BUN 58* 58* 48*  CREATININE 1.38* 1.45* 1.37*  GLUCOSE 192* 273* 248*    Electrolytes  Recent Labs Lab 10/06/16 0430  10/09/16 0430 10/10/16 0320 10/11/16 0340  CALCIUM 9.8  < > 8.7* 9.4 9.6  MG 2.4  --   --   --   --   PHOS 2.9  --   --   --   --   < > = values in this interval not displayed.  CBC  Recent Labs Lab  10/09/16 0430 10/10/16 0320 10/11/16 0340  WBC 8.1 10.0 15.0*  HGB 7.7* 11.9* 9.0*  HCT 25.1* 38.5 29.7*  PLT 164 144* 187    Coag's  Recent Labs Lab 10/06/16 0900  APTT 33  INR 1.11    Sepsis Markers No results for input(s): LATICACIDVEN, PROCALCITON, O2SATVEN in the last 168 hours.  ABG No results for input(s): PHART, PCO2ART, PO2ART in the last 168 hours.  Liver Enzymes  Recent Labs Lab 10/11/16 0340  AST 41  ALT 55*  ALKPHOS 112  BILITOT 0.7  ALBUMIN 2.3*    Cardiac Enzymes No results for input(s): TROPONINI, PROBNP in the last 168 hours.  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 Hx of COPD/asthma Failed extubation attempt 11/05, continue tracheostomy management P: - Continue trach collar  - OOB - tracheostomy care - f/u CXR as failed TC - scheduled pulmicort, duoneb  GI: A: Dysphagia secondary to CVA P: -SLP eval again -Will likely need PEG will order -TF per Cortrak 55+30 cc/hr -Pepcid  Renal: A: Hypernatremia > improving AoCKD 2: improving P: - free water 200cc q4h, dc failed -add d5w - monitor renal fx, urine outpt  MSK: Pubic rami fracture -Per orthopedics can weight bear as tolerated with walker -PT  Endo A: DM type II CBG in mid 200's P: - SSI with lantus -4 units q4h for TF coverage  Hem: A: Anemia P: DVT prophylaxis - SQ heparin, SCDs CBC every other day  Goals of care - Full code  Wendee Beavers, MD.  10/11/16 8:45 AM PGY-2, [facility]  STAFF NOTE: Linwood Dibbles, MD FACP have personally reviewed patient's available data, including medical history, events of note, physical examination and test results as part of my evaluation. I have discussed with resident/NP and other care providers such as pharmacist, RN and RRT. In addition, I personally evaluated patient and elicited key findings of: awake, int confusion, Tc failed, get pcxr, TC sprints 30 min tid with PS weaning , nocturnal rest, await vent bed, can normalize BP , coreg increase, may need hydral increase, follow hr response, correct Na, failed free water, bmet in pm and in am , needs peg  Lavon Paganini. Titus Mould, MD, [place] Pgr:  [place] [facility] 10/11/2016 10:59 AM

## 2016-10-11 NOTE — Progress Notes (Signed)
Admissions coordinator to follow up with pt. And family tomorrow.    [place] Admissions Coordinator Cell [phone] Office [phone]

## 2016-10-11 NOTE — Progress Notes (Signed)
Inpatient Diabetes Program Recommendations  AACE/ADA: New Consensus Statement on Inpatient Glycemic Control (2015)  Target Ranges:  Prepandial:   less than 140 mg/dL      Peak postprandial:   less than 180 mg/dL (1-2 hours)      Critically ill patients:  140 - 180 mg/dL   Lab Results  Component Value Date   GLUCAP 251 (H) 10/11/2016   HGBA1C 8.1 (H) 10/01/2016   Review of Glycemic Control  Diabetes history: DM 2 Outpatient Diabetes medications: Lantus 22 units Current orders for Inpatient glycemic control: Lantus 22 units, Novolog Resistant Q4hours  Inpatient Diabetes Program Recommendations:   Patient receiving Tube Feeds. Spoke with Dr. Cyndia Skeeters in regards to elevated glucose levels in the mid 200's. Dr. Cyndia Skeeters adding Novolog 4 units Q 4hours Tube Feed Coverage.  Thanks,  Tama Headings RN, MSN, [facility] Inpatient Diabetes Coordinator Team Pager [phone] (8a-5p)

## 2016-10-11 NOTE — Care Management Note (Addendum)
Case Management Note Original note created by Ellan Lambert 10/06/16  Patient Details  Name: [name] MRN: [id number] Date of Birth: [birth year]  Subjective/Objective:  Pt admitted on 09/30/16 s/p Lt MCA infarct.  PTA, pt independent, lives with family members.                    Action/Plan: Pt remains intubated currently.  Planning possible tracheostomy on Friday, 11/10.  Will follow progress.    Expected Discharge Date:                  Expected Discharge Plan:  Long Term Acute Care (LTAC)  In-House Referral:     Discharge planning Services  CM Consult  Post Acute Care Choice:    Choice offered to:     DME Arranged:    DME Agency:     HH Arranged:    HH Agency:     Status of Service:  In process, will continue to follow  If discussed at Long Length of Stay Meetings, dates discussed:    Additional Comments: 10/11/2016  [facility] liason met with sister at bedside.  Sister requested attending conference prior to choosing which facility - attending paged  Discussed in LOS 11/14:  Pt remains appropriate for continued stay and deemed appropriate for [facility] referral.  CM provided referral to both [facility] and [facility].  CM spoke with both brother Collins [place] and sister Helene Kelp and choice for LTACH was given.  Sister immediately chose [facility] due to location - sister is in agreement to meet with [facility] today at 1pm.  CM will inform [facility] of selection.  Pt stays with sister and per sister; pt was completely independent prior to admit   10/10/16 Pt transferred to [facility] over weekend.  Pt trached 10/07/16 - remains on ventilator 30%, sedation discontinued earlier this am, tube feeds on hold awaiting access - may need PEG at some point.  Creatinine rising.  Attending requested LTACH referral - CM called referral in to physician advisor to determine appropriateness.  PT recommending CIR; however pt is not currently participating in therapies

## 2016-10-11 NOTE — Consult Note (Signed)
Physical Medicine and Rehabilitation Consult Reason for Consult: Left MCA infarct with hemorrhagic transformation Referring Physician: Dr. Titus Mould   HPI: [name] is a 75 y.o. right handed female with history of diabetes mellitus, CAD with angioplasty maintained on aspirin, COPD, hypertension, hyperlipidemia. Per chart review patient independent prior to admission living with family members. Presented 09/30/2016 with right-sided weakness. Blood pressure 220 02/27/2012. She did require intubation for airway protection. Lactic acid 1.9. 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 CT Angio chest abdomen and pelvis showed atelectasis, diverticulosis as well as nondisplaced fracture right superior inferior pubic rami. Findings of complete occlusion of left external iliac artery. Placed on 3% saline. Attempts at extubation failed and underwent tracheostomy 10/07/2016 per Dr. Titus Mould.Marland Kitchen Hospital course follow-up orthopedic services Dr. Lyla Glassing in regards to pelvic fracture with recommendations of weightbearing as tolerated with walker intervention. Neurology consult and workup of CVA with echocardiogram showing ejection fraction [id number] grade 1 diastolic dysfunction. Venous Dopplers lower extremities negative. Patient currently with ventilator support. Physical occupational therapy evaluations have been completed. Request for physical medicine rehabilitation consult  Review of Systems  Unable to perform ROS: Acuity of condition   Past Medical History:  Diagnosis Date  . Anemia of chronic illness 11/06/2015  . Ankylosing spondylitis ([place])   . Anxiety   . Arthritis    "psorasic"  . Asthma   . Barrett esophagus   . Blood transfusion ~ 1962   "11 units of blood"  . Candida esophagitis ()   . Carotid artery disease (H[facility])    dopplers showed mod L ICA stenosis   . Carpal tunnel syndrome on right   . Chronic back pain   . Chronic bronchitis ([place])    "since childhood" (12/06/2012)  . Chronic sinus infection   . Cirrhosis of liver without mention of alcohol   . COPD (chronic obstructive pulmonary disease) ([place])   . COPD (chronic obstructive pulmonary disease) ([place])   . Coronary artery disease   . Critical lower limb ischemia, with ulcer Lt foot 12/07/2012  . Dermatophytosis of nail [id number]  . Diverticulosis   . DJD (degenerative joint disease) of knee    "both" (12/06/2012)  . Dysrhythmia    "palpitations"  . Esophageal dilatation   . Exertional dyspnea   . External bleeding hemorrhoids   . GERD (gastroesophageal reflux disease)   . GI bleed   . Gout   . Heart murmur   . Hiatal hernia   . History of nuclear stress test 11/2012   lexiscan; normal, low risk   . Hypercholesteremia   . Hypertension   . Iron deficiency anemia   . Myocardial infarction   . Non-healing ulcer of foot, secondary to diabetes and PVD 12/07/2012  . OA (osteoarthritis)   . PAD (peripheral artery disease) ([place])   . Pericardial effusion    moderate by 2-D echo without evidence of An odd  . Peripheral vascular disease ([place])   . Pneumonia 10/07/11; 2006; 1980's  . Psoriasis   . Psoriasis   . Psoriatic arthritis ([place])   . PVD (peripheral vascular disease) ([place]) 12/07/2012  . Sinus headache   . Tobacco abuse 12/07/2012  . Type II diabetes mellitus ([place])    Past Surgical History:  Procedure Laterality Date  . ABDOMINAL HYSTERECTOMY  1976  . ANGIOPLASTY  12/10/2012   "LLE"; LSFA [place] orbital rotational arthrectomy, PTA , stenting with IDEV stent (left ABI improved from 0.35 to 0.75)  .  APPENDECTOMY    . ATHERECTOMY N/A 12/10/2012   Procedure: ATHERECTOMY;  Surgeon: Lorretta Harp, MD;  Location: [facility] CATH LAB;  Service: Cardiovascular;  Laterality: N/A;  . BREAST LUMPECTOMY Left 10/06/2015   Procedure: LEFT BREAST LUMPECTOMY;  Surgeon: Donnie Mesa, MD;  Location: [place];  Service: General;  Laterality: Left;  . CARDIAC CATHETERIZATION N/A 09/21/2015    Procedure: Left Heart Cath and Coronary Angiography;  Surgeon: Lorretta Harp, MD;  Location: [place] CV LAB;  Service: Cardiovascular;  Laterality: N/A;  . CARPAL TUNNEL RELEASE  2011   left hand  . CATARACT EXTRACTION W/ INTRAOCULAR LENS IMPLANT  2009/2010   right/left  . COLONOSCOPY  2007   Hemorrhoids and Diverticulosis  . CORONARY ANGIOPLASTY     x2  . ENTEROSCOPY N/A 04/14/2015   Procedure: ENTEROSCOPY;  Surgeon: Irene Shipper, MD;  Location: [facility] ENDOSCOPY;  Service: Endoscopy;  Laterality: N/A;  . ESOPHAGOGASTRODUODENOSCOPY  2011  . ESOPHAGOGASTRODUODENOSCOPY N/A 03/17/2014   Procedure: ESOPHAGOGASTRODUODENOSCOPY (EGD);  Surgeon: Irene Shipper, MD;  Location: [facility] ENDOSCOPY;  Service: Endoscopy;  Laterality: N/A;  . EYE SURGERY    . LEFT HEART CATHETERIZATION WITH CORONARY ANGIOGRAM N/A 04/09/2014   Procedure: LEFT HEART CATHETERIZATION WITH CORONARY ANGIOGRAM;  Surgeon: Sinclair Grooms, MD;  Location: [facility] CATH LAB;  Service: Cardiovascular;  Laterality: N/A;  . LIPOMA EXCISION  2010   "left occipital area"  . LOWER EXTREMITY ANGIOGRAM N/A 12/06/2012   Procedure: LOWER EXTREMITY ANGIOGRAM;  Surgeon: Lorretta Harp, MD;  Location: [facility] CATH LAB;  Service: Cardiovascular;  Laterality: N/A;  . NASAL SINUS SURGERY  09/29/2009  . OOPHORECTOMY     'not sure which one"  . PERCUTANEOUS CORONARY ROTOBLATOR INTERVENTION (PCI-R) N/A 04/16/2014   Procedure: PERCUTANEOUS CORONARY ROTOBLATOR INTERVENTION (PCI-R);  Surgeon: Blane Ohara, MD;  Location: [facility] CATH LAB;  Service: Cardiovascular;  Laterality: N/A;  . TRANSMETATARSAL AMPUTATION Left 01/08/2013   Procedure: TRANSMETATARSAL AMPUTATION;  Surgeon: Angelia Mould, MD;  Location: [facility] OR;  Service: Vascular;  Laterality: Left;   Family History  Problem Relation Age of Onset  . Cancer Mother   . Lupus Sister     x2  . Lung cancer Sister   . Thyroid disease Sister   . Heart Problems Maternal Grandmother   . Cancer Paternal  Grandfather   . Diabetes Brother     x2  . Arthritis Brother     x3  . Arthritis Sister     x3  . Colon cancer Neg Hx    Social History:  reports that she has quit smoking. Her smoking use included Cigarettes. She has a 40.00 pack-year smoking history. She quit smokeless tobacco use about 3 years ago. She reports that she does not drink alcohol or use drugs. Allergies:  Allergies  Allergen Reactions  . Amlodipine Besy-Benazepril Hcl Other (See Comments)    Nervous/shakiness  . Statins Other (See Comments)    "can't take any of them; cramp me up" (12/06/2012)  . Tradjenta [Linagliptin] Other (See Comments)    Extreme muscle pains, chest and back pains  . Plavix [Clopidogrel] Rash and Other (See Comments)    Severe burning of the skin.Pt states that the doctor told her that her rash was psoriasis and not related to plavix but she states the rash came from the plavix  . Raptiva [Efalizumab] Rash   Medications Prior to Admission  Medication Sig Dispense Refill  . calcipotriene (DOVONOX) 0.005 % cream Apply 1 application  topically 2 (two) times daily.    . carvedilol (COREG) 3.125 MG tablet Take 1 tablet by mouth 2  times daily with  meals (Patient taking differently: Take 1 tablet (3.125mg ) by mouth 2  times daily with  meals) 180 tablet 3  . dorzolamide (TRUSOPT) 2 % ophthalmic solution Place 1 drop into the right eye 2 (two) times daily.    Marland Kitchen esomeprazole (NEXIUM) 40 MG capsule Take 40 mg by mouth daily before breakfast.    . furosemide (LASIX) 40 MG tablet Take 1 tablet (40 mg total) by mouth daily. 30 tablet   . glimepiride (AMARYL) 2 MG tablet Take 2 mg by mouth daily with breakfast.     . metFORMIN (GLUCOPHAGE) 500 MG tablet Take 500 mg by mouth daily after supper. Pt. Told that she is  only to take only as needed , for a blood sugar of 250 or greater    . nystatin (MYCOSTATIN) 100000 UNIT/ML suspension Use as directed 5 mLs in the mouth or throat every 8 (eight) hours.    .  pravastatin (PRAVACHOL) 10 MG tablet Take 10 mg by mouth daily. Monday  and Thursday     . predniSONE (DELTASONE) 5 MG tablet Take 5 mg by mouth daily.     . ranitidine (ZANTAC) 150 MG tablet Take 150 mg by mouth daily.    . Travoprost, BAK Free, (TRAVATAMN) 0.004 % SOLN ophthalmic solution Place 1 drop into both eyes at bedtime.     Marland Kitchen ADVAIR HFA 115-21 MCG/ACT inhaler Inhale 2 puffs into the lungs daily.    Marland Kitchen albuterol (PROVENTIL HFA;VENTOLIN HFA) 108 (90 BASE) MCG/ACT inhaler Inhale 1 puff into the lungs every 6 (six) hours as needed for shortness of breath.     . ALPRAZolam (XANAX) 0.5 MG tablet Take 0.25 mg by mouth 2 (two) times daily as needed for anxiety or sleep.    Marland Kitchen ammonium lactate (LAC-HYDRIN) 12 % lotion Apply 1 application topically daily as needed for dry skin (psorias).     . Artificial Tear Ointment (LUBRICANT EYE OP) Place 1 drop into both eyes as needed (for dry eyes).    Marland Kitchen aspirin 81 MG tablet Take 81 mg by mouth daily.    . Cholecalciferol (VITAMIN D) 2000 UNITS CAPS Take 2,000 Units by mouth every morning.     . feeding supplement, GLUCERNA SHAKE, (GLUCERNA SHAKE) LIQD Take 237 mLs by mouth daily at 3 pm.     . ferrous sulfate 325 (65 FE) MG tablet Take 1 tablet (325 mg total) by mouth 2 (two) times daily with a meal. 60 tablet 2  . fluticasone (FLONASE) 50 MCG/ACT nasal spray Place 1 spray into both nostrils at bedtime as needed for allergies.     Marland Kitchen ipratropium-albuterol (DUONEB) 0.5-2.5 (3) MG/3ML SOLN Take 3 mLs by nebulization every 4 (four) hours as needed (shortness of breath).    . Liniments ([employer] ARTHRITIS PAIN RELIEVER EX) Apply 1 application topically 2 (two) times daily as needed (for shoulder pains, knee, back.).     Marland Kitchen Multiple Vitamins-Minerals (ICAPS) CAPS Take 1 capsule by mouth every morning.     . ONE TOUCH ULTRA TEST test strip 1 each by Other route See admin instructions. Check blood sugar twice daily    . simethicone (MYLICON) 80 MG chewable tablet Chew 1  tablet (80 mg total) by mouth 4 (four) times daily as needed for flatulence. 30 tablet 0    Home: Home Living Family/patient expects to be discharged to::  Inpatient rehab Home Equipment: Gilford Rile - 2 wheels, Cane - single point, Bedside commode  Functional History: Prior Function Comments: Not able to obtain PLOF/history due to aphasia and pt on the ventilator.  Functional Status:  Mobility: Bed Mobility Overal bed mobility: Needs Assistance, +2 for physical assistance Bed Mobility: Rolling, Supine to Sit, Sit to Supine Rolling: Max assist Supine to sit: Total assist, +2 for safety/equipment, +2 for physical assistance, HOB elevated Sit to supine: Total assist, +2 for physical assistance, +2 for safety/equipment General bed mobility comments: pt limited assistance to help turn in bed, pt not assisting with transition to or from EOB with assist to elevate trunk as well as bring legs off and back onto bed. Total assist +2 to scoot to [facility]. Rolling bil x 2 for pericare Transfers Overall transfer level: Needs assistance Equipment used: 2 person hand held assist Transfers: Sit to/from Stand Sit to Stand: Mod assist, +2 physical assistance General transfer comment: unable to attempt      ADL: ADL Overall ADL's : Needs assistance/impaired Eating/Feeding: NPO Grooming: Wash/dry face, Maximal assistance Upper Body Bathing: Maximal assistance Lower Body Bathing: Total assistance General ADL Comments: pt eob for > 10 minutes and progressed from max (A) sitting to min guard (A). pt moving bil Le. pt could attempt static standing next session. pt currently full vent support with propofol turned off for session  Cognition: Cognition Overall Cognitive Status: Difficult to assess Orientation Level: Intubated/Tracheostomy - Unable to assess Cognition Arousal/Alertness: Lethargic Behavior During Therapy: Flat affect Overall Cognitive Status: Difficult to assess Area of Impairment: Attention,  Following commands Current Attention Level: Focused Following Commands: Follows one step commands inconsistently Problem Solving: Slow processing, Decreased initiation, Requires verbal cues General Comments: pt maintaining left gaze will not come to or cross midline. nodding yes or no response to all questions Difficult to assess due to: Tracheostomy  Blood pressure (!) 162/95, pulse 87, temperature 98.6 F (37 C), temperature source Oral, resp. rate (!) 28, height 5\' 7"  (1.702 m), weight 81.2 kg (179 lb 0.2 oz), SpO2 97 %. Physical Exam  Vitals reviewed. Constitutional: She appears well-developed and well-nourished.  In restraints  HENT:  Head: Normocephalic.  Right Ear: External ear normal.  Left Ear: External ear normal.  Eyes: Right eye exhibits no discharge. Left eye exhibits no discharge.  EOM appear to be intact  Neck:  +Trach  Cardiovascular: Normal rate and regular rhythm.   Respiratory: She has no wheezes.  +Vent  GI: Soft. Bowel sounds are normal.  +NG  Musculoskeletal: She exhibits no edema or tenderness.  Neurological:  Easily arousable DTRs symmetric Not responding to commands, appears to have left gaze preference Moving LUE/LLE spontaneously  Skin: Skin is warm and dry. No erythema.  Psychiatric:  Unable to assess due to mentation    Results for orders placed or performed during the hospital encounter of 09/30/16 (from the past 24 hour(s))  Glucose, capillary     Status: Abnormal   Collection Time: 10/10/16 11:58 AM  Result Value Ref Range   Glucose-Capillary 237 (H) 65 - 99 mg/dL   Comment 1 Notify RN    Comment 2 Document in Chart   Glucose, capillary     Status: Abnormal   Collection Time: 10/10/16  4:53 PM  Result Value Ref Range   Glucose-Capillary 187 (H) 65 - 99 mg/dL   Comment 1 Notify RN   Glucose, capillary     Status: Abnormal   Collection Time: 10/10/16  8:43 PM  Result Value Ref Range   Glucose-Capillary 207 (H) 65 - 99 mg/dL  Glucose,  capillary     Status: Abnormal   Collection Time: 10/11/16 12:11 AM  Result Value Ref Range   Glucose-Capillary 220 (H) 65 - 99 mg/dL  CBC     Status: Abnormal   Collection Time: 10/11/16  3:40 AM  Result Value Ref Range   WBC 15.0 (H) 4.0 - 10.5 K/uL   RBC 2.96 (L) 3.87 - 5.11 MIL/uL   Hemoglobin 9.0 (L) 12.0 - 15.0 g/dL   HCT 29.7 (L) 36.0 - 46.0 %   MCV 100.3 (H) 78.0 - 100.0 fL   MCH 30.4 26.0 - 34.0 pg   MCHC 30.3 30.0 - 36.0 g/dL   RDW 16.2 (H) 11.5 - 15.5 %   Platelets 187 150 - 400 K/uL  Comprehensive metabolic panel     Status: Abnormal   Collection Time: 10/11/16  3:40 AM  Result Value Ref Range   Sodium 152 (H) 135 - 145 mmol/L   Potassium 3.5 3.5 - 5.1 mmol/L   Chloride 117 (H) 101 - 111 mmol/L   CO2 26 22 - 32 mmol/L   Glucose, Bld 248 (H) 65 - 99 mg/dL   BUN 48 (H) 6 - 20 mg/dL   Creatinine, Ser 1.37 (H) 0.44 - 1.00 mg/dL   Calcium 9.6 8.9 - 10.3 mg/dL   Total Protein 6.1 (L) 6.5 - 8.1 g/dL   Albumin 2.3 (L) 3.5 - 5.0 g/dL   AST 41 15 - 41 U/L   ALT 55 (H) 14 - 54 U/L   Alkaline Phosphatase 112 38 - 126 U/L   Total Bilirubin 0.7 0.3 - 1.2 mg/dL   GFR calc non Af Amer 36 (L) >60 mL/min   GFR calc Af Amer 42 (L) >60 mL/min   Anion gap 9 5 - 15  Glucose, capillary     Status: Abnormal   Collection Time: 10/11/16  4:10 AM  Result Value Ref Range   Glucose-Capillary 240 (H) 65 - 99 mg/dL   Comment 1 Notify RN    Comment 2 Document in Chart   Glucose, capillary     Status: Abnormal   Collection Time: 10/11/16  8:30 AM  Result Value Ref Range   Glucose-Capillary 251 (H) 65 - 99 mg/dL   Comment 1 Document in Chart    Dg Chest Port 1 View  Result Date: 10/10/2016 CLINICAL DATA:  Acute respiratory failure with hypoxemia EXAM: PORTABLE CHEST 1 VIEW COMPARISON:  Yesterday FINDINGS: Nasogastric in place of feeding tube, at least reaches the stomach. Left upper extremity PICC with tip at the SVC level. Tracheostomy tube remains seated. COPD with chronic  hyperinflation and apical lucency. Improving basilar aeration with mild residual pneumonia or atelectasis. Chronic cardiopericardial enlargement. No edema, effusion, or pneumothorax. IMPRESSION: 1. Unremarkable positioning of tubes and central line. 2. Improving basilar aeration. Electronically Signed   By: Monte Fantasia M.D.   On: 10/10/2016 07:13   Dg Abd Portable 1v  Result Date: 10/10/2016 CLINICAL DATA:  Feeding tube placement EXAM: PORTABLE ABDOMEN - 1 VIEW COMPARISON:  Abdominal radiograph 10/08/2016 FINDINGS: The enteric tube courses beyond the field of view in the tip is not visualized adequately. There is bibasilar atelectasis. IMPRESSION: Inadequate visualization of enteric tube tip, which is beyond the field of view. Electronically Signed   By: Ulyses Jarred M.D.   On: 10/10/2016 16:09    Assessment/Plan: Diagnosis: left MCA infarct with hemorrhagic transformation Labs and images  independently reviewed.  Records reviewed and summated above. Stroke: Continue secondary stroke prophylaxis and Risk Factor Modification listed below:   Blood Pressure Management:  Continue current medication with prn's with permisive HTN per primary team Statin Agent:   Diabetes management:   Right sided hemiparesis: fit for orthosis to prevent contractures (resting hand splint for day, wrist cock up splint at night, PRAFO, etc Motor recovery: Fluoxetine  1. Does the need for close, 24 hr/day medical supervision in concert with the patient's rehab needs make it unreasonable for this patient to be served in a less intensive setting? Yes  2. Co-Morbidities requiring supervision/potential complications: combined CHF (monitor for fluid overload), tracheostomy on vent (wean from vent), occlusion of left external iliac artery (meds), nondisplaced fracture right superior inferior pubic rami (WBAT per Ortho), diverticulosis (monitor), diabetes mellitus (Monitor in accordance with exercise and adjust meds as  necessary), CAD (Cont meds), COPD (monitor O2 sats and RR with increased activity), hyperlipidemia (cont meds), tachypnea (monitor RR and O2 Sats with increased physical exertion), HTN (monitor and provide prns in accordance with increased physical exertion and pain), hypernatremia (cont to monitor, trending up, treat if necessary), hypokalemia (continue to monitor and replete as necessary), hypoalbuminemia (maximize nutrition for overall health and wound healing), AKI on CKD (avoid nephrotoxic meds), leukocytosis (cont to monitor for signs and symptoms of infection, further workup if indicated), ABLA on macrocytic anemia (transfuse if necessary to ensure appropriate perfusion for increased activity tolerance) 3. Due to bladder management, bowel management, safety, skin/wound care, disease management, medication administration and patient education, does the patient require 24 hr/day rehab nursing? Yes 4. Does the patient require coordinated care of a physician, rehab nurse, PT (1-2 hrs/day, 5 days/week), OT (1-2 hrs/day, 5 days/week) and SLP (1-2 hrs/day, 5 days/week) to address physical and functional deficits in the context of the above medical diagnosis(es)? Yes Addressing deficits in the following areas: balance, endurance, locomotion, strength, transferring, bowel/bladder control, bathing, dressing, feeding, grooming, toileting, cognition, speech, language, swallowing and psychosocial support 5. Can the patient actively participate in an intensive therapy program of at least 3 hrs of therapy per day at least 5 days per week? Not at present 6. The potential for patient to make measurable gains while on inpatient rehab is good 7. Anticipated functional outcomes upon discharge from inpatient rehab are min assist and mod assist  with PT, min assist and mod assist with OT, min assist and mod assist with SLP. 8. Estimated rehab length of stay to reach the above functional goals is: 20-24 days. 9. Does the  patient have adequate social supports and living environment to accommodate these discharge functional goals? Potentially 10. Anticipated D/C setting: Home 11. Anticipated post D/C treatments: HH therapy and Home excercise program 12. Overall Rehab/Functional Prognosis: good and fair  RECOMMENDATIONS: This patient's condition is appropriate for continued rehabilitative care in the following setting: Will consider CIR admission when medically stable to decrease burden of care when/if medically appropriate. Patient has agreed to participate in recommended program. N/A Note that insurance prior authorization may be required for reimbursement for recommended care.  Comment: Rehab Admissions Coordinator to follow up.  Delice Lesch, MD, Mellody Drown 10/11/2016

## 2016-10-12 DIAGNOSIS — I63412 Cerebral infarction due to embolism of left middle cerebral artery: Principal | ICD-10-CM

## 2016-10-12 LAB — GLUCOSE, CAPILLARY
GLUCOSE-CAPILLARY: 168 mg/dL — AB (ref 65–99)
GLUCOSE-CAPILLARY: 186 mg/dL — AB (ref 65–99)
GLUCOSE-CAPILLARY: 192 mg/dL — AB (ref 65–99)
GLUCOSE-CAPILLARY: 258 mg/dL — AB (ref 65–99)
Glucose-Capillary: 193 mg/dL — ABNORMAL HIGH (ref 65–99)
Glucose-Capillary: 218 mg/dL — ABNORMAL HIGH (ref 65–99)

## 2016-10-12 LAB — BASIC METABOLIC PANEL
Anion gap: 9 (ref 5–15)
Anion gap: 8 (ref 5–15)
BUN: 48 mg/dL — AB (ref 6–20)
BUN: 45 mg/dL — AB (ref 6–20)
CALCIUM: 9.3 mg/dL (ref 8.9–10.3)
CHLORIDE: 111 mmol/L (ref 101–111)
CO2: 27 mmol/L (ref 22–32)
CO2: 25 mmol/L (ref 22–32)
CREATININE: 1.33 mg/dL — AB (ref 0.44–1.00)
CREATININE: 1.22 mg/dL — AB (ref 0.44–1.00)
Calcium: 8.3 mg/dL — ABNORMAL LOW (ref 8.9–10.3)
Chloride: 101 mmol/L (ref 101–111)
GFR calc Af Amer: 49 mL/min — ABNORMAL LOW (ref >60)
GFR calc non Af Amer: 38 mL/min — ABNORMAL LOW (ref >60)
GFR, EST AFRICAN AMERICAN: 44 mL/min — AB (ref >60)
GFR, EST NON AFRICAN AMERICAN: 42 mL/min — AB (ref >60)
Glucose, Bld: 202 mg/dL — ABNORMAL HIGH (ref 65–99)
Glucose, Bld: 547 mg/dL (ref 65–99)
Potassium: 3.7 mmol/L (ref 3.5–5.1)
Potassium: 3.4 mmol/L — ABNORMAL LOW (ref 3.5–5.1)
SODIUM: 147 mmol/L — AB (ref 135–145)
SODIUM: 134 mmol/L — AB (ref 135–145)

## 2016-10-12 LAB — CBC
HCT: 25.2 % — ABNORMAL LOW (ref 36.0–46.0)
Hemoglobin: 7.5 g/dL — ABNORMAL LOW (ref 12.0–15.0)
MCH: 30.1 pg (ref 26.0–34.0)
MCHC: 29.8 g/dL — ABNORMAL LOW (ref 30.0–36.0)
MCV: 101.2 fL — AB (ref 78.0–100.0)
PLATELETS: 182 10*3/uL (ref 150–400)
RBC: 2.49 MIL/uL — AB (ref 3.87–5.11)
RDW: 16.3 % — AB (ref 11.5–15.5)
WBC: 9.1 10*3/uL (ref 4.0–10.5)

## 2016-10-12 MED ORDER — FUROSEMIDE 10 MG/ML IJ SOLN
40.0000 mg | Freq: Two times a day (BID) | INTRAMUSCULAR | Status: DC
  Administered 2016-10-12 – 2016-10-14 (×5): 40 mg via INTRAVENOUS
  Filled 2016-10-12 (×6): qty 4

## 2016-10-12 MED ORDER — HEPARIN SODIUM (PORCINE) 5000 UNIT/ML IJ SOLN
5000.0000 [IU] | Freq: Three times a day (TID) | INTRAMUSCULAR | Status: DC
  Administered 2016-10-14 – 2016-10-20 (×20): 5000 [IU] via SUBCUTANEOUS
  Filled 2016-10-12 (×23): qty 1

## 2016-10-12 MED ORDER — ISOSORBIDE MONONITRATE 15 MG HALF TABLET
15.0000 mg | ORAL_TABLET | Freq: Every day | ORAL | Status: DC
  Filled 2016-10-12: qty 1

## 2016-10-12 MED ORDER — HYDROCHLOROTHIAZIDE 10 MG/ML ORAL SUSPENSION
25.0000 mg | Freq: Every day | ORAL | Status: DC
  Administered 2016-10-12 – 2016-10-13 (×2): 25 mg
  Filled 2016-10-12 (×3): qty 2.5

## 2016-10-12 NOTE — Progress Notes (Signed)
Inpatient Rehabilitation  Pt. remains on the vent.  No family present at this time. Will follow along at a distance for any progress toward a possible IP rehab admission when medically stable/ready.  [facility] Admissions Coordinator Cell [phone] Office [phone]

## 2016-10-12 NOTE — Progress Notes (Signed)
Physical Therapy Treatment Patient Details Name: [name] MRN: [id number] DOB: 05-29-1941 Today's Date: 10/12/2016    History of Present Illness Patient is a 75 y/o female with hx of DM, PVD, PAD, MI, HTN, gout, COPD, CAD, lower limb ischemia presents with right sided weakness. 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 Found to have Nondisplaced fracture of the right superior and inferior pubic rami. 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     PT Comments    Patient seen for mobility progression and therapy in conjunction with SLP, at this time, patient tolerating some aspects of therapy well, performed EOB activity and static standing but remains limited overall by weakness, fatigue and cognitive impairments. Given current deficits and co-morbidities, feel patient may be more appropriate for LTACH upon acute discharge. Will continue to see as indicated and progress as tolerated.  Follow Up Recommendations  LTACH     Equipment Recommendations  None recommended by PT    Recommendations for Other Services Rehab consult     Precautions / Restrictions Precautions Precautions: Fall Precaution Comments: wrist restraints; vent Restrictions Weight Bearing Restrictions: No    Mobility  Bed Mobility Overal bed mobility: Needs Assistance;+2 for physical assistance Bed Mobility: Rolling;Supine to Sit;Sit to Supine Rolling: Max assist   Supine to sit: Max assist;+2 for physical assistance;+2 for safety/equipment;HOB elevated Sit to supine: Max assist;+2 for physical assistance;+2 for safety/equipment   General bed mobility comments: Patient able to perform some movement of LE and able to reacah across with UE and multi-modal cues  Transfers Overall transfer level: Needs assistance Equipment used: 2 person hand held assist  (with wrap around support) Transfers: Sit to/from Stand Sit to Stand: Mod assist;+2 physical assistance (max assist to maintain upright)         General transfer comment: Patient able to power up with increased assist +2, patient with instability in standing, with right lateral lean requiring max assist to maintain upright prior and +2 max assist to control descent to bed  Ambulation/Gait                 Stairs            Wheelchair Mobility    Modified Rankin (Stroke Patients Only) Modified Rankin (Stroke Patients Only) Pre-Morbid Rankin Score: No symptoms Modified Rankin: Severe disability     Balance     Sitting balance-Leahy Scale: Poor Sitting balance - Comments: initially max assist for sitting balance with pushers presentation, patient redirected to midline with oposing lateral lean, then patient able to maintain sitting balance with min assist for periods of time. Postural control: Right lateral lean   Standing balance-Leahy Scale: Poor Standing balance comment: required moderate to max assist for static standing                    Cognition Arousal/Alertness: Lethargic Behavior During Therapy: Flat affect;Restless Overall Cognitive Status: Difficult to assess Area of Impairment: Attention;Following commands   Current Attention Level: Focused   Following Commands: Follows one step commands consistently     Problem Solving: Slow processing;Decreased initiation;Requires verbal cues General Comments: patient with inconsistent response to yes/no questioning    Exercises General Exercises - Lower Extremity Long Arc Quad: Both;Seated (cues for toe taps and RLE modified LAQ) Other Exercises Other Exercises: dynamic trunk control activity in sitting at EOB to faciliatate midline Other  Exercises: placing of UE for faciliatation of postural alignment    General Comments        Pertinent Vitals/Pain Pain Assessment: Faces Faces Pain Scale:  Hurts even more Pain Location: generalized pain and increased grimace with pericare Pain Descriptors / Indicators: Grimacing Pain Intervention(s): Monitored during session;Repositioned;Relaxation    Home Living                      Prior Function            PT Goals (current goals can now be found in the care plan section) Acute Rehab PT Goals Patient Stated Goal: pt unable to state PT Goal Formulation: Patient unable to participate in goal setting Time For Goal Achievement: 10/19/16 Potential to Achieve Goals: Fair Progress towards PT goals: Progressing toward goals (modest progression)    Frequency    Min 3X/week      PT Plan Discharge plan needs to be updated;Frequency needs to be updated (if pt able to progress activity )    Co-evaluation PT/OT/SLP Co-Evaluation/Treatment: Yes Reason for Co-Treatment: Complexity of the patient's impairments (multi-system involvement) PT goals addressed during session: Mobility/safety with mobility       End of Session Equipment Utilized During Treatment:  (Vent) Activity Tolerance: Patient tolerated treatment well Patient left: in bed;with call bell/phone within reach;with bed alarm set;with nursing/sitter in room;with SCD's reapplied     Time: [id number] PT Time Calculation (min) (ACUTE ONLY): 44 min  Charges:  $Therapeutic Activity: 8-22 mins $Self Care/Home Management: 2023/08/06                    G Codes:      Duncan Dull 10-29-16, 5:54 PM Alben Deeds, [place] DPT  [phone]

## 2016-10-12 NOTE — Progress Notes (Addendum)
SLP Cancellation Note  Patient Details Name: [name] MRN: [id number] DOB: 05-04-1941   Cancelled treatment:       Reason Eval/Treat Not Completed: Medical issues which prohibited therapy (Pt on vent) Discussed with MD, pt is not appropriate for PO's at this time - per MD discontinue. Will await orders for PMV when pt is ready.   Dal Blew B. Rutherford Nail, M.S., CCC-SLP Speech-Language Pathologist [phone] Damontay Alred Rutherford Nail 10/12/2016, 10:33 AM

## 2016-10-12 NOTE — Care Management Note (Addendum)
Case Management Note Original note created by Julie Amerson 10/06/16  Patient Details  Name: [name] MRN: [id number] Date of Birth: 07/09/1941  Subjective/Objective:  Pt admitted on 09/30/16 s/p Lt MCA infarct.  PTA, pt independent, lives with family members.                    Action/Plan: Pt remains intubated currently.  Planning possible tracheostomy on Friday, 11/10.  Will follow progress.    Expected Discharge Date:                  Expected Discharge Plan:  Long Term Acute Care (LTAC)  In-House Referral:     Discharge planning Services  CM Consult  Post Acute Care Choice:    Choice offered to:     DME Arranged:    DME Agency:     HH Arranged:    HH Agency:     Status of Service:  In process, will continue to follow  If discussed at Long Length of Stay Meetings, dates discussed:    Additional Comments: 10/12/2016  CM again requested attending to consult with pt's sister so that determination of LTACH could be made.  Attempt at contact was made by attending however sister was unreachable via phone - will continue to try.  Pt is scheduled for PEG tube placement tomorrow  10/11/16 [facility] liason met with sister at bedside.  Sister requested attending conference prior to choosing which facility - attending paged  Discussed in LOS 11/14:  Pt remains appropriate for continued stay and deemed appropriate for LTACH referral.  CM provided referral to both [facility] and [facility].  CM spoke with both brother [name] and sister [name] and choice for LTACH was given.  Sister immediately chose [facility] due to location - sister is in agreement to meet with [facility] today at 1pm.  CM will inform [facility] of selection.  Pt stays with sister and per sister; pt was completely independent prior to admit   10/10/16 Pt transferred to [facility] over weekend.  Pt trached 10/07/16 - remains on ventilator 30%, sedation discontinued earlier this am, tube feeds on hold awaiting access - may need PEG at  some point.  Creatinine rising.  Attending requested LTACH referral - CM called referral in to physician advisor to determine appropriateness.  PT recommending CIR; however pt is not currently participating in therapies 

## 2016-10-12 NOTE — Consult Note (Signed)
Chief Complaint: Patient was seen in consultation today for percutaneous gastric tube placement at the request of Dr. Adine Madura  Referring Physician(s): Dr. Merrie Roof  Supervising Physician: Marybelle Killings  Patient Status: [facility] - In-pt  History of Present Illness: [name] is a 75 y.o. 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  Trach/vent.  Per SLP note: Medical issues which prohibited therapy (Pt on vent) Discussed with MD, pt is not appropriate for PO's at this time - per MD discontinue.  Request for percutaneous gastric tube from Dr. Titus Mould due to nasal obstruction preventing NG tube placement and pt needing long-term enteral nutrition support once discharged to rehab. Anatomy evaluated by CT; reviewed and approved by Dr. Vernard Gambles.     Past Medical History:  Diagnosis Date  . Anemia of chronic illness 11/06/2015  . Ankylosing spondylitis ([place])   . Anxiety   . Arthritis    "psorasic"  . Asthma   . Barrett esophagus   . Blood transfusion ~ 1962   "11 units of blood"  . Candida esophagitis ([place])   . Carotid artery disease (HCC)    dopplers showed mod L ICA stenosis   . Carpal tunnel syndrome on right   . Chronic back pain   . Chronic bronchitis ([place])    "since childhood" (12/06/2012)  . Chronic sinus infection   . Cirrhosis of liver without mention of alcohol   . COPD (chronic obstructive pulmonary disease) ([place])   . COPD (chronic obstructive pulmonary disease) ([place])   . Coronary artery disease   . Critical lower limb ischemia, with ulcer Lt foot 12/07/2012  . Dermatophytosis of nail [id number]  . Diverticulosis   . DJD (degenerative joint disease) of knee    "both" (12/06/2012)  . Dysrhythmia    "palpitations"  . Esophageal dilatation   . Exertional dyspnea   . External bleeding hemorrhoids   . GERD (gastroesophageal reflux disease)   . GI bleed   . Gout   . Heart murmur   . Hiatal  hernia   . History of nuclear stress test 11/2012   lexiscan; normal, low risk   . Hypercholesteremia   . Hypertension   . Iron deficiency anemia   . Myocardial infarction   . Non-healing ulcer of foot, secondary to diabetes and PVD 12/07/2012  . OA (osteoarthritis)   . PAD (peripheral artery disease) ([place])   . Pericardial effusion    moderate by 2-D echo without evidence of An odd  . Peripheral vascular disease ([place])   . Pneumonia 10/07/11; 2006; 1980's  . Psoriasis   . Psoriasis   . Psoriatic arthritis ([place])   . PVD (peripheral vascular disease) ([place]) 12/07/2012  . Sinus headache   . Tobacco abuse 12/07/2012  . Type II diabetes mellitus ([place])     Past Surgical History:  Procedure Laterality Date  . ABDOMINAL HYSTERECTOMY  1976  . ANGIOPLASTY  12/10/2012   "LLE"; LSFA [place] orbital rotational arthrectomy, PTA , stenting with IDEV stent (left ABI improved from 0.35 to 0.75)  . APPENDECTOMY    . ATHERECTOMY N/A 12/10/2012   Procedure: ATHERECTOMY;  Surgeon: Lorretta Harp, MD;  Location: [facility] CATH LAB;  Service: Cardiovascular;  Laterality: N/A;  . BREAST LUMPECTOMY Left 10/06/2015   Procedure: LEFT BREAST LUMPECTOMY;  Surgeon: Donnie Mesa, MD;  Location: [place];  Service: General;  Laterality: Left;  . CARDIAC CATHETERIZATION N/A 09/21/2015   Procedure: Left Heart Cath  and Coronary Angiography;  Surgeon: Lorretta Harp, MD;  Location: [place] CV LAB;  Service: Cardiovascular;  Laterality: N/A;  . CARPAL TUNNEL RELEASE  2011   left hand  . CATARACT EXTRACTION W/ INTRAOCULAR LENS IMPLANT  2009/2010   right/left  . COLONOSCOPY  2007   Hemorrhoids and Diverticulosis  . CORONARY ANGIOPLASTY     x2  . ENTEROSCOPY N/A 04/14/2015   Procedure: ENTEROSCOPY;  Surgeon: Irene Shipper, MD;  Location: [facility] ENDOSCOPY;  Service: Endoscopy;  Laterality: N/A;  . ESOPHAGOGASTRODUODENOSCOPY  2011  . ESOPHAGOGASTRODUODENOSCOPY N/A 03/17/2014   Procedure: ESOPHAGOGASTRODUODENOSCOPY (EGD);   Surgeon: Irene Shipper, MD;  Location: [facility] ENDOSCOPY;  Service: Endoscopy;  Laterality: N/A;  . EYE SURGERY    . LEFT HEART CATHETERIZATION WITH CORONARY ANGIOGRAM N/A 04/09/2014   Procedure: LEFT HEART CATHETERIZATION WITH CORONARY ANGIOGRAM;  Surgeon: Sinclair Grooms, MD;  Location: [facility] CATH LAB;  Service: Cardiovascular;  Laterality: N/A;  . LIPOMA EXCISION  2010   "left occipital area"  . LOWER EXTREMITY ANGIOGRAM N/A 12/06/2012   Procedure: LOWER EXTREMITY ANGIOGRAM;  Surgeon: Lorretta Harp, MD;  Location: [facility] CATH LAB;  Service: Cardiovascular;  Laterality: N/A;  . NASAL SINUS SURGERY  09/29/2009  . OOPHORECTOMY     'not sure which one"  . PERCUTANEOUS CORONARY ROTOBLATOR INTERVENTION (PCI-R) N/A 04/16/2014   Procedure: PERCUTANEOUS CORONARY ROTOBLATOR INTERVENTION (PCI-R);  Surgeon: Blane Ohara, MD;  Location: [facility] CATH LAB;  Service: Cardiovascular;  Laterality: N/A;  . TRANSMETATARSAL AMPUTATION Left 01/08/2013   Procedure: TRANSMETATARSAL AMPUTATION;  Surgeon: Angelia Mould, MD;  Location: [place];  Service: Vascular;  Laterality: Left;    Allergies: Amlodipine besy-benazepril hcl; Statins; Tradjenta [linagliptin]; Plavix [clopidogrel]; and Raptiva [efalizumab]  Medications: Prior to Admission medications   Medication Sig Start Date End Date Taking? Authorizing Provider  calcipotriene (DOVONOX) 0.005 % cream Apply 1 application topically 2 (two) times daily. 09/20/16  Yes Historical Provider, MD  carvedilol (COREG) 3.125 MG tablet Take 1 tablet by mouth 2  times daily with  meals Patient taking differently: Take 1 tablet (3.125mg ) by mouth 2  times daily with  meals 07/15/16  Yes Lorretta Harp, MD  dorzolamide (TRUSOPT) 2 % ophthalmic solution Place 1 drop into the right eye 2 (two) times daily.   Yes Historical Provider, MD  esomeprazole (NEXIUM) 40 MG capsule Take 40 mg by mouth daily before breakfast.   Yes Historical Provider, MD  furosemide (LASIX) 40 MG tablet Take 1  tablet (40 mg total) by mouth daily. 07/07/14  Yes Lorretta Harp, MD  glimepiride (AMARYL) 2 MG tablet Take 2 mg by mouth daily with breakfast.  10/21/14  Yes Historical Provider, MD  metFORMIN (GLUCOPHAGE) 500 MG tablet Take 500 mg by mouth daily after supper. Pt. Told that she is  only to take only as needed , for a blood sugar of 250 or greater   Yes Historical Provider, MD  nystatin (MYCOSTATIN) 100000 UNIT/ML suspension Use as directed 5 mLs in the mouth or throat every 8 (eight) hours. 09/19/16  Yes Historical Provider, MD  pravastatin (PRAVACHOL) 10 MG tablet Take 10 mg by mouth daily. Monday  and Thursday    Yes Historical Provider, MD  predniSONE (DELTASONE) 5 MG tablet Take 5 mg by mouth daily.    Yes Historical Provider, MD  ranitidine (ZANTAC) 150 MG tablet Take 150 mg by mouth daily. 09/20/16  Yes Historical Provider, MD  Travoprost, BAK Free, (TRAVATAMN) 0.004 % SOLN  ophthalmic solution Place 1 drop into both eyes at bedtime.    Yes Historical Provider, MD  ADVAIR [facility] 115-21 MCG/ACT inhaler Inhale 2 puffs into the lungs daily. 09/01/15   Historical Provider, MD  albuterol (PROVENTIL HFA;VENTOLIN HFA) 108 (90 BASE) MCG/ACT inhaler Inhale 1 puff into the lungs every 6 (six) hours as needed for shortness of breath.     Historical Provider, MD  ALPRAZolam Duanne Moron) 0.5 MG tablet Take 0.25 mg by mouth 2 (two) times daily as needed for anxiety or sleep.    Historical Provider, MD  ammonium lactate (LAC-HYDRIN) 12 % lotion Apply 1 application topically daily as needed for dry skin (psorias).     Historical Provider, MD  Artificial Tear Ointment (LUBRICANT EYE OP) Place 1 drop into both eyes as needed (for dry eyes).    Historical Provider, MD  aspirin 81 MG tablet Take 81 mg by mouth daily.    Historical Provider, MD  Cholecalciferol (VITAMIN D) 2000 UNITS CAPS Take 2,000 Units by mouth every morning.     Historical Provider, MD  feeding supplement, GLUCERNA SHAKE, (GLUCERNA SHAKE) LIQD Take 237  mLs by mouth daily at 3 pm.     Historical Provider, MD  ferrous sulfate 325 (65 FE) MG tablet Take 1 tablet (325 mg total) by mouth 2 (two) times daily with a meal. 03/21/14   Bobby Rumpf [place], PA-C  fluticasone (FLONASE) 50 MCG/ACT nasal spray Place 1 spray into both nostrils at bedtime as needed for allergies.     Historical Provider, MD  ipratropium-albuterol (DUONEB) 0.5-2.5 (3) MG/3ML SOLN Take 3 mLs by nebulization every 4 (four) hours as needed (shortness of breath).    Historical Provider, MD  Liniments ([employer] ARTHRITIS PAIN RELIEVER EX) Apply 1 application topically 2 (two) times daily as needed (for shoulder pains, knee, back.).     Historical Provider, MD  Multiple Vitamins-Minerals (ICAPS) CAPS Take 1 capsule by mouth every morning.     Historical Provider, MD  ONE TOUCH ULTRA TEST test strip 1 each by Other route See admin instructions. Check blood sugar twice daily 06/03/15   Historical Provider, MD  simethicone (MYLICON) 80 MG chewable tablet Chew 1 tablet (80 mg total) by mouth 4 (four) times daily as needed for flatulence. 12/04/13   Robbie Lis, MD     Family History  Problem Relation Age of Onset  . Cancer Mother   . Lupus Sister     x2  . Lung cancer Sister   . Thyroid disease Sister   . Heart Problems Maternal Grandmother   . Cancer Paternal Grandfather   . Diabetes Brother     x2  . Arthritis Brother     x3  . Arthritis Sister     x3  . Colon cancer Neg Hx     Social History   Social History  . Marital status: Widowed    Spouse name: N/A  . Number of children: 0  . Years of education: N/A   Occupational History  . 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 Smoking status: Former Smoker    Packs/day: 1.00    Years: 40.00    Types: Cigarettes  . Smokeless tobacco: Former Systems developer    Quit date: 09/28/2013     Comment: pt states that she smokes "every once in a while"  . Alcohol use No     Comment: "quit social drinking years  ago"  . Drug use: No  .  Sexual activity: No   Other Topics Concern  . None   Social History Narrative  . None    Review of Systems: A 12 point ROS discussed and pertinent positives are indicated in the HPI above.  All other systems are negative.  Review of Systems  Constitutional: Negative for fever.  Respiratory: Positive for wheezing. Negative for cough and shortness of breath.   Cardiovascular: Negative for chest pain.  Gastrointestinal: Negative for abdominal distention and abdominal pain.    Vital Signs: BP 128/67 (BP Location: Right Arm)   Pulse 69   Temp 98.9 F (37.2 C) (Oral)   Resp (!) 27   Ht 5\' 7"  (1.702 m)   Wt 177 lb 11.1 oz (80.6 kg)   SpO2 97%   BMI 27.83 kg/m   Physical Exam  Constitutional: She appears well-developed and well-nourished.  Cardiovascular: Normal rate and regular rhythm.   Pulmonary/Chest: Effort normal. She has wheezes. She has rales.  Trach/vent  Abdominal: Soft. Bowel sounds are normal. She exhibits no distension. There is no tenderness.  Neurological: She is alert.  Skin: Skin is warm and dry.  Psychiatric:  Consented with sister, Helene Kelp, via phone  Nursing note and vitals reviewed.   Mallampati Score:  MD Evaluation Airway: Other (comments) Airway comments: trach Heart: WNL Abdomen: WNL Chest/ Lungs: WNL ASA  Classification: 3 Mallampati/Airway Score: Three  Imaging: Ct Angio Head W Or Wo Contrast  Result Date: 10/03/2016 CLINICAL DATA:  Left MCA infarct with hemorrhagic transformation. EXAM: CT ANGIOGRAPHY HEAD AND NECK TECHNIQUE: Multidetector CT imaging of the head and neck was performed using the standard protocol during bolus administration of intravenous contrast. Multiplanar CT image reconstructions and MIPs were obtained to evaluate the vascular anatomy. Carotid stenosis measurements (when applicable) are obtained utilizing NASCET criteria, using the distal internal carotid diameter as the denominator. CONTRAST:   50 mL Isovue 370 IV COMPARISON:  Head CT 10/01/2016 FINDINGS: CT HEAD FINDINGS Brain: Focus of intraparenchymal hemorrhage within the inferior left frontal lobe is unchanged in size measuring up to 2.2 cm. Extensive hypoattenuation within the left MCA distribution has increased from the prior study. Suspected small amount of left temporoparietal subarachnoid blood/petechial hemorrhage is unchanged. No midline shift. Mild mass effect on the left lateral ventricle is unchanged. Again noted is a cavum septum pellucidum et vergae. Review of the MIP images confirms the above findings CTA NECK FINDINGS Aortic arch: There is atherosclerotic calcification of the aortic arch. Proximal subclavian arteries are patent. Normal three-vessel branch pattern. Right carotid system: There is atherosclerotic calcification of the right carotid bifurcation without hemodynamically significant stenosis. Left carotid system: There is noncalcified plaque at the left carotid bifurcation, resulting in stenosis measuring 40%. Vertebral arteries: There is at least moderate narrowing of the right vertebral artery origin. There is also at least moderate narrowing of the left vertebral artery origin. There is moderate narrowing of the distal right V1 segment. There is multifocal atherosclerotic calcification along the course of both V2 segments without hemodynamically significant stenosis. The vertebral system is left dominant. There is a large amount of eccentric calcified plaque within the left V3 segment the causes at least 50% luminal narrowing. There is focal narrowing of the left V4 segment just proximal to the confluence with the basilar artery. Skeleton: No advanced bony spinal canal stenosis. No lytic or blastic osseous lesions. Other neck: Bilateral lens replacements. Near complete opacification of the left maxillary sinus. The patient is intubated. Upper chest: Negative Review of the MIP images confirms  the above findings CTA HEAD  FINDINGS Anterior circulation: --Intracranial internal carotid arteries: There is calcification of both proximal intracranial internal carotid arteries resulting in approximately 50% stenosis bilaterally. This is slightly worse on the left. --Anterior cerebral arteries: Normal. --Middle cerebral arteries: Normal. --Posterior communicating arteries: Absent bilaterally. Posterior circulation: --Posterior cerebral arteries: Normal. --Superior cerebellar arteries: Normal. --Basilar artery: Normal. --Anterior inferior cerebellar arteries: Normal. --Posterior inferior cerebellar arteries: Normal. Venous sinuses: As permitted by contrast timing, patent. Anatomic variants: None Delayed phase: No abnormal intracranial enhancement. Review of the MIP images confirms the above findings IMPRESSION: 1. Unchanged size of intraparenchymal hemorrhage within the left frontal lobe, with small amount of associated parietotemporal petechial or subarachnoid blood. No new mass effect or hydrocephalus. Hypoattenuation throughout the left MCA distribution is consistent with expected evolution. 2. No intracranial arterial occlusion. 3. 40% stenosis of the proximal left internal carotid artery secondary to noncalcified atherosclerotic plaque. 4. Bilateral vertebral artery origin narrowing that is at least moderate. 5. Multifocal mild-to-moderate narrowing along the courses of both vertebral arteries. 6. Aortic atherosclerosis. Electronically Signed   By: Ulyses Jarred M.D.   On: 10/03/2016 05:49   Dg Abd 1 View  Result Date: 10/07/2016 CLINICAL DATA:  Evaluate NG tube placement. EXAM: ABDOMEN - 1 VIEW COMPARISON:  10/02/2016 FINDINGS: The feeding tube tip is in the projecting over the lower abdomen. This is in the projection of the expected location of the descending duodenum. Bowel loops appear nondilated. IMPRESSION: 1. The tip of the feeding tube projects over the expected location of the descending duodenum. Electronically Signed    By: Kerby Moors M.D.   On: 10/07/2016 17:08   Ct Head Wo Contrast  Result Date: 10/01/2016 CLINICAL DATA:  Stroke. EXAM: CT HEAD WITHOUT CONTRAST TECHNIQUE: Contiguous axial images were obtained from the base of the skull through the vertex without intravenous contrast. COMPARISON:  09/30/2016 FINDINGS: Brain: An acute left MCA infarct is again seen. 2.2 cm focus of parenchymal hemorrhage in the left frontal lobe appears minimally larger than on the prior study (previously 2.0 cm). There is increased petechial hemorrhage in the posterior left temporal lobe. Less hypodense appearance of some portions of the infarct compared to yesterday's study may reflect a combination of reperfusion and petechial hemorrhage. The infarct again involves the left frontal, temporal, and parietal lobes as well as insula without evidence of significant interval extension. There is slightly increased effacement of the left lateral ventricle without significant midline shift or evidence of hydrocephalus. Overall cerebral volume is within normal limits for age. A cavum septum pellucidum et vergae is noted. Patchy cerebral white matter hypodensities bilaterally are unchanged and compatible with mild chronic small vessel ischemic disease. Vascular: Calcified atherosclerosis at the skullbase. Hyperdense left MCA branch vessel on the sylvian fissure as previously seen. Skull: No acute fracture. Mild diffuse skull heterogeneity with numerous subcentimeter lucencies, nonspecific. Sinuses/Orbits: Chronic sinusitis with unchanged partial left maxillary sinus opacification. Chronic defects in the anterior and posterior walls of the right frontal sinus. Clear mastoid air cells. Prior bilateral cataract extraction. Other: None. IMPRESSION: 1. Evolving acute left MCA infarct with hemorrhagic transformation. Mildly increased petechial hemorrhage and slightly increased size of 2.2 cm left frontal lobe parenchymal hematoma. 2. Slightly increased  mass effect without midline shift or hydrocephalus. Electronically Signed   By: Logan Bores M.D.   On: 10/01/2016 12:57   Ct Head Wo Contrast  Result Date: 09/30/2016 CLINICAL DATA:  Right-sided weakness EXAM: CT HEAD WITHOUT CONTRAST TECHNIQUE: Contiguous axial images  were obtained from the base of the skull through the vertex without intravenous contrast. COMPARISON:  MRI 07/24/2009 FINDINGS: Brain: Acute infarct in the left MCA territory. Low-density is seen in the insula extending into the left frontal temporal lobe and left parietal lobe. There is hemorrhagic transformation with blood in the left frontal temporal lobe and in the left parietal lobe. Generalized atrophy. Negative for hydrocephalus. Low-density in the parietal white matter bilaterally appears chronic. Brainstem and cerebellum intact. Vascular: Hyperdense vessel in the sylvian fissure compatible with thrombosed left MCA branch leading to acute infarct. M1 does not appear hyperdense. Skull: Negative Sinuses/Orbits: Mucosal edema in the paranasal sinuses. NG tube in place. Normal orbit. 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 No shift of the midline structures. Atrophy and chronic ischemic change in the white matter bilaterally These results were called by telephone at the time of interpretation on 09/30/2016 at 11:51 am to Dr. Rockne Menghini , who verbally acknowledged these results. Electronically Signed   By: Franchot Gallo M.D.   On: 09/30/2016 11:52   Ct Angio Neck W Or Wo Contrast  Result Date: 10/03/2016 CLINICAL DATA:  Left MCA infarct with hemorrhagic transformation. EXAM: CT ANGIOGRAPHY HEAD AND NECK TECHNIQUE: Multidetector CT imaging of the head and neck was performed using the standard protocol during bolus administration of intravenous contrast. Multiplanar CT image reconstructions and MIPs were obtained to evaluate the vascular anatomy. Carotid stenosis measurements (when applicable) are  obtained utilizing NASCET criteria, using the distal internal carotid diameter as the denominator. CONTRAST:  50 mL Isovue 370 IV COMPARISON:  Head CT 10/01/2016 FINDINGS: CT HEAD FINDINGS Brain: Focus of intraparenchymal hemorrhage within the inferior left frontal lobe is unchanged in size measuring up to 2.2 cm. Extensive hypoattenuation within the left MCA distribution has increased from the prior study. Suspected small amount of left temporoparietal subarachnoid blood/petechial hemorrhage is unchanged. No midline shift. Mild mass effect on the left lateral ventricle is unchanged. Again noted is a cavum septum pellucidum et vergae. Review of the MIP images confirms the above findings CTA NECK FINDINGS Aortic arch: There is atherosclerotic calcification of the aortic arch. Proximal subclavian arteries are patent. Normal three-vessel branch pattern. Right carotid system: There is atherosclerotic calcification of the right carotid bifurcation without hemodynamically significant stenosis. Left carotid system: There is noncalcified plaque at the left carotid bifurcation, resulting in stenosis measuring 40%. Vertebral arteries: There is at least moderate narrowing of the right vertebral artery origin. There is also at least moderate narrowing of the left vertebral artery origin. There is moderate narrowing of the distal right V1 segment. There is multifocal atherosclerotic calcification along the course of both V2 segments without hemodynamically significant stenosis. The vertebral system is left dominant. There is a large amount of eccentric calcified plaque within the left V3 segment the causes at least 50% luminal narrowing. There is focal narrowing of the left V4 segment just proximal to the confluence with the basilar artery. Skeleton: No advanced bony spinal canal stenosis. No lytic or blastic osseous lesions. Other neck: Bilateral lens replacements. Near complete opacification of the left maxillary sinus. The  patient is intubated. Upper chest: Negative Review of the MIP images confirms the above findings CTA HEAD FINDINGS Anterior circulation: --Intracranial internal carotid arteries: There is calcification of both proximal intracranial internal carotid arteries resulting in approximately 50% stenosis bilaterally. This is slightly worse on the left. --Anterior cerebral arteries: Normal. --Middle cerebral arteries: Normal. --Posterior communicating arteries: Absent bilaterally. Posterior circulation: --Posterior  cerebral arteries: Normal. --Superior cerebellar arteries: Normal. --Basilar artery: Normal. --Anterior inferior cerebellar arteries: Normal. --Posterior inferior cerebellar arteries: Normal. Venous sinuses: As permitted by contrast timing, patent. Anatomic variants: None Delayed phase: No abnormal intracranial enhancement. Review of the MIP images confirms the above findings IMPRESSION: 1. Unchanged size of intraparenchymal hemorrhage within the left frontal lobe, with small amount of associated parietotemporal petechial or subarachnoid blood. No new mass effect or hydrocephalus. Hypoattenuation throughout the left MCA distribution is consistent with expected evolution. 2. No intracranial arterial occlusion. 3. 40% stenosis of the proximal left internal carotid artery secondary to noncalcified atherosclerotic plaque. 4. Bilateral vertebral artery origin narrowing that is at least moderate. 5. Multifocal mild-to-moderate narrowing along the courses of both vertebral arteries. 6. Aortic atherosclerosis. Electronically Signed   By: Ulyses Jarred M.D.   On: 10/03/2016 05:49   Mr Brain Wo Contrast  Result Date: 10/04/2016 CLINICAL DATA:  Right-sided weakness.  Hemorrhagic infarct. EXAM: MRI HEAD WITHOUT CONTRAST TECHNIQUE: Multiplanar, multiecho pulse sequences of the brain and surrounding structures were obtained without intravenous contrast. COMPARISON:  Head CT and CTA from yesterday. FINDINGS: Brain:  Restricted diffusion throughout the large majority of the left MCA territory with superimposed petechial hemorrhage and anterior frontal parenchymal hematoma measuring 14 mm. Background of moderate chronic microvascular disease in the cerebral white matter. Expected cytotoxic edema causes effacement of left lateral ventricle. No midline shift or herniation. Vascular: Preserved major flow voids. Skull and upper cervical spine: Normal marrow signal. Sinuses/Orbits: Chronic maxillary sinusitis with atelectasis and wall sclerosis on the left. IMPRESSION: Acute infarct involving the large majority of the left MCA territory. Hemorrhagic conversion is stable compared to head CT from yesterday. Stable mass effect from cytotoxic edema; no midline shift. Electronically Signed   By: Monte Fantasia M.D.   On: 10/04/2016 15:10   Dg Chest Port 1 View  Result Date: 10/11/2016 CLINICAL DATA:  75 year old female with shortness of breath. Respiratory failure. Tracheostomy. Initial encounter. EXAM: PORTABLE CHEST 1 VIEW COMPARISON:  10/10/2016 and earlier. FINDINGS: Portable AP semi upright view at 1146 hours. Tracheostomy tube appears stable. A feeding tube courses to the abdomen, tip not included. Lung volumes are stable. Mild elevation of the left hemidiaphragm is stable. Stable cardiac size and mediastinal contours. Calcified aortic atherosclerosis. No pneumothorax, pulmonary edema or consolidation. Mild veiling opacity at the right lung base such that a small right pleural effusion is difficult to exclude. No acute osseous abnormality identified. IMPRESSION: 1.  Stable lines and tubes. 2. Stable ventilation with no focal pulmonary abnormality aside from a possible small right pleural effusion. Electronically Signed   By: Genevie Ann M.D.   On: 10/11/2016 11:57   Dg Chest Port 1 View  Result Date: 10/10/2016 CLINICAL DATA:  Acute respiratory failure with hypoxemia EXAM: PORTABLE CHEST 1 VIEW COMPARISON:  Yesterday FINDINGS:  Nasogastric in place of feeding tube, at least reaches the stomach. Left upper extremity PICC with tip at the SVC level. Tracheostomy tube remains seated. COPD with chronic hyperinflation and apical lucency. Improving basilar aeration with mild residual pneumonia or atelectasis. Chronic cardiopericardial enlargement. No edema, effusion, or pneumothorax. IMPRESSION: 1. Unremarkable positioning of tubes and central line. 2. Improving basilar aeration. Electronically Signed   By: Monte Fantasia M.D.   On: 10/10/2016 07:13   Dg Chest Port 1 View  Result Date: 10/09/2016 CLINICAL DATA:  Respiratory failure EXAM: PORTABLE CHEST 1 VIEW COMPARISON:  October 08, 2016 FINDINGS: The feeding tube terminates below today's film. Other support apparatus  stable. No pneumothorax. Stable cardiomegaly. Support apparatus obscures the right base. Probable mild venous congestion with no definitive focal infiltrate. IMPRESSION: Stable support apparatus. Probable mild pulmonary venous congestion. Electronically Signed   By: Dorise Bullion III M.D   On: 10/09/2016 07:16   Dg Chest Port 1 View  Result Date: 10/08/2016 CLINICAL DATA:  ETT.  Respiratory failure. EXAM: PORTABLE CHEST 1 VIEW COMPARISON:  October 07, 2016 FINDINGS: A tracheostomy tube is again identified. A feeding tube terminates below today's film. No pneumothorax. Probable layering effusion on the right. No overt edema. Left basilar atelectasis. No other interval changes. IMPRESSION: Stable tracheostomy. Opacity in the right base is mildly more prominent, favored to represent layering effusion and underlying atelectasis. Recommend attention on follow-up. Mild atelectasis in the left base. Stable left PICC line. Electronically Signed   By: Dorise Bullion III M.D   On: 10/08/2016 08:10   Dg Chest Port 1 View  Result Date: 10/07/2016 CLINICAL DATA:  Evaluate tracheostomy. EXAM: PORTABLE CHEST 1 VIEW COMPARISON:  10/06/2016 FINDINGS: A tracheostomy replaces the  endotracheal tube. The tracheostomy tube tip projects in the upper thoracic trachea, well positioned. The nasogastric tube has been removed.  Left PICC is stable. There is opacity at the lung bases, right greater than left, consistent with a combination of pleural fluid and atelectasis. There is persistent pulmonary vascular congestion with no overt pulmonary edema. No pneumothorax. Cardiac silhouette is mildly enlarged but stable. IMPRESSION: 1. Well-positioned tracheostomy tube replacing the endotracheal tube. 2. Increased opacity at the right lung base likely due to an increase in pleural fluid. 3. No other change. Vascular congestion with no overt pulmonary edema. Mild left lung base atelectasis and probable small effusion. Electronically Signed   By: Lajean Manes M.D.   On: 10/07/2016 13:30   Dg Chest Port 1 View  Result Date: 10/06/2016 CLINICAL DATA:  Respiratory failure.  COPD. EXAM: PORTABLE CHEST 1 VIEW COMPARISON:  10/05/2016 FINDINGS: Endotracheal tube remains in place with tip approximately 2 cm above the carina. Enteric tube courses into the left upper abdomen with tip not imaged. The cardiac silhouette remains enlarged. Aortic atherosclerosis is noted. Lung volumes are minimally increased compared yesterday. Patchy bibasilar opacities have not significantly changed. No large pleural effusion or pneumothorax is identified. Mild central pulmonary vascular congestion is unchanged. IMPRESSION: 1. No significant interval change. Cardiomegaly, pulmonary vascular congestion, and patchy bibasilar opacities which may reflect atelectasis. 2. Aortic atherosclerosis. Electronically Signed   By: Logan Bores M.D.   On: 10/06/2016 08:05   Dg Chest Port 1 View  Result Date: 10/05/2016 CLINICAL DATA:  Respiratory failure. EXAM: PORTABLE CHEST 1 VIEW COMPARISON:  10/04/2016 FINDINGS: Endotracheal tube is 2.4 cm above the carina. PICC line tip in the SVC region. Heart remains enlarged. Patchy densities at both  lung bases. Negative for pneumothorax. Again noted is slight enlargement of central vascular structures. IMPRESSION: Endotracheal tube is appropriately positioned. Central vascular congestion with patchy basilar chest densities. Basilar chest densities may represent a combination of atelectasis and small effusions. Electronically Signed   By: Markus Daft M.D.   On: 10/05/2016 08:57   Dg Chest Port 1 View  Result Date: 10/04/2016 CLINICAL DATA:  Intubated EXAM: PORTABLE CHEST 1 VIEW COMPARISON:  Chest radiograph from one day prior. FINDINGS: Endotracheal tube tip is 5.5 cm above the carina. Enteric tube enters stomach with the tip not seen on this image. Left PICC terminates in the lower third of the superior vena cava. Stable cardiomediastinal silhouette with  mild cardiomegaly and aortic atherosclerosis. No pneumothorax. Probable trace bilateral pleural effusions. Mild pulmonary edema appears stable. IMPRESSION: 1. Support structures as described. 2. Stable mild congestive heart failure . 3. Probable trace bilateral pleural effusions. 4. Aortic atherosclerosis. Electronically Signed   By: Ilona Sorrel M.D.   On: 10/04/2016 09:21   Dg Chest Port 1 View  Result Date: 10/03/2016 CLINICAL DATA:  Intubation/vent EXAM: PORTABLE CHEST - 1 VIEW COMPARISON:  10/02/2016 FINDINGS: Endotracheal tube and left PICC remain in place. Nasogastric tube has been advanced at least as far as the stomach, tip not seen. Relatively low lung volumes with patchy bibasilar atelectasis or infiltrates slightly increased. Heart size upper limits normal for technique. Atheromatous aorta. Blunting of left lateral costophrenic angle suggesting small effusion. Visualized bones unremarkable. IMPRESSION: 1. Slight worsening of bibasilar infiltrates or atelectasis. 2.   nasogastric tube to the stomach. Electronically Signed   By: Lucrezia Europe M.D.   On: 10/03/2016 07:37   Portable Chest Xray  Result Date: 10/02/2016 CLINICAL DATA:   Endotracheal tube placement EXAM: PORTABLE CHEST 1 VIEW COMPARISON:  12/02/2015 FINDINGS: Cardiac shadow remains enlarged. An endotracheal tube is again noted approximately 2.8 cm above the carina in satisfactory position. The nasogastric catheter has been removed in the interval. A new left-sided PICC line is noted with the tip at cavoatrial junction. The lungs show some patchy atelectatic changes in the bases bilaterally IMPRESSION: Bibasilar atelectasis. Tubes and lines as described. Electronically Signed   By: Inez Catalina M.D.   On: 10/02/2016 15:17   Dg Chest Port 1 View  Result Date: 10/01/2016 CLINICAL DATA:  Respiratory failure EXAM: PORTABLE CHEST 1 VIEW COMPARISON:  09/30/2016 FINDINGS: Cardiac shadow is enlarged. Endotracheal tube and nasogastric catheter are noted. The endotracheal tube has been advanced somewhat and now lies approximately 1.7 cm above the carina. The lungs are well aerated bilaterally with mild patchy they basilar atelectasis. No new focal infiltrate is seen. No bony abnormality is noted. IMPRESSION: Mild bibasilar atelectasis new from the prior exam. No other focal abnormality is seen. Electronically Signed   By: Inez Catalina M.D.   On: 10/01/2016 07:21   Dg Chest Portable 1 View  Result Date: 09/30/2016 CLINICAL DATA:  Hypoxia with endotracheal tube adjustment EXAM: PORTABLE CHEST 1 VIEW COMPARISON:  Study obtained earlier in the day FINDINGS: Endotracheal tube tip is now 3.6 cm above the carina. No pneumothorax. There is airspace consolidation in the left base laterally. Lungs elsewhere are clear. Heart is mildly enlarged with pulmonary vascularity within normal limits. No adenopathy. There is atherosclerotic calcification in the aorta. No evident bone lesions. IMPRESSION: Endotracheal tube as described without pneumothorax. Left base airspace consolidation consistent with pneumonia. Lungs elsewhere clear. Stable cardiac silhouette. There is aortic atherosclerosis.  Electronically Signed   By: Lowella Grip III M.D.   On: 09/30/2016 09:36   Dg Chest Portable 1 View  Result Date: 09/30/2016 CLINICAL DATA:  75 year old female found unresponsive this morning. Intubated. Initial encounter. EXAM: PORTABLE CHEST 1 VIEW COMPARISON:  09/16/2015 and earlier. FINDINGS: Portable AP semi upright view at 0915 hours. Endotracheal tube tip in good position about 2.5 cm above the carina. Enteric tube placed, tip not definitely included. Somewhat low lung volumes. Mild patchy opacity at the left lung base. Stable cardiomegaly and mediastinal contours. Calcified aortic atherosclerosis. No pneumothorax, pulmonary edema, or definite pleural effusion. IMPRESSION: 1. ET tube in good position. Enteric tube courses to the left abdomen. 2. Lower lung volumes. Patchy left lung base  opacity could reflect atelectasis, but consider also aspiration or bronchopneumonia in this setting. 3.  Calcified aortic atherosclerosis. Electronically Signed   By: Genevie Ann M.D.   On: 09/30/2016 09:34   Dg Abd Portable 1v  Result Date: 10/10/2016 CLINICAL DATA:  Feeding tube placement EXAM: PORTABLE ABDOMEN - 1 VIEW COMPARISON:  Abdominal radiograph 10/08/2016 FINDINGS: The enteric tube courses beyond the field of view in the tip is not visualized adequately. There is bibasilar atelectasis. IMPRESSION: Inadequate visualization of enteric tube tip, which is beyond the field of view. Electronically Signed   By: Ulyses Jarred M.D.   On: 10/10/2016 16:09   Dg Abd Portable 1v  Result Date: 10/08/2016 CLINICAL DATA:  Evaluate enteric tube placement. EXAM: PORTABLE ABDOMEN - 1 VIEW COMPARISON:  Common radiograph 10/07/2016. FINDINGS: Enteric tube tip projects over the gastric antrum. Unremarkable bowel gas pattern. Lumbar spine degenerative changes. IMPRESSION: Enteric tube tip projects over the gastric antrum. Electronically Signed   By: Lovey Newcomer M.D.   On: 10/08/2016 09:59   Dg Abd Portable 1v  Result  Date: 10/02/2016 CLINICAL DATA:  Feeding tube placement. EXAM: PORTABLE ABDOMEN - 1 VIEW COMPARISON:  04/07/2015 FINDINGS: Nasogastric terminates in the right-sided side of the abdomen, either within the distal stomach or proximal duodenum. Nonobstructive bowel gas pattern. Cardiomegaly. IMPRESSION: Nasogastric terminating at the distal stomach or proximal duodenum. Electronically Signed   By: Abigail Miyamoto M.D.   On: 10/02/2016 16:30   Ct Angio Chest/abd/pel For Dissection W And/or Wo Contrast  Result Date: 09/30/2016 CLINICAL DATA:  Respiratory distress, right-sided weakness EXAM: CT ANGIOGRAPHY CHEST, ABDOMEN AND PELVIS TECHNIQUE: Multidetector CT imaging through the chest, abdomen and pelvis was performed using the standard protocol during bolus administration of intravenous contrast. Multiplanar reconstructed images and MIPs were obtained and reviewed to evaluate the vascular anatomy. CONTRAST:  100 mL Isovue 370 COMPARISON:  None. FINDINGS: CTA CHEST FINDINGS Cardiovascular: Preferential opacification of the thoracic aorta. No evidence of thoracic aortic aneurysm or dissection. Thoracic aortic atherosclerosis major vessels arising from the aortic arch are patent. Bilateral vertebral arteries are patent. Dense coronary artery atherosclerosis in the LAD, left main, and circumflex. Normal heart size. No pericardial effusion. Mediastinum/Nodes: No enlarged mediastinal, hilar, or axillary lymph nodes. Thyroid gland, trachea, and esophagus demonstrate no significant findings. Lungs/Pleura: Bibasilar atelectasis. Lingular airspace disease which may reflect atelectasis versus pneumonia. No pleural effusion or pneumothorax. Musculoskeletal: No acute osseous abnormality. Degenerative disc disease throughout the thoracic spine. No aggressive lytic or sclerotic osseous lesion. Degenerative disc disease with disc space narrowing of the lower cervical spine. Review of the MIP images confirms the above findings. CTA  ABDOMEN AND PELVIS FINDINGS VASCULAR Aorta: Normal caliber aorta without aneurysm, dissection, vasculitis or significant stenosis. Abdominal aortic atherosclerosis. Celiac: Patent without evidence of aneurysm, dissection, vasculitis or significant stenosis. Mild atherosclerotic plaque at the origin. SMA: Patent without evidence of aneurysm, dissection, vasculitis or significant stenosis. Mild atherosclerosis at the origin and slightly more distally. Renals: Both renal arteries are patent without evidence of aneurysm, dissection, vasculitis, fibromuscular dysplasia or significant stenosis. IMA: Patent without evidence of aneurysm, dissection, vasculitis or significant stenosis. Veins: No obvious venous abnormality within the limitations of this arterial phase study. Iliacs: Atherosclerotic disease involving bilateral common iliac arteries extending to the external iliac arteries. 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 Review of the MIP images confirms the above findings. NON-VASCULAR Hepatobiliary: 10 mm hypodensity in the right hepatic lobe of indeterminate etiology likely representing a small cyst unchanged from 05/20/2014. Liver is otherwise unremarkable. No gallstones, gallbladder wall thickening, or biliary dilatation. Pancreas: Unremarkable. No pancreatic ductal dilatation or surrounding inflammatory changes. Spleen: Normal in size without focal abnormality. Adrenals/Urinary Tract: Adrenal glands are unremarkable. Kidneys are normal, without renal calculi, focal lesion, or hydronephrosis. Bladder is decompressed with a Foley catheter present. Stomach/Bowel: Stomach is within normal limits. Sigmoid diverticulosis without evidence of diverticulitis. Appendix appears normal. No evidence of bowel wall  thickening, distention, or inflammatory changes. Lymphatic:  No lymphandeopathy. Reproductive: Status post hysterectomy. No adnexal masses. Other: Fat containing umbilical hernia.  No abdominopelvic ascites. Musculoskeletal: Nondisplaced fracture of the right superior and inferior pubic rami. 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 Review of the MIP images confirms the above findings. IMPRESSION: 1. No aortic aneurysm or dissection. 2. Diverticulosis without evidence diverticulitis. 3. Nondisplaced fracture of the right superior and inferior pubic rami. 4. 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 5.  Aortic Atherosclerosis (ICD10-170.0) 6. Severe coronary artery atherosclerosis. 7. Lingular airspace disease which may reflect atelectasis versus pneumonia. Electronically Signed   By: Kathreen Devoid   On: 09/30/2016 12:06    Labs:  CBC:  Recent Labs  10/09/16 0430 10/10/16 0320 10/11/16 0340 10/12/16 0317  WBC 8.1 10.0 15.0* 9.1  HGB 7.7* 11.9* 9.0* 7.5*  HCT 25.1* 38.5 29.7* 25.2*  PLT 164 144* 187 182    COAGS:  Recent Labs  09/30/16 0940 10/06/16 0900  INR 1.04 1.11  APTT 24 33    BMP:  Recent Labs  10/10/16 0320 10/11/16 0340 10/11/16 1805 10/12/16 0640  NA 151* 152* 148* 147*  K 3.3* 3.5 3.8 3.7  CL 116* 117* 113* 111  CO2 28 26 28 27   GLUCOSE 273* 248* 236* 202*  BUN 58* 48* 51* 48*  CALCIUM 9.4 9.6 9.3 9.3  CREATININE 1.45* 1.37* 1.33* 1.33*  GFRNONAA 34* 36* 38* 38*  GFRAA 39* 42* 44* 44*    LIVER FUNCTION TESTS:  Recent Labs  09/30/16 0940 10/02/16 0403 10/03/16 0351 10/11/16 0340  BILITOT 0.7 1.0 0.9 0.7  AST 30 34 45* 41  ALT 21 20 30  55*  ALKPHOS 66 46 53 112  PROT 6.5 5.7* 6.1* 6.1*  ALBUMIN 3.3*  2.7* 3.0* 2.3*    TUMOR MARKERS: No results for input(s): AFPTM, CEA, CA199, CHROMGRNA in the last 8760 hours.  Assessment and Plan:  Percutaneous gastric tube placement Patient with L MCA infarct with hemorrhage transformation.  Remains NPO.   Percutaneous gastric tube placement requested by Dr. Titus Mould.  Need for long-term care; dysphagia Scheduled for percutaneous gastric tube placement 11/16.  Risks and Benefits discussed with the patient's sister Helene Kelp) including, but not limited to the need for a barium enema during the procedure, bleeding, infection, peritonitis, or damage to adjacent structures. All of the patient's sister's questions were answered, she is agreeable to proceed. Consent signed and in chart.   Thank you for this interesting consult.  I greatly enjoyed meeting Ilithyia C Erney and look forward to participating in their care.  A copy of this report was sent to the requesting provider on this date.  Electronically Signed: Jerrian Mells A 10/12/2016, 12:27 PM   I spent a total of 40 Minutes   in face to face in clinical consultation, greater than 50% of which was counseling/coordinating care for percutaneous gastric tube placement.

## 2016-10-12 NOTE — Progress Notes (Addendum)
PULMONARY / CRITICAL CARE MEDICINE   Name: [name] MRN: [id number] DOB: 1941-01-05    ADMISSION DATE:  09/30/2016  REFERRING MD:  Dr. Cletus Gash, ER  CHIEF COMPLAINT:  Rt sided weakness.  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   CULTURES: Blood 11/03 >>neg Urine 11/03 >> neg  ANTIBIOTICS: Vancomycin 11/03 >> 11/03 Zosyn 11/03 >> 11/03   SIGNIFICANT EVENTS: 11/03 Admit, neuro consulted 11/04 Start 3% NS 11/05 Extubated >> reintubated 11/10 tracheostomy placement  LINES/TUBES: ETT 11/03 >> 11/05 ETT 11/05 >> 11/10 Lt PICC 11/05 >>   SUBJECTIVE:   Uncomfortable during trach cholar. Nods to pain but not specific where she hurts. She is on two point restraint.   VITAL SIGNS: BP 131/62   Pulse 64   Temp 98.2 F (36.8 C) (Oral)   Resp 14   Ht 5\' 7"  (1.702 m)   Wt 177 lb 11.1 oz (80.6 kg)   SpO2 100%   BMI 27.83 kg/m   VENTILATOR SETTINGS: Vent Mode: PSV;CPAP FiO2 (%):  [30 %] 30 % Set Rate:  [16 bmp] 16 bmp Vt Set:  [500 mL] 500 mL PEEP:  [5 cmH20] 5 cmH20 Pressure Support:  [12 cmH20] 12 cmH20 Plateau Pressure:  [19 cmH20-24  cmH20] 24 cmH20  INTAKE / OUTPUT:  Intake/Output Summary (Last 24 hours) at 10/12/16 [id number] Last data filed at 10/12/16 0800  Gross per 24 hour  Intake             3635 ml  Output              955 ml  Net             2680 ml    PHYSICAL EXAMINATION: General: appears comfortable except when providing trach care  Neuro: awake, opens eyes, intermittently follows command, appears to be expressive and receptive aphasia,  HEENT: tracheostomy in place, right sided facial droop  Cardiovascular:  RRR, no mgr Lungs:  On trach collar, scattered rhonchi, no accessory use  Abdomen:  Soft, BS+, soft, non tender Skin:  Chronic venous stasis changes  LABS:  BMET  Recent Labs Lab 10/11/16 0340 10/11/16 1805 10/12/16 0640  NA 152* 148* 147*  K 3.5 3.8 3.7  CL 117* 113* 111  CO2 26 28 27   BUN 48* 51* 48*  CREATININE 1.37* 1.33* 1.33*  GLUCOSE 248* 236* 202*    Electrolytes  Recent Labs Lab 10/06/16 0430  10/11/16 0340 10/11/16 1805 10/12/16 0640  CALCIUM 9.8  < > 9.6 9.3 9.3  MG 2.4  --   --   --   --   PHOS 2.9  --   --   --   --   < > =  values in this interval not displayed.  CBC  Recent Labs Lab 10/10/16 0320 10/11/16 0340 10/12/16 0317  WBC 10.0 15.0* 9.1  HGB 11.9* 9.0* 7.5*  HCT 38.5 29.7* 25.2*  PLT 144* 187 182    Coag's  Recent Labs Lab 10/06/16 0900  APTT 33  INR 1.11    Sepsis Markers No results for input(s): LATICACIDVEN, PROCALCITON, O2SATVEN in the last 168 hours.  ABG No results for input(s): PHART, PCO2ART, PO2ART in the last 168 hours.  Liver Enzymes  Recent Labs Lab 10/11/16 0340  AST 41  ALT 55*  ALKPHOS 112  BILITOT 0.7  ALBUMIN 2.3*    Cardiac Enzymes No results for input(s): TROPONINI, PROBNP in the last 168 hours.  Glucose  Recent Labs Lab 10/11/16 1123 10/11/16 1714 10/11/16 1934 10/11/16 2335 10/12/16 0345 10/12/16 0720  GLUCAP 262* 181* 210* 161* 168* 186*    ASSESSMENT / PLAN: Neuro:  A: Acute Lt MCA  infarct with hemorrhagic conversion - neurology signed off 11/08 - RASS goal 0  - PT/OT recommeds CIR - will likely need LTAC  CVS: A:  HTN emergency Acute on Chronic systolic CHF Slight brady  side P: -D/c PICC line -coreg 12.5 bid -apresoline maxed  -add nitrates (has sys chf) -add hctz  Resp: A: Compromised airway in setting of CVA. Hx of COPD/asthma Failed extubation attempt 11/05, continue tracheostomy management Some edema P: - Continue trach collar attempts to tid  - OOB - tracheostomy care - scheduled pulmicort, duoneb -lasix as able  GI: A: Dysphagia secondary to CVA P: -SLP eval again -need peg  -TF per Cortrak 55+30 cc/hr -Pepcid  Renal: A: Hypernatremia > improving AoCKD 2: improving pulm edema P: -d5w continued at 75, add lasix, hope for natruresis also - monitor renal fx, urine outpt -bmet in pm and am   MSK: Pubic rami fracture -Per orthopedics can weight bear as tolerated with walker -PT  Endo A: DM type II CBG in mid 200's P: - SSI with lantus -4 units q4h for TF coverage  Hem: A: Anemia P: DVT prophylaxis - SQ heparin, SCDs CBC every other day  Goals of care - Full code  STAFF NOTE: I, Merrie Roof, MD FACP have personally reviewed patient's available data, including medical history, events of note, physical examination and test results as part of my evaluation. I have discussed with resident/NP and other care providers such as pharmacist, RN and RRT. In addition, I personally evaluated patient and elicited key findings of: awake, int fc likely, more coarse BS, pcxr edema, had to correct Na, was pos balance, weaning poor, add lasix with free d5w, bmet in pm and am, trach collar trials tid 30 min with PS 10-12 in between, get peg, pt active, needs ltach, will cal duaghter on phone to update, await sdu bed, glu controlled, has chf, add nitrates with hydral, add hctz, some brady limiting  Lavon Paganini. Titus Mould, MD, [place] Pgr:  [facility] 10/12/2016 10:30 AM

## 2016-10-13 ENCOUNTER — Encounter (HOSPITAL_COMMUNITY): Payer: Self-pay | Admitting: Interventional Radiology

## 2016-10-13 ENCOUNTER — Inpatient Hospital Stay (HOSPITAL_COMMUNITY): Payer: Medicare Other

## 2016-10-13 DIAGNOSIS — E118 Type 2 diabetes mellitus with unspecified complications: Secondary | ICD-10-CM

## 2016-10-13 DIAGNOSIS — I5023 Acute on chronic systolic (congestive) heart failure: Secondary | ICD-10-CM

## 2016-10-13 DIAGNOSIS — I499 Cardiac arrhythmia, unspecified: Secondary | ICD-10-CM

## 2016-10-13 DIAGNOSIS — E1165 Type 2 diabetes mellitus with hyperglycemia: Secondary | ICD-10-CM

## 2016-10-13 DIAGNOSIS — IMO0002 Reserved for concepts with insufficient information to code with codable children: Secondary | ICD-10-CM

## 2016-10-13 DIAGNOSIS — L8990 Pressure ulcer of unspecified site, unspecified stage: Secondary | ICD-10-CM | POA: Insufficient documentation

## 2016-10-13 DIAGNOSIS — I498 Other specified cardiac arrhythmias: Secondary | ICD-10-CM

## 2016-10-13 HISTORY — PX: IR GENERIC HISTORICAL: IMG1180011

## 2016-10-13 LAB — BASIC METABOLIC PANEL
Anion gap: 6 (ref 5–15)
BUN: 45 mg/dL — AB (ref 6–20)
CALCIUM: 9.4 mg/dL (ref 8.9–10.3)
CO2: 31 mmol/L (ref 22–32)
CREATININE: 1.25 mg/dL — AB (ref 0.44–1.00)
Chloride: 107 mmol/L (ref 101–111)
GFR calc non Af Amer: 41 mL/min — ABNORMAL LOW (ref >60)
GFR, EST AFRICAN AMERICAN: 47 mL/min — AB (ref >60)
Glucose, Bld: 146 mg/dL — ABNORMAL HIGH (ref 65–99)
Potassium: 3.5 mmol/L (ref 3.5–5.1)
SODIUM: 144 mmol/L (ref 135–145)

## 2016-10-13 LAB — PROTIME-INR
INR: 0.94
Prothrombin Time: 12.6 seconds (ref 11.4–15.2)

## 2016-10-13 LAB — MAGNESIUM: MAGNESIUM: 2.2 mg/dL (ref 1.7–2.4)

## 2016-10-13 LAB — GLUCOSE, CAPILLARY
GLUCOSE-CAPILLARY: 121 mg/dL — AB (ref 65–99)
GLUCOSE-CAPILLARY: 105 mg/dL — AB (ref 65–99)
GLUCOSE-CAPILLARY: 66 mg/dL (ref 65–99)
Glucose-Capillary: 122 mg/dL — ABNORMAL HIGH (ref 65–99)
Glucose-Capillary: 130 mg/dL — ABNORMAL HIGH (ref 65–99)
Glucose-Capillary: 150 mg/dL — ABNORMAL HIGH (ref 65–99)
Glucose-Capillary: 207 mg/dL — ABNORMAL HIGH (ref 65–99)

## 2016-10-13 LAB — TROPONIN I: Troponin I: 0.04 ng/mL (ref <0.03)

## 2016-10-13 LAB — PHOSPHORUS: PHOSPHORUS: 3.8 mg/dL (ref 2.5–4.6)

## 2016-10-13 MED ORDER — DEXTROSE 50 % IV SOLN
25.0000 mL | Freq: Once | INTRAVENOUS | Status: AC
  Administered 2016-10-13: 25 mL via INTRAVENOUS
  Filled 2016-10-13: qty 50

## 2016-10-13 MED ORDER — LIDOCAINE HCL 1 % IJ SOLN
INTRAMUSCULAR | Status: AC
  Filled 2016-10-13: qty 20

## 2016-10-13 MED ORDER — GLUCAGON HCL RDNA (DIAGNOSTIC) 1 MG IJ SOLR
INTRAMUSCULAR | Status: AC | PRN
  Administered 2016-10-13: 1 mg via INTRAVENOUS

## 2016-10-13 MED ORDER — IOPAMIDOL (ISOVUE-300) INJECTION 61%
INTRAVENOUS | Status: AC
  Administered 2016-10-13: 20 mL
  Filled 2016-10-13: qty 50

## 2016-10-13 MED ORDER — MIDAZOLAM HCL 2 MG/2ML IJ SOLN
INTRAMUSCULAR | Status: AC | PRN
  Administered 2016-10-13 (×2): 1 mg via INTRAVENOUS

## 2016-10-13 MED ORDER — DEXTROSE 5 % IV SOLN: INTRAVENOUS | Status: AC

## 2016-10-13 MED ORDER — CEFAZOLIN SODIUM-DEXTROSE 2-4 GM/100ML-% IV SOLN
2.0000 g | INTRAVENOUS | Status: AC
  Administered 2016-10-13: 2 g via INTRAVENOUS
  Filled 2016-10-13: qty 100

## 2016-10-13 MED ORDER — FENTANYL CITRATE (PF) 100 MCG/2ML IJ SOLN
INTRAMUSCULAR | Status: AC | PRN
  Administered 2016-10-13 (×2): 50 ug via INTRAVENOUS

## 2016-10-13 MED ORDER — MIDAZOLAM HCL 2 MG/2ML IJ SOLN
INTRAMUSCULAR | Status: AC
  Filled 2016-10-13: qty 2

## 2016-10-13 MED ORDER — FENTANYL CITRATE (PF) 100 MCG/2ML IJ SOLN
INTRAMUSCULAR | Status: AC
  Filled 2016-10-13: qty 2

## 2016-10-13 MED ORDER — CEFAZOLIN SODIUM-DEXTROSE 2-4 GM/100ML-% IV SOLN
INTRAVENOUS | Status: AC
  Administered 2016-10-13: 2 g via INTRAVENOUS
  Filled 2016-10-13: qty 100

## 2016-10-13 MED ORDER — LIDOCAINE HCL 1 % IJ SOLN
INTRAMUSCULAR | Status: AC | PRN
  Administered 2016-10-13: 10 mL

## 2016-10-13 MED ORDER — GLUCAGON HCL RDNA (DIAGNOSTIC) 1 MG IJ SOLR
INTRAMUSCULAR | Status: AC
  Filled 2016-10-13: qty 1

## 2016-10-13 MED ORDER — DEXTROSE 50 % IV SOLN
INTRAVENOUS | Status: AC
  Administered 2016-10-13: 25 mL via INTRAVENOUS
  Filled 2016-10-13: qty 50

## 2016-10-13 NOTE — Sedation Documentation (Signed)
Patient is resting comfortably. 

## 2016-10-13 NOTE — Progress Notes (Signed)
PROGRESS NOTE    [name]  ZWC:[id number] DOB: [birth year] DOA: 09/30/2016 PCP: Leola Brazil, MD   Brief Narrative:  75 yo BF PMHx  Anxiety, CAD native artery S/P multiple stent LAD , Dysrhythmia, Heart murmur, HTN, MI, PVD,Pericardial effusion, COPD,Chronic bronchitis , Anemia of chronic illness Cirrhosis of liver without mention of alcohol,,Chronic back pain,Type II diabetes mellitus uncontrolled with complication,   Noted to be making gurgling noises by family and was not moving right side.  Last seen at baseline prior to going to bed on 11/02.  She had blood pressure of 224/113.  She was given labetalol.  There was concern for airway protection and she required intubation.  She had elevated lactic acid and given Abx.  She was also given solumedrol and neb tx.  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    Subjective: 11/16 patient's eyes open patient alert nods yes and no to questions. Follows commands. Moving all extremities to commands. Indicates having right chest pain/neck pain. Negative SOB. NOTE now back on vent. Patient was on trach collar for~60 minutes and during that time began to have frequent PVCs    Assessment & Plan:   Active Problems:   CVA (cerebral vascular accident) ([place])   Pneumonia of left lower lobe due to infectious organism [facility])   Acute respiratory failure with hypoxemia ([place])   Arterial ischemic stroke, MCA (middle cerebral artery), left, acute ([place])   Cerebrovascular accident (CVA) due to embolism of left middle cerebral artery ([place])   Hemorrhagic stroke ([place])   Chronic combined systolic and diastolic heart failure (HCC)   Ventilator  dependence ([place])   Tracheostomy status ([place])   Closed fracture of multiple pubic rami, right, sequela   Diverticulosis of intestine without bleeding   Diabetes (HCC)   Chronic obstructive pulmonary disease ([place])   Coronary artery disease involving native coronary artery of native heart without angina pectoris   Benign essential HTN   Mixed hyperlipidemia   Tachypnea   Hypernatremia   Hypokalemia   Hypoalbuminemia due to protein-calorie malnutrition (HCC)   Acute on chronic renal insufficiency   Lymphocytosis   Acute blood loss anemia   Macrocytic anemia   Pressure injury of skin   Acute on chronic systolic CHF (congestive heart failure) ([place])   Sinus arrhythmia   Uncontrolled type 2 diabetes mellitus with complication ([place])   Acute Lt MCA infarct with hemorrhagic conversion - neurology signed off 11/08 - PT/OT recommeds CIR - will likely need LTAC -Patient currently alert and following commands  Acute on Chronic systolic CHF -Strict in and out -Daily weight Filed Weights   10/11/16 0100 10/12/16 0303 10/13/16 0400  Weight: 81.2 kg (179 lb 0.2 oz) 80.6 kg (177 lb 11.1 oz) 83 kg (182 lb 15.7 oz)  -coreg 12.5 bid -Lasix 40 mg BID -Hydralazine 100 mg TID -HCTZ 25 mg daily -Transfuse for hemoglobin<8  Pulmonary hypertension -See CHF  HTN emergency -See CHF  Sinus arrhythmia/PVC -Patient with frequent PVC when on trach collar. Reverts to sinus arrhythmia when placed back on vent. Most likely demand ischemia. Although May be secondary to PE as patient has complete occlusion of Lt external iliac artery. Unfortunately would not be able to anticoagulate secondary to hemorrhagic conversion  of CVA -Obtain Troponin history of MI with CHF.: Mildly elevated.  Compromised airway in setting of CVA. -Continue to wean per PCCM protocol  COPD/asthma -Failed extubation attempt 11/05, continue tracheostomy management - Continue trach collar attempts to tid  - OOB - tracheostomy  care - scheduled pulmicort, duoneb -lasix as able  Dysphagia secondary to CVA -TF per Cortrak 55+30 cc/hr -11/16 S/P PEG tube placement. Should twice a day use PEG tube on 11/17  Hypernatremia -Resolved  Acute on chronic renal failure(Baseline Cr 1.0-1.2) Lab Results  Component Value Date   CREATININE 1.25 (H) 10/13/2016   CREATININE 1.22 (H) 10/12/2016   CREATININE 1.33 (H) 10/12/2016  - continue D5 W to 56m/hr  Pubic rami fracture -Per orthopedics can weight bear as tolerated with walker -PT: Recommends CIR  DM type II uncontrolled with complication -157/8Hemoglobin A1c =8.1  -Lantus 22 units daily -NovoLog 4 units q 4hr -Resistant SSI  Anemia  Recent Labs Lab 10/08/16 0342 10/09/16 0430 10/10/16 0320 10/11/16 0340 10/12/16 0317  HGB 8.3* 7.7* 11.9* 9.0* 7.5*        DVT prophylaxis:  SQ heparin, SCDs Code Status: Full Family Communication: None Disposition Plan: CIR vs SNF   Consultants:  PCCM    Procedures/Significant Events:  CT head 11/03 >> Acute Lt MCA infarct with hemorrhagic conversion 11/03 Admit, neuro consulted CT angio chest/abd/pelvis 11/03 >> atelectasis, diverticulosis, non displaced fx of Rt superior and inferior pubic rami, complete occlusion of Lt external iliac artery, aneurysmal dilation of Lt common iliac, atherosclerosis 11/04 Start 3% NS 11/05 Extubated >> reintubated 11/6 Echocardiogram: LVEF=: 40% to 45%. Akinesis mid-apicalanteroseptal, anterior, anterolateral, lateral,  inferolateral, inferior, inferoseptal, and apical myocardium. -  (grade 1 diastolic dysfunction). - Pulmonary arteries: PA peak pressure: 51 mm Hg (S). - Pericardium, extracardiac: small pericardial effusion,no evidence hemodynamic compromise. MRI brain 11/07 >> acute infarct Lt MCA with hemorrhagic conversion, cytotoxic edema 11/10 tracheostomy placement 11/16 S/P PEG tube placement    VENTILATOR SETTINGS:    Cultures Blood 11/03  >>neg Urine 11/03 >> neg   Antimicrobials: Vancomycin 11/03 >> 11/03 Zosyn 11/03 >> 11/03    Devices    LINES / TUBES:  ETT 11/03 >> 11/05 ETT 11/05 >> 11/10 Lt PICC 11/05 >> 11/10 tracheostomy>>   Continuous Infusions: . dextrose 50 mL (10/13/16 2022)  . feeding supplement (GLUCERNA 1.2 CAL) Stopped (10/13/16 0000)     Objective: Vitals:   10/13/16 1900 10/13/16 1937 10/13/16 1944 10/13/16 2000  BP: 119/65   137/88  Pulse: 64 79  69  Resp: 19 16  16   Temp:   99.3 F (37.4 C)   TempSrc:   Oral   SpO2: 100% 100%  100%  Weight:      Height:        Intake/Output Summary (Last 24 hours) at 10/13/16 2111 Last data filed at 10/13/16 1900  Gross per 24 hour  Intake          1165.83 ml  Output             2145 ml  Net          -979.17 ml   Filed Weights   10/11/16 0100 10/12/16 0303 10/13/16 0400  Weight: 81.2 kg (179 lb 0.2 oz) 80.6 kg (177 lb 11.1 oz) 83 kg (182 lb 15.7 oz)    Examination:  General: patient's eyes open patient alert nods yes and no to questions. Follows commands., Positive acute on chronic respiratory distress Eyes: negative scleral hemorrhage,  negative anisocoria, negative icterus ENT: Negative Runny nose, negative gingival bleeding, Neck:  Negative scars, masses, torticollis, lymphadenopathy, JVD,  tracheostomy in place, negative sign of infection. Lungs: Clear to auscultation bilaterally without wheezes or crackles Cardiovascular: Sinus arrhythmia, without murmur gallop or rub normal S1 and S2 Abdomen: negative abdominal pain, nondistended, positive soft, bowel sounds, no rebound, no ascites, no appreciable mass Extremities: No significant cyanosis, clubbing, or edema bilateral lower extremities. Positive amputation left metatarsals Skin: Negative rashes, lesions, ulcers Psychiatric:  Unable to fully assess  Central nervous system:  Patient moves extremities to command, nods yes and no to questions.   .     Data Reviewed: Care during  the described time interval was provided by me .  I have reviewed this patient's available data, including medical history, events of note, physical examination, and all test results as part of my evaluation. I have personally reviewed and interpreted all radiology studies.  CBC:  Recent Labs Lab 10/08/16 0342 10/09/16 0430 10/10/16 0320 10/11/16 0340 10/12/16 0317  WBC 6.6 8.1 10.0 15.0* 9.1  NEUTROABS  --  5.8 7.8*  --   --   HGB 8.3* 7.7* 11.9* 9.0* 7.5*  HCT 27.0* 25.1* 38.5 29.7* 25.2*  MCV 100.4* 99.6 98.7 100.3* 101.2*  PLT 161 164 144* 187 366   Basic Metabolic Panel:  Recent Labs Lab 10/11/16 0340 10/11/16 1805 10/12/16 0640 10/12/16 1821 10/13/16 0345  NA 152* 148* 147* 134* 144  K 3.5 3.8 3.7 3.4* 3.5  CL 117* 113* 111 101 107  CO2 26 28 27 25 31   GLUCOSE 248* 236* 202* 547* 146*  BUN 48* 51* 48* 45* 45*  CREATININE 1.37* 1.33* 1.33* 1.22* 1.25*  CALCIUM 9.6 9.3 9.3 8.3* 9.4  MG  --   --   --   --  2.2  PHOS  --   --   --   --  3.8   GFR: Estimated Creatinine Clearance: 42.4 mL/min (by C-G formula based on SCr of 1.25 mg/dL (H)). Liver Function Tests:  Recent Labs Lab 10/11/16 0340  AST 41  ALT 55*  ALKPHOS 112  BILITOT 0.7  PROT 6.1*  ALBUMIN 2.3*   No results for input(s): LIPASE, AMYLASE in the last 168 hours. No results for input(s): AMMONIA in the last 168 hours. Coagulation Profile:  Recent Labs Lab 10/13/16 0345  INR 0.94   Cardiac Enzymes:  Recent Labs Lab 10/13/16 0905  TROPONINI 0.04*   BNP (last 3 results) No results for input(s): PROBNP in the last 8760 hours. HbA1C: No results for input(s): HGBA1C in the last 72 hours. CBG:  Recent Labs Lab 10/13/16 0841 10/13/16 1213 10/13/16 1514 10/13/16 1946 10/13/16 2014  GLUCAP 121* 207* 150* 66 105*   Lipid Profile: No results for input(s): CHOL, HDL, LDLCALC, TRIG, CHOLHDL, LDLDIRECT in the last 72 hours. Thyroid Function Tests: No results for input(s): TSH, T4TOTAL,  FREET4, T3FREE, THYROIDAB in the last 72 hours. Anemia Panel: No results for input(s): VITAMINB12, FOLATE, FERRITIN, TIBC, IRON, RETICCTPCT in the last 72 hours. Urine analysis:    Component Value Date/Time   COLORURINE AMBER (A) 10/08/2016 1716   APPEARANCEUR TURBID (A) 10/08/2016 1716   LABSPEC 1.012 10/08/2016 1716   PHURINE 5.5 10/08/2016 1716   GLUCOSEU NEGATIVE 10/08/2016 1716   HGBUR MODERATE (A) 10/08/2016 1716   BILIRUBINUR NEGATIVE 10/08/2016 1716   KETONESUR NEGATIVE 10/08/2016 1716   PROTEINUR 100 (A) 10/08/2016 1716   UROBILINOGEN 0.2 04/15/2014 1208   NITRITE NEGATIVE  10/08/2016 1716   LEUKOCYTESUR LARGE (A) 10/08/2016 1716   Sepsis Labs: '@LABRCNTIP'$ (procalcitonin:4,lacticidven:4)  )No results found for this or any previous visit (from the past 240 hour(s)).       Radiology Studies: Ir Gastrostomy Tube Mod Sed  Result Date: 10/13/2016 INDICATION: History of left MCA infarct with hemorrhagic transformation, now with altered mental status. As such, request made for placement of a gastrostomy tube for long-term enteric nutrition supplementation. EXAM: PULL TROUGH GASTROSTOMY TUBE PLACEMENT COMPARISON:  None. MEDICATIONS: Ancef 2 gm IV; Antibiotics were administered within 1 hour of the procedure. Glucagon 1 mg IV CONTRAST:  20 cc Isovue 300 administered into the gastric lumen. ANESTHESIA/SEDATION: Moderate (conscious) sedation was employed during this procedure. A total of Versed 2 mg and Fentanyl 100 mcg was administered intravenously. Moderate Sedation Time: 40 minutes. The patient's level of consciousness and vital signs were monitored continuously by radiology nursing throughout the procedure under my direct supervision. FLUOROSCOPY TIME:  4 minutes 12 seconds (923 mGy) COMPLICATIONS: None immediate. PROCEDURE: Informed written consent was obtained from the patient's sister following explanation of the procedure, risks, benefits and alternatives. A time out was performed  prior to the initiation of the procedure. Ultrasound scanning was performed to demarcate the edge of the left lobe of the liver. Maximal barrier sterile technique utilized including caps, mask, sterile gowns, sterile gloves, large sterile drape, hand hygiene and Betadine prep. The left upper quadrant was sterilely prepped and draped. An oral gastric catheter was inserted into the stomach under fluoroscopy. The existing nasogastric feeding tube was removed. The left costal margin and air opacified transverse colon were identified and avoided. Air was injected into the stomach for insufflation and visualization under fluoroscopy. Under sterile conditions a 17 gauge trocar needle was utilized to access the stomach percutaneously beneath the left subcostal margin after the overlying soft tissues were anesthetized with 1% Lidocaine with epinephrine. Needle position was confirmed within the stomach with aspiration of air and injection of small amount of contrast. A single T tack was deployed for gastropexy. Over an Amplatz guide wire, a 9-French sheath was inserted into the stomach. A snare device was utilized to capture the oral gastric catheter. The snare device was pulled retrograde from the stomach up the esophagus and out the oropharynx. The 20-French pull-through gastrostomy was connected to the snare device and pulled antegrade through the oropharynx down the esophagus into the stomach and then through the percutaneous tract external to the patient. The gastrostomy was assembled externally. Contrast injection confirms position in the stomach. Several spot radiographic images were obtained in various obliquities for documentation. The patient tolerated procedure well without immediate post procedural complication. FINDINGS: After successful fluoroscopic guided placement, the gastrostomy tube is appropriately positioned with internal disc against the ventral aspect of the gastric lumen. IMPRESSION: Successful  fluoroscopic insertion of a 20-French pull-through gastrostomy tube. The gastrostomy may be used immediately for medication administration and in 24 hrs for the initiation of feeds. Electronically Signed   By: Sandi Mariscal M.D.   On: 10/13/2016 15:02        Scheduled Meds: . budesonide (PULMICORT) nebulizer solution  0.5 mg Nebulization BID  . carvedilol  12.5 mg Oral BID WC  . chlorhexidine gluconate (MEDLINE KIT)  15 mL Mouth Rinse BID  . docusate  100 mg Oral BID  . famotidine  20 mg Per Tube Daily  . feeding supplement (PRO-STAT SUGAR FREE 64)  30 mL Per Tube Daily  . fentaNYL      .  furosemide  40 mg Intravenous BID  . glucagon (human recombinant)      . [START ON 10/14/2016] heparin subcutaneous  5,000 Units Subcutaneous Q8H  . hydrALAZINE  100 mg Per Tube Q8H  . hydrochlorothiazide  25 mg Per Tube Daily  . insulin aspart  0-20 Units Subcutaneous Q4H  . insulin aspart  4 Units Subcutaneous Q4H  . insulin glargine  22 Units Subcutaneous Daily  . lidocaine      . mouth rinse  15 mL Mouth Rinse QID  . midazolam      . sodium chloride flush  10-40 mL Intracatheter Q12H   Continuous Infusions: . dextrose 50 mL (10/13/16 2022)  . feeding supplement (GLUCERNA 1.2 CAL) Stopped (10/13/16 0000)     LOS: 13 days    Time spent: 40 minutes    Masud Holub, Geraldo Docker, MD [facility] Pager [phone]   If 7PM-7AM, please contact night-coverage [email or web address] Password [email or web address] 10/13/2016, 9:11 PM

## 2016-10-13 NOTE — Care Management Note (Signed)
Case Management Note Original note created by Ellan Lambert 10/06/16  Patient Details  Name: [name] MRN: [id number] Date of Birth: [birth year]  Subjective/Objective:  Pt admitted on 09/30/16 s/p Lt MCA infarct.  PTA, pt independent, lives with family members.                    Action/Plan: Pt remains intubated currently.  Planning possible tracheostomy on Friday, 11/10.  Will follow progress.    Expected Discharge Date:                  Expected Discharge Plan:  Long Term Acute Care (LTAC)  In-House Referral:     Discharge planning Services  CM Consult  Post Acute Care Choice:    Choice offered to:     DME Arranged:    DME Agency:     HH Arranged:    HH Agency:     Status of Service:  In process, will continue to follow  If discussed at Long Length of Stay Meetings, dates discussed:    Additional Comments: 10/13/2016   10/12/16 Attending discussed and answered questions of sister and [facility] has been officially [facility].  [facility] has began [employer].  Discussed in LOS 10/13/16; remains appropriate for continued stay  CM again requested attending to consult with pt's sister so that determination of LTACH could be made.  Attempt at contact was made by attending however sister was unreachable via phone - will continue to try.  Pt is scheduled for PEG tube placement tomorrow  10/11/16 [facility] liason met with sister at bedside.  Sister requested attending conference prior to choosing which facility - attending paged  Discussed in LOS 11/14:  Pt remains appropriate for continued stay and deemed appropriate for [facility] referral.  CM provided referral to both [facility] and [facility].  CM spoke with both brother Collins [place] and sister Helene Kelp and choice for LTACH was given.  Sister immediately chose [facility] due to location - sister is in agreement to meet with [facility] today at 1pm.  CM will inform [facility] of selection.  Pt stays with sister and per sister; pt was completely independent  prior to admit   10/10/16 Pt transferred to [facility] over weekend.  Pt trached 10/07/16 - remains on ventilator 30%, sedation discontinued earlier this am, tube feeds on hold awaiting access - may need PEG at some point.  Creatinine rising.  Attending requested LTACH referral - CM called referral in to physician advisor to determine appropriateness.  PT recommending CIR; however pt is not currently participating in therapies

## 2016-10-13 NOTE — Progress Notes (Signed)
Placed on Trach collar at 40% at this time per MD written order, RT to monitor. RN aware

## 2016-10-13 NOTE — Progress Notes (Signed)
PULMONARY / CRITICAL CARE MEDICINE   Name: [name] MRN: [id number] DOB: Apr 17, 1941    ADMISSION DATE:  09/30/2016  REFERRING MD:  Dr. Cletus Gash, ER  CHIEF COMPLAINT:  Rt sided weakness.  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   CULTURES: Blood 11/03 >>neg Urine 11/03 >> neg  ANTIBIOTICS: Vancomycin 11/03 >> 11/03 Zosyn 11/03 >> 11/03   SIGNIFICANT EVENTS: 11/03 Admit, neuro consulted 11/04 Start 3% NS 11/05 Extubated >> reintubated 11/10 tracheostomy placement  LINES/TUBES: ETT 11/03 >> 11/05 ETT 11/05 >> 11/10 Lt PICC 11/05 >>   SUBJECTIVE:   Uncomfortable during trach cholar. Nods to pain but not specific where she hurts. She is on two point restraint.   VITAL SIGNS: BP (!) 124/55   Pulse 63   Temp 98.4 F (36.9 C) (Oral)   Resp (!) 21   Ht 5\' 7"  (1.702 m)   Wt 182 lb 15.7 oz (83 kg)   SpO2 98%   BMI 28.66 kg/m   VENTILATOR SETTINGS: Vent Mode: PRVC FiO2 (%):  [30 %-40 %] 40 % Set Rate:  [16 bmp] 16 bmp Vt Set:  [500 mL] 500 mL PEEP:  [5 cmH20] 5 cmH20 Plateau Pressure:  [19 cmH20-23 cmH20] 20 cmH20  INTAKE /  OUTPUT:  Intake/Output Summary (Last 24 hours) at 10/13/16 1046 Last data filed at 10/13/16 0800  Gross per 24 hour  Intake             2375 ml  Output             2175 ml  Net              200 ml    PHYSICAL EXAMINATION: General: appears comfortable except when providing trach care  Neuro: awake, opens eyes, intermittently follows command, appears to be expressive and receptive aphasia,  HEENT: tracheostomy in place, right sided facial droop  Cardiovascular:  RRR, no mgr Lungs:  On trach collar, scattered rhonchi, no accessory use  Abdomen:  Soft, BS+, soft, non tender Skin:  Chronic venous stasis changes  LABS:  BMET  Recent Labs Lab 10/12/16 0640 10/12/16 1821 10/13/16 0345  NA 147* 134* 144  K 3.7 3.4* 3.5  CL 111 101 107  CO2 27 25 31   BUN 48* 45* 45*  CREATININE 1.33* 1.22* 1.25*  GLUCOSE 202* 547* 146*    Electrolytes  Recent Labs Lab 10/12/16 0640 10/12/16 1821 10/13/16 0345  CALCIUM 9.3 8.3* 9.4  MG  --   --  2.2  PHOS  --   --  3.8    CBC  Recent Labs Lab 10/10/16 0320 10/11/16 0340 10/12/16 0317  WBC 10.0 15.0* 9.1  HGB 11.9* 9.0* 7.5*  HCT  38.5 29.7* 25.2*  PLT 144* 187 182    Coag's  Recent Labs Lab 10/13/16 0345  INR 0.94    Sepsis Markers No results for input(s): LATICACIDVEN, PROCALCITON, O2SATVEN in the last 168 hours.  ABG No results for input(s): PHART, PCO2ART, PO2ART in the last 168 hours.  Liver Enzymes  Recent Labs Lab 10/11/16 0340  AST 41  ALT 55*  ALKPHOS 112  BILITOT 0.7  ALBUMIN 2.3*    Cardiac Enzymes  Recent Labs Lab 10/13/16 0905  TROPONINI 0.04*    Glucose  Recent Labs Lab 10/12/16 1109 10/12/16 1534 10/12/16 1921 10/12/16 2353 10/13/16 0345 10/13/16 0841  GLUCAP 258* 193* 218* 192* 130* 121*    ASSESSMENT / PLAN:  Acute Lt MCA infarct with hemorrhagic conversion HTN emergency Acute on Chronic systolic CHF Slight brady  side Compromised airway in setting of  CVA. Tracheostomy status  Hx of COPD/asthma Tracheostomy status  Dysphagia secondary to CVA Hypernatremia > improving AoCKD 2: improving pulm edema Pubic rami fracture DM type II Anemia  Discussion This is a 75 year old female s/p Left MCA stroke w/ hemorrhagic conversion. She is now s/p trach d/t inability to protect airway. We are working on weaning from vent. Additional significant barriers currently include: both expressive and receptive aphasia, probable dysphagia, inability to protect airway and deconditioning. She is getting PEG today (11/16). Should eventually come off vent. Decision to decannulate will depend on how she does over then next several weeks to months.  Discussed LTAC w/ sister on 11/16. [facility]  Plan Continue weaning and mobilizing.   Cleared for L TAC   10/13/2016 10:46 AM   STAFF NOTE: Linwood Dibbles, MD FACP have personally reviewed patient's available data, including medical history, events of note, physical examination and test results as part of my evaluation. I have discussed with resident/NP and other care providers such as pharmacist, RN and RRT. In addition, I personally evaluated patient and elicited key findings of: awake, post peg, no bleeding at site, coarse bs, this am did trach collar 1.5 hours that is better then 30 min day prior, can push for bid, then PS 10-12 in between mandatory daily, she also needs neg balance further with lasix now that Na is corrected, chem in am needed  Lavon Paganini. Titus Mould, MD, [place] Pgr: [facility] 10/13/2016 11:39 AM

## 2016-10-13 NOTE — Care Management Important Message (Signed)
Important Message  Patient Details  Name: SOLINA BAILOR MRN: [id number] Date of Birth: [birth year]   Medicare Important Message Given:  Yes    Teruko Joswick Abena 10/13/2016, 9:25 AM

## 2016-10-13 NOTE — Progress Notes (Signed)
Pt on trach collar trial, having more frequent PVC's and tachypneic. MD notified and orders received to place patient back on ventilator. RT notified.

## 2016-10-13 NOTE — Progress Notes (Signed)
CRITICAL VALUE ALERT    Critical value received:  0.04  Date of notification: 10/13/16     Time of notification:  [id number]    Critical value read back:Yes.    Nurse who received alert: Mickle Mallory   MD notified (1st page):  Dr. Sherral Hammers   Time of first page:  1030

## 2016-10-13 NOTE — Progress Notes (Signed)
Pt. Was transported to IR & back to 2M15 without any complications.

## 2016-10-13 NOTE — Progress Notes (Signed)
Occupational Therapy Treatment - late entry Patient Details Name: JAYDENCE MONTANO MRN: [id number] DOB: [birth year] Today's Date: 10/13/2016    History of present illness Patient is a 75 y/o female with hx of DM, PVD, PAD, MI, HTN, gout, COPD, CAD, lower limb ischemia presents with right sided weakness. 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 Found to have Nondisplaced fracture of the right superior and inferior pubic rami. 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    OT comments  Pt is progressing toward OT goals.  She demonatrates significant communication deficits.  She was able to sit EOB with max, progressing to min A.  She initially pushes heavily to Rt.  She demonstrates Lt gaze preference with Rt inattention.  She requires mod - max A for simple grooming.  VSS.  Recommend LTACH  Follow Up Recommendations  LTACH    Equipment Recommendations  3 in 1 bedside comode;Wheelchair (measurements OT);Wheelchair cushion (measurements OT);Hospital bed    Recommendations for Other Services      Precautions / Restrictions Precautions Precautions: Fall Precaution Comments: wrist restraints; vent       Mobility Bed Mobility Overal bed mobility: Needs Assistance;+2 for physical assistance Bed Mobility: Rolling;Supine to Sit;Sit to Supine Rolling: Max assist   Supine to sit: Max assist;+2 for physical assistance;+2 for safety/equipment;HOB elevated Sit to supine: Max assist;+2 for physical assistance;+2 for safety/equipment   General bed mobility comments: Patient able to perform some movement of LE and able to reacah across with UE and multi-modal cues  Transfers Overall transfer level: Needs assistance Equipment used: 2 person hand held assist (with wrap around support) Transfers: Sit to/from Stand Sit to Stand: Mod assist;+2  physical assistance (max assist to maintain upright)         General transfer comment: Patient able to power up with increased assist +2, patient with instability in standing, with right lateral lean requiring max assist to maintain upright prior and +2 max assist to control descent to bed    Balance Overall balance assessment: Needs assistance Sitting-balance support: Feet supported;Single extremity supported Sitting balance-Leahy Scale: Poor Sitting balance - Comments: initially max assist for sitting balance with pushers presentation, patient redirected to midline with oposing lateral lean, then patient able to maintain sitting balance with min assist for periods of time. Postural control: Right lateral lean Standing balance support: Bilateral upper extremity supported Standing balance-Leahy Scale: Poor Standing balance comment: required moderate to max assist for static standing                   ADL Overall ADL's : Needs assistance/impaired Eating/Feeding: NPO   Grooming: Wash/dry face;Moderate assistance;Sitting Grooming Details (indicate cue type and reason): Pt wiped mouth x 1, required mod A the other attempt.                      Toileting- Clothing Manipulation and Hygiene: Total assistance Toileting - Clothing Manipulation Details (indicate cue type and reason): Pt incontinent of stool. Assisted with peri care in sitting and in supine               Vision                 Additional Comments: Pt with Lt gaze preference.  Requires mod cues to locate items on Rt    Perception     Praxis  Cognition     Overall Cognitive Status: Difficult to assess Area of Impairment: Attention;Following commands   Current Attention Level: Focused;Sustained    Following Commands: Follows one step commands inconsistently     Problem Solving: Slow processing;Decreased initiation General Comments: pt with significant communication deficits and  tracheostomy - unable to accurately assess cognition     Extremity/Trunk Assessment               Exercises Other Exercises Other Exercises: dynamic trunk control activity in sitting at EOB to faciliatate midline Other Exercises: placing of UE for faciliatation of postural alignment   Shoulder Instructions       General Comments      Pertinent Vitals/ Pain       Pain Assessment: Faces Faces Pain Scale: Hurts even more Pain Location: generalized  Pain Descriptors / Indicators: Grimacing Pain Intervention(s): Monitored during session;Repositioned  Home Living                                          Prior Functioning/Environment              Frequency  Min 2X/week        Progress Toward Goals  OT Goals(current goals can now be found in the care plan section)  Progress towards OT goals: Progressing toward goals     Plan Discharge plan needs to be updated    Co-evaluation    PT/OT/SLP Co-Evaluation/Treatment: Yes Reason for Co-Treatment: Complexity of the patient's impairments (multi-system involvement);For patient/therapist safety   OT goals addressed during session: ADL's and self-care;Strengthening/ROM      End of Session Equipment Utilized During Treatment: Oxygen   Activity Tolerance Patient tolerated treatment well   Patient Left in bed;with call bell/phone within reach;with bed alarm set;with SCD's reapplied;with restraints reapplied   Nurse Communication Mobility status;Precautions        Time:  -    10/12/16 1700  OT Time Calculation  OT Start Time (ACUTE ONLY) 1555  OT Stop Time (ACUTE ONLY) 1639  OT Time Calculation (min) 44 min      Charges:     Lucille Passy, OTR/L [id number]   Lucille Passy M 10/13/2016, 10:47 AM

## 2016-10-13 NOTE — Procedures (Signed)
Successful fluoroscopic guided insertion of gastrostomy tube.   The gastrostomy tube may be used immediately for medications.   Tube feeds may be initiated in 24 hours as per the primary team.   EBL: Minimal No immediate post procedural complications.   Jay Lanyla Costello, MD Pager #: [phone]    

## 2016-10-13 NOTE — Sedation Documentation (Signed)
Patient is resting comfortably.pt not agitated anymore, calm and resting

## 2016-10-13 NOTE — Progress Notes (Signed)
Hypoglycemic Event  CBG: 66  Treatment: D50 IV 25 mL  Symptoms: None  Follow-up CBG: Time:2015 CBG Result:105  Possible Reasons for Event: Inadequate meal intake tube feeds stopped for g tube insertion  Comments/MD notified: Gonfa Changed D5 to 46ml/hr from 32ml/hr    Lilly

## 2016-10-14 DIAGNOSIS — E1149 Type 2 diabetes mellitus with other diabetic neurological complication: Secondary | ICD-10-CM

## 2016-10-14 LAB — GLUCOSE, CAPILLARY
GLUCOSE-CAPILLARY: 225 mg/dL — AB (ref 65–99)
Glucose-Capillary: 107 mg/dL — ABNORMAL HIGH (ref 65–99)
Glucose-Capillary: 143 mg/dL — ABNORMAL HIGH (ref 65–99)
Glucose-Capillary: 199 mg/dL — ABNORMAL HIGH (ref 65–99)
Glucose-Capillary: 198 mg/dL — ABNORMAL HIGH (ref 65–99)

## 2016-10-14 LAB — CBC
HEMATOCRIT: 26.7 % — AB (ref 36.0–46.0)
HEMOGLOBIN: 8.1 g/dL — AB (ref 12.0–15.0)
MCH: 29.8 pg (ref 26.0–34.0)
MCHC: 30.3 g/dL (ref 30.0–36.0)
MCV: 98.2 fL (ref 78.0–100.0)
Platelets: 264 10*3/uL (ref 150–400)
RBC: 2.72 MIL/uL — ABNORMAL LOW (ref 3.87–5.11)
RDW: 16.1 % — ABNORMAL HIGH (ref 11.5–15.5)
WBC: 12.6 10*3/uL — AB (ref 4.0–10.5)

## 2016-10-14 LAB — BASIC METABOLIC PANEL
ANION GAP: 10 (ref 5–15)
BUN: 42 mg/dL — AB (ref 6–20)
CHLORIDE: 100 mmol/L — AB (ref 101–111)
CO2: 29 mmol/L (ref 22–32)
Calcium: 9.2 mg/dL (ref 8.9–10.3)
Creatinine, Ser: 1.36 mg/dL — ABNORMAL HIGH (ref 0.44–1.00)
GFR calc Af Amer: 43 mL/min — ABNORMAL LOW (ref >60)
GFR, EST NON AFRICAN AMERICAN: 37 mL/min — AB (ref >60)
GLUCOSE: 120 mg/dL — AB (ref 65–99)
POTASSIUM: 3.4 mmol/L — AB (ref 3.5–5.1)
Sodium: 139 mmol/L (ref 135–145)

## 2016-10-14 LAB — MAGNESIUM: Magnesium: 2.1 mg/dL (ref 1.7–2.4)

## 2016-10-14 MED ORDER — POTASSIUM CHLORIDE 20 MEQ/15ML (10%) PO SOLN
20.0000 meq | Freq: Two times a day (BID) | ORAL | Status: DC
  Filled 2016-10-14: qty 15

## 2016-10-14 MED ORDER — DEXTROSE 5 % IV SOLN: INTRAVENOUS | Status: DC

## 2016-10-14 MED ORDER — SODIUM CHLORIDE 0.9 % IV SOLN
INTRAVENOUS | Status: DC
  Administered 2016-10-14 – 2016-10-17 (×5): via INTRAVENOUS

## 2016-10-14 MED ORDER — PRO-STAT SUGAR FREE PO LIQD
30.0000 mL | Freq: Every day | ORAL | Status: DC
  Administered 2016-10-14 – 2016-10-20 (×7): 30 mL
  Filled 2016-10-14 (×7): qty 30

## 2016-10-14 MED ORDER — GLUCERNA 1.2 CAL PO LIQD
1000.0000 mL | ORAL | Status: DC
  Administered 2016-10-14 – 2016-10-20 (×6): 1000 mL
  Filled 2016-10-14 (×10): qty 1000

## 2016-10-14 MED ORDER — POTASSIUM CHLORIDE 20 MEQ/15ML (10%) PO SOLN
40.0000 meq | Freq: Once | ORAL | Status: AC
  Administered 2016-10-14: 40 meq via ORAL
  Filled 2016-10-14: qty 30

## 2016-10-14 MED ORDER — POTASSIUM CHLORIDE 20 MEQ/15ML (10%) PO SOLN: 20.0000 meq | Freq: Two times a day (BID) | ORAL | Status: DC

## 2016-10-14 MED ORDER — HYDROCHLOROTHIAZIDE 25 MG PO TABS
25.0000 mg | ORAL_TABLET | Freq: Every day | ORAL | Status: DC
  Administered 2016-10-14: 25 mg
  Filled 2016-10-14 (×2): qty 1

## 2016-10-14 NOTE — Progress Notes (Signed)
Nutrition Follow-up  DOCUMENTATION CODES:   Not applicable  INTERVENTION:   Resume Glucerna 1.2 via PEG at to 55 ml/h (1320 ml) with Prostat 30 ml once daily to provide 1684 kcal, 94 gm protein, 1063 ml free water daily.  NUTRITION DIAGNOSIS:   Inadequate oral intake related to inability to eat as evidenced by NPO status.  Ongoing  GOAL:   Patient will meet greater than or equal to 90% of their needs  Progressing  MONITOR:   TF tolerance, Vent status, Labs  REASON FOR ASSESSMENT:   Consult Enteral/tube feeding initiation and management  ASSESSMENT:   75 y/o female w/ extensive PMHx including Anemia, Anxiety, CAD, COPD, CAD, HTN, DM2, MI, GERD, Hiatal Hernia, Cirrhosis. Noted by family to be making gurgling noises and not moving right side. CT showed acute L MCA infarct and fx of Rt superior and inferior pubic rami.   Patient on ventilator support via trach. Weaning trials continue.  MV: 8.9 L/min Temp (24hrs), Avg:99.7 F (37.6 C), Min:99.1 F (37.3 C), Max:100 F (37.8 C)  Pt underwent PEG placement by IR on 10/13/16. Per imaging report, tube placement confirmed in the gastric body.   RD received consult to re-start TF.   Per MD notes, plan to d/c to [facility].   Labs reviewed: K: 3.4, CBGS: 107-199.   Diet Order:  Diet NPO time specified  Skin:  Reviewed, no issues  Last BM:  11/9  Height:   Ht Readings from Last 1 Encounters:  09/30/16 5\' 7"  (1.702 m)    Weight:   Wt Readings from Last 1 Encounters:  10/14/16 184 lb 1.4 oz (83.5 kg)    Ideal Body Weight:  61.36 kg  BMI:  Body mass index is 28.83 kg/m.  Estimated Nutritional Needs:   Kcal:  [id number]  Protein:  90-100 gm  Fluid:  >/= 1.9 L  EDUCATION NEEDS:   No education needs identified at this time  Marcelline Temkin A. Jimmye Norman, RD, LDN, CDE Pager: [phone] After hours Pager: [phone]

## 2016-10-14 NOTE — Progress Notes (Signed)
Inpatient Rehabilitation  Continuing to follow from a distance.  Note that patient progressing with trach collar during the day.  Will plan to continue to follow for trach collar versus vent at night.  Plan for my co-worker Gerlean Ren to follow up Monday 10/17/16.  Carmelia Roller., CCC/SLP Admission Coordinator  [place]  Cell [phone]

## 2016-10-14 NOTE — Progress Notes (Signed)
Notified Heather Dietitian in regards to reevaluation of tube feeds being restarted. Waiting for orders

## 2016-10-14 NOTE — Progress Notes (Signed)
Occupational Therapy Treatment Patient Details Name: [name] MRN: [id number] DOB: [birth year] Today's Date: 10/14/2016    History of present illness Patient is a 75 y/o female with hx of DM, PVD, PAD, MI, HTN, gout, COPD, CAD, lower limb ischemia presents with right sided weakness. 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 Found to have Nondisplaced fracture of the right superior and inferior pubic rami. 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    OT comments  Pt demonstrates improving trunk control and balance, as well as improved activity tolerance.  She is able to perform simple grooming tasks with max A hand over hand assist using her Rt UE.  She sat EOB ~25 mins with min guard assist to occasional min A, and transferred to chair with max A +2.  Sats remained > 95% on 40% FIO2 via trach collar.   Continues with Rt inattention/neglect.   Follow Up Recommendations  LTACH    Equipment Recommendations  3 in 1 bedside comode;Wheelchair (measurements OT);Wheelchair cushion (measurements OT);Hospital bed    Recommendations for Other Services      Precautions / Restrictions Precautions Precautions: Fall Precaution Comments: wrist restraints; vent       Mobility Bed Mobility Overal bed mobility: Needs Assistance Bed Mobility: Supine to Sit     Supine to sit: Mod assist     General bed mobility comments: Pt moved Lt LE off bed independently, required assist for Rt LE and to lift trunk   Transfers Overall transfer level: Needs assistance Equipment used: 2 person hand held assist Transfers: Sit to/from [employer] Transfers Sit to Stand: Mod assist;+2 physical assistance Stand pivot transfers: Max assist;+2 physical assistance       General transfer comment: Pt requires assist to move into standing position,  with facilitation to extend hips and trunk as well as to prevent Rt knee buckling     Balance Overall balance assessment: Needs assistance Sitting-balance support: Feet supported;Single extremity supported Sitting balance-Leahy Scale: Poor Sitting balance - Comments: Pt sat EOB with min guard assist and occasional min facilitation to correct posture.  She at times, will lean posteriorly, but does not lose balance.  She maintains flexed posture.  Attempted facilitation of trunk into extension, resulting in pt grimacing and obvious discomfort so this was discontinued  Postural control: Posterior lean Standing balance support: Bilateral upper extremity supported Standing balance-Leahy Scale: Poor Standing balance comment: requires mod A +2 to maintain static standing during peri care.  Facilitation of hips and trunk into extension and blocking of Rt knee to prevent buckling                    ADL Overall ADL's : Needs assistance/impaired     Grooming: Wash/dry hands;Wash/dry face;Maximal assistance;Sitting Grooming Details (indicate cue type and reason): Pt required hand over hand assist to wash face with Rt UE and to apply lotion to hands                  Toilet Transfer: Maximal assistance;+2 for physical assistance;Stand-pivot;BSC   Toileting- Clothing Manipulation and Hygiene: Total assistance;Sit to/from stand Toileting - Clothing Manipulation Details (indicate cue type and reason): Pt incontinent of stool.  Assisted with peri care in standing     Functional mobility during ADLs: Maximal assistance;+2 for physical assistance        Vision  Additional Comments: Pt continues with Lt gaze preference.  At end of session, she did look to Rt  x 2 spontaneously    Perception     Praxis      Cognition   Behavior During Therapy: Flat affect Overall Cognitive Status: Difficult to assess          Following Commands: Follows one step commands  consistently (with cues )     Problem Solving: Slow processing;Decreased initiation General Comments: unable to accurately assess cognition due to communication deficits and trach    Extremity/Trunk Assessment               Exercises Other Exercises Other Exercises: Pt with moderate edema Rt hand.  Minimal retrograde massage performed with Rt UE elevated on pillows.  PROM/AAROM Rt UE WFL with min facilitation of scap into upward rotation with shoulder flexion    Shoulder Instructions       General Comments      Pertinent Vitals/ Pain       Pain Assessment: Faces Faces Pain Scale: No hurt  Home Living                                          Prior Functioning/Environment              Frequency  Min 2X/week        Progress Toward Goals  OT Goals(current goals can now be found in the care plan section)  Progress towards OT goals: Progressing toward goals  Acute Rehab OT Goals Time For Goal Achievement: 10/21/16 Potential to Achieve Goals: Good ADL Goals Pt Will Perform Grooming: with min assist;sitting Pt Will Perform Lower Body Bathing: with mod assist;sit to/from stand Additional ADL Goal #1: Pt will follow 1 step command 2 out 3 trials.  Additional ADL Goal #2: Pt will complete basic transfer total +2 mod (A) as precursor to adls.   Plan Discharge plan remains appropriate    Co-evaluation    PT/OT/SLP Co-Evaluation/Treatment: Yes Reason for Co-Treatment: Complexity of the patient's impairments (multi-system involvement);For patient/therapist safety PT goals addressed during session: Mobility/safety with mobility OT goals addressed during session: ADL's and self-care;Strengthening/ROM      End of Session Equipment Utilized During Treatment: Oxygen;Gait belt   Activity Tolerance Patient tolerated treatment well   Patient Left in chair;with call bell/phone within reach;with chair alarm set   Nurse Communication Mobility  status;Need for lift equipment        Time: 1025-1101 OT Time Calculation (min): 36 min  Charges: OT General Charges $OT Visit: 1 Procedure OT Treatments $Neuromuscular Re-education: 8-22 mins  Favor Hackler M 10/14/2016, 12:13 PM

## 2016-10-14 NOTE — Progress Notes (Signed)
Placed patient back on full support due to increased WOB, increased HR.

## 2016-10-14 NOTE — Care Management Note (Signed)
Case Management Note Original note created by Ellan Lambert 10/06/16  Patient Details  Name: [name] MRN: [id number] Date of Birth: December 23, 1940  Subjective/Objective:  Pt admitted on 09/30/16 s/p Lt MCA infarct.  PTA, pt independent, lives with family members.                    Action/Plan: Pt remains intubated currently.  Planning possible tracheostomy on Friday, 11/10.  Will follow progress.    Expected Discharge Date:                  Expected Discharge Plan:  Long Term Acute Care (LTAC)  In-House Referral:     Discharge planning Services  CM Consult  Post Acute Care Choice:    Choice offered to:     DME Arranged:    DME Agency:     HH Arranged:    HH Agency:     Status of Service:  In process, will continue to follow  If discussed at Long Length of Stay Meetings, dates discussed:    Additional Comments: 10/14/2016  LTACH denied by insurance.  CM informed attending Rama and provided peer to peer number [phone] - has to be done prior to 11/20 at 3pm.  10/12/16 Attending discussed and answered questions of sister and [facility] has been officially selected.  [facility] has began [employer].  Discussed in LOS 10/13/16; remains appropriate for continued stay  CM again requested attending to consult with pt's sister so that determination of LTACH could be made.  Attempt at contact was made by attending however sister was unreachable via phone - will continue to try.  Pt is scheduled for PEG tube placement tomorrow  10/11/16 [facility] liason met with sister at bedside.  Sister requested attending conference prior to choosing which facility - attending paged  Discussed in LOS 11/14:  Pt remains appropriate for continued stay and deemed appropriate for [facility] referral.  CM provided referral to both [facility] and [facility].  CM spoke with both brother [name] and sister [name] and choice for LTACH was given.  Sister immediately chose [facility] due to location - sister is in  agreement to meet with [facility] today at 1pm.  CM will inform [facility] of selection.  Pt stays with sister and per sister; pt was completely independent prior to admit   10/10/16 Pt transferred to [facility] over weekend.  Pt trached 10/07/16 - remains on ventilator 30%, sedation discontinued earlier this am, tube feeds on hold awaiting access - may need PEG at some point.  Creatinine rising.  Attending requested LTACH referral - CM called referral in to physician advisor to determine appropriateness.  PT recommending CIR; however pt is not currently participating in therapies

## 2016-10-14 NOTE — Progress Notes (Signed)
Placed patient on 40% ATC for  BID wean per MD order. RT will continue to monitor .

## 2016-10-14 NOTE — Progress Notes (Signed)
Referring Physician(s): Dr Loletha Grayer Rama  Supervising Physician: Arne Cleveland  Patient Status:  [facility] - In-pt  Chief Complaint:  Percutaneous gastric tube placed in IR 11/16  Subjective:  Pt resting Comfortable afeb trach  Allergies: Amlodipine besy-benazepril hcl; Statins; Tradjenta [linagliptin]; Plavix [clopidogrel]; and Raptiva [efalizumab]  Medications: Prior to Admission medications   Medication Sig Start Date End Date Taking? Authorizing Provider  calcipotriene (DOVONOX) 0.005 % cream Apply 1 application topically 2 (two) times daily. 09/20/16  Yes Historical Provider, MD  carvedilol (COREG) 3.125 MG tablet Take 1 tablet by mouth 2  times daily with  meals Patient taking differently: Take 1 tablet (3.125mg ) by mouth 2  times daily with  meals 07/15/16  Yes Lorretta Harp, MD  dorzolamide (TRUSOPT) 2 % ophthalmic solution Place 1 drop into the right eye 2 (two) times daily.   Yes Historical Provider, MD  esomeprazole (NEXIUM) 40 MG capsule Take 40 mg by mouth daily before breakfast.   Yes Historical Provider, MD  furosemide (LASIX) 40 MG tablet Take 1 tablet (40 mg total) by mouth daily. 07/07/14  Yes Lorretta Harp, MD  glimepiride (AMARYL) 2 MG tablet Take 2 mg by mouth daily with breakfast.  10/21/14  Yes Historical Provider, MD  metFORMIN (GLUCOPHAGE) 500 MG tablet Take 500 mg by mouth daily after supper. Pt. Told that she is  only to take only as needed , for a blood sugar of 250 or greater   Yes Historical Provider, MD  nystatin (MYCOSTATIN) 100000 UNIT/ML suspension Use as directed 5 mLs in the mouth or throat every 8 (eight) hours. 09/19/16  Yes Historical Provider, MD  pravastatin (PRAVACHOL) 10 MG tablet Take 10 mg by mouth daily. Monday  and Thursday    Yes Historical Provider, MD  predniSONE (DELTASONE) 5 MG tablet Take 5 mg by mouth daily.    Yes Historical Provider, MD  ranitidine (ZANTAC) 150 MG tablet Take 150 mg by mouth daily. 09/20/16  Yes Historical  Provider, MD  Travoprost, BAK Free, (TRAVATAMN) 0.004 % SOLN ophthalmic solution Place 1 drop into both eyes at bedtime.    Yes Historical Provider, MD  ADVAIR [facility] 115-21 MCG/ACT inhaler Inhale 2 puffs into the lungs daily. 09/01/15   Historical Provider, MD  albuterol (PROVENTIL HFA;VENTOLIN HFA) 108 (90 BASE) MCG/ACT inhaler Inhale 1 puff into the lungs every 6 (six) hours as needed for shortness of breath.     Historical Provider, MD  ALPRAZolam Duanne Moron) 0.5 MG tablet Take 0.25 mg by mouth 2 (two) times daily as needed for anxiety or sleep.    Historical Provider, MD  ammonium lactate (LAC-HYDRIN) 12 % lotion Apply 1 application topically daily as needed for dry skin (psorias).     Historical Provider, MD  Artificial Tear Ointment (LUBRICANT EYE OP) Place 1 drop into both eyes as needed (for dry eyes).    Historical Provider, MD  aspirin 81 MG tablet Take 81 mg by mouth daily.    Historical Provider, MD  Cholecalciferol (VITAMIN D) 2000 UNITS CAPS Take 2,000 Units by mouth every morning.     Historical Provider, MD  feeding supplement, GLUCERNA SHAKE, (GLUCERNA SHAKE) LIQD Take 237 mLs by mouth daily at 3 pm.     Historical Provider, MD  ferrous sulfate 325 (65 FE) MG tablet Take 1 tablet (325 mg total) by mouth 2 (two) times daily with a meal. 03/21/14   Bobby Rumpf [place], PA-C  fluticasone (FLONASE) 50 MCG/ACT nasal spray Place 1 spray into  both nostrils at bedtime as needed for allergies.     Historical Provider, MD  ipratropium-albuterol (DUONEB) 0.5-2.5 (3) MG/3ML SOLN Take 3 mLs by nebulization every 4 (four) hours as needed (shortness of breath).    Historical Provider, MD  Liniments ([employer] ARTHRITIS PAIN RELIEVER EX) Apply 1 application topically 2 (two) times daily as needed (for shoulder pains, knee, back.).     Historical Provider, MD  Multiple Vitamins-Minerals (ICAPS) CAPS Take 1 capsule by mouth every morning.     Historical Provider, MD  ONE TOUCH ULTRA TEST test strip 1 each by Other route  See admin instructions. Check blood sugar twice daily 06/03/15   Historical Provider, MD  simethicone (MYLICON) 80 MG chewable tablet Chew 1 tablet (80 mg total) by mouth 4 (four) times daily as needed for flatulence. 12/04/13   Robbie Lis, MD     Vital Signs: BP 138/76   Pulse 67   Temp 99.8 F (37.7 C) (Oral)   Resp (!) 22   Ht 5\' 7"  (1.702 m)   Wt 184 lb 1.4 oz (83.5 kg)   SpO2 100%   BMI 28.83 kg/m   Physical Exam  Abdominal: Soft. Bowel sounds are normal. There is no tenderness.  Skin: Skin is warm and dry.  Site is clean and dry at G tube No bleeding NT  May use now  Nursing note and vitals reviewed.   Imaging: Ir Gastrostomy Tube Mod Sed  Result Date: 10/13/2016 INDICATION: History of left MCA infarct with hemorrhagic transformation, now with altered mental status. As such, request made for placement of a gastrostomy tube for long-term enteric nutrition supplementation. EXAM: PULL TROUGH GASTROSTOMY TUBE PLACEMENT COMPARISON:  None. MEDICATIONS: Ancef 2 gm IV; Antibiotics were administered within 1 hour of the procedure. Glucagon 1 mg IV CONTRAST:  20 cc Isovue 300 administered into the gastric lumen. ANESTHESIA/SEDATION: Moderate (conscious) sedation was employed during this procedure. A total of Versed 2 mg and Fentanyl 100 mcg was administered intravenously. Moderate Sedation Time: 40 minutes. The patient's level of consciousness and vital signs were monitored continuously by radiology nursing throughout the procedure under my direct supervision. FLUOROSCOPY TIME:  4 minutes 12 seconds (99991111 mGy) COMPLICATIONS: None immediate. PROCEDURE: Informed written consent was obtained from the patient's sister following explanation of the procedure, risks, benefits and alternatives. A time out was performed prior to the initiation of the procedure. Ultrasound scanning was performed to demarcate the edge of the left lobe of the liver. Maximal barrier sterile technique utilized including  caps, mask, sterile gowns, sterile gloves, large sterile drape, hand hygiene and Betadine prep. The left upper quadrant was sterilely prepped and draped. An oral gastric catheter was inserted into the stomach under fluoroscopy. The existing nasogastric feeding tube was removed. The left costal margin and air opacified transverse colon were identified and avoided. Air was injected into the stomach for insufflation and visualization under fluoroscopy. Under sterile conditions a 17 gauge trocar needle was utilized to access the stomach percutaneously beneath the left subcostal margin after the overlying soft tissues were anesthetized with 1% Lidocaine with epinephrine. Needle position was confirmed within the stomach with aspiration of air and injection of small amount of contrast. A single T tack was deployed for gastropexy. Over an Amplatz guide wire, a 9-French sheath was inserted into the stomach. A snare device was utilized to capture the oral gastric catheter. The snare device was pulled retrograde from the stomach up the esophagus and out the oropharynx. The 20-French pull-through  gastrostomy was connected to the snare device and pulled antegrade through the oropharynx down the esophagus into the stomach and then through the percutaneous tract external to the patient. The gastrostomy was assembled externally. Contrast injection confirms position in the stomach. Several spot radiographic images were obtained in various obliquities for documentation. The patient tolerated procedure well without immediate post procedural complication. FINDINGS: After successful fluoroscopic guided placement, the gastrostomy tube is appropriately positioned with internal disc against the ventral aspect of the gastric lumen. IMPRESSION: Successful fluoroscopic insertion of a 20-French pull-through gastrostomy tube. The gastrostomy may be used immediately for medication administration and in 24 hrs for the initiation of feeds.  Electronically Signed   By: Sandi Mariscal M.D.   On: 10/13/2016 15:02   Dg Chest Port 1 View  Result Date: 10/11/2016 CLINICAL DATA:  75 year old female with shortness of breath. Respiratory failure. Tracheostomy. Initial encounter. EXAM: PORTABLE CHEST 1 VIEW COMPARISON:  10/10/2016 and earlier. FINDINGS: Portable AP semi upright view at 1146 hours. Tracheostomy tube appears stable. A feeding tube courses to the abdomen, tip not included. Lung volumes are stable. Mild elevation of the left hemidiaphragm is stable. Stable cardiac size and mediastinal contours. Calcified aortic atherosclerosis. No pneumothorax, pulmonary edema or consolidation. Mild veiling opacity at the right lung base such that a small right pleural effusion is difficult to exclude. No acute osseous abnormality identified. IMPRESSION: 1.  Stable lines and tubes. 2. Stable ventilation with no focal pulmonary abnormality aside from a possible small right pleural effusion. Electronically Signed   By: Genevie Ann M.D.   On: 10/11/2016 11:57   Dg Abd Portable 1v  Result Date: 10/10/2016 CLINICAL DATA:  Feeding tube placement EXAM: PORTABLE ABDOMEN - 1 VIEW COMPARISON:  Abdominal radiograph 10/08/2016 FINDINGS: The enteric tube courses beyond the field of view in the tip is not visualized adequately. There is bibasilar atelectasis. IMPRESSION: Inadequate visualization of enteric tube tip, which is beyond the field of view. Electronically Signed   By: Ulyses Jarred M.D.   On: 10/10/2016 16:09    Labs:  CBC:  Recent Labs  10/10/16 0320 10/11/16 0340 10/12/16 0317 10/14/16 0343  WBC 10.0 15.0* 9.1 12.6*  HGB 11.9* 9.0* 7.5* 8.1*  HCT 38.5 29.7* 25.2* 26.7*  PLT 144* 187 182 264    COAGS:  Recent Labs  09/30/16 0940 10/06/16 0900 10/13/16 0345  INR 1.04 1.11 0.94  APTT 24 33  --     BMP:  Recent Labs  10/12/16 0640 10/12/16 1821 10/13/16 0345 10/14/16 0343  NA 147* 134* 144 139  K 3.7 3.4* 3.5 3.4*  CL 111 101 107  100*  CO2 27 25 31 29   GLUCOSE 202* 547* 146* 120*  BUN 48* 45* 45* 42*  CALCIUM 9.3 8.3* 9.4 9.2  CREATININE 1.33* 1.22* 1.25* 1.36*  GFRNONAA 38* 42* 41* 37*  GFRAA 44* 49* 47* 43*    LIVER FUNCTION TESTS:  Recent Labs  09/30/16 0940 10/02/16 0403 10/03/16 0351 10/11/16 0340  BILITOT 0.7 1.0 0.9 0.7  AST 30 34 45* 41  ALT 21 20 30  55*  ALKPHOS 66 46 53 112  PROT 6.5 5.7* 6.1* 6.1*  ALBUMIN 3.3* 2.7* 3.0* 2.3*    Assessment and Plan:  May use G tube now  Electronically Signed: Daymien Goth A 10/14/2016, 10:27 AM   I spent a total of 15 Minutes at the the patient's bedside AND on the patient's hospital floor or unit, greater than 50% of which was counseling/coordinating care for  G tube placement

## 2016-10-14 NOTE — Progress Notes (Signed)
Physical Therapy Treatment Patient Details Name: [name] MRN: [id number] DOB: 08-20-1941 Today's Date: 10/14/2016    History of Present Illness Patient is a 75 y/o female with hx of DM, PVD, PAD, MI, HTN, gout, COPD, CAD, lower limb ischemia presents with right sided weakness. 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 Found to have Nondisplaced fracture of the right superior and inferior pubic rami. 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     PT Comments    Pt much more engaged, responsive, participative with improved balance and function today. Pt on trach collar 40% throughout with sats maintaining 97-100%. Pt able to tolerate EOB grossly 20 min as well as stand pivot to chair but recommend lift back with nursing to bed. Pt noticeably fatigued end of session. Will continue to follow to maximize function. Pt tracking to midline and occasionally crossing midline with eyes today. Remains inattentive to right side.   Follow Up Recommendations  Supervision/Assistance - 24 hour;LTACH     Equipment Recommendations       Recommendations for Other Services       Precautions / Restrictions Precautions Precautions: Fall Precaution Comments: wrist restraints; trach, peg Restrictions Weight Bearing Restrictions: No    Mobility  Bed Mobility Overal bed mobility: Needs Assistance Bed Mobility: Supine to Sit     Supine to sit: Mod assist     General bed mobility comments: Pt moved Lt LE off bed independently, required assist for Rt LE and to lift trunk, as well as pivot to EOB  Transfers Overall transfer level: Needs assistance Equipment used: 2 person hand held assist Transfers: Sit to/from [facility] Sit to Stand: Mod assist;+2 physical assistance Stand pivot transfers: Max assist;+2 physical  assistance       General transfer comment: sit to stand x 2 trials. Pt requires assist to move into standing position, with facilitation to extend hips and trunk as well as to prevent Rt knee buckling With standing assist of pad and belt to control and pivot hips to chair  Ambulation/Gait                 Stairs            Wheelchair Mobility    Modified Rankin (Stroke Patients Only) Modified Rankin (Stroke Patients Only) Pre-Morbid Rankin Score: No symptoms Modified Rankin: Severe disability     Balance Overall balance assessment: Needs assistance Sitting-balance support: Feet supported;Single extremity supported Sitting balance-Leahy Scale: Poor Sitting balance - Comments: Pt sat EOB with min guard assist and occasional min facilitation to correct posture.  She at times, will lean posteriorly, but does not lose balance.  She maintains flexed posture.  Postural control: Posterior lean;Right lateral lean Standing balance support: Bilateral upper extremity supported Standing balance-Leahy Scale: Poor Standing balance comment: requires mod A +2 to maintain static standing during peri care.  Facilitation of hips and trunk into extension and blocking of Rt knee to prevent buckling                     Cognition Arousal/Alertness: Awake/alert Behavior During Therapy: Flat affect Overall Cognitive Status: Difficult to assess         Following Commands: Follows one step commands consistently     Problem Solving: Slow processing;Decreased initiation General Comments: unable to accurately assess cognition due to communication deficits and trach  Exercises     General Comments        Pertinent Vitals/Pain Pain Assessment: Faces Faces Pain Scale: No hurt    Home Living                      Prior Function            PT Goals (current goals can now be found in the care plan section) Progress towards PT goals: Progressing toward goals     Frequency           PT Plan Current plan remains appropriate    Co-evaluation PT/OT/SLP Co-Evaluation/Treatment: Yes Reason for Co-Treatment: Complexity of the patient's impairments (multi-system involvement);For patient/therapist safety PT goals addressed during session: Balance;Mobility/safety with mobility OT goals addressed during session: ADL's and self-care;Strengthening/ROM     End of Session Equipment Utilized During Treatment: Gait belt Activity Tolerance: Patient tolerated treatment well Patient left: in chair;with call bell/phone within reach;with chair alarm set     Time: 1026-1101 PT Time Calculation (min) (ACUTE ONLY): 35 min  Charges:  $Therapeutic Activity: 8-22 mins                    G Codes:      Marlo Goodrich B Raveena Hebdon 11-01-16, 1:36 PM Elwyn Reach, [place]

## 2016-10-14 NOTE — Progress Notes (Signed)
Progress Note    [name]  EBR:[id number] DOB: Jun 21, 1941  DOA: 09/30/2016 PCP: Leola Brazil, MD    Brief Narrative:   Chief complaint: Follow-up left MCA infarct with hemorrhagic transformation  [name] is an 75 y.o. female with a PMH of CAD status post multiple stents to the LAD, prior history of MI and pericardial effusion, COPD, chronic bronchitis, cirrhosis of the liver without mention of alcohol and uncontrolled type 2 diabetes who was admitted 09/30/16 after being found unresponsive by her family. Upon admission, she had marked elevation of her blood pressure 224/113. She also was found to have nondisplaced fracture of the right superior and inferior pubic rami. 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  Assessment/Plan:   Principal Problem:   Arterial ischemic stroke, MCA (middle cerebral artery), left, acute ([place]), with hemorrhagic transformation Evaluated by neurologist on admission. Noted to have right upper extremity flaccid paralysis on initial presentation. She did not receive IV t-PA due to late presentation. CT confirmed a large left MCA evolving infarct with hemorrhagic transformation. MRI of the brain done 10/04/16 and showed acute infarct with stable hemorrhagic conversion. No midline shift. CTA of the head and neck showed neurologist recommended treatment with 3% normal saline to decrease intracranial pressure. No anti-thrombotic medications recommended secondary hemorrhagic transformation. She was intubated for airway protection. CT angiogram of the neck and head showed hypoattenuation throughout the left MCA, 40% stenosis of the proximal left internal carotid artery, moderate bilateral vertebral artery disease. No cardiac source of emboli identified on 2-D echo. Neurology does recommend a 30 day cardiac event monitor as an outpatient to rule out atrial  fibrillation. Seen by PT/OT on 10/05/16 with recommendations for CIR placement. Will need ongoing risk factor modification.  Active Problems:   Hypertensive emergency in the setting of a history of benign essential hypertension Permissive hypertension recommended by neurology on admission. Home dose of Coreg resumed 10/03/16. Hydralazine added 10/04/16. Antihypertensives titrated to achieve better blood pressure control. Nitrates and HCTZ added 10/12/16.    Pubic rami fractures/Closed fracture of multiple pubic rami, right, sequela Evaluated by orthopedic surgery (Dr. Lyla Glassing) on 09/30/16. Weightbearing as tolerated with a walker with outpatient orthopedic follow-up in 4 weeks recommended.    Pneumonia of left lower lobe due to infectious organism [facility]) The patient spiked a fever of 101.1 on 09/30/16 and her lactic acid was elevated and she was started on empiric vancomycin and Zosyn. Chest x-ray done 09/30/16 showed left airspace consolidation consistent with pneumonia, but follow-up chest x-ray on 10/01/16 showed no consolidation and antibiotics were subsequently discontinued.    Acute respiratory failure with hypoxemia (HCC)/ventilator dependence/tracheostomy status/tachypnea/chronic obstructive pulmonary disease Intubated on admission. Extubated 10/02/16, but could not tolerate this and hadn't be reintubated later that day. Tracheostomy had to be placed 10/07/16 due to inability to wean. Maintained on bronchodilators and Pulmicort.    Acute on Chronic combined systolic and diastolic heart failure ([place]) 2-D echo done 10/03/16: EF 40-45 percent with evidence of diastolic dysfunction. Currently on Lasix 40 mg twice a day.    Diabetes ([place]) goal hemoglobin A1c less than 7/uncontrolled type 2 diabetes with complications Hemoglobin A1c 8.1 percent. Currently being managed with Lantus 22 units daily, NovoLog 4 units every 4 hours, and SSI every 4 hours. CBGs 10 5-199.    Dysphagia secondary to  stroke Evaluated by speech therapist. Persistently dysphasic and ultimately had PEG tube  placed 10/13/16. Tube feedings to be initiated today.    Coronary artery disease involving native coronary artery of native heart without angina pectoris    Mixed hyperlipidemia with goal LDL less than 70 LDL 131. Noted to have a history of statin intolerance, but on Pravachol 10 mg twice a week at home.    Hypernatremia Secondary to hypertonic saline administration.    Hypokalemia Electrolytes replaced as needed. We'll place on routine potassium supplementation given treatment with Lasix.    Hypoalbuminemia due to protein-calorie malnutrition ([place])    Acute on chronic renal insufficiency Creatinine elevated at 1.45 on admission. Creatinine 1 year ago was around 1.09. Creatinine trending down over time, but still not back to baseline.    Anemia of chronic disease Noted to have a drop in hemoglobin from 8.9---> 7.2 mg/dL on 10/04/16. No obvious source of bleeding found.    Pressure injury of skin Pressure reduction mattress ordered. Nursing care to prevent further pressure injury.    Obesity with BMI 29.59 Dietitian following for tube feeding recommendations.    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   Family Communication/Anticipated D/C date and plan/Code Status   DVT prophylaxis: Subcutaneous heparin ordered. Code Status: Full Code.  Family Communication: Sister updated by telephone. Disposition Plan: Lived with her sister. Will need LTAC or CIR at discharge.   Medical Consultants:    PCCM  Neurology  Physical Medicine  IR   Procedures:    PICC line placed 10/02/16.  2-D echo done 10/03/16: EF 40-45 percent akinesis of the mid-apicalanteroseptal, anterior, anterolateral, lateral, inferolateral, inferior, inferoseptal, and apical myocardium. Doppler parameters are consistent  with abnormal left ventricular relaxation (grade 1 diastolic dysfunction).  Lower extremity Dopplers done 10/04/16: No evidence of DVT.  Tracheostomy placed 10/07/16.  PEG tube placed 10/13/16.  Anti-Infectives:    Vancomycin/Zosyn 10/01/1723 hours.  Subjective:   Patient is nonverbal and not able to follow commands although she nods when asked questions.  Objective:    Vitals:   10/14/16 1100 10/14/16 1135 10/14/16 1200 10/14/16 1240  BP: 114/64 114/64 128/76   Pulse: 66 74 74   Resp: (!) 33 (!) 31 17   Temp:    99.9 F (37.7 C)  TempSrc:    Oral  SpO2: 100% 99% 100%   Weight:      Height:        Intake/Output Summary (Last 24 hours) at 10/14/16 1350 Last data filed at 10/14/16 1200  Gross per 24 hour  Intake           971.67 ml  Output             2015 ml  Net         -1043.33 ml   Filed Weights   10/12/16 0303 10/13/16 0400 10/14/16 0500  Weight: 80.6 kg (177 lb 11.1 oz) 83 kg (182 lb 15.7 oz) 83.5 kg (184 lb 1.4 oz)    Exam: General exam: She has hand mittens in place and appears mildly restless. Respiratory system: Clear to auscultation. Respiratory effort mildly increased with a tracheostomy in place. Cardiovascular system: Heart sounds are irregular. No JVD,  rubs, gallops or clicks. No murmurs. Gastrointestinal system: Abdomen is nondistended, soft and nontender. No organomegaly or masses felt. Normal bowel sounds heard. Central nervous system: Alert, aphasic, right-sided facial droop. Unable to follow commands. Extremities: No clubbing,  or cyanosis. No edema. Skin: No rashes, lesions or ulcers. Psychiatry: Judgement  and insight appear impaired. Mood & affect depressed/anxious.   Data Reviewed:   I have personally reviewed following labs and imaging studies:  Labs: Basic Metabolic Panel:  Recent Labs Lab 10/11/16 1805 10/12/16 0640 10/12/16 1821 10/13/16 0345 10/14/16 0343  NA 148* 147* 134* 144 139  K 3.8 3.7 3.4* 3.5 3.4*  CL 113* 111  101 107 100*  CO2 28 27 25 31 29   GLUCOSE 236* 202* 547* 146* 120*  BUN 51* 48* 45* 45* 42*  CREATININE 1.33* 1.33* 1.22* 1.25* 1.36*  CALCIUM 9.3 9.3 8.3* 9.4 9.2  MG  --   --   --  2.2 2.1  PHOS  --   --   --  3.8  --    GFR Estimated Creatinine Clearance: 39.1 mL/min (by C-G formula based on SCr of 1.36 mg/dL (H)). Liver Function Tests:  Recent Labs Lab 10/11/16 0340  AST 41  ALT 55*  ALKPHOS 112  BILITOT 0.7  PROT 6.1*  ALBUMIN 2.3*   Coagulation profile  Recent Labs Lab 10/13/16 0345  INR 0.94    CBC:  Recent Labs Lab 10/09/16 0430 10/10/16 0320 10/11/16 0340 10/12/16 0317 10/14/16 0343  WBC 8.1 10.0 15.0* 9.1 12.6*  NEUTROABS 5.8 7.8*  --   --   --   HGB 7.7* 11.9* 9.0* 7.5* 8.1*  HCT 25.1* 38.5 29.7* 25.2* 26.7*  MCV 99.6 98.7 100.3* 101.2* 98.2  PLT 164 144* 187 182 264   Cardiac Enzymes:  Recent Labs Lab 10/13/16 0905  TROPONINI 0.04*   CBG:  Recent Labs Lab 10/13/16 2014 10/13/16 2346 10/14/16 0313 10/14/16 0716 10/14/16 1240  GLUCAP 105* 122* 107* 143* 199*   Sepsis Labs:  Recent Labs Lab 10/10/16 0320 10/11/16 0340 10/12/16 0317 10/14/16 0343  WBC 10.0 15.0* 9.1 12.6*    Microbiology No results found for this or any previous visit (from the past 240 hour(s)).  Radiology: Ir Lewie Loron Mod Sed  Result Date: 10/13/2016 INDICATION: History of left MCA infarct with hemorrhagic transformation, now with altered mental status. As such, request made for placement of a gastrostomy tube for long-term enteric nutrition supplementation. EXAM: PULL TROUGH GASTROSTOMY TUBE PLACEMENT COMPARISON:  None. MEDICATIONS: Ancef 2 gm IV; Antibiotics were administered within 1 hour of the procedure. Glucagon 1 mg IV CONTRAST:  20 cc Isovue 300 administered into the gastric lumen. ANESTHESIA/SEDATION: Moderate (conscious) sedation was employed during this procedure. A total of Versed 2 mg and Fentanyl 100 mcg was administered intravenously.  Moderate Sedation Time: 40 minutes. The patient's level of consciousness and vital signs were monitored continuously by radiology nursing throughout the procedure under my direct supervision. FLUOROSCOPY TIME:  4 minutes 12 seconds (194 mGy) COMPLICATIONS: None immediate. PROCEDURE: Informed written consent was obtained from the patient's sister following explanation of the procedure, risks, benefits and alternatives. A time out was performed prior to the initiation of the procedure. Ultrasound scanning was performed to demarcate the edge of the left lobe of the liver. Maximal barrier sterile technique utilized including caps, mask, sterile gowns, sterile gloves, large sterile drape, hand hygiene and Betadine prep. The left upper quadrant was sterilely prepped and draped. An oral gastric catheter was inserted into the stomach under fluoroscopy. The existing nasogastric feeding tube was removed. The left costal margin and air opacified transverse colon were identified and avoided. Air was injected into the stomach for insufflation and visualization under fluoroscopy. Under sterile conditions a 17 gauge trocar needle was utilized to access the stomach percutaneously  beneath the left subcostal margin after the overlying soft tissues were anesthetized with 1% Lidocaine with epinephrine. Needle position was confirmed within the stomach with aspiration of air and injection of small amount of contrast. A single T tack was deployed for gastropexy. Over an Amplatz guide wire, a 9-French sheath was inserted into the stomach. A snare device was utilized to capture the oral gastric catheter. The snare device was pulled retrograde from the stomach up the esophagus and out the oropharynx. The 20-French pull-through gastrostomy was connected to the snare device and pulled antegrade through the oropharynx down the esophagus into the stomach and then through the percutaneous tract external to the patient. The gastrostomy was  assembled externally. Contrast injection confirms position in the stomach. Several spot radiographic images were obtained in various obliquities for documentation. The patient tolerated procedure well without immediate post procedural complication. FINDINGS: After successful fluoroscopic guided placement, the gastrostomy tube is appropriately positioned with internal disc against the ventral aspect of the gastric lumen. IMPRESSION: Successful fluoroscopic insertion of a 20-French pull-through gastrostomy tube. The gastrostomy may be used immediately for medication administration and in 24 hrs for the initiation of feeds. Electronically Signed   By: Sandi Mariscal M.D.   On: 10/13/2016 15:02    Medications:   . budesonide (PULMICORT) nebulizer solution  0.5 mg Nebulization BID  . carvedilol  12.5 mg Oral BID WC  . chlorhexidine gluconate (MEDLINE KIT)  15 mL Mouth Rinse BID  . docusate  100 mg Oral BID  . famotidine  20 mg Per Tube Daily  . feeding supplement (PRO-STAT SUGAR FREE 64)  30 mL Per Tube Daily  . furosemide  40 mg Intravenous BID  . heparin subcutaneous  5,000 Units Subcutaneous Q8H  . hydrALAZINE  100 mg Per Tube Q8H  . hydrochlorothiazide  25 mg Per Tube Daily  . insulin aspart  0-20 Units Subcutaneous Q4H  . insulin aspart  4 Units Subcutaneous Q4H  . insulin glargine  22 Units Subcutaneous Daily  . mouth rinse  15 mL Mouth Rinse QID  . potassium chloride  40 mEq Oral Once  . sodium chloride flush  10-40 mL Intracatheter Q12H   Continuous Infusions: . dextrose 50 mL/hr at 10/14/16 1115  . feeding supplement (GLUCERNA 1.2 CAL) Stopped (10/13/16 0000)    Medical decision making is of high complexity and this patient is at high risk of deterioration, therefore this is a level 3 visit.   LOS: 14 days   [facility] Pager [phone]. If unable to reach me by pager, please call my cell phone at [phone].  *Please refer to [email or web address], password [email or web address] to get  updated schedule on who will round on this patient, as hospitalists switch teams weekly. If 7PM-7AM, please contact night-coverage at [email or web address], password [email or web address] for any overnight needs.  10/14/2016, 1:50 PM

## 2016-10-15 LAB — BASIC METABOLIC PANEL
Anion gap: 8 (ref 5–15)
BUN: 18 mg/dL (ref 6–20)
CO2: 26 mmol/L (ref 22–32)
CREATININE: 2.24 mg/dL — AB (ref 0.44–1.00)
Calcium: 9.2 mg/dL (ref 8.9–10.3)
Chloride: 103 mmol/L (ref 101–111)
GFR calc Af Amer: 23 mL/min — ABNORMAL LOW (ref >60)
GFR, EST NON AFRICAN AMERICAN: 20 mL/min — AB (ref >60)
Glucose, Bld: 156 mg/dL — ABNORMAL HIGH (ref 65–99)
Potassium: 4.7 mmol/L (ref 3.5–5.1)
SODIUM: 137 mmol/L (ref 135–145)

## 2016-10-15 LAB — GLUCOSE, CAPILLARY
GLUCOSE-CAPILLARY: 194 mg/dL — AB (ref 65–99)
GLUCOSE-CAPILLARY: 261 mg/dL — AB (ref 65–99)
Glucose-Capillary: 153 mg/dL — ABNORMAL HIGH (ref 65–99)
Glucose-Capillary: 174 mg/dL — ABNORMAL HIGH (ref 65–99)
Glucose-Capillary: 194 mg/dL — ABNORMAL HIGH (ref 65–99)
Glucose-Capillary: 216 mg/dL — ABNORMAL HIGH (ref 65–99)

## 2016-10-15 LAB — CBC
HEMATOCRIT: 22.6 % — AB (ref 36.0–46.0)
Hemoglobin: 7.1 g/dL — ABNORMAL LOW (ref 12.0–15.0)
MCH: 27.3 pg (ref 26.0–34.0)
MCHC: 31.4 g/dL (ref 30.0–36.0)
MCV: 86.9 fL (ref 78.0–100.0)
Platelets: 153 10*3/uL (ref 150–400)
RBC: 2.6 MIL/uL — ABNORMAL LOW (ref 3.87–5.11)
RDW: 16.1 % — AB (ref 11.5–15.5)
WBC: 8.2 10*3/uL (ref 4.0–10.5)

## 2016-10-15 LAB — MAGNESIUM: MAGNESIUM: 2.6 mg/dL — AB (ref 1.7–2.4)

## 2016-10-15 MED ORDER — POTASSIUM CHLORIDE 20 MEQ/15ML (10%) PO SOLN: 20.0000 meq | Freq: Every day | ORAL | Status: DC

## 2016-10-15 MED ORDER — INSULIN GLARGINE 100 UNIT/ML ~~LOC~~ SOLN
25.0000 [IU] | Freq: Every day | SUBCUTANEOUS | Status: DC
  Administered 2016-10-15 – 2016-10-20 (×6): 25 [IU] via SUBCUTANEOUS
  Filled 2016-10-15 (×7): qty 0.25

## 2016-10-15 NOTE — Progress Notes (Signed)
Progress Note    [name]  SWH:[id number] DOB: 1941-04-01  DOA: 09/30/2016 PCP: Leola Brazil, MD    Brief Narrative:   Chief complaint: Follow-up left MCA infarct with hemorrhagic transformation  [name] is an 75 y.o. female with a PMH of CAD status post multiple stents to the LAD, prior history of MI and pericardial effusion, COPD, chronic bronchitis, cirrhosis of the liver without mention of alcohol and uncontrolled type 2 diabetes who was admitted 09/30/16 after being found unresponsive by her family. Upon admission, she had marked elevation of her blood pressure 224/113. She also was found to have nondisplaced fracture of the right superior and inferior pubic rami. 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  Assessment/Plan:   Principal Problem:   Arterial ischemic stroke, MCA (middle cerebral artery), left, acute ([place]), with hemorrhagic transformation Evaluated by neurologist on admission. Noted to have right upper extremity flaccid paralysis on initial presentation. She did not receive IV t-PA due to late presentation. CT confirmed a large left MCA evolving infarct with hemorrhagic transformation. MRI of the brain done 10/04/16 and showed acute infarct with stable hemorrhagic conversion. No midline shift. CTA of the head and neck showed neurologist recommended treatment with 3% normal saline to decrease intracranial pressure. No anti-thrombotic medications recommended secondary hemorrhagic transformation. She was intubated for airway protection. CT angiogram of the neck and head showed hypoattenuation throughout the left MCA, 40% stenosis of the proximal left internal carotid artery, moderate bilateral vertebral artery disease. No cardiac source of emboli identified on 2-D echo. Neurology does recommend a 30 day cardiac event monitor as an outpatient to rule out atrial  fibrillation. Seen by PT/OT on 10/05/16 with recommendations for CIR placement. Will need ongoing risk factor modification.  Active Problems:   Hypertensive emergency in the setting of a history of benign essential hypertension Permissive hypertension recommended by neurology on admission. Home dose of Coreg resumed 10/03/16. Hydralazine added 10/04/16. Antihypertensives titrated to achieve better blood pressure control. HCTZ added 10/12/16. Hold diuretics secondary to a bump in her creatinine.    Pubic rami fractures/Closed fracture of multiple pubic rami, right, sequela Evaluated by orthopedic surgery (Dr. Lyla Glassing) on 09/30/16. Weightbearing as tolerated with a walker with outpatient orthopedic follow-up in 4 weeks recommended.    Possible Pneumonia of left lower lobe due to infectious organism [facility]) The patient spiked a fever of 101.1 on 09/30/16 and her lactic acid was elevated and she was started on empiric vancomycin and Zosyn. Chest x-ray done 09/30/16 showed left airspace consolidation consistent with pneumonia, but follow-up chest x-ray on 10/01/16 showed no consolidation and antibiotics were subsequently discontinued. No current evidence of infection.    Acute respiratory failure with hypoxemia (HCC)/ventilator dependence/tracheostomy status/tachypnea/chronic obstructive pulmonary disease Intubated on admission. Extubated 10/02/16, but could not tolerate this and hadn't be reintubated later that day. Tracheostomy had to be placed 10/07/16 due to inability to wean. Maintained on bronchodilators and Pulmicort. Difficult wean and likely will need LTAC.    Acute on Chronic combined systolic and diastolic heart failure ([place]) 2-D echo done 10/03/16: EF 40-45 percent with evidence of diastolic dysfunction. Currently on Lasix 40 mg twice a day.    Diabetes ([place]) goal hemoglobin A1c less than 7/uncontrolled type 2 diabetes with complications Hemoglobin A1c 8.1 percent. Currently being managed with Lantus  22 units daily, NovoLog 4 units every 4 hours, and SSI every 4 hours. CBGs  174-225. Will increase Lantus to 25 units.    Dysphagia secondary to stroke Evaluated by speech therapist. Persistently dysphasic and ultimately had PEG tube placed 10/13/16. Tube feedings initiated 10/14/16.    Coronary artery disease involving native coronary artery of native heart without angina pectoris    Mixed hyperlipidemia with goal LDL less than 70 LDL 131. Noted to have a history of statin intolerance, but on Pravachol 10 mg twice a week at home. Will add Zetia.    Hypernatremia Secondary to hypertonic saline administration. Resolved with discontinuation of hypertonic saline.    Hypokalemia Electrolytes replaced as needed. Stop routine supplementation given bump in creatinine and discontinuation of Lasix.    Hypoalbuminemia due to protein-calorie malnutrition ([place])    Acute on chronic renal insufficiency Creatinine elevated at 1.45 on admission. Creatinine 1 year ago was around 1.09. Creatinine bump noted today. 1.36---> 2.24. Increase IV fluids and recheck in the morning. Hold Lasix and HCTZ.    Anemia of chronic disease Noted to have a drop in hemoglobin from 8.9---> 7.2 mg/dL on 10/04/16. No obvious source of bleeding found. Hemoglobin trending down again, currently 7.1 mg/dL. Transfuse for hemoglobin less than 7.    Pressure injury of skin Pressure reduction mattress ordered. Nursing care to prevent further pressure injury.    Obesity with BMI 29.59 Dietitian following for tube feeding recommendations.    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   Family Communication/Anticipated D/C date and plan/Code Status   DVT prophylaxis: Subcutaneous heparin ordered. Code Status: Full Code.  Family Communication: Sister updated by telephone 10/14/16. Disposition Plan: Lived with her sister. Will need  LTAC or CIR at discharge.   Medical Consultants:    PCCM  Neurology  Physical Medicine  IR   Procedures:    PICC line placed 10/02/16.  2-D echo done 10/03/16: EF 40-45 percent akinesis of the mid-apicalanteroseptal, anterior, anterolateral, lateral, inferolateral, inferior, inferoseptal, and apical myocardium. Doppler parameters are consistent with abnormal left ventricular relaxation (grade 1 diastolic dysfunction).  Lower extremity Dopplers done 10/04/16: No evidence of DVT.  Tracheostomy placed 10/07/16.  PEG tube placed 10/13/16.  Anti-Infectives:    Vancomycin/Zosyn 10/01/1723 hours.  Subjective:   Patient is nonverbal, lethargic today.  Objective:    Vitals:   10/15/16 0500 10/15/16 0600 10/15/16 0700 10/15/16 0726  BP: (!) 143/87 (!) 119/105 (!) 145/68 (!) 145/68  Pulse: 65 65 60 61  Resp: _0 (!) 22  Temp:      TempSrc:      SpO2: 100% 100% 100% 100%  Weight:      Height:        Intake/Output Summary (Last 24 hours) at 10/15/16 0733 Last data filed at 10/15/16 0700  Gross per 24 hour  Intake          1701.67 ml  Output             2150 ml  Net          -448.33 ml   Filed Weights   10/12/16 0303 10/13/16 0400 10/14/16 0500  Weight: 80.6 kg (177 lb 11.1 oz) 83 kg (182 lb 15.7 oz) 83.5 kg (184 lb 1.4 oz)   Tele: NSR  Exam: General exam: Lethargic today, restraints off. Respiratory system: Scattered rhonchi. Respiratory effort increased with a tracheostomy in place/trach collar. Cardiovascular system: Heart sounds are regular. No JVD,  rubs, gallops or clicks. No murmurs. Gastrointestinal system: Abdomen is nondistended, soft  and nontender. No organomegaly or masses felt. Normal bowel sounds heard. Central nervous system: Alert, aphasic, right-sided facial droop. Unable to follow commands. Extremities: No clubbing,  or cyanosis. RUE edema. Skin: No rashes, lesions or ulcers. Psychiatry: Judgement and insight appear impaired. Mood &  affect depressed/anxious.   Data Reviewed:   I have personally reviewed following labs and imaging studies:  Labs: Basic Metabolic Panel:  Recent Labs Lab 10/12/16 0640 10/12/16 1821 10/13/16 0345 10/14/16 0343 10/15/16 0353  NA 147* 134* 144 139 137  K 3.7 3.4* 3.5 3.4* 4.7  CL 111 101 107 100* 103  CO2 _0 GLUCOSE 202* 547* 146* 120* 156*  BUN 48* 45* 45* 42* 18  CREATININE 1.33* 1.22* 1.25* 1.36* 2.24*  CALCIUM 9.3 8.3* 9.4 9.2 9.2  MG  --   --  2.2 2.1 2.6*  PHOS  --   --  3.8  --   --    GFR Estimated Creatinine Clearance: 23.7 mL/min (by C-G formula based on SCr of 2.24 mg/dL (H)). Liver Function Tests:  Recent Labs Lab 10/11/16 0340  AST 41  ALT 55*  ALKPHOS 112  BILITOT 0.7  PROT 6.1*  ALBUMIN 2.3*   Coagulation profile  Recent Labs Lab 10/13/16 0345  INR 0.94    CBC:  Recent Labs Lab 10/09/16 0430 10/10/16 0320 10/11/16 0340 10/12/16 0317 10/14/16 0343 10/15/16 0353  WBC 8.1 10.0 15.0* 9.1 12.6* 8.2  NEUTROABS 5.8 7.8*  --   --   --   --   HGB 7.7* 11.9* 9.0* 7.5* 8.1* 7.1*  HCT 25.1* 38.5 29.7* 25.2* 26.7* 22.6*  MCV 99.6 98.7 100.3* 101.2* 98.2 86.9  PLT 164 144* 187 182 264 153   Cardiac Enzymes:  Recent Labs Lab 10/13/16 0905  TROPONINI 0.04*   CBG:  Recent Labs Lab 10/14/16 1240 10/14/16 1523 10/14/16 2014 10/15/16 0011 10/15/16 0315  GLUCAP 199* 198* 225* 194* 174*   Sepsis Labs:  Recent Labs Lab 10/11/16 0340 10/12/16 0317 10/14/16 0343 10/15/16 0353  WBC 15.0* 9.1 12.6* 8.2    Microbiology No results found for this or any previous visit (from the past 240 hour(s)).  Radiology: Ir Lewie [facility]on Mod Sed  Result Date: 10/13/2016 INDICATION: History of left MCA infarct with hemorrhagic transformation, now with altered mental status. As such, request made for placement of a gastrostomy tube for long-term enteric nutrition supplementation. EXAM: PULL TROUGH GASTROSTOMY TUBE PLACEMENT  COMPARISON:  None. MEDICATIONS: Ancef 2 gm IV; Antibiotics were administered within 1 hour of the procedure. Glucagon 1 mg IV CONTRAST:  20 cc Isovue 300 administered into the gastric lumen. ANESTHESIA/SEDATION: Moderate (conscious) sedation was employed during this procedure. A total of Versed 2 mg and Fentanyl 100 mcg was administered intravenously. Moderate Sedation Time: 40 minutes. The patient's level of consciousness and vital signs were monitored continuously by radiology nursing throughout the procedure under my direct supervision. FLUOROSCOPY TIME:  4 minutes 12 seconds (915 mGy) COMPLICATIONS: None immediate. PROCEDURE: Informed written consent was obtained from the patient's sister following explanation of the procedure, risks, benefits and alternatives. A time out was performed prior to the initiation of the procedure. Ultrasound scanning was performed to demarcate the edge of the left lobe of the liver. Maximal barrier sterile technique utilized including caps, mask, sterile gowns, sterile gloves, large sterile drape, hand hygiene and Betadine prep. The left upper quadrant was sterilely prepped and draped. An oral gastric catheter was inserted into the stomach under fluoroscopy.  The existing nasogastric feeding tube was removed. The left costal margin and air opacified transverse colon were identified and avoided. Air was injected into the stomach for insufflation and visualization under fluoroscopy. Under sterile conditions a 17 gauge trocar needle was utilized to access the stomach percutaneously beneath the left subcostal margin after the overlying soft tissues were anesthetized with 1% Lidocaine with epinephrine. Needle position was confirmed within the stomach with aspiration of air and injection of small amount of contrast. A single T tack was deployed for gastropexy. Over an Amplatz guide wire, a 9-French sheath was inserted into the stomach. A snare device was utilized to capture the oral  gastric catheter. The snare device was pulled retrograde from the stomach up the esophagus and out the oropharynx. The 20-French pull-through gastrostomy was connected to the snare device and pulled antegrade through the oropharynx down the esophagus into the stomach and then through the percutaneous tract external to the patient. The gastrostomy was assembled externally. Contrast injection confirms position in the stomach. Several spot radiographic images were obtained in various obliquities for documentation. The patient tolerated procedure well without immediate post procedural complication. FINDINGS: After successful fluoroscopic guided placement, the gastrostomy tube is appropriately positioned with internal disc against the ventral aspect of the gastric lumen. IMPRESSION: Successful fluoroscopic insertion of a 20-French pull-through gastrostomy tube. The gastrostomy may be used immediately for medication administration and in 24 hrs for the initiation of feeds. Electronically Signed   By: Sandi Mariscal M.D.   On: 10/13/2016 15:02    Medications:   . budesonide (PULMICORT) nebulizer solution  0.5 mg Nebulization BID  . carvedilol  12.5 mg Oral BID WC  . chlorhexidine gluconate (MEDLINE KIT)  15 mL Mouth Rinse BID  . docusate  100 mg Oral BID  . famotidine  20 mg Per Tube Daily  . feeding supplement (PRO-STAT SUGAR FREE 64)  30 mL Per Tube Daily  . furosemide  40 mg Intravenous BID  . heparin subcutaneous  5,000 Units Subcutaneous Q8H  . hydrALAZINE  100 mg Per Tube Q8H  . hydrochlorothiazide  25 mg Per Tube Daily  . insulin aspart  0-20 Units Subcutaneous Q4H  . insulin aspart  4 Units Subcutaneous Q4H  . insulin glargine  22 Units Subcutaneous Daily  . mouth rinse  15 mL Mouth Rinse QID  . potassium chloride  20 mEq Per Tube BID  . sodium chloride flush  10-40 mL Intracatheter Q12H   Continuous Infusions: . sodium chloride 10 mL/hr at 10/14/16 1528  . feeding supplement (GLUCERNA 1.2 CAL)  1,000 mL (10/14/16 1444)    Medical decision making is of high complexity and this patient is at high risk of deterioration, therefore this is a level 3 visit.   LOS: 15 days   [facility] Pager [phone]. If unable to reach me by pager, please call my cell phone at [phone].  *Please refer to [email or web address], password [email or web address] to get updated schedule on who will round on this patient, as hospitalists switch teams weekly. If 7PM-7AM, please contact night-coverage at [email or web address], password [email or web address] for any overnight needs.  10/15/2016, 7:33 AM

## 2016-10-16 LAB — BASIC METABOLIC PANEL
ANION GAP: 8 (ref 5–15)
BUN: 46 mg/dL — ABNORMAL HIGH (ref 6–20)
CHLORIDE: 103 mmol/L (ref 101–111)
CO2: 27 mmol/L (ref 22–32)
Calcium: 9.3 mg/dL (ref 8.9–10.3)
Creatinine, Ser: 1.36 mg/dL — ABNORMAL HIGH (ref 0.44–1.00)
GFR calc non Af Amer: 37 mL/min — ABNORMAL LOW (ref >60)
GFR, EST AFRICAN AMERICAN: 43 mL/min — AB (ref >60)
Glucose, Bld: 174 mg/dL — ABNORMAL HIGH (ref 65–99)
POTASSIUM: 4.4 mmol/L (ref 3.5–5.1)
SODIUM: 138 mmol/L (ref 135–145)

## 2016-10-16 LAB — GLUCOSE, CAPILLARY
GLUCOSE-CAPILLARY: 177 mg/dL — AB (ref 65–99)
GLUCOSE-CAPILLARY: 153 mg/dL — AB (ref 65–99)
GLUCOSE-CAPILLARY: 219 mg/dL — AB (ref 65–99)
Glucose-Capillary: 164 mg/dL — ABNORMAL HIGH (ref 65–99)
Glucose-Capillary: 201 mg/dL — ABNORMAL HIGH (ref 65–99)
Glucose-Capillary: 216 mg/dL — ABNORMAL HIGH (ref 65–99)

## 2016-10-16 LAB — CBC
HEMATOCRIT: 25.1 % — AB (ref 36.0–46.0)
HEMOGLOBIN: 7.8 g/dL — AB (ref 12.0–15.0)
MCH: 30.5 pg (ref 26.0–34.0)
MCHC: 31.1 g/dL (ref 30.0–36.0)
MCV: 98 fL (ref 78.0–100.0)
Platelets: 256 10*3/uL (ref 150–400)
RBC: 2.56 MIL/uL — AB (ref 3.87–5.11)
RDW: 15.6 % — AB (ref 11.5–15.5)
WBC: 10.4 10*3/uL (ref 4.0–10.5)

## 2016-10-16 LAB — MAGNESIUM: MAGNESIUM: 2.3 mg/dL (ref 1.7–2.4)

## 2016-10-16 MED ORDER — EZETIMIBE 10 MG PO TABS
10.0000 mg | ORAL_TABLET | Freq: Every day | ORAL | Status: DC
  Administered 2016-10-16 – 2016-10-20 (×5): 10 mg
  Filled 2016-10-16 (×5): qty 1

## 2016-10-16 NOTE — Progress Notes (Addendum)
Progress Note    [name]  UUV:[id number] DOB: 1941-09-30  DOA: 09/30/2016 PCP: Leola Brazil, MD    Brief Narrative:   Chief complaint: Follow-up left MCA infarct with hemorrhagic transformation  [name] is an 75 y.o. female with a PMH of CAD status post multiple stents to the LAD, prior history of MI and pericardial effusion, COPD, chronic bronchitis, cirrhosis of the liver without mention of alcohol and uncontrolled type 2 diabetes who was admitted 09/30/16 after being found unresponsive by her family. Upon admission, she had marked elevation of her blood pressure 224/113. She also was found to have nondisplaced fracture of the right superior and inferior pubic rami. 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  Assessment/Plan:   Principal Problem:   Arterial ischemic stroke, MCA (middle cerebral artery), left, acute ([place]), with hemorrhagic transformation Evaluated by neurologist on admission. Noted to have right upper extremity flaccid paralysis on initial presentation. She did not receive IV t-PA due to late presentation. CT confirmed a large left MCA evolving infarct with hemorrhagic transformation. MRI of the brain done 10/04/16 and showed acute infarct with stable hemorrhagic conversion. No midline shift. Neurologist recommended treatment with 3% normal saline to decrease intracranial pressure. No anti-thrombotic medications recommended secondary hemorrhagic transformation. She was intubated for airway protection. CT angiogram of the neck and head showed hypoattenuation throughout the left MCA, 40% stenosis of the proximal left internal carotid artery, moderate bilateral vertebral artery disease. No cardiac source of emboli identified on 2-D echo. Neurology does recommend a 30 day cardiac event monitor as an outpatient to rule out atrial fibrillation. Seen by  PT/OT on 10/05/16 with recommendations for CIR placement. Will need ongoing risk factor modification. Given ongoing severe debility, will request a palliative care consultation, as I doubt she will be a candidate for CIR, as it is unlikely she can tolerate/participate in 3 hours of therapy/day.  Active Problems:   Hypertensive emergency in the setting of a history of benign essential hypertension Permissive hypertension recommended by neurology on admission. Home dose of Coreg resumed 10/03/16. Hydralazine added 10/04/16. Antihypertensives titrated to achieve better blood pressure control. HCTZ added 10/12/16. Diuretics held 10/15/16 secondary to a bump in her creatinine. Could consider resuming if needed as creatinine back to baseline.    Pubic rami fractures/Closed fracture of multiple pubic rami, right, sequela Evaluated by orthopedic surgery (Dr. Lyla Glassing) on 09/30/16. Weightbearing as tolerated with a walker with outpatient orthopedic follow-up in 4 weeks recommended.    Possible Pneumonia of left lower lobe due to infectious organism [facility]) The patient spiked a fever of 101.1 on 09/30/16 and her lactic acid was elevated and she was started on empiric vancomycin and Zosyn. Chest x-ray done 09/30/16 showed left airspace consolidation consistent with pneumonia, but follow-up chest x-ray on 10/01/16 showed no consolidation and antibiotics were subsequently discontinued. No current evidence of infection.    Acute respiratory failure with hypoxemia (HCC)/ventilator dependence/tracheostomy status/tachypnea/chronic obstructive pulmonary disease Intubated on admission. Extubated 10/02/16, but could not tolerate this and hadn't be reintubated later that day. Tracheostomy had to be placed 10/07/16 due to inability to wean. Maintained on bronchodilators and Pulmicort. Difficult wean and likely will need LTAC.    Acute on Chronic combined systolic and diastolic heart failure ([place]) 2-D echo done 10/03/16: EF 40-45  percent with evidence of diastolic dysfunction. Lasix on hold.    Diabetes ([place])  goal hemoglobin A1c less than 7/uncontrolled type 2 diabetes with complications Hemoglobin A1c 8.1 percent. Currently being managed with Lantus 25 units daily, NovoLog 4 units every 4 hours, and SSI every 4 hours. CBGs 153-261.    Dysphagia secondary to stroke Evaluated by speech therapist. Persistently dysphasic and ultimately had PEG tube placed 10/13/16. Tube feedings initiated 10/14/16.    Coronary artery disease involving native coronary artery of native heart without angina pectoris    Mixed hyperlipidemia with goal LDL less than 70 LDL 131. Noted to have a history of statin intolerance, but on Pravachol 10 mg twice a week at home. Will add Zetia.    Hypernatremia Secondary to hypertonic saline administration. Resolved with discontinuation of hypertonic saline.    Hypokalemia Electrolytes replaced as needed.     Hypoalbuminemia due to protein-calorie malnutrition ([place])    Acute on chronic renal insufficiency Creatinine elevated at 1.45 on admission. Creatinine 1 year ago was around 1.09. Creatinine bump noted 10/15/16: 1.36---> 2.24. Creatinine back to 1.36 this morning after holding Lasix and hydrating.    Anemia of chronic disease Noted to have a drop in hemoglobin from 8.9---> 7.2 mg/dL on 10/04/16. No obvious source of bleeding found. Hemoglobin currently 7.8 mg/dL. Transfuse for hemoglobin less than 7.    Pressure injury of skin Pressure reduction mattress ordered. Nursing care to prevent further pressure injury.    Obesity with BMI 29.59 Dietitian following for tube feeding recommendations.    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   Family Communication/Anticipated D/C date and plan/Code Status   DVT prophylaxis: Subcutaneous heparin ordered. Code Status: Full Code.  Family  Communication: Sister updated by telephone, agreeable to speak with palliative care team. Disposition Plan: Lived with her sister. Will need LTAC or CIR at discharge.   Medical Consultants:    PCCM  Neurology  Physical Medicine  IR   Procedures:    PICC line placed 10/02/16.  2-D echo done 10/03/16: EF 40-45 percent akinesis of the mid-apicalanteroseptal, anterior, anterolateral, lateral, inferolateral, inferior, inferoseptal, and apical myocardium. Doppler parameters are consistent with abnormal left ventricular relaxation (grade 1 diastolic dysfunction).  Lower extremity Dopplers done 10/04/16: No evidence of DVT.  Tracheostomy placed 10/07/16.  PEG tube placed 10/13/16.  Anti-Infectives:    Vancomycin/Zosyn 10/01/1723 hours.  Subjective:   Patient is nonverbal, lethargic today. Unable to follow commands.  Objective:    Vitals:   10/16/16 0038 10/16/16 0435 10/16/16 0440 10/16/16 0619  BP:    (!) 144/71  Pulse:      Resp:      Temp: 99.6 F (37.6 C)  99.6 F (37.6 C)   TempSrc: Oral  Oral   SpO2:      Weight:  56.5 kg (124 lb 9 oz)    Height:        Intake/Output Summary (Last 24 hours) at 10/16/16 0722 Last data filed at 10/16/16 3536  Gross per 24 hour  Intake           2973.5 ml  Output             1630 ml  Net           1343.5 ml   Filed Weights   10/14/16 0500 10/15/16 0500 10/16/16 0435  Weight: 83.5 kg (184 lb 1.4 oz) 82.7 kg (182 lb 5.1 oz) 56.5 kg (124 lb 9 oz)   Tele: NSR at 65 bpm  Exam: General exam: Lethargic  today, restraints off. Furrows her brow when awakened, nods but non-verbal. Respiratory system: Scattered rhonchi. Respiratory effort increased with a tracheostomy in place/trach collar. Cardiovascular system: Heart sounds are regular. No JVD,  rubs, gallops or clicks. No murmurs. Gastrointestinal system: Abdomen is nondistended, soft and nontender. No organomegaly or masses felt. Normal bowel sounds heard. Central nervous  system: Alert, aphasic, right-sided facial droop. Unable to follow commands. Disconjugate gaze. Extremities: No clubbing,  or cyanosis. RUE edema. Skin: No rashes, lesions or ulcers. Psychiatry: Judgement and insight appear impaired. Mood & affect depressed/anxious.   Data Reviewed:   I have personally reviewed following labs and imaging studies:  Labs: Basic Metabolic Panel:  Recent Labs Lab 10/12/16 1821 10/13/16 0345 10/14/16 0343 10/15/16 0353 10/16/16 0310  NA 134* 144 139 137 138  K 3.4* 3.5 3.4* 4.7 4.4  CL 101 107 100* 103 103  CO2 _0 GLUCOSE 547* 146* 120* 156* 174*  BUN 45* 45* 42* 18 46*  CREATININE 1.22* 1.25* 1.36* 2.24* 1.36*  CALCIUM 8.3* 9.4 9.2 9.2 9.3  MG  --  2.2 2.1 2.6* 2.3  PHOS  --  3.8  --   --   --    GFR Estimated Creatinine Clearance: 31.4 mL/min (by C-G formula based on SCr of 1.36 mg/dL (H)). Liver Function Tests:  Recent Labs Lab 10/11/16 0340  AST 41  ALT 55*  ALKPHOS 112  BILITOT 0.7  PROT 6.1*  ALBUMIN 2.3*   Coagulation profile  Recent Labs Lab 10/13/16 0345  INR 0.94    CBC:  Recent Labs Lab 10/10/16 0320 10/11/16 0340 10/12/16 0317 10/14/16 0343 10/15/16 0353 10/16/16 0310  WBC 10.0 15.0* 9.1 12.6* 8.2 10.4  NEUTROABS 7.8*  --   --   --   --   --   HGB 11.9* 9.0* 7.5* 8.1* 7.1* 7.8*  HCT 38.5 29.7* 25.2* 26.7* 22.6* 25.1*  MCV 98.7 100.3* 101.2* 98.2 86.9 98.0  PLT 144* 187 182 264 153 256   Cardiac Enzymes:  Recent Labs Lab 10/13/16 0905  TROPONINI 0.04*   CBG:  Recent Labs Lab 10/15/16 1310 10/15/16 1551 10/15/16 1927 10/16/16 0037 10/16/16 0439  GLUCAP 261* 216* 153* 164* 177*   Sepsis Labs:  Recent Labs Lab 10/12/16 0317 10/14/16 0343 10/15/16 0353 10/16/16 0310  WBC 9.1 12.6* 8.2 10.4    Microbiology No results found for this or any previous visit (from the past 240 hour(s)).  Radiology: No results found.  Medications:   . budesonide (PULMICORT) nebulizer  solution  0.5 mg Nebulization BID  . carvedilol  12.5 mg Oral BID WC  . chlorhexidine gluconate (MEDLINE KIT)  15 mL Mouth Rinse BID  . docusate  100 mg Oral BID  . famotidine  20 mg Per Tube Daily  . feeding supplement (PRO-STAT SUGAR FREE 64)  30 mL Per Tube Daily  . heparin subcutaneous  5,000 Units Subcutaneous Q8H  . hydrALAZINE  100 mg Per Tube Q8H  . insulin aspart  0-20 Units Subcutaneous Q4H  . insulin aspart  4 Units Subcutaneous Q4H  . insulin glargine  25 Units Subcutaneous Daily  . mouth rinse  15 mL Mouth Rinse QID  . sodium chloride flush  10-40 mL Intracatheter Q12H   Continuous Infusions: . sodium chloride 75 mL/hr at 10/16/16 0642  . feeding supplement (GLUCERNA 1.2 CAL) 1,000 mL (10/15/16 4585)    Medical decision making is of high complexity and this patient is at high risk of  deterioration, therefore this is a level 3 visit.   LOS: 16 days   [facility] Pager [phone]. If unable to reach me by pager, please call my cell phone at [phone].  *Please refer to [email or web address], password [email or web address] to get updated schedule on who will round on this patient, as hospitalists switch teams weekly. If 7PM-7AM, please contact night-coverage at [email or web address], password [email or web address] for any overnight needs.  10/16/2016, 7:22 AM

## 2016-10-16 NOTE — Progress Notes (Signed)
   10/16/16 2325  Clinical Encounter Type  Visited With Patient  Visit Type Spiritual support  Referral From Nurse  Consult/Referral To Chaplain  Spiritual Encounters  Spiritual Needs Sacred text  Stress Factors  Patient Stress Factors Health changes  Nurse reported Pt feeling restless and feels prayer and scripture to Pt. Read Psalm 23 and offered prayer. Nurse stated family requesting daily visits of scripture reading to Pt.

## 2016-10-17 DIAGNOSIS — Z430 Encounter for attention to tracheostomy: Secondary | ICD-10-CM

## 2016-10-17 LAB — GLUCOSE, CAPILLARY
GLUCOSE-CAPILLARY: 113 mg/dL — AB (ref 65–99)
GLUCOSE-CAPILLARY: 149 mg/dL — AB (ref 65–99)
GLUCOSE-CAPILLARY: 170 mg/dL — AB (ref 65–99)
GLUCOSE-CAPILLARY: 207 mg/dL — AB (ref 65–99)
Glucose-Capillary: 119 mg/dL — ABNORMAL HIGH (ref 65–99)
Glucose-Capillary: 159 mg/dL — ABNORMAL HIGH (ref 65–99)

## 2016-10-17 LAB — BASIC METABOLIC PANEL
Anion gap: 8 (ref 5–15)
BUN: 45 mg/dL — AB (ref 6–20)
CHLORIDE: 105 mmol/L (ref 101–111)
CO2: 27 mmol/L (ref 22–32)
Calcium: 9.3 mg/dL (ref 8.9–10.3)
Creatinine, Ser: 1.33 mg/dL — ABNORMAL HIGH (ref 0.44–1.00)
GFR calc Af Amer: 44 mL/min — ABNORMAL LOW (ref >60)
GFR calc non Af Amer: 38 mL/min — ABNORMAL LOW (ref >60)
GLUCOSE: 108 mg/dL — AB (ref 65–99)
POTASSIUM: 4.5 mmol/L (ref 3.5–5.1)
Sodium: 140 mmol/L (ref 135–145)

## 2016-10-17 MED ORDER — POLYETHYLENE GLYCOL 3350 17 G PO PACK
17.0000 g | PACK | Freq: Every day | ORAL | Status: DC | PRN
  Filled 2016-10-17: qty 1

## 2016-10-17 MED ORDER — DOCUSATE SODIUM 50 MG/5ML PO LIQD
100.0000 mg | Freq: Two times a day (BID) | ORAL | Status: DC
  Administered 2016-10-17 – 2016-10-20 (×6): 100 mg
  Filled 2016-10-17 (×7): qty 10

## 2016-10-17 MED ORDER — PREDNISONE 5 MG PO TABS
5.0000 mg | ORAL_TABLET | Freq: Every day | ORAL | Status: DC
  Administered 2016-10-17 – 2016-10-19 (×3): 5 mg
  Filled 2016-10-17 (×3): qty 1

## 2016-10-17 MED ORDER — ACETAMINOPHEN 160 MG/5ML PO SOLN: 650.0000 mg | Freq: Four times a day (QID) | ORAL | Status: DC | PRN

## 2016-10-17 MED ORDER — TRAMADOL HCL 50 MG PO TABS
50.0000 mg | ORAL_TABLET | Freq: Four times a day (QID) | ORAL | Status: DC | PRN
  Administered 2016-10-17 – 2016-10-20 (×6): 50 mg
  Filled 2016-10-17 (×6): qty 1

## 2016-10-17 MED ORDER — ALPRAZOLAM 0.25 MG PO TABS
0.2500 mg | ORAL_TABLET | Freq: Two times a day (BID) | ORAL | Status: DC
  Administered 2016-10-17 – 2016-10-20 (×7): 0.25 mg
  Filled 2016-10-17 (×7): qty 1

## 2016-10-17 MED ORDER — DORZOLAMIDE HCL 2 % OP SOLN
1.0000 [drp] | Freq: Two times a day (BID) | OPHTHALMIC | Status: DC
  Administered 2016-10-17 – 2016-10-20 (×7): 1 [drp] via OPHTHALMIC
  Filled 2016-10-17: qty 10

## 2016-10-17 MED ORDER — FENTANYL CITRATE (PF) 100 MCG/2ML IJ SOLN
50.0000 ug | INTRAMUSCULAR | Status: DC | PRN
  Administered 2016-10-19 (×3): 50 ug via INTRAVENOUS
  Filled 2016-10-17 (×3): qty 2

## 2016-10-17 MED ORDER — ALPRAZOLAM 0.25 MG PO TABS
0.2500 mg | ORAL_TABLET | Freq: Three times a day (TID) | ORAL | Status: DC | PRN
  Administered 2016-10-20: 0.25 mg
  Filled 2016-10-17: qty 1

## 2016-10-17 MED ORDER — ARTIFICIAL TEARS OP OINT
1.0000 "application " | TOPICAL_OINTMENT | Freq: Two times a day (BID) | OPHTHALMIC | Status: DC
  Administered 2016-10-17 – 2016-10-20 (×7): 1 via OPHTHALMIC
  Filled 2016-10-17 (×2): qty 3.5

## 2016-10-17 MED ORDER — CARVEDILOL 12.5 MG PO TABS
12.5000 mg | ORAL_TABLET | Freq: Two times a day (BID) | ORAL | Status: DC
  Administered 2016-10-17 – 2016-10-20 (×6): 12.5 mg
  Filled 2016-10-17 (×7): qty 1

## 2016-10-17 NOTE — Progress Notes (Addendum)
[place] TEAM 1 - Stepdown/ICU TEAM  GWENDOLINE JUDY  BOF:[id number] DOB: [birth year] DOA: 09/30/2016 PCP: Leola Brazil, MD    Brief Narrative:  75 y.o. female with a Hx of CAD status post multiple stents to the LAD, prior history of MI and pericardial effusion, COPD, chronic bronchitis, cirrhosis of the liver without mention of alcohol, and uncontrolled type 2 diabetes who was admitted 09/30/16 after being found unresponsive by her family. Upon admission she had marked elevation of her blood pressure at 224/113. She also was found to have a nondisplaced fracture of the right superior and inferior pubic rami. 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  Subjective: The patient is not able to effectively communicate with me.  She grimaces when I touch her knees or move her legs.  She grimaces when I palpate her abdomen deeply.  She does not appear to be in acute respiratory distress.  She does not follow simple commands.  She will not open her eyes.  Assessment & Plan:  Acute L MCA CVA with hemorrhagic transformation Evaluated by neurologist on admission. Noted to have right upper extremity flaccid paralysis on initial presentation. Shedidnot receive IV t-PA due to late presentation. CT confirmed a large left MCA evolving infarct with hemorrhagic transformation. MRI of the brain 10/04/16 showed acute infarct with stable hemorrhagic conversion. No midline shift. Neurologist recommended treatment with 3% normal saline to decrease intracranial pressure. No anti-thrombotic medications recommended secondary hemorrhagic transformation. She was intubated for airway protection. CT angiogram of the neck and head showed hypoattenuation throughout the left MCA, 40% stenosis of the proximal left internal carotid artery, moderate bilateral vertebral artery disease. No cardiac source of emboli identified on  2-D echo. Neurology does recommend a 30 day cardiac event monitor as an outpatient to rule out atrial fibrillation. Seen by PT/OT on 10/05/16 with recommendations for CIR placement. Will need ongoing risk factor modification. Given ongoing severe debility PC consulted to address [place].  Hypertensive emergency Permissive hypertension recommended by neurology on admission. Home dose of Coreg resumed 10/03/16. Hydralazine added 10/04/16. Antihypertensives titrated to achieve better blood pressure control. HCTZ added 10/12/16. Diuretics held 10/15/16 secondary to a bump in her creatinine. BP currently reasonably controlled.    Pubic rami fractures/Closed fracture of multiple pubic rami, right Evaluated by orthopedic surgery (Dr. Lyla Glassing) on 09/30/16. Weightbearing as tolerated with a walker with outpatient orthopedic follow-up in 4 weeks recommended.  Possible Pneumonia of left lower lobe  The patient spiked a fever of 101.1 on 09/30/16 and her lactic acid was elevated and she was started on empiric vancomycin and Zosyn. Chest x-ray done 09/30/16 showed left airspace consolidation consistent with pneumonia, but follow-up chest x-ray on 10/01/16 showed no consolidation and antibiotics were subsequently discontinued. No current evidence of infection.  Acute respiratory failure with hypoxemia / ventilator dependence / tracheostomy status  Intubated on admission. Extubated 10/02/16, but could not tolerate this and had to be reintubated later that day. Tracheostomy had to be placed 10/07/16 due to inability to wean. Maintained on bronchodilators and Pulmicort. Difficult wean and likely will need LTAC.  Acute on Chronic combined systolic and diastolic heart failure 2-D echo 10/03/16 noted EF 40-45% with evidence of diastolic dysfunction.  No evidence of volume overload at this time.  Uncontrolled type 2 diabetes with complications D7O 8.1 - CBG controlled  Dysphagia secondary to stroke Evaluated by speech  therapist. Persistent dysphagia >  PEG tube placed 10/13/16. Tube feedings initiated 10/14/16.  Coronary artery disease   Mixed hyperlipidemia  LDL 131. Noted to have a history of statin intolerance, but on Pravachol 10 mg twice a week at home. Added Zetia.  Hypernatremia Secondary to hypertonic saline administration. Resolved with discontinuation of hypertonic saline.  Hypokalemia Electrolytes replaced as needed.   Protein-calorie malnutrition Continue nutrition per PEG tube.  Acute on chronic renal insufficiency Creatinine elevated at 1.45 on admission. Creatinine 1 year ago was around 1.09. Creatinine bump noted 10/15/16: 1.36---> 2.24. Creatinine stable presently at approximate 1.3  Anemia of chronic disease Noted to have a drop in hemoglobin from 8.9---> 7.2 mg/dL on 10/04/16. No obvious source of bleeding found. Hemoglobin currently stable.  Follow intermittently   Pressure injury of skin Pressure reduction mattress ordered. Nursing care to prevent further pressure injury.  PVD / Occluded left external iliac artery / aneurysmal dilatation of left common iliac / atherosclerosis History of left anterior foot amputation  DVT prophylaxis: SQ heparin  Code Status: FULL CODE Family Communication: no family present at time of exam  Disposition Plan: disposition unclear at present - [place] pending for today - not clear LTAC is appropriate until [place] clearly established as pt appears presently to be most appropriate for comfort care w/ terminal wean in my opinion  Consultants:  PCCM Neurology Physical Medicine IR  Procedures: PICC line placed 10/02/16.  2-D echo done 10/03/16: EF 40-45 percent akinesis of the mid-apicalanteroseptal, anterior, anterolateral, lateral,inferolateral, inferior, inferoseptal, and apical myocardium. Doppler parameters are consistent with abnormal left ventricular relaxation (grade 1 diastolic dysfunction).  Lower extremity Dopplers done 10/04/16: No  evidence of DVT.  Tracheostomy placed 10/07/16.  PEG tube placed 10/13/16.  Antimicrobials:  Vancomycin + Zosyn 11/3  Objective: Blood pressure (!) 130/55, pulse 67, temperature 99.8 F (37.7 C), temperature source Oral, resp. rate (!) 26, height 5' 7" (1.702 m), weight 56.5 kg (124 lb 9 oz), SpO2 100 %.  Intake/Output Summary (Last 24 hours) at 10/17/16 0922 Last data filed at 10/17/16 0900  Gross per 24 hour  Intake             3050 ml  Output             1560 ml  Net             1490 ml   Filed Weights   10/14/16 0500 10/15/16 0500 10/16/16 0435  Weight: 83.5 kg (184 lb 1.4 oz) 82.7 kg (182 lb 5.1 oz) 56.5 kg (124 lb 9 oz)    Examination: General: No acute respiratory distress evident Lungs: Clear to auscultation bilaterally without wheezes or crackles Cardiovascular: Regular rate and rhythm without murmur gallop or rub normal S1 and S2 Abdomen: Nondistended, soft, bowel sounds positive, no appreciable mass Extremities: No significant cyanosis, clubbing, or edema bilateral lower extremities  CBC:  Recent Labs Lab 10/11/16 0340 10/12/16 0317 10/14/16 0343 10/15/16 0353 10/16/16 0310  WBC 15.0* 9.1 12.6* 8.2 10.4  HGB 9.0* 7.5* 8.1* 7.1* 7.8*  HCT 29.7* 25.2* 26.7* 22.6* 25.1*  MCV 100.3* 101.2* 98.2 86.9 98.0  PLT 187 182 264 153 270   Basic Metabolic Panel:  Recent Labs Lab 10/13/16 0345 10/14/16 0343 10/15/16 0353 10/16/16 0310 10/17/16 0445  NA 144 139 137 138 140  K 3.5 3.4* 4.7 4.4 4.5  CL 107 100* 103 103 105  CO2 _0 GLUCOSE 146* 120* 156* 174* 108*  BUN 45* 42* 18 46* 45*  CREATININE 1.25* 1.36* 2.24* 1.36* 1.33*  CALCIUM 9.4 9.2 9.2 9.3 9.3  MG 2.2 2.1 2.6* 2.3  --   PHOS 3.8  --   --   --   --    GFR: Estimated Creatinine Clearance: 32.1 mL/min (by C-G formula based on SCr of 1.33 mg/dL (H)).  Liver Function Tests:  Recent Labs Lab 10/11/16 0340  AST 41  ALT 55*  ALKPHOS 112  BILITOT 0.7  PROT 6.1*  ALBUMIN 2.3*      Coagulation Profile:  Recent Labs Lab 10/13/16 0345  INR 0.94    Cardiac Enzymes:  Recent Labs Lab 10/13/16 0905  TROPONINI 0.04*    HbA1C: Hgb A1c MFr Bld  Date/Time Value Ref Range Status  10/01/2016 04:13 AM 8.1 (H) 4.8 - 5.6 % Final    Comment:    (NOTE)         Pre-diabetes: 5.7 - 6.4         Diabetes: >6.4         Glycemic control for adults with diabetes: <7.0   01/03/2013 06:10 AM 7.2 (H) <5.7 % Final    Comment:    (NOTE)                                                                       According to the ADA Clinical Practice Recommendations for 2011, when HbA1c is used as a screening test:  >=6.5%   Diagnostic of Diabetes Mellitus           (if abnormal result is confirmed) 5.7-6.4%   Increased risk of developing Diabetes Mellitus References:Diagnosis and Classification of Diabetes Mellitus,Diabetes CBSW,9675,91(MBWGY 1):S62-S69 and Standards of Medical Care in         Diabetes - 2011,Diabetes KZLD,3570,17 (Suppl 1):S11-S61.    CBG:  Recent Labs Lab 10/16/16 1711 10/16/16 1918 10/17/16 0039 10/17/16 0346 10/17/16 0822  GLUCAP 216* 219* 149* 113* 119*    Scheduled Meds: . budesonide (PULMICORT) nebulizer solution  0.5 mg Nebulization BID  . carvedilol  12.5 mg Oral BID WC  . chlorhexidine gluconate (MEDLINE KIT)  15 mL Mouth Rinse BID  . docusate  100 mg Oral BID  . ezetimibe  10 mg Per Tube Daily  . famotidine  20 mg Per Tube Daily  . feeding supplement (PRO-STAT SUGAR FREE 64)  30 mL Per Tube Daily  . heparin subcutaneous  5,000 Units Subcutaneous Q8H  . hydrALAZINE  100 mg Per Tube Q8H  . insulin aspart  0-20 Units Subcutaneous Q4H  . insulin aspart  4 Units Subcutaneous Q4H  . insulin glargine  25 Units Subcutaneous Daily  . mouth rinse  15 mL Mouth Rinse QID  . sodium chloride flush  10-40 mL Intracatheter Q12H   Continuous Infusions: . sodium chloride 75 mL/hr at 10/16/16 2056  . feeding supplement (GLUCERNA 1.2 CAL) 1,000  mL (10/17/16 0138)     LOS: 17 days   Cherene Altes, MD [facility] Office  [phone] Pager - Text Page per Amion as per below:  On-Call/Text Page:      [email or web address]      password [email or web address]  If 7PM-7AM, please contact night-coverage [email or web address] Password [email or web address] 10/17/2016, 9:22 AM

## 2016-10-17 NOTE — Care Management Note (Addendum)
Case Management Note Original note created by Julie Amerson 10/06/16  Patient Details  Name: [name] MRN: [id number] Date of Birth: 07/29/1941  Subjective/Objective:  Pt admitted on 09/30/16 s/p Lt MCA infarct.  PTA, pt independent, lives with family members.                    Action/Plan: Pt remains intubated currently.  Planning possible tracheostomy on Friday, 11/10.  Will follow progress.    Expected Discharge Date:                  Expected Discharge Plan:  Long Term Acute Care (LTAC)  In-House Referral:     Discharge planning Services  CM Consult  Post Acute Care Choice:    Choice offered to:     DME Arranged:    DME Agency:     HH Arranged:    HH Agency:     Status of Service:  In process, will continue to follow  If discussed at Long Length of Stay Meetings, dates discussed:    Additional Comments: 10/17/2016  Critical Care doctor requested to perform Peer to Peer prior to 3pm - pt is currently under [facility] service  9:50 am; CM text paged Dr Mcclung (attending) and provided appeal information  - requested peer to peer be called in prior to 3pm today and followup with CM  10/14/16 LTACH denied by insurance.  CM informed attending Rama and provided peer to peer number [phone] - has to be done prior to 11/20 at 3pm.  CM paged attending Dr Rama 2 additional times regarding peer to peer request - CM did not receive a response  10/12/16 Attending discussed and answered questions of sister and [facility] officially [facility].  [facility] has began insurance auth.  Discussed in LOS 10/13/16; remains appropriate for continued stay  CM again requested attending to consult with pt's sister so that determination of LTACH could be made.  Attempt at contact was made by attending however sister was unreachable via phone - will continue to try.  Pt is scheduled for PEG tube placement tomorrow  10/11/16 [facility] liason met with sister at bedside.  Sister requested  attending conference prior to choosing which facility - attending paged  Discussed in LOS 11/14:  Pt remains appropriate for continued stay and deemed appropriate for LTACH referral.  CM provided [facility] both [facility].  CM spoke with both brother Herman and sister [name] and choice for LTACH was given.  S[facility] immediately chose [facility] due to location - sister is in agreement to meet with [facility] today at 1pm.  CM will inform [facility] of selection.  Pt stays with sister and per sister; pt was completely independent prior to admit   10/10/16 Pt transferred to 2M over weekend.  Pt trached 10/07/16 - remains on ventilator 30%, sedation discontinued earlier this am, tube feeds on hold awaiting access - may need PEG at some point.  Creatinine rising.  Attending requested LTACH referral - CM called referral in to physician advisor to determine appropriateness.  PT recommending CIR; however pt is not currently participating in therapies 

## 2016-10-17 NOTE — Progress Notes (Signed)
Inpatient Rehabilitation  Pt. back on full vent support.  Following at a distance however pt. will likely not progress enough for an IP rehab admission.  [facility] Admissions Coordinator Cell [phone] Office [phone]

## 2016-10-17 NOTE — Care Management Important Message (Signed)
Important Message  Patient Details  Name: [name] MRN: [id number] Date of Birth: 01-04-1941   Medicare Important Message Given:  Yes    [name] 10/17/2016, 10:19 AM

## 2016-10-17 NOTE — Progress Notes (Signed)
eLink Physician-Brief Progress Note Patient Name: [name] DOB: 1941/01/21 MRN: [id number]   Date of Service  10/17/2016  HPI/Events of Note  Patient not getting set TV back from ventilator c/w cuff leak. Patient is trach and will need evaluation at bedside for larger trach tube vs longer trach tube.   eICU Interventions  Will notify Dr. Corrie Dandy who is on-call for the bedside.      Intervention Category Intermediate Interventions: Respiratory distress - evaluation and management  Sommer,Steven Eugene 10/17/2016, 5:12 PM

## 2016-10-17 NOTE — Progress Notes (Signed)
PULMONARY / CRITICAL CARE MEDICINE   Name: [name] MRN: [id number] DOB: 11-21-1941    ADMISSION DATE:  09/30/2016  REFERRING MD:  Dr. Cletus Gash, ER  CHIEF COMPLAINT:  Rt sided weakness.  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   CULTURES: Blood 11/03 >>neg Urine 11/03 >> neg  ANTIBIOTICS: Vancomycin 11/03 >> 11/03 Zosyn 11/03 >> 11/03   SIGNIFICANT EVENTS: 11/03 Admit, neuro consulted 11/04 Start 3% NS 11/05 Extubated >> reintubated 11/10 tracheostomy placement  LINES/TUBES: ETT 11/03 >> 11/05 ETT 11/05 >> 11/10 Lt PICC 11/05 >>   SUBJECTIVE:   Uncomfortable during trach cholar. Nods to pain but not specific where she hurts. She is on two point restraint.  No weaning on ATC over the weekend.   VITAL SIGNS: BP (!) 150/135   Pulse 72   Temp 100 F (37.8 C) (Oral)   Resp (!) 31   Ht 5\' 7"  (1.702 m)   Wt 56.5 kg (124 lb 9 oz)   SpO2 99%   BMI 19.51 kg/m   VENTILATOR SETTINGS: Vent Mode: PRVC FiO2 (%):  [40 %] 40 % Set Rate:  [16 bmp] 16 bmp Vt Set:  [500 mL] 500 mL PEEP:  [5 cmH20] 5 cmH20 Plateau Pressure:  [19 cmH20-32  cmH20] 19 cmH20  INTAKE / OUTPUT:  Intake/Output Summary (Last 24 hours) at 10/17/16 1427 Last data filed at 10/17/16 1300  Gross per 24 hour  Intake          2920.25 ml  Output             1370 ml  Net          1550.25 ml    PHYSICAL EXAMINATION: General: appears comfortable except when providing trach care  Neuro: awake, opens eyes, intermittently follows command, appears to be expressive and receptive aphasia,  HEENT: tracheostomy in place, right sided facial droop  Cardiovascular:  RRR, no mgr Lungs:  On trach collar, scattered rhonchi, no accessory use  Abdomen:  Soft, BS+, soft, non tender Skin:  Chronic venous stasis changes  LABS:  BMET  Recent Labs Lab 10/15/16 0353 10/16/16 0310 10/17/16 0445  NA 137 138 140  K 4.7 4.4 4.5  CL 103 103 105  CO2 26 27 27   BUN 18 46* 45*  CREATININE 2.24* 1.36* 1.33*  GLUCOSE 156* 174* 108*    Electrolytes  Recent Labs Lab 10/13/16 0345 10/14/16 0343 10/15/16 0353 10/16/16 0310 10/17/16 0445  CALCIUM 9.4 9.2 9.2 9.3 9.3  MG 2.2 2.1 2.6* 2.3  --   PHOS 3.8  --   --   --   --     CBC  Recent Labs Lab 10/14/16 0343 10/15/16 0353 10/16/16 0310  WBC 12.6* 8.2 10.4  HGB 8.1* 7.1* 7.8*  HCT 26.7* 22.6* 25.1*  PLT 264 153 256    Coag's  Recent Labs Lab 10/13/16 0345  INR 0.94    Sepsis Markers No results for input(s): LATICACIDVEN, PROCALCITON, O2SATVEN in the last 168 hours.  ABG No results for input(s): PHART, PCO2ART, PO2ART in the last 168 hours.  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 Tracheostomy status  Hx of COPD/asthma Dysphagia secondary to CVA AoCKD 2: improving Pubic rami fracture DM type II Anemia Anxiety  Discussion This is a 75 year old female s/p Left MCA stroke w/ hemorrhagic conversion. She is now s/p trach d/t inability to protect airway. We are working on weaning from vent. Additional significant barriers currently include: both expressive and receptive aphasia, probable dysphagia, inability to protect airway and deconditioning. PST has been slowed down by pt becoming "tachypneic" while on ATC.    Plan Cont vent support. ATC as tolerated. Anticipate may need LTAC.  Agree with addressing anxiety as it definitely contributes to tachypnea while on PST.  Cont pulmicort BID and atroven QID.  Cont TF. Keep o2 sats > 88%.  Keep euvolemic. Off abx.  No family at bedside.   Monica Becton, MD 10/17/2016, 2:31 PM [facility] Pager [phone] After 3 pm or if no answer, call [phone]

## 2016-10-17 NOTE — Progress Notes (Signed)
No charge note.  Palliative medicine consult received. Meeting arranged with patient's sister, Helene Kelp. Scheduled for 1pm tomorrow, Tuesday 11/21.  Mariana Kaufman, AGNP-C Palliative Medicine  Please call Palliative Medicine team phone with any questions [phone]. For individual providers please see AMION.

## 2016-10-17 NOTE — Progress Notes (Signed)
Physical Therapy Treatment Patient Details Name: [name] MRN: [id number] DOB: 1940/12/31 Today's Date: 10/17/2016    History of Present Illness Patient is a 75 y/o female with hx of DM, PVD, PAD, MI, HTN, gout, COPD, CAD, lower limb ischemia presents with right sided weakness. 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 Found to have Nondisplaced fracture of the right superior and inferior pubic rami. 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     PT Comments    Pt with grimace throughout session, unable to localize, appears internally distracted. Pt pushing with bil UE , turning head to right and unable to tolerate EOB or attempt standing today. Pt on vent with difficulty maintaining attachment to trach. Pt not as interactive and progressive with mobility as last session. Will continue to follow.     Follow Up Recommendations  Supervision/Assistance - 24 hour;LTACH     Equipment Recommendations       Recommendations for Other Services       Precautions / Restrictions Precautions Precautions: Fall Precaution Comments: vent, trach, peg Restrictions Weight Bearing Restrictions: No    Mobility  Bed Mobility Overal bed mobility: Needs Assistance Bed Mobility: Supine to Sit;Sit to Supine Rolling: Max assist   Supine to sit: Total assist;+2 for physical assistance;+2 for safety/equipment Sit to supine: Total assist;+2 for physical assistance;+2 for safety/equipment   General bed mobility comments: pt max assist to roll to right with max cues and assist to prevent pushing or grasping in opposite direction of LUE. Total assist to control trunk and legs to EOB. Total assist to scoot to [place] transfer comment: unable, pt pushing with bil UE today, leaning back and not assisting with  attempt at standing today and unable to safely complete with pt returned to supine  Ambulation/Gait                 Stairs            Wheelchair Mobility    Modified Rankin (Stroke Patients Only)       Balance Overall balance assessment: Needs assistance   Sitting balance-Leahy Scale: Poor Sitting balance - Comments: Pt Sat EOB 10 min with initial mod assist with quick transition to min-minguard assist with placement of LUE on foot rail and pt able to maintain minguard grossly 15 sec at a time. Limited by impulsivity and pushing as pt required redirection to atttend to task                            Cognition Arousal/Alertness: Awake/alert Behavior During Therapy: Flat affect Overall Cognitive Status: Difficult to assess Area of Impairment: Attention;Following commands   Current Attention Level: Focused   Following Commands: Follows one step commands inconsistently     Problem Solving: Decreased initiation General Comments: unable to accurately assess cognition due to communication deficits and trach. Pt appears more internally distracted today and unable to follow commands consistently     Exercises      General Comments        Pertinent Vitals/Pain Pain Assessment: Faces Faces Pain Scale: Hurts even more Pain Location: pt grimacing throughout session but unable to rate or localize pain Pain Intervention(s): Limited activity within patient's tolerance;Repositioned  Home Living                      Prior Function            PT Goals (current goals can now be found in the care plan section) Progress towards PT goals: Progressing toward goals (limited by medical/respiratory status)    Frequency           PT Plan Current plan remains appropriate    Co-evaluation             End of Session   Activity Tolerance: Patient limited by fatigue Patient left: in bed;with call bell/phone within reach;with  nursing/sitter in room     Time: 0959-1029 PT Time Calculation (min) (ACUTE ONLY): 30 min  Charges:  $Therapeutic Activity: 8-22 mins $Neuromuscular Re-education: 8-22 mins                    G Codes:      Kaymen Adrian B Drue Camera 10-19-16, 11:38 AM Elwyn Reach, [place]

## 2016-10-18 ENCOUNTER — Inpatient Hospital Stay (HOSPITAL_COMMUNITY): Payer: Medicare Other

## 2016-10-18 DIAGNOSIS — I161 Hypertensive emergency: Secondary | ICD-10-CM

## 2016-10-18 DIAGNOSIS — Z7189 Other specified counseling: Secondary | ICD-10-CM

## 2016-10-18 DIAGNOSIS — J441 Chronic obstructive pulmonary disease with (acute) exacerbation: Secondary | ICD-10-CM

## 2016-10-18 DIAGNOSIS — I63411 Cerebral infarction due to embolism of right middle cerebral artery: Secondary | ICD-10-CM

## 2016-10-18 DIAGNOSIS — N170 Acute kidney failure with tubular necrosis: Secondary | ICD-10-CM

## 2016-10-18 DIAGNOSIS — I745 Embolism and thrombosis of iliac artery: Secondary | ICD-10-CM

## 2016-10-18 DIAGNOSIS — N183 Chronic kidney disease, stage 3 (moderate): Secondary | ICD-10-CM

## 2016-10-18 DIAGNOSIS — I272 Pulmonary hypertension, unspecified: Secondary | ICD-10-CM

## 2016-10-18 DIAGNOSIS — N182 Chronic kidney disease, stage 2 (mild): Secondary | ICD-10-CM

## 2016-10-18 DIAGNOSIS — J431 Panlobular emphysema: Secondary | ICD-10-CM

## 2016-10-18 LAB — COMPREHENSIVE METABOLIC PANEL
ALT: 30 U/L (ref 14–54)
ANION GAP: 8 (ref 5–15)
AST: 34 U/L (ref 15–41)
Albumin: 1.9 g/dL — ABNORMAL LOW (ref 3.5–5.0)
Alkaline Phosphatase: 111 U/L (ref 38–126)
BUN: 47 mg/dL — ABNORMAL HIGH (ref 6–20)
CHLORIDE: 106 mmol/L (ref 101–111)
CO2: 27 mmol/L (ref 22–32)
Calcium: 9.6 mg/dL (ref 8.9–10.3)
Creatinine, Ser: 1.31 mg/dL — ABNORMAL HIGH (ref 0.44–1.00)
GFR, EST AFRICAN AMERICAN: 45 mL/min — AB (ref >60)
GFR, EST NON AFRICAN AMERICAN: 38 mL/min — AB (ref >60)
Glucose, Bld: 144 mg/dL — ABNORMAL HIGH (ref 65–99)
Potassium: 4.9 mmol/L (ref 3.5–5.1)
SODIUM: 141 mmol/L (ref 135–145)
Total Bilirubin: 0.4 mg/dL (ref 0.3–1.2)
Total Protein: 5.9 g/dL — ABNORMAL LOW (ref 6.5–8.1)

## 2016-10-18 LAB — HEMOGLOBIN AND HEMATOCRIT, BLOOD
HCT: 26.9 % — ABNORMAL LOW (ref 36.0–46.0)
HEMATOCRIT: 23.1 % — AB (ref 36.0–46.0)
Hemoglobin: 7 g/dL — ABNORMAL LOW (ref 12.0–15.0)
Hemoglobin: 8.3 g/dL — ABNORMAL LOW (ref 12.0–15.0)

## 2016-10-18 LAB — GLUCOSE, CAPILLARY
GLUCOSE-CAPILLARY: 135 mg/dL — AB (ref 65–99)
GLUCOSE-CAPILLARY: 143 mg/dL — AB (ref 65–99)
GLUCOSE-CAPILLARY: 155 mg/dL — AB (ref 65–99)
GLUCOSE-CAPILLARY: 165 mg/dL — AB (ref 65–99)
GLUCOSE-CAPILLARY: 165 mg/dL — AB (ref 65–99)
GLUCOSE-CAPILLARY: 207 mg/dL — AB (ref 65–99)
Glucose-Capillary: 178 mg/dL — ABNORMAL HIGH (ref 65–99)

## 2016-10-18 LAB — PREPARE RBC (CROSSMATCH)

## 2016-10-18 LAB — CBC
HCT: 22.1 % — ABNORMAL LOW (ref 36.0–46.0)
Hemoglobin: 6.6 g/dL — CL (ref 12.0–15.0)
MCH: 29.6 pg (ref 26.0–34.0)
MCHC: 29.9 g/dL — AB (ref 30.0–36.0)
MCV: 99.1 fL (ref 78.0–100.0)
PLATELETS: 276 10*3/uL (ref 150–400)
RBC: 2.23 MIL/uL — ABNORMAL LOW (ref 3.87–5.11)
RDW: 15.6 % — AB (ref 11.5–15.5)
WBC: 7.1 10*3/uL (ref 4.0–10.5)

## 2016-10-18 LAB — OCCULT BLOOD X 1 CARD TO LAB, STOOL: FECAL OCCULT BLD: NEGATIVE

## 2016-10-18 MED ORDER — SODIUM CHLORIDE 0.9 % IV SOLN: Freq: Once | INTRAVENOUS | Status: DC

## 2016-10-18 MED ORDER — ETOMIDATE 2 MG/ML IV SOLN
INTRAVENOUS | Status: AC
  Filled 2016-10-18: qty 10

## 2016-10-18 NOTE — Progress Notes (Signed)
PULMONARY / CRITICAL CARE MEDICINE   Name: [name] MRN: [id number] DOB: 05/11/1941    ADMISSION DATE:  09/30/2016  REFERRING MD:  Dr. Cletus Gash, ER  CHIEF COMPLAINT:  Rt sided weakness.  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   CULTURES: Blood 11/03 >>neg Urine 11/03 >> neg  ANTIBIOTICS: Vancomycin 11/03 >> 11/03 Zosyn 11/03 >> 11/03   SIGNIFICANT EVENTS: 11/03 Admit, neuro consulted 11/04 Start 3% NS 11/05 Extubated >> reintubated 11/10 tracheostomy placement  LINES/TUBES: ETT 11/03 >> 11/05 ETT 11/05 >> 11/10 Lt PICC 11/05 >>   SUBJECTIVE:   Tolerated ATC this am.  When I saw her at 1pm > she was in resp distress, tachypneic, sats in mid 80s, and had wheezing in BULF.  VITAL SIGNS: BP (!) 131/57   Pulse 64   Temp 99.3 F (37.4 C) (Oral)   Resp 20   Ht 5\' 7"  (1.702 m)   Wt 57.1 kg (125 lb 14.1 oz)   SpO2 92%   BMI 19.72 kg/m   VENTILATOR SETTINGS: Vent Mode: PRVC FiO2 (%):  [30 %-40 %] 40 % Set Rate:  [16 bmp] 16 bmp Vt Set:  [500 mL-580 mL] 580 mL PEEP:  [5 cmH20] 5 cmH20 Plateau Pressure:  [17 cmH20-23 cmH20] 23  cmH20  INTAKE / OUTPUT:  Intake/Output Summary (Last 24 hours) at 10/18/16 1422 Last data filed at 10/18/16 1400  Gross per 24 hour  Intake             2025 ml  Output              300 ml  Net             1725 ml    PHYSICAL EXAMINATION: General: tachypneic, in resp distress. On ATC. Uses accesory muscles.  Neuro: awake, opens eyes, intermittently follows command, appears to be expressive and receptive aphasia,  HEENT: tracheostomy in place, right sided facial droop  Cardiovascular:  RRR, no mgr Lungs:  On trach collar, scattered rhonchi, (+) accesory muscle use. (+) wheezing in BULF.  Abdomen:  Soft, BS+, soft, non tender Skin:  Chronic venous stasis changes  LABS:  BMET  Recent Labs Lab 10/16/16 0310 10/17/16 0445 10/18/16 0315  NA 138 140 141  K 4.4 4.5 4.9  CL 103 105 106  CO2 27 27 27   BUN 46* 45* 47*  CREATININE 1.36* 1.33* 1.31*  GLUCOSE 174* 108* 144*    Electrolytes  Recent Labs Lab 10/13/16 0345 10/14/16 0343 10/15/16 0353 10/16/16 0310 10/17/16 0445 10/18/16 0315  CALCIUM 9.4 9.2 9.2 9.3 9.3 9.6  MG 2.2 2.1 2.6* 2.3  --   --   PHOS 3.8  --   --   --   --   --  CBC  Recent Labs Lab 10/15/16 0353 10/16/16 0310 10/18/16 0315 10/18/16 0834  WBC 8.2 10.4 7.1  --   HGB 7.1* 7.8* 6.6* 7.0*  HCT 22.6* 25.1* 22.1* 23.1*  PLT 153 256 276  --     Coag's  Recent Labs Lab 10/13/16 0345  INR 0.94    Sepsis Markers No results for input(s): LATICACIDVEN, PROCALCITON, O2SATVEN in the last 168 hours.  ABG No results for input(s): PHART, PCO2ART, PO2ART in the last 168 hours.  Liver Enzymes  Recent Labs Lab 10/18/16 0315  AST 34  ALT 30  ALKPHOS 111  BILITOT 0.4  ALBUMIN 1.9*    Cardiac Enzymes  Recent Labs Lab 10/13/16 0905  TROPONINI 0.04*    Glucose  Recent Labs Lab 10/17/16 1558 10/17/16 2024 10/18/16 0021 10/18/16 0313 10/18/16 0837 10/18/16 1223  GLUCAP 207* 170* 143* 135* 207* 165*    ASSESSMENT /  PLAN: Acute Hypoxemic respiratory Failure. S/P Tracheostomy.  AECOPD Concern for pulm edema. Less likely HCAP.  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 Dysphagia secondary to CVA AoCKD 2 Pubic rami fracture DM type II Anemia Anxiety  Discussion This is a 75 year old female s/p Left MCA stroke w/ hemorrhagic conversion. She is now s/p trach d/t inability to protect airway. We are working on weaning from vent. Additional significant barriers currently include: both expressive and receptive aphasia, probable dysphagia, inability to protect airway and deconditioning. PST has been slowed down by pt becoming "tachypneic" while on ATC.    Plan: Pt did PST this am and was seen at 1pm to be in resp distress with accesory muscle use. Will place back on the vent.  Plan for CXR >> if with significant effusion, will need diuresis. If CXR is not remarkable, will start IV steroids for AECOPD.  Cont vent support. ATC as tolerated. Anticipate may need LTAC.  Agree with addressing anxiety as it definitely contributes to tachypnea while on PST.  Cont pulmicort BID and atrovent QID.  Cont TF. Keep o2 sats > 88%.  Off abx.  No family at bedside.   Monica Becton, MD 10/18/2016, 2:22 PM [facility] Pager [phone] After 3 pm or if no answer, call [phone]

## 2016-10-18 NOTE — Care Management Note (Signed)
Case Management Note Original note created by Ellan Lambert 10/06/16  Patient Details  Name: [name] MRN: [id number] Date of Birth: November 28, 1941  Subjective/Objective:  Pt admitted on 09/30/16 s/p Lt MCA infarct.  PTA, pt independent, lives with family members.                    Action/Plan: Pt remains intubated currently.  Planning possible tracheostomy on Friday, 11/10.  Will follow progress.    Expected Discharge Date:                  Expected Discharge Plan:  Long Term Acute Care (LTAC)  In-House Referral:     Discharge planning Services  CM Consult  Post Acute Care Choice:    Choice offered to:     DME Arranged:    DME Agency:     HH Arranged:    HH Agency:     Status of Service:  In process, will continue to follow  If discussed at Long Length of Stay Meetings, dates discussed:    Additional Comments: 10/18/2016  Palliative Care met with pts sister - goals of care not yet determined.  Pt remains on the ventilator and has been unable to tolerate TC.  CM will continue to follow for discharge needs.  10/17/16 Critical Care doctor requested to perform Peer to Peer prior to 3pm - pt is currently under [facility] service  9:50 am; CM text paged Dr Thereasa Solo (attending) and provided appeal information  - requested peer to peer be called in prior to 3pm today and followup with CM  10/14/16 LTACH denied by insurance.  CM informed attending Rama and provided peer to peer number [phone] - has to be done prior to 11/20 at 3pm.  CM paged attending Dr Rama 2 additional times regarding peer to peer request - CM did not receive a response  10/12/16 Attending discussed and answered questions of sister and [facility] officially [facility].  [facility] has began [employer]er.  Discussed in LOS 10/13/16; remains appropriate for continued stay  CM again requested attending to consult with pt's sister so that determination of LTACH could be made.  Attempt at contact was made by  attending however sister was unreachable via phone - will continue to try.  Pt is scheduled for PEG tube placement tomorrow  10/11/16 [facility] liason met with sister at bedside.  Sister requested attending conference prior to choosing which facility - attending paged  Discussed in LOS 11/14:  Pt remains appropriate for continued stay and deemed appropriate for [facility] referral.  CM provided referral to both [facility].  CM spoke with both brother Collins [place] and sister Helene Kelp and choice for LTACH was given.  Sister immediately chose [facility] due to location - sister is in agreement to meet with [facility] today at 1pm.  CM will inform [facility] of selection.  Pt stays with sister and per sister; pt was completely independent prior to admit   10/10/16 Pt transferred to 18M over weekend.  Pt trached 10/07/16 - remains on ventilator 30%, sedation discontinued earlier this am, tube feeds on hold awaiting access - may need PEG at some point.  Creatinine rising.  Attending requested LTACH referral - CM called referral in to physician advisor to determine appropriateness.  PT recommending CIR; however pt is not currently participating in therapies

## 2016-10-18 NOTE — Progress Notes (Signed)
eLink Physician-Brief Progress Note Patient Name: GENIKA TSUTSUMI DOB: Mar 31, 1941 MRN: [id number]   Date of Service  10/18/2016  HPI/Events of Note  Trach replaced with Xtra long tube  eICU Interventions  CXR reviewed No pTX , tube in place     Intervention Category Evaluation Type: Other  Flora Lipps 10/18/2016, 7:31 PM

## 2016-10-18 NOTE — Procedures (Signed)
First trach change  Super O2 given Placed small tube changer Removed old trach The wound has discharge and impression of flange wioth ulcer on posterior aspect okaced new extra long trach prox Easily Cap pos Equal BS sats good  Needs wound care pcxrwill d/w Dr Corrie Dandy consider ABX Culture to send  [name]. Titus Mould, MD, [place] Pgr: [facility]

## 2016-10-18 NOTE — Progress Notes (Signed)
Nutrition Follow-up  DOCUMENTATION CODES:   Not applicable  INTERVENTION:    Continue Glucerna 1.2 via PEG at to 55 ml/h (1320 ml) with Prostat 30 ml once daily to provide 1684 kcal, 94 gm protein, 1063 ml free water daily  NUTRITION DIAGNOSIS:   Inadequate oral intake related to inability to eat as evidenced by NPO status.  Ongoing  GOAL:   Patient will meet greater than or equal to 90% of their needs  Met  MONITOR:   TF tolerance, Vent status, Labs  ASSESSMENT:   75 y/o female w/ extensive PMHx including Anemia, Anxiety, CAD, COPD, CAD, HTN, DM2, MI, GERD, Hiatal Hernia, Cirrhosis. Noted by family to be making gurgling noises and not moving right side. CT showed acute L MCA infarct and fx of Rt superior and inferior pubic rami.   Discussed patient in ICU rounds and with RN today. Patient is awaiting transfer to stepdown vent bed. Currently on trach collar. Tolerating TF well. Patient is currently receiving Glucerna 1.2 via PEG at 55 ml/h with Prostat 30 ml once daily to provide 1684 kcals, 94 gm protein, 1063 ml free water daily.  Labs reviewed. CBG's: 207-165 Medications reviewed and include colace and prednisone.  Diet Order:  Diet NPO time specified  Skin:  Wound (see comment) (stage II pressure injury to chest)  Last BM:  11/21  Height:   Ht Readings from Last 1 Encounters:  09/30/16 5' 7"  (1.702 m)    Weight:   Wt Readings from Last 1 Encounters:  10/18/16 125 lb 14.1 oz (57.1 kg)    Ideal Body Weight:  61.36 kg  BMI:  Body mass index is 19.72 kg/m.  Estimated Nutritional Needs:   Kcal:  6834  Protein:  90-100 gm  Fluid:  >/= 1.9 L  EDUCATION NEEDS:   No education needs identified at this time  Molli Barrows, [place], [place], [place] Pager [phone] After Hours Pager [phone]

## 2016-10-18 NOTE — Progress Notes (Signed)
Occupational Therapy Treatment Patient Details Name: [name] MRN: [id number] DOB: 12/12/1940 Today's Date: 10/18/2016    History of present illness Patient is a 75 y/o female with hx of DM, PVD, PAD, MI, HTN, gout, COPD, CAD, lower limb ischemia presents with right sided weakness. 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 Found to have Nondisplaced fracture of the right superior and inferior pubic rami. 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    OT comments  Pt with very limited participation today.  She opens eyes briefly, does not follow commands.  She required total A +2 for all aspects of bed mobility and total A to sit EOB x 5 mins with RR increasing to 39 BP 160/81.   Will continue to follow.   Follow Up Recommendations  LTACH    Equipment Recommendations  3 in 1 bedside comode;Wheelchair (measurements OT);Wheelchair cushion (measurements OT);Hospital bed    Recommendations for Other Services      Precautions / Restrictions Precautions Precautions: Fall Precaution Comments: vent, trach, peg       Mobility Bed Mobility Overal bed mobility: Needs Assistance Bed Mobility: Rolling;Supine to Sit;Sit to Supine Rolling: Total assist   Supine to sit: Total assist;+2 for physical assistance Sit to supine: Total assist;+2 for physical assistance   General bed mobility comments: Pt making no attempts to assist.  Pushing heavily to Lt when returned to supine  Transfers                 General transfer comment: unable to attempt     Balance Overall balance assessment: Needs assistance Sitting-balance support: Single extremity supported;Feet supported Sitting balance-Leahy Scale: Zero Sitting balance - Comments: Pt sat EOB x 5 mins with total A.  She was initially pushing heavily to the Rt in sitting.  RR  increased to 39 (pt on 40% FIO2) and returned to supine        Standing balance comment: did not attempt                    ADL   Eating/Feeding: NPO   Grooming: Wash/dry hands;Wash/dry face;Sitting;Total assistance                               Functional mobility during ADLs: Total assistance        Vision                     Perception     Praxis      Cognition   Behavior During Therapy: Flat affect Overall Cognitive Status: Impaired/Different from baseline Area of Impairment: Attention   Current Attention Level: Focused            General Comments: Pt opens eyes only minimally.  She followed no commands today     Extremity/Trunk Assessment               Exercises     Shoulder Instructions       General Comments      Pertinent Vitals/ Pain       Pain Assessment: Faces Faces Pain Scale: Hurts even more Pain Location: Unable to determine.  Pt grimacing  Pain Descriptors / Indicators: Grimacing Pain Intervention(s): Limited activity within patient's tolerance;Repositioned  Home Living  Prior Functioning/Environment              Frequency  Min 2X/week        Progress Toward Goals  OT Goals(current goals can now be found in the care plan section)  Progress towards OT goals: Not progressing toward goals - comment     Plan Discharge plan remains appropriate    Co-evaluation                 End of Session Equipment Utilized During Treatment: Oxygen   Activity Tolerance Patient limited by lethargy;Patient limited by pain   Patient Left in bed;with call bell/phone within reach;with bed alarm set   Nurse Communication Mobility status        Time: [id number] OT Time Calculation (min): 31 min  Charges: OT General Charges $OT Visit: 1 Procedure OT Treatments $Therapeutic Activity: 23-37 mins  Melannie Metzner M 10/18/2016, 12:19  PM

## 2016-10-18 NOTE — Progress Notes (Signed)
PROGRESS NOTE    [name]  [id number] DOB: 1941-02-12 DOA: 09/30/2016 PCP: Leola Brazil, MD   Brief Narrative:  75 yo BF PMHx  Anxiety, CAD native artery S/P multiple stent LAD , Dysrhythmia, Heart murmur, HTN, MI, PVD,Pericardial effusion, COPD,Chronic bronchitis , Anemia of chronic illness Cirrhosis of liver without mention of alcohol,,Chronic back pain,Type II diabetes mellitus uncontrolled with complication,   Noted to be making gurgling noises by family and was not moving right side.  Last seen at baseline prior to going to bed on 11/02.  She had blood pressure of 224/113.  She was given labetalol.  There was concern for airway protection and she required intubation.  She had elevated lactic acid and given Abx.  She was also given solumedrol and neb tx.  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    Subjective: 11/21 patient's eyes open patient alert nods yes and no to questions. Follows commands. Moving all extremities to commands.     Assessment & Plan:   Principal Problem:   Arterial ischemic stroke, MCA (middle cerebral artery), left, acute (HCC) Active Problems:   CVA (cerebral vascular accident) ([place])   Pneumonia of left lower lobe due to infectious organism [facility])   Acute respiratory failure with hypoxemia ([employer])   Cerebrovascular accident (CVA) due to embolism of left middle cerebral artery ([place])   Hemorrhagic stroke ([place])   Chronic combined systolic and diastolic heart failure (HCC)   Ventilator dependence ([place])   Tracheostomy status ([place])   Closed fracture of multiple pubic rami, right, sequela   Diverticulosis of intestine without bleeding  Diabetes (HCC)   Chronic obstructive pulmonary disease (HCC)   Coronary artery disease involving native coronary artery of native heart without angina pectoris   Benign essential HTN   Mixed hyperlipidemia   Tachypnea   Hypernatremia   Hypokalemia   Hypoalbuminemia due to protein-calorie malnutrition (HCC)   Acute on chronic renal insufficiency   Lymphocytosis   Acute blood loss anemia   Macrocytic anemia   Pressure injury of skin   Acute on chronic systolic CHF (congestive heart failure) ([place])   Sinus arrhythmia   Uncontrolled type 2 diabetes mellitus with complication (HCC)   Tracheostomy care ([place])   Cerebral infarction due to embolism of right middle cerebral artery ([place])   Hypertensive emergency   COPD exacerbation ([place])   Acute renal failure with acute tubular necrosis superimposed on stage 3 chronic kidney disease ([place])   Acute Lt MCA infarct with hemorrhagic conversion - neurology signed off 11/08 - Unfortunately over the weekend Peer to Peer on-call requested by medical insurance requested, which did not occur  And per  [facility] [employer] states patient no longer eligible for LTAC -Patient currently alert and following commands  Acute on Chronic systolic CHF -Strict in and out since admission + 10.5 L -Daily weight Filed Weights   10/15/16 0500 10/16/16 0435 10/18/16 0427  Weight: 82.7 kg (182 lb 5.1 oz) 56.5 kg (124 lb 9 oz) 57.1 kg (125 lb 14.1 oz)  -Coreg 12.5 bid -Hydralazine 100 mg TID -Transfuse for hemoglobin<8 -11/21 Transfuse one unit PRBC  Pulmonary hypertension -See CHF  HTN emergency -See CHF  Sinus arrhythmia/PVC -Most likely demand ischemia. Resolved -Most likely  secondary to patient initially being on trach collar.  Occlusion of Lt external iliac artery.  -Unfortunately would not be able to anticoagulate secondary to hemorrhagic conversion of CVA  Compromised airway in setting of CVA. -Continue to wean per PCCM  protocol  COPD/asthma -Failed extubation attempt 11/05, continue tracheostomy management - Continue trach collar attempts to tid  - OOB - tracheostomy care - scheduled pulmicort, duoneb  Dysphagia secondary to CVA -11/16 S/P PEG tube placement. Tube feeds 100m/hr + 55 ml/hr  Hypernatremia -Resolved  Acute on chronic renal failure(Baseline Cr 1.0-1.2) Lab Results  Component Value Date   CREATININE 1.31 (H) 10/18/2016   CREATININE 1.33 (H) 10/17/2016   CREATININE 1.36 (H) 10/16/2016  - continue D5 W to 573mhr  Pubic rami fracture -Per orthopedics can weight bear as tolerated with walker  DM type II uncontrolled with complication -1106/2emoglobin A1c =8.1  -Lantus 25 units daily -NovoLog 4 units q 4hr -Resistant SSI  Anemia Recent Labs Lab 10/12/16 0317 10/14/16 0343 10/15/16 0353 10/16/16 0310 10/18/16 0315  HGB 7.5* 8.1* 7.1* 7.8* 6.6*  -Occult blood pending -See CHF      DVT prophylaxis:  SQ heparin, SCDs Code Status: Full Family Communication: None Disposition Plan: CIR vs SNF   Consultants:  PCCM    Procedures/Significant Events:  CT head 11/03 >> Acute Lt MCA infarct with hemorrhagic conversion 11/03 Admit, neuro consulted CT angio chest/abd/pelvis 11/03 >> atelectasis, diverticulosis, non displaced fx of Rt superior and inferior pubic rami, complete occlusion of Lt external iliac artery, aneurysmal dilation of Lt common iliac, atherosclerosis 11/04 Start 3% NS 11/05 Extubated >> reintubated 11/6 Echocardiogram: LVEF=: 40% to 45%. Akinesis mid-apicalanteroseptal, anterior, anterolateral, lateral,  inferolateral, inferior, inferoseptal, and apical myocardium. -  (grade 1 diastolic dysfunction). - Pulmonary arteries: PA peak pressure: 51 mm Hg (S). - Pericardium, extracardiac: small pericardial effusion,no evidence hemodynamic compromise. MRI brain 11/07 >> acute infarct Lt MCA with hemorrhagic conversion, cytotoxic edema 11/10 tracheostomy  placement 11/16 S/P PEG tube placement 11/21 Transfuse one unit PRBC   VENTILATOR SETTINGS:    Cultures Blood 11/03 >>neg Urine 11/03 >> neg   Antimicrobials: Vancomycin 11/03 >> 11/03 Zosyn 11/03 >> 11/03    Devices    LINES / TUBES:  ETT 11/03 >> 11/05 ETT 11/05 >> 11/10 Lt PICC 11/05 >> 11/10 tracheostomy>>   Continuous Infusions: . sodium chloride 10 mL/hr at 10/18/16 0500  . feeding supplement (GLUCERNA 1.2 CAL) 1,000 mL (10/18/16 0500)     Objective: Vitals:   10/18/16 0600 10/18/16 0700 10/18/16 0732 10/18/16 0800  BP: (!) 120/53 (!) 124/98 (!) 124/98 (!) 139/56  Pulse: (!) 56 61 63 (!) 59  Resp: 17 (!) 27 (!) 23 17  Temp:      TempSrc:      SpO2: 100% 100% 100% 100%  Weight:      Height:        Intake/Output Summary (Last 24 hours) at 10/18/16 0815 Last data filed at 10/18/16 0800  Gross per 24 hour  Intake          2000.25 ml  Output              850 ml  Net          1150.25 ml   Filed Weights   10/15/16 0500 10/16/16 0435 10/18/16 0427  Weight: 82.7 kg (182 lb 5.1 oz) 56.5 kg (124 lb 9 oz) 57.1 kg (125 lb 14.1 oz)    Examination:  General: patient's eyes open  patient alert nods yes and no to questions. Follows commands., Positive acute on chronic respiratory distress(Resolving) Eyes: negative scleral hemorrhage, negative anisocoria, negative icterus ENT: Negative Runny nose, negative gingival bleeding, Neck:  Negative scars, masses, torticollis, lymphadenopathy, JVD,  tracheostomy in place, negative sign of infection. Lungs: Clear to auscultation bilaterally without wheezes or crackles Cardiovascular: Regular rhythm and rate, without murmur gallop or rub normal S1 and S2 Abdomen: negative abdominal pain, nondistended, positive soft, bowel sounds, no rebound, no ascites, no appreciable mass Extremities: No significant cyanosis, clubbing, or edema bilateral lower extremities. Positive amputation left metatarsals Skin: Negative rashes,  lesions, ulcers Psychiatric:  Unable to fully assess  Central nervous system:  Patient moves extremities to command, nods yes and no to questions.   .     Data Reviewed: Care during the described time interval was provided by me .  I have reviewed this patient's available data, including medical history, events of note, physical examination, and all test results as part of my evaluation. I have personally reviewed and interpreted all radiology studies.  CBC:  Recent Labs Lab 10/12/16 0317 10/14/16 0343 10/15/16 0353 10/16/16 0310 10/18/16 0315  WBC 9.1 12.6* 8.2 10.4 7.1  HGB 7.5* 8.1* 7.1* 7.8* 6.6*  HCT 25.2* 26.7* 22.6* 25.1* 22.1*  MCV 101.2* 98.2 86.9 98.0 99.1  PLT 182 264 153 256 425   Basic Metabolic Panel:  Recent Labs Lab 10/13/16 0345 10/14/16 0343 10/15/16 0353 10/16/16 0310 10/17/16 0445 10/18/16 0315  NA 144 139 137 138 140 141  K 3.5 3.4* 4.7 4.4 4.5 4.9  CL 107 100* 103 103 105 106  CO2 31 29 26 27 27 27   GLUCOSE 146* 120* 156* 174* 108* 144*  BUN 45* 42* 18 46* 45* 47*  CREATININE 1.25* 1.36* 2.24* 1.36* 1.33* 1.31*  CALCIUM 9.4 9.2 9.2 9.3 9.3 9.6  MG 2.2 2.1 2.6* 2.3  --   --   PHOS 3.8  --   --   --   --   --    GFR: Estimated Creatinine Clearance: 32.9 mL/min (by C-G formula based on SCr of 1.31 mg/dL (H)). Liver Function Tests:  Recent Labs Lab 10/18/16 0315  AST 34  ALT 30  ALKPHOS 111  BILITOT 0.4  PROT 5.9*  ALBUMIN 1.9*   No results for input(s): LIPASE, AMYLASE in the last 168 hours. No results for input(s): AMMONIA in the last 168 hours. Coagulation Profile:  Recent Labs Lab 10/13/16 0345  INR 0.94   Cardiac Enzymes:  Recent Labs Lab 10/13/16 0905  TROPONINI 0.04*   BNP (last 3 results) No results for input(s): PROBNP in the last 8760 hours. HbA1C: No results for input(s): HGBA1C in the last 72 hours. CBG:  Recent Labs Lab 10/17/16 1214 10/17/16 1558 10/17/16 2024 10/18/16 0021 10/18/16 0313  GLUCAP  159* 207* 170* 143* 135*   Lipid Profile: No results for input(s): CHOL, HDL, LDLCALC, TRIG, CHOLHDL, LDLDIRECT in the last 72 hours. Thyroid Function Tests: No results for input(s): TSH, T4TOTAL, FREET4, T3FREE, THYROIDAB in the last 72 hours. Anemia Panel: No results for input(s): VITAMINB12, FOLATE, FERRITIN, TIBC, IRON, RETICCTPCT in the last 72 hours. Urine analysis:    Component Value Date/Time   COLORURINE AMBER (A) 10/08/2016 1716   APPEARANCEUR TURBID (A) 10/08/2016 1716   LABSPEC 1.012 10/08/2016 1716   PHURINE 5.5 10/08/2016 1716   GLUCOSEU NEGATIVE 10/08/2016 1716   HGBUR MODERATE (A) 10/08/2016 1716   BILIRUBINUR NEGATIVE 10/08/2016 1716   KETONESUR NEGATIVE  10/08/2016 1716   PROTEINUR 100 (A) 10/08/2016 1716   UROBILINOGEN 0.2 04/15/2014 1208   NITRITE NEGATIVE 10/08/2016 1716   LEUKOCYTESUR LARGE (A) 10/08/2016 1716   Sepsis Labs: @LABRCNTIP (procalcitonin:4,lacticidven:4)  )No results found for this or any previous visit (from the past 240 hour(s)).       Radiology Studies: No results found.      Scheduled Meds: . sodium chloride   Intravenous Once  . ALPRAZolam  0.25 mg Per Tube BID  . artificial tears  1 application Both Eyes BID  . budesonide (PULMICORT) nebulizer solution  0.5 mg Nebulization BID  . carvedilol  12.5 mg Per Tube BID WC  . chlorhexidine gluconate (MEDLINE KIT)  15 mL Mouth Rinse BID  . docusate  100 mg Per Tube BID  . dorzolamide  1 drop Right Eye BID  . ezetimibe  10 mg Per Tube Daily  . famotidine  20 mg Per Tube Daily  . feeding supplement (PRO-STAT SUGAR FREE 64)  30 mL Per Tube Daily  . heparin subcutaneous  5,000 Units Subcutaneous Q8H  . hydrALAZINE  100 mg Per Tube Q8H  . insulin aspart  0-20 Units Subcutaneous Q4H  . insulin aspart  4 Units Subcutaneous Q4H  . insulin glargine  25 Units Subcutaneous Daily  . mouth rinse  15 mL Mouth Rinse QID  . predniSONE  5 mg Per Tube Daily   Continuous Infusions: . sodium  chloride 10 mL/hr at 10/18/16 0500  . feeding supplement (GLUCERNA 1.2 CAL) 1,000 mL (10/18/16 0500)     LOS: 18 days    Time spent: 40 minutes    WOODS, Geraldo Docker, MD [facility] Pager [phone]   If 7PM-7AM, please contact night-coverage [email or web address] Password [email or web address] 10/18/2016, 8:15 AM

## 2016-10-18 NOTE — Consult Note (Signed)
Consultation Note Date: 10/18/2016   Patient Name: [name]  DOB: 1941/06/28  MRN: [id number]  Age / Sex: 75 y.o., female  PCP: Vincente Liberty, MD Referring Physician: Allie Bossier, MD  Reason for Consultation: Establishing goals of care  HPI/Patient Profile: 75 y.o. female  with past medical history of COPD, CAD, DJD (knee), DMII, MI, PVD  admitted on 09/30/2016 with altered mental status. 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 She has required intubation twice, now with tracheostomy and poor weaning. She has a PEG.  She has not tolerated attempts at PT/OT. Inpatient rehab is unlikely. LTACH has been recommended. Palliative medicine consulted for [place].   Clinical Assessment and Goals of Care: Met with patient's sister Helene Kelp. Patient lived with Helene Kelp prior to admission and they are very close. Helene Kelp reports there are other siblings who she is in communication with regarding her sister's care. Discussed patient's overall status and trajectory, including code status and possibilities for rehabilitation. Helene Kelp was surprised to hear that it is unlikely that patient will regain meaningful neurologic function. Helene Kelp stated she expected patient to "wake up" and to return to some meaningful functional status.  Helene Kelp believed that patient was going to rehab. Discussed with Helene Kelp that patient is likely not a candidate for CIR. 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 However, Helene Kelp was very distressed by news of patient's prognosis and not yet ready to make any decisions regarding her sister's care or code status. She would like to process the information she received today and have follow up meeting with palliative medicine on Friday with her siblings present.    Primary Decision Maker NEXT OF KIN - sister- Helene Kelp (with consensus among living siblings)    SUMMARY OF RECOMMENDATIONS -Continue full scope of care for now -Follow up meeting planned with patient's siblings on Friday- 10/21/16    Code Status/Advance Care Planning:  Full code   Palliative Prophylaxis:   Delirium Protocol, Frequent Pain Assessment and Turn Reposition  Additional Recommendations (Limitations, Scope, Preferences):  Full Scope Treatment  Psycho-social/Spiritual:   Desire for further Chaplaincy support:Yes  Prognosis:    Unable to determine  Discharge Planning: To Be Determined  Primary Diagnoses: Present on Admission: . CVA (cerebral vascular accident) ([place]) . Arterial ischemic stroke, MCA (middle cerebral artery), left, acute ([place])   I have reviewed the medical record, interviewed the patient and family, and examined the patient. The following aspects are pertinent.  Past Medical History:  Diagnosis Date  . Anemia of chronic illness 11/06/2015  . Ankylosing spondylitis ([place])   . Anxiety   . Arthritis    "psorasic"  . Asthma   . Barrett esophagus   . Blood transfusion ~ 1962   "11 units of blood"  . Candida esophagitis ([place])   . Carotid artery disease ([facility])    dopplers showed mod L ICA stenosis   . 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 Chronic back pain   . Chronic bronchitis ([place])    "since childhood" (12/06/2012)  . Chronic sinus infection   . Cirrhosis of liver without mention of alcohol   . COPD (chronic obstructive pulmonary disease) ([place])   . COPD (chronic obstructive pulmonary disease) ([place])   . Coronary artery disease   . Critical lower limb ischemia, with ulcer Lt foot 12/07/2012  . Dermatophytosis of nail [id number]  . Diverticulosis   . DJD (degenerative joint disease) of knee    "both" (12/06/2012)  . Dysrhythmia    "palpitations"  . Esophageal dilatation   . Exertional dyspnea   . External bleeding hemorrhoids   . GERD  (gastroesophageal reflux disease)   . GI bleed   . Gout   . Heart murmur   . Hiatal hernia   . History of nuclear stress test 11/2012   lexiscan; normal, low risk   . Hypercholesteremia   . Hypertension   . Iron deficiency anemia   . Myocardial infarction   . Non-healing ulcer of foot, secondary to diabetes and PVD 12/07/2012  . OA (osteoarthritis)   . PAD (peripheral artery disease) ([place])   . Pericardial effusion    moderate by 2-D echo without evidence of An odd  . Peripheral vascular disease ([place])   . Pneumonia 10/07/11; 2006; 1980's  . Psoriasis   . Psoriasis   . Psoriatic arthritis ([place])   . PVD (peripheral vascular disease) ([place]) 12/07/2012  . Sinus headache   . Tobacco abuse 12/07/2012  . Type II diabetes mellitus ([place])    Social History   Social History  . Marital status: Widowed    Spouse name: N/A  . Number of children: 0  . Years of education: N/A   Occupational History  . 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 Smoking status: Former Smoker    Packs/day: 1.00    Years: 40.00    Types: Cigarettes  . Smokeless tobacco: Former Systems developer    Quit date: 09/28/2013     Comment: pt states that she smokes "every once in a while"  . Alcohol use No     Comment: "quit social drinking years ago"  . Drug use: No  . Sexual activity: No   Other Topics Concern  . None   Social History Narrative  . None   Family History  Problem Relation Age of Onset  . Cancer Mother   . Lupus Sister     x2  . Lung cancer Sister   . Thyroid disease Sister   . Heart Problems Maternal Grandmother   . Cancer Paternal Grandfather   . Diabetes Brother     x2  . Arthritis Brother     x3  . Arthritis Sister     x3  . Colon cancer Neg Hx    Scheduled Meds: . sodium chloride   Intravenous Once  . ALPRAZolam  0.25 mg Per Tube BID  . artificial tears  1 application Both Eyes BID  . budesonide (PULMICORT) nebulizer solution  0.5 mg Nebulization BID    . carvedilol  12.5 mg Per Tube BID WC  . chlorhexidine gluconate (MEDLINE KIT)  15 mL Mouth Rinse BID  . docusate  100 mg Per Tube BID  . dorzolamide  1 drop Right Eye BID  . ezetimibe  10 mg Per Tube Daily  . famotidine  20 mg Per Tube Daily  . feeding supplement (PRO-STAT  SUGAR FREE 64)  30 mL Per Tube Daily  . heparin subcutaneous  5,000 Units Subcutaneous Q8H  . hydrALAZINE  100 mg Per Tube Q8H  . insulin aspart  0-20 Units Subcutaneous Q4H  . insulin aspart  4 Units Subcutaneous Q4H  . insulin glargine  25 Units Subcutaneous Daily  . mouth rinse  15 mL Mouth Rinse QID  . predniSONE  5 mg Per Tube Daily   Continuous Infusions: . sodium chloride 10 mL/hr at 10/18/16 0500  . feeding supplement (GLUCERNA 1.2 CAL) 1,000 mL (10/18/16 0500)   PRN Meds:.acetaminophen (TYLENOL) oral liquid 160 mg/5 mL, ALPRAZolam, bisacodyl, fentaNYL (SUBLIMAZE) injection, hydrALAZINE, ipratropium-albuterol, labetalol, polyethylene glycol, sodium chloride flush, traMADol Medications Prior to Admission:  Prior to Admission medications   Medication Sig Start Date End Date Taking? Authorizing Provider  calcipotriene (DOVONOX) 0.005 % cream Apply 1 application topically 2 (two) times daily. 09/20/16  Yes Historical Provider, MD  carvedilol (COREG) 3.125 MG tablet Take 1 tablet by mouth 2  times daily with  meals Patient taking differently: Take 1 tablet (3.154m) by mouth 2  times daily with  meals 07/15/16  Yes JLorretta Harp MD  dorzolamide (TRUSOPT) 2 % ophthalmic solution Place 1 drop into the right eye 2 (two) times daily.   Yes Historical Provider, MD  esomeprazole (NEXIUM) 40 MG capsule Take 40 mg by mouth daily before breakfast.   Yes Historical Provider, MD  furosemide (LASIX) 40 MG tablet Take 1 tablet (40 mg total) by mouth daily. 07/07/14  Yes JLorretta Harp MD  glimepiride (AMARYL) 2 MG tablet Take 2 mg by mouth daily with breakfast.  10/21/14  Yes Historical Provider, MD  metFORMIN  (GLUCOPHAGE) 500 MG tablet Take 500 mg by mouth daily after supper. Pt. Told that she is  only to take only as needed , for a blood sugar of 250 or greater   Yes Historical Provider, MD  nystatin (MYCOSTATIN) 100000 UNIT/ML suspension Use as directed 5 mLs in the mouth or throat every 8 (eight) hours. 09/19/16  Yes Historical Provider, MD  pravastatin (PRAVACHOL) 10 MG tablet Take 10 mg by mouth daily. Monday  and Thursday    Yes Historical Provider, MD  predniSONE (DELTASONE) 5 MG tablet Take 5 mg by mouth daily.    Yes Historical Provider, MD  ranitidine (ZANTAC) 150 MG tablet Take 150 mg by mouth daily. 09/20/16  Yes Historical Provider, MD  Travoprost, BAK Free, (TRAVATAMN) 0.004 % SOLN ophthalmic solution Place 1 drop into both eyes at bedtime.    Yes Historical Provider, MD  [facility] MCG/ACT inhaler Inhale 2 puffs into the lungs daily. 09/01/15   Historical Provider, MD  albuterol (PROVENTIL HFA;VENTOLIN HFA) 108 (90 BASE) MCG/ACT inhaler Inhale 1 puff into the lungs every 6 (six) hours as needed for shortness of breath.     Historical Provider, MD  ALPRAZolam (Duanne Moron 0.5 MG tablet Take 0.25 mg by mouth 2 (two) times daily as needed for anxiety or sleep.    Historical Provider, MD  ammonium lactate (LAC-HYDRIN) 12 % lotion Apply 1 application topically daily as needed for dry skin (psorias).     Historical Provider, MD  Artificial Tear Ointment (LUBRICANT EYE OP) Place 1 drop into both eyes as needed (for dry eyes).    Historical Provider, MD  aspirin 81 MG tablet Take 81 mg by mouth daily.    Historical Provider, MD  Cholecalciferol (VITAMIN D) 2000 UNITS CAPS Take 2,000 Units by mouth every morning.  Historical Provider, MD  feeding supplement, GLUCERNA SHAKE, (GLUCERNA SHAKE) LIQD Take 237 mLs by mouth daily at 3 pm.     Historical Provider, MD  ferrous sulfate 325 (65 FE) MG tablet Take 1 tablet (325 mg total) by mouth 2 (two) times daily with a meal. 03/21/14   Tora [facility],  PA-C  fluticasone (FLONASE) 50 MCG/ACT nasal spray Place 1 spray into both nostrils at bedtime as needed for allergies.     Historical Provider, MD  ipratropium-albuterol (DUONEB) 0.5-2.5 (3) MG/3ML SOLN Take 3 mLs by nebulization every 4 (four) hours as needed (shortness of breath).    Historical Provider, MD  Liniments ([employer] ARTHRITIS PAIN RELIEVER EX) Apply 1 application topically 2 (two) times daily as needed (for shoulder pains, knee, back.).     Historical Provider, MD  Multiple Vitamins-Minerals (ICAPS) CAPS Take 1 capsule by mouth every morning.     Historical Provider, MD  ONE TOUCH ULTRA TEST test strip 1 each by Other route See admin instructions. Check blood sugar twice daily 06/03/15   Historical Provider, MD  simethicone (MYLICON) 80 MG chewable tablet Chew 1 tablet (80 mg total) by mouth 4 (four) times daily as needed for flatulence. 12/04/13   Alison Murray, MD   Allergies  Allergen Reactions  . Amlodipine Besy-Benazepril Hcl Other (See Comments)    Nervous/shakiness  . Statins Other (See Comments)    "can't take any of them; cramp me up" (12/06/2012)  . Tradjenta [Linagliptin] Other (See Comments)    Extreme muscle pains, chest and back pains  . Plavix [Clopidogrel] Rash and Other (See Comments)    Severe burning of the skin.Pt states that the doctor told her that her rash was psoriasis and not related to plavix but she states the rash came from the plavix  . Raptiva [Efalizumab] Rash   Review of Systems  Unable to perform ROS: Patient nonverbal    Physical Exam  Constitutional: She appears well-developed and well-nourished.  Eyes:  Barely tracks to voices  Cardiovascular:  Tachycardic at times  Pulmonary/Chest: She has wheezes. She has rales.  Labored, trach in place  Neurological: She is alert.  Does not follow commands, does not respond to questions  Skin: Skin is warm and dry.  Psychiatric:  Expressive and receptive aphasia    Vital Signs: BP (!) 131/57   Pulse  64   Temp 99.3 F (37.4 C) (Oral)   Resp 20   Ht 5\' 7"  (1.702 m)   Wt 57.1 kg (125 lb 14.1 oz)   SpO2 92%   BMI 19.72 kg/m  Pain Assessment: CPOT   Pain Score: Asleep   SpO2: SpO2: 92 % O2 Device:SpO2: 92 % O2 Flow Rate: .O2 Flow Rate (L/min): 12 L/min  IO: Intake/output summary:  Intake/Output Summary (Last 24 hours) at 10/18/16 1413 Last data filed at 10/18/16 1400  Gross per 24 hour  Intake             2025 ml  Output              300 ml  Net             1725 ml    LBM: Last BM Date: 10/18/16 Baseline Weight: Weight: 80.7 kg (178 lb) Most recent weight: Weight: 57.1 kg (125 lb 14.1 oz)     Palliative Assessment/Data: PPS: 10%   Flowsheet Rows   Flowsheet Row Most Recent Value  Intake Tab  Referral Department  Hospitalist  Unit at  Time of Referral  ICU  Palliative Care Primary Diagnosis  Neurology  Date Notified  10/16/16  Palliative Care Type  New Palliative care  Reason for referral  Clarify Goals of Care, Advance Care Planning  Date of Admission  09/30/16  Date first seen by Palliative Care  10/17/16  # of days Palliative referral response time  1 Day(s)  # of days IP prior to Palliative referral  16  Clinical Assessment  Psychosocial & Spiritual Assessment  Palliative Care Outcomes  Patient/Family meeting held?  Yes [scheduled for 11/21]      Thank you for this consult. Palliative medicine will continue to follow and assist as needed.   Time In: 1300 Time Out: 1415 Time Total: 75 minutes Greater than 50%  of this time was spent counseling and coordinating care related to the above assessment and plan.  Signed by: Mariana Kaufman, AGNP-C Palliative Medicine    Please contact Palliative Medicine Team phone at [phone] for questions and concerns.  For individual provider: See Shea Evans

## 2016-10-19 DIAGNOSIS — I63411 Cerebral infarction due to embolism of right middle cerebral artery: Secondary | ICD-10-CM

## 2016-10-19 DIAGNOSIS — N183 Chronic kidney disease, stage 3 (moderate): Secondary | ICD-10-CM

## 2016-10-19 DIAGNOSIS — J441 Chronic obstructive pulmonary disease with (acute) exacerbation: Secondary | ICD-10-CM

## 2016-10-19 DIAGNOSIS — J439 Emphysema, unspecified: Secondary | ICD-10-CM

## 2016-10-19 LAB — GLUCOSE, CAPILLARY
GLUCOSE-CAPILLARY: 175 mg/dL — AB (ref 65–99)
GLUCOSE-CAPILLARY: 218 mg/dL — AB (ref 65–99)
Glucose-Capillary: 195 mg/dL — ABNORMAL HIGH (ref 65–99)
Glucose-Capillary: 219 mg/dL — ABNORMAL HIGH (ref 65–99)
Glucose-Capillary: 206 mg/dL — ABNORMAL HIGH (ref 65–99)

## 2016-10-19 LAB — TYPE AND SCREEN
ABO/RH(D): A NEG
Antibody Screen: NEGATIVE
Unit division: 0

## 2016-10-19 LAB — CBC
HCT: 40.9 % (ref 36.0–46.0)
Hemoglobin: 12.7 g/dL (ref 12.0–15.0)
MCH: 29.7 pg (ref 26.0–34.0)
MCHC: 31.1 g/dL (ref 30.0–36.0)
MCV: 95.8 fL (ref 78.0–100.0)
PLATELETS: 223 10*3/uL (ref 150–400)
RBC: 4.27 MIL/uL (ref 3.87–5.11)
RDW: 17.1 % — ABNORMAL HIGH (ref 11.5–15.5)
WBC: 6.4 10*3/uL (ref 4.0–10.5)

## 2016-10-19 MED ORDER — ARFORMOTEROL TARTRATE 15 MCG/2ML IN NEBU
15.0000 ug | INHALATION_SOLUTION | Freq: Two times a day (BID) | RESPIRATORY_TRACT | Status: DC
  Administered 2016-10-19 – 2016-10-20 (×2): 15 ug via RESPIRATORY_TRACT
  Filled 2016-10-19 (×3): qty 2

## 2016-10-19 MED ORDER — METHYLPREDNISOLONE SODIUM SUCC 40 MG IJ SOLR
40.0000 mg | Freq: Three times a day (TID) | INTRAMUSCULAR | Status: DC
  Administered 2016-10-19 – 2016-10-20 (×4): 40 mg via INTRAVENOUS
  Filled 2016-10-19 (×5): qty 1

## 2016-10-19 MED ORDER — COLLAGENASE 250 UNIT/GM EX OINT
TOPICAL_OINTMENT | Freq: Every day | CUTANEOUS | Status: DC
  Administered 2016-10-19 (×2): 1 via TOPICAL
  Administered 2016-10-20: 15:00:00 via TOPICAL
  Filled 2016-10-19: qty 30

## 2016-10-19 NOTE — Progress Notes (Signed)
PROGRESS NOTE    [name]  GYI:[id number] DOB: 09-08-1941 DOA: 09/30/2016 PCP: Leola Brazil, MD   Brief Narrative:  75 yo BF PMHx  Anxiety, CAD native artery S/P multiple stent LAD , Dysrhythmia, Heart murmur, HTN, MI, PVD,Pericardial effusion, COPD,Chronic bronchitis , Anemia of chronic illness Cirrhosis of liver without mention of alcohol,,Chronic back pain,Type II diabetes mellitus uncontrolled with complication,   Noted to be making gurgling noises by family and was not moving right side.  Last seen at baseline prior to going to bed on 11/02.  She had blood pressure of 224/113.  She was given labetalol.  There was concern for airway protection and she required intubation.  She had elevated lactic acid and given Abx.  She was also given solumedrol and neb tx.  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    Subjective: 11/22  patient's eyes open patient alert nods yes and no to questions. Follows commands. Moving all extremities to commands. Appears somewhat depressed    Assessment & Plan:   Principal Problem:   Arterial ischemic stroke, MCA (middle cerebral artery), left, acute (HCC) Active Problems:   CVA (cerebral vascular accident) ([place])   Pneumonia of left lower lobe due to infectious organism [facility])   Acute respiratory failure with hypoxemia ([place])   Cerebrovascular accident (CVA) due to embolism of left middle cerebral artery ([place])   Hemorrhagic stroke ([place])   Chronic combined systolic and diastolic heart failure (HCC)   Ventilator dependence ([place])   Tracheostomy status ([place])   Closed fracture of multiple pubic rami, right, sequela   Diverticulosis of intestine  without bleeding   Diabetes (HCC)   Chronic obstructive pulmonary disease (HCC)   Coronary artery disease involving native coronary artery of native heart without angina pectoris   Benign essential HTN   Mixed hyperlipidemia   Tachypnea   Hypernatremia   Hypokalemia   Hypoalbuminemia due to protein-calorie malnutrition (HCC)   Acute on chronic renal insufficiency   Lymphocytosis   Acute blood loss anemia   Macrocytic anemia   Pressure injury of skin   Acute on chronic systolic CHF (congestive heart failure) ([place])   Sinus arrhythmia   Uncontrolled type 2 diabetes mellitus with complication (HCC)   Tracheostomy care ([place])   Cerebral infarction due to embolism of right middle cerebral artery ([place])   Hypertensive emergency   COPD exacerbation ([place])   Acute renal failure with acute tubular necrosis superimposed on stage 3 chronic kidney disease ([place])   Iliac artery occlusion (HCC)   Acute renal failure with acute tubular necrosis superimposed on stage 2 chronic kidney disease ([place])   Acute Lt MCA infarct with hemorrhagic conversion - neurology signed off 11/08 - Unfortunately over the weekend Peer to Peer on-call requested by medical insurance requested, which did not occur  And per  [facility] [employer] states patient no longer eligible for LTAC -Patient currently alert and following commands  Acute on Chronic systolic CHF -Strict in and out since admission + 8.9 L -Daily weight Filed Weights   10/16/16 0435 10/18/16 0427 10/19/16 0500  Weight: 56.5 kg (124 lb 9 oz) 57.1 kg (125 lb 14.1 oz) 57.9 kg (127 lb 10.3 oz)  -Coreg 12.5 bid -Hydralazine 100 mg TID -  Transfuse for hemoglobin<8 -11/21 Transfuse one unit PRBC  Pulmonary hypertension -See CHF  HTN emergency -See CHF  Sinus arrhythmia/PVC -Most likely demand ischemia. Resolved -Most likely secondary to patient initially being on trach collar.  Occlusion of Lt external iliac artery.  -Unfortunately would not be  able to anticoagulate secondary to hemorrhagic conversion of CVA  Compromised airway in setting of CVA. 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 Tube feeds 45m/hr + 55 ml/hr  Hypernatremia -Resolved  Acute on chronic renal failure(Baseline Cr 1.0-1.2) Lab Results  Component Value Date   CREATININE 1.31 (H) 10/18/2016   CREATININE 1.33 (H) 10/17/2016   CREATININE 1.36 (H) 10/16/2016   Pubic rami fracture -Per orthopedics can weight bear as tolerated with walker  DM type II uncontrolled with complication -195/0Hemoglobin A1c =8.1  -Lantus 25 units daily -NovoLog 4 units q 4hr -Resistant SSI  Anemia  Recent Labs Lab 10/15/16 0353 10/16/16 0310 10/18/16 0315 10/18/16 0834 10/18/16 1956  HGB 7.1* 7.8* 6.6* 7.0* 8.3*  -Occult blood pending -See CHF   Goals of care -11/22 NCM has filed a gTourist information centre managerwith p[employer]requesting that they [facility]  DVT prophylaxis:  SQ heparin, SCDs Code Status: Full Family Communication: None Disposition Plan: CIR vs SNF   Consultants:  PCCM    Procedures/Significant Events:  CT head 11/03 >> Acute Lt MCA infarct with hemorrhagic conversion 11/03 Admit, neuro consulted CT angio chest/abd/pelvis 11/03 >> atelectasis, diverticulosis, non displaced fx of Rt superior and inferior pubic rami, complete occlusion of Lt external iliac artery, aneurysmal dilation of Lt common iliac, atherosclerosis 11/04 Start 3% NS 11/05 Extubated >> reintubated 11/6 Echocardiogram: LVEF=: 40% to 45%. Akinesis mid-apicalanteroseptal, anterior, anterolateral, lateral,  inferolateral, inferior, inferoseptal, and apical myocardium. -  (grade 1 diastolic dysfunction). -  Pulmonary arteries: PA peak pressure: 51 mm Hg (S). - Pericardium, extracardiac: small pericardial effusion,no evidence hemodynamic compromise. MRI brain 11/07 >> acute infarct Lt MCA with hemorrhagic conversion, cytotoxic edema 11/10 tracheostomy placement 11/16 S/P PEG tube placement 11/21 Transfuse one unit PRBC   VENTILATOR SETTINGS:    Cultures Blood 11/03 >>neg Urine 11/03 >> neg   Antimicrobials: Vancomycin 11/03 >> 11/03 Zosyn 11/03 >> 11/03    Devices    LINES / TUBES:  ETT 11/03 >> 11/05 ETT 11/05 >> 11/10 Lt PICC 11/05 >> 11/10 tracheostomy>>   Continuous Infusions: . sodium chloride Stopped (10/18/16 1700)  . feeding supplement (GLUCERNA 1.2 CAL) 1,000 mL (10/18/16 1914)     Objective: Vitals:   10/19/16 0300 10/19/16 0345 10/19/16 0400 10/19/16 0500  BP: (!) 155/68  (!) 158/69 (!) 163/81  Pulse: (!) 58  (!) 59 65  Resp: 19  20 (!) 23  Temp:  98.4 F (36.9 C)    TempSrc:  Oral    SpO2: 99%  99% 95%  Weight:    57.9 kg (127 lb 10.3 oz)  Height:        Intake/Output Summary (Last 24 hours) at 10/19/16 0746 Last data filed at 10/19/16 0400  Gross per 24 hour  Intake             1825 ml  Output                0 ml  Net             1825 ml   Filed Weights   10/16/16 0435 10/18/16 0427 10/19/16 0500  Weight: 56.5 kg (124 lb 9 oz) 57.1 kg (125 lb 14.1 oz) 57.9 kg (127 lb 10.3 oz)    Examination:  General: patient's eyes open patient alert nods yes and no to questions. Follows commands., Positive acute on chronic respiratory distress([place]) Eyes: negative scleral hemorrhage, negative anisocoria, negative icterus ENT: Negative Runny nose, negative gingival bleeding, Neck:  Negative scars, masses, torticollis, lymphadenopathy, JVD,  tracheostomy in place, negative sign of infection. Lungs: Clear to auscultation bilaterally without wheezes or crackles Cardiovascular: Regular rhythm and rate, without murmur gallop or rub normal S1 and  S2 Abdomen: negative abdominal pain, nondistended, positive soft, bowel sounds, no rebound, no ascites, no appreciable mass Extremities: No significant cyanosis, clubbing, or edema bilateral lower extremities. Positive amputation left metatarsals Skin: Negative rashes, lesions, ulcers Psychiatric:  Unable to fully assess  Central nervous system:  Patient moves extremities to command, nods yes and no to questions.   .     Data Reviewed: Care during the described time interval was provided by me .  I have reviewed this patient's available data, including medical history, events of note, physical examination, and all test results as part of my evaluation. I have personally reviewed and interpreted all radiology studies.  CBC:  Recent Labs Lab 10/14/16 0343 10/15/16 0353 10/16/16 0310 10/18/16 0315 10/18/16 0834 10/18/16 1956  WBC 12.6* 8.2 10.4 7.1  --   --   HGB 8.1* 7.1* 7.8* 6.6* 7.0* 8.3*  HCT 26.7* 22.6* 25.1* 22.1* 23.1* 26.9*  MCV 98.2 86.9 98.0 99.1  --   --   PLT 264 153 256 276  --   --    Basic Metabolic Panel:  Recent Labs Lab 10/13/16 0345 10/14/16 0343 10/15/16 0353 10/16/16 0310 10/17/16 0445 10/18/16 0315  NA 144 139 137 138 140 141  K 3.5 3.4* 4.7 4.4 4.5 4.9  CL 107 100* 103 103 105 106  CO2 31 29 26 27 27 27   GLUCOSE 146* 120* 156* 174* 108* 144*  BUN 45* 42* 18 46* 45* 47*  CREATININE 1.25* 1.36* 2.24* 1.36* 1.33* 1.31*  CALCIUM 9.4 9.2 9.2 9.3 9.3 9.6  MG 2.2 2.1 2.6* 2.3  --   --   PHOS 3.8  --   --   --   --   --    GFR: Estimated Creatinine Clearance: 33.4 mL/min (by C-G formula based on SCr of 1.31 mg/dL (H)). Liver Function Tests:  Recent Labs Lab 10/18/16 0315  AST 34  ALT 30  ALKPHOS 111  BILITOT 0.4  PROT 5.9*  ALBUMIN 1.9*   No results for input(s): LIPASE, AMYLASE in the last 168 hours. No results for input(s): AMMONIA in the last 168 hours. Coagulation Profile:  Recent Labs Lab 10/13/16 0345  INR 0.94   Cardiac  Enzymes:  Recent Labs Lab 10/13/16 0905  TROPONINI 0.04*   BNP (last 3 results) No results for input(s): PROBNP in the last 8760 hours. HbA1C: No results for input(s): HGBA1C in the last 72 hours. CBG:  Recent Labs Lab 10/18/16 1223 10/18/16 1644 10/18/16 1932 10/18/16 2338 10/19/16 0344  GLUCAP 165* 178* 155* 165* 175*   Lipid Profile: No results for input(s): CHOL, HDL, LDLCALC, TRIG, CHOLHDL, LDLDIRECT in the last 72 hours. Thyroid Function Tests: No results for input(s): TSH, T4TOTAL, FREET4, T3FREE, THYROIDAB in the last 72  hours. Anemia Panel: No results for input(s): VITAMINB12, FOLATE, FERRITIN, TIBC, IRON, RETICCTPCT in the last 72 hours. Urine analysis:    Component Value Date/Time   COLORURINE AMBER (A) 10/08/2016 1716   APPEARANCEUR TURBID (A) 10/08/2016 1716   LABSPEC 1.012 10/08/2016 1716   PHURINE 5.5 10/08/2016 1716   GLUCOSEU NEGATIVE 10/08/2016 1716   HGBUR MODERATE (A) 10/08/2016 1716   BILIRUBINUR NEGATIVE 10/08/2016 1716   KETONESUR NEGATIVE 10/08/2016 1716   PROTEINUR 100 (A) 10/08/2016 1716   UROBILINOGEN 0.2 04/15/2014 1208   NITRITE NEGATIVE 10/08/2016 1716   LEUKOCYTESUR LARGE (A) 10/08/2016 1716   Sepsis Labs: '@LABRCNTIP'$ (procalcitonin:4,lacticidven:4)  ) Recent Results (from the past 240 hour(s))  Aerobic Culture (superficial specimen)     Status: None (Preliminary result)   Collection Time: 10/18/16  6:30 PM  Result Value Ref Range Status   Specimen Description WOUND  Final   Special Requests [facility]LCER DISCHARGE  Final   Gram Stain   Final    FEW WBC PRESENT,BOTH PMN AND MONONUCLEAR RARE GRAM NEGATIVE RODS    Culture TOO YOUNG TO READ  Final   Report Status PENDING  Incomplete         Radiology Studies: Dg Chest Port 1 View  Result Date: 10/18/2016 CLINICAL DATA:  Acute respiratory acidosis EXAM: PORTABLE CHEST 1 VIEW COMPARISON:  10/18/2016 FINDINGS: Replacement of tracheostomy tube with the tip 3.6 cm superior to  the carina. A left upper extremity catheter tip overlies the SVC. Suspect small left-sided pleural effusion. Moderate cardiomegaly with mild central congestion. Hazy bibasilar atelectasis or infiltrate. No pneumothorax. IMPRESSION: 1. Small left-sided pleural effusion with hazy bibasilar atelectasis or infiltrate unchanged 2. Stable moderate cardiomegaly with mild central congestion Electronically Signed   By: Donavan Foil M.D.   On: 10/18/2016 18:53   Dg Chest Port 1 View  Result Date: 10/18/2016 CLINICAL DATA:  Respiratory failure EXAM: PORTABLE CHEST 1 VIEW COMPARISON:  10/11/2016 FINDINGS: Tracheostomy tube remains in place. Esophageal tube has removed. Left upper extremity catheter tip overlies the SVC. There are low lung volumes. Slightly improved aeration of the right lung base. There are hazy bibasilar opacities, atelectasis versus mild infiltrates. There is moderate cardiomegaly, similar compared to prior. Decreased central congestion. No pneumothorax. IMPRESSION: 1. Slightly improved aeration of the right lung base. 2. Hazy bibasilar atelectasis versus mild infiltrates 3. Stable cardiomegaly Electronically Signed   By: Donavan Foil M.D.   On: 10/18/2016 15:09        Scheduled Meds: . sodium chloride   Intravenous Once  . sodium chloride   Intravenous Once  . ALPRAZolam  0.25 mg Per Tube BID  . artificial tears  1 application Both Eyes BID  . budesonide (PULMICORT) nebulizer solution  0.5 mg Nebulization BID  . carvedilol  12.5 mg Per Tube BID WC  . chlorhexidine gluconate (MEDLINE KIT)  15 mL Mouth Rinse BID  . docusate  100 mg Per Tube BID  . dorzolamide  1 drop Right Eye BID  . ezetimibe  10 mg Per Tube Daily  . famotidine  20 mg Per Tube Daily  . feeding supplement (PRO-STAT SUGAR FREE 64)  30 mL Per Tube Daily  . heparin subcutaneous  5,000 Units Subcutaneous Q8H  . hydrALAZINE  100 mg Per Tube Q8H  . insulin aspart  0-20 Units Subcutaneous Q4H  . insulin aspart  4 Units  Subcutaneous Q4H  . insulin glargine  25 Units Subcutaneous Daily  . mouth rinse  15 mL Mouth Rinse QID  .  predniSONE  5 mg Per Tube Daily   Continuous Infusions: . sodium chloride Stopped (10/18/16 1700)  . feeding supplement (GLUCERNA 1.2 CAL) 1,000 mL (10/18/16 1914)     LOS: 19 days    Time spent: 40 minutes    WOODS, Geraldo Docker, MD [facility] Pager [phone]   If 7PM-7AM, please contact night-coverage [email or web address] Password [email or web address] 10/19/2016, 7:46 AM

## 2016-10-19 NOTE — Care Management Important Message (Signed)
Important Message  Patient Details  Name: [name] MRN: [id number] Date of Birth: [birth year]   Medicare Important Message Given:  Yes    Antwann Preziosi Abena 10/19/2016, 12:10 PM

## 2016-10-19 NOTE — Progress Notes (Signed)
   10/19/16 1025  Clinical Encounter Type  Visited With Patient  Visit Type Spiritual support  Spiritual Encounters  Spiritual Needs Sacred text  Stress Factors  Patient Stress Factors Health changes  Provided emotional support to Pt and read Psalm 91.

## 2016-10-19 NOTE — Progress Notes (Signed)
PULMONARY / CRITICAL CARE MEDICINE   Name: [name] MRN: [id number] DOB: 12/02/1940    ADMISSION DATE:  09/30/2016  REFERRING MD:  Dr. Cletus Gash, ER  CHIEF COMPLAINT:  Rt sided weakness.  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   CULTURES: Blood 11/03 >>neg Urine 11/03 >> neg  ANTIBIOTICS: Vancomycin 11/03 >> 11/03 Zosyn 11/03 >> 11/03   SIGNIFICANT EVENTS: 11/03 Admit, neuro consulted 11/04 Start 3% NS 11/05 Extubated >> reintubated 11/10 tracheostomy placement  LINES/TUBES: ETT 11/03 >> 11/05 ETT 11/05 >> 11/10 Lt PICC 11/05 >>   SUBJECTIVE:   Tolerated ATC this am. Was tachypneic and tried at 1 pm.   VITAL SIGNS: BP 130/87   Pulse 77   Temp 99.4 F (37.4 C) (Oral)   Resp (!) 35   Ht 5\' 7"  (1.702 m)   Wt 57.9 kg (127 lb 10.3 oz)   SpO2 92%   BMI 19.99 kg/m   VENTILATOR SETTINGS: Vent Mode: PRVC FiO2 (%):  [30 %-40 %] 40 % Set Rate:  [14 bmp-16 bmp] 14 bmp Vt Set:  [500 mL] 500 mL PEEP:  [5 cmH20] 5 cmH20 Plateau Pressure:  [20 cmH20-21 cmH20] 21 cmH20  INTAKE / OUTPUT:  Intake/Output Summary (Last 24 hours) at  10/19/16 1343 Last data filed at 10/19/16 0800  Gross per 24 hour  Intake             1190 ml  Output                0 ml  Net             1190 ml    PHYSICAL EXAMINATION: General: tachypneic, in resp distress. On ATC. Uses accesory muscles.  Neuro: awake, opens eyes, intermittently follows command, appears to be expressive and receptive aphasia,  HEENT: tracheostomy in place, right sided facial droop  Cardiovascular:  RRR, no mgr Lungs:  On trach collar, scattered rhonchi, (+) accesory muscle use. (+) wheezing in BULF.  Abdomen:  Soft, BS+, soft, non tender Skin:  Chronic venous stasis changes  LABS:  BMET  Recent Labs Lab 10/16/16 0310 10/17/16 0445 10/18/16 0315  NA 138 140 141  K 4.4 4.5 4.9  CL 103 105 106  CO2 27 27 27   BUN 46* 45* 47*  CREATININE 1.36* 1.33* 1.31*  GLUCOSE 174* 108* 144*    Electrolytes  Recent Labs Lab 10/13/16 0345 10/14/16 0343 10/15/16 0353 10/16/16 0310 10/17/16 0445 10/18/16 0315  CALCIUM 9.4 9.2 9.2 9.3 9.3 9.6  MG 2.2 2.1 2.6* 2.3  --   --   PHOS 3.8  --   --   --   --   --     CBC  Recent Labs Lab 10/16/16 0310 10/18/16 0315 10/18/16 0834 10/18/16 1956 10/19/16 0748  WBC 10.4 7.1  --   --  6.4  HGB 7.8* 6.6* 7.0* 8.3* 12.7  HCT 25.1* 22.1* 23.1* 26.9* 40.9  PLT 256 276  --   --  223    Coag's  Recent Labs Lab 10/13/16 0345  INR 0.94    Sepsis Markers No results for input(s): LATICACIDVEN, PROCALCITON, O2SATVEN in the last 168 hours.  ABG No results for input(s): PHART, PCO2ART, PO2ART in the last 168 hours.  Liver Enzymes  Recent Labs Lab 10/18/16 0315  AST 34  ALT 30  ALKPHOS 111  BILITOT 0.4  ALBUMIN 1.9*    Cardiac Enzymes  Recent Labs Lab 10/13/16 0905  TROPONINI 0.04*    Glucose  Recent Labs Lab 10/18/16 1644 10/18/16 1932 10/18/16 2338 10/19/16 0344 10/19/16 0901 10/19/16 1213  GLUCAP 178* 155* 165* 175* 218* 195*    ASSESSMENT / PLAN: Acute Hypoxemic respiratory  Failure. S/P Tracheostomy.  AECOPD Concern for pulm edema. Less likely HCAP.  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 Dysphagia secondary to CVA AoCKD 2 Pubic rami fracture DM type II Anemia Anxiety  Discussion This is a 75 year old female s/p Left MCA stroke w/ hemorrhagic conversion. She is now s/p trach d/t inability to protect airway. We are working on weaning from vent. Additional significant barriers currently include: both expressive and receptive aphasia, probable dysphagia, inability to protect airway and deconditioning. PST has been slowed down by pt becoming "tachypneic" while on ATC.    Plan: Pt did PST this am and was seen at 1pm to be in resp distress with accesory muscle use. Will place back on the vent. Second day she did this.  Start medrol for AECOPD.  Trache changed on 11/21 > awaiting cultures. Some purulent secretions. Off abx.  Cont vent support. ATC as tolerated. Anticipate may need LTAC.  Agree with addressing anxiety as it definitely contributes to tachypnea while on PST.  Cont pulmicort BID and atrovent QID.  Cont TF. Keep o2 sats > 88%.  No family at bedside.   Monica Becton, MD 10/19/2016, 1:43 PM [facility] Pager [phone] After 3 pm or if no answer, call [phone]

## 2016-10-19 NOTE — Consult Note (Signed)
[place] Nurse wound consult note Reason for Consult: trach wound Wound type: HADRPI (hospital acquired device related pressure injury) Pressure Ulcer POA: Yes/No Measurement: 2.5cm x 0.5cm x 0.2cm  Wound bed:95% yellow; 5% pink Drainage (amount, consistency, odor) moderate, serosanguinous, however feel that most of the drainage on the split gauze and the foam dressing is possibly related to trach change yesterday.  Do not note much drainage at the wound site this am. Periwound: intact but moist. Dressing procedure/placement/frequency: Will add enzymatic debridement ointment to clear the yellow slough, cover only with dry gauze until clean.  WOC will re-evaluate the wound next week to determine if any other topical care needed.  Lurline Idol has been changed, now the trach plate does not seem to be directly adding pressure to the affected site.    [place] Nurse team will follow along with you for weekly wound assessments.  Please notify me of any acute changes in the wounds or any new areas of concerns [place] MSN, [place], CNS [phone]

## 2016-10-19 NOTE — Progress Notes (Signed)
Pt was denied ltac. Dr Sherral Hammers would like [place] to resubmit to ins for [facility] for ltac. He feels pt really needs ltac and willing to do peer to peer if needed. Spoke w rep corina at [facility] and she will ck to see if they can resubmit.

## 2016-10-20 ENCOUNTER — Other Ambulatory Visit (HOSPITAL_COMMUNITY): Payer: Medicare Other

## 2016-10-20 ENCOUNTER — Inpatient Hospital Stay
Admission: RE | Admit: 2016-10-20 | Discharge: 2016-11-17 | Disposition: A | Discharge status: Skilled Nursing Facility | Payer: Medicare Other | Attending: Internal Medicine | Admitting: Internal Medicine

## 2016-10-20 DIAGNOSIS — R1313 Dysphagia, pharyngeal phase: Secondary | ICD-10-CM

## 2016-10-20 DIAGNOSIS — Z931 Gastrostomy status: Secondary | ICD-10-CM

## 2016-10-20 DIAGNOSIS — N939 Abnormal uterine and vaginal bleeding, unspecified: Secondary | ICD-10-CM

## 2016-10-20 DIAGNOSIS — J9690 Respiratory failure, unspecified, unspecified whether with hypoxia or hypercapnia: Secondary | ICD-10-CM

## 2016-10-20 DIAGNOSIS — I161 Hypertensive emergency: Secondary | ICD-10-CM

## 2016-10-20 DIAGNOSIS — R1310 Dysphagia, unspecified: Secondary | ICD-10-CM

## 2016-10-20 LAB — GLUCOSE, CAPILLARY
GLUCOSE-CAPILLARY: 152 mg/dL — AB (ref 65–99)
GLUCOSE-CAPILLARY: 200 mg/dL — AB (ref 65–99)
GLUCOSE-CAPILLARY: 261 mg/dL — AB (ref 65–99)
Glucose-Capillary: 242 mg/dL — ABNORMAL HIGH (ref 65–99)

## 2016-10-20 LAB — AEROBIC CULTURE W GRAM STAIN (SUPERFICIAL SPECIMEN)

## 2016-10-20 LAB — AEROBIC CULTURE  (SUPERFICIAL SPECIMEN)

## 2016-10-20 MED ORDER — POLYETHYLENE GLYCOL 3350 17 G PO PACK: 17.0000 g | PACK | Freq: Every day | ORAL | 0 refills | Status: AC | PRN

## 2016-10-20 MED ORDER — BUDESONIDE 0.5 MG/2ML IN SUSP: 0.5000 mg | Freq: Two times a day (BID) | RESPIRATORY_TRACT | 0 refills | Status: AC

## 2016-10-20 MED ORDER — HEPARIN SODIUM (PORCINE) 5000 UNIT/ML IJ SOLN: 5000.0000 [IU] | Freq: Three times a day (TID) | INTRAMUSCULAR | 0 refills | Status: AC

## 2016-10-20 MED ORDER — COLLAGENASE 250 UNIT/GM EX OINT: TOPICAL_OINTMENT | Freq: Every day | CUTANEOUS | 0 refills | Status: AC

## 2016-10-20 MED ORDER — HYDRALAZINE HCL 20 MG/ML IJ SOLN: 10.0000 mg | INTRAMUSCULAR | 0 refills | Status: AC | PRN

## 2016-10-20 MED ORDER — ORAL CARE MOUTH RINSE: 15.0000 mL | Freq: Four times a day (QID) | OROMUCOSAL | 0 refills | Status: AC

## 2016-10-20 MED ORDER — HYDRALAZINE HCL 100 MG PO TABS: 100.0000 mg | ORAL_TABLET | Freq: Three times a day (TID) | ORAL | 0 refills | Status: AC

## 2016-10-20 MED ORDER — INSULIN ASPART 100 UNIT/ML ~~LOC~~ SOLN: 6.0000 [IU] | SUBCUTANEOUS | 0 refills | Status: AC

## 2016-10-20 MED ORDER — FENTANYL CITRATE (PF) 100 MCG/2ML IJ SOLN: 50.0000 ug | INTRAMUSCULAR | 0 refills | Status: AC | PRN

## 2016-10-20 MED ORDER — GLUCERNA 1.2 CAL PO LIQD
1000.0000 mL | ORAL | 0 refills | Status: AC
Start: 2016-10-20 — End: ?

## 2016-10-20 MED ORDER — BISACODYL 10 MG RE SUPP: 10.0000 mg | Freq: Every day | RECTAL | 0 refills | Status: AC | PRN

## 2016-10-20 MED ORDER — ALPRAZOLAM 0.25 MG PO TABS: 0.2500 mg | ORAL_TABLET | Freq: Three times a day (TID) | ORAL | 0 refills | Status: AC | PRN

## 2016-10-20 MED ORDER — FAMOTIDINE 40 MG/5ML PO SUSR: 20.0000 mg | Freq: Every day | ORAL | 0 refills | Status: AC

## 2016-10-20 MED ORDER — IPRATROPIUM-ALBUTEROL 0.5-2.5 (3) MG/3ML IN SOLN: 3.0000 mL | RESPIRATORY_TRACT | 0 refills | Status: AC | PRN

## 2016-10-20 MED ORDER — INSULIN ASPART 100 UNIT/ML ~~LOC~~ SOLN: 0.0000 [IU] | SUBCUTANEOUS | 11 refills | Status: AC

## 2016-10-20 MED ORDER — DOCUSATE SODIUM 50 MG/5ML PO LIQD: 100.0000 mg | Freq: Two times a day (BID) | ORAL | 0 refills | Status: AC

## 2016-10-20 MED ORDER — PRO-STAT SUGAR FREE PO LIQD: 30.0000 mL | Freq: Every day | ORAL | 0 refills | Status: AC

## 2016-10-20 MED ORDER — ACETAMINOPHEN 160 MG/5ML PO SOLN: 650.0000 mg | Freq: Four times a day (QID) | ORAL | 0 refills | Status: AC | PRN

## 2016-10-20 MED ORDER — INSULIN ASPART 100 UNIT/ML ~~LOC~~ SOLN: 6.0000 [IU] | SUBCUTANEOUS | Status: DC

## 2016-10-20 MED ORDER — LABETALOL HCL 5 MG/ML IV SOLN: 10.0000 mg | INTRAVENOUS | 0 refills | Status: AC | PRN

## 2016-10-20 MED ORDER — ARFORMOTEROL TARTRATE 15 MCG/2ML IN NEBU: 15.0000 ug | INHALATION_SOLUTION | Freq: Two times a day (BID) | RESPIRATORY_TRACT | 0 refills | Status: AC

## 2016-10-20 MED ORDER — TRAMADOL HCL 50 MG PO TABS: 50.0000 mg | ORAL_TABLET | Freq: Four times a day (QID) | ORAL | 0 refills | Status: AC | PRN

## 2016-10-20 MED ORDER — EZETIMIBE 10 MG PO TABS: 10.0000 mg | ORAL_TABLET | Freq: Every day | ORAL | 0 refills | Status: AC

## 2016-10-20 MED ORDER — CARVEDILOL 12.5 MG PO TABS: 12.5000 mg | ORAL_TABLET | Freq: Two times a day (BID) | ORAL | 0 refills | Status: AC

## 2016-10-20 MED ORDER — IOPAMIDOL (ISOVUE-300) INJECTION 61%
INTRAVENOUS | Status: AC
  Filled 2016-10-20: qty 50

## 2016-10-20 MED ORDER — DORZOLAMIDE HCL 2 % OP SOLN: 1.0000 [drp] | Freq: Two times a day (BID) | OPHTHALMIC | 12 refills | Status: AC

## 2016-10-20 MED ORDER — INSULIN GLARGINE 100 UNIT/ML ~~LOC~~ SOLN: 25.0000 [IU] | Freq: Every day | SUBCUTANEOUS | 0 refills | Status: AC

## 2016-10-20 MED ORDER — METHYLPREDNISOLONE SODIUM SUCC 40 MG IJ SOLR: 40.0000 mg | Freq: Three times a day (TID) | INTRAMUSCULAR | 0 refills | Status: AC

## 2016-10-20 MED ORDER — CHLORHEXIDINE GLUCONATE 0.12% ORAL RINSE (MEDLINE KIT): 15.0000 mL | Freq: Two times a day (BID) | OROMUCOSAL | 0 refills | Status: AC

## 2016-10-20 MED ORDER — ARTIFICIAL TEARS OP OINT: 1.0000 "application " | TOPICAL_OINTMENT | Freq: Two times a day (BID) | OPHTHALMIC | 0 refills | Status: AC

## 2016-10-20 NOTE — Discharge Summary (Signed)
Physician Discharge Summary  [name] QZR:[id number] DOB: [birth year] DOA: 09/30/2016  PCP: Leola Brazil, MD  Admit date: 09/30/2016 Discharge date: 10/20/2016  Time spent: 35 minutes  Recommendations for Outpatient Follow-up:  Acute Lt MCA infarct with hemorrhagic conversion - neurology signed off 11/08 -Patient currently alert and following commands  Acute on Chronic systolic CHF -Strict in and out since admission + 7.5  L -Daily weight Filed Weights   10/18/16 0427 10/19/16 0500 10/20/16 0700  Weight: 87.1 kg (192 lb 0.3 oz) 87.6 kg (193 lb 2 oz) 83.3 kg (183 lb 10.3 oz)  -Coreg 12.5 bid -Hydralazine 100 mg TID -Transfuse for hemoglobin<8 -11/21 Transfuse one unit PRBC  Pulmonary hypertension -See CHF  HTN emergency -See CHF  Sinus arrhythmia/PVC -Most likely demand ischemia. Resolved -Most likely secondary to patient initially being on trach collar. -Resolved   Occlusion of Lt external iliac artery.  -Unfortunately would not be able to anticoagulate secondary to hemorrhagic conversion of CVA  Compromised airway in setting of CVA. 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 Tube feeds 68m/hr + 55 ml/hr  Hypernatremia -Resolved  Acute on chronic renal failure(Baseline Cr 1.0-1.2) Lab Results  Component Value Date   CREATININE 1.31 (H) 10/18/2016   CREATININE 1.33 (H) 10/17/2016   CREATININE 1.36 (H) 10/16/2016    Pubic rami fracture -Per orthopedics can weight bear as tolerated with walker  DM type II uncontrolled with complication -135/4Hemoglobin A1c =8.1  -Lantus 25 units daily -NovoLog 6 units q 4hr -Resistant SSI  Anemia  Recent Labs Lab 10/16/16 0310 10/18/16 0315 10/18/16 0834  10/18/16 1956 10/19/16 0748  HGB 7.8* 6.6* 7.0* 8.3* 12.7  -Stable    Discharge Diagnoses:  Principal Problem:   Arterial ischemic stroke, MCA (middle cerebral artery), left, acute (HCC) Active Problems:   CVA (cerebral vascular accident) ([place]   Pneumonia of left lower lobe due to infectious organism ([facility]   Acute respiratory failure with hypoxemia ([facility]   Cerebrovascular accident (CVA) due to embolism of left middle cerebral artery ([facility]   Hemorrhagic stroke ([facility]   Chronic combined systolic and diastolic heart failure (HCC)   Ventilator dependence ([place]   Tracheostomy status (HCC)   Closed fracture of multiple pubic rami, right, sequela   Diverticulosis of intestine without bleeding   Diabetes ([facility]   Pulmonary emphysema (HCC)   Coronary artery disease involving native coronary artery of native heart without angina pectoris   Benign essential HTN   Mixed hyperlipidemia   Tachypnea   Hypernatremia   Hypokalemia   Hypoalbuminemia due to protein-calorie malnutrition (HCC)   Acute on chronic renal insufficiency   Lymphocytosis   Acute blood loss anemia   Macrocytic anemia   Pressure injury of skin   Acute on chronic systolic CHF (congestive heart failure) ([facility]   Sinus arrhythmia   Uncontrolled type 2 diabetes mellitus with complication (HCC)   Tracheostomy care ([facility]   Cerebral infarction due to embolism of right middle cerebral artery (HCC)   Hypertensive emergency   COPD exacerbation ([facility]   Acute renal failure with acute tubular necrosis superimposed on stage 3 chronic kidney disease ([facility]   Iliac artery occlusion (HCC)   Acute renal failure with acute tubular necrosis superimposed on stage 2 chronic kidney disease ([facility]   Discharge Condition:  Guarded  Diet recommendation: Tube feeds 37m/hr + 55 ml/hr  Filed Weights   10/18/16 0427 10/19/16 0500 10/20/16 0700  Weight: 87.1 kg (192 lb 0.3 oz) 87.6 kg (193 lb 2 oz) 83.3 kg (183 lb 10.3 oz)    History of  present illness:  75yo BF PMHx Anxiety, CAD native artery S/P multiple stent LAD , Dysrhythmia,Heart murmur, HTN, MI, PVD,Pericardial effusion, COPD,Chronic bronchitis , Anemia of chronic illness Cirrhosis of liver without mention of alcohol,,Chronic back pain,Type II diabetes mellitus uncontrolled with complication,   Noted to be making gurgling noises by family and was not moving right side. Last seen at baseline prior to going to bed on 11/02. She had blood pressure of 224/113. She was given labetalol. There was concern for airway protection and she required intubation. She had elevated lactic acid and given Abx. She was also given solumedrol and neb tx.  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 She had CT angio chest/abd/pelvis, and these showed atelectasis, diverticulosis, non displaced fx of Rt superior and inferior pubic rami, complete occlusion of Lt external iliac artery, aneurysmal dilation of Lt common iliac, atherosclerosis      Consultants:  PCCM    Procedures/Significant Events:  CT head 11/03 >> Acute Lt MCA infarct with hemorrhagic conversion 11/03 Admit, neuro consulted CT angio chest/abd/pelvis 11/03 >> atelectasis, diverticulosis, non displaced fx of Rt superior and inferior pubic rami, complete occlusion of Lt external iliac artery, aneurysmal dilation of Lt common iliac, atherosclerosis 11/04 Start 3% NS 11/05 Extubated >> reintubated 11/6 Echocardiogram: LVEF=: 40% to 45%. Akinesismid-apicalanteroseptal, anterior, anterolateral, lateral, inferolateral, inferior, inferoseptal, and apical myocardium. -(grade 1 diastolic dysfunction). - Pulmonary arteries:PA peak pressure: 51 mm Hg (S). - Pericardium, extracardiac: small pericardial effusion,no evidence hemodynamic compromise. MRI brain 11/07 >> acute infarct Lt MCA with hemorrhagic conversion, cytotoxic edema 11/10 tracheostomy  placement 11/16 S/P PEG tube placement 11/21 Transfuse one unit PRBC   VENTILATOR SETTINGS: Vent mode: PRVC Vt set: 5065mSet rate: 16 FiO2: 30% PEEP: 5   Currently on TC 8 L/m SPO2 100%   Cultures Blood 11/03 >>neg Urine 11/03 >> neg   Antimicrobials: Vancomycin 11/03 >> 11/03 Zosyn 11/03 >>11/03    Devices               LINES / TUBES:  ETT 11/03 >> 11/05 ETT 11/05 >>11/10 Lt PICC 11/05 >> 11/10 tracheostomy>>   Discharge Exam: Vitals:   10/20/16 0849 10/20/16 0900 10/20/16 1100 10/20/16 1200  BP:  (!) 148/70 (!) 131/53   Pulse:  74 60   Resp:  (!) 27 (!) 22   Temp: 99 F (37.2 C)   98.6 F (37 C)  TempSrc: Oral   Oral  SpO2:  94% 100%   Weight:      Height:        General: patient's eyes open patient alert nods yes and no to questions. Follows commands., Positive acute on chronic respiratory distress(Resolving) Eyes: negative scleral hemorrhage, negative anisocoria, negative icterus ENT: Negative Runny nose, negative gingival bleeding, Neck:  Negative scars, masses, torticollis, lymphadenopathy, JVD,  tracheostomy in place, negative sign of infection. Lungs: Clear to auscultation bilaterally without wheezes or crackles Cardiovascular: Regular rhythm and rate, without murmur gallop or rub normal S1 and S2   Discharge Instructions  Discharge Instructions    Ambulatory referral to Neurology    Complete by:  As directed    Pt will follow up with Dr.  Xu at [facility] in about 2 months. Thanks.       Medication List    STOP taking these medications   ADVAIR HFA 115-21 MCG/ACT inhaler Generic drug:  fluticasone-salmeterol   albuterol 108 (90 Base) MCG/ACT inhaler Commonly known as:  PROVENTIL HFA;VENTOLIN HFA   ammonium lactate 12 % lotion Commonly known as:  LAC-HYDRIN   aspirin 81 MG tablet   calcipotriene 0.005 % cream Commonly known as:  DOVONOX   [employer] ARTHRITIS PAIN RELIEVER EX   esomeprazole 40 MG capsule Commonly known as:   NEXIUM   ferrous sulfate 325 (65 FE) MG tablet   fluticasone 50 MCG/ACT nasal spray Commonly known as:  FLONASE   furosemide 40 MG tablet Commonly known as:  LASIX   glimepiride 2 MG tablet Commonly known as:  AMARYL   ICAPS Caps   metFORMIN 500 MG tablet Commonly known as:  GLUCOPHAGE   nystatin 100000 UNIT/ML suspension Commonly known as:  MYCOSTATIN   ONE TOUCH ULTRA TEST test strip Generic drug:  glucose blood   pravastatin 10 MG tablet Commonly known as:  PRAVACHOL   predniSONE 5 MG tablet Commonly known as:  DELTASONE   ranitidine 150 MG tablet Commonly known as:  ZANTAC   simethicone 80 MG chewable tablet Commonly known as:  MYLICON   Travoprost (BAK Free) 0.004 % Soln ophthalmic solution Commonly known as:  TRAVATAN   Vitamin D 2000 units Caps     TAKE these medications   acetaminophen 160 MG/5ML solution Commonly known as:  TYLENOL Place 20.3 mLs (650 mg total) into feeding tube every 6 (six) hours as needed for mild pain, headache or fever.   ALPRAZolam 0.25 MG tablet Commonly known as:  XANAX Place 1 tablet (0.25 mg total) into feeding tube 3 (three) times daily as needed for anxiety. What changed:  medication strength  how to take this  when to take this  reasons to take this   arformoterol 15 MCG/2ML Nebu Commonly known as:  BROVANA Take 2 mLs (15 mcg total) by nebulization 2 (two) times daily.   artificial tears Oint ophthalmic ointment Place 1 application into both eyes 2 (two) times daily. What changed:  how much to take  when to take this  reasons to take this   bisacodyl 10 MG suppository Commonly known as:  DULCOLAX Place 1 suppository (10 mg total) rectally daily as needed for moderate constipation.   budesonide 0.5 MG/2ML nebulizer solution Commonly known as:  PULMICORT Take 2 mLs (0.5 mg total) by nebulization 2 (two) times daily.   carvedilol 12.5 MG tablet Commonly known as:  COREG Place 1 tablet (12.5 mg  total) into feeding tube 2 (two) times daily with a meal. What changed:  See the new instructions.   chlorhexidine gluconate (MEDLINE KIT) 0.12 % solution Commonly known as:  PERIDEX 15 mLs by Mouth Rinse route 2 (two) times daily.   collagenase ointment Commonly known as:  SANTYL Apply topically daily.   docusate 50 MG/5ML liquid Commonly known as:  COLACE Place 10 mLs (100 mg total) into feeding tube 2 (two) times daily.   dorzolamide 2 % ophthalmic solution Commonly known as:  TRUSOPT Place 1 drop into the right eye 2 (two) times daily.   ezetimibe 10 MG tablet Commonly known as:  ZETIA Place 1 tablet (10 mg total) into feeding tube daily. Start taking on:  10/21/2016   famotidine 40 MG/5ML suspension Commonly known as:  PEPCID Place 2.5 mLs (20 mg total) into  feeding tube daily. Start taking on:  10/21/2016   feeding supplement (GLUCERNA 1.2 CAL) Liqd Place 1,000 mLs into feeding tube continuous. What changed:  how much to take  how to take this  when to take this   feeding supplement (PRO-STAT SUGAR FREE 64) Liqd Place 30 mLs into feeding tube daily. Start taking on:  10/21/2016   fentaNYL 100 MCG/2ML injection Commonly known as:  SUBLIMAZE Inject 1 mL (50 mcg total) into the vein every hour as needed for severe pain.   heparin 5000 UNIT/ML injection Inject 1 mL (5,000 Units total) into the skin every 8 (eight) hours.   hydrALAZINE 20 MG/ML injection Commonly known as:  APRESOLINE Inject 0.5-2 mLs (10-40 mg total) into the vein every 4 (four) hours as needed (SBP > 160).   hydrALAZINE 100 MG tablet Commonly known as:  APRESOLINE Place 1 tablet (100 mg total) into feeding tube every 8 (eight) hours.   insulin aspart 100 UNIT/ML injection Commonly known as:  novoLOG Inject 0-20 Units into the skin every 4 (four) hours.   insulin aspart 100 UNIT/ML injection Commonly known as:  novoLOG Inject 6 Units into the skin every 4 (four) hours.   insulin  glargine 100 UNIT/ML injection Commonly known as:  LANTUS Inject 0.25 mLs (25 Units total) into the skin daily. Start taking on:  10/21/2016   ipratropium-albuterol 0.5-2.5 (3) MG/3ML Soln Commonly known as:  DUONEB Take 3 mLs by nebulization every 2 (two) hours as needed. What changed:  when to take this  reasons to take this   labetalol 5 MG/ML injection Commonly known as:  NORMODYNE,TRANDATE Inject 2 mLs (10 mg total) into the vein every 2 (two) hours as needed (SBP > 160).   methylPREDNISolone sodium succinate 40 mg/mL injection Commonly known as:  SOLU-MEDROL Inject 1 mL (40 mg total) into the vein every 8 (eight) hours.   mouth rinse Liqd solution 15 mLs by Mouth Rinse route QID.   polyethylene glycol packet Commonly known as:  MIRALAX / GLYCOLAX Place 17 g into feeding tube daily as needed for moderate constipation.   traMADol 50 MG tablet Commonly known as:  ULTRAM Place 1 tablet (50 mg total) into feeding tube every 6 (six) hours as needed for moderate pain.      Allergies  Allergen Reactions  . Amlodipine Besy-Benazepril Hcl Other (See Comments)    Nervous/shakiness  . Statins Other (See Comments)    "can't take any of them; cramp me up" (12/06/2012)  . Tradjenta [Linagliptin] Other (See Comments)    Extreme muscle pains, chest and back pains  . Plavix [Clopidogrel] Rash and Other (See Comments)    Severe burning of the skin.Pt states that the doctor told her that her rash was psoriasis and not related to plavix but she states the rash came from the plavix  . Raptiva [Efalizumab] Rash   Follow-up Information    Swinteck, Horald Pollen, MD. Schedule an appointment as soon as possible for a visit in 4 week(s).   Specialty:  Orthopedic Surgery Contact information: [address] [place] [address] [phone]        Xu,Jindong, MD. Schedule an appointment as soon as possible for a visit in 6 week(s).   Specialty:  Neurology Contact  information: [address] [place] [address] [phone]            The results of significant diagnostics from this hospitalization (including imaging, microbiology, ancillary and laboratory) are listed below for  reference.    Significant Diagnostic Studies: Ct Angio Head W Or Wo Contrast  Result Date: 10/03/2016 CLINICAL DATA:  Left MCA infarct with hemorrhagic transformation. EXAM: CT ANGIOGRAPHY HEAD AND NECK TECHNIQUE: Multidetector CT imaging of the head and neck was performed using the standard protocol during bolus administration of intravenous contrast. Multiplanar CT image reconstructions and MIPs were obtained to evaluate the vascular anatomy. Carotid stenosis measurements (when applicable) are obtained utilizing NASCET criteria, using the distal internal carotid diameter as the denominator. CONTRAST:  50 mL Isovue 370 IV COMPARISON:  Head CT 10/01/2016 FINDINGS: CT HEAD FINDINGS Brain: Focus of intraparenchymal hemorrhage within the inferior left frontal lobe is unchanged in size measuring up to 2.2 cm. Extensive hypoattenuation within the left MCA distribution has increased from the prior study. Suspected small amount of left temporoparietal subarachnoid blood/petechial hemorrhage is unchanged. No midline shift. Mild mass effect on the left lateral ventricle is unchanged. Again noted is a cavum septum pellucidum et vergae. Review of the MIP images confirms the above findings CTA NECK FINDINGS Aortic arch: There is atherosclerotic calcification of the aortic arch. Proximal subclavian arteries are patent. Normal three-vessel branch pattern. Right carotid system: There is atherosclerotic calcification of the right carotid bifurcation without hemodynamically significant stenosis. Left carotid system: There is noncalcified plaque at the left carotid bifurcation, resulting in stenosis measuring 40%. Vertebral arteries: There is at least moderate narrowing of the right  vertebral artery origin. There is also at least moderate narrowing of the left vertebral artery origin. There is moderate narrowing of the distal right V1 segment. There is multifocal atherosclerotic calcification along the course of both V2 segments without hemodynamically significant stenosis. The vertebral system is left dominant. There is a large amount of eccentric calcified plaque within the left V3 segment the causes at least 50% luminal narrowing. There is focal narrowing of the left V4 segment just proximal to the confluence with the basilar artery. Skeleton: No advanced bony spinal canal stenosis. No lytic or blastic osseous lesions. Other neck: Bilateral lens replacements. Near complete opacification of the left maxillary sinus. The patient is intubated. Upper chest: Negative Review of the MIP images confirms the above findings CTA HEAD FINDINGS Anterior circulation: --Intracranial internal carotid arteries: There is calcification of both proximal intracranial internal carotid arteries resulting in approximately 50% stenosis bilaterally. This is slightly worse on the left. --Anterior cerebral arteries: Normal. --Middle cerebral arteries: Normal. --Posterior communicating arteries: Absent bilaterally. Posterior circulation: --Posterior cerebral arteries: Normal. --Superior cerebellar arteries: Normal. --Basilar artery: Normal. --Anterior inferior cerebellar arteries: Normal. --Posterior inferior cerebellar arteries: Normal. Venous sinuses: As permitted by contrast timing, patent. Anatomic variants: None Delayed phase: No abnormal intracranial enhancement. Review of the MIP images confirms the above findings IMPRESSION: 1. Unchanged size of intraparenchymal hemorrhage within the left frontal lobe, with small amount of associated parietotemporal petechial or subarachnoid blood. No new mass effect or hydrocephalus. Hypoattenuation throughout the left MCA distribution is consistent with expected evolution. 2.  No intracranial arterial occlusion. 3. 40% stenosis of the proximal left internal carotid artery secondary to noncalcified atherosclerotic plaque. 4. Bilateral vertebral artery origin narrowing that is at least moderate. 5. Multifocal mild-to-moderate narrowing along the courses of both vertebral arteries. 6. Aortic atherosclerosis. Electronically Signed   By: Ulyses Jarred M.D.   On: 10/03/2016 05:49   Dg Abd 1 View  Result Date: 10/07/2016 CLINICAL DATA:  Evaluate NG tube placement. EXAM: ABDOMEN - 1 VIEW COMPARISON:  10/02/2016 FINDINGS: The feeding tube tip is in the projecting  over the lower abdomen. This is in the projection of the expected location of the descending duodenum. Bowel loops appear nondilated. IMPRESSION: 1. The tip of the feeding tube projects over the expected location of the descending duodenum. Electronically Signed   By: Kerby Moors M.D.   On: 10/07/2016 17:08   Ct Head Wo Contrast  Result Date: 10/01/2016 CLINICAL DATA:  Stroke. EXAM: CT HEAD WITHOUT CONTRAST TECHNIQUE: Contiguous axial images were obtained from the base of the skull through the vertex without intravenous contrast. COMPARISON:  09/30/2016 FINDINGS: Brain: An acute left MCA infarct is again seen. 2.2 cm focus of parenchymal hemorrhage in the left frontal lobe appears minimally larger than on the prior study (previously 2.0 cm). There is increased petechial hemorrhage in the posterior left temporal lobe. Less hypodense appearance of some portions of the infarct compared to yesterday's study may reflect a combination of reperfusion and petechial hemorrhage. The infarct again involves the left frontal, temporal, and parietal lobes as well as insula without evidence of significant interval extension. There is slightly increased effacement of the left lateral ventricle without significant midline shift or evidence of hydrocephalus. Overall cerebral volume is within normal limits for age. A cavum septum pellucidum et  vergae is noted. Patchy cerebral white matter hypodensities bilaterally are unchanged and compatible with mild chronic small vessel ischemic disease. Vascular: Calcified atherosclerosis at the skullbase. Hyperdense left MCA branch vessel on the sylvian fissure as previously seen. Skull: No acute fracture. Mild diffuse skull heterogeneity with numerous subcentimeter lucencies, nonspecific. Sinuses/Orbits: Chronic sinusitis with unchanged partial left maxillary sinus opacification. Chronic defects in the anterior and posterior walls of the right frontal sinus. Clear mastoid air cells. Prior bilateral cataract extraction. Other: None. IMPRESSION: 1. Evolving acute left MCA infarct with hemorrhagic transformation. Mildly increased petechial hemorrhage and slightly increased size of 2.2 cm left frontal lobe parenchymal hematoma. 2. Slightly increased mass effect without midline shift or hydrocephalus. Electronically Signed   By: Logan Bores M.D.   On: 10/01/2016 12:57   Ct Head Wo Contrast  Result Date: 09/30/2016 CLINICAL DATA:  Right-sided weakness EXAM: CT HEAD WITHOUT CONTRAST TECHNIQUE: Contiguous axial images were obtained from the base of the skull through the vertex without intravenous contrast. COMPARISON:  MRI 07/24/2009 FINDINGS: Brain: Acute infarct in the left MCA territory. Low-density is seen in the insula extending into the left frontal temporal lobe and left parietal lobe. There is hemorrhagic transformation with blood in the left frontal temporal lobe and in the left parietal lobe. Generalized atrophy. Negative for hydrocephalus. Low-density in the parietal white matter bilaterally appears chronic. Brainstem and cerebellum intact. Vascular: Hyperdense vessel in the sylvian fissure compatible with thrombosed left MCA branch leading to acute infarct. M1 does not appear hyperdense. Skull: Negative Sinuses/Orbits: Mucosal edema in the paranasal sinuses. NG tube in place. Normal orbit. Other: None  IMPRESSION: 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 No shift of the midline structures. Atrophy and chronic ischemic change in the white matter bilaterally These results were called by telephone at the time of interpretation on 09/30/2016 at 11:51 am to Dr. Rockne Menghini , who verbally acknowledged these results. Electronically Signed   By: Franchot Gallo M.D.   On: 09/30/2016 11:52   Ct Angio Neck W Or Wo Contrast  Result Date: 10/03/2016 CLINICAL DATA:  Left MCA infarct with hemorrhagic transformation. EXAM: CT ANGIOGRAPHY HEAD AND NECK TECHNIQUE: Multidetector CT imaging of the head and neck was performed using the standard protocol during bolus administration of  intravenous contrast. Multiplanar CT image reconstructions and MIPs were obtained to evaluate the vascular anatomy. Carotid stenosis measurements (when applicable) are obtained utilizing NASCET criteria, using the distal internal carotid diameter as the denominator. CONTRAST:  50 mL Isovue 370 IV COMPARISON:  Head CT 10/01/2016 FINDINGS: CT HEAD FINDINGS Brain: Focus of intraparenchymal hemorrhage within the inferior left frontal lobe is unchanged in size measuring up to 2.2 cm. Extensive hypoattenuation within the left MCA distribution has increased from the prior study. Suspected small amount of left temporoparietal subarachnoid blood/petechial hemorrhage is unchanged. No midline shift. Mild mass effect on the left lateral ventricle is unchanged. Again noted is a cavum septum pellucidum et vergae. Review of the MIP images confirms the above findings CTA NECK FINDINGS Aortic arch: There is atherosclerotic calcification of the aortic arch. Proximal subclavian arteries are patent. Normal three-vessel branch pattern. Right carotid system: There is atherosclerotic calcification of the right carotid bifurcation without hemodynamically significant stenosis. Left carotid system: There is noncalcified plaque at the left carotid bifurcation,  resulting in stenosis measuring 40%. Vertebral arteries: There is at least moderate narrowing of the right vertebral artery origin. There is also at least moderate narrowing of the left vertebral artery origin. There is moderate narrowing of the distal right V1 segment. There is multifocal atherosclerotic calcification along the course of both V2 segments without hemodynamically significant stenosis. The vertebral system is left dominant. There is a large amount of eccentric calcified plaque within the left V3 segment the causes at least 50% luminal narrowing. There is focal narrowing of the left V4 segment just proximal to the confluence with the basilar artery. Skeleton: No advanced bony spinal canal stenosis. No lytic or blastic osseous lesions. Other neck: Bilateral lens replacements. Near complete opacification of the left maxillary sinus. The patient is intubated. Upper chest: Negative Review of the MIP images confirms the above findings CTA HEAD FINDINGS Anterior circulation: --Intracranial internal carotid arteries: There is calcification of both proximal intracranial internal carotid arteries resulting in approximately 50% stenosis bilaterally. This is slightly worse on the left. --Anterior cerebral arteries: Normal. --Middle cerebral arteries: Normal. --Posterior communicating arteries: Absent bilaterally. Posterior circulation: --Posterior cerebral arteries: Normal. --Superior cerebellar arteries: Normal. --Basilar artery: Normal. --Anterior inferior cerebellar arteries: Normal. --Posterior inferior cerebellar arteries: Normal. Venous sinuses: As permitted by contrast timing, patent. Anatomic variants: None Delayed phase: No abnormal intracranial enhancement. Review of the MIP images confirms the above findings IMPRESSION: 1. Unchanged size of intraparenchymal hemorrhage within the left frontal lobe, with small amount of associated parietotemporal petechial or subarachnoid blood. No new mass effect or  hydrocephalus. Hypoattenuation throughout the left MCA distribution is consistent with expected evolution. 2. No intracranial arterial occlusion. 3. 40% stenosis of the proximal left internal carotid artery secondary to noncalcified atherosclerotic plaque. 4. Bilateral vertebral artery origin narrowing that is at least moderate. 5. Multifocal mild-to-moderate narrowing along the courses of both vertebral arteries. 6. Aortic atherosclerosis. Electronically Signed   By: Ulyses Jarred M.D.   On: 10/03/2016 05:49   Mr Brain Wo Contrast  Result Date: 10/04/2016 CLINICAL DATA:  Right-sided weakness.  Hemorrhagic infarct. EXAM: MRI HEAD WITHOUT CONTRAST TECHNIQUE: Multiplanar, multiecho pulse sequences of the brain and surrounding structures were obtained without intravenous contrast. COMPARISON:  Head CT and CTA from yesterday. FINDINGS: Brain: Restricted diffusion throughout the large majority of the left MCA territory with superimposed petechial hemorrhage and anterior frontal parenchymal hematoma measuring 14 mm. Background of moderate chronic microvascular disease in the cerebral white matter. Expected cytotoxic edema  causes effacement of left lateral ventricle. No midline shift or herniation. Vascular: Preserved major flow voids. Skull and upper cervical spine: Normal marrow signal. Sinuses/Orbits: Chronic maxillary sinusitis with atelectasis and wall sclerosis on the left. IMPRESSION: Acute infarct involving the large majority of the left MCA territory. Hemorrhagic conversion is stable compared to head CT from yesterday. Stable mass effect from cytotoxic edema; no midline shift. Electronically Signed   By: Monte Fantasia M.D.   On: 10/04/2016 15:10   Ir Gastrostomy Tube Mod Sed  Result Date: 10/13/2016 INDICATION: History of left MCA infarct with hemorrhagic transformation, now with altered mental status. As such, request made for placement of a gastrostomy tube for long-term enteric nutrition  supplementation. EXAM: PULL TROUGH GASTROSTOMY TUBE PLACEMENT COMPARISON:  None. MEDICATIONS: Ancef 2 gm IV; Antibiotics were administered within 1 hour of the procedure. Glucagon 1 mg IV CONTRAST:  20 cc Isovue 300 administered into the gastric lumen. ANESTHESIA/SEDATION: Moderate (conscious) sedation was employed during this procedure. A total of Versed 2 mg and Fentanyl 100 mcg was administered intravenously. Moderate Sedation Time: 40 minutes. The patient's level of consciousness and vital signs were monitored continuously by radiology nursing throughout the procedure under my direct supervision. FLUOROSCOPY TIME:  4 minutes 12 seconds (681 mGy) COMPLICATIONS: None immediate. PROCEDURE: Informed written consent was obtained from the patient's sister following explanation of the procedure, risks, benefits and alternatives. A time out was performed prior to the initiation of the procedure. Ultrasound scanning was performed to demarcate the edge of the left lobe of the liver. Maximal barrier sterile technique utilized including caps, mask, sterile gowns, sterile gloves, large sterile drape, hand hygiene and Betadine prep. The left upper quadrant was sterilely prepped and draped. An oral gastric catheter was inserted into the stomach under fluoroscopy. The existing nasogastric feeding tube was removed. The left costal margin and air opacified transverse colon were identified and avoided. Air was injected into the stomach for insufflation and visualization under fluoroscopy. Under sterile conditions a 17 gauge trocar needle was utilized to access the stomach percutaneously beneath the left subcostal margin after the overlying soft tissues were anesthetized with 1% Lidocaine with epinephrine. Needle position was confirmed within the stomach with aspiration of air and injection of small amount of contrast. A single T tack was deployed for gastropexy. Over an Amplatz guide wire, a 9-French sheath was inserted into the  stomach. A snare device was utilized to capture the oral gastric catheter. The snare device was pulled retrograde from the stomach up the esophagus and out the oropharynx. The 20-French pull-through gastrostomy was connected to the snare device and pulled antegrade through the oropharynx down the esophagus into the stomach and then through the percutaneous tract external to the patient. The gastrostomy was assembled externally. Contrast injection confirms position in the stomach. Several spot radiographic images were obtained in various obliquities for documentation. The patient tolerated procedure well without immediate post procedural complication. FINDINGS: After successful fluoroscopic guided placement, the gastrostomy tube is appropriately positioned with internal disc against the ventral aspect of the gastric lumen. IMPRESSION: Successful fluoroscopic insertion of a 20-French pull-through gastrostomy tube. The gastrostomy may be used immediately for medication administration and in 24 hrs for the initiation of feeds. Electronically Signed   By: Sandi Mariscal M.D.   On: 10/13/2016 15:02   Dg Chest Port 1 View  Result Date: 10/18/2016 CLINICAL DATA:  Acute respiratory acidosis EXAM: PORTABLE CHEST 1 VIEW COMPARISON:  10/18/2016 FINDINGS: Replacement of tracheostomy tube with the tip  3.6 cm superior to the carina. A left upper extremity catheter tip overlies the SVC. Suspect small left-sided pleural effusion. Moderate cardiomegaly with mild central congestion. Hazy bibasilar atelectasis or infiltrate. No pneumothorax. IMPRESSION: 1. Small left-sided pleural effusion with hazy bibasilar atelectasis or infiltrate unchanged 2. Stable moderate cardiomegaly with mild central congestion Electronically Signed   By: Donavan Foil M.D.   On: 10/18/2016 18:53   Dg Chest Port 1 View  Result Date: 10/18/2016 CLINICAL DATA:  Respiratory failure EXAM: PORTABLE CHEST 1 VIEW COMPARISON:  10/11/2016 FINDINGS: Tracheostomy  tube remains in place. Esophageal tube has removed. Left upper extremity catheter tip overlies the SVC. There are low lung volumes. Slightly improved aeration of the right lung base. There are hazy bibasilar opacities, atelectasis versus mild infiltrates. There is moderate cardiomegaly, similar compared to prior. Decreased central congestion. No pneumothorax. IMPRESSION: 1. Slightly improved aeration of the right lung base. 2. Hazy bibasilar atelectasis versus mild infiltrates 3. Stable cardiomegaly Electronically Signed   By: Donavan Foil M.D.   On: 10/18/2016 15:09   Dg Chest Port 1 View  Result Date: 10/11/2016 CLINICAL DATA:  75 year old female with shortness of breath. Respiratory failure. Tracheostomy. Initial encounter. EXAM: PORTABLE CHEST 1 VIEW COMPARISON:  10/10/2016 and earlier. FINDINGS: Portable AP semi upright view at 1146 hours. Tracheostomy tube appears stable. A feeding tube courses to the abdomen, tip not included. Lung volumes are stable. Mild elevation of the left hemidiaphragm is stable. Stable cardiac size and mediastinal contours. Calcified aortic atherosclerosis. No pneumothorax, pulmonary edema or consolidation. Mild veiling opacity at the right lung base such that a small right pleural effusion is difficult to exclude. No acute osseous abnormality identified. IMPRESSION: 1.  Stable lines and tubes. 2. Stable ventilation with no focal pulmonary abnormality aside from a possible small right pleural effusion. Electronically Signed   By: Genevie Ann M.D.   On: 10/11/2016 11:57   Dg Chest Port 1 View  Result Date: 10/10/2016 CLINICAL DATA:  Acute respiratory failure with hypoxemia EXAM: PORTABLE CHEST 1 VIEW COMPARISON:  Yesterday FINDINGS: Nasogastric in place of feeding tube, at least reaches the stomach. Left upper extremity PICC with tip at the SVC level. Tracheostomy tube remains seated. COPD with chronic hyperinflation and apical lucency. Improving basilar aeration with mild  residual pneumonia or atelectasis. Chronic cardiopericardial enlargement. No edema, effusion, or pneumothorax. IMPRESSION: 1. Unremarkable positioning of tubes and central line. 2. Improving basilar aeration. Electronically Signed   By: Monte Fantasia M.D.   On: 10/10/2016 07:13   Dg Chest Port 1 View  Result Date: 10/09/2016 CLINICAL DATA:  Respiratory failure EXAM: PORTABLE CHEST 1 VIEW COMPARISON:  October 08, 2016 FINDINGS: The feeding tube terminates below today's film. Other support apparatus stable. No pneumothorax. Stable cardiomegaly. Support apparatus obscures the right base. Probable mild venous congestion with no definitive focal infiltrate. IMPRESSION: Stable support apparatus. Probable mild pulmonary venous congestion. Electronically Signed   By: Dorise Bullion III M.D   On: 10/09/2016 07:16   Dg Chest Port 1 View  Result Date: 10/08/2016 CLINICAL DATA:  ETT.  Respiratory failure. EXAM: PORTABLE CHEST 1 VIEW COMPARISON:  October 07, 2016 FINDINGS: A tracheostomy tube is again identified. A feeding tube terminates below today's film. No pneumothorax. Probable layering effusion on the right. No overt edema. Left basilar atelectasis. No other interval changes. IMPRESSION: Stable tracheostomy. Opacity in the right base is mildly more prominent, favored to represent layering effusion and underlying atelectasis. Recommend attention on follow-up. Mild atelectasis in the  left base. Stable left PICC line. Electronically Signed   By: Dorise Bullion III M.D   On: 10/08/2016 08:10   Dg Chest Port 1 View  Result Date: 10/07/2016 CLINICAL DATA:  Evaluate tracheostomy. EXAM: PORTABLE CHEST 1 VIEW COMPARISON:  10/06/2016 FINDINGS: A tracheostomy replaces the endotracheal tube. The tracheostomy tube tip projects in the upper thoracic trachea, well positioned. The nasogastric tube has been removed.  Left PICC is stable. There is opacity at the lung bases, right greater than left, consistent with a  combination of pleural fluid and atelectasis. There is persistent pulmonary vascular congestion with no overt pulmonary edema. No pneumothorax. Cardiac silhouette is mildly enlarged but stable. IMPRESSION: 1. Well-positioned tracheostomy tube replacing the endotracheal tube. 2. Increased opacity at the right lung base likely due to an increase in pleural fluid. 3. No other change. Vascular congestion with no overt pulmonary edema. Mild left lung base atelectasis and probable small effusion. Electronically Signed   By: Lajean Manes M.D.   On: 10/07/2016 13:30   Dg Chest Port 1 View  Result Date: 10/06/2016 CLINICAL DATA:  Respiratory failure.  COPD. EXAM: PORTABLE CHEST 1 VIEW COMPARISON:  10/05/2016 FINDINGS: Endotracheal tube remains in place with tip approximately 2 cm above the carina. Enteric tube courses into the left upper abdomen with tip not imaged. The cardiac silhouette remains enlarged. Aortic atherosclerosis is noted. Lung volumes are minimally increased compared yesterday. Patchy bibasilar opacities have not significantly changed. No large pleural effusion or pneumothorax is identified. Mild central pulmonary vascular congestion is unchanged. IMPRESSION: 1. No significant interval change. Cardiomegaly, pulmonary vascular congestion, and patchy bibasilar opacities which may reflect atelectasis. 2. Aortic atherosclerosis. Electronically Signed   By: Logan Bores M.D.   On: 10/06/2016 08:05   Dg Chest Port 1 View  Result Date: 10/05/2016 CLINICAL DATA:  Respiratory failure. EXAM: PORTABLE CHEST 1 VIEW COMPARISON:  10/04/2016 FINDINGS: Endotracheal tube is 2.4 cm above the carina. PICC line tip in the SVC region. Heart remains enlarged. Patchy densities at both lung bases. Negative for pneumothorax. Again noted is slight enlargement of central vascular structures. IMPRESSION: Endotracheal tube is appropriately positioned. Central vascular congestion with patchy basilar chest densities. Basilar  chest densities may represent a combination of atelectasis and small effusions. Electronically Signed   By: Markus Daft M.D.   On: 10/05/2016 08:57   Dg Chest Port 1 View  Result Date: 10/04/2016 CLINICAL DATA:  Intubated EXAM: PORTABLE CHEST 1 VIEW COMPARISON:  Chest radiograph from one day prior. FINDINGS: Endotracheal tube tip is 5.5 cm above the carina. Enteric tube enters stomach with the tip not seen on this image. Left PICC terminates in the lower third of the superior vena cava. Stable cardiomediastinal silhouette with mild cardiomegaly and aortic atherosclerosis. No pneumothorax. Probable trace bilateral pleural effusions. Mild pulmonary edema appears stable. IMPRESSION: 1. Support structures as described. 2. Stable mild congestive heart failure . 3. Probable trace bilateral pleural effusions. 4. Aortic atherosclerosis. Electronically Signed   By: Ilona Sorrel M.D.   On: 10/04/2016 09:21   Dg Chest Port 1 View  Result Date: 10/03/2016 CLINICAL DATA:  Intubation/vent EXAM: PORTABLE CHEST - 1 VIEW COMPARISON:  10/02/2016 FINDINGS: Endotracheal tube and left PICC remain in place. Nasogastric tube has been advanced at least as far as the stomach, tip not seen. Relatively low lung volumes with patchy bibasilar atelectasis or infiltrates slightly increased. Heart size upper limits normal for technique. Atheromatous aorta. Blunting of left lateral costophrenic angle suggesting small effusion. Visualized  bones unremarkable. IMPRESSION: 1. Slight worsening of bibasilar infiltrates or atelectasis. 2.   nasogastric tube to the stomach. Electronically Signed   By: Lucrezia Europe M.D.   On: 10/03/2016 07:37   Portable Chest Xray  Result Date: 10/02/2016 CLINICAL DATA:  Endotracheal tube placement EXAM: PORTABLE CHEST 1 VIEW COMPARISON:  12/02/2015 FINDINGS: Cardiac shadow remains enlarged. An endotracheal tube is again noted approximately 2.8 cm above the carina in satisfactory position. The nasogastric catheter  has been removed in the interval. A new left-sided PICC line is noted with the tip at cavoatrial junction. The lungs show some patchy atelectatic changes in the bases bilaterally IMPRESSION: Bibasilar atelectasis. Tubes and lines as described. Electronically Signed   By: Inez Catalina M.D.   On: 10/02/2016 15:17   Dg Chest Port 1 View  Result Date: 10/01/2016 CLINICAL DATA:  Respiratory failure EXAM: PORTABLE CHEST 1 VIEW COMPARISON:  09/30/2016 FINDINGS: Cardiac shadow is enlarged. Endotracheal tube and nasogastric catheter are noted. The endotracheal tube has been advanced somewhat and now lies approximately 1.7 cm above the carina. The lungs are well aerated bilaterally with mild patchy they basilar atelectasis. No new focal infiltrate is seen. No bony abnormality is noted. IMPRESSION: Mild bibasilar atelectasis new from the prior exam. No other focal abnormality is seen. Electronically Signed   By: Inez Catalina M.D.   On: 10/01/2016 07:21   Dg Chest Portable 1 View  Result Date: 09/30/2016 CLINICAL DATA:  Hypoxia with endotracheal tube adjustment EXAM: PORTABLE CHEST 1 VIEW COMPARISON:  Study obtained earlier in the day FINDINGS: Endotracheal tube tip is now 3.6 cm above the carina. No pneumothorax. There is airspace consolidation in the left base laterally. Lungs elsewhere are clear. Heart is mildly enlarged with pulmonary vascularity within normal limits. No adenopathy. There is atherosclerotic calcification in the aorta. No evident bone lesions. IMPRESSION: Endotracheal tube as described without pneumothorax. Left base airspace consolidation consistent with pneumonia. Lungs elsewhere clear. Stable cardiac silhouette. There is aortic atherosclerosis. Electronically Signed   By: Lowella Grip III M.D.   On: 09/30/2016 09:36   Dg Chest Portable 1 View  Result Date: 09/30/2016 CLINICAL DATA:  75 year old female found unresponsive this morning. Intubated. Initial encounter. EXAM: PORTABLE CHEST 1  VIEW COMPARISON:  09/16/2015 and earlier. FINDINGS: Portable AP semi upright view at 0915 hours. Endotracheal tube tip in good position about 2.5 cm above the carina. Enteric tube placed, tip not definitely included. Somewhat low lung volumes. Mild patchy opacity at the left lung base. Stable cardiomegaly and mediastinal contours. Calcified aortic atherosclerosis. No pneumothorax, pulmonary edema, or definite pleural effusion. IMPRESSION: 1. ET tube in good position. Enteric tube courses to the left abdomen. 2. Lower lung volumes. Patchy left lung base opacity could reflect atelectasis, but consider also aspiration or bronchopneumonia in this setting. 3.  Calcified aortic atherosclerosis. Electronically Signed   By: Genevie Ann M.D.   On: 09/30/2016 09:34   Dg Abd Portable 1v  Result Date: 10/10/2016 CLINICAL DATA:  Feeding tube placement EXAM: PORTABLE ABDOMEN - 1 VIEW COMPARISON:  Abdominal radiograph 10/08/2016 FINDINGS: The enteric tube courses beyond the field of view in the tip is not visualized adequately. There is bibasilar atelectasis. IMPRESSION: Inadequate visualization of enteric tube tip, which is beyond the field of view. Electronically Signed   By: Ulyses Jarred M.D.   On: 10/10/2016 16:09   Dg Abd Portable 1v  Result Date: 10/08/2016 CLINICAL DATA:  Evaluate enteric tube placement. EXAM: PORTABLE ABDOMEN - 1 VIEW COMPARISON:  Common radiograph 10/07/2016. FINDINGS: Enteric tube tip projects over the gastric antrum. Unremarkable bowel gas pattern. Lumbar spine degenerative changes. IMPRESSION: Enteric tube tip projects over the gastric antrum. Electronically Signed   By: Lovey Newcomer M.D.   On: 10/08/2016 09:59   Dg Abd Portable 1v  Result Date: 10/02/2016 CLINICAL DATA:  Feeding tube placement. EXAM: PORTABLE ABDOMEN - 1 VIEW COMPARISON:  04/07/2015 FINDINGS: Nasogastric terminates in the right-sided side of the abdomen, either within the distal stomach or proximal duodenum. Nonobstructive  bowel gas pattern. Cardiomegaly. IMPRESSION: Nasogastric terminating at the distal stomach or proximal duodenum. Electronically Signed   By: Abigail Miyamoto M.D.   On: 10/02/2016 16:30   Ct Angio Chest/abd/pel For Dissection W And/or Wo Contrast  Result Date: 09/30/2016 CLINICAL DATA:  Respiratory distress, right-sided weakness EXAM: CT ANGIOGRAPHY CHEST, ABDOMEN AND PELVIS TECHNIQUE: Multidetector CT imaging through the chest, abdomen and pelvis was performed using the standard protocol during bolus administration of intravenous contrast. Multiplanar reconstructed images and MIPs were obtained and reviewed to evaluate the vascular anatomy. CONTRAST:  100 mL Isovue 370 COMPARISON:  None. FINDINGS: CTA CHEST FINDINGS Cardiovascular: Preferential opacification of the thoracic aorta. No evidence of thoracic aortic aneurysm or dissection. Thoracic aortic atherosclerosis major vessels arising from the aortic arch are patent. Bilateral vertebral arteries are patent. Dense coronary artery atherosclerosis in the LAD, left main, and circumflex. Normal heart size. No pericardial effusion. Mediastinum/Nodes: No enlarged mediastinal, hilar, or axillary lymph nodes. Thyroid gland, trachea, and esophagus demonstrate no significant findings. Lungs/Pleura: Bibasilar atelectasis. Lingular airspace disease which may reflect atelectasis versus pneumonia. No pleural effusion or pneumothorax. Musculoskeletal: No acute osseous abnormality. Degenerative disc disease throughout the thoracic spine. No aggressive lytic or sclerotic osseous lesion. Degenerative disc disease with disc space narrowing of the lower cervical spine. Review of the MIP images confirms the above findings. CTA ABDOMEN AND PELVIS FINDINGS VASCULAR Aorta: Normal caliber aorta without aneurysm, dissection, vasculitis or significant stenosis. Abdominal aortic atherosclerosis. Celiac: Patent without evidence of aneurysm, dissection, vasculitis or significant stenosis.  Mild atherosclerotic plaque at the origin. SMA: Patent without evidence of aneurysm, dissection, vasculitis or significant stenosis. Mild atherosclerosis at the origin and slightly more distally. Renals: Both renal arteries are patent without evidence of aneurysm, dissection, vasculitis, fibromuscular dysplasia or significant stenosis. IMA: Patent without evidence of aneurysm, dissection, vasculitis or significant stenosis. Veins: No obvious venous abnormality within the limitations of this arterial phase study. Iliacs: Atherosclerotic disease involving bilateral common iliac arteries extending to the external iliac arteries. 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 Review of the MIP images confirms the above findings. NON-VASCULAR Hepatobiliary: 10 mm hypodensity in the right hepatic lobe of indeterminate etiology likely representing a small cyst unchanged from 05/20/2014. Liver is otherwise unremarkable. No gallstones, gallbladder wall thickening, or biliary dilatation. Pancreas: Unremarkable. No pancreatic ductal dilatation or surrounding inflammatory changes. Spleen: Normal in size without focal abnormality. Adrenals/Urinary Tract: Adrenal glands are unremarkable. Kidneys are normal, without renal calculi, focal lesion, or hydronephrosis. Bladder is decompressed with a Foley catheter present. Stomach/Bowel: Stomach is within normal limits. Sigmoid diverticulosis without evidence of diverticulitis. Appendix appears normal. No evidence of bowel wall thickening, distention, or inflammatory changes. Lymphatic:  No lymphandeopathy. Reproductive: Status post hysterectomy. No adnexal masses. Other: Fat containing umbilical hernia.  No abdominopelvic ascites. Musculoskeletal: Nondisplaced fracture  of the right  superior and inferior pubic rami. 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 Review of the MIP images confirms the above findings. IMPRESSION: 1. No aortic aneurysm or dissection. 2. Diverticulosis without evidence diverticulitis. 3. Nondisplaced fracture of the right superior and inferior pubic rami. 4. 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 5.  Aortic Atherosclerosis (ICD10-170.0) 6. Severe coronary artery atherosclerosis. 7. Lingular airspace disease which may reflect atelectasis versus pneumonia. Electronically Signed   By: Kathreen Devoid   On: 09/30/2016 12:06    Microbiology: Recent Results (from the past 240 hour(s))  Aerobic Culture (superficial specimen)     Status: None (Preliminary result)   Collection Time: 10/18/16  6:30 PM  Result Value Ref Range Status   Specimen Description WOUND  Final   Special Requests [facility] ULCER DISCHARGE  Final   Gram Stain   Final    FEW WBC PRESENT,BOTH PMN AND MONONUCLEAR RARE GRAM NEGATIVE RODS    Culture TOO YOUNG TO READ  Final   Report Status PENDING  Incomplete     Labs: Basic Metabolic Panel:  Recent Labs Lab 10/14/16 0343 10/15/16 0353 10/16/16 0310 10/17/16 0445 10/18/16 0315  NA 139 137 138 140 141  K 3.4* 4.7 4.4 4.5 4.9  CL 100* 103 103 105 106  CO2 29 26 27 27 27   GLUCOSE 120* 156* 174* 108* 144*  BUN 42* 18 46* 45* 47*  CREATININE 1.36* 2.24* 1.36* 1.33* 1.31*  CALCIUM 9.2 9.2 9.3 9.3 9.6  MG 2.1 2.6* 2.3  --   --    Liver Function Tests:  Recent Labs Lab 10/18/16 0315  AST 34  ALT 30  ALKPHOS 111  BILITOT 0.4  PROT 5.9*  ALBUMIN 1.9*   No results for input(s): LIPASE, AMYLASE in the last 168 hours. No results for input(s): AMMONIA in the last 168  hours. CBC:  Recent Labs Lab 10/14/16 0343 10/15/16 0353 10/16/16 0310 10/18/16 0315 10/18/16 0834 10/18/16 1956 10/19/16 0748  WBC 12.6* 8.2 10.4 7.1  --   --  6.4  HGB 8.1* 7.1* 7.8* 6.6* 7.0* 8.3* 12.7  HCT 26.7* 22.6* 25.1* 22.1* 23.1* 26.9* 40.9  MCV 98.2 86.9 98.0 99.1  --   --  95.8  PLT 264 153 256 276  --   --  223   Cardiac Enzymes: No results for input(s): CKTOTAL, CKMB, CKMBINDEX, TROPONINI in the last 168 hours. BNP: BNP (last 3 results)  Recent Labs  09/30/16 0953  BNP 737.8*    ProBNP (last 3 results) No results for input(s): PROBNP in the last 8760 hours.  CBG:  Recent Labs Lab 10/19/16 2035 10/20/16 0008 10/20/16 0354 10/20/16 0835 10/20/16 1243  GLUCAP 206* 200* 261* 242* 152*       Signed:  Dia Crawford, MD [facility] [phone] pager

## 2016-10-20 NOTE — Care Management Note (Addendum)
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 Left message with [facility] Liaison, Corina. Spoke to [employer], [employer] and they can accept pt today. Will follow up with attending to get dc summary and [place] completed.     Expected Discharge Date:   10/20/2016              Expected Discharge Plan:  Long Term Acute Care (LTAC)  In-House Referral:  NA  Discharge planning Services  CM Consult  Post Acute Care Choice:  Long Term Acute Care (LTAC) Choice offered to:  Patient  DME Arranged:  N/A DME Agency:  NA  HH Arranged:  NA HH Agency:  NA  Status of Service:  Completed, signed off  If discussed at [place] of Stay Meetings, dates discussed:    Additional Comments:  Erenest Rasher, RN 10/20/2016, 11:14 AM

## 2016-10-20 NOTE — Clinical Social Work Note (Signed)
CSW received another call from RN stating that she called the on-call number for CM and that the [facility] is at [place]. CSW explained that CSW cannot assist with LTACH placement as this is facilitated by care management. CSW explained that the number provided is listed in amion. CSW signing off.   [name] MSW, [place], [place], [id number]

## 2016-10-20 NOTE — Clinical Social Work Note (Signed)
CSW received call from RN stating [facility] called with approval for LTACH. CSW provided RN with on call CM number. CSW signing off.   Liz Beach MSW, [place], [place], [id number]

## 2016-10-21 LAB — CBC WITH DIFFERENTIAL/PLATELET
Basophils Absolute: 0 10*3/uL (ref 0.0–0.1)
Basophils Relative: 0 %
EOS ABS: 0 10*3/uL (ref 0.0–0.7)
EOS PCT: 0 %
HCT: 27.1 % — ABNORMAL LOW (ref 36.0–46.0)
Hemoglobin: 8.4 g/dL — ABNORMAL LOW (ref 12.0–15.0)
LYMPHS ABS: 0.7 10*3/uL (ref 0.7–4.0)
Lymphocytes Relative: 8 %
MCH: 29.9 pg (ref 26.0–34.0)
MCHC: 31 g/dL (ref 30.0–36.0)
MCV: 96.4 fL (ref 78.0–100.0)
MONO ABS: 0.2 10*3/uL (ref 0.1–1.0)
Monocytes Relative: 2 %
NEUTROS PCT: 90 %
Neutro Abs: 8 10*3/uL — ABNORMAL HIGH (ref 1.7–7.7)
PLATELETS: 365 10*3/uL (ref 150–400)
RBC: 2.81 MIL/uL — AB (ref 3.87–5.11)
RDW: 16 % — AB (ref 11.5–15.5)
WBC: 8.9 10*3/uL (ref 4.0–10.5)

## 2016-10-21 LAB — BASIC METABOLIC PANEL
ANION GAP: 10 (ref 5–15)
BUN: 74 mg/dL — ABNORMAL HIGH (ref 6–20)
CHLORIDE: 104 mmol/L (ref 101–111)
CO2: 26 mmol/L (ref 22–32)
Calcium: 9.4 mg/dL (ref 8.9–10.3)
Creatinine, Ser: 1.64 mg/dL — ABNORMAL HIGH (ref 0.44–1.00)
GFR calc non Af Amer: 29 mL/min — ABNORMAL LOW (ref >60)
GFR, EST AFRICAN AMERICAN: 34 mL/min — AB (ref >60)
GLUCOSE: 265 mg/dL — AB (ref 65–99)
Potassium: 5.4 mmol/L — ABNORMAL HIGH (ref 3.5–5.1)
Sodium: 140 mmol/L (ref 135–145)

## 2016-10-21 LAB — COMPREHENSIVE METABOLIC PANEL
ALK PHOS: 112 U/L (ref 38–126)
ALT: 38 U/L (ref 14–54)
AST: 33 U/L (ref 15–41)
Albumin: 2.4 g/dL — ABNORMAL LOW (ref 3.5–5.0)
Anion gap: 11 (ref 5–15)
BILIRUBIN TOTAL: 0.3 mg/dL (ref 0.3–1.2)
BUN: 71 mg/dL — AB (ref 6–20)
CALCIUM: 9.8 mg/dL (ref 8.9–10.3)
CHLORIDE: 103 mmol/L (ref 101–111)
CO2: 24 mmol/L (ref 22–32)
CREATININE: 1.59 mg/dL — AB (ref 0.44–1.00)
GFR, EST AFRICAN AMERICAN: 35 mL/min — AB (ref >60)
GFR, EST NON AFRICAN AMERICAN: 30 mL/min — AB (ref >60)
Glucose, Bld: 237 mg/dL — ABNORMAL HIGH (ref 65–99)
Potassium: 6.3 mmol/L (ref 3.5–5.1)
Sodium: 138 mmol/L (ref 135–145)
Total Protein: 6.3 g/dL — ABNORMAL LOW (ref 6.5–8.1)

## 2016-10-21 LAB — TSH: TSH: 0.83 u[IU]/mL (ref 0.350–4.500)

## 2016-10-21 LAB — PHOSPHORUS: Phosphorus: 4.7 mg/dL — ABNORMAL HIGH (ref 2.5–4.6)

## 2016-10-21 LAB — PROTIME-INR
INR: 1.13
PROTHROMBIN TIME: 14.5 s (ref 11.4–15.2)

## 2016-10-21 LAB — MAGNESIUM: MAGNESIUM: 2.4 mg/dL (ref 1.7–2.4)

## 2016-10-22 LAB — BASIC METABOLIC PANEL
ANION GAP: 9 (ref 5–15)
BUN: 70 mg/dL — ABNORMAL HIGH (ref 6–20)
CALCIUM: 9.5 mg/dL (ref 8.9–10.3)
CO2: 29 mmol/L (ref 22–32)
Chloride: 105 mmol/L (ref 101–111)
Creatinine, Ser: 1.57 mg/dL — ABNORMAL HIGH (ref 0.44–1.00)
GFR calc non Af Amer: 31 mL/min — ABNORMAL LOW (ref >60)
GFR, EST AFRICAN AMERICAN: 36 mL/min — AB (ref >60)
GLUCOSE: 220 mg/dL — AB (ref 65–99)
POTASSIUM: 4.5 mmol/L (ref 3.5–5.1)
Sodium: 143 mmol/L (ref 135–145)

## 2016-10-22 LAB — HEMOGLOBIN A1C
HEMOGLOBIN A1C: 6.5 % — AB (ref 4.8–5.6)
Mean Plasma Glucose: 140 mg/dL

## 2016-10-24 DIAGNOSIS — Z7189 Other specified counseling: Secondary | ICD-10-CM

## 2016-10-27 LAB — CBC
HEMATOCRIT: 39 % (ref 36.0–46.0)
Hemoglobin: 11.6 g/dL — ABNORMAL LOW (ref 12.0–15.0)
MCH: 29.4 pg (ref 26.0–34.0)
MCHC: 29.7 g/dL — AB (ref 30.0–36.0)
MCV: 98.7 fL (ref 78.0–100.0)
Platelets: 349 10*3/uL (ref 150–400)
RBC: 3.95 MIL/uL (ref 3.87–5.11)
RDW: 15.6 % — AB (ref 11.5–15.5)
WBC: 11.4 10*3/uL — ABNORMAL HIGH (ref 4.0–10.5)

## 2016-10-27 LAB — BASIC METABOLIC PANEL
Anion gap: 8 (ref 5–15)
BUN: 94 mg/dL — ABNORMAL HIGH (ref 6–20)
CALCIUM: 10.4 mg/dL — AB (ref 8.9–10.3)
CO2: 29 mmol/L (ref 22–32)
CREATININE: 1.5 mg/dL — AB (ref 0.44–1.00)
Chloride: 107 mmol/L (ref 101–111)
GFR calc non Af Amer: 33 mL/min — ABNORMAL LOW (ref >60)
GFR, EST AFRICAN AMERICAN: 38 mL/min — AB (ref >60)
Glucose, Bld: 303 mg/dL — ABNORMAL HIGH (ref 65–99)
Potassium: 5.3 mmol/L — ABNORMAL HIGH (ref 3.5–5.1)
SODIUM: 144 mmol/L (ref 135–145)

## 2016-10-28 LAB — POTASSIUM: Potassium: 5.6 mmol/L — ABNORMAL HIGH (ref 3.5–5.1)

## 2016-10-29 LAB — POTASSIUM: Potassium: 4.4 mmol/L (ref 3.5–5.1)

## 2016-10-31 ENCOUNTER — Other Ambulatory Visit (HOSPITAL_COMMUNITY): Payer: Medicare Other

## 2016-10-31 LAB — URINE MICROSCOPIC-ADD ON

## 2016-10-31 LAB — CBC
HEMATOCRIT: 43.4 % (ref 36.0–46.0)
Hemoglobin: 12.8 g/dL (ref 12.0–15.0)
MCH: 29.6 pg (ref 26.0–34.0)
MCHC: 29.5 g/dL — AB (ref 30.0–36.0)
MCV: 100.2 fL — AB (ref 78.0–100.0)
Platelets: 231 10*3/uL (ref 150–400)
RBC: 4.33 MIL/uL (ref 3.87–5.11)
RDW: 16 % — AB (ref 11.5–15.5)
WBC: 15.2 10*3/uL — ABNORMAL HIGH (ref 4.0–10.5)

## 2016-10-31 LAB — BASIC METABOLIC PANEL WITH GFR
Anion gap: 12 (ref 5–15)
BUN: 128 mg/dL — ABNORMAL HIGH (ref 6–20)
CO2: 28 mmol/L (ref 22–32)
Calcium: 10.2 mg/dL (ref 8.9–10.3)
Chloride: 110 mmol/L (ref 101–111)
Creatinine, Ser: 2.16 mg/dL — ABNORMAL HIGH (ref 0.44–1.00)
GFR calc Af Amer: 24 mL/min — ABNORMAL LOW
GFR calc non Af Amer: 21 mL/min — ABNORMAL LOW
Glucose, Bld: 266 mg/dL — ABNORMAL HIGH (ref 65–99)
Potassium: 5.4 mmol/L — ABNORMAL HIGH (ref 3.5–5.1)
Sodium: 150 mmol/L — ABNORMAL HIGH (ref 135–145)

## 2016-10-31 LAB — URINALYSIS, ROUTINE W REFLEX MICROSCOPIC
BILIRUBIN URINE: NEGATIVE
GLUCOSE, UA: NEGATIVE mg/dL
KETONES UR: NEGATIVE mg/dL
Nitrite: NEGATIVE
PH: 8.5 — AB (ref 5.0–8.0)
Protein, ur: 100 mg/dL — AB
Specific Gravity, Urine: 1.016 (ref 1.005–1.030)

## 2016-11-01 LAB — BASIC METABOLIC PANEL
Anion gap: 12 (ref 5–15)
BUN: 135 mg/dL — ABNORMAL HIGH (ref 6–20)
CALCIUM: 10 mg/dL (ref 8.9–10.3)
CO2: 31 mmol/L (ref 22–32)
CREATININE: 2.53 mg/dL — AB (ref 0.44–1.00)
Chloride: 110 mmol/L (ref 101–111)
GFR, EST AFRICAN AMERICAN: 20 mL/min — AB (ref >60)
GFR, EST NON AFRICAN AMERICAN: 17 mL/min — AB (ref >60)
Glucose, Bld: 249 mg/dL — ABNORMAL HIGH (ref 65–99)
Potassium: 4.1 mmol/L (ref 3.5–5.1)
SODIUM: 153 mmol/L — AB (ref 135–145)

## 2016-11-01 LAB — PROCALCITONIN: PROCALCITONIN: 0.4 ng/mL

## 2016-11-02 LAB — URINE CULTURE

## 2016-11-02 LAB — BASIC METABOLIC PANEL
ANION GAP: 10 (ref 5–15)
BUN: 138 mg/dL — AB (ref 6–20)
CALCIUM: 9.3 mg/dL (ref 8.9–10.3)
CO2: 29 mmol/L (ref 22–32)
Chloride: 105 mmol/L (ref 101–111)
Creatinine, Ser: 2.36 mg/dL — ABNORMAL HIGH (ref 0.44–1.00)
GFR calc Af Amer: 22 mL/min — ABNORMAL LOW (ref >60)
GFR, EST NON AFRICAN AMERICAN: 19 mL/min — AB (ref >60)
GLUCOSE: 323 mg/dL — AB (ref 65–99)
POTASSIUM: 4.4 mmol/L (ref 3.5–5.1)
SODIUM: 144 mmol/L (ref 135–145)

## 2016-11-02 NOTE — Consult Note (Signed)
Date: 11/02/2016                  Patient Name:  [name]  MRN: [id number]  DOB: February 26, 1941  Age / Sex: 75 y.o., female         PCP: Leola Brazil, MD                 Service Requesting Consult: [facility] medicine, [facility]                 Reason for Consult: ARF            History of Present Illness: Patient is a 75 y.o. female with medical problems of coronary artery disease, multiple stents, arrhythmias,hypertension, peripheral vascular disease, pericardial effusion, COPD, cirrhosis of liver, nonalcoholic, chronic back pain, type 2 diabetes with complications, who was admitted to [facility] from [place] on 10/20/2016 . 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 Aneurysmal dilatation of the left common iliac artery. Aortic atherosclerosis and severe coronary artery atherosclerosis. Nondisplaced fracture of right superior and inferior pubic rami. 2-D echo showed EF of 40-45% with grade 1 diastolic dysfunction.  Nephrology consult was requested for evaluation of acute in her baseline creatinine appears to be 1.31/45/BUN 47 noted on November 21. Since then, her creatinine and  BUN have been increasing. Today's levels are creatinine up to 2.36, BUN of 138  Patient follows only very limited commands. She is unable to provide meaningful detailed history. All information is obtained from the chart.    Medications: Outpatient medications: Prescriptions Prior to Admission  Medication Sig Dispense Refill Last Dose  . acetaminophen (TYLENOL) 160 MG/5ML solution Place 20.3 mLs (650 mg total) into feeding tube every 6 (six) hours as needed for mild pain, headache or fever. 120 mL 0   . ALPRAZolam (XANAX)  0.25 MG tablet Place 1 tablet (0.25 mg total) into feeding tube 3 (three) times daily as needed for anxiety. 15 tablet 0   . Amino Acids-Protein Hydrolys (FEEDING SUPPLEMENT, PRO-STAT SUGAR FREE 64,) LIQD Place 30 mLs into feeding tube daily. 900 mL 0   . arformoterol (BROVANA) 15 MCG/2ML NEBU Take 2 mLs (15 mcg total) by nebulization 2 (two) times daily. 120 mL 0   . artificial tears (LACRILUBE) OINT ophthalmic ointment Place 1 application into both eyes 2 (two) times daily. 3.5 g 0   . bisacodyl (DULCOLAX) 10 MG suppository Place 1 suppository (10 mg total) rectally daily as needed for moderate constipation. 12 suppository 0   . budesonide (PULMICORT) 0.5 MG/2ML nebulizer solution Take 2 mLs (0.5 mg total) by nebulization 2 (two) times daily. 2 mL 0   . carvedilol (COREG) 12.5 MG tablet Place 1 tablet (12.5 mg total) into feeding tube 2 (two) times daily with a meal. 10 tablet 0   . chlorhexidine gluconate, MEDLINE KIT, (PERIDEX) 0.12 % solution 15 mLs by Mouth Rinse route 2 (two) times daily. 120 mL 0   . collagenase (SANTYL) ointment Apply topically daily. 15 g 0   . docusate (COLACE) 50 MG/5ML liquid Place 10 mLs (100 mg total) into feeding tube 2 (two) times daily. 100 mL 0   . dorzolamide (TRUSOPT) 2 % ophthalmic solution Place 1 drop into the right eye 2 (two) times daily. 10  mL 12   . ezetimibe (ZETIA) 10 MG tablet Place 1 tablet (10 mg total) into feeding tube daily. 5 tablet 0   . famotidine (PEPCID) 40 MG/5ML suspension Place 2.5 mLs (20 mg total) into feeding tube daily. 50 mL 0   . fentaNYL (SUBLIMAZE) 100 MCG/2ML injection Inject 1 mL (50 mcg total) into the vein every hour as needed for severe pain. 2 mL 0   . heparin 5000 UNIT/ML injection Inject 1 mL (5,000 Units total) into the skin every 8 (eight) hours. 1 mL 0   . hydrALAZINE (APRESOLINE) 100 MG tablet Place 1 tablet (100 mg total) into feeding tube every 8 (eight) hours. 15 tablet 0   . hydrALAZINE (APRESOLINE) 20 MG/ML  injection Inject 0.5-2 mLs (10-40 mg total) into the vein every 4 (four) hours as needed (SBP > 160). 1 mL 0   . insulin aspart (NOVOLOG) 100 UNIT/ML injection Inject 0-20 Units into the skin every 4 (four) hours. 10 mL 11   . insulin aspart (NOVOLOG) 100 UNIT/ML injection Inject 6 Units into the skin every 4 (four) hours. 10 mL 0   . insulin glargine (LANTUS) 100 UNIT/ML injection Inject 0.25 mLs (25 Units total) into the skin daily. 10 mL 0   . ipratropium-albuterol (DUONEB) 0.5-2.5 (3) MG/3ML SOLN Take 3 mLs by nebulization every 2 (two) hours as needed. 360 mL 0   . labetalol (NORMODYNE,TRANDATE) 5 MG/ML injection Inject 2 mLs (10 mg total) into the vein every 2 (two) hours as needed (SBP > 160). 20 mL 0   . methylPREDNISolone sodium succinate (SOLU-MEDROL) 40 mg/mL injection Inject 1 mL (40 mg total) into the vein every 8 (eight) hours. 1 each 0   . mouth rinse LIQD solution 15 mLs by Mouth Rinse route QID. 44 mL 0   . Nutritional Supplements (FEEDING SUPPLEMENT, GLUCERNA 1.2 CAL,) LIQD Place 1,000 mLs into feeding tube continuous. 1500 mL 0   . polyethylene glycol (MIRALAX / GLYCOLAX) packet Place 17 g into feeding tube daily as needed for moderate constipation. 14 each 0   . traMADol (ULTRAM) 50 MG tablet Place 1 tablet (50 mg total) into feeding tube every 6 (six) hours as needed for moderate pain. 30 tablet 0     Current medications: No current facility-administered medications for this encounter.     Alprazolam 0.25 twice a day Amantadine 100 daily Condition at 0.5 mg twice a day Carvedilol 12.5 mg twice a day Rocephin 1000 mg IV daily Clonidine 0.1 mg every morning Colace Famotidine Functional Heparin subcutaneous Hydralazine 100 mg 3 times a day Ipratropium albuterol Lantus 38 units subcutaneous twice a day Melatonin 3 mg each bedtime Modafinil 100 mg daily Prednisone 15 mg daily proStat 30 mL daily Zoloft 100 mg daily Zetia 10 milligrams daily  Allergies: Allergies   Allergen Reactions  . Amlodipine Besy-Benazepril Hcl Other (See Comments)    Nervous/shakiness  . Statins Other (See Comments)    "can't take any of them; cramp me up" (12/06/2012)  . Tradjenta [Linagliptin] Other (See Comments)    Extreme muscle pains, chest and back pains  . Plavix [Clopidogrel] Rash and Other (See Comments)    Severe burning of the skin.Pt states that the doctor told her that her rash was psoriasis and not related to plavix but she states the rash came from the plavix  . Raptiva [Efalizumab] Rash      Past Medical History: Past Medical History:  Diagnosis Date  . Anemia of chronic illness 11/06/2015  .  Ankylosing spondylitis ([place])   . Anxiety   . Arthritis    "psorasic"  . Asthma   . Barrett esophagus   . Blood transfusion ~ 1962   "11 units of blood"  . Candida esophagitis ([place])   . Carotid artery disease (HCC)    dopplers showed mod L ICA stenosis   . Carpal tunnel syndrome on right   . Chronic back pain   . Chronic bronchitis ([place])    "since childhood" (12/06/2012)  . Chronic sinus infection   . Cirrhosis of liver without mention of alcohol   . COPD (chronic obstructive pulmonary disease) ([place])   . COPD (chronic obstructive pulmonary disease) ([place])   . Coronary artery disease   . Critical lower limb ischemia, with ulcer Lt foot 12/07/2012  . Dermatophytosis of nail [id number]  . Diverticulosis   . DJD (degenerative joint disease) of knee    "both" (12/06/2012)  . Dysrhythmia    "palpitations"  . Esophageal dilatation   . Exertional dyspnea   . External bleeding hemorrhoids   . GERD (gastroesophageal reflux disease)   . GI bleed   . Gout   . Heart murmur   . Hiatal hernia   . History of nuclear stress test 11/2012   lexiscan; normal, low risk   . Hypercholesteremia   . Hypertension   . Iron deficiency anemia   . Myocardial infarction   . Non-healing ulcer of foot, secondary to diabetes and PVD 12/07/2012  . OA (osteoarthritis)   . PAD  (peripheral artery disease) ([place])   . Pericardial effusion    moderate by 2-D echo without evidence of An odd  . Peripheral vascular disease ([place])   . Pneumonia 10/07/11; 2006; 1980's  . Psoriasis   . Psoriasis   . Psoriatic arthritis ([place])   . PVD (peripheral vascular disease) ([place]) 12/07/2012  . Sinus headache   . Tobacco abuse 12/07/2012  . Type II diabetes mellitus ([place])      Past Surgical History: Past Surgical History:  Procedure Laterality Date  . ABDOMINAL HYSTERECTOMY  1976  . ANGIOPLASTY  12/10/2012   "LLE"; LSFA [place] orbital rotational arthrectomy, PTA , stenting with IDEV stent (left ABI improved from 0.35 to 0.75)  . APPENDECTOMY    . ATHERECTOMY N/A 12/10/2012   Procedure: ATHERECTOMY;  Surgeon: Lorretta Harp, MD;  Location[facility] CATH LAB;  Service: Cardiovascular;  Laterality: N/A;  . BREAST LUMPECTOMY Left 10/06/2015   Procedure: LEFT BREAST LUMPECTOMY;  Surgeon: Donnie Mesa, MD;  Location: [place];  Service: General;  Laterality: Left;  . CARDIAC CATHETERIZATION N/A 09/21/2015   Procedure: Left Heart Cath and Coronary Angiography;  Surgeon: Lorretta Harp, MD;  Location: [place] CV LAB;  Service: Cardiovascular;  Laterality: N/A;  . CARPAL TUNNEL RELEASE  2011   left hand  . CATARACT EXTRACTION W/ INTRAOCULAR LENS IMPLANT  2009/2010   right/left  . COLONOSCOPY  2007   Hemorrhoids and Diverticulosis  . CORONARY ANGIOPLASTY     x2  . ENTEROSCOPY N/A 04/14/2015   Procedure: ENTEROSCOPY;  Surgeon: Irene Shipper, MD;  Location: [facility] ENDOSCOPY;  Service: Endoscopy;  Laterality: N/A;  . ESOPHAGOGASTRODUODENOSCOPY  2011  . ESOPHAGOGASTRODUODENOSCOPY N/A 03/17/2014   Procedure: ESOPHAGOGASTRODUODENOSCOPY (EGD);  Surgeon: Irene Shipper, MD;  Location: [facility] ENDOSCOPY;  Service: Endoscopy;  Laterality: N/A;  . EYE SURGERY    . IR GENERIC HISTORICAL  10/13/2016   IR GASTROSTOMY TUBE MOD SED 10/13/2016 Sandi Mariscal, MD [facility]-INTERV RAD  . LEFT  HEART CATHETERIZATION WITH  CORONARY ANGIOGRAM N/A 04/09/2014   Procedure: LEFT HEART CATHETERIZATION WITH CORONARY ANGIOGRAM;  Surgeon: Sinclair Grooms, MD;  Location: [facility] CATH LAB;  Service: Cardiovascular;  Laterality: N/A;  . LIPOMA EXCISION  2010   "left occipital area"  . LOWER EXTREMITY ANGIOGRAM N/A 12/06/2012   Procedure: LOWER EXTREMITY ANGIOGRAM;  Surgeon: Lorretta Harp, MD;  Location: [facility] CATH LAB;  Service: Cardiovascular;  Laterality: N/A;  . NASAL SINUS SURGERY  09/29/2009  . OOPHORECTOMY     'not sure which one"  . PERCUTANEOUS CORONARY ROTOBLATOR INTERVENTION (PCI-R) N/A 04/16/2014   Procedure: PERCUTANEOUS CORONARY ROTOBLATOR INTERVENTION (PCI-R);  Surgeon: Blane Ohara, MD;  Location: [facility] CATH LAB;  Service: Cardiovascular;  Laterality: N/A;  . TRANSMETATARSAL AMPUTATION Left 01/08/2013   Procedure: TRANSMETATARSAL AMPUTATION;  Surgeon: Angelia Mould, MD;  Location: [facility] OR;  Service: Vascular;  Laterality: Left;     Family History: Family History  Problem Relation Age of Onset  . Cancer Mother   . Lupus Sister     x2  . Lung cancer Sister   . Thyroid disease Sister   . Heart Problems Maternal Grandmother   . Cancer Paternal Grandfather   . Diabetes Brother     x2  . Arthritis Brother     x3  . Arthritis Sister     x3  . Colon cancer Neg Hx      Social History: Social History   Social History  . Marital status: Widowed    Spouse name: N/A  . Number of children: 0  . Years of education: N/A   Occupational History  . 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 Smoking status: Former Smoker    Packs/day: 1.00    Years: 40.00    Types: Cigarettes  . Smokeless tobacco: Former Systems developer    Quit date: 09/28/2013     Comment: pt states that she smokes "every once in a while"  . Alcohol use No     Comment: "quit social drinking years ago"  . Drug use: No  . Sexual activity: No   Other Topics Concern  . Not on file   Social History  Narrative  . No narrative on file     Review of Systems: not available due to patient condition Gen:  HEENT:  CV:  Resp:  GI: GU :  MS:  Derm:   Psych: Heme:  Neuro:  Endocrine  Vital Signs: Blood pressure (!) 117/53, pulse 63, resp. rate (!) 21, SpO2 100 %.   Temperature 96.9, pulse 66, blood pressure 150/80  No intake or output data in the 24 hours ending 11/02/16 1552  Weight trends: There were no vitals filed for this visit.  Physical Exam: General:  chronically ill-appearing, discheveled, obese  HEENT Moist mucous membranes  Neck:  tracheostomy in place  Lungs: Coarse breath sounds bilaterally, right basilar crackles  Heart::  irregular  Abdomen: PEG tube in place, soft, nondistended  Extremities:  no edema  Neurologic: Alert,follows very basic simple commands  Skin: Normal turgor: No acute rashes             Lab results: Basic Metabolic Panel:  Recent Labs Lab 10/31/16 0540 11/01/16 1017 11/02/16 0620  NA 150* 153* 144  K 5.4* 4.1 4.4  CL 110 110 105  CO2 28 31 29   GLUCOSE 266* 249* 323*  BUN 128* 135* 138*  CREATININE 2.16* 2.53* 2.36*  CALCIUM 10.2  10.0 9.3    Liver Function Tests: No results for input(s): AST, ALT, ALKPHOS, BILITOT, PROT, ALBUMIN in the last 168 hours. No results for input(s): LIPASE, AMYLASE in the last 168 hours. No results for input(s): AMMONIA in the last 168 hours.  CBC:  Recent Labs Lab 10/27/16 0710 10/31/16 0540  WBC 11.4* 15.2*  HGB 11.6* 12.8  HCT 39.0 43.4  MCV 98.7 100.2*  PLT 349 231    Cardiac Enzymes: No results for input(s): CKTOTAL, TROPONINI in the last 168 hours.  BNP: Invalid input(s): POCBNP  CBG: No results for input(s): GLUCAP in the last 168 hours.  Microbiology: Recent Results (from the past 720 hour(s))  Aerobic Culture (superficial specimen)     Status: None   Collection Time: 10/18/16  6:30 PM  Result Value Ref Range Status   Specimen Description WOUND  Final    Special Requests [facility] ULCER DISCHARGE  Final   Gram Stain   Final    FEW WBC PRESENT,BOTH PMN AND MONONUCLEAR RARE GRAM NEGATIVE RODS    Culture MULTIPLE ORGANISMS PRESENT, NONE PREDOMINANT  Final   Report Status 10/20/2016 FINAL  Final  Culture, respiratory (NON-Expectorated)     Status: None (Preliminary result)   Collection Time: 10/30/16  1:54 PM  Result Value Ref Range Status   Specimen Description TRACHEAL ASPIRATE  Final   Special Requests Normal  Final   Gram Stain   Final    MODERATE WBC PRESENT, PREDOMINANTLY PMN MODERATE GRAM NEGATIVE RODS RARE GRAM POSITIVE COCCI IN PAIRS RARE GRAM POSITIVE RODS    Culture MODERATE ESCHERICHIA COLI  Final   Report Status PENDING  Incomplete   Organism ID, Bacteria ESCHERICHIA COLI  Final      Susceptibility   Escherichia coli - MIC*    AMPICILLIN 4 SENSITIVE Sensitive     CEFAZOLIN <=4 SENSITIVE Sensitive     CEFEPIME <=1 SENSITIVE Sensitive     CEFTAZIDIME <=1 SENSITIVE Sensitive     CEFTRIAXONE <=1 SENSITIVE Sensitive     CIPROFLOXACIN <=0.25 SENSITIVE Sensitive     GENTAMICIN <=1 SENSITIVE Sensitive     IMIPENEM <=0.25 SENSITIVE Sensitive     TRIMETH/SULFA <=20 SENSITIVE Sensitive     AMPICILLIN/SULBACTAM <=2 SENSITIVE Sensitive     PIP/TAZO <=4 SENSITIVE Sensitive     Extended ESBL NEGATIVE Sensitive     * MODERATE ESCHERICHIA COLI  Culture, Urine     Status: Abnormal   Collection Time: 10/31/16  6:30 PM  Result Value Ref Range Status   Specimen Description URINE, RANDOM  Final   Special Requests NONE  Final   Culture >=100,000 COLONIES/mL PROTEUS MIRABILIS (A)  Final   Report Status 11/02/2016 FINAL  Final   Organism ID, Bacteria PROTEUS MIRABILIS (A)  Final      Susceptibility   Proteus mirabilis - MIC*    AMPICILLIN <=2 SENSITIVE Sensitive     CEFAZOLIN <=4 SENSITIVE Sensitive     CEFTRIAXONE <=1 SENSITIVE Sensitive     CIPROFLOXACIN <=0.25 SENSITIVE Sensitive     GENTAMICIN <=1 SENSITIVE Sensitive     IMIPENEM 4  SENSITIVE Sensitive     NITROFURANTOIN 128 RESISTANT Resistant     TRIMETH/SULFA <=20 SENSITIVE Sensitive     AMPICILLIN/SULBACTAM <=2 SENSITIVE Sensitive     PIP/TAZO <=4 SENSITIVE Sensitive     * >=100,000 COLONIES/mL PROTEUS MIRABILIS     Coagulation Studies: No results for input(s): LABPROT, INR in the last 72 hours.  Urinalysis:  Recent Labs  10/31/16 1830  COLORURINE YELLOW  LABSPEC 1.016  PHURINE 8.5*  GLUCOSEU NEGATIVE  HGBUR LARGE*  BILIRUBINUR NEGATIVE  KETONESUR NEGATIVE  PROTEINUR 100*  NITRITE NEGATIVE  LEUKOCYTESUR LARGE*        Imaging:  No results found.   Assessment & Plan: Pt is a 75 y.o. African American  female with medical problems of coronary artery disease, multiple stents, arrhythmias,hypertension, peripheral vascular disease, pericardial effusion, COPD, cirrhosis of liver, nonalcoholic, chronic back pain, type 2 diabetes with complications,  Admitted with acute left MCA stroke  With hemorrhagic transformation.   1. Acute renal failure - Likely multifactorial including combination of prerenal, ATN from concurrent UTI, azotemia prednisone and high protein supplementation - I agree with tapering prednisone to off as soon as possible - Agree with discontinuing Lasix for now -  Sodium level had initially increased likely secondary to Lasix administration which has been stopped now - Continue tube feeds. - Free water supplementation according to dietitian recommendation  2. Chronic kidney disease stage III - Baseline creatinine 1.31/GFR 45  3. Diabetes type 2 With chronic kidney disease  - blood sugar currently is high - dose of Lantus is being increased by primary team Hemoglobin A1c 6.5% on 10/21/2006  4. Urinary tract infection Proteus mirabilis Treated with IV ceftriaxone   Will follow

## 2016-11-03 LAB — RENAL FUNCTION PANEL
Albumin: 2.4 g/dL — ABNORMAL LOW (ref 3.5–5.0)
Anion gap: 14 (ref 5–15)
BUN: 136 mg/dL — AB (ref 6–20)
CALCIUM: 9.2 mg/dL (ref 8.9–10.3)
CO2: 25 mmol/L (ref 22–32)
CREATININE: 2.26 mg/dL — AB (ref 0.44–1.00)
Chloride: 101 mmol/L (ref 101–111)
GFR calc Af Amer: 23 mL/min — ABNORMAL LOW (ref >60)
GFR, EST NON AFRICAN AMERICAN: 20 mL/min — AB (ref >60)
GLUCOSE: 373 mg/dL — AB (ref 65–99)
PHOSPHORUS: 4.1 mg/dL (ref 2.5–4.6)
POTASSIUM: 4.8 mmol/L (ref 3.5–5.1)
SODIUM: 140 mmol/L (ref 135–145)

## 2016-11-03 LAB — CBC
HCT: 37.2 % (ref 36.0–46.0)
Hemoglobin: 11.4 g/dL — ABNORMAL LOW (ref 12.0–15.0)
MCH: 30.2 pg (ref 26.0–34.0)
MCHC: 30.6 g/dL (ref 30.0–36.0)
MCV: 98.7 fL (ref 78.0–100.0)
PLATELETS: 149 10*3/uL — AB (ref 150–400)
RBC: 3.77 MIL/uL — ABNORMAL LOW (ref 3.87–5.11)
RDW: 15.9 % — ABNORMAL HIGH (ref 11.5–15.5)
WBC: 12.8 10*3/uL — AB (ref 4.0–10.5)

## 2016-11-03 LAB — CULTURE, RESPIRATORY

## 2016-11-03 LAB — CULTURE, RESPIRATORY W GRAM STAIN: Special Requests: NORMAL

## 2016-11-04 LAB — RENAL FUNCTION PANEL
ANION GAP: 12 (ref 5–15)
Albumin: 2.4 g/dL — ABNORMAL LOW (ref 3.5–5.0)
BUN: 125 mg/dL — ABNORMAL HIGH (ref 6–20)
CHLORIDE: 101 mmol/L (ref 101–111)
CO2: 26 mmol/L (ref 22–32)
Calcium: 9.1 mg/dL (ref 8.9–10.3)
Creatinine, Ser: 2.36 mg/dL — ABNORMAL HIGH (ref 0.44–1.00)
GFR calc non Af Amer: 19 mL/min — ABNORMAL LOW (ref >60)
GFR, EST AFRICAN AMERICAN: 22 mL/min — AB (ref >60)
Glucose, Bld: 168 mg/dL — ABNORMAL HIGH (ref 65–99)
POTASSIUM: 4.6 mmol/L (ref 3.5–5.1)
Phosphorus: 3.5 mg/dL (ref 2.5–4.6)
Sodium: 139 mmol/L (ref 135–145)

## 2016-11-04 NOTE — Progress Notes (Signed)
Subjective:   Doing about the same. No acute changes BUN slightly lower today to 125 Creatinine about the same at 2.36  Objective:  Vital signs in last 24 hours:    T 98  P 62  RR 18  BP  118/69 Weight change:  There were no vitals filed for this visit.  Intake/Output:   No intake or output data in the 24 hours ending 11/04/16 0930   Physical Exam: General: Chronically ill appearing, NAD laying in bed  HEENT anicteric  Neck Trach in place  Pulm/lungs Coarse crackles, harsh rhonchi  CVS/Heart irregular  Abdomen:  Soft, NT, non distended  Extremities: No edema  Neurologic: Alert, follows few simple basic commands, small muscle wasting  Skin: No acute rashes          Basic Metabolic Panel:   Recent Labs Lab 10/31/16 0540 11/01/16 1017 11/02/16 0620 11/03/16 0404 11/04/16 0638  NA 150* 153* 144 140 139  K 5.4* 4.1 4.4 4.8 4.6  CL 110 110 105 101 101  CO2 28 31 29 25 26   GLUCOSE 266* 249* 323* 373* 168*  BUN 128* 135* 138* 136* 125*  CREATININE 2.16* 2.53* 2.36* 2.26* 2.36*  CALCIUM 10.2 10.0 9.3 9.2 9.1  PHOS  --   --   --  4.1 3.5     CBC:  Recent Labs Lab 10/31/16 0540 11/03/16 0404  WBC 15.2* 12.8*  HGB 12.8 11.4*  HCT 43.4 37.2  MCV 100.2* 98.7  PLT 231 149*      Microbiology:  Recent Results (from the past 720 hour(s))  Aerobic Culture (superficial specimen)     Status: None   Collection Time: 10/18/16  6:30 PM  Result Value Ref Range Status   Specimen Description WOUND  Final   Special Requests [facility]LCER DISCHARGE  Final   Gram Stain   Final    FEW WBC PRESENT,BOTH PMN AND MONONUCLEAR RARE GRAM NEGATIVE RODS    Culture MULTIPLE ORGANISMS PRESENT, NONE PREDOMINANT  Final   Report Status 10/20/2016 FINAL  Final  Culture, respiratory (NON-Expectorated)     Status: None   Collection Time: 10/30/16  1:54 PM  Result Value Ref Range Status   Specimen Description TRACHEAL ASPIRATE  Final   Special Requests Normal  Final   Gram  Stain   Final    MODERATE WBC PRESENT, PREDOMINANTLY PMN MODERATE GRAM NEGATIVE RODS RARE GRAM POSITIVE COCCI IN PAIRS RARE GRAM POSITIVE RODS    Culture   Final    MODERATE ESCHERICHIA COLI MODERATE PROTEUS MIRABILIS    Report Status 11/03/2016 FINAL  Final   Organism ID, Bacteria ESCHERICHIA COLI  Final   Organism ID, Bacteria PROTEUS MIRABILIS  Final      Susceptibility   Escherichia coli - MIC*    AMPICILLIN 4 SENSITIVE Sensitive     CEFAZOLIN <=4 SENSITIVE Sensitive     CEFEPIME <=1 SENSITIVE Sensitive     CEFTAZIDIME <=1 SENSITIVE Sensitive     CEFTRIAXONE <=1 SENSITIVE Sensitive     CIPROFLOXACIN <=0.25 SENSITIVE Sensitive     GENTAMICIN <=1 SENSITIVE Sensitive     IMIPENEM <=0.25 SENSITIVE Sensitive     TRIMETH/SULFA <=20 SENSITIVE Sensitive     AMPICILLIN/SULBACTAM <=2 SENSITIVE Sensitive     PIP/TAZO <=4 SENSITIVE Sensitive     Extended ESBL NEGATIVE Sensitive     * MODERATE ESCHERICHIA COLI   Proteus mirabilis - MIC*    AMPICILLIN <=2 SENSITIVE Sensitive     CEFAZOLIN <=4 SENSITIVE Sensitive  CEFEPIME <=1 SENSITIVE Sensitive     CEFTAZIDIME <=1 SENSITIVE Sensitive     CEFTRIAXONE <=1 SENSITIVE Sensitive     CIPROFLOXACIN <=0.25 SENSITIVE Sensitive     GENTAMICIN <=1 SENSITIVE Sensitive     IMIPENEM 1 SENSITIVE Sensitive     TRIMETH/SULFA <=20 SENSITIVE Sensitive     AMPICILLIN/SULBACTAM <=2 SENSITIVE Sensitive     PIP/TAZO <=4 SENSITIVE Sensitive     * MODERATE PROTEUS MIRABILIS  Culture, Urine     Status: Abnormal   Collection Time: 10/31/16  6:30 PM  Result Value Ref Range Status   Specimen Description URINE, RANDOM  Final   Special Requests NONE  Final   Culture >=100,000 COLONIES/mL PROTEUS MIRABILIS (A)  Final   Report Status 11/02/2016 FINAL  Final   Organism ID, Bacteria PROTEUS MIRABILIS (A)  Final      Susceptibility   Proteus mirabilis - MIC*    AMPICILLIN <=2 SENSITIVE Sensitive     CEFAZOLIN <=4 SENSITIVE Sensitive     CEFTRIAXONE <=1  SENSITIVE Sensitive     CIPROFLOXACIN <=0.25 SENSITIVE Sensitive     GENTAMICIN <=1 SENSITIVE Sensitive     IMIPENEM 4 SENSITIVE Sensitive     NITROFURANTOIN 128 RESISTANT Resistant     TRIMETH/SULFA <=20 SENSITIVE Sensitive     AMPICILLIN/SULBACTAM <=2 SENSITIVE Sensitive     PIP/TAZO <=4 SENSITIVE Sensitive     * >=100,000 COLONIES/mL PROTEUS MIRABILIS    Coagulation Studies: No results for input(s): LABPROT, INR in the last 72 hours.  Urinalysis: No results for input(s): COLORURINE, LABSPEC, PHURINE, GLUCOSEU, HGBUR, BILIRUBINUR, KETONESUR, PROTEINUR, UROBILINOGEN, NITRITE, LEUKOCYTESUR in the last 72 hours.  Invalid input(s): APPERANCEUR    Imaging: No results found.   Medications:     Assessment/ Plan:  75 y.o. 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   1. Acute renal failure - Likely multifactorial including combination of prerenal, ATN from concurrent UTI, azotemia prednisone and high protein supplementation -  agree with tapering prednisone to off as soon as possible - Agree with discontinuing Lasix for now -  Sodium level had initially increased likely secondary to Lasix administration which has been stopped now - Continue tube feeds. - Maintenance Free water supplementation according to dietitian recommendation  2. Chronic kidney disease stage III - Baseline creatinine 1.31/GFR 45  3. Diabetes type 2 With chronic kidney disease  - blood sugar currently is high - dose of Lantus is being increased by primary team - Hemoglobin A1c 6.5% on 10/21/2006  4. Urinary tract infection Proteus mirabilis Treated with IV ceftriaxone    LOS: 0 Linley Moskal 12/8/20179:30  AM

## 2016-11-07 LAB — BASIC METABOLIC PANEL
ANION GAP: 10 (ref 5–15)
BUN: 102 mg/dL — ABNORMAL HIGH (ref 6–20)
CALCIUM: 9.3 mg/dL (ref 8.9–10.3)
CHLORIDE: 95 mmol/L — AB (ref 101–111)
CO2: 28 mmol/L (ref 22–32)
Creatinine, Ser: 2.02 mg/dL — ABNORMAL HIGH (ref 0.44–1.00)
GFR calc Af Amer: 26 mL/min — ABNORMAL LOW (ref >60)
GFR calc non Af Amer: 23 mL/min — ABNORMAL LOW (ref >60)
GLUCOSE: 100 mg/dL — AB (ref 65–99)
Potassium: 5.2 mmol/L — ABNORMAL HIGH (ref 3.5–5.1)
Sodium: 133 mmol/L — ABNORMAL LOW (ref 135–145)

## 2016-11-07 NOTE — Progress Notes (Signed)
Subjective:  Patient resting comfortably in bed. BUN down to 102 and creatinine is currently 2.02.    Objective:  Vital signs in last 24 hours:  Temperature 97.1 pulse 58 respirations 16 blood pressure 116/82  Physical Exam: General: Chronically ill appearing, NAD laying in bed  HEENT anicteric  Neck Trach in place  Pulm/lungs Bilateral rhonchi, normal effort  CVS/Heart irregular  Abdomen:  Soft, NT, non distended  Extremities: No edema  Neurologic: Awake, alert, follows simple commands  Skin: No acute rashes          Basic Metabolic Panel:   Recent Labs Lab 11/01/16 1017 11/02/16 0620 11/03/16 0404 11/04/16 0638 11/07/16 0735  NA 153* 144 140 139 133*  K 4.1 4.4 4.8 4.6 5.2*  CL 110 105 101 101 95*  CO2 31 29 25 26 28   GLUCOSE 249* 323* 373* 168* 100*  BUN 135* 138* 136* 125* 102*  CREATININE 2.53* 2.36* 2.26* 2.36* 2.02*  CALCIUM 10.0 9.3 9.2 9.1 9.3  PHOS  --   --  4.1 3.5  --      CBC:  Recent Labs Lab 11/03/16 0404  WBC 12.8*  HGB 11.4*  HCT 37.2  MCV 98.7  PLT 149*      Microbiology:  Recent Results (from the past 720 hour(s))  Aerobic Culture (superficial specimen)     Status: None   Collection Time: 10/18/16  6:30 PM  Result Value Ref Range Status   Specimen Description WOUND  Final   Special Requests [facility]LCER DISCHARGE  Final   Gram Stain   Final    FEW WBC PRESENT,BOTH PMN AND MONONUCLEAR RARE GRAM NEGATIVE RODS    Culture MULTIPLE ORGANISMS PRESENT, NONE PREDOMINANT  Final   Report Status 10/20/2016 FINAL  Final  Culture, respiratory (NON-Expectorated)     Status: None   Collection Time: 10/30/16  1:54 PM  Result Value Ref Range Status   Specimen Description TRACHEAL ASPIRATE  Final   Special Requests Normal  Final   Gram Stain   Final    MODERATE WBC PRESENT, PREDOMINANTLY PMN MODERATE GRAM NEGATIVE RODS RARE GRAM POSITIVE COCCI IN PAIRS RARE GRAM POSITIVE RODS    Culture   Final    MODERATE ESCHERICHIA  COLI MODERATE PROTEUS MIRABILIS    Report Status 11/03/2016 FINAL  Final   Organism ID, Bacteria ESCHERICHIA COLI  Final   Organism ID, Bacteria PROTEUS MIRABILIS  Final      Susceptibility   Escherichia coli - MIC*    AMPICILLIN 4 SENSITIVE Sensitive     CEFAZOLIN <=4 SENSITIVE Sensitive     CEFEPIME <=1 SENSITIVE Sensitive     CEFTAZIDIME <=1 SENSITIVE Sensitive     CEFTRIAXONE <=1 SENSITIVE Sensitive     CIPROFLOXACIN <=0.25 SENSITIVE Sensitive     GENTAMICIN <=1 SENSITIVE Sensitive     IMIPENEM <=0.25 SENSITIVE Sensitive     TRIMETH/SULFA <=20 SENSITIVE Sensitive     AMPICILLIN/SULBACTAM <=2 SENSITIVE Sensitive     PIP/TAZO <=4 SENSITIVE Sensitive     Extended ESBL NEGATIVE Sensitive     * MODERATE ESCHERICHIA COLI   Proteus mirabilis - MIC*    AMPICILLIN <=2 SENSITIVE Sensitive     CEFAZOLIN <=4 SENSITIVE Sensitive     CEFEPIME <=1 SENSITIVE Sensitive     CEFTAZIDIME <=1 SENSITIVE Sensitive     CEFTRIAXONE <=1 SENSITIVE Sensitive     CIPROFLOXACIN <=0.25 SENSITIVE Sensitive     GENTAMICIN <=1 SENSITIVE Sensitive     IMIPENEM 1 SENSITIVE Sensitive  TRIMETH/SULFA <=20 SENSITIVE Sensitive     AMPICILLIN/SULBACTAM <=2 SENSITIVE Sensitive     PIP/TAZO <=4 SENSITIVE Sensitive     * MODERATE PROTEUS MIRABILIS  Culture, Urine     Status: Abnormal   Collection Time: 10/31/16  6:30 PM  Result Value Ref Range Status   Specimen Description URINE, RANDOM  Final   Special Requests NONE  Final   Culture >=100,000 COLONIES/mL PROTEUS MIRABILIS (A)  Final   Report Status 11/02/2016 FINAL  Final   Organism ID, Bacteria PROTEUS MIRABILIS (A)  Final      Susceptibility   Proteus mirabilis - MIC*    AMPICILLIN <=2 SENSITIVE Sensitive     CEFAZOLIN <=4 SENSITIVE Sensitive     CEFTRIAXONE <=1 SENSITIVE Sensitive     CIPROFLOXACIN <=0.25 SENSITIVE Sensitive     GENTAMICIN <=1 SENSITIVE Sensitive     IMIPENEM 4 SENSITIVE Sensitive     NITROFURANTOIN 128 RESISTANT Resistant      TRIMETH/SULFA <=20 SENSITIVE Sensitive     AMPICILLIN/SULBACTAM <=2 SENSITIVE Sensitive     PIP/TAZO <=4 SENSITIVE Sensitive     * >=100,000 COLONIES/mL PROTEUS MIRABILIS    Coagulation Studies: No results for input(s): LABPROT, INR in the last 72 hours.  Urinalysis: No results for input(s): COLORURINE, LABSPEC, PHURINE, GLUCOSEU, HGBUR, BILIRUBINUR, KETONESUR, PROTEINUR, UROBILINOGEN, NITRITE, LEUKOCYTESUR in the last 72 hours.  Invalid input(s): APPERANCEUR    Imaging: No results found.   Medications:     Assessment/ Plan:  75 y.o. 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   1. Acute renal failure - Likely multifactorial including combination of prerenal, ATN from concurrent UTI, azotemia prednisone and high protein supplementation -  Renal function appears to be improving slowly. BUN down to 102 and creatinine is currently 2.02. No acute indication for dialysis at the moment.Try to keep euvolemic as possible.  2. Chronic kidney disease stage III - Baseline creatinine 1.31/GFR 45. - patient may develop new baseline.  3. Diabetes type 2 With chronic kidney disease  - blood glucose was 100 today.  4. Urinary tract infection Proteus mirabilis Treated with IV ceftriaxone    LOS: 0 [name] 12/11/20173:58 PM

## 2016-11-08 ENCOUNTER — Other Ambulatory Visit (HOSPITAL_COMMUNITY): Payer: Medicare Other

## 2016-11-09 ENCOUNTER — Other Ambulatory Visit (HOSPITAL_COMMUNITY): Payer: Medicare Other

## 2016-11-09 ENCOUNTER — Ambulatory Visit (INDEPENDENT_AMBULATORY_CARE_PROVIDER_SITE_OTHER): Payer: Medicare Other | Admitting: Podiatry

## 2016-11-09 LAB — BASIC METABOLIC PANEL
Anion gap: 11 (ref 5–15)
BUN: 91 mg/dL — AB (ref 6–20)
CO2: 28 mmol/L (ref 22–32)
Calcium: 9.8 mg/dL (ref 8.9–10.3)
Chloride: 100 mmol/L — ABNORMAL LOW (ref 101–111)
Creatinine, Ser: 2.1 mg/dL — ABNORMAL HIGH (ref 0.44–1.00)
GFR calc Af Amer: 25 mL/min — ABNORMAL LOW (ref >60)
GFR, EST NON AFRICAN AMERICAN: 22 mL/min — AB (ref >60)
Glucose, Bld: 287 mg/dL — ABNORMAL HIGH (ref 65–99)
Potassium: 4.5 mmol/L (ref 3.5–5.1)
SODIUM: 139 mmol/L (ref 135–145)

## 2016-11-09 LAB — CBC
HCT: 33.2 % — ABNORMAL LOW (ref 36.0–46.0)
Hemoglobin: 10.4 g/dL — ABNORMAL LOW (ref 12.0–15.0)
MCH: 31 pg (ref 26.0–34.0)
MCHC: 31.3 g/dL (ref 30.0–36.0)
MCV: 98.8 fL (ref 78.0–100.0)
PLATELETS: 184 10*3/uL (ref 150–400)
RBC: 3.36 MIL/uL — ABNORMAL LOW (ref 3.87–5.11)
RDW: 17.4 % — AB (ref 11.5–15.5)
WBC: 14.6 10*3/uL — AB (ref 4.0–10.5)

## 2016-11-09 NOTE — Progress Notes (Signed)
Subjective:  Patient seen at bedside. She is resting comfortably in bed. BUN down to 91, creatinine relatively stable at 2.1.  Objective:  Vital signs in last 24 hours:  Temperature 97.1 pulse 79 respirations 18 blood pressure 121/75  Physical Exam: General: Chronically ill appearing, NAD laying in bed  HEENT anicteric  Neck Trach in place  Pulm/lungs Bilateral rhonchi, normal effort  CVS/Heart irregular  Abdomen:  Soft, NT, non distended  Extremities: No edema  Neurologic: Awake, alert, follows simple commands  Skin: No acute rashes          Basic Metabolic Panel:   Recent Labs Lab 11/03/16 0404 11/04/16 0638 11/07/16 0735 11/09/16 0837  NA 140 139 133* 139  K 4.8 4.6 5.2* 4.5  CL 101 101 95* 100*  CO2 25 26 28 28   GLUCOSE 373* 168* 100* 287*  BUN 136* 125* 102* 91*  CREATININE 2.26* 2.36* 2.02* 2.10*  CALCIUM 9.2 9.1 9.3 9.8  PHOS 4.1 3.5  --   --      CBC:  Recent Labs Lab 11/03/16 0404 11/09/16 0837  WBC 12.8* 14.6*  HGB 11.4* 10.4*  HCT 37.2 33.2*  MCV 98.7 98.8  PLT 149* 184      Microbiology:  Recent Results (from the past 720 hour(s))  Aerobic Culture (superficial specimen)     Status: None   Collection Time: 10/18/16  6:30 PM  Result Value Ref Range Status   Specimen Description WOUND  Final   Special Requests [facility] ULCER DISCHARGE  Final   Gram Stain   Final    FEW WBC PRESENT,BOTH PMN AND MONONUCLEAR RARE GRAM NEGATIVE RODS    Culture MULTIPLE ORGANISMS PRESENT, NONE PREDOMINANT  Final   Report Status 10/20/2016 FINAL  Final  Culture, respiratory (NON-Expectorated)     Status: None   Collection Time: 10/30/16  1:54 PM  Result Value Ref Range Status   Specimen Description TRACHEAL ASPIRATE  Final   Special Requests Normal  Final   Gram Stain   Final    MODERATE WBC PRESENT, PREDOMINANTLY PMN MODERATE GRAM NEGATIVE RODS RARE GRAM POSITIVE COCCI IN PAIRS RARE GRAM POSITIVE RODS    Culture   Final    MODERATE ESCHERICHIA  COLI MODERATE PROTEUS MIRABILIS    Report Status 11/03/2016 FINAL  Final   Organism ID, Bacteria ESCHERICHIA COLI  Final   Organism ID, Bacteria PROTEUS MIRABILIS  Final      Susceptibility   Escherichia coli - MIC*    AMPICILLIN 4 SENSITIVE Sensitive     CEFAZOLIN <=4 SENSITIVE Sensitive     CEFEPIME <=1 SENSITIVE Sensitive     CEFTAZIDIME <=1 SENSITIVE Sensitive     CEFTRIAXONE <=1 SENSITIVE Sensitive     CIPROFLOXACIN <=0.25 SENSITIVE Sensitive     GENTAMICIN <=1 SENSITIVE Sensitive     IMIPENEM <=0.25 SENSITIVE Sensitive     TRIMETH/SULFA <=20 SENSITIVE Sensitive     AMPICILLIN/SULBACTAM <=2 SENSITIVE Sensitive     PIP/TAZO <=4 SENSITIVE Sensitive     Extended ESBL NEGATIVE Sensitive     * MODERATE ESCHERICHIA COLI   Proteus mirabilis - MIC*    AMPICILLIN <=2 SENSITIVE Sensitive     CEFAZOLIN <=4 SENSITIVE Sensitive     CEFEPIME <=1 SENSITIVE Sensitive     CEFTAZIDIME <=1 SENSITIVE Sensitive     CEFTRIAXONE <=1 SENSITIVE Sensitive     CIPROFLOXACIN <=0.25 SENSITIVE Sensitive     GENTAMICIN <=1 SENSITIVE Sensitive     IMIPENEM 1 SENSITIVE Sensitive  TRIMETH/SULFA <=20 SENSITIVE Sensitive     AMPICILLIN/SULBACTAM <=2 SENSITIVE Sensitive     PIP/TAZO <=4 SENSITIVE Sensitive     * MODERATE PROTEUS MIRABILIS  Culture, Urine     Status: Abnormal   Collection Time: 10/31/16  6:30 PM  Result Value Ref Range Status   Specimen Description URINE, RANDOM  Final   Special Requests NONE  Final   Culture >=100,000 COLONIES/mL PROTEUS MIRABILIS (A)  Final   Report Status 11/02/2016 FINAL  Final   Organism ID, Bacteria PROTEUS MIRABILIS (A)  Final      Susceptibility   Proteus mirabilis - MIC*    AMPICILLIN <=2 SENSITIVE Sensitive     CEFAZOLIN <=4 SENSITIVE Sensitive     CEFTRIAXONE <=1 SENSITIVE Sensitive     CIPROFLOXACIN <=0.25 SENSITIVE Sensitive     GENTAMICIN <=1 SENSITIVE Sensitive     IMIPENEM 4 SENSITIVE Sensitive     NITROFURANTOIN 128 RESISTANT Resistant      TRIMETH/SULFA <=20 SENSITIVE Sensitive     AMPICILLIN/SULBACTAM <=2 SENSITIVE Sensitive     PIP/TAZO <=4 SENSITIVE Sensitive     * >=100,000 COLONIES/mL PROTEUS MIRABILIS    Coagulation Studies: No results for input(s): LABPROT, INR in the last 72 hours.  Urinalysis: No results for input(s): COLORURINE, LABSPEC, PHURINE, GLUCOSEU, HGBUR, BILIRUBINUR, KETONESUR, PROTEINUR, UROBILINOGEN, NITRITE, LEUKOCYTESUR in the last 72 hours.  Invalid input(s): APPERANCEUR    Imaging: US Pelvis Complete  Result Date: 11/09/2016 CLINICAL DATA:  Vaginal bleeding. History of previous hysterectomy. Tracheostomy patient. EXAM: TRANSABDOMINAL ULTRASOUND OF PELVIS TECHNIQUE: Transabdominal ultrasound examination of the pelvis was performed including evaluation of the uterus, ovaries, adnexal regions, and pelvic cul-de-sac. COMPARISON:  Abdominal and pelvic CT scan of May 20, 2014 FINDINGS: Uterus The uterus is surgically absent. Right ovary Not visualized. Left ovary Not visualized. Other findings: No abnormal free fluid. Within the urinary bladder a large soft tissue density mass exhibits heterogeneous echoes is observed. It measures 10.8 x 9.1 x 10.8 cm. IMPRESSION: Nonvisualization of the uterus and ovaries. Abnormal mass which appears to lie within the lumen of the urinary bladder. This could reflect clot or neoplasm. Cystoscopy would be a useful next imaging step. Electronically Signed   By: David  Martinique M.D.   On: 11/09/2016 12:54     Medications:     Assessment/ Plan:  75 y.o. 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   1. Acute renal failure - Likely multifactorial including combination of prerenal, ATN from  concurrent UTI, azotemia prednisone and high protein supplementation -  BUN has come down to 91 however her renal function appears to be stable with a creatinine of 2.1. Unclear as to whether the patient will develop a new baseline.  2. Chronic kidney disease stage III - Baseline creatinine 1.31/GFR 45. - current creatinine remains well aboveaseline.  3. Diabetes type 2 With chronic kidney disease  - blood glucose fluctuating. Serum glucose this a.m. Was 287. Management per hospitalist.  4. Urinary tract infection Proteus mirabilis Treated with IV ceftriaxone    LOS: 0 Eliza Green 12/13/20173:46 PM

## 2016-11-09 NOTE — Progress Notes (Signed)
ERRONEOUS ENCOUNTER NO SHOW  

## 2016-11-10 LAB — HEMOGLOBIN AND HEMATOCRIT, BLOOD
HEMATOCRIT: 28.7 % — AB (ref 36.0–46.0)
Hemoglobin: 8.8 g/dL — ABNORMAL LOW (ref 12.0–15.0)

## 2016-11-10 LAB — URINALYSIS, ROUTINE W REFLEX MICROSCOPIC
Bacteria, UA: NONE SEEN
Squamous Epithelial / LPF: NONE SEEN

## 2016-11-10 NOTE — Progress Notes (Signed)
Patient ID: [name], female   DOB: Apr 06, 1941, 75 y.o.   MRN: [id number]   Erroneous encounter, no-show

## 2016-11-11 LAB — URINE CULTURE: Culture: NO GROWTH

## 2016-11-11 NOTE — Progress Notes (Signed)
Subjective:  No new renal function testing today. Hemoglobin has dropped a bit to 8.8. She is resting comfortably in bed at the moment.  Objective:  Vital signs in last 24 hours:  Pulse 73 respirations 21 blood pressure 121/72 pulse ox 95%  Physical Exam: General: Chronically ill appearing, NAD laying in bed  HEENT anicteric  Neck Trach in place  Pulm/lungs Bilateral rhonchi, normal effort  CVS/Heart irregular  Abdomen:  Soft, NT, non distended  Extremities: No edema  Neurologic: Awake, alert, follows simple commands  Skin: No acute rashes          Basic Metabolic Panel:   Recent Labs Lab 11/07/16 0735 11/09/16 0837  NA 133* 139  K 5.2* 4.5  CL 95* 100*  CO2 28 28  GLUCOSE 100* 287*  BUN 102* 91*  CREATININE 2.02* 2.10*  CALCIUM 9.3 9.8     CBC:  Recent Labs Lab 11/09/16 0837 11/10/16 0428  WBC 14.6*  --   HGB 10.4* 8.8*  HCT 33.2* 28.7*  MCV 98.8  --   PLT 184  --       Microbiology:  Recent Results (from the past 720 hour(s))  Aerobic Culture (superficial specimen)     Status: None   Collection Time: 10/18/16  6:30 PM  Result Value Ref Range Status   Specimen Description WOUND  Final   Special Requests [facility] ULCER DISCHARGE  Final   Gram Stain   Final    FEW WBC PRESENT,BOTH PMN AND MONONUCLEAR RARE GRAM NEGATIVE RODS    Culture MULTIPLE ORGANISMS PRESENT, NONE PREDOMINANT  Final   Report Status 10/20/2016 FINAL  Final  Culture, respiratory (NON-Expectorated)     Status: None   Collection Time: 10/30/16  1:54 PM  Result Value Ref Range Status   Specimen Description TRACHEAL ASPIRATE  Final   Special Requests Normal  Final   Gram Stain   Final    MODERATE WBC PRESENT, PREDOMINANTLY PMN MODERATE GRAM NEGATIVE RODS RARE GRAM POSITIVE COCCI IN PAIRS RARE GRAM POSITIVE RODS    Culture   Final    MODERATE ESCHERICHIA COLI MODERATE PROTEUS MIRABILIS    Report Status 11/03/2016 FINAL  Final   Organism ID, Bacteria ESCHERICHIA COLI   Final   Organism ID, Bacteria PROTEUS MIRABILIS  Final      Susceptibility   Escherichia coli - MIC*    AMPICILLIN 4 SENSITIVE Sensitive     CEFAZOLIN <=4 SENSITIVE Sensitive     CEFEPIME <=1 SENSITIVE Sensitive     CEFTAZIDIME <=1 SENSITIVE Sensitive     CEFTRIAXONE <=1 SENSITIVE Sensitive     CIPROFLOXACIN <=0.25 SENSITIVE Sensitive     GENTAMICIN <=1 SENSITIVE Sensitive     IMIPENEM <=0.25 SENSITIVE Sensitive     TRIMETH/SULFA <=20 SENSITIVE Sensitive     AMPICILLIN/SULBACTAM <=2 SENSITIVE Sensitive     PIP/TAZO <=4 SENSITIVE Sensitive     Extended ESBL NEGATIVE Sensitive     * MODERATE ESCHERICHIA COLI   Proteus mirabilis - MIC*    AMPICILLIN <=2 SENSITIVE Sensitive     CEFAZOLIN <=4 SENSITIVE Sensitive     CEFEPIME <=1 SENSITIVE Sensitive     CEFTAZIDIME <=1 SENSITIVE Sensitive     CEFTRIAXONE <=1 SENSITIVE Sensitive     CIPROFLOXACIN <=0.25 SENSITIVE Sensitive     GENTAMICIN <=1 SENSITIVE Sensitive     IMIPENEM 1 SENSITIVE Sensitive     TRIMETH/SULFA <=20 SENSITIVE Sensitive     AMPICILLIN/SULBACTAM <=2 SENSITIVE Sensitive     PIP/TAZO <=4 SENSITIVE  Sensitive     * MODERATE PROTEUS MIRABILIS  Culture, Urine     Status: Abnormal   Collection Time: 10/31/16  6:30 PM  Result Value Ref Range Status   Specimen Description URINE, RANDOM  Final   Special Requests NONE  Final   Culture >=100,000 COLONIES/mL PROTEUS MIRABILIS (A)  Final   Report Status 11/02/2016 FINAL  Final   Organism ID, Bacteria PROTEUS MIRABILIS (A)  Final      Susceptibility   Proteus mirabilis - MIC*    AMPICILLIN <=2 SENSITIVE Sensitive     CEFAZOLIN <=4 SENSITIVE Sensitive     CEFTRIAXONE <=1 SENSITIVE Sensitive     CIPROFLOXACIN <=0.25 SENSITIVE Sensitive     GENTAMICIN <=1 SENSITIVE Sensitive     IMIPENEM 4 SENSITIVE Sensitive     NITROFURANTOIN 128 RESISTANT Resistant     TRIMETH/SULFA <=20 SENSITIVE Sensitive     AMPICILLIN/SULBACTAM <=2 SENSITIVE Sensitive     PIP/TAZO <=4 SENSITIVE  Sensitive     * >=100,000 COLONIES/mL PROTEUS MIRABILIS    Coagulation Studies: No results for input(s): LABPROT, INR in the last 72 hours.  Urinalysis:  Recent Labs  11/10/16 0525  COLORURINE RED*  LABSPEC TEST NOT REPORTED DUE TO COLOR INTERFERENCE OF URINE PIGMENT  PHURINE TEST NOT REPORTED DUE TO COLOR INTERFERENCE OF URINE PIGMENT  GLUCOSEU TEST NOT REPORTED DUE TO COLOR INTERFERENCE OF URINE PIGMENT*  HGBUR TEST NOT REPORTED DUE TO COLOR INTERFERENCE OF URINE PIGMENT*  BILIRUBINUR TEST NOT REPORTED DUE TO COLOR INTERFERENCE OF URINE PIGMENT*  KETONESUR TEST NOT REPORTED DUE TO COLOR INTERFERENCE OF URINE PIGMENT*  PROTEINUR TEST NOT REPORTED DUE TO COLOR INTERFERENCE OF URINE PIGMENT*  NITRITE TEST NOT REPORTED DUE TO COLOR INTERFERENCE OF URINE PIGMENT*  LEUKOCYTESUR TEST NOT REPORTED DUE TO COLOR INTERFERENCE OF URINE PIGMENT*      Imaging: US Pelvis Complete  Result Date: 11/09/2016 CLINICAL DATA:  Vaginal bleeding. History of previous hysterectomy. Tracheostomy patient. EXAM: TRANSABDOMINAL ULTRASOUND OF PELVIS TECHNIQUE: Transabdominal ultrasound examination of the pelvis was performed including evaluation of the uterus, ovaries, adnexal regions, and pelvic cul-de-sac. COMPARISON:  Abdominal and pelvic CT scan of May 20, 2014 FINDINGS: Uterus The uterus is surgically absent. Right ovary Not visualized. Left ovary Not visualized. Other findings: No abnormal free fluid. Within the urinary bladder a large soft tissue density mass exhibits heterogeneous echoes is observed. It measures 10.8 x 9.1 x 10.8 cm. IMPRESSION: Nonvisualization of the uterus and ovaries. Abnormal mass which appears to lie within the lumen of the urinary bladder. This could reflect clot or neoplasm. Cystoscopy would be a useful next imaging step. Electronically Signed   By: David  Martinique M.D.   On: 11/09/2016 12:54     Medications:     Assessment/ Plan:  75 y.o. 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   1. Acute renal failure - Likely multifactorial including combination of prerenal, ATN from concurrent UTI, azotemia prednisone and high protein supplementation -  No new renal function testing today. Patient has had azotemia which has been decreasing. Recommend continued monitoring of renal function periodically. No acute indication for dialysis at this time.  2. Chronic kidney disease stage III - Baseline creatinine 1.31/GFR 45. - continue to monitor renal function periodically as above.  3. Diabetes type 2  With chronic kidney disease  - patient has high intermittent blood sugars. Management per hospitalist.  4. Urinary tract infection Proteus mirabilis Treated with IV ceftriaxone    LOS: 0 Ember Henrikson 12/15/20178:57 AM

## 2016-11-14 LAB — CBC
HEMATOCRIT: 32 % — AB (ref 36.0–46.0)
Hemoglobin: 9.4 g/dL — ABNORMAL LOW (ref 12.0–15.0)
MCH: 31.1 pg (ref 26.0–34.0)
MCHC: 29.4 g/dL — ABNORMAL LOW (ref 30.0–36.0)
MCV: 106 fL — AB (ref 78.0–100.0)
PLATELETS: 253 10*3/uL (ref 150–400)
RBC: 3.02 MIL/uL — ABNORMAL LOW (ref 3.87–5.11)
RDW: 20.2 % — AB (ref 11.5–15.5)
WBC: 12.6 10*3/uL — AB (ref 4.0–10.5)

## 2016-11-14 LAB — BASIC METABOLIC PANEL
ANION GAP: 11 (ref 5–15)
BUN: 103 mg/dL — ABNORMAL HIGH (ref 6–20)
CALCIUM: 10 mg/dL (ref 8.9–10.3)
CO2: 29 mmol/L (ref 22–32)
CREATININE: 2.57 mg/dL — AB (ref 0.44–1.00)
Chloride: 108 mmol/L (ref 101–111)
GFR, EST AFRICAN AMERICAN: 20 mL/min — AB (ref >60)
GFR, EST NON AFRICAN AMERICAN: 17 mL/min — AB (ref >60)
GLUCOSE: 282 mg/dL — AB (ref 65–99)
Potassium: 4.8 mmol/L (ref 3.5–5.1)
Sodium: 148 mmol/L — ABNORMAL HIGH (ref 135–145)

## 2016-11-14 NOTE — Progress Notes (Signed)
Subjective:    She is resting comfortably in bed at the moment. Follows minimal simple commands  Objective:  Vital signs in last 24 hours:  Pulse 74 respirations 29 blood pressure 145/76 pulse ox 91%  Physical Exam: General: Chronically ill appearing, NAD laying in bed  HEENT anicteric  Neck Trach in place; capped  Pulm/lungs Bilateral rhonchi, normal effort  CVS/Heart irregular  Abdomen:  Soft, NT, non distended  Extremities: No edema  Neurologic: Awake, alert, follows simple basic commands  Skin: No acute rashes   Foley        Basic Metabolic Panel:   Recent Labs Lab 11/09/16 0837 11/14/16 1147  NA 139 148*  K 4.5 4.8  CL 100* 108  CO2 28 29  GLUCOSE 287* 282*  BUN 91* 103*  CREATININE 2.10* 2.57*  CALCIUM 9.8 10.0     CBC:  Recent Labs Lab 11/09/16 0837 11/10/16 0428 11/14/16 1147  WBC 14.6*  --  12.6*  HGB 10.4* 8.8* 9.4*  HCT 33.2* 28.7* 32.0*  MCV 98.8  --  106.0*  PLT 184  --  253      Microbiology:  Recent Results (from the past 720 hour(s))  Aerobic Culture (superficial specimen)     Status: None   Collection Time: 10/18/16  6:30 PM  Result Value Ref Range Status   Specimen Description WOUND  Final   Special Requests [facility]LC[facility] DISCHARGE  Final   Gram Stain   Final    FEW WBC PRESENT,BOTH PMN AND MONONUCLEAR RARE GRAM NEGATIVE RODS    Culture MULTIPLE ORGANISMS PRESENT, NONE PREDOMINANT  Final   Report Status 10/20/2016 FINAL  Final  Culture, respiratory (NON-Expectorated)     Status: None   Collection Time: 10/30/16  1:54 PM  Result Value Ref Range Status   Specimen Description TRACHEAL ASPIRATE  Final   Special Requests Normal  Final   Gram Stain   Final    MODERATE WBC PRESENT, PREDOMINANTLY PMN MODERATE GRAM NEGATIVE RODS RARE GRAM POSITIVE COCCI IN PAIRS RARE GRAM POSITIVE RODS    Culture   Final    MODERATE ESCHERICHIA COLI MODERATE PROTEUS MIRABILIS    Report Status 11/03/2016 FINAL  Final   Organism ID,  Bacteria ESCHERICHIA COLI  Final   Organism ID, Bacteria PROTEUS MIRABILIS  Final      Susceptibility   Escherichia coli - MIC*    AMPICILLIN 4 SENSITIVE Sensitive     CEFAZOLIN <=4 SENSITIVE Sensitive     CEFEPIME <=1 SENSITIVE Sensitive     CEFTAZIDIME <=1 SENSITIVE Sensitive     CEFTRIAXONE <=1 SENSITIVE Sensitive     CIPROFLOXACIN <=0.25 SENSITIVE Sensitive     GENTAMICIN <=1 SENSITIVE Sensitive     IMIPENEM <=0.25 SENSITIVE Sensitive     TRIMETH/SULFA <=20 SENSITIVE Sensitive     AMPICILLIN/SULBACTAM <=2 SENSITIVE Sensitive     PIP/TAZO <=4 SENSITIVE Sensitive     Extended ESBL NEGATIVE Sensitive     * MODERATE ESCHERICHIA COLI   Proteus mirabilis - MIC*    AMPICILLIN <=2 SENSITIVE Sensitive     CEFAZOLIN <=4 SENSITIVE Sensitive     CEFEPIME <=1 SENSITIVE Sensitive     CEFTAZIDIME <=1 SENSITIVE Sensitive     CEFTRIAXONE <=1 SENSITIVE Sensitive     CIPROFLOXACIN <=0.25 SENSITIVE Sensitive     GENTAMICIN <=1 SENSITIVE Sensitive     IMIPENEM 1 SENSITIVE Sensitive     TRIMETH/SULFA <=20 SENSITIVE Sensitive     AMPICILLIN/SULBACTAM <=2 SENSITIVE Sensitive  PIP/TAZO <=4 SENSITIVE Sensitive     * MODERATE PROTEUS MIRABILIS  Culture, Urine     Status: Abnormal   Collection Time: 10/31/16  6:30 PM  Result Value Ref Range Status   Specimen Description URINE, RANDOM  Final   Special Requests NONE  Final   Culture >=100,000 COLONIES/mL PROTEUS MIRABILIS (A)  Final   Report Status 11/02/2016 FINAL  Final   Organism ID, Bacteria PROTEUS MIRABILIS (A)  Final      Susceptibility   Proteus mirabilis - MIC*    AMPICILLIN <=2 SENSITIVE Sensitive     CEFAZOLIN <=4 SENSITIVE Sensitive     CEFTRIAXONE <=1 SENSITIVE Sensitive     CIPROFLOXACIN <=0.25 SENSITIVE Sensitive     GENTAMICIN <=1 SENSITIVE Sensitive     IMIPENEM 4 SENSITIVE Sensitive     NITROFURANTOIN 128 RESISTANT Resistant     TRIMETH/SULFA <=20 SENSITIVE Sensitive     AMPICILLIN/SULBACTAM <=2 SENSITIVE Sensitive      PIP/TAZO <=4 SENSITIVE Sensitive     * >=100,000 COLONIES/mL PROTEUS MIRABILIS  Culture, Urine     Status: None   Collection Time: 11/10/16  5:25 AM  Result Value Ref Range Status   Specimen Description URINE, RANDOM  Final   Special Requests NONE  Final   Culture NO GROWTH  Final   Report Status 11/11/2016 FINAL  Final    Coagulation Studies: No results for input(s): LABPROT, INR in the last 72 hours.  Urinalysis: No results for input(s): COLORURINE, LABSPEC, PHURINE, GLUCOSEU, HGBUR, BILIRUBINUR, KETONESUR, PROTEINUR, UROBILINOGEN, NITRITE, LEUKOCYTESUR in the last 72 hours.  Invalid input(s): APPERANCEUR    Imaging: No results found.   Medications:     Assessment/ Plan:  75 y.o. 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   1. Acute renal failure - Likely multifactorial including combination of prerenal, ATN from concurrent UTI, azotemia prednisone and high protein supplementation -  No new renal function testing today. Patient has had azotemia which has been decreasing. Recommend continued monitoring of renal function periodically. No acute indication for dialysis at this time.  2. Chronic kidney disease stage III - Baseline creatinine 1.31/GFR 45. - continue to monitor renal function periodically as above.  3. Diabetes type 2 With chronic kidney disease  - patient has high intermittent blood sugars. Management per hospitalist.  4. Hypernatremia - Add free water 200 cc 4 times per day    LOS: 0 [name] 12/18/20174:16 PM

## 2016-11-15 LAB — CBC
HCT: 31 % — ABNORMAL LOW (ref 36.0–46.0)
HEMOGLOBIN: 9 g/dL — AB (ref 12.0–15.0)
MCH: 30.8 pg (ref 26.0–34.0)
MCHC: 29 g/dL — ABNORMAL LOW (ref 30.0–36.0)
MCV: 106.2 fL — AB (ref 78.0–100.0)
PLATELETS: 251 10*3/uL (ref 150–400)
RBC: 2.92 MIL/uL — AB (ref 3.87–5.11)
RDW: 19.7 % — ABNORMAL HIGH (ref 11.5–15.5)
WBC: 11.3 10*3/uL — AB (ref 4.0–10.5)

## 2016-11-15 LAB — BASIC METABOLIC PANEL
ANION GAP: 9 (ref 5–15)
BUN: 102 mg/dL — ABNORMAL HIGH (ref 6–20)
CHLORIDE: 110 mmol/L (ref 101–111)
CO2: 28 mmol/L (ref 22–32)
Calcium: 9.7 mg/dL (ref 8.9–10.3)
Creatinine, Ser: 2.51 mg/dL — ABNORMAL HIGH (ref 0.44–1.00)
GFR calc Af Amer: 20 mL/min — ABNORMAL LOW (ref >60)
GFR, EST NON AFRICAN AMERICAN: 18 mL/min — AB (ref >60)
GLUCOSE: 206 mg/dL — AB (ref 65–99)
POTASSIUM: 5.4 mmol/L — AB (ref 3.5–5.1)
Sodium: 147 mmol/L — ABNORMAL HIGH (ref 135–145)

## 2016-11-16 LAB — POTASSIUM: Potassium: 3.6 mmol/L (ref 3.5–5.1)

## 2016-11-16 NOTE — Progress Notes (Signed)
Subjective:    Sleepy this morning Did not wake up to voice or tactile stimulation  Objective:  Vital signs in last 24 hours:  Pulse 80 respirations 20 blood pressure 162/84    Physical Exam: General: Chronically ill appearing, NAD laying in bed  HEENT anicteric  Neck Trach in place; capped  Pulm/lungs  normal effort  CVS/Heart irregular  Abdomen:  Soft, NT, non distended  Extremities: No edema  Neurologic: Resting quietly this morning  Skin: No acute rashes            Basic Metabolic Panel:   Recent Labs Lab 11/14/16 1147 11/15/16 0549 11/16/16 0524  NA 148* 147*  --   K 4.8 5.4* 3.6  CL 108 110  --   CO2 29 28  --   GLUCOSE 282* 206*  --   BUN 103* 102*  --   CREATININE 2.57* 2.51*  --   CALCIUM 10.0 9.7  --      CBC:  Recent Labs Lab 11/10/16 0428 11/14/16 1147 11/15/16 0549  WBC  --  12.6* 11.3*  HGB 8.8* 9.4* 9.0*  HCT 28.7* 32.0* 31.0*  MCV  --  106.0* 106.2*  PLT  --  253 251      Microbiology:  Recent Results (from the past 720 hour(s))  Aerobic Culture (superficial specimen)     Status: None   Collection Time: 10/18/16  6:30 PM  Result Value Ref Range Status   Specimen Description WOUND  Final   Special Requests [facility] ULCER DISCHARGE  Final   Gram Stain   Final    FEW WBC PRESENT,BOTH PMN AND MONONUCLEAR RARE GRAM NEGATIVE RODS    Culture MULTIPLE ORGANISMS PRESENT, NONE PREDOMINANT  Final   Report Status 10/20/2016 FINAL  Final  Culture, respiratory (NON-Expectorated)     Status: None   Collection Time: 10/30/16  1:54 PM  Result Value Ref Range Status   Specimen Description TRACHEAL ASPIRATE  Final   Special Requests Normal  Final   Gram Stain   Final    MODERATE WBC PRESENT, PREDOMINANTLY PMN MODERATE GRAM NEGATIVE RODS RARE GRAM POSITIVE COCCI IN PAIRS RARE GRAM POSITIVE RODS    Culture   Final    MODERATE ESCHERICHIA COLI MODERATE PROTEUS MIRABILIS    Report Status 11/03/2016 FINAL  Final   Organism ID, Bacteria  ESCHERICHIA COLI  Final   Organism ID, Bacteria PROTEUS MIRABILIS  Final      Susceptibility   Escherichia coli - MIC*    AMPICILLIN 4 SENSITIVE Sensitive     CEFAZOLIN <=4 SENSITIVE Sensitive     CEFEPIME <=1 SENSITIVE Sensitive     CEFTAZIDIME <=1 SENSITIVE Sensitive     CEFTRIAXONE <=1 SENSITIVE Sensitive     CIPROFLOXACIN <=0.25 SENSITIVE Sensitive     GENTAMICIN <=1 SENSITIVE Sensitive     IMIPENEM <=0.25 SENSITIVE Sensitive     TRIMETH/SULFA <=20 SENSITIVE Sensitive     AMPICILLIN/SULBACTAM <=2 SENSITIVE Sensitive     PIP/TAZO <=4 SENSITIVE Sensitive     Extended ESBL NEGATIVE Sensitive     * MODERATE ESCHERICHIA COLI   Proteus mirabilis - MIC*    AMPICILLIN <=2 SENSITIVE Sensitive     CEFAZOLIN <=4 SENSITIVE Sensitive     CEFEPIME <=1 SENSITIVE Sensitive     CEFTAZIDIME <=1 SENSITIVE Sensitive     CEFTRIAXONE <=1 SENSITIVE Sensitive     CIPROFLOXACIN <=0.25 SENSITIVE Sensitive     GENTAMICIN <=1 SENSITIVE Sensitive     IMIPENEM 1 SENSITIVE Sensitive  TRIMETH/SULFA <=20 SENSITIVE Sensitive     AMPICILLIN/SULBACTAM <=2 SENSITIVE Sensitive     PIP/TAZO <=4 SENSITIVE Sensitive     * MODERATE PROTEUS MIRABILIS  Culture, Urine     Status: Abnormal   Collection Time: 10/31/16  6:30 PM  Result Value Ref Range Status   Specimen Description URINE, RANDOM  Final   Special Requests NONE  Final   Culture >=100,000 COLONIES/mL PROTEUS MIRABILIS (A)  Final   Report Status 11/02/2016 FINAL  Final   Organism ID, Bacteria PROTEUS MIRABILIS (A)  Final      Susceptibility   Proteus mirabilis - MIC*    AMPICILLIN <=2 SENSITIVE Sensitive     CEFAZOLIN <=4 SENSITIVE Sensitive     CEFTRIAXONE <=1 SENSITIVE Sensitive     CIPROFLOXACIN <=0.25 SENSITIVE Sensitive     GENTAMICIN <=1 SENSITIVE Sensitive     IMIPENEM 4 SENSITIVE Sensitive     NITROFURANTOIN 128 RESISTANT Resistant     TRIMETH/SULFA <=20 SENSITIVE Sensitive     AMPICILLIN/SULBACTAM <=2 SENSITIVE Sensitive     PIP/TAZO <=4  SENSITIVE Sensitive     * >=100,000 COLONIES/mL PROTEUS MIRABILIS  Culture, Urine     Status: None   Collection Time: 11/10/16  5:25 AM  Result Value Ref Range Status   Specimen Description URINE, RANDOM  Final   Special Requests NONE  Final   Culture NO GROWTH  Final   Report Status 11/11/2016 FINAL  Final    Coagulation Studies: No results for input(s): LABPROT, INR in the last 72 hours.  Urinalysis: No results for input(s): COLORURINE, LABSPEC, PHURINE, GLUCOSEU, HGBUR, BILIRUBINUR, KETONESUR, PROTEINUR, UROBILINOGEN, NITRITE, LEUKOCYTESUR in the last 72 hours.  Invalid input(s): APPERANCEUR    Imaging: No results found.   Medications:     Assessment/ Plan:  75 y.o. 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   1. Acute renal failure - Likely multifactorial including combination of prerenal, ATN from concurrent UTI, azotemia prednisone and high protein supplementation -  BUN/Cr remain critically high - BUN 102, Cr 2.51 yesterday -  Recommend continued monitoring of renal function periodically. No acute indication for dialysis at this time.  2. Chronic kidney disease stage III - Baseline creatinine 1.31/GFR 45. - continue to monitor renal function periodically as above.  3. Diabetes type 2 With chronic kidney disease  - patient has high intermittent blood sugars. Management per hospitalist.  4. Hypernatremia - continue free water 200 cc 4 times per day - monitor BMP daily    LOS: 0 [name] 12/20/20179:34 AM

## 2016-11-17 LAB — BASIC METABOLIC PANEL
ANION GAP: 9 (ref 5–15)
BUN: 109 mg/dL — AB (ref 6–20)
CALCIUM: 9.4 mg/dL (ref 8.9–10.3)
CO2: 30 mmol/L (ref 22–32)
Chloride: 111 mmol/L (ref 101–111)
Creatinine, Ser: 2.53 mg/dL — ABNORMAL HIGH (ref 0.44–1.00)
GFR calc Af Amer: 20 mL/min — ABNORMAL LOW (ref >60)
GFR, EST NON AFRICAN AMERICAN: 17 mL/min — AB (ref >60)
GLUCOSE: 189 mg/dL — AB (ref 65–99)
POTASSIUM: 3.8 mmol/L (ref 3.5–5.1)
SODIUM: 150 mmol/L — AB (ref 135–145)

## 2016-11-18 ENCOUNTER — Other Ambulatory Visit: Payer: Self-pay

## 2016-11-18 MED ORDER — MODAFINIL 100 MG PO TABS: ORAL_TABLET | ORAL | Status: AC

## 2016-11-28 DEATH — deceased

## 2017-02-18 IMAGING — US US PELVIS COMPLETE
1 series · 14 of 25 positions shown · non-contrast
Comparison: Abdominal and pelvic CT scan May 20, 2014

CLINICAL DATA: Vaginal bleeding. History of previous hysterectomy.
Tracheostomy patient.

EXAM:
TRANSABDOMINAL ULTRASOUND OF PELVIS
TECHNIQUE: Transabdominal ultrasound examination of the pelvis was performed
including evaluation of the uterus, ovaries, adnexal regions, and
pelvic cul-de-sac.

[Series 1: us pelvis complete · 0.25mm/px · 28 acquisitions, 14 frames shown]
[im 1/28]
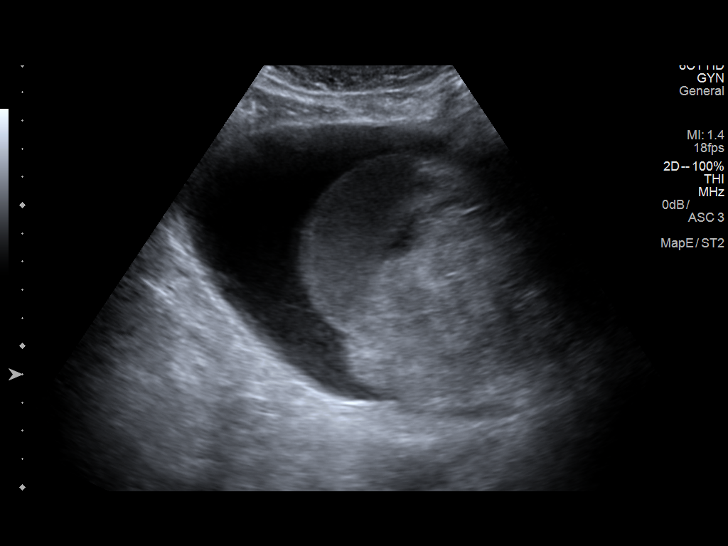
[im 3/28]
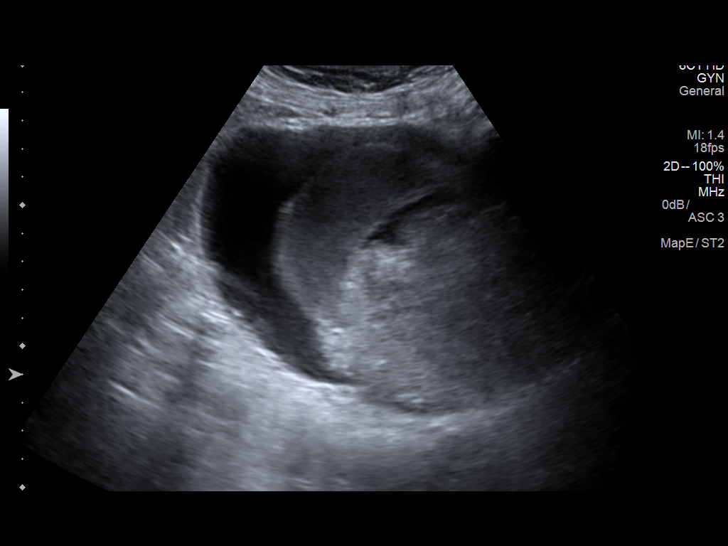
[im 5/28]
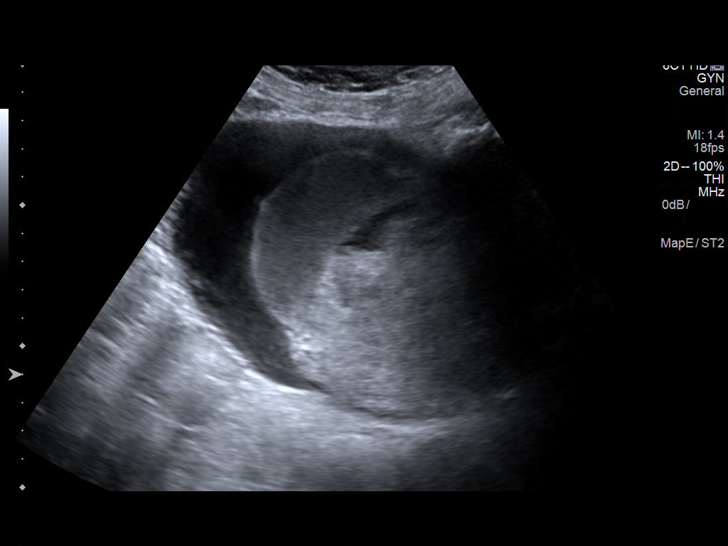
[im 7/28]
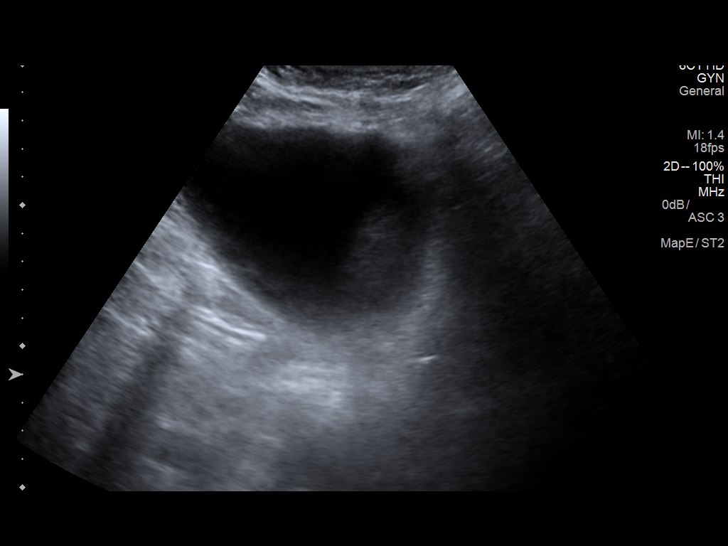
[im 10/28]
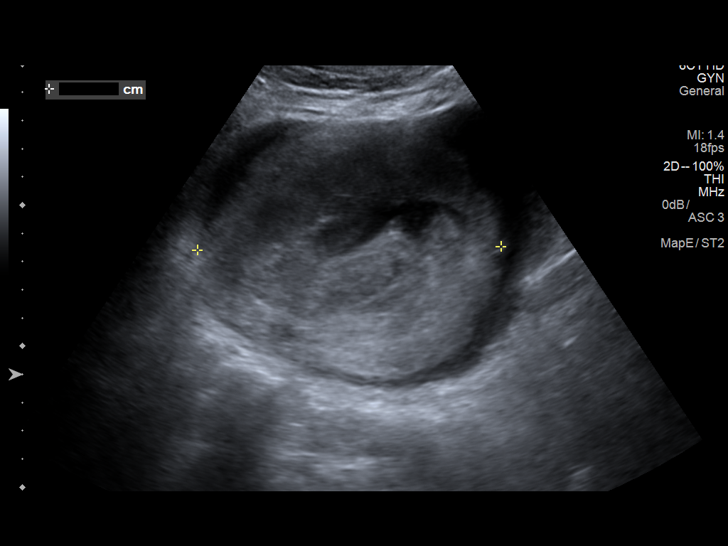
[im 11/28]
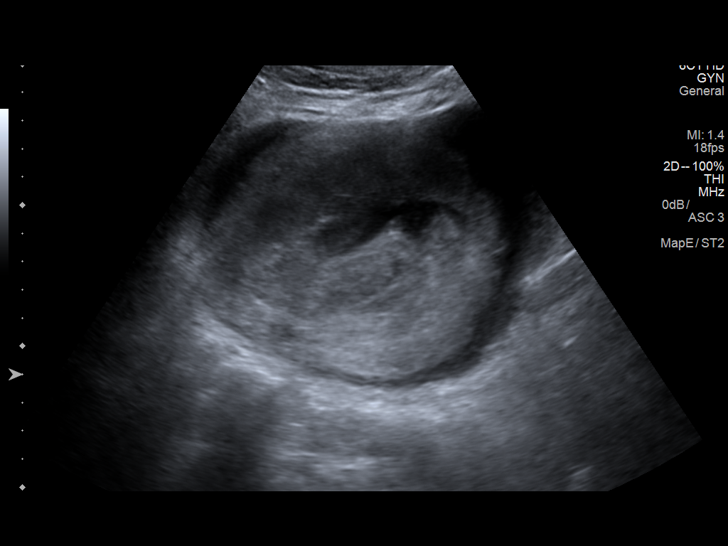
[im 13/28]
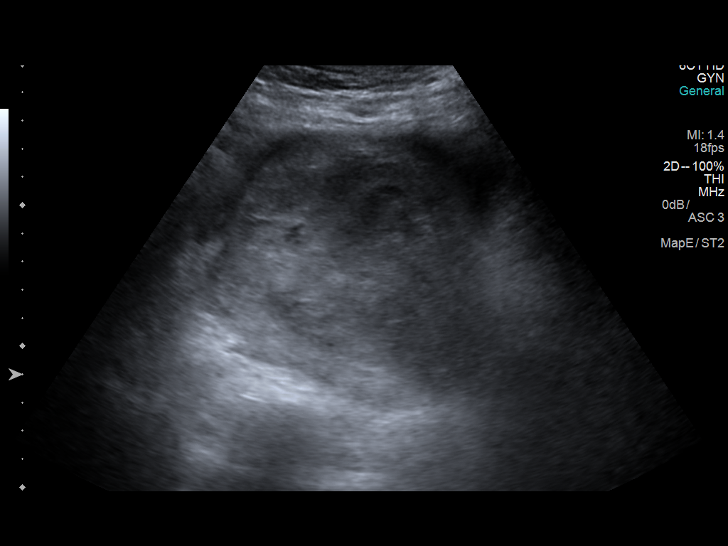
[im 15/28]
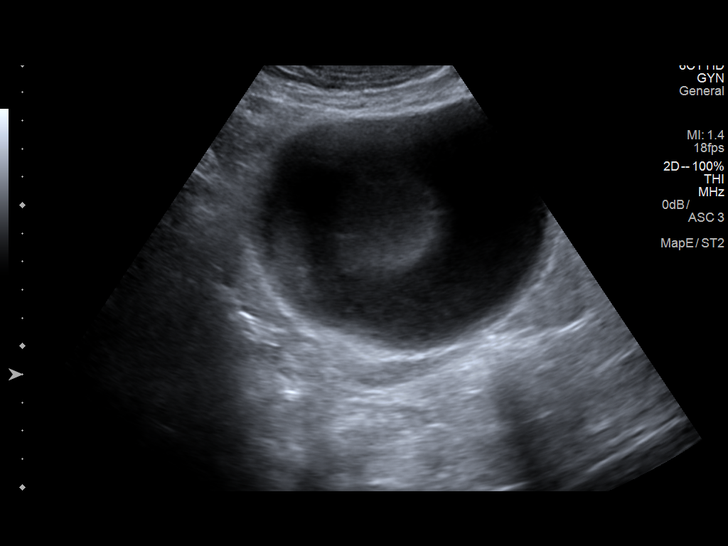
[im 17/28]
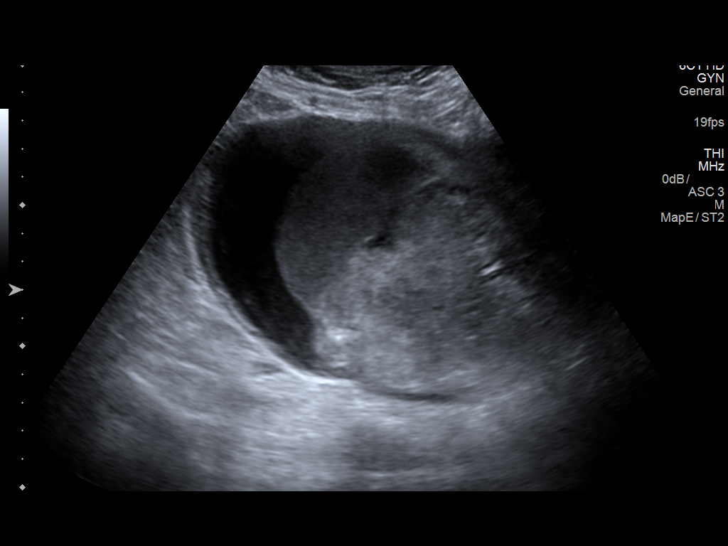
[im 19/28]
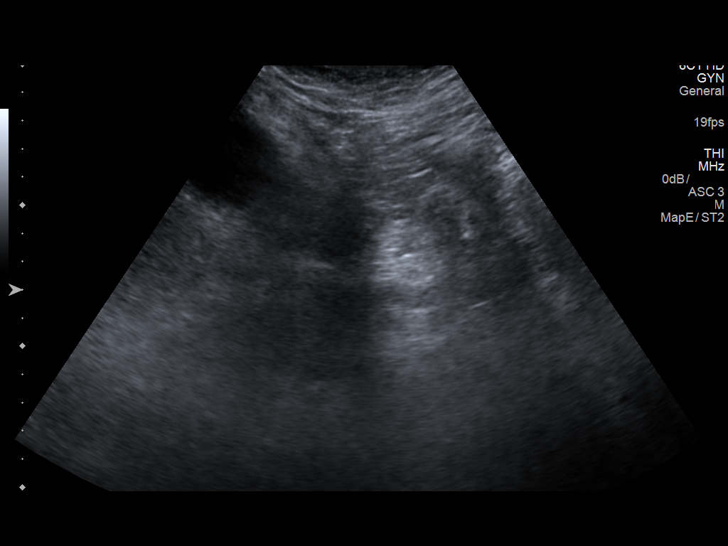
[im 21/28]
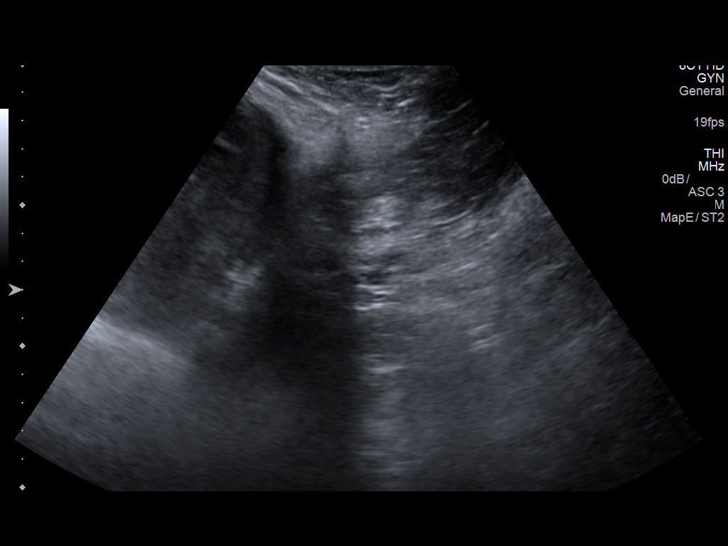
[im 23/28]
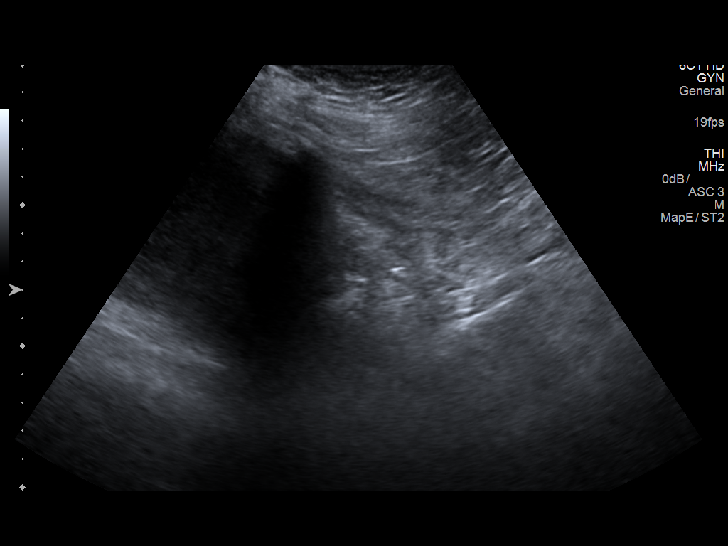
[im 25/28]
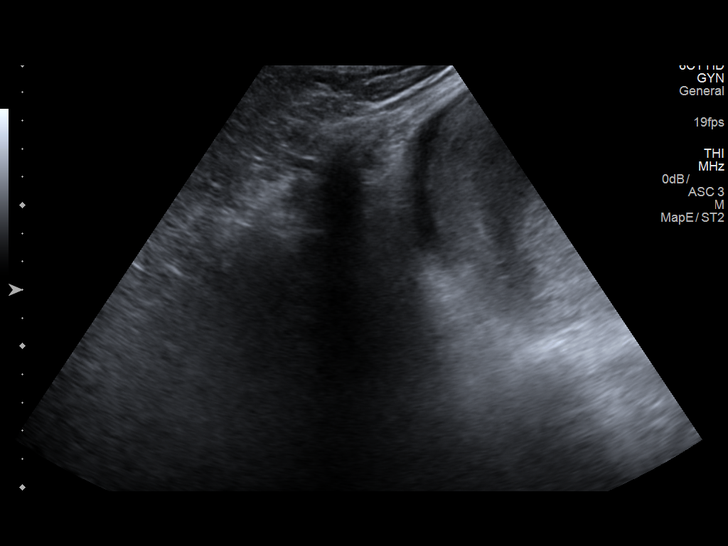
[im 28/28]
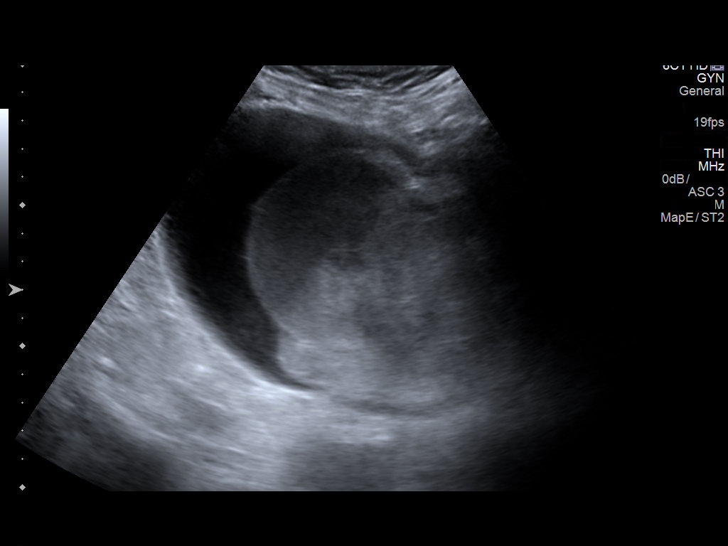

[14 of 25 positions shown; findings below may reference images not displayed]

FINDINGS: Uterus

The uterus is surgically absent.

Right ovary

Not visualized.

Left ovary

Not visualized.

Other findings: No abnormal free fluid. Within the urinary bladder a
large soft tissue density mass exhibits heterogeneous echoes is
observed. It measures 10.8 x 9.1 x 10.8 cm.
IMPRESSION: Nonvisualization of the uterus and ovaries. Abnormal mass which
appears to lie within the lumen of the urinary bladder. This could
reflect clot or neoplasm. Cystoscopy would be a useful next imaging
step.
